# Patient Record
Sex: Male | Born: 1955 | Race: White | Hispanic: No | Smoking: Never smoker
Health system: Southern US, Community
[De-identification: ages and names within clinical notes are randomized; demographics above are authoritative.]

## PROBLEM LIST (undated history)

## (undated) DIAGNOSIS — J309 Allergic rhinitis, unspecified: Secondary | ICD-10-CM

## (undated) DIAGNOSIS — I639 Cerebral infarction, unspecified: Secondary | ICD-10-CM

## (undated) DIAGNOSIS — E669 Obesity, unspecified: Secondary | ICD-10-CM

## (undated) DIAGNOSIS — I10 Essential (primary) hypertension: Secondary | ICD-10-CM

## (undated) DIAGNOSIS — D6869 Other thrombophilia: Secondary | ICD-10-CM

## (undated) DIAGNOSIS — E782 Mixed hyperlipidemia: Secondary | ICD-10-CM

## (undated) DIAGNOSIS — I69359 Hemiplegia and hemiparesis following cerebral infarction affecting unspecified side: Secondary | ICD-10-CM

## (undated) DIAGNOSIS — I499 Cardiac arrhythmia, unspecified: Secondary | ICD-10-CM

## (undated) DIAGNOSIS — F32A Depression, unspecified: Secondary | ICD-10-CM

## (undated) DIAGNOSIS — I219 Acute myocardial infarction, unspecified: Secondary | ICD-10-CM

## (undated) DIAGNOSIS — E1140 Type 2 diabetes mellitus with diabetic neuropathy, unspecified: Secondary | ICD-10-CM

## (undated) DIAGNOSIS — I4891 Unspecified atrial fibrillation: Secondary | ICD-10-CM

## (undated) DIAGNOSIS — I480 Paroxysmal atrial fibrillation: Secondary | ICD-10-CM

## (undated) DIAGNOSIS — E876 Hypokalemia: Secondary | ICD-10-CM

## (undated) DIAGNOSIS — I4819 Other persistent atrial fibrillation: Secondary | ICD-10-CM

## (undated) DIAGNOSIS — I2510 Atherosclerotic heart disease of native coronary artery without angina pectoris: Secondary | ICD-10-CM

## (undated) DIAGNOSIS — C801 Malignant (primary) neoplasm, unspecified: Secondary | ICD-10-CM

## (undated) DIAGNOSIS — U071 COVID-19: Secondary | ICD-10-CM

## (undated) DIAGNOSIS — M1990 Unspecified osteoarthritis, unspecified site: Secondary | ICD-10-CM

## (undated) DIAGNOSIS — E7800 Pure hypercholesterolemia, unspecified: Secondary | ICD-10-CM

## (undated) DIAGNOSIS — K625 Hemorrhage of anus and rectum: Secondary | ICD-10-CM

## (undated) DIAGNOSIS — F419 Anxiety disorder, unspecified: Secondary | ICD-10-CM

## (undated) DIAGNOSIS — I5030 Unspecified diastolic (congestive) heart failure: Secondary | ICD-10-CM

## (undated) HISTORY — DX: Allergic rhinitis, unspecified: J30.9

## (undated) HISTORY — DX: Hemorrhage of anus and rectum: K62.5

## (undated) HISTORY — DX: Essential (primary) hypertension: I10

## (undated) HISTORY — DX: Other persistent atrial fibrillation: I48.19

## (undated) HISTORY — DX: Type 2 diabetes mellitus with diabetic neuropathy, unspecified: E11.40

## (undated) HISTORY — DX: Obesity, unspecified: E66.9

## (undated) HISTORY — PX: CORONARY ANGIOPLASTY: SHX604

## (undated) HISTORY — DX: Depression, unspecified: F32.A

## (undated) HISTORY — DX: Mixed hyperlipidemia: E78.2

## (undated) HISTORY — DX: COVID-19: U07.1

## (undated) HISTORY — DX: Hemiplegia and hemiparesis following cerebral infarction affecting unspecified side: I69.359

## (undated) HISTORY — DX: Other thrombophilia: D68.69

## (undated) HISTORY — DX: Acute myocardial infarction, unspecified: I21.9

## (undated) HISTORY — DX: Hypokalemia: E87.6

## (undated) HISTORY — DX: Malignant (primary) neoplasm, unspecified: C80.1

## (undated) HISTORY — DX: Anxiety disorder, unspecified: F41.9

## (undated) HISTORY — DX: Atherosclerotic heart disease of native coronary artery without angina pectoris: I25.10

## (undated) HISTORY — DX: Unspecified diastolic (congestive) heart failure: I50.30

## (undated) HISTORY — DX: Paroxysmal atrial fibrillation: I48.0

## (undated) HISTORY — DX: Unspecified osteoarthritis, unspecified site: M19.90

## (undated) HISTORY — DX: Pure hypercholesterolemia, unspecified: E78.00

---
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
## 2019-05-01 DIAGNOSIS — C189 Malignant neoplasm of colon, unspecified: Secondary | ICD-10-CM

## 2019-05-01 HISTORY — DX: Malignant neoplasm of colon, unspecified: C18.9

## 2019-06-02 ENCOUNTER — Other Ambulatory Visit: Payer: Self-pay | Admitting: General Surgery

## 2019-06-02 NOTE — Progress Notes (Signed)
errror

## 2019-06-04 ENCOUNTER — Emergency Department (HOSPITAL_COMMUNITY): Payer: Medicare HMO

## 2019-06-04 ENCOUNTER — Ambulatory Visit: Payer: Medicare HMO | Admitting: Physician Assistant

## 2019-06-04 ENCOUNTER — Encounter (HOSPITAL_COMMUNITY): Payer: Self-pay | Admitting: Cardiology

## 2019-06-04 ENCOUNTER — Other Ambulatory Visit: Payer: Self-pay

## 2019-06-04 ENCOUNTER — Inpatient Hospital Stay (HOSPITAL_COMMUNITY)
Admission: EM | Admit: 2019-06-04 | Discharge: 2019-06-06 | DRG: 246 | Disposition: A | Discharge status: Home / Self Care | Payer: Medicare HMO | Attending: Cardiology | Admitting: Cardiology

## 2019-06-04 ENCOUNTER — Encounter: Payer: Self-pay | Admitting: Physician Assistant

## 2019-06-04 ENCOUNTER — Other Ambulatory Visit: Payer: Medicare HMO

## 2019-06-04 ENCOUNTER — Encounter (HOSPITAL_COMMUNITY): Payer: Self-pay

## 2019-06-04 VITALS — HR 196 | Temp 98.2°F | Ht 68.0 in | Wt 215.5 lb

## 2019-06-04 DIAGNOSIS — Z8249 Family history of ischemic heart disease and other diseases of the circulatory system: Secondary | ICD-10-CM

## 2019-06-04 DIAGNOSIS — E785 Hyperlipidemia, unspecified: Secondary | ICD-10-CM

## 2019-06-04 DIAGNOSIS — Z79899 Other long term (current) drug therapy: Secondary | ICD-10-CM | POA: Diagnosis not present

## 2019-06-04 DIAGNOSIS — Z7901 Long term (current) use of anticoagulants: Secondary | ICD-10-CM | POA: Diagnosis not present

## 2019-06-04 DIAGNOSIS — R9431 Abnormal electrocardiogram [ECG] [EKG]: Secondary | ICD-10-CM

## 2019-06-04 DIAGNOSIS — K625 Hemorrhage of anus and rectum: Secondary | ICD-10-CM | POA: Diagnosis not present

## 2019-06-04 DIAGNOSIS — E782 Mixed hyperlipidemia: Secondary | ICD-10-CM | POA: Diagnosis present

## 2019-06-04 DIAGNOSIS — Z87891 Personal history of nicotine dependence: Secondary | ICD-10-CM

## 2019-06-04 DIAGNOSIS — I2510 Atherosclerotic heart disease of native coronary artery without angina pectoris: Secondary | ICD-10-CM | POA: Diagnosis present

## 2019-06-04 DIAGNOSIS — I472 Ventricular tachycardia, unspecified: Secondary | ICD-10-CM

## 2019-06-04 DIAGNOSIS — Z20822 Contact with and (suspected) exposure to covid-19: Secondary | ICD-10-CM | POA: Diagnosis present

## 2019-06-04 DIAGNOSIS — I480 Paroxysmal atrial fibrillation: Secondary | ICD-10-CM | POA: Diagnosis present

## 2019-06-04 DIAGNOSIS — Z955 Presence of coronary angioplasty implant and graft: Secondary | ICD-10-CM | POA: Diagnosis not present

## 2019-06-04 DIAGNOSIS — I214 Non-ST elevation (NSTEMI) myocardial infarction: Secondary | ICD-10-CM

## 2019-06-04 DIAGNOSIS — I255 Ischemic cardiomyopathy: Secondary | ICD-10-CM | POA: Diagnosis present

## 2019-06-04 DIAGNOSIS — I152 Hypertension secondary to endocrine disorders: Secondary | ICD-10-CM

## 2019-06-04 DIAGNOSIS — I252 Old myocardial infarction: Secondary | ICD-10-CM | POA: Diagnosis not present

## 2019-06-04 DIAGNOSIS — I25110 Atherosclerotic heart disease of native coronary artery with unstable angina pectoris: Secondary | ICD-10-CM | POA: Diagnosis not present

## 2019-06-04 DIAGNOSIS — E1159 Type 2 diabetes mellitus with other circulatory complications: Secondary | ICD-10-CM

## 2019-06-04 DIAGNOSIS — I4891 Unspecified atrial fibrillation: Secondary | ICD-10-CM

## 2019-06-04 DIAGNOSIS — R778 Other specified abnormalities of plasma proteins: Secondary | ICD-10-CM

## 2019-06-04 DIAGNOSIS — E1140 Type 2 diabetes mellitus with diabetic neuropathy, unspecified: Secondary | ICD-10-CM | POA: Diagnosis present

## 2019-06-04 DIAGNOSIS — I10 Essential (primary) hypertension: Secondary | ICD-10-CM | POA: Diagnosis not present

## 2019-06-04 DIAGNOSIS — R000 Tachycardia, unspecified: Secondary | ICD-10-CM

## 2019-06-04 HISTORY — DX: Tachycardia, unspecified: R00.0

## 2019-06-04 HISTORY — DX: Ventricular tachycardia: I47.2

## 2019-06-04 LAB — COMPREHENSIVE METABOLIC PANEL
ALT: 18 U/L (ref 0–44)
AST: 19 U/L (ref 15–41)
Albumin: 3.7 g/dL (ref 3.5–5.0)
Alkaline Phosphatase: 48 U/L (ref 38–126)
Anion gap: 13 (ref 5–15)
BUN: 24 mg/dL — ABNORMAL HIGH (ref 8–23)
CO2: 20 mmol/L — ABNORMAL LOW (ref 22–32)
Calcium: 9 mg/dL (ref 8.9–10.3)
Chloride: 107 mmol/L (ref 98–111)
Creatinine, Ser: 1.43 mg/dL — ABNORMAL HIGH (ref 0.61–1.24)
GFR calc Af Amer: 59 mL/min — ABNORMAL LOW (ref >60)
GFR calc non Af Amer: 51 mL/min — ABNORMAL LOW (ref >60)
Glucose, Bld: 227 mg/dL — ABNORMAL HIGH (ref 70–99)
Potassium: 3.8 mmol/L (ref 3.5–5.1)
Sodium: 140 mmol/L (ref 135–145)
Total Bilirubin: 1 mg/dL (ref 0.3–1.2)
Total Protein: 6.6 g/dL (ref 6.5–8.1)

## 2019-06-04 LAB — CBC
HCT: 36.6 % — ABNORMAL LOW (ref 39.0–52.0)
Hemoglobin: 12.1 g/dL — ABNORMAL LOW (ref 13.0–17.0)
MCH: 28.1 pg (ref 26.0–34.0)
MCHC: 33.1 g/dL (ref 30.0–36.0)
MCV: 84.9 fL (ref 80.0–100.0)
Platelets: 346 10*3/uL (ref 150–400)
RBC: 4.31 MIL/uL (ref 4.22–5.81)
RDW: 16.5 % — ABNORMAL HIGH (ref 11.5–15.5)
WBC: 12 10*3/uL — ABNORMAL HIGH (ref 4.0–10.5)
nRBC: 0 % (ref 0.0–0.2)

## 2019-06-04 LAB — HEMOGLOBIN A1C
Hgb A1c MFr Bld: 5.4 % (ref 4.8–5.6)
Mean Plasma Glucose: 108.28 mg/dL

## 2019-06-04 LAB — ECHOCARDIOGRAM COMPLETE
Height: 70 in
Weight: 3344 oz

## 2019-06-04 LAB — HIV ANTIBODY (ROUTINE TESTING W REFLEX): HIV Screen 4th Generation wRfx: NONREACTIVE

## 2019-06-04 LAB — MAGNESIUM: Magnesium: 2.1 mg/dL (ref 1.7–2.4)

## 2019-06-04 LAB — RESPIRATORY PANEL BY RT PCR (FLU A&B, COVID)
Influenza A by PCR: NEGATIVE
Influenza B by PCR: NEGATIVE
SARS Coronavirus 2 by RT PCR: NEGATIVE

## 2019-06-04 LAB — TROPONIN I (HIGH SENSITIVITY)
Troponin I (High Sensitivity): 35 ng/L — ABNORMAL HIGH (ref <18)
Troponin I (High Sensitivity): 362 ng/L (ref <18)

## 2019-06-04 LAB — TSH: TSH: 0.729 u[IU]/mL (ref 0.350–4.500)

## 2019-06-04 MED ORDER — SODIUM CHLORIDE 0.9 % IV SOLN: INTRAVENOUS | Status: DC

## 2019-06-04 MED ORDER — ASPIRIN 300 MG RE SUPP: 300.0000 mg | RECTAL | Status: DC

## 2019-06-04 MED ORDER — ASPIRIN 81 MG PO CHEW
324.0000 mg | CHEWABLE_TABLET | ORAL | Status: DC
  Filled 2019-06-04: qty 4

## 2019-06-04 MED ORDER — DILTIAZEM HCL-DEXTROSE 125-5 MG/125ML-% IV SOLN (PREMIX)
5.0000 mg/h | INTRAVENOUS | Status: DC
  Administered 2019-06-04: 14:00:00 5 mg/h via INTRAVENOUS
  Filled 2019-06-04: qty 125

## 2019-06-04 MED ORDER — SODIUM CHLORIDE 0.9% FLUSH
3.0000 mL | Freq: Two times a day (BID) | INTRAVENOUS | Status: DC
  Administered 2019-06-04: 21:00:00 3 mL via INTRAVENOUS

## 2019-06-04 MED ORDER — SODIUM CHLORIDE 0.9% FLUSH
3.0000 mL | Freq: Two times a day (BID) | INTRAVENOUS | Status: DC
  Administered 2019-06-05: 21:00:00 3 mL via INTRAVENOUS

## 2019-06-04 MED ORDER — ASPIRIN 81 MG PO CHEW
81.0000 mg | CHEWABLE_TABLET | Freq: Every day | ORAL | Status: DC
  Administered 2019-06-05 – 2019-06-06 (×2): 81 mg via ORAL
  Filled 2019-06-04 (×2): qty 1

## 2019-06-04 MED ORDER — ONDANSETRON HCL 4 MG/2ML IJ SOLN: 4.0000 mg | Freq: Four times a day (QID) | INTRAMUSCULAR | Status: DC | PRN

## 2019-06-04 MED ORDER — LISINOPRIL 2.5 MG PO TABS
2.5000 mg | ORAL_TABLET | Freq: Every day | ORAL | Status: DC
  Administered 2019-06-05 – 2019-06-06 (×2): 2.5 mg via ORAL
  Filled 2019-06-04 (×2): qty 1

## 2019-06-04 MED ORDER — DILTIAZEM LOAD VIA INFUSION
10.0000 mg | Freq: Once | INTRAVENOUS | Status: AC
  Administered 2019-06-04: 14:00:00 10 mg via INTRAVENOUS
  Filled 2019-06-04: qty 10

## 2019-06-04 MED ORDER — SODIUM CHLORIDE 0.9 % IV SOLN: 250.0000 mL | INTRAVENOUS | Status: DC | PRN

## 2019-06-04 MED ORDER — ZOLPIDEM TARTRATE 5 MG PO TABS
5.0000 mg | ORAL_TABLET | Freq: Every evening | ORAL | Status: DC | PRN
  Administered 2019-06-04 – 2019-06-05 (×2): 5 mg via ORAL
  Filled 2019-06-04 (×2): qty 1

## 2019-06-04 MED ORDER — ALPRAZOLAM 0.25 MG PO TABS
0.2500 mg | ORAL_TABLET | Freq: Two times a day (BID) | ORAL | Status: DC | PRN
  Administered 2019-06-05: 12:00:00 0.25 mg via ORAL
  Filled 2019-06-04: qty 1

## 2019-06-04 MED ORDER — EZETIMIBE 10 MG PO TABS
10.0000 mg | ORAL_TABLET | Freq: Every day | ORAL | Status: DC
  Administered 2019-06-04 – 2019-06-05 (×2): 10 mg via ORAL
  Filled 2019-06-04 (×2): qty 1

## 2019-06-04 MED ORDER — NITROGLYCERIN 0.4 MG SL SUBL: 0.4000 mg | SUBLINGUAL_TABLET | SUBLINGUAL | Status: DC | PRN

## 2019-06-04 MED ORDER — ACETAMINOPHEN 325 MG PO TABS: 650.0000 mg | ORAL_TABLET | ORAL | Status: DC | PRN

## 2019-06-04 MED ORDER — METOPROLOL TARTRATE 50 MG PO TABS
50.0000 mg | ORAL_TABLET | Freq: Two times a day (BID) | ORAL | Status: DC
  Administered 2019-06-04 – 2019-06-06 (×4): 50 mg via ORAL
  Filled 2019-06-04 (×4): qty 1

## 2019-06-04 MED ORDER — SODIUM CHLORIDE 0.9% FLUSH: 3.0000 mL | INTRAVENOUS | Status: DC | PRN

## 2019-06-04 MED ORDER — TICAGRELOR 90 MG PO TABS
90.0000 mg | ORAL_TABLET | Freq: Two times a day (BID) | ORAL | Status: DC
  Administered 2019-06-04 – 2019-06-06 (×4): 90 mg via ORAL
  Filled 2019-06-04 (×4): qty 1

## 2019-06-04 MED ORDER — ROSUVASTATIN CALCIUM 20 MG PO TABS
40.0000 mg | ORAL_TABLET | Freq: Every day | ORAL | Status: DC
  Administered 2019-06-04 – 2019-06-05 (×2): 40 mg via ORAL
  Filled 2019-06-04 (×2): qty 2

## 2019-06-04 NOTE — Plan of Care (Signed)
  Problem: Education: Goal: Knowledge of General Education information will improve Description Including pain rating scale, medication(s)/side effects and non-pharmacologic comfort measures Outcome: Progressing   

## 2019-06-04 NOTE — ED Triage Notes (Signed)
Pt BIB [facility] from GI office following a run of v tach, EMS called and pt received 150 amiodarone. Pt reports 1 hr of chest pressure/discomfort, hx of MI in December, 3 stents placed. Pt A&Ox4, NAD noted.  HR 110-120's, BP 112/76, 96% on RA, RR15.

## 2019-06-04 NOTE — Progress Notes (Signed)
  Echocardiogram 2D Echocardiogram has been performed.  John Douglas 06/04/2019, 3:32 PM

## 2019-06-04 NOTE — ED Notes (Signed)
Cards paged to [id number] Raquel Sarna, RN paged by Levada Dy

## 2019-06-04 NOTE — Progress Notes (Signed)
Subjective:    Patient ID: John Douglas, male    DOB: 1955-08-04, 64 y.o.   MRN: [id number]  HPI John Douglas is a pleasant 64 year old white male, new to GI today referred by Lorenda Hatchet, MD/PCP for evaluation of diarrhea and rectal bleeding. Patient has not had any prior GI evaluation. Patient had an STEMI on 04/28/2019 and was cared for by a Dr. Marylen Ponto at [facility].  He was noted found on cath to have severe LAD stenosis and had 3 drug-eluting stents placed.  He was noted to have residual disease in the left circumflex and RCA.  He has been on Brilinta and Eliquis. Also diagnosed with A. fib at that same time and echo showed EF of 45%.  He has history of hypertension and adult onset diabetes mellitus. Patient says he started having problems with diarrhea about a year and a half ago when he was on Metformin which was eventually stopped.  He has been off metformin since June.  He has been noting blood mixed in with his bowel movements off and on over the past 6 or 7 months says he is continued to have loose stools over the past several months.  He has had worsening of the diarrhea and increasing amount of blood in the stools over the past month since being placed on blood thinners.  He says he is having at least 7-8 bowel movements per day, often getting up at night for bowel movements and planes of abdominal gassiness and discomfort but no severe pain or cramping.  He is seeing red blood mixed in with almost every bowel movement. Labs were done on 05/28/2019 showing hemoglobin of 11.7.  On arrival to the office today patient found to be hypotensive with systolic blood pressure [id number].  Pulse irregular and very tachycardic with rate about 180 by my exam.  He was initially reluctant to sit or hospitalization.  On further questioning he did admit to feeling somewhat short of breath this morning and may have had a twinge of chest discomfort.  Eventually he was able to be convinced to be transported to  [facility] via EMS for further evaluation.    Review of Systems Pertinent positive and negative review of systems were noted in the above HPI section.  All other review of systems was otherwise negative.  Outpatient Encounter Medications as of 06/04/2019  Medication Sig  . apixaban (ELIQUIS) 5 MG TABS tablet Take 5 mg by mouth 2 (two) times daily.  Marland Kitchen ezetimibe (ZETIA) 10 MG tablet Take 10 mg by mouth at bedtime.  Marland Kitchen lisinopril (ZESTRIL) 2.5 MG tablet Take 2.5 mg by mouth daily.  . metoprolol tartrate (LOPRESSOR) 50 MG tablet Take 50 mg by mouth 2 (two) times daily.  . nitroGLYCERIN (NITROSTAT) 0.4 MG SL tablet Place under the tongue.  . rosuvastatin (CRESTOR) 40 MG tablet Take 40 mg by mouth at bedtime.  . ticagrelor (BRILINTA) 90 MG TABS tablet Take 90 mg by mouth 2 (two) times daily.   No facility-administered encounter medications on file as of 06/04/2019.   Allergies  Allergen Reactions  . Penicillins     As a child   There are no problems to display for this patient.  Social History   Socioeconomic History  . Marital status: Single    Spouse name: Not on file  . Number of children: 2  . Years of education: Not on file  . Highest education level: Not on file  Occupational History  . Occupation: retired  Tobacco Use  . Smoking status: Never Smoker  . Smokeless tobacco: Former Systems developer    Types: Chew  Substance and Sexual Activity  . Alcohol use: Not Currently    Comment: occasional  . Drug use: Not Currently    Types: Marijuana  . Sexual activity: Not on file  Other Topics Concern  . Not on file  Social History Narrative   Married with 2 adult children   Retired worked for [employer]:   . Difficulty of Paying Living Expenses: Not on file  Food Insecurity:   . Worried About Charity fundraiser in the Last Year: Not on file  . Ran Out of Food in the Last Year: Not on file  Transportation Needs:   .  Lack of Transportation (Medical): Not on file  . Lack of Transportation (Non-Medical): Not on file  Physical Activity:   . Days of Exercise per Week: Not on file  . Minutes of Exercise per Session: Not on file  Stress:   . Feeling of Stress : Not on file  Social Connections:   . Frequency of Communication with Friends and Family: Not on file  . Frequency of Social Gatherings with Friends and Family: Not on file  . Attends Religious Services: Not on file  . Active Member of Clubs or Organizations: Not on file  . Attends Archivist Meetings: Not on file  . Marital Status: Not on file  Intimate Partner Violence:   . Fear of Current or Ex-Partner: Not on file  . Emotionally Abused: Not on file  . Physically Abused: Not on file  . Sexually Abused: Not on file    [name] family history includes Heart disease in his father; Hypertension in his father; Prostate cancer in his father.      Objective:    Vitals:   06/04/19 0922  Pulse: (!) 196  Temp: 98.2 F (36.8 C)    Physical Exam Well-developed well-nourished older WM, pale , conversant .  Height, Weight 209, BMI 29.9  HEENT; nontraumatic normocephalic, EOMI, PER R LA, sclera anicteric. Oropharynx; not examined Neck; supple, no JVD Cardiovascular; very tachycardic rate in the 180s irregular  rate and rhythm with S1-S2, no murmur rub or gallop      PB 78 systolic Pulmonary; Clear bilaterally Abdomen; soft, nontender, nondistended, no palpable mass or hepatosplenomegaly, bowel sounds are active Rectal; not done today Skin; benign exam, no jaundice rash or appreciable lesions Extremities; no clubbing cyanosis or edema skin warm and dry Neuro/Psych; alert and oriented x4, grossly nonfocal mood and affect appropriate       Assessment & Plan:   #14 64 year old white male status post STEMI 04/28/2019, patient was hospitalized at [facility], had 3 stents placed to the LAD and noted to have residual disease in  the left circumflex and RCA.  He has been on Brilinta and Eliquis  #2 hypotension and tachy  arrhythmia-question rapid A. Fib precipitated by GI bleeding, volume depletion versus cardiac  #3 history of atrial fibrillation 4.  Adult onset diabetes mellitus  #5 several month history of loose stools and frequent hematochezia with increase in diarrhea currently having 7 or 8 bowel movements per day and frequent nocturnal need for diarrhea.  He has been seeing bright red blood mixed with each bowel movement.  Etiology not clear, suspect he has been having ongoing slow GI bleeding, concerned he may be more anemic precipitating  hypotension etc. Rule out underlying colitis, versus malignancy  Plan; patient transported to [facility] via EMS.  He will need further cardiac evaluation Patient needs to have colonoscopy, which will be challenging in the setting with anticoagulation and very recent MI.  He will l benefit from admission, so that he can be carefully monitored , cardiology consultation, and have GI consultation with [place] to consider colonoscopy with bridging, versus imaging.  I will communicate with our inpatient team for GI.  Rocio Roam S Maximillion Gill PA-C 06/04/2019   Cc: Hamrick, Lorin Mercy, MD

## 2019-06-04 NOTE — H&P (Addendum)
Cardiology Consultation:   Patient ID: John Douglas MRN: [id number]; DOB: [birth year]  Admit date: 06/04/2019 Date of Consult: 06/04/2019  Primary Care Provider: Leonides Sake, MD Primary Cardiologist: No primary care provider on file.  Primary Electrophysiologist:  None    Patient Profile:   John Douglas is a 64 y.o. male with a hx of hypertension, hyperlipidemia, A. fib, prediabetes, obesity, cardiomyopathy (EF 45%), and recent STEMI with CAD s/p PCI DES x 3 LAD 03/2019 who is being seen today for the evaluation of VT and irregular heartbeat at the request of Dr. Rex Kras.  History of Present Illness:   Mr. Knipe has not been seen by heart care in the past. He was recently seen by cardiology at [facility]. On 12/29 he presented with a STEMI at [place] and found to have 100% RCA stenosis with residual disease in the LCx and LAD.  He received PCI x 3 to the RCA with residual disease with medical management.  Echo showed EF of 45%, akinesis of the inferior wall, left atrium mildly dilated, mildly reduced RV function. he was started on Brilinta given that he was already on Eliquis for A. Fib.   The patient was seen today in an outpatient GI follow-up for bright red blood in the stool.  He was noted to be hypotensive and tachycardic, suspected A. Fib. He said he had mild chest discomfort 1/10 and was diaphoretic. No SOB.  Patient was transferred to [facility] via EMS for irregular heart rhythm. On route to the ED was noted to be a wide-complex cardia (possibly VT) HR 180s and was started on amiodarone infusion.  In the ED he appeared to be in A. fib RVR.  In the ED blood pressure 122/76, pulse 125, afebrile, respiratory rate 20, 97% O2.  Labs show potassium 3.8, sodium 140, CO2 20, glucose 227, creatinine 1.43, BUN 24.  LFTs wnl.  WBC 12 hemoglobin 12.1. HS troponin 35.  Covid pending.  Chest x-ray non-acute. EKG shows Afib RVR, 116 bpm, with TWI inferolateral leads which could also be repol abnormalities  given rate.   Patient is currently chest pain free. He smokes pot every day. He used to chew tobacco (quit 1 month ago). No alcohol use. The patient noted that he started noticing BRBPR when he started taking metformin for his diabetes a couple years ago. He stopped his metformin in July 2020 and his bleeding decreased. Since he was discharged from [facility] from recent STEMI on Eliquis and Brilinta he has had intermittent BRBPR. Labs in care everywhere show Hgb on 12/31 were 13.5. Today he is 12.1. Denies weakness or fatigue.    Heart Pathway Score:     Past Medical History:  Diagnosis Date   Allergic rhinitis    [facility]   Anxiety    Atrial fibrillation ([place])    [facility]   Benign essential hypertension    [facility]   Coronary artery disease    [facility]   Diabetic neuropathy, type II diabetes mellitus ([place])    [facility]   Mixed hyperlipidemia    [facility]   Myocardial infarct [facility])    [place]   Obesity, unspecified    [facility]   Rectal bleeding    [facility]    Past Surgical History:  Procedure Laterality Date   CORONARY ANGIOPLASTY     3 stents  Home Medications:  Prior to Admission medications   Medication Sig Start Date End Date Taking? Authorizing Provider  apixaban (ELIQUIS) 5 MG TABS tablet Take 5 mg by mouth 2 (two) times daily. 05/25/19 08/23/19  [provider]  ezetimibe (ZETIA) 10 MG tablet Take 10 mg by mouth at bedtime. 05/25/19   [provider]  lisinopril (ZESTRIL) 2.5 MG tablet Take 2.5 mg by mouth daily. 05/25/19   [provider]  metoprolol tartrate (LOPRESSOR) 50 MG tablet Take 50 mg by mouth 2 (two) times daily. 05/25/19   [provider]  nitroGLYCERIN (NITROSTAT) 0.4 MG SL tablet Place under the tongue. 05/25/19 05/24/20   [provider]  rosuvastatin (CRESTOR) 40 MG tablet Take 40 mg by mouth at bedtime. 05/25/19   [provider]  ticagrelor (BRILINTA) 90 MG TABS tablet Take 90 mg by mouth 2 (two) times daily. 05/25/19 06/24/19  [provider]    Inpatient Medications: Scheduled Meds:  diltiazem  10 mg Intravenous Once   Continuous Infusions:  diltiazem (CARDIZEM) infusion     PRN Meds:   Allergies:    Allergies  Allergen Reactions   Penicillins     As a child    Social History:   Social History   Socioeconomic History   Marital status: Single    Spouse name: Not on file   Number of children: 2   Years of education: Not on file   Highest education level: Not on file  Occupational History   Occupation: retired  Tobacco Use   Smoking status: Never Smoker   Smokeless tobacco: Former Systems developer    Types: Camera operator and Sexual Activity   Alcohol use: Not Currently    Comment: occasional   Drug use: Not Currently    Types: Marijuana   Sexual activity: Not on file  Other Topics Concern   Not on file  Social History Narrative   Married with 2 adult children   Retired worked for [employer] Strain:    Difficulty of Paying Living Expenses: Not on file  Food Insecurity:    Worried About Charity fundraiser in the Last Year: Not on file   [place] in the Last Year: Not on file  Transportation Needs:    Film/video editor (Medical): Not on file   Lack of Transportation (Non-Medical): Not on file  Physical Activity:    Days of Exercise per Week: Not on file   Minutes of Exercise per Session: Not on file  Stress:    Feeling of Stress : Not on file  Social Connections:    Frequency of Communication with Friends and Family: Not on file   Frequency of Social Gatherings with Friends and Family: Not on file   Attends Religious Services: Not on file   Active Member of Clubs or Organizations: Not  on file   Attends Archivist Meetings: Not on file   Marital Status: Not on file  Intimate Partner Violence:    Fear of Current or Ex-Partner: Not on file   Emotionally Abused: Not on file   Physically Abused: Not on file   Sexually Abused: Not on file    Family History:   Family History  Problem Relation Age of Onset   Hypertension Father    Heart disease Father        MI   Prostate cancer Father      ROS:  Please see the history of present illness.  All other ROS reviewed and negative.     Physical Exam/Data:   Vitals:   06/04/19 1124 06/04/19 1130 06/04/19 1200 06/04/19 1230  BP:  106/81 112/78 110/71  Pulse:  90 86 (!) 111  Resp:  12 16 14   Temp:      TempSrc:      SpO2: 96% 97% 96% 97%  Weight: 94.8 kg     Height: 5\' 10"  (1.778 m)      No intake or output data in the 24 hours ending 06/04/19 1301 Last 3 Weights 06/04/2019 06/04/2019  Weight (lbs) 209 lb 215 lb 8 oz  Weight (kg) 94.802 kg 97.75 kg     Body mass index is 29.99 kg/m.  General:  Well nourished, well developed, in no acute distress HEENT: normal Lymph: no adenopathy Neck: no JVD Endocrine:  No thryomegaly Vascular: No carotid bruits; FA pulses 2+ bilaterally without bruits  Cardiac:  normal S1, S2; RRR; no murmur  Lungs:  clear to auscultation bilaterally, no wheezing, rhonchi or rales  Abd: soft, nontender, no hepatomegaly  Ext: no edema Musculoskeletal:  No deformities, BUE and BLE strength normal and equal Skin: warm and dry  Neuro:  CNs 2-12 intact, no focal abnormalities noted Psych:  Normal affect   EKG:  The EKG was personally reviewed and demonstrates:  Afib RVR, 116 bpm, TWI inferolateral leads possible repol abnormalities given rate Telemetry:  Telemetry was personally reviewed and demonstrates:  Afib Rates 120s which have slowly improved to around 100.   Relevant CV Studies:  Echo 03/2019 1. The left ventricle is normal in size with normal wall thickness. 2. The left  ventricular systolic function is mildly decreased, LVEF is visually estimated at 45%. 3. There is akinesis of the inferior wall. 4. The left atrium is mildly dilated in size. 5. The aortic valve is trileaflet with mildly thickened leaflets with normal excursion. 6. The right ventricle is mildly dilated in size, with mildly reduced systolic function. 7. The right atrium is mildly dilated in size. 8. Technically difficult study. 9. Echo contrast utilized to enhance endocardial border definition.   LHC LMCA. There is a 25% stenosis in the distal left main coronary artery.   LAD: The LAD is a large-caliber vessel that gives off 2 major diagonal (D) branches before it wraps around the apex. D1 is a moderate vessel with mild disease. D2 is a moderate vessel at the bifurcation with a 90% stenosis in the mid LAD. The proximal LAD has 25% stenosis at the bifurcation with the distal left main. The mid LAD has serial 90% lesions with mild calcification. The distal LAD has mild disease.    Left Circumflex: The LCx is a large-caliber vessel that gives off 4 obtuse marginal (OM) branches and then continues as a small vessel in the AV groove. OM1 is a large-caliber vessel with a bifurcation at the 75% proximal left circumflex lesion. There is moderate disease at the proximal portion of OM1. OM 2 and OM 3 are small vessels. OM 4 is a moderate vessel with mild disease throughout. The continuation of the left circumflex is small as it runs in the AV groove and supplies some left-to-right collaterals to the RCA.   Right Coronary: The right coronary artery is 100% occluded proximally.   Laboratory Data:  High Sensitivity Troponin:   Recent Labs  Lab 06/04/19 1118  TROPONINIHS 35*     Chemistry Recent Labs  Lab 06/04/19 1118  NA 140  K 3.8  CL 107  CO2 20*  GLUCOSE 227*  BUN 24*  CREATININE 1.43*  CALCIUM 9.0  GFRNONAA 51*  GFRAA 59*  ANIONGAP 13    Recent Labs  Lab 06/04/19 1118  PROT  6.6  ALBUMIN 3.7  AST 19  ALT 18  ALKPHOS 48  BILITOT 1.0   Hematology Recent Labs  Lab 06/04/19 1118  WBC 12.0*  RBC 4.31  HGB 12.1*  HCT 36.6*  MCV 84.9  MCH 28.1  MCHC 33.1  RDW 16.5*  PLT 346   BNPNo results for input(s): BNP, PROBNP in the last 168 hours.  DDimer No results for input(s): DDIMER in the last 168 hours.   Radiology/Studies:  DG Chest Port 1 View  Result Date: 06/04/2019 CLINICAL DATA:  Atrial fibrillation. EXAM: PORTABLE CHEST 1 VIEW COMPARISON:  Single-view of the chest 04/28/2019. FINDINGS: The lungs are clear. Heart size is normal. No pneumothorax or pleural fluid. No acute or focal bony abnormality. IMPRESSION: Negative chest. Electronically Signed   By: Inge Rise M.D.   On: 06/04/2019 12:23    Assessment and Plan:   Ventricular tachycardia - Patient found to be in wide complex tachycardia likely VT Hr 180s at his OP appointment. EMS was called and gave amio infusion and he converted to Afib RVR. - Will admit to cardiac telemetry - STAT echo to look for any changes in EF or wall motion. Echo at [facility] showed EF 45%  - Monitor for VT reoccurrence - HS troponin 35>> Continue to trend - Electrolytes stable - If patient has reoccurrence of VT he will need a cath and EP consult. NPO at midnight for possible cath. Hold Eliquis  CAD s/p/ DES x 3 LAD - recent stenting at [facility] - patient had initial chest discomfort which has since resolved - HS troponin 35, continue to trend. Patient might need repeat cath as above - continue Brilinta  Afib RVR, now rate controlled - patient has known h/o of afib on Eliquis>>hold eloquis for possible cath - He is currently rate controlled, Hr 90-100. Continue home BB - CHADSVASC= 4 (HTN, h/o DM, CAD, age)  - Hold on IV dilt  Rectal bleeding - reported BRBPR intermittently, apparently has been a chronic problem - Patient on Eliquis and Brilinta>> will hold Eliquis and continue Brilinta - Likely needs colonoscopy  per GI note. Apparently the patient was waiting to here back form cards regarding holding his blood thinner for procedure - daily CBC  HLD - continue statin  HTN - on lisinopril and Lopressor at baseline  For questions or updates, please contact [place] Please consult [email or web address] for contact info under     Signed, Jaire Pinkham Ninfa Meeker, PA-C  06/04/2019 1:01 PM

## 2019-06-04 NOTE — ED Provider Notes (Addendum)
[facility]ERGENCY DEPARTMENT Provider Note   CSN: [id number] Arrival date & time: 06/04/19  1114     History Chief Complaint  Patient presents with  . Irregular Heart Beat    converted v tach    John Douglas is a 64 y.o. male.  HPI  64 yo male at GI appointment, had some chest discomfort, diaphoresis, and noted to have irregular hb by fit bit, ems arrived and noted in wide complex tachycardia.  Amiodarone infusing and patient now in a fib rvr, chest discomfort resolved.  HO MI 04/29/19  To right coronary artery and multiple stents placed at [facility].  Now on anticoagulation and having some rectal bleeding Strips reviewed and initial relieved with wide-complex tachycardia at rate approximately 180.  Twelve-lead with wide-complex tachycardia but may be aberrant repolarization      Past Medical History:  Diagnosis Date  . Allergic rhinitis    [facility]  . Anxiety   . Atrial fibrillation ([place])    [facility]  . Benign essential hypertension    [facility]  . Coronary artery disease    [facility]  . Diabetic neuropathy, type II diabetes mellitus ([place])    [facility]  . Mixed hyperlipidemia    [facility]  . Myocardial infarct [facility])    [facility]  . Obesity, unspecified    [facility]  . Rectal bleeding     [facility]    There are no problems to display for this patient.   Past Surgical History:  Procedure Laterality Date  . CORONARY ANGIOPLASTY     3 stents       Family History  Problem Relation Age of Onset  . Hypertension Father   . Heart disease Father        MI  . Prostate cancer Father     Social History   Tobacco Use  . Smoking status: Never Smoker  . Smokeless tobacco: Former Systems developer    Types: Chew  Substance Use Topics  . Alcohol use: Not  Currently    Comment: occasional  . Drug use: Not Currently    Types: Marijuana    Home Medications Prior to Admission medications   Medication Sig Start Date End Date Taking? Authorizing Provider  apixaban (ELIQUIS) 5 MG TABS tablet Take 5 mg by mouth 2 (two) times daily. 05/25/19 08/23/19  [provider]  ezetimibe (ZETIA) 10 MG tablet Take 10 mg by mouth at bedtime. 05/25/19   [provider]  lisinopril (ZESTRIL) 2.5 MG tablet Take 2.5 mg by mouth daily. 05/25/19   [provider]  metoprolol tartrate (LOPRESSOR) 50 MG tablet Take 50 mg by mouth 2 (two) times daily. 05/25/19   [provider]  nitroGLYCERIN (NITROSTAT) 0.4 MG SL tablet Place under the tongue. 05/25/19 05/24/20  [provider]  rosuvastatin (CRESTOR) 40 MG tablet Take 40 mg by mouth at bedtime. 05/25/19   [provider]  ticagrelor (BRILINTA) 90 MG TABS tablet Take 90 mg by mouth 2 (two) times daily. 05/25/19 06/24/19  [provider]    Allergies    Penicillins  Review of Systems   Review of Systems  All other systems reviewed and are negative.   Physical Exam Updated Vital Signs BP 122/76 (BP Location: Right Arm)   Pulse (!) 125   Temp 97.8 F (36.6 C) (Oral)   Resp 20  SpO2 97%   Physical Exam Vitals and nursing note reviewed.  Constitutional:      General: He is not in acute distress.    Appearance: Normal appearance. He is not ill-appearing.  HENT:     Head: Normocephalic and atraumatic.     Right Ear: External ear normal.     Left Ear: External ear normal.     Nose: Nose normal.  Eyes:     Pupils: Pupils are equal, round, and reactive to light.  Cardiovascular:     Rate and Rhythm: Regular rhythm. Tachycardia present.     Pulses: Normal pulses.  Pulmonary:     Effort: Pulmonary effort is normal.     Breath sounds: Normal breath sounds.  Abdominal:     General: Abdomen is flat. Bowel sounds are normal.     Palpations: Abdomen is  soft.  Musculoskeletal:        General: Normal range of motion.     Cervical back: Normal range of motion.  Skin:    General: Skin is warm and dry.     Capillary Refill: Capillary refill takes less than 2 seconds.  Neurological:     General: No focal deficit present.     Mental Status: He is alert and oriented to person, place, and time.  Psychiatric:        Mood and Affect: Mood normal.     ED Results / Procedures / Treatments   Labs (all labs ordered are listed, but only abnormal results are displayed) Labs Reviewed - No data to display  EKG EKG Interpretation  Date/Time:  Thursday June 04 2019 11:15:14 EST Ventricular Rate:  116 PR Interval:    QRS Duration: 78 QT Interval:  339 QTC Calculation: 471 R Axis:   -4 Text Interpretation: Atrial fibrillation Repol abnrm, severe global ischemia (LM/MVD) Baseline wander in lead(s) V1 V2 No old tracing to compare Confirmed by Pattricia Boss [phone]) on 06/04/2019 11:26:58 AM   Radiology DG Chest Port 1 View  Result Date: 06/04/2019 CLINICAL DATA:  Atrial fibrillation. EXAM: PORTABLE CHEST 1 VIEW COMPARISON:  Single-view of the chest 04/28/2019. FINDINGS: The lungs are clear. Heart size is normal. No pneumothorax or pleural fluid. No acute or focal bony abnormality. IMPRESSION: Negative chest. Electronically Signed   By: Inge Rise M.D.   On: 06/04/2019 12:23    Procedures .Critical Care Performed by: Pattricia Boss, MD Authorized by: Pattricia Boss, MD   Critical care provider statement:    Critical care time (minutes):  45   Critical care end time:  06/04/2019 2:16 PM   Critical care was necessary to treat or prevent imminent or life-threatening deterioration of the following conditions:  Circulatory failure   Critical care was time spent personally by me on the following activities:  Discussions with consultants, evaluation of patient's response to treatment, examination of patient, ordering and performing treatments and  interventions, ordering and review of laboratory studies, ordering and review of radiographic studies, pulse oximetry, re-evaluation of patient's condition, obtaining history from patient or surrogate and review of old charts   (including critical care time)  Medications Ordered in ED Medications - No data to display  ED Course  I have reviewed the triage vital signs and the nursing notes.  Pertinent labs & imaging results that were available during my care of the patient were reviewed by me and considered in my medical decision making (see chart for details).    MDM Rules/Calculators/A&P  64 year old male recent STEMI presents today with episode of wide-complex tachycardia resolved prehospital with amiodarone by EMS.  Here he is in A. fib with RVR.  Rate has decreased to the 90s without intervention.  Cardiology consulted.  They are seeing and plan to admit.  He has EKG here with T wave inversion nonspecific ST changes.  Initial troponin is slightly elevated.  He is not having any symptoms here. Patient was being evaluated today for GI bleeding.  Here his hemoglobin is stable.  No evidence of active bleeding. Final Clinical Impression(s) / ED Diagnoses Final diagnoses:  Wide-complex tachycardia ([place])  Abnormal EKG  Elevated troponin    Rx / DC Orders ED Discharge Orders    None       Pattricia Boss, MD 06/04/19 1416    Pattricia Boss, MD 06/04/19 1416

## 2019-06-04 NOTE — Patient Instructions (Addendum)
If you are age 66 or older, your body mass index should be between 23-30. Your Body mass index is 32.77 kg/m. If this is out of the aforementioned range listed, please consider follow up with your Primary Care Provider.  If you are age 31 or younger, your body mass index should be between 19-25. Your Body mass index is 32.77 kg/m. If this is out of the aformentioned range listed, please consider follow up with your Primary Care Provider.     Due to recent changes in healthcare laws, you may see the results of your imaging and laboratory studies on MyChart before your provider has had a chance to review them.  We understand that in some cases there may be results that are confusing or concerning to you. Not all laboratory results come back in the same time frame and the provider may be waiting for multiple results in order to interpret others.  Please give [place] 48 hours in order for your provider to thoroughly review all the results before contacting the office for clarification of your results.

## 2019-06-05 ENCOUNTER — Encounter (HOSPITAL_COMMUNITY)
Admission: EM | Disposition: A | Discharge status: Home / Self Care | Payer: Self-pay | Source: Home / Self Care | Attending: Cardiology

## 2019-06-05 DIAGNOSIS — I25110 Atherosclerotic heart disease of native coronary artery with unstable angina pectoris: Secondary | ICD-10-CM

## 2019-06-05 DIAGNOSIS — I480 Paroxysmal atrial fibrillation: Secondary | ICD-10-CM

## 2019-06-05 DIAGNOSIS — I2510 Atherosclerotic heart disease of native coronary artery without angina pectoris: Secondary | ICD-10-CM

## 2019-06-05 DIAGNOSIS — I10 Essential (primary) hypertension: Secondary | ICD-10-CM

## 2019-06-05 DIAGNOSIS — I214 Non-ST elevation (NSTEMI) myocardial infarction: Secondary | ICD-10-CM

## 2019-06-05 HISTORY — PX: LEFT HEART CATH AND CORONARY ANGIOGRAPHY: CATH118249

## 2019-06-05 LAB — POCT ACTIVATED CLOTTING TIME
Activated Clotting Time: 224 seconds
Activated Clotting Time: 224 seconds
Activated Clotting Time: 257 seconds
Activated Clotting Time: 263 seconds

## 2019-06-05 LAB — CBC
HCT: 34.7 % — ABNORMAL LOW (ref 39.0–52.0)
Hemoglobin: 11.4 g/dL — ABNORMAL LOW (ref 13.0–17.0)
MCH: 27.9 pg (ref 26.0–34.0)
MCHC: 32.9 g/dL (ref 30.0–36.0)
MCV: 85 fL (ref 80.0–100.0)
Platelets: 291 10*3/uL (ref 150–400)
RBC: 4.08 MIL/uL — ABNORMAL LOW (ref 4.22–5.81)
RDW: 16.7 % — ABNORMAL HIGH (ref 11.5–15.5)
WBC: 12.4 10*3/uL — ABNORMAL HIGH (ref 4.0–10.5)
nRBC: 0 % (ref 0.0–0.2)

## 2019-06-05 LAB — LIPID PANEL
Cholesterol: 71 mg/dL (ref 0–200)
HDL: 21 mg/dL — ABNORMAL LOW (ref >40)
LDL Cholesterol: 30 mg/dL (ref 0–99)
Total CHOL/HDL Ratio: 3.4 RATIO
Triglycerides: 99 mg/dL (ref <150)
VLDL: 20 mg/dL (ref 0–40)

## 2019-06-05 LAB — BASIC METABOLIC PANEL
Anion gap: 11 (ref 5–15)
BUN: 20 mg/dL (ref 8–23)
CO2: 22 mmol/L (ref 22–32)
Calcium: 9.2 mg/dL (ref 8.9–10.3)
Chloride: 111 mmol/L (ref 98–111)
Creatinine, Ser: 1.11 mg/dL (ref 0.61–1.24)
GFR calc Af Amer: >60 mL/min (ref >60)
GFR calc non Af Amer: >60 mL/min (ref >60)
Glucose, Bld: 148 mg/dL — ABNORMAL HIGH (ref 70–99)
Potassium: 3.7 mmol/L (ref 3.5–5.1)
Sodium: 144 mmol/L (ref 135–145)

## 2019-06-05 SURGERY — LEFT HEART CATH AND CORONARY ANGIOGRAPHY
Anesthesia: LOCAL

## 2019-06-05 MED ORDER — LIDOCAINE HCL (PF) 1 % IJ SOLN
INTRAMUSCULAR | Status: DC | PRN
  Administered 2019-06-05: 2 mL

## 2019-06-05 MED ORDER — SODIUM CHLORIDE 0.9% FLUSH: 3.0000 mL | Freq: Two times a day (BID) | INTRAVENOUS | Status: DC

## 2019-06-05 MED ORDER — HYDRALAZINE HCL 20 MG/ML IJ SOLN: 10.0000 mg | INTRAMUSCULAR | Status: AC | PRN

## 2019-06-05 MED ORDER — SODIUM CHLORIDE 0.9% FLUSH: 3.0000 mL | INTRAVENOUS | Status: DC | PRN

## 2019-06-05 MED ORDER — HEPARIN SODIUM (PORCINE) 1000 UNIT/ML IJ SOLN
INTRAMUSCULAR | Status: AC
  Filled 2019-06-05: qty 1

## 2019-06-05 MED ORDER — FENTANYL CITRATE (PF) 100 MCG/2ML IJ SOLN
INTRAMUSCULAR | Status: DC | PRN
  Administered 2019-06-05: 50 ug via INTRAVENOUS

## 2019-06-05 MED ORDER — HEPARIN (PORCINE) IN NACL 1000-0.9 UT/500ML-% IV SOLN
INTRAVENOUS | Status: DC | PRN
  Administered 2019-06-05 (×2): 500 mL

## 2019-06-05 MED ORDER — SODIUM CHLORIDE 0.9 % IV SOLN: INTRAVENOUS | Status: AC

## 2019-06-05 MED ORDER — NITROGLYCERIN 1 MG/10 ML FOR IR/CATH LAB
INTRA_ARTERIAL | Status: DC | PRN
  Administered 2019-06-05 (×2): 200 ug via INTRACORONARY

## 2019-06-05 MED ORDER — ENOXAPARIN SODIUM 40 MG/0.4ML ~~LOC~~ SOLN
40.0000 mg | SUBCUTANEOUS | Status: DC
  Administered 2019-06-06: 40 mg via SUBCUTANEOUS
  Filled 2019-06-05: qty 0.4

## 2019-06-05 MED ORDER — HEPARIN (PORCINE) IN NACL 1000-0.9 UT/500ML-% IV SOLN
INTRAVENOUS | Status: AC
  Filled 2019-06-05: qty 500

## 2019-06-05 MED ORDER — VERAPAMIL HCL 2.5 MG/ML IV SOLN
INTRAVENOUS | Status: AC
  Filled 2019-06-05: qty 2

## 2019-06-05 MED ORDER — IOHEXOL 350 MG/ML SOLN
INTRAVENOUS | Status: DC | PRN
  Administered 2019-06-05: 185 mL

## 2019-06-05 MED ORDER — LABETALOL HCL 5 MG/ML IV SOLN: 10.0000 mg | INTRAVENOUS | Status: AC | PRN

## 2019-06-05 MED ORDER — FENTANYL CITRATE (PF) 100 MCG/2ML IJ SOLN
INTRAMUSCULAR | Status: AC
  Filled 2019-06-05: qty 2

## 2019-06-05 MED ORDER — HEPARIN SODIUM (PORCINE) 1000 UNIT/ML IJ SOLN
INTRAMUSCULAR | Status: DC | PRN
  Administered 2019-06-05: 5000 [IU] via INTRAVENOUS
  Administered 2019-06-05: 4000 [IU] via INTRAVENOUS
  Administered 2019-06-05: 2000 [IU] via INTRAVENOUS
  Administered 2019-06-05: 5000 [IU] via INTRAVENOUS
  Administered 2019-06-05: 2000 [IU] via INTRAVENOUS

## 2019-06-05 MED ORDER — SODIUM CHLORIDE 0.9 % IV SOLN: 250.0000 mL | INTRAVENOUS | Status: DC | PRN

## 2019-06-05 MED ORDER — MIDAZOLAM HCL 2 MG/2ML IJ SOLN
INTRAMUSCULAR | Status: DC | PRN
  Administered 2019-06-05: 2 mg via INTRAVENOUS

## 2019-06-05 MED ORDER — VERAPAMIL HCL 2.5 MG/ML IV SOLN
INTRAVENOUS | Status: DC | PRN
  Administered 2019-06-05: 10 mL via INTRA_ARTERIAL

## 2019-06-05 MED ORDER — POTASSIUM CHLORIDE CRYS ER 20 MEQ PO TBCR
40.0000 meq | EXTENDED_RELEASE_TABLET | Freq: Once | ORAL | Status: AC
  Administered 2019-06-05: 18:00:00 40 meq via ORAL
  Filled 2019-06-05: qty 2

## 2019-06-05 MED ORDER — NITROGLYCERIN 1 MG/10 ML FOR IR/CATH LAB
INTRA_ARTERIAL | Status: AC
  Filled 2019-06-05: qty 10

## 2019-06-05 MED ORDER — MIDAZOLAM HCL 2 MG/2ML IJ SOLN
INTRAMUSCULAR | Status: AC
  Filled 2019-06-05: qty 2

## 2019-06-05 MED ORDER — LIDOCAINE HCL (PF) 1 % IJ SOLN
INTRAMUSCULAR | Status: AC
  Filled 2019-06-05: qty 30

## 2019-06-05 SURGICAL SUPPLY — 19 items
BALLN EUPHORA RX 2.25X12 (BALLOONS) ×2
BALLN SAPPHIRE ~~LOC~~ 2.75X18 (BALLOONS) ×1 IMPLANT
BALLOON EUPHORA RX 2.25X12 (BALLOONS) IMPLANT
CATH 5FR JL3.5 JR4 ANG PIG MP (CATHETERS) ×1 IMPLANT
CATH VISTA GUIDE 6FR XBLAD3.5 (CATHETERS) ×1 IMPLANT
DEVICE RAD COMP TR BAND LRG (VASCULAR PRODUCTS) ×1 IMPLANT
GLIDESHEATH SLEND SS 6F .021 (SHEATH) ×1 IMPLANT
GUIDEWIRE INQWIRE 1.5J.035X260 (WIRE) IMPLANT
INQWIRE 1.5J .035X260CM (WIRE) ×2
KIT ENCORE 26 ADVANTAGE (KITS) ×1 IMPLANT
KIT HEART LEFT (KITS) ×2 IMPLANT
PACK CARDIAC CATHETERIZATION (CUSTOM PROCEDURE TRAY) ×2 IMPLANT
STENT SYNERGY XD 2.50X38 (Permanent Stent) IMPLANT
SYNERGY XD 2.50X38 (Permanent Stent) ×2 IMPLANT
TRANSDUCER W/STOPCOCK (MISCELLANEOUS) ×2 IMPLANT
TUBING CIL FLEX 10 FLL-RA (TUBING) ×2 IMPLANT
WIRE ASAHI GRAND SLAM 180CM (WIRE) ×1 IMPLANT
WIRE HI TORQ BMW 190CM (WIRE) ×1 IMPLANT
WIRE RUNTHROUGH .014X180CM (WIRE) ×1 IMPLANT

## 2019-06-05 NOTE — H&P (View-Only) (Signed)
Progress Note  Patient Name: John Douglas Date of Encounter: 06/05/2019  Primary Cardiologist: Buford Dresser, MD   Subjective   No chest pain. Ready for heart cath  Inpatient Medications    Scheduled Meds: . aspirin  81 mg Oral Daily  . ezetimibe  10 mg Oral QHS  . lisinopril  2.5 mg Oral Daily  . metoprolol tartrate  50 mg Oral BID  . rosuvastatin  40 mg Oral QHS  . sodium chloride flush  3 mL Intravenous Q12H  . sodium chloride flush  3 mL Intravenous Q12H  . ticagrelor  90 mg Oral BID   Continuous Infusions: . sodium chloride    . sodium chloride    . sodium chloride 10 mL/hr at 06/05/19 0616   PRN Meds: sodium chloride, sodium chloride, acetaminophen, ALPRAZolam, nitroGLYCERIN, ondansetron (ZOFRAN) IV, sodium chloride flush, sodium chloride flush, zolpidem   Vital Signs    Vitals:   06/04/19 2100 06/05/19 0300 06/05/19 0813 06/05/19 0816  BP:  127/79 (!) 142/85   Pulse: 96 67  89  Resp:  19    Temp:  98.8 F (37.1 C)    TempSrc:  Oral    SpO2:  98%    Weight:  95.3 kg    Height:        Intake/Output Summary (Last 24 hours) at 06/05/2019 0932 Last data filed at 06/04/2019 2106 Gross per 24 hour  Intake 243 ml  Output --  Net 243 ml   Last 3 Weights 06/05/2019 06/04/2019 06/04/2019  Weight (lbs) 210 lb 1.6 oz 211 lb 1.6 oz 209 lb  Weight (kg) 95.3 kg 95.754 kg 94.802 kg      Telemetry    Converted from Afib to NSR in the 60s at 0154 - Personally Reviewed  ECG    No new tracings - Personally Reviewed  Physical Exam   GEN: No acute distress.   Neck: No JVD Cardiac: RRR, no murmurs, rubs, or gallops.  Respiratory: Clear to auscultation bilaterally. GI: Soft, nontender, non-distended  MS: No edema; No deformity. Neuro:  Nonfocal  Psych: Normal affect   Labs    High Sensitivity Troponin:   Recent Labs  Lab 06/04/19 1118 06/04/19 1330  TROPONINIHS 35* 362*      Chemistry Recent Labs  Lab 06/04/19 1118 06/05/19 0337  NA 140 144   K 3.8 3.7  CL 107 111  CO2 20* 22  GLUCOSE 227* 148*  BUN 24* 20  CREATININE 1.43* 1.11  CALCIUM 9.0 9.2  PROT 6.6  --   ALBUMIN 3.7  --   AST 19  --   ALT 18  --   ALKPHOS 48  --   BILITOT 1.0  --   GFRNONAA 51* >60  GFRAA 59* >60  ANIONGAP 13 11     Hematology Recent Labs  Lab 06/04/19 1118 06/05/19 0337  WBC 12.0* 12.4*  RBC 4.31 4.08*  HGB 12.1* 11.4*  HCT 36.6* 34.7*  MCV 84.9 85.0  MCH 28.1 27.9  MCHC 33.1 32.9  RDW 16.5* 16.7*  PLT 346 291    BNPNo results for input(s): BNP, PROBNP in the last 168 hours.   DDimer No results for input(s): DDIMER in the last 168 hours.   Radiology    DG Chest Port 1 View  Result Date: 06/04/2019 CLINICAL DATA:  Atrial fibrillation. EXAM: PORTABLE CHEST 1 VIEW COMPARISON:  Single-view of the chest 04/28/2019. FINDINGS: The lungs are clear. Heart size is normal. No pneumothorax or pleural  fluid. No acute or focal bony abnormality. IMPRESSION: Negative chest. Electronically Signed   By: Inge Rise M.D.   On: 06/04/2019 12:23   ECHOCARDIOGRAM COMPLETE  Result Date: 06/04/2019   ECHOCARDIOGRAM REPORT   Patient Name:   John Douglas Date of Exam: 06/04/2019 Medical Rec #:  [id number]     Height:       70.0 in Accession #:    [id number]    Weight:       209.0 lb Date of Birth:  1955-05-29    BSA:          2.13 m Patient Age:    64 years      BP:           114/84 mmHg Patient Gender: M             HR:           81 bpm. Exam Location:  Inpatient Procedure: 2D Echo, Cardiac Doppler and Color Doppler STAT ECHO Indications:    Ventricular Tachycardia I47.2  History:        Patient has no prior history of Echocardiogram examinations.                 Risk Factors:Non-Smoker.  Sonographer:    Vickie Epley RDCS Referring Phys: [id number] [place]  1. Left ventricular ejection fraction, by visual estimation, is 45 to 50%. The left ventricle has mildly decreased function. There is no left ventricular hypertrophy.  2. The left  ventricle demonstrates regional wall motion abnormalities.  3. Inferior basal hypokinesis.  4. Global right ventricle has normal systolic function.The right ventricular size is normal. No increase in right ventricular wall thickness.  5. Left atrial size was normal.  6. Right atrial size was normal.  7. Mild mitral annular calcification.  8. The mitral valve is normal in structure. Trivial mitral valve regurgitation. No evidence of mitral stenosis.  9. The tricuspid valve is normal in structure. 10. The tricuspid valve is normal in structure. Tricuspid valve regurgitation is not demonstrated. 11. The aortic valve is normal in structure. Aortic valve regurgitation is not visualized. Mild to moderate aortic valve sclerosis/calcification without any evidence of aortic stenosis. 12. The pulmonic valve was normal in structure. Pulmonic valve regurgitation is not visualized. 13. The inferior vena cava is normal in size with greater than 50% respiratory variability, suggesting right atrial pressure of 3 mmHg. FINDINGS  Left Ventricle: Left ventricular ejection fraction, by visual estimation, is 45 to 50%. The left ventricle has mildly decreased function. The left ventricle demonstrates regional wall motion abnormalities. The left ventricular internal cavity size was the left ventricle is normal in size. There is no left ventricular hypertrophy. Left ventricular diastolic parameters were normal. Normal left atrial pressure. Inferior basal hypokinesis. Right Ventricle: The right ventricular size is normal. No increase in right ventricular wall thickness. Global RV systolic function is has normal systolic function. Left Atrium: Left atrial size was normal in size. Right Atrium: Right atrial size was normal in size Pericardium: There is no evidence of pericardial effusion. Mitral Valve: The mitral valve is normal in structure. There is mild thickening of the mitral valve leaflet(s). There is mild calcification of the mitral  valve leaflet(s). Mild mitral annular calcification. Trivial mitral valve regurgitation. No evidence  of mitral valve stenosis by observation. Tricuspid Valve: The tricuspid valve is normal in structure. Tricuspid valve regurgitation is not demonstrated. Aortic Valve: The aortic valve is normal in structure. Aortic valve regurgitation is  not visualized. Mild to moderate aortic valve sclerosis/calcification is present, without any evidence of aortic stenosis. Pulmonic Valve: The pulmonic valve was normal in structure. Pulmonic valve regurgitation is not visualized. Pulmonic regurgitation is not visualized. Aorta: The aortic root, ascending aorta and aortic arch are all structurally normal, with no evidence of dilitation or obstruction. Venous: The inferior vena cava is normal in size with greater than 50% respiratory variability, suggesting right atrial pressure of 3 mmHg. IAS/Shunts: No atrial level shunt detected by color flow Doppler. There is no evidence of a patent foramen ovale. No ventricular septal defect is seen or detected. There is no evidence of an atrial septal defect.  LEFT VENTRICLE PLAX 2D LVIDd:         5.10 cm LVIDs:         4.10 cm LV PW:         0.90 cm LV IVS:        0.90 cm LV SV:         50 ml LV SV Index:   22.67  LV Volumes (MOD) LV area d, A2C:    35.40 cm LV area d, A4C:    40.20 cm LV area s, A2C:    24.70 cm LV area s, A4C:    26.10 cm LV major d, A2C:   7.91 cm LV major d, A4C:   8.26 cm LV major s, A2C:   6.99 cm LV major s, A4C:   6.91 cm LV vol d, MOD A2C: 132.0 ml LV vol d, MOD A4C: 161.0 ml LV vol s, MOD A2C: 75.6 ml LV vol s, MOD A4C: 82.7 ml LV SV MOD A2C:     56.4 ml LV SV MOD A4C:     161.0 ml LV SV MOD BP:      69.5 ml RIGHT VENTRICLE TAPSE (M-mode): 1.4 cm LEFT ATRIUM             Index       RIGHT ATRIUM           Index LA diam:        3.70 cm 1.74 cm/m  RA Area:     12.80 cm LA Vol (A2C):   33.0 ml 15.52 ml/m RA Volume:   30.80 ml  14.48 ml/m LA Vol (A4C):   42.9 ml  20.17 ml/m LA Biplane Vol: 37.2 ml 17.49 ml/m  AORTIC VALVE LVOT Vmax:   75.10 cm/s LVOT Vmean:  51.900 cm/s LVOT VTI:    0.129 m  AORTA Ao Root diam: 3.40 cm  SHUNTS Systemic VTI: 0.13 m  Jenkins Rouge MD Electronically signed by Jenkins Rouge MD Signature Date/Time: 06/04/2019/3:55:57 PM    Final     Cardiac Studies   Echo 06/04/19: 1. Left ventricular ejection fraction, by visual estimation, is 45 to  50%. The left ventricle has mildly decreased function. There is no left  ventricular hypertrophy.  2. The left ventricle demonstrates regional wall motion abnormalities.  3. Inferior basal hypokinesis.  4. Global right ventricle has normal systolic function.The right  ventricular size is normal. No increase in right ventricular wall  thickness.  5. Left atrial size was normal.  6. Right atrial size was normal.  7. Mild mitral annular calcification.  8. The mitral valve is normal in structure. Trivial mitral valve  regurgitation. No evidence of mitral stenosis.  9. The tricuspid valve is normal in structure.  10. The tricuspid valve is normal in structure. Tricuspid valve  regurgitation is not  demonstrated.  11. The aortic valve is normal in structure. Aortic valve regurgitation is  not visualized. Mild to moderate aortic valve sclerosis/calcification  without any evidence of aortic stenosis.  12. The pulmonic valve was normal in structure. Pulmonic valve  regurgitation is not visualized.  13. The inferior vena cava is normal in size with greater than 50%  respiratory variability, suggesting right atrial pressure of 3 mmHg.   Patient Profile     64 y.o. male with a hx of hypertension, hyperlipidemia, A. fib, prediabetes, obesity, cardiomyopathy (EF 45%), and recent STEMI with CAD s/p PCI DES x 3 LAD 03/2019 who is being seen today for the evaluation of VT and irregular heartbeat.   Assessment & Plan    1. Ventricular tachycardia - IV amiodarone with EMS - no further Vtach  overnight - remains in sinus rhythm   2. NSTEMI 3. Hx of STEMI s/p stents x 3 to RCA - hs troponin 35 --> 362 - narrow complex EKG without ST elevation, but pt with known LAD disease not intervened on during his prior heart cath at [facility] 03/2019 - chest discomfort and diaphoresis in the setting of VT - will plan for heart cath today - on ASA and brilinta   4. Atrial fibrillation with RVR - converted to NSR at 0154 - HR in the 60s - lopressor 50 mg BID - eliquis on hold for heart cath today - This patients CHA2DS2-VASc Score and unadjusted Ischemic Stroke Rate (% per year) is equal to 7.2 % stroke rate/year from a score of 5 (age, HTN, DM, CAD, CHF)   5. Hypertension - 2.5 mg lisinopril, BB - pressures have been generally controlled    For questions or updates, please contact [place] Please consult [email or web address] for contact info under        Signed, Ledora Bottcher, PA  06/05/2019, 9:32 AM

## 2019-06-05 NOTE — Interval H&P Note (Signed)
History and Physical Interval Note:  06/05/2019 12:50 PM  John Douglas  has presented today for surgery, with the diagnosis of chest pain and ventricular tachycardia.  The various methods of treatment have been discussed with the patient and family. After consideration of risks, benefits and other options for treatment, the patient has consented to  Procedure(s): LEFT HEART CATH AND CORONARY ANGIOGRAPHY (N/A) as a surgical intervention.  The patient's history has been reviewed, patient examined, no change in status, stable for surgery.  I have reviewed the patient's chart and labs.  Questions were answered to the patient's satisfaction.    Cath Lab Visit (complete for each Cath Lab visit)  Clinical Evaluation Leading to the Procedure:   ACS: Yes.    Non-ACS:  N/A  Verneal Wiers

## 2019-06-05 NOTE — Brief Op Note (Signed)
BRIEF CARDIAC CATHETERIZATION NOTE  06/05/2019  2:41 PM  PATIENT:  John Douglas  64 y.o. male  PRE-OPERATIVE DIAGNOSIS:  NSTEMI, VT  POST-OPERATIVE DIAGNOSIS:  Same  PROCEDURE:  Procedure(s): LEFT HEART CATH AND CORONARY ANGIOGRAPHY (N/A)  SURGEON:  Surgeon(s) and Role:    Nelva Bush, MD - Primary  FINDINGS: 1. Severe single-vessel CAD with sequential 40% proximal and tandem 80-90% mid LAD stenoses, as well as 70% ostial D2 lesion. 2. Moderate, non-obstructive disease involving LCx. 3. Widely patent overlapping stents from the proximal through distal RCA. 4. Normal LVEDP. 5. Successful PCI to mid LAD using Synergy 2.5 x 38 mm drug-eluting stent (post-dilated to 2.9 mm) with 0% residual stenosis and TIMI-3 flow.  RECOMMENDATIONS: 1. Continue aspirin and ticagrelor; will need to weigh risks and benefits of switching back to apixaban and ticagrelor given report of hematochezia. 2. Aggressive secondary prevention.  Nelva Bush, MD [facility]

## 2019-06-05 NOTE — Progress Notes (Signed)
Pt has indicated he becomes highly anxious . Pt is requesting for his anxiety problem addressed prior to discharge. Thanks

## 2019-06-05 NOTE — Plan of Care (Signed)
  Problem: Education: Goal: Knowledge of General Education information will improve Description Including pain rating scale, medication(s)/side effects and non-pharmacologic comfort measures Outcome: Progressing   

## 2019-06-05 NOTE — Progress Notes (Signed)
Progress Note  Patient Name: John Douglas Date of Encounter: 06/05/2019  Primary Cardiologist: Buford Dresser, MD   Subjective   No chest pain. Ready for heart cath  Inpatient Medications    Scheduled Meds: . aspirin  81 mg Oral Daily  . ezetimibe  10 mg Oral QHS  . lisinopril  2.5 mg Oral Daily  . metoprolol tartrate  50 mg Oral BID  . rosuvastatin  40 mg Oral QHS  . sodium chloride flush  3 mL Intravenous Q12H  . sodium chloride flush  3 mL Intravenous Q12H  . ticagrelor  90 mg Oral BID   Continuous Infusions: . sodium chloride    . sodium chloride    . sodium chloride 10 mL/hr at 06/05/19 0616   PRN Meds: sodium chloride, sodium chloride, acetaminophen, ALPRAZolam, nitroGLYCERIN, ondansetron (ZOFRAN) IV, sodium chloride flush, sodium chloride flush, zolpidem   Vital Signs    Vitals:   06/04/19 2100 06/05/19 0300 06/05/19 0813 06/05/19 0816  BP:  127/79 (!) 142/85   Pulse: 96 67  89  Resp:  19    Temp:  98.8 F (37.1 C)    TempSrc:  Oral    SpO2:  98%    Weight:  95.3 kg    Height:        Intake/Output Summary (Last 24 hours) at 06/05/2019 0932 Last data filed at 06/04/2019 2106 Gross per 24 hour  Intake 243 ml  Output --  Net 243 ml   Last 3 Weights 06/05/2019 06/04/2019 06/04/2019  Weight (lbs) 210 lb 1.6 oz 211 lb 1.6 oz 209 lb  Weight (kg) 95.3 kg 95.754 kg 94.802 kg      Telemetry    Converted from Afib to NSR in the 60s at 0154 - Personally Reviewed  ECG    No new tracings - Personally Reviewed  Physical Exam   GEN: No acute distress.   Neck: No JVD Cardiac: RRR, no murmurs, rubs, or gallops.  Respiratory: Clear to auscultation bilaterally. GI: Soft, nontender, non-distended  MS: No edema; No deformity. Neuro:  Nonfocal  Psych: Normal affect   Labs    High Sensitivity Troponin:   Recent Labs  Lab 06/04/19 1118 06/04/19 1330  TROPONINIHS 35* 362*      Chemistry Recent Labs  Lab 06/04/19 1118 06/05/19 0337  NA 140 144   K 3.8 3.7  CL 107 111  CO2 20* 22  GLUCOSE 227* 148*  BUN 24* 20  CREATININE 1.43* 1.11  CALCIUM 9.0 9.2  PROT 6.6  --   ALBUMIN 3.7  --   AST 19  --   ALT 18  --   ALKPHOS 48  --   BILITOT 1.0  --   GFRNONAA 51* >60  GFRAA 59* >60  ANIONGAP 13 11     Hematology Recent Labs  Lab 06/04/19 1118 06/05/19 0337  WBC 12.0* 12.4*  RBC 4.31 4.08*  HGB 12.1* 11.4*  HCT 36.6* 34.7*  MCV 84.9 85.0  MCH 28.1 27.9  MCHC 33.1 32.9  RDW 16.5* 16.7*  PLT 346 291    BNPNo results for input(s): BNP, PROBNP in the last 168 hours.   DDimer No results for input(s): DDIMER in the last 168 hours.   Radiology    DG Chest Port 1 View  Result Date: 06/04/2019 CLINICAL DATA:  Atrial fibrillation. EXAM: PORTABLE CHEST 1 VIEW COMPARISON:  Single-view of the chest 04/28/2019. FINDINGS: The lungs are clear. Heart size is normal. No pneumothorax or pleural  fluid. No acute or focal bony abnormality. IMPRESSION: Negative chest. Electronically Signed   By: Inge Rise M.D.   On: 06/04/2019 12:23   ECHOCARDIOGRAM COMPLETE  Result Date: 06/04/2019   ECHOCARDIOGRAM REPORT   Patient Name:   [name] Date of Exam: 06/04/2019 Medical Rec #:  [id number]     Height:       70.0 in Accession #:    [id number]    Weight:       209.0 lb Date of Birth:  1955/06/24    BSA:          2.13 m Patient Age:    64 years      BP:           114/84 mmHg Patient Gender: M             HR:           81 bpm. Exam Location:  Inpatient Procedure: 2D Echo, Cardiac Doppler and Color Doppler STAT ECHO Indications:    Ventricular Tachycardia I47.2  History:        Patient has no prior history of Echocardiogram examinations.                 Risk Factors:Non-Smoker.  Sonographer:    Vickie Epley RDCS Referring Phys: [id number] [place]  1. Left ventricular ejection fraction, by visual estimation, is 45 to 50%. The left ventricle has mildly decreased function. There is no left ventricular hypertrophy.  2. The left  ventricle demonstrates regional wall motion abnormalities.  3. Inferior basal hypokinesis.  4. Global right ventricle has normal systolic function.The right ventricular size is normal. No increase in right ventricular wall thickness.  5. Left atrial size was normal.  6. Right atrial size was normal.  7. Mild mitral annular calcification.  8. The mitral valve is normal in structure. Trivial mitral valve regurgitation. No evidence of mitral stenosis.  9. The tricuspid valve is normal in structure. 10. The tricuspid valve is normal in structure. Tricuspid valve regurgitation is not demonstrated. 11. The aortic valve is normal in structure. Aortic valve regurgitation is not visualized. Mild to moderate aortic valve sclerosis/calcification without any evidence of aortic stenosis. 12. The pulmonic valve was normal in structure. Pulmonic valve regurgitation is not visualized. 13. The inferior vena cava is normal in size with greater than 50% respiratory variability, suggesting right atrial pressure of 3 mmHg. FINDINGS  Left Ventricle: Left ventricular ejection fraction, by visual estimation, is 45 to 50%. The left ventricle has mildly decreased function. The left ventricle demonstrates regional wall motion abnormalities. The left ventricular internal cavity size was the left ventricle is normal in size. There is no left ventricular hypertrophy. Left ventricular diastolic parameters were normal. Normal left atrial pressure. Inferior basal hypokinesis. Right Ventricle: The right ventricular size is normal. No increase in right ventricular wall thickness. Global RV systolic function is has normal systolic function. Left Atrium: Left atrial size was normal in size. Right Atrium: Right atrial size was normal in size Pericardium: There is no evidence of pericardial effusion. Mitral Valve: The mitral valve is normal in structure. There is mild thickening of the mitral valve leaflet(s). There is mild calcification of the mitral  valve leaflet(s). Mild mitral annular calcification. Trivial mitral valve regurgitation. No evidence  of mitral valve stenosis by observation. Tricuspid Valve: The tricuspid valve is normal in structure. Tricuspid valve regurgitation is not demonstrated. Aortic Valve: The aortic valve is normal in structure. Aortic valve regurgitation is  not visualized. Mild to moderate aortic valve sclerosis/calcification is present, without any evidence of aortic stenosis. Pulmonic Valve: The pulmonic valve was normal in structure. Pulmonic valve regurgitation is not visualized. Pulmonic regurgitation is not visualized. Aorta: The aortic root, ascending aorta and aortic arch are all structurally normal, with no evidence of dilitation or obstruction. Venous: The inferior vena cava is normal in size with greater than 50% respiratory variability, suggesting right atrial pressure of 3 mmHg. IAS/Shunts: No atrial level shunt detected by color flow Doppler. There is no evidence of a patent foramen ovale. No ventricular septal defect is seen or detected. There is no evidence of an atrial septal defect.  LEFT VENTRICLE PLAX 2D LVIDd:         5.10 cm LVIDs:         4.10 cm LV PW:         0.90 cm LV IVS:        0.90 cm LV SV:         50 ml LV SV Index:   22.67  LV Volumes (MOD) LV area d, A2C:    35.40 cm LV area d, A4C:    40.20 cm LV area s, A2C:    24.70 cm LV area s, A4C:    26.10 cm LV major d, A2C:   7.91 cm LV major d, A4C:   8.26 cm LV major s, A2C:   6.99 cm LV major s, A4C:   6.91 cm LV vol d, MOD A2C: 132.0 ml LV vol d, MOD A4C: 161.0 ml LV vol s, MOD A2C: 75.6 ml LV vol s, MOD A4C: 82.7 ml LV SV MOD A2C:     56.4 ml LV SV MOD A4C:     161.0 ml LV SV MOD BP:      69.5 ml RIGHT VENTRICLE TAPSE (M-mode): 1.4 cm LEFT ATRIUM             Index       RIGHT ATRIUM           Index LA diam:        3.70 cm 1.74 cm/m  RA Area:     12.80 cm LA Vol (A2C):   33.0 ml 15.52 ml/m RA Volume:   30.80 ml  14.48 ml/m LA Vol (A4C):   42.9 ml  20.17 ml/m LA Biplane Vol: 37.2 ml 17.49 ml/m  AORTIC VALVE LVOT Vmax:   75.10 cm/s LVOT Vmean:  51.900 cm/s LVOT VTI:    0.129 m  AORTA Ao Root diam: 3.40 cm  SHUNTS Systemic VTI: 0.13 m  Jenkins Rouge MD Electronically signed by Jenkins Rouge MD Signature Date/Time: 06/04/2019/3:55:57 PM    Final     Cardiac Studies   Echo 06/04/19: 1. Left ventricular ejection fraction, by visual estimation, is 45 to  50%. The left ventricle has mildly decreased function. There is no left  ventricular hypertrophy.  2. The left ventricle demonstrates regional wall motion abnormalities.  3. Inferior basal hypokinesis.  4. Global right ventricle has normal systolic function.The right  ventricular size is normal. No increase in right ventricular wall  thickness.  5. Left atrial size was normal.  6. Right atrial size was normal.  7. Mild mitral annular calcification.  8. The mitral valve is normal in structure. Trivial mitral valve  regurgitation. No evidence of mitral stenosis.  9. The tricuspid valve is normal in structure.  10. The tricuspid valve is normal in structure. Tricuspid valve  regurgitation is not  demonstrated.  11. The aortic valve is normal in structure. Aortic valve regurgitation is  not visualized. Mild to moderate aortic valve sclerosis/calcification  without any evidence of aortic stenosis.  12. The pulmonic valve was normal in structure. Pulmonic valve  regurgitation is not visualized.  13. The inferior vena cava is normal in size with greater than 50%  respiratory variability, suggesting right atrial pressure of 3 mmHg.   Patient Profile     64 y.o. male with a hx of hypertension, hyperlipidemia, A. fib, prediabetes, obesity, cardiomyopathy (EF 45%), and recent STEMI with CAD s/p PCI DES x 3 LAD 03/2019 who is being seen today for the evaluation of VT and irregular heartbeat.   Assessment & Plan    1. Ventricular tachycardia - IV amiodarone with EMS - no further Vtach  overnight - remains in sinus rhythm   2. NSTEMI 3. Hx of STEMI s/p stents x 3 to RCA - hs troponin 35 --> 362 - narrow complex EKG without ST elevation, but pt with known LAD disease not intervened on during his prior heart cath at [facility] 03/2019 - chest discomfort and diaphoresis in the setting of VT - will plan for heart cath today - on ASA and brilinta   4. Atrial fibrillation with RVR - converted to NSR at 0154 - HR in the 60s - lopressor 50 mg BID - eliquis on hold for heart cath today - This patients CHA2DS2-VASc Score and unadjusted Ischemic Stroke Rate (% per year) is equal to 7.2 % stroke rate/year from a score of 5 (age, HTN, DM, CAD, CHF)   5. Hypertension - 2.5 mg lisinopril, BB - pressures have been generally controlled    For questions or updates, please contact [place] Please consult [email or web address] for contact info under        Signed, Ledora Bottcher, PA  06/05/2019, 9:32 AM

## 2019-06-06 DIAGNOSIS — I10 Essential (primary) hypertension: Secondary | ICD-10-CM

## 2019-06-06 DIAGNOSIS — I480 Paroxysmal atrial fibrillation: Secondary | ICD-10-CM

## 2019-06-06 DIAGNOSIS — I152 Hypertension secondary to endocrine disorders: Secondary | ICD-10-CM

## 2019-06-06 DIAGNOSIS — E785 Hyperlipidemia, unspecified: Secondary | ICD-10-CM

## 2019-06-06 DIAGNOSIS — I4891 Unspecified atrial fibrillation: Secondary | ICD-10-CM

## 2019-06-06 LAB — BASIC METABOLIC PANEL
Anion gap: 7 (ref 5–15)
BUN: 18 mg/dL (ref 8–23)
CO2: 25 mmol/L (ref 22–32)
Calcium: 9 mg/dL (ref 8.9–10.3)
Chloride: 111 mmol/L (ref 98–111)
Creatinine, Ser: 1.09 mg/dL (ref 0.61–1.24)
GFR calc Af Amer: >60 mL/min (ref >60)
GFR calc non Af Amer: >60 mL/min (ref >60)
Glucose, Bld: 156 mg/dL — ABNORMAL HIGH (ref 70–99)
Potassium: 4.5 mmol/L (ref 3.5–5.1)
Sodium: 143 mmol/L (ref 135–145)

## 2019-06-06 LAB — CBC
HCT: 32.2 % — ABNORMAL LOW (ref 39.0–52.0)
Hemoglobin: 10.5 g/dL — ABNORMAL LOW (ref 13.0–17.0)
MCH: 27.9 pg (ref 26.0–34.0)
MCHC: 32.6 g/dL (ref 30.0–36.0)
MCV: 85.6 fL (ref 80.0–100.0)
Platelets: 244 10*3/uL (ref 150–400)
RBC: 3.76 MIL/uL — ABNORMAL LOW (ref 4.22–5.81)
RDW: 17 % — ABNORMAL HIGH (ref 11.5–15.5)
WBC: 8.9 10*3/uL (ref 4.0–10.5)
nRBC: 0 % (ref 0.0–0.2)

## 2019-06-06 LAB — MAGNESIUM: Magnesium: 2.1 mg/dL (ref 1.7–2.4)

## 2019-06-06 MED ORDER — ASPIRIN 81 MG PO CHEW: 81.0000 mg | CHEWABLE_TABLET | Freq: Every day | ORAL | Status: DC

## 2019-06-06 NOTE — Progress Notes (Signed)
CARDIAC REHAB PHASE I   PRE:  Rate/Rhythm: 67 w/ PVCs  BP:  Supine: 138/82 Sitting:   Standing:    SaO2: 98% RA  MODE:  Ambulation: 470 ft   POST:  Rate/Rhythm: 94 w/ PVCs  BP:  Supine: 165/83 Sitting:   Standing:    SaO2: 98% RA  [id number] Reviewed MI/PCI education with patient including restrictions, Brilinta use, CP, NTG use, and calling 911, risk factor modification and activity progression. Pt states that he was active and eating a heart healthy diet already. Pt states that his blood sugar is under control and that he doesn't take Metformin anymore. MI book, heart healthy/diabetic diet, and exercise handout given. Pt verbalizes understanding. Discussed Phase 2 cardiac rehab, and patient states that he was referred to the program in [place] after his MI in December but wasn't contacted. Will send another referral to the program at [facility].  Sol Passer, MS, ACSM CEP

## 2019-06-06 NOTE — Progress Notes (Signed)
Progress Note  Patient Name: John Douglas Date of Encounter: 06/06/2019  Primary Cardiologist: Buford Dresser, MD   Subjective   Denies chest pain, palpitations, shortness of breath.  He does have a history of intermittent hematochezia.  He told me how he lost 40 pounds and be type 2 diabetes.  He would like to follow-up with Dr. Saunders Revel in [place].  Inpatient Medications    Scheduled Meds:  aspirin  81 mg Oral Daily   enoxaparin (LOVENOX) injection  40 mg Subcutaneous Q24H   ezetimibe  10 mg Oral QHS   lisinopril  2.5 mg Oral Daily   metoprolol tartrate  50 mg Oral BID   rosuvastatin  40 mg Oral QHS   sodium chloride flush  3 mL Intravenous Q12H   sodium chloride flush  3 mL Intravenous Q12H   sodium chloride flush  3 mL Intravenous Q12H   ticagrelor  90 mg Oral BID   Continuous Infusions:  sodium chloride     sodium chloride     PRN Meds: sodium chloride, sodium chloride, acetaminophen, ALPRAZolam, nitroGLYCERIN, ondansetron (ZOFRAN) IV, sodium chloride flush, sodium chloride flush, zolpidem   Vital Signs    Vitals:   06/05/19 1432 06/05/19 1453 06/05/19 2111 06/06/19 0436  BP: 135/88 131/85 128/75 127/76  Pulse: (!) 0  71 64  Resp: (!) 0 12 19 14   Temp:   99.1 F (37.3 C) 99.3 F (37.4 C)  TempSrc:   Oral Oral  SpO2: (!) 0%  96% 97%  Weight:    95.3 kg  Height:        Intake/Output Summary (Last 24 hours) at 06/06/2019 0917 Last data filed at 06/06/2019 0405 Gross per 24 hour  Intake 666 ml  Output --  Net 666 ml   Filed Weights   06/04/19 1700 06/05/19 0300 06/06/19 0436  Weight: 95.8 kg 95.3 kg 95.3 kg    Telemetry    Sinus rhythm- Personally Reviewed  ECG    Sinus rhythm with inferolateral T wave inversions- Personally Reviewed  Physical Exam   GEN: No acute distress.   Neck: No JVD Cardiac: RRR, no murmurs, rubs, or gallops.  Respiratory: Clear to auscultation bilaterally. GI: Soft, nontender, non-distended  MS: No  edema; No deformity. Neuro:  Nonfocal  Psych: Normal affect   Labs    Chemistry Recent Labs  Lab 06/04/19 1118 06/05/19 0337 06/06/19 0351  NA 140 144 143  K 3.8 3.7 4.5  CL 107 111 111  CO2 20* 22 25  GLUCOSE 227* 148* 156*  BUN 24* 20 18  CREATININE 1.43* 1.11 1.09  CALCIUM 9.0 9.2 9.0  PROT 6.6  --   --   ALBUMIN 3.7  --   --   AST 19  --   --   ALT 18  --   --   ALKPHOS 48  --   --   BILITOT 1.0  --   --   GFRNONAA 51* >60 >60  GFRAA 59* >60 >60  ANIONGAP 13 11 7      Hematology Recent Labs  Lab 06/04/19 1118 06/05/19 0337 06/06/19 0351  WBC 12.0* 12.4* 8.9  RBC 4.31 4.08* 3.76*  HGB 12.1* 11.4* 10.5*  HCT 36.6* 34.7* 32.2*  MCV 84.9 85.0 85.6  MCH 28.1 27.9 27.9  MCHC 33.1 32.9 32.6  RDW 16.5* 16.7* 17.0*  PLT 346 291 244    Cardiac EnzymesNo results for input(s): TROPONINI in the last 168 hours. No results for input(s): TROPIPOC in  the last 168 hours.   BNPNo results for input(s): BNP, PROBNP in the last 168 hours.   DDimer No results for input(s): DDIMER in the last 168 hours.   Radiology    CARDIAC CATHETERIZATION  Result Date: 06/05/2019 Conclusions: 1. Significant single-vessel coronary artery disease with diffuse mid LAD disease of up to 80-90%, extending into the ostium of D1. 2. Mild to moderate, non-obstructive coronary artery disease involving the proximal LAD, LCx and RCA. 3. Widely patent overlapping stents in the RCA extending from the ostium through the distal vessel. 4. Successful PCI to the mid LAD using Synergy 2.5 x 38 mm drug-eluting stent (post-dilated to 2.9 mm) with 0% residual stenosis and TIMI-3 flow.  Jailed D2 branch exhibits 90% ostial stenosis but TIMI-3 flow.  This branch was not intervened upon. Recommendations: 1. Dual antiplatelet therapy with aspirin and ticagrelor for at least 3 months, at which point we could consider stopping aspirin given report of hematochezia. 2. I favor holding apixaban going further, as I worry that  the risk of GI bleeding outweight the benefit at this time. 3. Aggressive secondary prevention. Nelva Bush, MD [facility]   DG Chest Port 1 View  Result Date: 06/04/2019 CLINICAL DATA:  Atrial fibrillation. EXAM: PORTABLE CHEST 1 VIEW COMPARISON:  Single-view of the chest 04/28/2019. FINDINGS: The lungs are clear. Heart size is normal. No pneumothorax or pleural fluid. No acute or focal bony abnormality. IMPRESSION: Negative chest. Electronically Signed   By: Inge Rise M.D.   On: 06/04/2019 12:23   ECHOCARDIOGRAM COMPLETE  Result Date: 06/04/2019   ECHOCARDIOGRAM REPORT   Patient Name:   John Douglas Date of Exam: 06/04/2019 Medical Rec #:  [id number]     Height:       70.0 in Accession #:    [id number]    Weight:       209.0 lb Date of Birth:  February 27, 1956    BSA:          2.13 m Patient Age:    64 years      BP:           114/84 mmHg Patient Gender: M             HR:           81 bpm. Exam Location:  Inpatient Procedure: 2D Echo, Cardiac Doppler and Color Doppler STAT ECHO Indications:    Ventricular Tachycardia I47.2  History:        Patient has no prior history of Echocardiogram examinations.                 Risk Factors:Non-Smoker.  Sonographer:    Vickie Epley RDCS Referring Phys: [id number] [place]  1. Left ventricular ejection fraction, by visual estimation, is 45 to 50%. The left ventricle has mildly decreased function. There is no left ventricular hypertrophy.  2. The left ventricle demonstrates regional wall motion abnormalities.  3. Inferior basal hypokinesis.  4. Global right ventricle has normal systolic function.The right ventricular size is normal. No increase in right ventricular wall thickness.  5. Left atrial size was normal.  6. Right atrial size was normal.  7. Mild mitral annular calcification.  8. The mitral valve is normal in structure. Trivial mitral valve regurgitation. No evidence of mitral stenosis.  9. The tricuspid valve is normal in structure. 10. The  tricuspid valve is normal in structure. Tricuspid valve regurgitation is not demonstrated. 11. The aortic valve is normal in structure. Aortic valve  regurgitation is not visualized. Mild to moderate aortic valve sclerosis/calcification without any evidence of aortic stenosis. 12. The pulmonic valve was normal in structure. Pulmonic valve regurgitation is not visualized. 13. The inferior vena cava is normal in size with greater than 50% respiratory variability, suggesting right atrial pressure of 3 mmHg. FINDINGS  Left Ventricle: Left ventricular ejection fraction, by visual estimation, is 45 to 50%. The left ventricle has mildly decreased function. The left ventricle demonstrates regional wall motion abnormalities. The left ventricular internal cavity size was the left ventricle is normal in size. There is no left ventricular hypertrophy. Left ventricular diastolic parameters were normal. Normal left atrial pressure. Inferior basal hypokinesis. Right Ventricle: The right ventricular size is normal. No increase in right ventricular wall thickness. Global RV systolic function is has normal systolic function. Left Atrium: Left atrial size was normal in size. Right Atrium: Right atrial size was normal in size Pericardium: There is no evidence of pericardial effusion. Mitral Valve: The mitral valve is normal in structure. There is mild thickening of the mitral valve leaflet(s). There is mild calcification of the mitral valve leaflet(s). Mild mitral annular calcification. Trivial mitral valve regurgitation. No evidence  of mitral valve stenosis by observation. Tricuspid Valve: The tricuspid valve is normal in structure. Tricuspid valve regurgitation is not demonstrated. Aortic Valve: The aortic valve is normal in structure. Aortic valve regurgitation is not visualized. Mild to moderate aortic valve sclerosis/calcification is present, without any evidence of aortic stenosis. Pulmonic Valve: The pulmonic valve was normal in  structure. Pulmonic valve regurgitation is not visualized. Pulmonic regurgitation is not visualized. Aorta: The aortic root, ascending aorta and aortic arch are all structurally normal, with no evidence of dilitation or obstruction. Venous: The inferior vena cava is normal in size with greater than 50% respiratory variability, suggesting right atrial pressure of 3 mmHg. IAS/Shunts: No atrial level shunt detected by color flow Doppler. There is no evidence of a patent foramen ovale. No ventricular septal defect is seen or detected. There is no evidence of an atrial septal defect.  LEFT VENTRICLE PLAX 2D LVIDd:         5.10 cm LVIDs:         4.10 cm LV PW:         0.90 cm LV IVS:        0.90 cm LV SV:         50 ml LV SV Index:   22.67  LV Volumes (MOD) LV area d, A2C:    35.40 cm LV area d, A4C:    40.20 cm LV area s, A2C:    24.70 cm LV area s, A4C:    26.10 cm LV major d, A2C:   7.91 cm LV major d, A4C:   8.26 cm LV major s, A2C:   6.99 cm LV major s, A4C:   6.91 cm LV vol d, MOD A2C: 132.0 ml LV vol d, MOD A4C: 161.0 ml LV vol s, MOD A2C: 75.6 ml LV vol s, MOD A4C: 82.7 ml LV SV MOD A2C:     56.4 ml LV SV MOD A4C:     161.0 ml LV SV MOD BP:      69.5 ml RIGHT VENTRICLE TAPSE (M-mode): 1.4 cm LEFT ATRIUM             Index       RIGHT ATRIUM           Index LA diam:  3.70 cm 1.74 cm/m  RA Area:     12.80 cm LA Vol (A2C):   33.0 ml 15.52 ml/m RA Volume:   30.80 ml  14.48 ml/m LA Vol (A4C):   42.9 ml 20.17 ml/m LA Biplane Vol: 37.2 ml 17.49 ml/m  AORTIC VALVE LVOT Vmax:   75.10 cm/s LVOT Vmean:  51.900 cm/s LVOT VTI:    0.129 m  AORTA Ao Root diam: 3.40 cm  SHUNTS Systemic VTI: 0.13 m  Jenkins Rouge MD Electronically signed by Jenkins Rouge MD Signature Date/Time: 06/04/2019/3:55:57 PM    Final     Cardiac Studies   Echo 06/04/19: 1. Left ventricular ejection fraction, by visual estimation, is 45 to  50%. The left ventricle has mildly decreased function. There is no left  ventricular hypertrophy.    2. The left ventricle demonstrates regional wall motion abnormalities.  3. Inferior basal hypokinesis.  4. Global right ventricle has normal systolic function.The right  ventricular size is normal. No increase in right ventricular wall  thickness.  5. Left atrial size was normal.  6. Right atrial size was normal.  7. Mild mitral annular calcification.  8. The mitral valve is normal in structure. Trivial mitral valve  regurgitation. No evidence of mitral stenosis.  9. The tricuspid valve is normal in structure.  10. The tricuspid valve is normal in structure. Tricuspid valve  regurgitation is not demonstrated.  11. The aortic valve is normal in structure. Aortic valve regurgitation is  not visualized. Mild to moderate aortic valve sclerosis/calcification  without any evidence of aortic stenosis.  12. The pulmonic valve was normal in structure. Pulmonic valve  regurgitation is not visualized.  13. The inferior vena cava is normal in size with greater than 50%  respiratory variability, suggesting right atrial pressure of 3 mmHg.    BRIEF CARDIAC CATHETERIZATION NOTE  06/05/2019  2:41 PM  PATIENT:  John Douglas  64 y.o. male  PRE-OPERATIVE DIAGNOSIS:  NSTEMI, VT  POST-OPERATIVE DIAGNOSIS:  Same  PROCEDURE:  Procedure(s): LEFT HEART CATH AND CORONARY ANGIOGRAPHY (N/A)  SURGEON:  Surgeon(s) and Role:    Nelva Bush, MD - Primary  FINDINGS: 1. Severe single-vessel CAD with sequential 40% proximal and tandem 80-90% mid LAD stenoses, as well as 70% ostial D2 lesion. 2. Moderate, non-obstructive disease involving LCx. 3. Widely patent overlapping stents from the proximal through distal RCA. 4. Normal LVEDP. 5. Successful PCI to mid LAD using Synergy 2.5 x 38 mm drug-eluting stent (post-dilated to 2.9 mm) with 0% residual stenosis and TIMI-3 flow.  RECOMMENDATIONS: 1. Continue aspirin and ticagrelor; will need to weigh risks and benefits of switching back  to apixaban and ticagrelor given report of hematochezia. 2. Aggressive secondary prevention.  Patient Profile     64 y.o. male with a hx of hypertension, hyperlipidemia,A. fib, prediabetes, obesity,cardiomyopathy (EF 45%), and recent STEMI with CAD s/p PCI DES x 3 LAD 12/2020who is being seen today for the evaluation ofVTandirregular heartbeat.   Assessment & Plan    1.  Non-STEMI/coronary disease: He underwent successful PCI to the mid LAD using a drug-eluting stent on 06/05/2019.  As recommended by interventional cardiology and Dr. Judeth Cornfield note on 06/05/2019, will continue dual antiplatelet therapy with aspirin and ticagrelor.  He has an ischemic cardiomyopathy with LVEF 45 to 50%.  No evidence of heart failure.  Continue metoprolol, rosuvastatin, lisinopril, and ezetimibe.  2.  Hypertension: Blood pressure is normal.  Continue present therapy.  3.  Hyperlipidemia: Continue rosuvastatin and ezetimibe.  4.  Ventricular tachycardia: No recurrences.  Continue beta-blocker.  Ischemic cardiomyopathy with LVEF of 45 to 50%.  5.  Rapid atrial fibrillation: He has since converted to sinus rhythm.  Continue beta-blocker.  As discussed in notes yesterday by general cardiology and interventional cardiology, will proceed with dual antiplatelet therapy over ticagrelor + DOAC given anatomy, intervention, and history of hematochezia.   Disposition: We will discharge to home today.  He would like to follow-up with Dr. Saunders Revel in [place].  For questions or updates, please contact [place] Please consult [email or web address] for contact info under Cardiology/STEMI.      Signed, Kate Sable, MD  06/06/2019, 9:17 AM

## 2019-06-06 NOTE — Discharge Summary (Signed)
Discharge Summary    Patient ID: John Douglas MRN: [id number]; DOB: 1955-09-21  Admit date: 06/04/2019 Discharge date: 06/06/2019  Primary Care Provider: Leonides Sake, MD  Primary Cardiologist: Buford Dresser, MD   Discharge Diagnoses    Active Problems:   Ventricular tachycardia [facility])   Non-ST elevation (NSTEMI) myocardial infarction ([place])   PAF (paroxysmal atrial fibrillation) ([place])   Essential hypertension   Hyperlipidemia LDL goal <70  Diagnostic Studies/Procedures    LHC 06/05/19:  Conclusions: 1. Significant single-vessel coronary artery disease with diffuse mid LAD disease of up to 80-90%, extending into the ostium of D1. 2. Mild to moderate, non-obstructive coronary artery disease involving the proximal LAD, LCx and RCA. 3. Widely patent overlapping stents in the RCA extending from the ostium through the distal vessel. 4. Successful PCI to the mid LAD using Synergy 2.5 x 38 mm drug-eluting stent (post-dilated to 2.9 mm) with 0% residual stenosis and TIMI-3 flow.  Jailed D2 branch exhibits 90% ostial stenosis but TIMI-3 flow.  This branch was not intervened upon.  Recommendations: 1. Dual antiplatelet therapy with aspirin and ticagrelor for at least 3 months, at which point we could consider stopping aspirin given report of hematochezia. 2. I favor holding apixaban going further, as I worry that the risk of GI bleeding outweight the benefit at this time. 3. Aggressive secondary prevention. _____________  Echocardiogram 06/04/2019:  1. Left ventricular ejection fraction, by visual estimation, is 45 to  50%. The left ventricle has mildly decreased function. There is no left  ventricular hypertrophy.  2. The left ventricle demonstrates regional wall motion abnormalities.  3. Inferior basal hypokinesis.  4. Global right ventricle has normal systolic function.The right  ventricular size is normal. No increase in right ventricular wall  thickness.  5. Left  atrial size was normal.  6. Right atrial size was normal.  7. Mild mitral annular calcification.  8. The mitral valve is normal in structure. Trivial mitral valve  regurgitation. No evidence of mitral stenosis.  9. The tricuspid valve is normal in structure.  10. The tricuspid valve is normal in structure. Tricuspid valve  regurgitation is not demonstrated.  11. The aortic valve is normal in structure. Aortic valve regurgitation is  not visualized. Mild to moderate aortic valve sclerosis/calcification  without any evidence of aortic stenosis.  12. The pulmonic valve was normal in structure. Pulmonic valve  regurgitation is not visualized.  13. The inferior vena cava is normal in size with greater than 50%  respiratory variability, suggesting right atrial pressure of 3 mmHg.   History of Present Illness     John Douglas is a 64 y.o. male with a hx of hypertension, hyperlipidemia, A. fib, prediabetes, obesity, cardiomyopathy (EF 45%), and recent STEMI with CAD s/p PCI DES x 3 LAD 03/2019 who is being seen today for the evaluation of VT and irregular heartbeat at the request of Dr. Rex Kras.  Hospital Course     John Douglas has not been seen by heart care in the past. He was recently seen by cardiology at [facility]. On 12/29 he presented with a STEMI at [facility] and found to have 100% RCA stenosis with residual disease in the LCx and LAD.  He received PCI x 3 to the RCA with residual disease with medical management.  Echo showed EF of 45%, akinesis of the inferior wall, left atrium mildly dilated, mildly reduced RV function. he was started on Brilinta given that he was already on Eliquis for A. Fib.   The  patient was seen 06/04/19 in an outpatient GI follow-up for bright red blood in the stool.  He was noted to be hypotensive and tachycardic, suspected A. Fib. He said he had mild chest discomfort 1/10 and was diaphoretic. No SOB.  Patient was transferred to [facility] via EMS for irregular heart rhythm.  On route to the ED was noted to be a wide-complex cardia (possibly VT) HR 180s and was started on amiodarone infusion.  In the ED he appeared to be in A. fib RVR.  In the ED blood pressure 122/76, pulse 125, afebrile, respiratory rate 20, 97% O2.  Labs show potassium 3.8, sodium 140, CO2 20, glucose 227, creatinine 1.43, BUN 24.  LFTs wnl.  WBC 12 hemoglobin 12.1. HS troponin 35.  Covid pending.  Chest x-ray non-acute. EKG shows Afib RVR, 116 bpm, with TWI inferolateral leads which could also be repol abnormalities given rate.   Plan was to hold his apixaban with plans for cath which was performed 06/05/19 which sowed significant single vessel CAD with diffuse mLAD disease with up to 80-90% stenosis extending into the ostium of D1. There was mild to moderate, non-obstructive CAD in the LAD, LCx and RCA territories. There was widely patent overlapping stents in the RCA extending from the ostium through the distal vessel. A successful PCI to the mid LAD using Synergy 2.5 x 38 mm drug-eluting stent (post-dilated to 2.9 mm) with 0% residual stenosis and TIMI-3 flow. Jailed D2 branch exhibits 90% ostial stenosis but TIMI-3 flow. This branch was not intervened upon.  Recommendations were for DAPT with ASA and ticagrelor for at least 3 months, at which point we could consider stopping aspirin given report of hematochezia. Dr. Saunders Revel favored holding apixaban going further given the risk of GI bleeding. Should continue with aggressive secondary prevention.  The patient had no complications in the postprocedural setting.  He is ambulated with cardiac rehabilitation without symptoms.  Cath site is unremarkable.  Hospital problems include:  -Non-STEMI/coronary disease: He underwent successful PCI to the mid LAD using a drug-eluting stent on 06/05/2019.  As recommended by interventional cardiology and Dr. Judeth Cornfield note on 06/05/2019, will continue dual antiplatelet therapy with aspirin and ticagrelor.  He has an  ischemic cardiomyopathy with LVEF 45 to 50%.  No evidence of heart failure.  Continue metoprolol, rosuvastatin, lisinopril, and ezetimibe.  -Hypertension: Blood pressure is normal.  Continue present therapy.  -Hyperlipidemia: Continue rosuvastatin and ezetimibe.  -Ventricular tachycardia: No recurrences.  Continue beta-blocker.  Ischemic cardiomyopathy with LVEF of 45 to 50%.  -Rapid atrial fibrillation: He has since converted to sinus rhythm.  Continue beta-blocker.  As discussed in notes yesterday by general cardiology and interventional cardiology, will proceed with dual antiplatelet therapy over ticagrelor + DOAC given anatomy, intervention, and history of hematochezia.  Consultants: None    The patient was seen and examined by Dr. Bronson Ing who feels that he is stable and ready for discharge today, 06/06/19. Cath site unremarkable. Will send scheduling a message for follow-up appointment.  At that time, the office will call the patient with date and time of follow-up appointment.  Did the patient have an acute coronary syndrome (MI, NSTEMI, STEMI, etc) this admission?:  Yes                               AHA/ACC Clinical Performance & Quality Measures: 4. Aspirin prescribed? - Yes 5. ADP Receptor Inhibitor (Plavix/Clopidogrel, Brilinta/Ticagrelor or Effient/Prasugrel) prescribed (includes  medically managed patients)? - Yes 6. Beta Blocker prescribed? - Yes 7. High Intensity Statin (Lipitor 40-80mg  or Crestor 20-40mg ) prescribed? - Yes 8. EF assessed during THIS hospitalization? - Yes 9. For EF <40%, was ACEI/ARB prescribed? - Not Applicable (EF >/= [id number]) 10. For EF <40%, Aldosterone Antagonist (Spironolactone or Eplerenone) prescribed? - Not Applicable (EF >/= [id number]) 11. Cardiac Rehab Phase II ordered (Included Medically managed Patients)? - Yes   _____________  Discharge Vitals Blood pressure (!) 157/81, pulse 73, temperature 99.3 F (37.4 C), temperature source Oral, resp. rate  14, height 5\' 10"  (1.778 m), weight 95.3 kg, SpO2 97 %.  Filed Weights   06/04/19 1700 06/05/19 0300 06/06/19 0436  Weight: 95.8 kg 95.3 kg 95.3 kg    Labs & Radiologic Studies    CBC Recent Labs    06/05/19 0337 06/06/19 0351  WBC 12.4* 8.9  HGB 11.4* 10.5*  HCT 34.7* 32.2*  MCV 85.0 85.6  PLT 291 [id number]   Basic Metabolic Panel Recent Labs    06/04/19 1118 06/04/19 1808 06/05/19 0337 06/06/19 0351  NA   < >  --  144 143  K   < >  --  3.7 4.5  CL   < >  --  111 111  CO2   < >  --  22 25  GLUCOSE   < >  --  148* 156*  BUN   < >  --  20 18  CREATININE   < >  --  1.11 1.09  CALCIUM   < >  --  9.2 9.0  MG  --  2.1  --  2.1   < > = values in this interval not displayed.   Liver Function Tests Recent Labs    06/04/19 1118  AST 19  ALT 18  ALKPHOS 48  BILITOT 1.0  PROT 6.6  ALBUMIN 3.7   No results for input(s): LIPASE, AMYLASE in the last 72 hours. High Sensitivity Troponin:   Recent Labs  Lab 06/04/19 1118 06/04/19 1330  TROPONINIHS 35* 362*    BNP Invalid input(s): POCBNP D-Dimer No results for input(s): DDIMER in the last 72 hours. Hemoglobin A1C Recent Labs    06/04/19 1808  HGBA1C 5.4   Fasting Lipid Panel Recent Labs    06/05/19 0337  CHOL 71  HDL 21*  LDLCALC 30  TRIG 99  CHOLHDL 3.4   Thyroid Function Tests Recent Labs    06/04/19 1808  TSH 0.729   _____________  CARDIAC CATHETERIZATION  Result Date: 06/05/2019 Conclusions: 1. Significant single-vessel coronary artery disease with diffuse mid LAD disease of up to 80-90%, extending into the ostium of D1. 2. Mild to moderate, non-obstructive coronary artery disease involving the proximal LAD, LCx and RCA. 3. Widely patent overlapping stents in the RCA extending from the ostium through the distal vessel. 4. Successful PCI to the mid LAD using Synergy 2.5 x 38 mm drug-eluting stent (post-dilated to 2.9 mm) with 0% residual stenosis and TIMI-3 flow.  Jailed D2 branch exhibits 90% ostial  stenosis but TIMI-3 flow.  This branch was not intervened upon. Recommendations: 1. Dual antiplatelet therapy with aspirin and ticagrelor for at least 3 months, at which point we could consider stopping aspirin given report of hematochezia. 2. I favor holding apixaban going further, as I worry that the risk of GI bleeding outweight the benefit at this time. 3. Aggressive secondary prevention. Nelva Bush, MD [facility]   DG Chest Port 1 View  Result Date:  06/04/2019 CLINICAL DATA:  Atrial fibrillation. EXAM: PORTABLE CHEST 1 VIEW COMPARISON:  Single-view of the chest 04/28/2019. FINDINGS: The lungs are clear. Heart size is normal. No pneumothorax or pleural fluid. No acute or focal bony abnormality. IMPRESSION: Negative chest. Electronically Signed   By: Inge Rise M.D.   On: 06/04/2019 12:23   ECHOCARDIOGRAM COMPLETE  Result Date: 06/04/2019   ECHOCARDIOGRAM REPORT   Patient Name:   John Douglas Date of Exam: 06/04/2019 Medical Rec #:  [id number]     Height:       70.0 in Accession #:    [id number]    Weight:       209.0 lb Date of Birth:  07-14-1955    BSA:          2.13 m Patient Age:    64 years      BP:           114/84 mmHg Patient Gender: M             HR:           81 bpm. Exam Location:  Inpatient Procedure: 2D Echo, Cardiac Doppler and Color Doppler STAT ECHO Indications:    Ventricular Tachycardia I47.2  History:        Patient has no prior history of Echocardiogram examinations.                 Risk Factors:Non-Smoker.  Sonographer:    Vickie Epley RDCS Referring Phys: [id number] [place]  1. Left ventricular ejection fraction, by visual estimation, is 45 to 50%. The left ventricle has mildly decreased function. There is no left ventricular hypertrophy.  2. The left ventricle demonstrates regional wall motion abnormalities.  3. Inferior basal hypokinesis.  4. Global right ventricle has normal systolic function.The right ventricular size is normal. No increase in right  ventricular wall thickness.  5. Left atrial size was normal.  6. Right atrial size was normal.  7. Mild mitral annular calcification.  8. The mitral valve is normal in structure. Trivial mitral valve regurgitation. No evidence of mitral stenosis.  9. The tricuspid valve is normal in structure. 10. The tricuspid valve is normal in structure. Tricuspid valve regurgitation is not demonstrated. 11. The aortic valve is normal in structure. Aortic valve regurgitation is not visualized. Mild to moderate aortic valve sclerosis/calcification without any evidence of aortic stenosis. 12. The pulmonic valve was normal in structure. Pulmonic valve regurgitation is not visualized. 13. The inferior vena cava is normal in size with greater than 50% respiratory variability, suggesting right atrial pressure of 3 mmHg. FINDINGS  Left Ventricle: Left ventricular ejection fraction, by visual estimation, is 45 to 50%. The left ventricle has mildly decreased function. The left ventricle demonstrates regional wall motion abnormalities. The left ventricular internal cavity size was the left ventricle is normal in size. There is no left ventricular hypertrophy. Left ventricular diastolic parameters were normal. Normal left atrial pressure. Inferior basal hypokinesis. Right Ventricle: The right ventricular size is normal. No increase in right ventricular wall thickness. Global RV systolic function is has normal systolic function. Left Atrium: Left atrial size was normal in size. Right Atrium: Right atrial size was normal in size Pericardium: There is no evidence of pericardial effusion. Mitral Valve: The mitral valve is normal in structure. There is mild thickening of the mitral valve leaflet(s). There is mild calcification of the mitral valve leaflet(s). Mild mitral annular calcification. Trivial mitral valve regurgitation. No evidence  of mitral valve stenosis  by observation. Tricuspid Valve: The tricuspid valve is normal in structure.  Tricuspid valve regurgitation is not demonstrated. Aortic Valve: The aortic valve is normal in structure. Aortic valve regurgitation is not visualized. Mild to moderate aortic valve sclerosis/calcification is present, without any evidence of aortic stenosis. Pulmonic Valve: The pulmonic valve was normal in structure. Pulmonic valve regurgitation is not visualized. Pulmonic regurgitation is not visualized. Aorta: The aortic root, ascending aorta and aortic arch are all structurally normal, with no evidence of dilitation or obstruction. Venous: The inferior vena cava is normal in size with greater than 50% respiratory variability, suggesting right atrial pressure of 3 mmHg. IAS/Shunts: No atrial level shunt detected by color flow Doppler. There is no evidence of a patent foramen ovale. No ventricular septal defect is seen or detected. There is no evidence of an atrial septal defect.  LEFT VENTRICLE PLAX 2D LVIDd:         5.10 cm LVIDs:         4.10 cm LV PW:         0.90 cm LV IVS:        0.90 cm LV SV:         50 ml LV SV Index:   22.67  LV Volumes (MOD) LV area d, A2C:    35.40 cm LV area d, A4C:    40.20 cm LV area s, A2C:    24.70 cm LV area s, A4C:    26.10 cm LV major d, A2C:   7.91 cm LV major d, A4C:   8.26 cm LV major s, A2C:   6.99 cm LV major s, A4C:   6.91 cm LV vol d, MOD A2C: 132.0 ml LV vol d, MOD A4C: 161.0 ml LV vol s, MOD A2C: 75.6 ml LV vol s, MOD A4C: 82.7 ml LV SV MOD A2C:     56.4 ml LV SV MOD A4C:     161.0 ml LV SV MOD BP:      69.5 ml RIGHT VENTRICLE TAPSE (M-mode): 1.4 cm LEFT ATRIUM             Index       RIGHT ATRIUM           Index LA diam:        3.70 cm 1.74 cm/m  RA Area:     12.80 cm LA Vol (A2C):   33.0 ml 15.52 ml/m RA Volume:   30.80 ml  14.48 ml/m LA Vol (A4C):   42.9 ml 20.17 ml/m LA Biplane Vol: 37.2 ml 17.49 ml/m  AORTIC VALVE LVOT Vmax:   75.10 cm/s LVOT Vmean:  51.900 cm/s LVOT VTI:    0.129 m  AORTA Ao Root diam: 3.40 cm  SHUNTS Systemic VTI: 0.13 m  Jenkins Rouge  MD Electronically signed by Jenkins Rouge MD Signature Date/Time: 06/04/2019/3:55:57 PM    Final    Disposition   Pt is being discharged home today in good condition.  Follow-up Plans & Appointments   Follow-up Information    End, Harrell Gave, MD Follow up.   Specialty: Cardiology Why: The office will call you with the date and time of your follow-up appointment.  If you have not heard from them by Tuesday or Wednesday of next week please give them a call at the number above. Contact information: [place] [address] [phone]          Discharge Instructions    Amb Referral to Cardiac Rehabilitation  Complete by: As directed    Diagnosis:  Coronary Stents NSTEMI     After initial evaluation and assessments completed: Virtual Based Care may be provided alone or in conjunction with Phase 2 Cardiac Rehab based on patient barriers.: Yes   Call MD for:  difficulty breathing, headache or visual disturbances   Complete by: As directed    Call MD for:  extreme fatigue   Complete by: As directed    Call MD for:  hives   Complete by: As directed    Call MD for:  persistant dizziness or light-headedness   Complete by: As directed    Call MD for:  persistant nausea and vomiting   Complete by: As directed    Call MD for:  redness, tenderness, or signs of infection (pain, swelling, redness, odor or green/yellow discharge around incision site)   Complete by: As directed    Call MD for:  severe uncontrolled pain   Complete by: As directed    Call MD for:  temperature >100.4   Complete by: As directed    Diet - low sodium heart healthy   Complete by: As directed    Discharge instructions   Complete by: As directed    No driving for 3-5 days. No lifting over 5 lbs for 1 week. No sexual activity for 1 week. Keep procedure site clean & dry. If you notice increased pain, swelling, bleeding or pus, call/return!  You may shower, but no soaking baths/hot  tubs/pools for 1 week.   PLEASE DO NOT MISS ANY DOSES OF YOUR BRILINTA!!!!! Also keep a log of you blood pressures and bring back to your follow up appt. Please call the office with any questions.   Patients taking blood thinners should generally stay away from medicines like ibuprofen, Advil, Motrin, naproxen, and Aleve due to risk of stomach bleeding. You may take Tylenol as directed or talk to your primary doctor about alternatives.  Some studies suggest Prilosec/Omeprazole interacts with Plavix. If you have reflux, then please use Protonix for less chance of interaction.   Our office will call you early and let you know the date and time of your appointment. If you have not heard from them by Tuesday or Wednesday, please give Dr. Saunders Revel office a call.   We have stopped your Eliquis due to the risk of bleeding with the Brilinta.   Increase activity slowly   Complete by: As directed       Discharge Medications   Allergies as of 06/06/2019      Reactions   Aspirin Other (See Comments)   Caused bloody stools   Ibuprofen Other (See Comments)   Was told to not take this    Naproxen Other (See Comments)   Was told to not take this    Penicillins Hives   Did it involve swelling of the face/tongue/throat, SOB, or low BP? Unk Did it involve sudden or severe rash/hives, skin peeling, or any reaction on the inside of your mouth or nose? Yes Did you need to seek medical attention at a hospital or doctor's office? Unk When did it last happen?"Childhood- 55 years ago" If all above answers are "NO", may proceed with cephalosporin use.   Tylenol [acetaminophen] Other (See Comments)   Was told to not take this    Yellow Jacket Venom [bee Venom] Swelling, Other (See Comments)   Severe swelling where stung      Medication List    STOP taking these medications  apixaban 5 MG Tabs tablet Commonly known as: ELIQUIS     TAKE these medications   aspirin 81 MG chewable tablet Chew 1 tablet (81  mg total) by mouth daily. Start taking on: June 07, 2019   ezetimibe 10 MG tablet Commonly known as: ZETIA Take 10 mg by mouth at bedtime.   fluticasone 50 MCG/ACT nasal spray Commonly known as: FLONASE Place 1-2 sprays into both nostrils daily as needed for allergies or rhinitis (or seasonal allergies).   lisinopril 2.5 MG tablet Commonly known as: ZESTRIL Take 2.5 mg by mouth daily.   metoprolol tartrate 50 MG tablet Commonly known as: LOPRESSOR Take 50 mg by mouth 2 (two) times daily.   nitroGLYCERIN 0.4 MG SL tablet Commonly known as: NITROSTAT Place 0.4 mg under the tongue every 5 (five) minutes as needed for chest pain.   rosuvastatin 40 MG tablet Commonly known as: CRESTOR Take 40 mg by mouth at bedtime.   ticagrelor 90 MG Tabs tablet Commonly known as: BRILINTA Take 90 mg by mouth 2 (two) times daily.        Outstanding Labs/Studies   CBC  Duration of Discharge Encounter   Greater than 30 minutes including physician time.  Signed, Kathyrn Drown, NP 06/06/2019, 10:58 AM

## 2019-06-06 NOTE — Progress Notes (Signed)
Patient had an uneventful day. He is being discharged. IVs have been discontinued (clean, dry, intact). Will continue to monitor.

## 2019-06-09 ENCOUNTER — Telehealth: Payer: Self-pay

## 2019-06-09 NOTE — Telephone Encounter (Signed)
OK 

## 2019-06-09 NOTE — Telephone Encounter (Signed)
-----   Message from Gatha Mayer, MD sent at 06/08/2019 12:58 PM EST ----- Regarding: RE: F/U request I was thinking we should see him in f/u later this month (approx late Feb, early March)and get a signs/sxs update and triage to timing of procedure  I could find a spot in March if necessary or ask a partner depending upon the urgency of the procedure  Thanks ----- Message ----- From: Alfredia Ferguson, PA-C Sent: 06/08/2019  11:34 AM EST To: Gatha Mayer, MD, Greggory Keen, LPN Subject: RE: F/U request                                [facility] - he just had an MI an had new stents placed -, he also had an MI on 12/29 / 24 -his hgb was stable .We would have to do procedures at hospital  and your March schedule is already full -   I can see him back in office  but if hgb stable would you wait  a couple months to do procedure ?  Beth - I saw this pt and sent him to hospital last week - he  had cath, and new stents placed . He needs CBC next week , then will determine timing of coming back for visit vs scheduling Colonoscopy- thanks ----- Message ----- From: Gatha Mayer, MD Sent: 06/07/2019  12:57 PM EST To: Alfredia Ferguson, PA-C Subject: F/U request                                    Cardiology stopped his Xa inhibitor and he is on ASA and Ticagrelor  Can you please do the following:  1) start him on pantoprazole  2) Arrange for a f/u to recheck bleeding and determine a plan for w/u  If he needs to be scoped I will do it on his ticagrelor and asa  Thanks

## 2019-06-09 NOTE — Telephone Encounter (Signed)
Spoke with the patient in detail about coming in for an office visit with Dr Carlean Purl. Patient declines. He states he will follow up with his cardiologist in a couple of weeks. Offered an appointment after the cardiology appointment. The patient adamantly declines, stating "No I do not want to schedule an appointment." Offered that he call back when he is ready to schedule. The patient hangs up the phone without agreeing or disagreeing.

## 2019-06-17 ENCOUNTER — Other Ambulatory Visit: Payer: Self-pay

## 2019-06-17 ENCOUNTER — Encounter: Payer: Self-pay | Admitting: Internal Medicine

## 2019-06-17 ENCOUNTER — Ambulatory Visit: Payer: Medicare HMO | Admitting: Internal Medicine

## 2019-06-17 VITALS — BP 142/82 | HR 59 | Ht 70.0 in | Wt 212.5 lb

## 2019-06-17 DIAGNOSIS — I472 Ventricular tachycardia, unspecified: Secondary | ICD-10-CM

## 2019-06-17 DIAGNOSIS — I10 Essential (primary) hypertension: Secondary | ICD-10-CM

## 2019-06-17 DIAGNOSIS — I255 Ischemic cardiomyopathy: Secondary | ICD-10-CM

## 2019-06-17 DIAGNOSIS — I2510 Atherosclerotic heart disease of native coronary artery without angina pectoris: Secondary | ICD-10-CM

## 2019-06-17 DIAGNOSIS — I480 Paroxysmal atrial fibrillation: Secondary | ICD-10-CM

## 2019-06-17 DIAGNOSIS — K921 Melena: Secondary | ICD-10-CM

## 2019-06-17 NOTE — Progress Notes (Signed)
Follow-up Outpatient Visit Date: 06/17/2019  Primary Care Provider: Leonides Sake, MD [place] [address]  Chief Complaint: Follow-up coronary artery disease and ventricular tachycardia  HPI:  John Douglas is a 64 y.o. male with history of coronary artery disease with inferior STEMI and primary PCI to the RCA in [id number] complicated by sustained ventricular tachycardia in the setting of residual left coronary artery disease status post subsequent PCI to the mid LAD (06/2019), hypertension, hyperlipidemia, atrial fibrillation, and ischemic cardiomyopathy, who presents for follow-up of coronary artery disease.  He was hospitalized at [facility] after developing acute onset of chest pain while traveling to [facility] for a GI appointment to evaluate recurrent hematochezia.  He was noted to have ventricular tachycardia at the gastroenterology office, which terminated with IV amiodarone.  He was brought to the ER for further evaluation and noted to have a modest bump in high-sensitivity troponin.  Catheterization revealed patent stents in the RCA, multifocal proximal through mid LAD disease of up to 90% and moderate, nonobstructive LCx disease.  He underwent successful PCI to the mid LAD without further VT.  Today, John Douglas reports that he has been feeling well other than occasional orthostatic lightheadedness and vertigo when turning in bed.  Episodes only last for a few seconds.  He has not fallen.  He denies chest pain, shortness of breath, and palpitations.  He still notes occasional bright red blood per rectum, though it seems stable to improved since leaving the hospital.  Home blood pressures are usually in the 130s over 80s.  He has not been contacted by cardiac rehab but worries about the cost of attending the program.  He has deferred reevaluation by GI until cleared by [place] to proceed with endoscopy.  He reports occasional lower abdominal discomfort that he describes as gas  pains.  --------------------------------------------------------------------------------------------------  Past Medical History:  Diagnosis Date  . Allergic rhinitis    [facility]  . Anxiety   . Atrial fibrillation ([place])    [facility]  . Benign essential hypertension    [facility]  . Coronary artery disease    [facility]  . Diabetic neuropathy, type II diabetes mellitus ([place])    [facility]  . Mixed hyperlipidemia    [facility]  . Myocardial infarct [facility])    [facility]  . Obesity, unspecified    [facility]  . Rectal bleeding    [facility]  . Wide-complex tachycardia ([place]) 06/04/2019   Past Surgical History:  Procedure Laterality Date  . CORONARY ANGIOPLASTY     3 stents  . LEFT HEART CATH AND CORONARY ANGIOGRAPHY N/A 06/05/2019   Procedure: LEFT HEART CATH AND CORONARY ANGIOGRAPHY;  Surgeon: Nelva Bush, MD;  Location: [facility];  Service: Cardiovascular;  Laterality: N/A;    Current Meds  Medication Sig  . aspirin 81 MG chewable tablet Chew 1 tablet (81 mg total) by mouth daily.  Marland Kitchen ezetimibe (ZETIA) 10 MG tablet Take 10 mg by mouth at bedtime.  . fluticasone (FLONASE) 50 MCG/ACT nasal spray Place 1-2 sprays into both nostrils daily as needed for allergies or rhinitis (or seasonal allergies).  Marland Kitchen lisinopril (ZESTRIL) 2.5 MG tablet Take 2.5 mg by mouth daily.  . metoprolol tartrate (LOPRESSOR) 50 MG tablet Take 50 mg by mouth 2 (two) times daily.  . nitroGLYCERIN (NITROSTAT) 0.4 MG SL tablet Place  0.4 mg under the tongue every 5 (five) minutes as needed for chest pain.   . rosuvastatin (CRESTOR) 40 MG tablet Take 40 mg by mouth at bedtime.  . ticagrelor (BRILINTA) 90 MG TABS tablet Take 90 mg by mouth 2 (two) times daily.    Allergies: Aspirin, Ibuprofen, Naproxen,  Penicillins, Tylenol [acetaminophen], and Yellow jacket venom [bee venom]  Social History   Tobacco Use  . Smoking status: Never Smoker  . Smokeless tobacco: Former Systems developer    Types: Chew  Substance Use Topics  . Alcohol use: Not Currently    Comment: occasional  . Drug use: Not Currently    Types: Marijuana    Family History  Problem Relation Age of Onset  . Hypertension Father   . Heart disease Father        MI  . Prostate cancer Father     Review of Systems: A 12-system review of systems was performed and was negative except as noted in the HPI.  --------------------------------------------------------------------------------------------------  Physical Exam: BP (!) 142/82 (BP Location: Left Arm, Patient Position: Sitting, Cuff Size: Normal)   Pulse (!) 59   Ht 5\' 10"  (1.778 m)   Wt 212 lb 8 oz (96.4 kg)   SpO2 99%   BMI 30.49 kg/m   General:  NAD. Neck: No JVD or HJR. Lungs: Normal work of breathing. Clear to auscultation bilaterally without wheezes or crackles. Heart: Regular rate and rhythm without murmurs, rubs, or gallops. Abd: Bowel sounds present. Soft, NT/ND. Ext: No lower extremity edema.  Right radial arteriotomy site well-healed with 2+ pulse.  EKG: Sinus bradycardia (heart rate 59 bpm) with inferolateral T wave inversions.  No significant change from prior tracing on 06/06/2019.  Lab Results  Component Value Date   WBC 8.9 06/06/2019   HGB 10.5 (L) 06/06/2019   HCT 32.2 (L) 06/06/2019   MCV 85.6 06/06/2019   PLT 244 06/06/2019    Lab Results  Component Value Date   NA 143 06/06/2019   K 4.5 06/06/2019   CL 111 06/06/2019   CO2 25 06/06/2019   BUN 18 06/06/2019   CREATININE 1.09 06/06/2019   GLUCOSE 156 (H) 06/06/2019   ALT 18 06/04/2019    Lab Results  Component Value Date   CHOL 71 06/05/2019   HDL 21 (L) 06/05/2019   LDLCALC 30 06/05/2019   TRIG 99 06/05/2019   CHOLHDL 3.4 06/05/2019     --------------------------------------------------------------------------------------------------  ASSESSMENT AND PLAN: Coronary artery disease: John Douglas is doing well without recurrent angina or palpitations/chest pain as he experienced in the setting of sustained ventricular tachycardia.  We will continue dual antiplatelet therapy with aspirin and ticagrelor.  I would favor continuing this for at least 3 months from the time of his most recent PCI until we consider temporary cessation of ticagrelor to allow for endoscopy.  If he has worsening bleeding, we may need to consider this sooner.  John Douglas is ambivalent about cardiac rehab and wishes to continue exercising/staying active on his own.  Ventricular tachycardia: EKG today shows sinus bradycardia.  John Douglas has not had any further palpitations or chest pain as he experienced in the setting of sustained ventricular tachycardia and incomplete coronary revascularization.  Hopefully, now that he is status post PCI to the LAD and on reasonable dose of beta-blockade, he will not have any further arrhythmia.  Paroxysmal atrial fibrillation: No symptoms to suggest recurrence.  EKG today again shows sinus rhythm.  I suspect atrial fibrillation was precipitated by STEMI  in 03/2019.  Given issues with hematochezia and multiple recent stent placements, I favor withholding apixaban for the time being.  If he were to have recurrent atrial fibrillation, we would need to consider transitioning to apixaban and clopidogrel.  Ischemic cardiomyopathy: LVEF mildly reduced by echo on 06/04/2019 with basal inferior hypokinesis.  Continue current regimen of lisinopril and metoprolol.  Plan to repeat CMP when patient returns for follow-up in 2 months.  Hyperlipidemia: John Douglas is tolerating rosuvastatin and ezetimibe well.  We will recheck a lipid panel and CMP when he returns for follow-up in 2 months to ensure his LDL is less than  70.  Hematochezia: Relatively stable since recent hospitalization.  Continue follow-up with GI.  If possible, I would like to defer discontinuation of ticagrelor to allow for colonoscopy until at least 3 months from most recent PCI on 06/05/2019.  We will recheck a CBC when he returns for follow-up.  Follow-up: Return to clinic in 2 months.  Nelva Bush, MD 06/17/2019 1:11 PM

## 2019-06-17 NOTE — Patient Instructions (Signed)
Medication Instructions:  Your physician recommends that you continue on your current medications as directed. Please refer to the Current Medication list given to you today.  *If you need a refill on your cardiac medications before your next appointment, please call your pharmacy*  Lab Work: none If you have labs (blood work) drawn today and your tests are completely normal, you will receive your results only by: Marland Kitchen MyChart Message (if you have MyChart) OR . A paper copy in the mail If you have any lab test that is abnormal or we need to change your treatment, we will call you to review the results.  Testing/Procedures: none  Follow-Up: At [facility], you and your health needs are our priority.  As part of our continuing mission to provide you with exceptional heart care, we have created designated Provider Care Teams.  These Care Teams include your primary Cardiologist (physician) and Advanced Practice Providers (APPs -  Physician Assistants and Nurse Practitioners) who all work together to provide you with the care you need, when you need it.  Your next appointment:   2 month(s) in early morning so you may be fasting and get lab work.  The format for your next appointment:   In Person  Provider:    You may see DR Harrell Gave END or one of the following Advanced Practice Providers on your designated Care Team:    Murray Hodgkins, NP  Christell Faith, PA-C  Marrianne Mood, PA-C

## 2019-08-19 LAB — COLOGUARD: Cologuard: POSITIVE — AB

## 2019-08-28 ENCOUNTER — Ambulatory Visit (INDEPENDENT_AMBULATORY_CARE_PROVIDER_SITE_OTHER): Payer: Medicare HMO | Admitting: Internal Medicine

## 2019-08-28 ENCOUNTER — Other Ambulatory Visit: Payer: Self-pay

## 2019-08-28 ENCOUNTER — Encounter: Payer: Self-pay | Admitting: Internal Medicine

## 2019-08-28 VITALS — BP 120/80 | HR 99 | Ht 70.0 in | Wt 217.0 lb

## 2019-08-28 DIAGNOSIS — I10 Essential (primary) hypertension: Secondary | ICD-10-CM

## 2019-08-28 DIAGNOSIS — R1084 Generalized abdominal pain: Secondary | ICD-10-CM | POA: Diagnosis not present

## 2019-08-28 DIAGNOSIS — I255 Ischemic cardiomyopathy: Secondary | ICD-10-CM | POA: Diagnosis not present

## 2019-08-28 DIAGNOSIS — I472 Ventricular tachycardia, unspecified: Secondary | ICD-10-CM

## 2019-08-28 DIAGNOSIS — I480 Paroxysmal atrial fibrillation: Secondary | ICD-10-CM | POA: Diagnosis not present

## 2019-08-28 DIAGNOSIS — I2510 Atherosclerotic heart disease of native coronary artery without angina pectoris: Secondary | ICD-10-CM

## 2019-08-28 DIAGNOSIS — E785 Hyperlipidemia, unspecified: Secondary | ICD-10-CM | POA: Diagnosis not present

## 2019-08-28 DIAGNOSIS — K922 Gastrointestinal hemorrhage, unspecified: Secondary | ICD-10-CM

## 2019-08-28 MED ORDER — CLOPIDOGREL BISULFATE 75 MG PO TABS: 75.0000 mg | ORAL_TABLET | Freq: Every day | ORAL | 3 refills | Status: DC

## 2019-08-28 MED ORDER — METOPROLOL TARTRATE 50 MG PO TABS: 75.0000 mg | ORAL_TABLET | Freq: Two times a day (BID) | ORAL | 3 refills | Status: DC

## 2019-08-28 NOTE — Progress Notes (Signed)
Follow-up Outpatient Visit Date: 08/28/2019  Primary Care Provider: Leonides Sake, [place] [id number]  Chief Complaint: Fatigue and shortness of breath  BP:[id number]  John Douglas is a 64 y.o. male with history of coronary artery disease with inferior STEMI and primary PCI to the RCA in [id number] complicated by sustained ventricular tachycardia in the setting of residual left coronary artery disease status post subsequent PCI to the mid LAD (06/2019), hypertension, hyperlipidemia, atrial fibrillation, and ischemic cardiomyopathy, who presents for follow-up of coronary artery disease.  I last saw John Douglas in mid February following his hospitalization for sustained VT and subsequent PCI to the LAD at [facility].  At that time, he was feeling well other than occasional orthostatic lightheadedness and vertigo.  He continued to note intermittent BRBPR and was undergoing GI evaluation.  Today, John Douglas most bothered by ongoing GI issues.  He has chronic abdominal pain as well as intermittent blood in his stools.  He is scheduled for follow-up with with [facility] in mid May.  He has noticed slight increase in shortness of breath since our last visit.  He wonders if Brilinta could be contributing to this.  He has not had any chest pain, palpitations, lightheadedness, or edema.  John Douglas also complains of low back pain, which is chronic, but may be a little worse than baseline.  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  He typically sleeps on multiple pillows and feels like his breathing has been better when elevating his head.  Weight has been stable.  --------------------------------------------------------------------------------------------------  Past Medical History:  Diagnosis Date  . Allergic rhinitis    [facility]  . Anxiety   . Atrial fibrillation ([place])    [facility]  . Benign essential hypertension    [facility]  . Coronary artery disease    [facility]  . Diabetic neuropathy, type II diabetes mellitus ([place])    [facility]  . Mixed hyperlipidemia    [facility]  . Myocardial infarct [facility])    [facility]  . Obesity, unspecified    [facility]  . Rectal bleeding     [facility]  . Wide-complex tachycardia ([place]) 06/04/2019   Past Surgical History:  Procedure Laterality Date  . CORONARY ANGIOPLASTY     3 stents  . LEFT HEART CATH AND CORONARY ANGIOGRAPHY N/A 06/05/2019   Procedure: LEFT HEART CATH AND CORONARY ANGIOGRAPHY;  Surgeon: Nelva Bush, MD;  Location: [facility];  Service: Cardiovascular;  Laterality: N/A;    Current Meds  Medication Sig  . aspirin 81 MG chewable tablet Chew 1 tablet (81 mg total) by mouth daily.  Marland Kitchen ezetimibe (ZETIA) 10 MG tablet Take 10 mg by mouth at bedtime.  . fluticasone (FLONASE) 50 MCG/ACT nasal spray Place 1-2 sprays into both nostrils daily as needed for allergies or rhinitis (or seasonal allergies).  Marland Kitchen lisinopril (ZESTRIL) 2.5 MG tablet Take 2.5 mg by mouth daily.  . metoprolol tartrate (LOPRESSOR) 50 MG tablet Take 50 mg by mouth 2 (two) times daily.  . nitroGLYCERIN (NITROSTAT) 0.4 MG SL tablet Place 0.4 mg under the tongue every 5 (five) minutes as needed for chest pain.   . rosuvastatin (CRESTOR) 40 MG tablet Take 40 mg by mouth at bedtime.  . ticagrelor (BRILINTA)  90 MG TABS tablet in the morning and at bedtime.    Allergies: Aspirin, Ibuprofen, Naproxen, Penicillins, Tylenol [acetaminophen], and Yellow jacket venom [bee venom]  Social History   Tobacco Use  . Smoking status: Never Smoker  . Smokeless tobacco: Former Systems developer    Types: Chew  Substance Use Topics  . Alcohol use: Not Currently    Comment: occasional  . Drug use:  Not Currently    Types: Marijuana    Family History  Problem Relation Age of Onset  . Hypertension Father   . Heart disease Father        MI  . Prostate cancer Father     Review of Systems: A 12-system review of systems was performed and was negative except as noted in the HPI.  --------------------------------------------------------------------------------------------------  Physical Exam: BP 120/80 (BP Location: Left Arm, Patient Position: Sitting, Cuff Size: Normal)   Pulse 99   Ht 5\' 10"  (1.778 m)   Wt 217 lb (98.4 kg)   SpO2 99%   BMI 31.14 kg/m   General: NAD. Neck: No JVD or HJR. Lungs: Clear to auscultation without wheezes or crackles. Heart: Irregularly irregular without murmurs, rubs, or gallops. Abdomen: Mild diffuse tenderness.  Soft and nondistended. Extremities: No lower extremity edema.  EKG: Atrial fibrillation with rapid ventricular response (ventricular rate 107 bpm), inferior Q waves, and inferolateral T wave inversions.  Atrial fibrillation is new since prior tracing on 06/17/2019.  Otherwise, there has been no significant interval change.  Lab Results  Component Value Date   WBC 8.9 06/06/2019   HGB 10.5 (L) 06/06/2019   HCT 32.2 (L) 06/06/2019   MCV 85.6 06/06/2019   PLT 244 06/06/2019    Lab Results  Component Value Date   NA 143 06/06/2019   K 4.5 06/06/2019   CL 111 06/06/2019   CO2 25 06/06/2019   BUN 18 06/06/2019   CREATININE 1.09 06/06/2019   GLUCOSE 156 (H) 06/06/2019   ALT 18 06/04/2019    Lab Results  Component Value Date   CHOL 71 06/05/2019   HDL 21 (L) 06/05/2019   LDLCALC 30 06/05/2019   TRIG 99 06/05/2019   CHOLHDL 3.4 06/05/2019    --------------------------------------------------------------------------------------------------  ASSESSMENT AND PLAN: Coronary artery disease without angina: John Douglas has not had any significant angina, though he does report some intermittent shortness of breath.  This could be  due to a number of factors, most notably of which is recurrent atrial fibrillation.  Given likely need to transition back to anticoagulation once GI bleeding has been completely evaluated as well as concern for dyspnea related to ticagrelor, we have agreed to transition from ticagrelor to clopidogrel 75 mg daily.  We will plan to complete at least 3 months of aspirin and clopidogrel from the time of his most recent stent placement in February.  Ischemic cardiomyopathy: John Douglas appears euvolemic though he notes slight increase in shortness of breath.  We have agreed to increase metoprolol tartrate 75 mg twice daily.  Paroxysmal atrial fibrillation: John Douglas is noted to be in atrial fibrillation with borderline elevated heart rates today in the 90s to low 100s.  This may be contributing to his recent shortness of breath.  We have agreed to increase metoprolol tartrate to 75 mg twice daily.  In the setting of continued episodic GI bleeding undergoing evaluation, I think it is best to defer initiation of anticoagulation on top of antiplatelet therapy.  Long-term, John Douglas will likely need to be on anticoagulation given  a CHA2DS2-VASc score of at least 5.  Ventricular tachycardia: No symptoms to suggest recurrence.  Hopefully, complete revascularization will prevent further VT.  We will increase metoprolol to 75 mg twice daily, as above.  Abdominal pain and GI bleeding: This has been present for several months with ongoing outpatient work-up in process.  I will check a CBC today to ensure that anemia has not worsened.  We will transition from ticagrelor to clopidogrel, as above.  I would like to complete at least 3 months of DAPT from most recent stent placement, if tolerated.  Hyperlipidemia: John Douglas reports increasing back pain and is concerned that rosuvastatin may be contributing, as he has had myalgias in the past with statins.  We will check a CMP and lipid panel today and plan for a statin  holiday to see if symptoms improve.  Follow-up: Return to clinic in 1 month.  Nelva Bush, MD 08/28/2019 8:55 AM

## 2019-08-28 NOTE — Patient Instructions (Signed)
Medication Instructions:  Your physician has recommended you make the following change in your medication:  1) STOP taking Brilinta 2) STOP taking Crestor 3) START taking Plavix (clopidogrel) 75 mg daily  4) INCREASE metoprolol 75 mg twice daily  *If you need a refill on your cardiac medications before your next appointment, please call your pharmacy*   Lab Work: TODAY: CBC, CMET, Mg, FLP If you have labs (blood work) drawn today and your tests are completely normal, you will receive your results only by: Marland Kitchen MyChart Message (if you have MyChart) OR . A paper copy in the mail If you have any lab test that is abnormal or we need to change your treatment, we will call you to review the results.  Follow-Up: At [facility], you and your health needs are our priority.  As part of our continuing mission to provide you with exceptional heart care, we have created designated Provider Care Teams.  These Care Teams include your primary Cardiologist (physician) and Advanced Practice Providers (APPs -  Physician Assistants and Nurse Practitioners) who all work together to provide you with the care you need, when you need it.  We recommend signing up for the patient portal called "MyChart".  Sign up information is provided on this After Visit Summary.  MyChart is used to connect with patients for Virtual Visits (Telemedicine).  Patients are able to view lab/test results, encounter notes, upcoming appointments, etc.  Non-urgent messages can be sent to your provider as well.   To learn more about what you can do with MyChart, go to [email or web address].    Your next appointment:   1 month(s)  The format for your next appointment:   In Person  Provider:    You may see Nelva Bush, MD or one of the following Advanced Practice Providers on your designated Care Team:    Murray Hodgkins, NP  Christell Faith, PA-C  Marrianne Mood, PA-C

## 2019-08-29 ENCOUNTER — Encounter: Payer: Self-pay | Admitting: Internal Medicine

## 2019-08-29 DIAGNOSIS — I2510 Atherosclerotic heart disease of native coronary artery without angina pectoris: Secondary | ICD-10-CM | POA: Insufficient documentation

## 2019-08-29 DIAGNOSIS — I255 Ischemic cardiomyopathy: Secondary | ICD-10-CM | POA: Insufficient documentation

## 2019-08-29 DIAGNOSIS — K922 Gastrointestinal hemorrhage, unspecified: Secondary | ICD-10-CM | POA: Insufficient documentation

## 2019-08-29 LAB — CBC
Hematocrit: 38.2 % (ref 37.5–51.0)
Hemoglobin: 12.6 g/dL — ABNORMAL LOW (ref 13.0–17.7)
MCH: 27.3 pg (ref 26.6–33.0)
MCHC: 33 g/dL (ref 31.5–35.7)
MCV: 83 fL (ref 79–97)
Platelets: 236 10*3/uL (ref 150–450)
RBC: 4.61 x10E6/uL (ref 4.14–5.80)
RDW: 15.7 % — ABNORMAL HIGH (ref 11.6–15.4)
WBC: 5.5 10*3/uL (ref 3.4–10.8)

## 2019-08-29 LAB — COMPREHENSIVE METABOLIC PANEL
ALT: 22 IU/L (ref 0–44)
AST: 19 IU/L (ref 0–40)
Albumin/Globulin Ratio: 1.9 (ref 1.2–2.2)
Albumin: 4 g/dL (ref 3.8–4.8)
Alkaline Phosphatase: 64 IU/L (ref 39–117)
BUN/Creatinine Ratio: 24 (ref 10–24)
BUN: 21 mg/dL (ref 8–27)
Bilirubin Total: 0.5 mg/dL (ref 0.0–1.2)
CO2: 20 mmol/L (ref 20–29)
Calcium: 8.8 mg/dL (ref 8.6–10.2)
Chloride: 107 mmol/L — ABNORMAL HIGH (ref 96–106)
Creatinine, Ser: 0.88 mg/dL (ref 0.76–1.27)
GFR calc Af Amer: 104 mL/min/{1.73_m2} (ref >59)
GFR calc non Af Amer: 90 mL/min/{1.73_m2} (ref >59)
Globulin, Total: 2.1 g/dL (ref 1.5–4.5)
Glucose: 231 mg/dL — ABNORMAL HIGH (ref 65–99)
Potassium: 4 mmol/L (ref 3.5–5.2)
Sodium: 140 mmol/L (ref 134–144)
Total Protein: 6.1 g/dL (ref 6.0–8.5)

## 2019-08-29 LAB — LIPID PANEL
Chol/HDL Ratio: 2.8 ratio (ref 0.0–5.0)
Cholesterol, Total: 78 mg/dL — ABNORMAL LOW (ref 100–199)
HDL: 28 mg/dL — ABNORMAL LOW (ref >39)
LDL Chol Calc (NIH): 32 mg/dL (ref 0–99)
Triglycerides: 87 mg/dL (ref 0–149)
VLDL Cholesterol Cal: 18 mg/dL (ref 5–40)

## 2019-08-29 LAB — MAGNESIUM: Magnesium: 2 mg/dL (ref 1.6–2.3)

## 2019-09-15 ENCOUNTER — Ambulatory Visit: Payer: Medicare HMO | Admitting: Internal Medicine

## 2019-09-15 ENCOUNTER — Telehealth: Payer: Self-pay

## 2019-09-15 ENCOUNTER — Encounter: Payer: Self-pay | Admitting: Internal Medicine

## 2019-09-15 VITALS — BP 138/78 | HR 64 | Ht 70.0 in | Wt 219.1 lb

## 2019-09-15 DIAGNOSIS — F418 Other specified anxiety disorders: Secondary | ICD-10-CM | POA: Diagnosis not present

## 2019-09-15 DIAGNOSIS — Z7902 Long term (current) use of antithrombotics/antiplatelets: Secondary | ICD-10-CM

## 2019-09-15 DIAGNOSIS — R194 Change in bowel habit: Secondary | ICD-10-CM | POA: Diagnosis not present

## 2019-09-15 DIAGNOSIS — K625 Hemorrhage of anus and rectum: Secondary | ICD-10-CM

## 2019-09-15 DIAGNOSIS — I214 Non-ST elevation (NSTEMI) myocardial infarction: Secondary | ICD-10-CM

## 2019-09-15 DIAGNOSIS — I480 Paroxysmal atrial fibrillation: Secondary | ICD-10-CM

## 2019-09-15 MED ORDER — CLONAZEPAM 1 MG PO TBDP: 1.0000 mg | ORAL_TABLET | Freq: Two times a day (BID) | ORAL | 0 refills | Status: DC | PRN

## 2019-09-15 NOTE — H&P (View-Only) (Signed)
John Douglas 63 y.o. 08-26-1955 [id number]  Assessment & Plan:   Encounter Diagnoses  Name Primary?  . Rectal bleeding Yes  . Change in bowel habits   . Encounter for long-term use of antiplatelets/antithrombotics   . Anticipatory anxiety   . Non-ST elevation (NSTEMI) myocardial infarction ([place])   . Paroxysmal atrial fibrillation (HCC)     The best I can tell cardiology thinks it is reasonable to temporarily hold his antiplatelet therapy and proceed with a colonoscopy in this timeframe.  I do think it is reasonable to have him done at the hospital given his recent cardiac problems.  He does not need to hold his aspirin for the procedure and I did offer to perform the procedure on his clopidogrel which does confer some increased risk of bleeding but is typically manageable by using prophylactic clipping etc. if large polyps were removed.  He does not prefer to go this way.  He had been on apixaban in the past for A. fib but that is off given all of bleeding.  Certainly could consider resuming that depending upon what I see at colonoscopy.  I do think he is at some increased risk of having a cardiac event off his clopidogrel but he has had chronic bleeding issues and change in bowel habits some of which may have been medication related but we really do not know so it is appropriate to have a colonoscopy.  He understands and accepts the risk.  The risks and benefits as well as alternatives of endoscopic procedure(s) have been discussed and reviewed. All questions answered. The patient agrees to proceed.   I have prescribed number six 1 mg disintegrating clonazepam tablets for him to use to relieve anticipatory anxiety around the time of his colonoscopy.  No refills.  I have advised him and his daughter to discuss his chronic anxiety issues with primary care.     I appreciate the opportunity to care for this patient. CC: Hamrick, Lorin Mercy, MD Dr. Harrell Gave End Subjective:   Chief  Complaint: diarrhea, rectal bleeding  HPI 64 year old white man here with his daughter, with a history of hematochezia/rectal bleeding and diarrhea, was last seen here on June 04, 2019 by Nicoletta Ba PA-C, and he had presented with hypotension and irregular tachycardia.  He was sent by EMS to the hospital and was found to have V. tach with an N ST EMI, catheterization resulted in a successful PCI to the mid LAD using a drug-eluting stent.  The patient had had 3 stents placed at [facility] just a couple of months before that.  He had been on aspirin and Brilinta and it was switched to aspirin and clopidogrel.  His bleeding is less.  He relates having onset of bleeding and diarrhea problems prior to the bleeding after starting Metformin last year.  He says he is lost 40 pounds intentionally and the bleeding is less and the diarrhea is much less.  Stools are still not normal however.  From chart review it appears that his cardiologist Dr. Saunders Revel thinks it would be reasonable to temporarily hold his antiplatelet therapy 3 months after his PCI on 06/05/2019.  So we are in that window.  The patient is ready to have his colonoscopy but is thinking he would prefer to have it at the hospital with a better safety net.  His adult daughter indicates that he suffers from anxiety and medicates with marijuana for this.  The patient is asking for something for the anticipatory  anxiety of the colonoscopy.  Asking about long-term medical treatment of anxiety as well.  It sounds like buspirone is something that has been mentioned by someone but he has not specifically discussed his anxiety issues with his primary care provider. Allergies  Allergen Reactions  . Aspirin Other (See Comments)    Caused bloody stools  . Ibuprofen Other (See Comments)    Was told to not take this   . Naproxen Other (See Comments)    Was told to not take this   . Penicillins Hives    Did it involve swelling of the face/tongue/throat, SOB,  or low BP? Unk Did it involve sudden or severe rash/hives, skin peeling, or any reaction on the inside of your mouth or nose? Yes Did you need to seek medical attention at a hospital or doctor's office? Unk When did it last happen?"Childhood- 55 years ago" If all above answers are "NO", may proceed with cephalosporin use.   . Tylenol [Acetaminophen] Other (See Comments)    Was told to not take this   . Yellow Jacket Venom [Bee Venom] Swelling and Other (See Comments)    Severe swelling where stung   Current Meds  Medication Sig  . aspirin 81 MG chewable tablet Chew 1 tablet (81 mg total) by mouth daily.  . clopidogrel (PLAVIX) 75 MG tablet Take 1 tablet (75 mg total) by mouth daily.  Marland Kitchen ezetimibe (ZETIA) 10 MG tablet Take 10 mg by mouth at bedtime.  . fluticasone (FLONASE) 50 MCG/ACT nasal spray Place 1-2 sprays into both nostrils daily as needed for allergies or rhinitis (or seasonal allergies).  Marland Kitchen lisinopril (ZESTRIL) 2.5 MG tablet Take 2.5 mg by mouth daily.  . metoprolol tartrate (LOPRESSOR) 50 MG tablet Take 1.5 tablets (75 mg total) by mouth 2 (two) times daily.  . nitroGLYCERIN (NITROSTAT) 0.4 MG SL tablet Place 0.4 mg under the tongue every 5 (five) minutes as needed for chest pain.    Past Medical History:  Diagnosis Date  . Allergic rhinitis    [facility]  . Anxiety   . Atrial fibrillation ([place])    [facility]  . Benign essential hypertension    [facility]  . Coronary artery disease    [facility]  . Diabetic neuropathy, type II diabetes mellitus ([place])    [facility]  . Mixed hyperlipidemia    [facility]  . Myocardial infarct [facility])    [facility]  . Obesity, unspecified    [facility]  . Rectal bleeding    [facility]  . Wide-complex tachycardia ([place]) 06/04/2019   Past Surgical  History:  Procedure Laterality Date  . CORONARY ANGIOPLASTY     3 stents  . 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   Review of Systems As above  Objective:   Physical Exam BP 138/78   Pulse 64  Ht 5\' 10"  (1.778 m)   Wt 219 lb 2 oz (99.4 kg)   BMI 31.44 kg/m  No acute distress Lungs clear Normal heart sounds

## 2019-09-15 NOTE — Patient Instructions (Signed)
  Due to recent COVID-19 restrictions implemented by our local and state authorities and in an effort to keep both patients and staff as safe as possible, our hospital system now requires COVID-19 testing prior to any scheduled hospital procedure. Please go to our [facility] location drive thru testing site ([address] [place], [place] [address]) on 10/03/2019 at  11:15AM. There will be multiple testing areas, the first checkpoint being for pre-procedure/surgery testing. Get into the right (yellow) lane that leads to the PAT testing team. You will not be billed at the time of testing but may receive a bill later depending on your insurance. The approximate cost of the test is $100. You must agree to quarantine from the time of your testing until the procedure date on 10/07/2019 . This should include staying at home with ONLY the people you live with. Avoid take-out, grocery store shopping or leaving the house for any non-emergent reason. Failure to have your COVID-19 test done on the date and time you have been scheduled will result in cancellation of procedure. Please call our office at [phone] if you have any questions.    You have been scheduled for a colonoscopy. Please follow written instructions given to you at your visit today.  Please pick up your prep supplies at the pharmacy within the next 1-3 days. If you use inhalers (even only as needed), please bring them with you on the day of your procedure.   You will be contacted by our office prior to your procedure for directions on holding your Plavix.  If you do not hear from our office 1 week prior to your scheduled procedure, please call [phone] to discuss. Stay on your Asprin.   I appreciate the opportunity to care for you. Silvano Rusk, MD, [facility]

## 2019-09-15 NOTE — Telephone Encounter (Signed)
[facility] Pre-operative Risk Assessment     Request for surgical clearance:     Endoscopy Procedure  What type of surgery is being performed?     colonoscopy  When is this surgery scheduled?     10/07/2019  What type of clearance is required ?   Pharmacy  Are there any medications that need to be held prior to surgery and how long? Plavix, 5 days  Practice name and name of physician performing surgery?      [facility]  What is your office phone and fax number?      Phone- [phone]  [phone]  Anesthesia type (None, local, MAC, general) ?       MAC

## 2019-09-15 NOTE — Telephone Encounter (Signed)
Patient informed. 

## 2019-09-15 NOTE — Telephone Encounter (Signed)
-----   Message from Gatha Mayer, MD sent at 09/15/2019  4:31 PM EDT ----- Regarding: Clonazepam Rx Let him know that I did send in clonazepam to take for anxiety before colonoscopy  We discussed but I had forgotten

## 2019-09-15 NOTE — Progress Notes (Signed)
Jt Haefs 64 y.o. Jan 27, 1956 [id number]  Assessment & Plan:   Encounter Diagnoses  Name Primary?  . Rectal bleeding Yes  . Change in bowel habits   . Encounter for long-term use of antiplatelets/antithrombotics   . Anticipatory anxiety   . Non-ST elevation (NSTEMI) myocardial infarction ([place])   . Paroxysmal atrial fibrillation (HCC)     The best I can tell cardiology thinks it is reasonable to temporarily hold his antiplatelet therapy and proceed with a colonoscopy in this timeframe.  I do think it is reasonable to have him done at the hospital given his recent cardiac problems.  He does not need to hold his aspirin for the procedure and I did offer to perform the procedure on his clopidogrel which does confer some increased risk of bleeding but is typically manageable by using prophylactic clipping etc. if large polyps were removed.  He does not prefer to go this way.  He had been on apixaban in the past for A. fib but that is off given all of bleeding.  Certainly could consider resuming that depending upon what I see at colonoscopy.  I do think he is at some increased risk of having a cardiac event off his clopidogrel but he has had chronic bleeding issues and change in bowel habits some of which may have been medication related but we really do not know so it is appropriate to have a colonoscopy.  He understands and accepts the risk.  The risks and benefits as well as alternatives of endoscopic procedure(s) have been discussed and reviewed. All questions answered. The patient agrees to proceed.   I have prescribed number six 1 mg disintegrating clonazepam tablets for him to use to relieve anticipatory anxiety around the time of his colonoscopy.  No refills.  I have advised him and his daughter to discuss his chronic anxiety issues with primary care.     I appreciate the opportunity to care for this patient. CC: Hamrick, Lorin Mercy, MD Dr. Harrell Gave End Subjective:   Chief  Complaint: diarrhea, rectal bleeding  HPI 64 year old white man here with his daughter, with a history of hematochezia/rectal bleeding and diarrhea, was last seen here on June 04, 2019 by Nicoletta Ba PA-C, and he had presented with hypotension and irregular tachycardia.  He was sent by EMS to the hospital and was found to have V. tach with an N ST EMI, catheterization resulted in a successful PCI to the mid LAD using a drug-eluting stent.  The patient had had 3 stents placed at [facility] just a couple of months before that.  He had been on aspirin and Brilinta and it was switched to aspirin and clopidogrel.  His bleeding is less.  He relates having onset of bleeding and diarrhea problems prior to the bleeding after starting Metformin last year.  He says he is lost 40 pounds intentionally and the bleeding is less and the diarrhea is much less.  Stools are still not normal however.  From chart review it appears that his cardiologist Dr. Saunders Revel thinks it would be reasonable to temporarily hold his antiplatelet therapy 3 months after his PCI on 06/05/2019.  So we are in that window.  The patient is ready to have his colonoscopy but is thinking he would prefer to have it at the hospital with a better safety net.  His adult daughter indicates that he suffers from anxiety and medicates with marijuana for this.  The patient is asking for something for the anticipatory  anxiety of the colonoscopy.  Asking about long-term medical treatment of anxiety as well.  It sounds like buspirone is something that has been mentioned by someone but he has not specifically discussed his anxiety issues with his primary care provider. Allergies  Allergen Reactions  . Aspirin Other (See Comments)    Caused bloody stools  . Ibuprofen Other (See Comments)    Was told to not take this   . Naproxen Other (See Comments)    Was told to not take this   . Penicillins Hives    Did it involve swelling of the face/tongue/throat, SOB,  or low BP? Unk Did it involve sudden or severe rash/hives, skin peeling, or any reaction on the inside of your mouth or nose? Yes Did you need to seek medical attention at a hospital or doctor's office? Unk When did it last happen?"Childhood- 55 years ago" If all above answers are "NO", may proceed with cephalosporin use.   . Tylenol [Acetaminophen] Other (See Comments)    Was told to not take this   . Yellow Jacket Venom [Bee Venom] Swelling and Other (See Comments)    Severe swelling where stung   Current Meds  Medication Sig  . aspirin 81 MG chewable tablet Chew 1 tablet (81 mg total) by mouth daily.  . clopidogrel (PLAVIX) 75 MG tablet Take 1 tablet (75 mg total) by mouth daily.  Marland Kitchen ezetimibe (ZETIA) 10 MG tablet Take 10 mg by mouth at bedtime.  . fluticasone (FLONASE) 50 MCG/ACT nasal spray Place 1-2 sprays into both nostrils daily as needed for allergies or rhinitis (or seasonal allergies).  Marland Kitchen lisinopril (ZESTRIL) 2.5 MG tablet Take 2.5 mg by mouth daily.  . metoprolol tartrate (LOPRESSOR) 50 MG tablet Take 1.5 tablets (75 mg total) by mouth 2 (two) times daily.  . nitroGLYCERIN (NITROSTAT) 0.4 MG SL tablet Place 0.4 mg under the tongue every 5 (five) minutes as needed for chest pain.    Past Medical History:  Diagnosis Date  . Allergic rhinitis    [facility]  . Anxiety   . Atrial fibrillation ([place])    [facility]  . Benign essential hypertension    [facility]  . Coronary artery disease    [facility]  . Diabetic neuropathy, type II diabetes mellitus ([place])    [facility]  . Mixed hyperlipidemia    [facility]  . Myocardial infarct [facility]     [facility]  . Obesity, unspecified     [facility]  . Rectal bleeding     [facility]  . Wide-complex tachycardia ([place]) 06/04/2019   Past Surgical  History:  Procedure Laterality Date  . CORONARY ANGIOPLASTY     3 stents  . 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   Review of Systems As above  Objective:   Physical Exam BP 138/78   Pulse 64  Ht 5\' 10"  (1.778 m)   Wt 219 lb 2 oz (99.4 kg)   BMI 31.44 kg/m  No acute distress Lungs clear Normal heart sounds

## 2019-09-15 NOTE — Telephone Encounter (Signed)
   Primary Cardiologist: Nelva Bush, MD  Chart reviewed as part of pre-operative protocol coverage. History of coronary artery disease with inferior STEMI and primary PCI to the RCA in 123XX123 complicated by sustained ventricular tachycardia in the setting of residual left coronary artery disease status post subsequent PCI to the mid LAD (06/2019).   Last seen by Dr. Saunders Revel 08/28/19. Per note "[name] has not had any significant angina, though he does report some intermittent shortness of breath.  This could be due to a number of factors, most notably of which is recurrent atrial fibrillation.  Given likely need to transition back to anticoagulation once GI bleeding has been completely evaluated as well as concern for dyspnea related to ticagrelor, we have agreed to transition from ticagrelor to clopidogrel 75 mg daily.  We will plan to complete at least 3 months of aspirin and clopidogrel from the time of his most recent stent placement in February".   Dr. Saunders Revel, can you give your recommendations about clearance and Plavix? Please forward your response to P [facility] PREOP.   Thank you      Leanor Kail, PA 09/15/2019, 2:08 PM

## 2019-09-15 NOTE — Telephone Encounter (Signed)
Given ongoing dyspnea at our last visit, the patient needs reevaluation in the office before cardiac clearance can be completed.  He is currently scheduled to see me on 10/01/19.  Recommendations regarding clearance and antiplatelet therapy will be made at that time.  Nelva Bush, MD [facility]

## 2019-09-16 NOTE — Telephone Encounter (Signed)
Pt has appt with Dr. Saunders Revel 09/30/19 for clearance. I will send clearance notes to MD for upcoming appt. I will send FYI to the requesting office pt has upcoming appt with cardiologist. I will remove from the pre op call back pool.

## 2019-09-16 NOTE — Telephone Encounter (Signed)
   Primary Cardiologist:Christopher End, MD  Chart reviewed as part of pre-operative protocol coverage. Because of John Douglas past medical history and time since last visit, he/she will require a follow-up visit in order to better assess preoperative cardiovascular risk.  Pre-op covering staff: -Pt has already scheduled OV with Dr. Saunders Revel on 10/01/19. He will address surgical clearance at that time.   -Please contact requesting surgeon's office via preferred method (i.e, phone, fax) to inform them of need for appointment prior to surgery.  If applicable, this message will also be routed to pharmacy pool and/or primary cardiologist for input on holding anticoagulant/antiplatelet agent as requested below so that this information is available at time of patient's appointment.   Kathyrn Drown, NP  09/16/2019, 7:54 AM

## 2019-09-22 ENCOUNTER — Telehealth: Payer: Self-pay

## 2019-09-22 NOTE — Telephone Encounter (Signed)
-----   Message from Macy Lingenfelter E [place], [place] sent at 09/15/2019 12:53 PM EDT -----  Do we have plavix clearance for his colon to be done 10/07/2019?

## 2019-09-22 NOTE — Telephone Encounter (Signed)
I spoke with John Douglas and told him we are awaiting his visit with Dr End to get Plavix clearance.

## 2019-09-29 ENCOUNTER — Telehealth: Payer: Self-pay

## 2019-09-29 NOTE — Telephone Encounter (Signed)
-----   Message from Gatha Mayer, MD sent at 09/29/2019  1:01 PM EDT ----- Regarding: RE: help request Thanks, Luanna Salk   I will have PJ call the patient and explain that the doctor has a doctor's appointment and that the patient is in excellent hands.  PJ - as above - and then change it to Dr. Zola Button ----- Message ----- From: Lavena Bullion, DO Sent: 09/29/2019  12:11 PM EDT To: Gatha Mayer, MD Subject: RE: help request                               No problem. I'm happy to add on and just keep rolling with cases.  Thanks.  Vito ----- Message ----- From: Gatha Mayer, MD Sent: 09/29/2019  12:02 PM EDT To: Lavena Bullion, DO Subject: help request                                   Luanna Salk,  Another help request -  This guy is on for me at 145 6/9 - colonoscopy   I had to make an ortho appointment due to a hip problem (torn labrum) and need to go to [facility] to see a hip arthroscopy specialist  Would you be able to pick up this colonoscopy?  If so I will have staff call him and explain.  Thanks,  Glendell Docker

## 2019-09-29 NOTE — Telephone Encounter (Signed)
Left message for patient to call me back. 

## 2019-09-30 ENCOUNTER — Encounter: Payer: Self-pay | Admitting: Internal Medicine

## 2019-09-30 ENCOUNTER — Other Ambulatory Visit: Payer: Self-pay

## 2019-09-30 ENCOUNTER — Ambulatory Visit (INDEPENDENT_AMBULATORY_CARE_PROVIDER_SITE_OTHER): Payer: Medicare HMO | Admitting: Internal Medicine

## 2019-09-30 VITALS — BP 140/82 | HR 66 | Ht 70.0 in | Wt 219.0 lb

## 2019-09-30 DIAGNOSIS — I480 Paroxysmal atrial fibrillation: Secondary | ICD-10-CM | POA: Diagnosis not present

## 2019-09-30 DIAGNOSIS — I2510 Atherosclerotic heart disease of native coronary artery without angina pectoris: Secondary | ICD-10-CM | POA: Diagnosis not present

## 2019-09-30 DIAGNOSIS — I10 Essential (primary) hypertension: Secondary | ICD-10-CM

## 2019-09-30 DIAGNOSIS — I255 Ischemic cardiomyopathy: Secondary | ICD-10-CM

## 2019-09-30 DIAGNOSIS — Z01810 Encounter for preprocedural cardiovascular examination: Secondary | ICD-10-CM

## 2019-09-30 DIAGNOSIS — E785 Hyperlipidemia, unspecified: Secondary | ICD-10-CM

## 2019-09-30 MED ORDER — ROSUVASTATIN CALCIUM 20 MG PO TABS
20.0000 mg | ORAL_TABLET | Freq: Every day | ORAL | 2 refills | Status: DC
Start: 2019-09-30 — End: 2019-10-23

## 2019-09-30 NOTE — Telephone Encounter (Signed)
Patient got clearance from Dr End today at his visit. He told me he actually stopped it yesterday.

## 2019-09-30 NOTE — Progress Notes (Signed)
Follow-up Outpatient Visit Date: 09/30/2019  Primary Care Provider: Leonides Sake, MD [place] [address]  Chief Complaint: Abdominal discomfort  HPI:  John Douglas is a 64 y.o. male with history of coronary artery disease with inferior STEMI and primary PCI to the RCA in 123XX123 complicated by sustained ventricular tachycardia in the setting of residual left coronary artery disease status post subsequent PCI to the mid LAD (06/2019),hypertension, hyperlipidemia, atrial fibrillation, ischemic cardiomyopathy, type 2 diabetes mellitus, and recurrent GI bleeding, who presents for follow-up of coronary artery disease and cardiomyopathy.  I last saw John Douglas a month ago, at which time he was bothered primarily by ongoing abdominal pain and intermittent hematochezia.  He has since been evaluated by GI and is scheduled for colonoscopy with Dr. Carlean Purl in 1 week.  John Douglas also noted slight interval increase in his dyspnea and was concerned that ticagrelor could be contributing to this.  We therefore agreed to transition to clopidogrel.  We also agreed to increase metoprolol tartrate to 75 mg daily for improved heart rate control in the setting of recurrent atrial fibrillation.  Today, [name] reports that he is feeling much better from a heart standpoint.  His shortness of breath has significantly improved with transition from ticagrelor to clopidogrel.  He denies chest pain.  He felt like his heart was racing for a few hours after chewing tobacco for the first time in several months.  Otherwise, he denies palpitations as well as lightheadedness, and edema.  John Douglas is somewhat concerned about leg pain, especially when lying in bed at night.  His feet also feel cold from time to time.  He does not have leg pain with ambulation.  John Douglas is scheduled for colonoscopy for further assessment of his abdominal pain next week.  He has not had any further rectal bleeding but still  notices frequent discomfort.  He has been monitoring his blood pressure at home and reports that it is typically around 130/80.  --------------------------------------------------------------------------------------------------  Past Medical History:  Diagnosis Date  . Allergic rhinitis    [facility]  . Anxiety   . Atrial fibrillation ([place])    [facility]  . Benign essential hypertension    [facility]  . Coronary artery disease    [facility]  . Diabetic neuropathy, type II diabetes mellitus ([place])    [facility]  . Mixed hyperlipidemia    [facility]  . Myocardial infarct [facility])    [facility]  . Obesity, unspecified    [facility]  . Rectal bleeding     [facility]  . Wide-complex tachycardia ([place]) 06/04/2019   Past Surgical History:  Procedure Laterality Date  . CORONARY ANGIOPLASTY     3 stents  . LEFT HEART CATH AND CORONARY ANGIOGRAPHY N/A 06/05/2019   Procedure: LEFT HEART CATH AND CORONARY ANGIOGRAPHY;  Surgeon: Nelva Bush, MD;  Location: [facility];  Service: Cardiovascular;  Laterality: N/A;    Current Meds  Medication Sig  . aspirin 81 MG chewable tablet Chew 1 tablet (81 mg total) by mouth daily.  . clonazePAM (KLONOPIN) 1 MG disintegrating tablet Take 1 tablet (1 mg total) by mouth 2 (two) times daily as needed (pre-colonoscopy anxiety).  . clopidogrel (PLAVIX) 75 MG tablet Take 1 tablet (75 mg total) by mouth daily.  Marland Kitchen ezetimibe (ZETIA) 10 MG tablet Take 10  mg by mouth at bedtime.  . fluticasone (FLONASE) 50 MCG/ACT nasal spray Place 1-2 sprays into both nostrils daily as needed for allergies or rhinitis (or seasonal allergies).  Marland Kitchen lisinopril (ZESTRIL) 2.5 MG tablet Take 2.5 mg by mouth daily.  . metoprolol tartrate (LOPRESSOR) 50 MG tablet Take 1.5 tablets (75 mg total)  by mouth 2 (two) times daily.  . nitroGLYCERIN (NITROSTAT) 0.4 MG SL tablet Place 0.4 mg under the tongue every 5 (five) minutes as needed for chest pain.     Allergies: Aspirin, Ibuprofen, Naproxen, Penicillins, Tylenol [acetaminophen], and Yellow jacket venom [bee venom]  Social History   Tobacco Use  . Smoking status: Never Smoker  . Smokeless tobacco: Former Systems developer    Types: Chew  Substance Use Topics  . Alcohol use: Yes    Comment: occasional  . Drug use: Yes    Types: Marijuana    Family History  Problem Relation Age of Onset  . Diverticulitis Mother   . Hypertension Father   . Heart disease Father        MI  . Prostate cancer Father   . Colon cancer Neg Hx   . Stomach cancer Neg Hx   . Pancreatic cancer Neg Hx   . Rectal cancer Neg Hx     Review of Systems: A 12-system review of systems was performed and was negative except as noted in the HPI.  --------------------------------------------------------------------------------------------------  Physical Exam: BP 140/82 (BP Location: Left Arm, Patient Position: Sitting, Cuff Size: Normal)   Pulse 66   Ht 5\' 10"  (1.778 m)   Wt 219 lb (99.3 kg)   SpO2 98%   BMI 31.42 kg/m   General: NAD. Neck: No JVD or HJR. Lungs: Clear to auscultation without wheezes or crackles. Heart: Regular rate and rhythm without murmurs, rubs, or gallops. Abdomen: Soft, nontender, nondistended. Extremities: No lower extremity edema.  2+ pedal pulses.  EKG: Normal sinus rhythm with inferolateral T wave inversions and inferior Q waves.  Lab Results  Component Value Date   WBC 5.5 08/28/2019   HGB 12.6 (L) 08/28/2019   HCT 38.2 08/28/2019   MCV 83 08/28/2019   PLT 236 08/28/2019    Lab Results  Component Value Date   NA 140 08/28/2019   K 4.0 08/28/2019   CL 107 (H) 08/28/2019   CO2 20 08/28/2019   BUN 21 08/28/2019   CREATININE 0.88 08/28/2019   GLUCOSE 231 (H) 08/28/2019   ALT 22 08/28/2019    Lab Results  Component  Value Date   CHOL 78 (L) 08/28/2019   HDL 28 (L) 08/28/2019   LDLCALC 32 08/28/2019   TRIG 87 08/28/2019   CHOLHDL 2.8 08/28/2019    --------------------------------------------------------------------------------------------------  ASSESSMENT AND PLAN: Coronary artery disease: Mr. Zilinski is doing well without symptoms to suggest recurrent angina.  His shortness of breath has significantly improved with transitioning from ticagrelor to clopidogrel.  We will plan to complete 12 months of dual antiplatelet therapy with aspirin and clopidogrel, if tolerated, though I think it is reasonable to hold clopidogrel at this point for 1 week prior to upcoming colonoscopy.  We will defer other medication changes today other than restarting of rosuvastatin.  Ischemic cardiomyopathy: Ms. Nastri appears euvolemic.  We will continue current doses of lisinopril and metoprolol, though escalation of lisinopril in the future would be beneficial both from a heart failure and blood pressure standpoint.  Paroxysmal atrial fibrillation: Mr. Lindor has converted back to sinus rhythm with a reasonable heart rate today.  We will defer medication changes today.  As previously discussed, we will not add anticoagulation in the setting of ongoing DAPT as well as history of GI bleeding currently being worked up.  Hypertension: Blood pressure mildly elevated today (goal less than 140 30/80).  Home blood pressure readings are typically better.  We will defer medication changes at this time, though I would favor escalation of lisinopril in the future if blood pressure remains above goal.  Hyperlipidemia: No improvement in leg pain with statin holiday.  We will restart rosuvastatin at 20 mg daily.  Leg pain: No improvement with statin holiday.  Timing of the discomfort and palpable pedal pulses would argue against significant PAD.  We discussed obtaining ABIs but have agreed to defer this pending his aforementioned GI  evaluation.  Preoperative cardiovascular risk assessment: Mr. Trupp is scheduled for colonoscopy next week for further evaluation of GI bleeding and abdominal pain.  Mr. Tamashiro does not have any symptoms of active cardiac disease; he can proceed with colonoscopy as planned, which is a low risk procedure from a cardiovascular risk standpoint.  As he is 3 months out from his most recent PCI, I think it is reasonable to temporarily discontinue clopidogrel (5 to 7 days) leading up to colonoscopy and restarted as soon as it is felt safe to do so by his gastroenterologist.  Follow-up: Return to clinic in 3 months.  Nelva Bush, MD 09/30/2019 10:49 AM

## 2019-09-30 NOTE — Telephone Encounter (Signed)
I spoke with cardiology this AM as he has an appointment with Dr End at 10:40 so they will pass on a message to call me.

## 2019-09-30 NOTE — Telephone Encounter (Signed)
I spoke with Mr Jeffs and he said his appointment with Dr End went well today. Patient states he got clearance to hold his Plavix and that he actually stopped it yesterday. I told him the hospital will call him back, they like to go over the medicines directly with the patient.  I told him that Dr Bryan Lemma will be doing his procedure and he is fine with that. I called and spoke to [facility] in surgery scheduling and she changed the Dr.s name on his case. I have called Amy Hazelwood to let her know, left her a detailed message.

## 2019-09-30 NOTE — Patient Instructions (Signed)
Medication Instructions:  Your physician has recommended you make the following change in your medication:  1- START Crestor 20 mg  (1 tablet) by mouth once a day. (ok to start after colonoscopy).  2- Ok to hold Plavix from now until colonoscopy. Restart as soon as possible once your GI doctor says it is ok to do so.  *If you need a refill on your cardiac medications before your next appointment, please call your pharmacy*  Lab Work: NONE If you have labs (blood work) drawn today and your tests are completely normal, you will receive your results only by: Marland Kitchen MyChart Message (if you have MyChart) OR . A paper copy in the mail If you have any lab test that is abnormal or we need to change your treatment, we will call you to review the results.  Testing/Procedures: NONE  Follow-Up: At [facility], you and your health needs are our priority.  As part of our continuing mission to provide you with exceptional heart care, we have created designated Provider Care Teams.  These Care Teams include your primary Cardiologist (physician) and Advanced Practice Providers (APPs -  Physician Assistants and Nurse Practitioners) who all work together to provide you with the care you need, when you need it.  We recommend signing up for the patient portal called "MyChart".  Sign up information is provided on this After Visit Summary.  MyChart is used to connect with patients for Virtual Visits (Telemedicine).  Patients are able to view lab/test results, encounter notes, upcoming appointments, etc.  Non-urgent messages can be sent to your provider as well.   To learn more about what you can do with MyChart, go to [email or web address].    Your next appointment:   3 month(s)  The format for your next appointment:   In Person  Provider:    You may see Nelva Bush, MD or one of the following Advanced Practice Providers on your designated Care Team:    Murray Hodgkins, NP  Christell Faith,  PA-C  Marrianne Mood, PA-C

## 2019-09-30 NOTE — Telephone Encounter (Signed)
Returning your call. Tells me hospital called him to ask him for a medlist for his upcoming hospital procedure. Wonders if you can help him provide that? Can be reached at [phone].

## 2019-09-30 NOTE — Progress Notes (Deleted)
Follow-up Outpatient Visit Date: 09/30/2019  Primary Care Provider: Leonides Sake, MD [place] [address]  Chief Complaint: ***  HPI:  John Douglas is a 64 y.o. male with history of coronary artery disease with inferior STEMI and primary PCI to the RCA in 123XX123 complicated by sustained ventricular tachycardia in the setting of residual left coronary artery disease status post subsequent PCI to the mid LAD (06/2019),hypertension, hyperlipidemia, atrial fibrillation, ischemic cardiomyopathy, type 2 diabetes mellitus, and recurrent GI bleeding, who presents for follow-up of coronary artery disease and cardiomyopathy.  I last saw [name] a month ago, at which time he was bothered primarily by ongoing abdominal pain and intermittent hematochezia.  He has since been evaluated by GI and is scheduled for colonoscopy with Dr. Carlean Purl in 1 week.  [name] also noted slight interval increase in his dyspnea and was concerned that ticagrelor could be contributing to this.  We therefore agreed to transition to clopidogrel.  We also agreed to increase metoprolol tartrate to 75 mg daily for improved heart rate control in the setting of recurrent atrial fibrillation.  SOB much improved with plavix.  Pain in legs at night and stay cold.  No pain with ambulation.  Stopping crestor no improvement in pain.  Didn't take Plavix this AM.  Home BP usually 130/80.  --------------------------------------------------------------------------------------------------  Past Medical History:  Diagnosis Date  . Allergic rhinitis    [facility]  . Anxiety   . Atrial fibrillation ([place])    [facility]  . Benign essential hypertension    [facility]  . Coronary artery disease    [facility]  . Diabetic neuropathy, type II diabetes mellitus ([place])    [facility]  . Mixed hyperlipidemia    [place]  [facility]  . Myocardial infarct [facility])    [facility]  . Obesity, unspecified    [facility]  . Rectal bleeding    [facility]  . Wide-complex tachycardia ([place]) 06/04/2019   Past Surgical History:  Procedure Laterality Date  . CORONARY ANGIOPLASTY     3 stents  . LEFT HEART CATH AND CORONARY ANGIOGRAPHY N/A 06/05/2019   Procedure: LEFT HEART CATH AND CORONARY ANGIOGRAPHY;  Surgeon: Nelva Bush, MD;  Location: [facility];  Service: Cardiovascular;  Laterality: N/A;    Current Meds  Medication Sig  . aspirin 81 MG chewable tablet Chew 1 tablet (81 mg total) by mouth daily.  . clonazePAM (KLONOPIN) 1 MG disintegrating tablet Take 1 tablet (1 mg total) by mouth 2 (two) times daily as needed (pre-colonoscopy anxiety).  . clopidogrel (PLAVIX) 75 MG tablet Take 1 tablet (75 mg total) by mouth daily.  Marland Kitchen ezetimibe (ZETIA) 10 MG tablet Take 10 mg by mouth at bedtime.  . fluticasone (FLONASE) 50 MCG/ACT nasal spray Place 1-2 sprays into both nostrils daily as needed for allergies or rhinitis (or seasonal allergies).  Marland Kitchen lisinopril (ZESTRIL) 2.5 MG tablet Take 2.5 mg by mouth daily.  . metoprolol tartrate (LOPRESSOR) 50 MG tablet Take 1.5 tablets (75 mg total) by mouth 2 (two) times daily.  . nitroGLYCERIN (NITROSTAT) 0.4 MG SL tablet Place 0.4 mg under the tongue every 5 (five) minutes as needed for chest pain.     Allergies: Aspirin, Ibuprofen, Naproxen, Penicillins, Tylenol [acetaminophen], and Yellow jacket venom [bee venom]  Social History   Tobacco Use  . Smoking status:  Never Smoker  . Smokeless tobacco: Former Systems developer    Types: Chew  Substance Use Topics  . Alcohol use: Yes    Comment: occasional  . Drug use: Yes    Types: Marijuana    Family History  Problem Relation Age of Onset  . Diverticulitis Mother   . Hypertension Father   . Heart disease Father        MI  . Prostate cancer Father   .  Colon cancer Neg Hx   . Stomach cancer Neg Hx   . Pancreatic cancer Neg Hx   . Rectal cancer Neg Hx     Review of Systems: A 12-system review of systems was performed and was negative except as noted in the HPI.  --------------------------------------------------------------------------------------------------  Physical Exam: BP 140/82 (BP Location: Left Arm, Patient Position: Sitting, Cuff Size: Normal)   Pulse 66   Ht 5\' 10"  (1.778 m)   Wt 219 lb (99.3 kg)   SpO2 98%   BMI 31.42 kg/m   General:  *** HEENT: No conjunctival pallor or scleral icterus. Facemask in place. Neck: Supple without lymphadenopathy, thyromegaly, JVD, or HJR. Lungs: Normal work of breathing. Clear to auscultation bilaterally without wheezes or crackles. Heart: Regular rate and rhythm without murmurs, rubs, or gallops. Non-displaced PMI. Abd: Bowel sounds present. Soft, NT/ND without hepatosplenomegaly Ext: No lower extremity edema. Radial, PT, and DP pulses are 2+ bilaterally. Skin: Warm and dry without rash.  EKG:  ***  Lab Results  Component Value Date   WBC 5.5 08/28/2019   HGB 12.6 (L) 08/28/2019   HCT 38.2 08/28/2019   MCV 83 08/28/2019   PLT 236 08/28/2019    Lab Results  Component Value Date   NA 140 08/28/2019   K 4.0 08/28/2019   CL 107 (H) 08/28/2019   CO2 20 08/28/2019   BUN 21 08/28/2019   CREATININE 0.88 08/28/2019   GLUCOSE 231 (H) 08/28/2019   ALT 22 08/28/2019    Lab Results  Component Value Date   CHOL 78 (L) 08/28/2019   HDL 28 (L) 08/28/2019   LDLCALC 32 08/28/2019   TRIG 87 08/28/2019   CHOLHDL 2.8 08/28/2019    --------------------------------------------------------------------------------------------------  ASSESSMENT AND PLAN: Harrell Gave Kili Gracy, MD 09/30/2019 10:49 AM

## 2019-10-03 ENCOUNTER — Other Ambulatory Visit (HOSPITAL_COMMUNITY)
Admission: RE | Admit: 2019-10-03 | Discharge: 2019-10-03 | Disposition: A | Discharge status: Home / Self Care | Payer: Medicare HMO | Source: Ambulatory Visit | Attending: Gastroenterology | Admitting: Gastroenterology

## 2019-10-03 DIAGNOSIS — Z01812 Encounter for preprocedural laboratory examination: Secondary | ICD-10-CM | POA: Diagnosis present

## 2019-10-03 DIAGNOSIS — Z20822 Contact with and (suspected) exposure to covid-19: Secondary | ICD-10-CM | POA: Insufficient documentation

## 2019-10-03 LAB — SARS CORONAVIRUS 2 (TAT 6-24 HRS): SARS Coronavirus 2: NEGATIVE

## 2019-10-06 NOTE — Telephone Encounter (Signed)
   Primary Cardiologist: Nelva Bush, MD  Chart reviewed as part of pre-operative protocol coverage. Patient was recently seen by Dr. Saunders Revel on 09/30/2019 at which time pre-op risk assessment was addressed.  Per Dr. Darnelle Bos note: "John Douglas is scheduled for colonoscopy next week for further evaluation of GI bleeding and abdominal pain.  John Douglas does not have any symptoms of active cardiac disease; he can proceed with colonoscopy as planned, which is a low risk procedure from a cardiovascular risk standpoint.  As he is 3 months out from his most recent PCI, I think it is reasonable to temporarily discontinue clopidogrel (5 to 7 days) leading up to colonoscopy and restarted as soon as it is felt safe to do so by his gastroenterologist."  I will route this recommendation to the requesting party via [place] fax function and remove from pre-op pool.  Please call with questions.  Darreld Mclean, PA-C 10/06/2019, 12:15 PM

## 2019-10-07 ENCOUNTER — Encounter (HOSPITAL_COMMUNITY): Payer: Self-pay | Admitting: Gastroenterology

## 2019-10-07 ENCOUNTER — Ambulatory Visit (HOSPITAL_BASED_OUTPATIENT_CLINIC_OR_DEPARTMENT_OTHER)
Admission: RE | Admit: 2019-10-07 | Discharge: 2019-10-07 | Disposition: A | Discharge status: Home / Self Care | Payer: Medicare HMO | Source: Home / Self Care | Attending: Gastroenterology | Admitting: Gastroenterology

## 2019-10-07 ENCOUNTER — Encounter (HOSPITAL_COMMUNITY): Payer: Self-pay | Admitting: Emergency Medicine

## 2019-10-07 ENCOUNTER — Ambulatory Visit (HOSPITAL_COMMUNITY): Payer: Medicare HMO | Admitting: Certified Registered Nurse Anesthetist

## 2019-10-07 ENCOUNTER — Encounter (HOSPITAL_COMMUNITY)
Admission: EM | Disposition: A | Discharge status: Home / Self Care | Payer: Self-pay | Source: Home / Self Care | Attending: Emergency Medicine

## 2019-10-07 ENCOUNTER — Telehealth: Payer: Self-pay | Admitting: Internal Medicine

## 2019-10-07 ENCOUNTER — Ambulatory Visit (HOSPITAL_COMMUNITY): Admit: 2019-10-07 | Payer: Medicare HMO | Admitting: Gastroenterology

## 2019-10-07 ENCOUNTER — Other Ambulatory Visit: Payer: Self-pay

## 2019-10-07 ENCOUNTER — Emergency Department (HOSPITAL_COMMUNITY)
Admission: EM | Admit: 2019-10-07 | Discharge: 2019-10-07 | Disposition: A | Discharge status: Home / Self Care | Payer: Medicare HMO | Source: Home / Self Care | Attending: Emergency Medicine | Admitting: Emergency Medicine

## 2019-10-07 ENCOUNTER — Encounter (HOSPITAL_COMMUNITY): Payer: Self-pay | Admitting: Anesthesiology

## 2019-10-07 ENCOUNTER — Encounter (HOSPITAL_COMMUNITY)
Admission: RE | Disposition: A | Discharge status: Home / Self Care | Payer: Self-pay | Source: Home / Self Care | Attending: Gastroenterology

## 2019-10-07 DIAGNOSIS — R197 Diarrhea, unspecified: Secondary | ICD-10-CM | POA: Insufficient documentation

## 2019-10-07 DIAGNOSIS — K621 Rectal polyp: Secondary | ICD-10-CM | POA: Insufficient documentation

## 2019-10-07 DIAGNOSIS — Z7982 Long term (current) use of aspirin: Secondary | ICD-10-CM | POA: Insufficient documentation

## 2019-10-07 DIAGNOSIS — K635 Polyp of colon: Secondary | ICD-10-CM | POA: Diagnosis not present

## 2019-10-07 DIAGNOSIS — R42 Dizziness and giddiness: Secondary | ICD-10-CM

## 2019-10-07 DIAGNOSIS — I255 Ischemic cardiomyopathy: Secondary | ICD-10-CM | POA: Insufficient documentation

## 2019-10-07 DIAGNOSIS — C20 Malignant neoplasm of rectum: Secondary | ICD-10-CM

## 2019-10-07 DIAGNOSIS — I10 Essential (primary) hypertension: Secondary | ICD-10-CM | POA: Insufficient documentation

## 2019-10-07 DIAGNOSIS — K921 Melena: Secondary | ICD-10-CM | POA: Insufficient documentation

## 2019-10-07 DIAGNOSIS — K5730 Diverticulosis of large intestine without perforation or abscess without bleeding: Secondary | ICD-10-CM | POA: Insufficient documentation

## 2019-10-07 DIAGNOSIS — F419 Anxiety disorder, unspecified: Secondary | ICD-10-CM | POA: Insufficient documentation

## 2019-10-07 DIAGNOSIS — Z6831 Body mass index (BMI) 31.0-31.9, adult: Secondary | ICD-10-CM | POA: Insufficient documentation

## 2019-10-07 DIAGNOSIS — I2510 Atherosclerotic heart disease of native coronary artery without angina pectoris: Secondary | ICD-10-CM | POA: Insufficient documentation

## 2019-10-07 DIAGNOSIS — Z79899 Other long term (current) drug therapy: Secondary | ICD-10-CM | POA: Insufficient documentation

## 2019-10-07 DIAGNOSIS — K625 Hemorrhage of anus and rectum: Secondary | ICD-10-CM

## 2019-10-07 DIAGNOSIS — E119 Type 2 diabetes mellitus without complications: Secondary | ICD-10-CM | POA: Insufficient documentation

## 2019-10-07 DIAGNOSIS — I480 Paroxysmal atrial fibrillation: Secondary | ICD-10-CM | POA: Insufficient documentation

## 2019-10-07 DIAGNOSIS — Z7902 Long term (current) use of antithrombotics/antiplatelets: Secondary | ICD-10-CM | POA: Insufficient documentation

## 2019-10-07 DIAGNOSIS — I252 Old myocardial infarction: Secondary | ICD-10-CM | POA: Insufficient documentation

## 2019-10-07 DIAGNOSIS — K644 Residual hemorrhoidal skin tags: Secondary | ICD-10-CM | POA: Insufficient documentation

## 2019-10-07 DIAGNOSIS — R194 Change in bowel habit: Secondary | ICD-10-CM

## 2019-10-07 DIAGNOSIS — I63431 Cerebral infarction due to embolism of right posterior cerebral artery: Secondary | ICD-10-CM | POA: Diagnosis not present

## 2019-10-07 HISTORY — PX: POLYPECTOMY: SHX5525

## 2019-10-07 HISTORY — PX: SUBMUCOSAL TATTOO INJECTION: SHX6856

## 2019-10-07 HISTORY — PX: COLONOSCOPY WITH PROPOFOL: SHX5780

## 2019-10-07 HISTORY — PX: BIOPSY: SHX5522

## 2019-10-07 LAB — CBC WITH DIFFERENTIAL/PLATELET
Abs Immature Granulocytes: 0.05 10*3/uL (ref 0.00–0.07)
Basophils Absolute: 0 10*3/uL (ref 0.0–0.1)
Basophils Relative: 0 %
Eosinophils Absolute: 0.1 10*3/uL (ref 0.0–0.5)
Eosinophils Relative: 1 %
HCT: 37.9 % — ABNORMAL LOW (ref 39.0–52.0)
Hemoglobin: 12.5 g/dL — ABNORMAL LOW (ref 13.0–17.0)
Immature Granulocytes: 1 %
Lymphocytes Relative: 18 %
Lymphs Abs: 1.6 10*3/uL (ref 0.7–4.0)
MCH: 27.3 pg (ref 26.0–34.0)
MCHC: 33 g/dL (ref 30.0–36.0)
MCV: 82.8 fL (ref 80.0–100.0)
Monocytes Absolute: 0.5 10*3/uL (ref 0.1–1.0)
Monocytes Relative: 6 %
Neutro Abs: 6.3 10*3/uL (ref 1.7–7.7)
Neutrophils Relative %: 74 %
Platelets: 252 10*3/uL (ref 150–400)
RBC: 4.58 MIL/uL (ref 4.22–5.81)
RDW: 15.6 % — ABNORMAL HIGH (ref 11.5–15.5)
WBC: 8.5 10*3/uL (ref 4.0–10.5)
nRBC: 0 % (ref 0.0–0.2)

## 2019-10-07 LAB — COMPREHENSIVE METABOLIC PANEL
ALT: 16 U/L (ref 0–44)
AST: 16 U/L (ref 15–41)
Albumin: 3.9 g/dL (ref 3.5–5.0)
Alkaline Phosphatase: 56 U/L (ref 38–126)
Anion gap: 10 (ref 5–15)
BUN: 16 mg/dL (ref 8–23)
CO2: 26 mmol/L (ref 22–32)
Calcium: 8.9 mg/dL (ref 8.9–10.3)
Chloride: 105 mmol/L (ref 98–111)
Creatinine, Ser: 0.87 mg/dL (ref 0.61–1.24)
GFR calc Af Amer: >60 mL/min (ref >60)
GFR calc non Af Amer: >60 mL/min (ref >60)
Glucose, Bld: 167 mg/dL — ABNORMAL HIGH (ref 70–99)
Potassium: 4 mmol/L (ref 3.5–5.1)
Sodium: 141 mmol/L (ref 135–145)
Total Bilirubin: 0.8 mg/dL (ref 0.3–1.2)
Total Protein: 6.7 g/dL (ref 6.5–8.1)

## 2019-10-07 SURGERY — CANCELLED PROCEDURE
Anesthesia: Monitor Anesthesia Care

## 2019-10-07 SURGERY — COLONOSCOPY WITH PROPOFOL
Anesthesia: Monitor Anesthesia Care

## 2019-10-07 MED ORDER — LISINOPRIL 2.5 MG PO TABS
2.5000 mg | ORAL_TABLET | Freq: Once | ORAL | Status: AC
  Administered 2019-10-07: 2.5 mg via ORAL
  Filled 2019-10-07: qty 1

## 2019-10-07 MED ORDER — PROPOFOL 500 MG/50ML IV EMUL
INTRAVENOUS | Status: DC | PRN
  Administered 2019-10-07: 100 ug/kg/min via INTRAVENOUS

## 2019-10-07 MED ORDER — METOPROLOL TARTRATE 5 MG/5ML IV SOLN
5.0000 mg | Freq: Once | INTRAVENOUS | Status: AC
  Administered 2019-10-07: 5 mg via INTRAVENOUS
  Filled 2019-10-07: qty 5

## 2019-10-07 MED ORDER — SODIUM CHLORIDE 0.9 % IV BOLUS (SEPSIS)
1000.0000 mL | Freq: Once | INTRAVENOUS | Status: AC
  Administered 2019-10-07: 1000 mL via INTRAVENOUS

## 2019-10-07 MED ORDER — SPOT INK MARKER SYRINGE KIT
PACK | SUBMUCOSAL | Status: AC
  Filled 2019-10-07: qty 5

## 2019-10-07 MED ORDER — SODIUM CHLORIDE 0.9 % IV SOLN
INTRAVENOUS | Status: DC
  Administered 2019-10-07: 1000 mL via INTRAVENOUS

## 2019-10-07 MED ORDER — LIDOCAINE 2% (20 MG/ML) 5 ML SYRINGE
INTRAMUSCULAR | Status: DC | PRN
  Administered 2019-10-07: 80 mg via INTRAVENOUS

## 2019-10-07 MED ORDER — PROPOFOL 10 MG/ML IV BOLUS
INTRAVENOUS | Status: DC | PRN
  Administered 2019-10-07: 20 mg via INTRAVENOUS

## 2019-10-07 MED ORDER — LACTATED RINGERS IV SOLN: INTRAVENOUS | Status: DC

## 2019-10-07 MED ORDER — LACTATED RINGERS IV SOLN: INTRAVENOUS | Status: DC | PRN

## 2019-10-07 MED ORDER — SPOT INK MARKER SYRINGE KIT
PACK | SUBMUCOSAL | Status: DC | PRN
  Administered 2019-10-07: 5 mL via SUBMUCOSAL

## 2019-10-07 SURGICAL SUPPLY — 21 items

## 2019-10-07 NOTE — Discharge Instructions (Signed)
YOU HAD AN ENDOSCOPIC PROCEDURE TODAY: Refer to the procedure report and other information in the discharge instructions given to you for any specific questions about what was found during the examination. If this information does not answer your questions, please call [facility] office at [phone] to clarify.  ° °YOU SHOULD EXPECT: Some feelings of bloating in the abdomen. Passage of more gas than usual. Walking can help get rid of the air that was put into your GI tract during the procedure and reduce the bloating. If you had a lower endoscopy (such as a colonoscopy or flexible sigmoidoscopy) you may notice spotting of blood in your stool or on the toilet paper. Some abdominal soreness may be present for a day or two, also. ° °DIET: Your first meal following the procedure should be a light meal and then it is ok to progress to your normal diet. A half-sandwich or bowl of soup is an example of a good first meal. Heavy or fried foods are harder to digest and may make you feel nauseous or bloated. Drink plenty of fluids but you should avoid alcoholic beverages for 24 hours. If you had a esophageal dilation, please see attached instructions for diet.   ° °ACTIVITY: Your care partner should take you home directly after the procedure. You should plan to take it easy, moving slowly for the rest of the day. You can resume normal activity the day after the procedure however YOU SHOULD NOT DRIVE, use power tools, machinery or perform tasks that involve climbing or major physical exertion for 24 hours (because of the sedation medicines used during the test).  ° °SYMPTOMS TO REPORT IMMEDIATELY: °A gastroenterologist can be reached at any hour. Please call [phone]  for any of the following symptoms:  °Following lower endoscopy (colonoscopy, flexible sigmoidoscopy) °Excessive amounts of blood in the stool  °Significant tenderness, worsening of abdominal pains  °Swelling of the abdomen that is new, acute  °Fever of 100° or  higher  °Following upper endoscopy (EGD, EUS, ERCP, esophageal dilation) °Vomiting of blood or coffee ground material  °New, significant abdominal pain  °New, significant chest pain or pain under the shoulder blades  °Painful or persistently difficult swallowing  °New shortness of breath  °Black, tarry-looking or red, bloody stools ° °FOLLOW UP:  °If any biopsies were taken you will be contacted by phone or by letter within the next 1-3 weeks. Call [phone]  if you have not heard about the biopsies in 3 weeks.  °Please also call with any specific questions about appointments or follow up tests. ° °

## 2019-10-07 NOTE — Transfer of Care (Signed)
Immediate Anesthesia Transfer of Care Note  Patient: John Douglas  Procedure(s) Performed: COLONOSCOPY WITH PROPOFOL (N/A ) POLYPECTOMY  Patient Location: Endoscopy Unit  Anesthesia Type:MAC  Level of Consciousness: drowsy and responds to stimulation  Airway & Oxygen Therapy: Patient Spontanous Breathing and Patient connected to face mask oxygen  Post-op Assessment: Report given to RN, Post -op Vital signs reviewed and stable and Patient moving all extremities  Post vital signs: Reviewed and stable  Last Vitals:  Vitals Value Taken Time  BP 135/76 10/07/19 1401  Temp    Pulse 57 10/07/19 1403  Resp 0 10/07/19 1403  SpO2 99 % 10/07/19 1403  Vitals shown include unvalidated device data.  Last Pain:  Vitals:   10/07/19 1231  TempSrc: Oral  PainSc: 4       Patients Stated Pain Goal: 2 (05/69/79 4801)  Complications: No apparent anesthesia complications

## 2019-10-07 NOTE — Anesthesia Preprocedure Evaluation (Deleted)
Anesthesia Evaluation  Patient identified by MRN, date of birth, ID band Patient awake    Reviewed: Allergy & Precautions, H&P , NPO status , Patient's Chart, lab work & pertinent test results, reviewed documented beta blocker date and time   Airway Mallampati: II  TM Distance: >3 FB Neck ROM: full    Dental no notable dental hx.    Pulmonary neg pulmonary ROS,    Pulmonary exam normal breath sounds clear to auscultation       Cardiovascular Exercise Tolerance: Good hypertension, Pt. on medications and Pt. on home beta blockers + CAD, + Past MI and + Cardiac Stents  + dysrhythmias Atrial Fibrillation and Ventricular Tachycardia  Rhythm:regular Rate:Normal  ECHO 21' EF 45-50%  Cath 21' Significant single-vessel coronary artery disease with diffuse mid LAD disease of up to 80-90%, extending into the ostium of D1. Mild to moderate, non-obstructive coronary artery disease involving the proximal LAD, LCx and RCA. Widely patent overlapping stents in the RCA extending from the ostium through the distal vessel. Successful PCI to the mid LAD using Synergy 2.5 x 38 mm drug-eluting stent (post-dilated to 2.9 mm) with 0% residual stenosis and TIMI-3 flow.  Jailed D2 branch exhibits 90% ostial stenosis but TIMI-3 flow.  This branch was not intervened upon.   Neuro/Psych negative neurological ROS  negative psych ROS   GI/Hepatic negative GI ROS, Neg liver ROS,   Endo/Other  diabetes, Type 2Morbid obesity  Renal/GU negative Renal ROS  negative genitourinary   Musculoskeletal   Abdominal   Peds  Hematology negative hematology ROS (+)   Anesthesia Other Findings   Reproductive/Obstetrics negative OB ROS                            Anesthesia Physical Anesthesia Plan  ASA: III  Anesthesia Plan: MAC   Post-op Pain Management:    Induction: Intravenous  PONV Risk Score and Plan:   Airway Management  Planned: Mask and Natural Airway  Additional Equipment:   Intra-op Plan:   Post-operative Plan:   Informed Consent: I have reviewed the patients History and Physical, chart, labs and discussed the procedure including the risks, benefits and alternatives for the proposed anesthesia with the patient or authorized representative who has indicated his/her understanding and acceptance.     Dental Advisory Given  Plan Discussed with: CRNA and Anesthesiologist  Anesthesia Plan Comments:        Anesthesia Quick Evaluation

## 2019-10-07 NOTE — ED Triage Notes (Signed)
Patient has been prepping for colonoscopy since Monday night. He was dizzy last night and called EMS, they came out and he was orthostatic negative. Patient endorses HA and dizziness upon standing today. Scheduled for colonoscopy at 1 today with [facility]. Stroke negative per EMS.

## 2019-10-07 NOTE — ED Provider Notes (Signed)
[facility] DEPT Provider Note   CSN: [id number] Arrival date & time: 10/07/19  1007     History Chief Complaint  Patient presents with  . Dizziness    John Douglas is a 64 y.o. male.  Patient complains of dizziness patient took medicines to get a colonoscopy today.  Patient did not take all of his blood pressure medicine.  He is feeling better now.  The history is provided by the patient. No language interpreter was used.  Dizziness Quality:  Lightheadedness Severity:  Mild Onset quality:  Sudden Timing:  Intermittent Progression:  Improving Chronicity:  New Context: not when bending over   Relieved by:  Nothing Worsened by:  Nothing Ineffective treatments:  None tried Associated symptoms: no chest pain, no diarrhea and no headaches        Past Medical History:  Diagnosis Date  . Allergic rhinitis    [facility]  . Anxiety   . Atrial fibrillation ([place])    [facility]  . Benign essential hypertension    [facility]  . Coronary artery disease    [facility]  . Diabetic neuropathy, type II diabetes mellitus ([place])    [facility]  . Mixed hyperlipidemia    [facility]  . Myocardial infarct [facility])    [facility]  . Obesity, unspecified    [facility]  . Rectal bleeding    [facility]  . Wide-complex tachycardia ([place]) 06/04/2019    Patient Active Problem List   Diagnosis Date Noted  . Gastrointestinal hemorrhage 08/29/2019  . Coronary artery disease involving native coronary artery of native heart without angina pectoris 08/29/2019  . Ischemic cardiomyopathy 08/29/2019  . Paroxysmal atrial fibrillation (H[facility])   . Essential hypertension   . Hyperlipidemia LDL goal <70   . Non-ST elevation (NSTEMI) myocardial infarction ([place])   . Ventricular tachycardia  ([place]) 06/04/2019    Past Surgical History:  Procedure Laterality Date  . CORONARY ANGIOPLASTY     3 stents  . LEFT HEART CATH AND CORONARY ANGIOGRAPHY N/A 06/05/2019   Procedure: LEFT HEART CATH AND CORONARY ANGIOGRAPHY;  Surgeon: Nelva Bush, MD;  Location: [facility];  Service: Cardiovascular;  Laterality: N/A;       Family History  Problem Relation Age of Onset  . Diverticulitis Mother   . Hypertension Father   . Heart disease Father        MI  . Prostate cancer Father   . Colon cancer Neg Hx   . Stomach cancer Neg Hx   . Pancreatic cancer Neg Hx   . Rectal cancer Neg Hx     Social History   Tobacco Use  . Smoking status: Never Smoker  . Smokeless tobacco: Former Systems developer    Types: Chew  Substance Use Topics  . Alcohol use: Yes    Comment: occasional  . Drug use: Yes    Types: Marijuana    Home Medications Prior to Admission medications   Medication Sig Start Date End Date Taking? Authorizing Provider  aspirin 81 MG chewable tablet Chew 1 tablet (81 mg total) by mouth daily. 06/07/19  Yes Kathyrn Drown D, NP  clonazePAM (KLONOPIN) 1 MG disintegrating tablet Take 1 tablet (1 mg total) by mouth 2 (two) times daily as needed (pre-colonoscopy anxiety). 09/15/19  Yes Gatha Mayer, MD  clopidogrel (PLAVIX) 75 MG tablet Take 1 tablet (75 mg  total) by mouth daily. 08/28/19  Yes End, Harrell Gave, MD  ezetimibe (ZETIA) 10 MG tablet Take 10 mg by mouth at bedtime. 05/25/19  Yes [provider]  fluticasone (FLONASE) 50 MCG/ACT nasal spray Place 1-2 sprays into both nostrils daily as needed for allergies or rhinitis (or seasonal allergies).   Yes [provider]  lisinopril (ZESTRIL) 2.5 MG tablet Take 2.5 mg by mouth daily. 05/25/19  Yes [provider]  metoprolol tartrate (LOPRESSOR) 50 MG tablet Take 1.5 tablets (75 mg total) by mouth 2 (two) times daily. 08/28/19  Yes End, Harrell Gave, MD  nitroGLYCERIN (NITROSTAT) 0.4 MG SL tablet Place 0.4  mg under the tongue every 5 (five) minutes as needed for chest pain.  05/25/19 05/24/20 Yes [provider]  rosuvastatin (CRESTOR) 20 MG tablet Take 1 tablet (20 mg total) by mouth daily. 09/30/19 12/29/19  End, Harrell Gave, MD    Allergies    Ibuprofen, Metformin and related, Naproxen, Penicillins, and Yellow jacket venom [bee venom]  Review of Systems   Review of Systems  Constitutional: Negative for appetite change and fatigue.  HENT: Negative for congestion, ear discharge and sinus pressure.   Eyes: Negative for discharge.  Respiratory: Negative for cough.   Cardiovascular: Negative for chest pain.  Gastrointestinal: Negative for abdominal pain and diarrhea.  Genitourinary: Negative for frequency and hematuria.  Musculoskeletal: Negative for back pain.  Skin: Negative for rash.  Neurological: Positive for dizziness. Negative for seizures and headaches.  Psychiatric/Behavioral: Negative for hallucinations.    Physical Exam Updated Vital Signs BP (!) 158/76 (BP Location: Right Arm)   Pulse 60   Temp 98.4 F (36.9 C) (Oral)   Resp 18   Ht 5\' 10"  (1.778 m)   Wt 95.3 kg   SpO2 97%   BMI 30.13 kg/m   Physical Exam Vitals and nursing note reviewed.  Constitutional:      Appearance: He is well-developed.  HENT:     Head: Normocephalic.     Nose: Nose normal.  Eyes:     General: No scleral icterus.    Conjunctiva/sclera: Conjunctivae normal.  Neck:     Thyroid: No thyromegaly.  Cardiovascular:     Rate and Rhythm: Normal rate and regular rhythm.     Heart sounds: No murmur. No friction rub. No gallop.   Pulmonary:     Breath sounds: No stridor. No wheezing or rales.  Chest:     Chest wall: No tenderness.  Abdominal:     General: There is no distension.     Tenderness: There is no abdominal tenderness. There is no rebound.  Musculoskeletal:        General: Normal range of motion.     Cervical back: Neck supple.  Lymphadenopathy:     Cervical: No cervical  adenopathy.  Skin:    Findings: No erythema or rash.  Neurological:     Mental Status: He is alert and oriented to person, place, and time.     Motor: No abnormal muscle tone.     Coordination: Coordination normal.  Psychiatric:        Behavior: Behavior normal.     ED Results / Procedures / Treatments   Labs (all labs ordered are listed, but only abnormal results are displayed) Labs Reviewed  CBC WITH DIFFERENTIAL/PLATELET - Abnormal; Notable for the following components:      Result Value   Hemoglobin 12.5 (*)    HCT 37.9 (*)    RDW 15.6 (*)    All  other components within normal limits  COMPREHENSIVE METABOLIC PANEL - Abnormal; Notable for the following components:   Glucose, Bld 167 (*)    All other components within normal limits    EKG None  Radiology No results found.  Procedures Procedures (including critical care time)  Medications Ordered in ED Medications  sodium chloride 0.9 % bolus 1,000 mL (1,000 mLs Intravenous New Bag/Given 10/07/19 1041)  metoprolol tartrate (LOPRESSOR) injection 5 mg (5 mg Intravenous Given 10/07/19 1119)  lisinopril (ZESTRIL) tablet 2.5 mg (2.5 mg Oral Given 10/07/19 1120)    ED Course  I have reviewed the triage vital signs and the nursing notes.  Pertinent labs & imaging results that were available during my care of the patient were reviewed by me and considered in my medical decision making (see chart for details).    MDM Rules/Calculators/A&P                      Patient with dizziness.  Most likely secondary to not taking his blood pressure medicine and doing the prep for colonoscopy.  Labs unremarkable blood pressure better patient will be discharged home and can get his colonoscopy today      This patient presents to the ED for concern of dizziness, this involves an extensive number of treatment options, and is a complaint that carries with it a high risk of complications and morbidity.  The differential diagnosis includes  stroke blood pressure   Lab Tests:   I Ordered, reviewed, and interpreted labs, which included CBC and chemistries that were unremarkable except mild anemia  Medicines ordered:   I ordered medication Lopressor for blood pressure  Imaging Studies ordered:   Additional history obtained:   Additional history obtained from relative  Previous records obtained and reviewed   Consultations Obtained:  Reevaluation:  After the interventions stated above, I reevaluated the patient and found improved  Critical Interventions:  .   Final Clinical Impression(s) / ED Diagnoses Final diagnoses:  Dizziness    Rx / DC Orders ED Discharge Orders    None       Milton Ferguson, MD 10/07/19 1134

## 2019-10-07 NOTE — Telephone Encounter (Signed)
Patient's girlfriend called as patient is complaining of feeling unsteady on his feet and reports a slight headache. He was seen in the ER earlier for his symptoms and then underwent a planned colonoscopy. His girlfriend reports that his symptoms are improved compared to the prior night but she was concerned as they were persistent. His blood pressure is 142/80 on recent home check. She is concerned that holding his aspirin and plavix has caused this. I advised her to have Mr. Zeiders stay well-hydrated and take tylenol for his headache. I advised that holding his aspirin and plavix is not the cause of his symptoms.   Alric Quan, MD Cardiology [place]

## 2019-10-07 NOTE — Discharge Instructions (Addendum)
Go to your colonoscopy now

## 2019-10-07 NOTE — Anesthesia Postprocedure Evaluation (Signed)
Anesthesia Post Note  Patient: John Douglas  Procedure(s) Performed: COLONOSCOPY WITH PROPOFOL (N/A ) POLYPECTOMY SUBMUCOSAL TATTOO INJECTION BIOPSY     Patient location during evaluation: PACU Anesthesia Type: MAC Level of consciousness: awake and alert Pain management: pain level controlled Vital Signs Assessment: post-procedure vital signs reviewed and stable Respiratory status: spontaneous breathing, nonlabored ventilation, respiratory function stable and patient connected to nasal cannula oxygen Cardiovascular status: stable and blood pressure returned to baseline Postop Assessment: no apparent nausea or vomiting Anesthetic complications: no    Last Vitals:  Vitals:   10/07/19 1231 10/07/19 1405  BP: (!) 171/110 135/76  Pulse:  (!) 55  Resp: 10 15  Temp: 37.1 C   SpO2: 99% 100%    Last Pain:  Vitals:   10/07/19 1405  TempSrc:   PainSc: 0-No pain                 Adair Lauderback

## 2019-10-07 NOTE — Anesthesia Preprocedure Evaluation (Signed)
Anesthesia Evaluation  Patient identified by MRN, date of birth, ID band Patient awake    Reviewed: Allergy & Precautions, H&P , NPO status , Patient's Chart, lab work & pertinent test results, reviewed documented beta blocker date and time   Airway Mallampati: II  TM Distance: >3 FB Neck ROM: full    Dental no notable dental hx. (+) Teeth Intact, Caps   Pulmonary neg pulmonary ROS,    Pulmonary exam normal breath sounds clear to auscultation       Cardiovascular Exercise Tolerance: Good hypertension, Pt. on medications and Pt. on home beta blockers + CAD, + Past MI and + Cardiac Stents  + dysrhythmias Atrial Fibrillation and Ventricular Tachycardia  Rhythm:regular Rate:Normal  ECHO 21' EF 45-50%  Cath 21' Significant single-vessel coronary artery disease with diffuse mid LAD disease of up to 80-90%, extending into the ostium of D1. Mild to moderate, non-obstructive coronary artery disease involving the proximal LAD, LCx and RCA. Widely patent overlapping stents in the RCA extending from the ostium through the distal vessel. Successful PCI to the mid LAD using Synergy 2.5 x 38 mm drug-eluting stent (post-dilated to 2.9 mm) with 0% residual stenosis and TIMI-3 flow.  Jailed D2 branch exhibits 90% ostial stenosis but TIMI-3 flow.  This branch was not intervened upon.   Neuro/Psych negative neurological ROS  negative psych ROS   GI/Hepatic negative GI ROS, Neg liver ROS,   Endo/Other  diabetes, Type 2Morbid obesity  Renal/GU negative Renal ROS  negative genitourinary   Musculoskeletal   Abdominal   Peds  Hematology negative hematology ROS (+)   Anesthesia Other Findings   Reproductive/Obstetrics negative OB ROS                             Anesthesia Physical  Anesthesia Plan  ASA: III  Anesthesia Plan: MAC   Post-op Pain Management:    Induction: Intravenous  PONV Risk Score and  Plan:   Airway Management Planned: Mask, Natural Airway and Nasal Cannula  Additional Equipment:   Intra-op Plan:   Post-operative Plan:   Informed Consent: I have reviewed the patients History and Physical, chart, labs and discussed the procedure including the risks, benefits and alternatives for the proposed anesthesia with the patient or authorized representative who has indicated his/her understanding and acceptance.     Dental Advisory Given  Plan Discussed with: CRNA and Anesthesiologist  Anesthesia Plan Comments:         Anesthesia Quick Evaluation

## 2019-10-07 NOTE — Interval H&P Note (Signed)
History and Physical Interval Note:  10/07/2019 1:01 PM  John Douglas  has presented today for surgery, with the diagnosis of rectal bleeding.  The various methods of treatment have been discussed with the patient and family. After consideration of risks, benefits and other options for treatment, the patient has consented to  Procedure(s): COLONOSCOPY WITH PROPOFOL (N/A) as a surgical intervention.  The patient's history has been reviewed, patient examined, no change in status, stable for surgery.  I have reviewed the patient's chart and labs.  Questions were answered to the patient's satisfaction.     Dominic Pea [name]

## 2019-10-08 ENCOUNTER — Encounter (HOSPITAL_COMMUNITY): Payer: Self-pay | Admitting: Gastroenterology

## 2019-10-08 ENCOUNTER — Inpatient Hospital Stay (HOSPITAL_COMMUNITY): Payer: Medicare HMO

## 2019-10-08 ENCOUNTER — Inpatient Hospital Stay (HOSPITAL_COMMUNITY)
Admission: EM | Admit: 2019-10-08 | Discharge: 2019-10-15 | DRG: 065 | Disposition: A | Discharge status: Rehabilitation Facility | Payer: Medicare HMO | Attending: Internal Medicine | Admitting: Internal Medicine

## 2019-10-08 ENCOUNTER — Emergency Department (HOSPITAL_COMMUNITY): Payer: Medicare HMO

## 2019-10-08 DIAGNOSIS — E669 Obesity, unspecified: Secondary | ICD-10-CM | POA: Diagnosis present

## 2019-10-08 DIAGNOSIS — K644 Residual hemorrhoidal skin tags: Secondary | ICD-10-CM | POA: Diagnosis present

## 2019-10-08 DIAGNOSIS — I252 Old myocardial infarction: Secondary | ICD-10-CM

## 2019-10-08 DIAGNOSIS — I6389 Other cerebral infarction: Secondary | ICD-10-CM | POA: Diagnosis not present

## 2019-10-08 DIAGNOSIS — Z8249 Family history of ischemic heart disease and other diseases of the circulatory system: Secondary | ICD-10-CM | POA: Diagnosis not present

## 2019-10-08 DIAGNOSIS — I2510 Atherosclerotic heart disease of native coronary artery without angina pectoris: Secondary | ICD-10-CM | POA: Diagnosis present

## 2019-10-08 DIAGNOSIS — Z91030 Bee allergy status: Secondary | ICD-10-CM

## 2019-10-08 DIAGNOSIS — R970 Elevated carcinoembryonic antigen [CEA]: Secondary | ICD-10-CM | POA: Diagnosis not present

## 2019-10-08 DIAGNOSIS — I69354 Hemiplegia and hemiparesis following cerebral infarction affecting left non-dominant side: Secondary | ICD-10-CM

## 2019-10-08 DIAGNOSIS — I63431 Cerebral infarction due to embolism of right posterior cerebral artery: Secondary | ICD-10-CM | POA: Diagnosis not present

## 2019-10-08 DIAGNOSIS — R262 Difficulty in walking, not elsewhere classified: Secondary | ICD-10-CM | POA: Diagnosis present

## 2019-10-08 DIAGNOSIS — C19 Malignant neoplasm of rectosigmoid junction: Secondary | ICD-10-CM | POA: Diagnosis present

## 2019-10-08 DIAGNOSIS — Z888 Allergy status to other drugs, medicaments and biological substances status: Secondary | ICD-10-CM

## 2019-10-08 DIAGNOSIS — Z6831 Body mass index (BMI) 31.0-31.9, adult: Secondary | ICD-10-CM

## 2019-10-08 DIAGNOSIS — I255 Ischemic cardiomyopathy: Secondary | ICD-10-CM | POA: Diagnosis present

## 2019-10-08 DIAGNOSIS — C787 Secondary malignant neoplasm of liver and intrahepatic bile duct: Secondary | ICD-10-CM | POA: Diagnosis not present

## 2019-10-08 DIAGNOSIS — C778 Secondary and unspecified malignant neoplasm of lymph nodes of multiple regions: Secondary | ICD-10-CM | POA: Diagnosis not present

## 2019-10-08 DIAGNOSIS — E785 Hyperlipidemia, unspecified: Secondary | ICD-10-CM | POA: Diagnosis present

## 2019-10-08 DIAGNOSIS — I480 Paroxysmal atrial fibrillation: Secondary | ICD-10-CM | POA: Diagnosis not present

## 2019-10-08 DIAGNOSIS — Z87891 Personal history of nicotine dependence: Secondary | ICD-10-CM

## 2019-10-08 DIAGNOSIS — Z7902 Long term (current) use of antithrombotics/antiplatelets: Secondary | ICD-10-CM

## 2019-10-08 DIAGNOSIS — E782 Mixed hyperlipidemia: Secondary | ICD-10-CM | POA: Diagnosis not present

## 2019-10-08 DIAGNOSIS — Z20822 Contact with and (suspected) exposure to covid-19: Secondary | ICD-10-CM | POA: Diagnosis not present

## 2019-10-08 DIAGNOSIS — E1159 Type 2 diabetes mellitus with other circulatory complications: Secondary | ICD-10-CM | POA: Diagnosis present

## 2019-10-08 DIAGNOSIS — I10 Essential (primary) hypertension: Secondary | ICD-10-CM

## 2019-10-08 DIAGNOSIS — E1140 Type 2 diabetes mellitus with diabetic neuropathy, unspecified: Secondary | ICD-10-CM | POA: Diagnosis not present

## 2019-10-08 DIAGNOSIS — E1165 Type 2 diabetes mellitus with hyperglycemia: Secondary | ICD-10-CM | POA: Diagnosis not present

## 2019-10-08 DIAGNOSIS — E119 Type 2 diabetes mellitus without complications: Secondary | ICD-10-CM | POA: Diagnosis not present

## 2019-10-08 DIAGNOSIS — Z79899 Other long term (current) drug therapy: Secondary | ICD-10-CM

## 2019-10-08 DIAGNOSIS — J309 Allergic rhinitis, unspecified: Secondary | ICD-10-CM | POA: Diagnosis present

## 2019-10-08 DIAGNOSIS — Z23 Encounter for immunization: Secondary | ICD-10-CM | POA: Diagnosis present

## 2019-10-08 DIAGNOSIS — R29704 NIHSS score 4: Secondary | ICD-10-CM | POA: Diagnosis present

## 2019-10-08 DIAGNOSIS — I152 Hypertension secondary to endocrine disorders: Secondary | ICD-10-CM | POA: Diagnosis present

## 2019-10-08 DIAGNOSIS — I63531 Cerebral infarction due to unspecified occlusion or stenosis of right posterior cerebral artery: Secondary | ICD-10-CM | POA: Diagnosis not present

## 2019-10-08 DIAGNOSIS — R2689 Other abnormalities of gait and mobility: Secondary | ICD-10-CM | POA: Diagnosis not present

## 2019-10-08 DIAGNOSIS — I69344 Monoplegia of lower limb following cerebral infarction affecting left non-dominant side: Secondary | ICD-10-CM | POA: Diagnosis not present

## 2019-10-08 DIAGNOSIS — E876 Hypokalemia: Secondary | ICD-10-CM | POA: Diagnosis present

## 2019-10-08 DIAGNOSIS — I639 Cerebral infarction, unspecified: Secondary | ICD-10-CM | POA: Diagnosis not present

## 2019-10-08 DIAGNOSIS — K5730 Diverticulosis of large intestine without perforation or abscess without bleeding: Secondary | ICD-10-CM | POA: Diagnosis present

## 2019-10-08 DIAGNOSIS — Z955 Presence of coronary angioplasty implant and graft: Secondary | ICD-10-CM | POA: Diagnosis not present

## 2019-10-08 DIAGNOSIS — I4891 Unspecified atrial fibrillation: Secondary | ICD-10-CM | POA: Diagnosis present

## 2019-10-08 DIAGNOSIS — F419 Anxiety disorder, unspecified: Secondary | ICD-10-CM | POA: Diagnosis not present

## 2019-10-08 DIAGNOSIS — D649 Anemia, unspecified: Secondary | ICD-10-CM | POA: Diagnosis not present

## 2019-10-08 DIAGNOSIS — Z86010 Personal history of colonic polyps: Secondary | ICD-10-CM

## 2019-10-08 DIAGNOSIS — Z7982 Long term (current) use of aspirin: Secondary | ICD-10-CM

## 2019-10-08 DIAGNOSIS — K625 Hemorrhage of anus and rectum: Secondary | ICD-10-CM

## 2019-10-08 DIAGNOSIS — Z880 Allergy status to penicillin: Secondary | ICD-10-CM

## 2019-10-08 LAB — BASIC METABOLIC PANEL
Anion gap: 12 (ref 5–15)
BUN: 8 mg/dL (ref 8–23)
CO2: 19 mmol/L — ABNORMAL LOW (ref 22–32)
Calcium: 8.2 mg/dL — ABNORMAL LOW (ref 8.9–10.3)
Chloride: 106 mmol/L (ref 98–111)
Creatinine, Ser: 0.9 mg/dL (ref 0.61–1.24)
GFR calc Af Amer: >60 mL/min (ref >60)
GFR calc non Af Amer: >60 mL/min (ref >60)
Glucose, Bld: 194 mg/dL — ABNORMAL HIGH (ref 70–99)
Potassium: 3.4 mmol/L — ABNORMAL LOW (ref 3.5–5.1)
Sodium: 137 mmol/L (ref 135–145)

## 2019-10-08 LAB — CBC
HCT: 38.1 % — ABNORMAL LOW (ref 39.0–52.0)
Hemoglobin: 12.2 g/dL — ABNORMAL LOW (ref 13.0–17.0)
MCH: 27.5 pg (ref 26.0–34.0)
MCHC: 32 g/dL (ref 30.0–36.0)
MCV: 85.8 fL (ref 80.0–100.0)
Platelets: 256 10*3/uL (ref 150–400)
RBC: 4.44 MIL/uL (ref 4.22–5.81)
RDW: 15.6 % — ABNORMAL HIGH (ref 11.5–15.5)
WBC: 12.3 10*3/uL — ABNORMAL HIGH (ref 4.0–10.5)
nRBC: 0 % (ref 0.0–0.2)

## 2019-10-08 LAB — SARS CORONAVIRUS 2 BY RT PCR (HOSPITAL ORDER, PERFORMED IN ~~LOC~~ HOSPITAL LAB): SARS Coronavirus 2: NEGATIVE

## 2019-10-08 LAB — MAGNESIUM: Magnesium: 1.8 mg/dL (ref 1.7–2.4)

## 2019-10-08 MED ORDER — ACETAMINOPHEN 650 MG RE SUPP: 650.0000 mg | RECTAL | Status: DC | PRN

## 2019-10-08 MED ORDER — ASPIRIN 81 MG PO CHEW
81.0000 mg | CHEWABLE_TABLET | Freq: Every day | ORAL | Status: DC
  Administered 2019-10-09 – 2019-10-15 (×7): 81 mg via ORAL
  Filled 2019-10-08 (×7): qty 1

## 2019-10-08 MED ORDER — STROKE: EARLY STAGES OF RECOVERY BOOK
Freq: Once | Status: AC
  Filled 2019-10-08: qty 1

## 2019-10-08 MED ORDER — EZETIMIBE 10 MG PO TABS
10.0000 mg | ORAL_TABLET | Freq: Every day | ORAL | Status: DC
  Administered 2019-10-08 – 2019-10-14 (×7): 10 mg via ORAL
  Filled 2019-10-08 (×7): qty 1

## 2019-10-08 MED ORDER — NITROGLYCERIN 0.4 MG SL SUBL: 0.4000 mg | SUBLINGUAL_TABLET | SUBLINGUAL | Status: DC | PRN

## 2019-10-08 MED ORDER — ROSUVASTATIN CALCIUM 20 MG PO TABS
20.0000 mg | ORAL_TABLET | Freq: Every day | ORAL | Status: DC
  Administered 2019-10-09 – 2019-10-15 (×7): 20 mg via ORAL
  Filled 2019-10-08 (×7): qty 1

## 2019-10-08 MED ORDER — CLOPIDOGREL BISULFATE 75 MG PO TABS: 75.0000 mg | ORAL_TABLET | Freq: Every day | ORAL | Status: DC

## 2019-10-08 MED ORDER — ACETAMINOPHEN 325 MG PO TABS
650.0000 mg | ORAL_TABLET | ORAL | Status: DC | PRN
  Administered 2019-10-08 – 2019-10-11 (×3): 650 mg via ORAL
  Filled 2019-10-08 (×3): qty 2

## 2019-10-08 MED ORDER — ACETAMINOPHEN 160 MG/5ML PO SOLN: 650.0000 mg | ORAL | Status: DC | PRN

## 2019-10-08 MED ORDER — POTASSIUM CHLORIDE 20 MEQ PO PACK
40.0000 meq | PACK | Freq: Once | ORAL | Status: DC
  Filled 2019-10-08: qty 2

## 2019-10-08 MED ORDER — LACTATED RINGERS IV BOLUS
1000.0000 mL | Freq: Once | INTRAVENOUS | Status: AC
  Administered 2019-10-08: 1000 mL via INTRAVENOUS

## 2019-10-08 MED ORDER — SODIUM CHLORIDE 0.9% FLUSH
3.0000 mL | Freq: Once | INTRAVENOUS | Status: AC
  Administered 2019-10-09: 3 mL via INTRAVENOUS

## 2019-10-08 NOTE — ED Triage Notes (Signed)
Pt brought in by [facility] with complaints of Afib RVR with HR of 150 bpm. Pt received 10 mg of cardizeim and 900 ml normal saline fluid prior to arrival. HR now at 64 bpm. C/O dizziness. Hx of plavix use.

## 2019-10-08 NOTE — H&P (Signed)
History and Physical    John Douglas YIR:[id number] DOB: Sep 02, 1955 DOA: 10/08/2019  PCP: Leonides Sake, MD  Patient coming from: Home  I have personally briefly reviewed patient's old medical records in [place]  Chief Complaint: Left leg weakness  HPI: John Douglas is a 64 y.o. male with medical history significant of coronary artery disease status post stents, paroxysmal A. fib, hypertension, diabetes, hyperlipidemia, marijuana abuse, tobacco abuse, obesity, recent GI bleeding status post colonoscopy on 10/07/2019 presents to emergency department due to left leg weakness and difficulty walking.  Patient tells me that he was feeling weak since Tuesday, yesterday he came to ER with complaint of dizziness and weakness which he thinks that because of bowel prep for colonoscopy.  His labs were drawn which came back within normal limits and he underwent scheduled colonoscopy without any complication.  This morning he was not able to get up and noticed left leg weakness/unsteadiness.  Reports intermittent dizziness and right-sided headache however denies fall, syncope, blurry vision, slurred speech, facial droop, chest pain, shortness of breath, palpitation, leg swelling, nausea, vomiting, diarrhea, abdominal pain, decreased appetite, weight loss, melena or hematemesis.  He lives alone.  Independent on daily life activities.  Quit chewing tobacco 3 weeks ago.  Denies alcohol abuse however uses marijuana occasionally.  He is compliant with his home medication.  He took his aspirin and Plavix this morning.  ED Course: Upon arrival to ED.  Patient's vital signs stable.  CBC shows leukocytosis of 12.3, he is afebrile, potassium of 3.4, CT head without contrast shows right PCA territory infarction.  Chest x-ray negative for acute findings.  EDP consulted neurology.  [facility] hospitalist consulted for admission for stroke work-up.  Review of Systems: As per HPI otherwise negative.    Past  Medical History:  Diagnosis Date  . Allergic rhinitis    [facility]  . Anxiety   . Atrial fibrillation ([place])    [facility]  . Benign essential hypertension    [facility]  . Coronary artery disease    [facility]  . Diabetic neuropathy, type II diabetes mellitus ([place])    [facility]  . Mixed hyperlipidemia    [facility]  . Myocardial infarct [facility])    [facility]  . Obesity, unspecified    [facility]  . Rectal bleeding     [facility]  . Wide-complex tachycardia ([place]) 06/04/2019    Past Surgical History:  Procedure Laterality Date  . BIOPSY  10/07/2019   Procedure: BIOPSY;  Surgeon: Lavena Bullion, DO;  Location: [facility];  Service: Gastroenterology;;  . COLONOSCOPY WITH PROPOFOL N/A 10/07/2019   Procedure: COLONOSCOPY WITH PROPOFOL;  Surgeon: Lavena Bullion, DO;  Location: [facility];  Service: Gastroenterology;  Laterality: N/A;  . CORONARY ANGIOPLASTY     3 stents  . LEFT HEART CATH AND CORONARY ANGIOGRAPHY N/A 06/05/2019   Procedure: LEFT HEART CATH AND CORONARY ANGIOGRAPHY;  Surgeon: Nelva Bush, MD;  Location: [place] [facility];  Service: Cardiovascular;  Laterality: N/A;  . POLYPECTOMY  10/07/2019   Procedure: POLYPECTOMY;  Surgeon: Lavena Bullion, DO;  Location: [facility];  Service: Gastroenterology;;  . Lia Foyer TATTOO INJECTION  10/07/2019   Procedure: SUBMUCOSAL TATTOO INJECTION;  Surgeon: Lavena Bullion, DO;  Location: [facility];  Service: Gastroenterology;;     reports that he has never smoked. He quit smokeless tobacco use  about 5 months ago.  His smokeless tobacco use included chew. He reports current alcohol use. He reports current drug use. Drug: Marijuana.  Allergies  Allergen Reactions  . Ibuprofen Other (See Comments)    Was told to not take this    . Metformin And Related     Bloody stools   . Naproxen Other (See Comments)    Was told to not take this   . Penicillins Hives    Did it involve swelling of the face/tongue/throat, SOB, or low BP? Unk Did it involve sudden or severe rash/hives, skin peeling, or any reaction on the inside of your mouth or nose? Yes Did you need to seek medical attention at a hospital or doctor's office? Unk When did it last happen?"Childhood- 53 years ago" If all above answers are "NO", may proceed with cephalosporin use.   . [place] Venom [Bee Venom] Swelling and Other (See Comments)    Severe swelling where stung    Family History  Problem Relation Age of Onset  . Diverticulitis Mother   . Hypertension Father   . Heart disease Father        MI  . Prostate cancer Father   . Colon cancer Neg Hx   . Stomach cancer Neg Hx   . Pancreatic cancer Neg Hx   . Rectal cancer Neg Hx     Prior to Admission medications   Medication Sig Start Date End Date Taking? Authorizing Provider  aspirin 81 MG chewable tablet Chew 1 tablet (81 mg total) by mouth daily. 06/07/19  Yes Kathyrn Drown D, NP  clonazePAM (KLONOPIN) 1 MG disintegrating tablet Take 1 tablet (1 mg total) by mouth 2 (two) times daily as needed (pre-colonoscopy anxiety). 09/15/19  Yes Gatha Mayer, MD  clopidogrel (PLAVIX) 75 MG tablet Take 1 tablet (75 mg total) by mouth daily. 08/28/19  Yes End, Harrell Gave, MD  ezetimibe (ZETIA) 10 MG tablet Take 10 mg by mouth at bedtime. 05/25/19  Yes [provider]  fluticasone (FLONASE) 50 MCG/ACT nasal spray Place 1-2 sprays into both nostrils daily as needed for allergies or rhinitis (or seasonal allergies).   Yes [provider]  lisinopril (ZESTRIL) 2.5 MG tablet Take 2.5 mg by mouth daily. 05/25/19  Yes [provider]  metoprolol tartrate (LOPRESSOR) 50 MG tablet Take 1.5 tablets (75 mg total) by mouth 2 (two) times daily. 08/28/19  Yes End, Harrell Gave, MD   nitroGLYCERIN (NITROSTAT) 0.4 MG SL tablet Place 0.4 mg under the tongue every 5 (five) minutes as needed for chest pain.  05/25/19 05/24/20 Yes [provider]  rosuvastatin (CRESTOR) 20 MG tablet Take 1 tablet (20 mg total) by mouth daily. 09/30/19 12/29/19 Yes End, Harrell Gave, MD    Physical Exam: Vitals:   10/08/19 1635 10/08/19 1637 10/08/19 1638 10/08/19 1639  BP:      Pulse: 76 73 72 82  Resp: 15 18 19 18   Temp:      TempSrc:      SpO2: 96% 96% 95% 95%  Weight:      Height:        Constitutional: NAD, calm, comfortable, communicating well, alert and oriented. Eyes: PERRL, lids and conjunctivae normal ENMT: Mucous membranes are moist. Posterior pharynx clear of any exudate or lesions.Normal dentition.  Neck: normal, supple, no masses, no thyromegaly Respiratory: clear to auscultation bilaterally, no wheezing, no crackles. Normal respiratory effort. No accessory muscle use.  Cardiovascular: Regular rate and rhythm, no murmurs / rubs / gallops. No  extremity edema. 2+ pedal pulses. No carotid bruits.  Abdomen: no tenderness, no masses palpated. No hepatosplenomegaly. Bowel sounds positive.  Musculoskeletal: no clubbing / cyanosis. No joint deformity upper and lower extremities. Good ROM, no contractures. Normal muscle tone.  Skin: no rashes, lesions, ulcers. No induration Neurologic: CN 2-12 grossly intact. Sensation intact, DTR normal. Strength 5/5 in all 4.  Psychiatric: Normal judgment and insight. Alert and oriented x 3. Normal mood.    Labs on Admission: I have personally reviewed following labs and imaging studies  CBC: Recent Labs  Lab 10/07/19 1042 10/08/19 1128  WBC 8.5 12.3*  NEUTROABS 6.3  --   HGB 12.5* 12.2*  HCT 37.9* 38.1*  MCV 82.8 85.8  PLT 252 542   Basic Metabolic Panel: Recent Labs  Lab 10/07/19 1042 10/08/19 1128  NA 141 137  K 4.0 3.4*  CL 105 106  CO2 26 19*  GLUCOSE 167* 194*  BUN 16 8  CREATININE 0.87 0.90  CALCIUM 8.9 8.2*    GFR: Estimated Creatinine Clearance: 96.9 mL/min (by C-G formula based on SCr of 0.9 mg/dL). Liver Function Tests: Recent Labs  Lab 10/07/19 1042  AST 16  ALT 16  ALKPHOS 56  BILITOT 0.8  PROT 6.7  ALBUMIN 3.9   No results for input(s): LIPASE, AMYLASE in the last 168 hours. No results for input(s): AMMONIA in the last 168 hours. Coagulation Profile: No results for input(s): INR, PROTIME in the last 168 hours. Cardiac Enzymes: No results for input(s): CKTOTAL, CKMB, CKMBINDEX, TROPONINI in the last 168 hours. BNP (last 3 results) No results for input(s): PROBNP in the last 8760 hours. HbA1C: No results for input(s): HGBA1C in the last 72 hours. CBG: No results for input(s): GLUCAP in the last 168 hours. Lipid Profile: No results for input(s): CHOL, HDL, LDLCALC, TRIG, CHOLHDL, LDLDIRECT in the last 72 hours. Thyroid Function Tests: No results for input(s): TSH, T4TOTAL, FREET4, T3FREE, THYROIDAB in the last 72 hours. Anemia Panel: No results for input(s): VITAMINB12, FOLATE, FERRITIN, TIBC, IRON, RETICCTPCT in the last 72 hours. Urine analysis: No results found for: COLORURINE, APPEARANCEUR, LABSPEC, [place], GLUCOSEU, [place], BILIRUBINUR, KETONESUR, PROTEINUR, UROBILINOGEN, NITRITE, LEUKOCYTESUR  Radiological Exams on Admission: CT HEAD WO CONTRAST  Result Date: 10/08/2019 CLINICAL DATA:  Dizziness. EXAM: CT HEAD WITHOUT CONTRAST TECHNIQUE: Contiguous axial images were obtained from the base of the skull through the vertex without intravenous contrast. COMPARISON:  None. FINDINGS: Brain: There is abnormal mixed attenuation in the right PCA territory worrisome for infarct with associated petechial hemorrhage. The brain otherwise appears normal. No midline shift or hydrocephalus. Vascular: Atherosclerosis.  No hyperdense vessel is identified. Skull: Intact.  No focal lesion. Sinuses/Orbits: Negative Other: . none. IMPRESSION: Findings most consistent with a right PCA territory  infarct with associated petechial hemorrhage. Brain MRI with and without contrast is recommended for confirmation. Electronically Signed   By: Inge Rise M.D.   On: 10/08/2019 15:05   DG Chest Portable 1 View  Result Date: 10/08/2019 CLINICAL DATA:  complaints of Afib RVR with HR of 150 bpm. Pt received 10 mg of cardizeim and 900 ml normal saline fluid prior to arrival. HR now at 64 bpm. C/O dizziness.afib EXAM: PORTABLE CHEST 1 VIEW COMPARISON:  None. FINDINGS: Normal mediastinum and cardiac silhouette. Normal pulmonary vasculature. No evidence of effusion, infiltrate, or pneumothorax. No acute bony abnormality. IMPRESSION: No acute cardiopulmonary process. Electronically Signed   By: Suzy Bouchard M.D.   On: 10/08/2019 12:23    EKG: Independently reviewed.  Sinus rhythm, nonspecific diffuse T wave changes, no ST elevation or ST depression noted.  Assessment/Plan Principal Problem:   Ischemic stroke [facility]) Active Problems:   Paroxysmal atrial fibrillation (HCC)   Essential hypertension   Hyperlipidemia LDL goal <70   Coronary artery disease   Ischemic cardiomyopathy   Leukocytosis   Hypokalemia   Colon cancer (HCC)    Right PCA ischemic infarction: -Risk factor including hypertension, obesity, hyperlipidemia, tobacco abuse -admit forTelemetry monitoring -Allow for permissive hypertension for the first 24-48h - only treat PRN if SBP >408 mmHg or diastolic blood pressure >144. Blood pressures can be gradually normalized to SBP<140 upon discharge. -Ordered MRI brain without contrast, CTA head and neck, lipid panel, A1c -Reviewed transthoracic echo from 2/21-showed ejection fraction of 45 to 50%.   -Frequent neuro checks -Risk factor modification -Consult Neurology-await recommendation -PT/OT eval, Speech consult -Continue aspirin, Plavix and statin -Monitor vitals closely.  Hypertension: Well-controlled -Hold lisinopril and metoprolol to allow permissive hypertension.   Monitor blood pressure closely  Hyperlipidemia: Check lipid panel -Continue statin and Zetia  Coronary artery disease status post stents: Stable -No ACS symptoms reported. -Continue aspirin, statin, Plavix.  Hold lisinopril and metoprolol to allow permissive hypertension  History of rectal bleeding: Status post colonoscopy on 10/07/2019 -Reviewed pathology result concern for adenocarcinoma? -H&H is stable.  Hypokalemia: Replenished -Check magnesium level.  Repeat BMP tomorrow a.m.  Leukocytosis: WBC: 12.3 -Likely reactive.  Patient is afebrile. -Repeat CBC tomorrow AM.  Will check UA.   DVT prophylaxis: SCD/TED Code Status: Full code-confirmed with the patient Family Communication: Patient's daughter present at bedside.  Plan of care discussed with patient and his daughter at bedside in length and they verbalized understanding and agreed with it. Disposition Plan: To be determined Consults called: Neurology by EDP Admission status: Inpatient   Mckinley Jewel MD [facility]  If 7PM-7AM, please contact night-coverage [email or web address] Password [facility]  10/08/2019, 4:52 PM

## 2019-10-08 NOTE — ED Provider Notes (Signed)
[facility] EMERGENCY DEPARTMENT Provider Note   CSN: [id number] Arrival date & time: 10/08/19  1114     History Chief Complaint  Patient presents with  . Atrial Fibrillation    John Douglas is a 64 y.o. male.  Patient is a 64 year old male with a history of CAD status post 2 stents, paroxysmal atrial fibrillation on Plavix, diabetes and recent GI bleeding undergoing colonoscopy with concern for colon cancer who is presenting today with weakness and difficulty walking.  Patient was seen in the emergency room yesterday with complaint of dizziness and reported he thinks that he was just weak because of the bowel prep he been doing for his colonoscopy yesterday.  Patient had labs done yesterday which showed no acute findings and he went forward with a colonoscopy.  However daughter is now present and gives further information.  Daughter reports on Tuesday patient was walking had no issues and was his normal self and then starting yesterday patient has had significant difficulty walking to where he requires 1-2 person assist to get to the bathroom he swaying him very unsteady.  He also complains of intermittent dizziness but nothing that is persistent.  He states at times it feels like his legs will just not do what he wants them to do.  She also reports that he has had complaints of not being able to see things in his vision that should be very obvious to him and at times is also seems slightly confused.  Yesterday he did report a headache on the right side of his head but denies any headache today.  He is not hit his head or had any history of trauma but has lowered himself to the floor a few times because he reports his legs were just too weak.  Patient has had multiple bowel movements because of the bowel prep but denies any vomiting but has not eaten much since the colonoscopy.  He has not had any fever, cough or shortness of breath but when he woke up this morning his Fitbit  reported that his pulse rate was 150 and he called paramedics.  They were also called because he was still having difficulty ambulating.  Patient was given 10 mg of IV Cardizem in route which converted him to sinus rhythm.  Patient denies being on chronic anticoagulation.  The history is provided by the patient and a relative.  Atrial Fibrillation       Past Medical History:  Diagnosis Date  . Allergic rhinitis    [facility]  . Anxiety   . Atrial fibrillation ([place])    [facility]  . Benign essential hypertension    [facility]  . Coronary artery disease    [facility]  . Diabetic neuropathy, type II diabetes mellitus ([place])    [facility]  . Mixed hyperlipidemia    [facility]  . Myocardial infarct [facility])    [facility]  . Obesity, unspecified    [facility]  . Rectal bleeding    [facility]  . Wide-complex tachycardia ([place]) 06/04/2019    Patient Active Problem List   Diagnosis Date Noted  . Change in bowel habits   . Rectal bleeding   . Rectal mass   . Rectal polyp   . Polyp of descending colon   . Diverticulosis of colon without hemorrhage   . Gastrointestinal hemorrhage 08/29/2019  .  Coronary artery disease involving native coronary artery of native heart without angina pectoris 08/29/2019  . Ischemic cardiomyopathy 08/29/2019  . Paroxysmal atrial fibrillation (HCC)   . Essential hypertension   . Hyperlipidemia LDL goal <70   . Non-ST elevation (NSTEMI) myocardial infarction ([place])   . Ventricular tachycardia ([place]) 06/04/2019    Past Surgical History:  Procedure Laterality Date  . BIOPSY  10/07/2019   Procedure: BIOPSY;  Surgeon: Lavena Bullion, DO;  Location: [facility]COPY;  Service: Gastroenterology;;  . COLONOSCOPY WITH PROPOFOL N/A 10/07/2019   Procedure: COLONOSCOPY WITH  PROPOFOL;  Surgeon: Lavena Bullion, DO;  Location: [facility];  Service: Gastroenterology;  Laterality: N/A;  . CORONARY ANGIOPLASTY     3 stents  . LEFT HEART CATH AND CORONARY ANGIOGRAPHY N/A 06/05/2019   Procedure: LEFT HEART CATH AND CORONARY ANGIOGRAPHY;  Surgeon: Nelva Bush, MD;  Location: [facility] CV LAB;  Service: Cardiovascular;  Laterality: N/A;  . POLYPECTOMY  10/07/2019   Procedure: POLYPECTOMY;  Surgeon: Lavena Bullion, DO;  Location: [facility];  Service: Gastroenterology;;  . Lia Foyer TATTOO INJECTION  10/07/2019   Procedure: SUBMUCOSAL TATTOO INJECTION;  Surgeon: Lavena Bullion, DO;  Location: [facility];  Service: Gastroenterology;;       Family History  Problem Relation Age of Onset  . Diverticulitis Mother   . Hypertension Father   . Heart disease Father        MI  . Prostate cancer Father   . Colon cancer Neg Hx   . Stomach cancer Neg Hx   . Pancreatic cancer Neg Hx   . Rectal cancer Neg Hx     Social History   Tobacco Use  . Smoking status: Never Smoker  . Smokeless tobacco: Former Systems developer    Types: Secondary school teacher  . Vaping Use: Never used  Substance Use Topics  . Alcohol use: Yes    Comment: occasional  . Drug use: Yes    Types: Marijuana    Home Medications Prior to Admission medications   Medication Sig Start Date End Date Taking? Authorizing Provider  aspirin 81 MG chewable tablet Chew 1 tablet (81 mg total) by mouth daily. 06/07/19   Tommie Raymond, NP  clonazePAM (KLONOPIN) 1 MG disintegrating tablet Take 1 tablet (1 mg total) by mouth 2 (two) times daily as needed (pre-colonoscopy anxiety). 09/15/19   Gatha Mayer, MD  clopidogrel (PLAVIX) 75 MG tablet Take 1 tablet (75 mg total) by mouth daily. 08/28/19   End, Harrell Gave, MD  ezetimibe (ZETIA) 10 MG tablet Take 10 mg by mouth at bedtime. 05/25/19   [provider]  fluticasone (FLONASE) 50 MCG/ACT nasal spray Place 1-2 sprays into both nostrils daily as  needed for allergies or rhinitis (or seasonal allergies).    [provider]  lisinopril (ZESTRIL) 2.5 MG tablet Take 2.5 mg by mouth daily. 05/25/19   [provider]  metoprolol tartrate (LOPRESSOR) 50 MG tablet Take 1.5 tablets (75 mg total) by mouth 2 (two) times daily. 08/28/19   End, Harrell Gave, MD  nitroGLYCERIN (NITROSTAT) 0.4 MG SL tablet Place 0.4 mg under the tongue every 5 (five) minutes as needed for chest pain.  05/25/19 05/24/20  [provider]  rosuvastatin (CRESTOR) 20 MG tablet Take 1 tablet (20 mg total) by mouth daily. 09/30/19 12/29/19  End, Harrell Gave, MD    Allergies    Ibuprofen, Metformin and related, Naproxen, Penicillins, and Yellow jacket venom [bee venom]  Review of Systems   Review  of Systems  All other systems reviewed and are negative.   Physical Exam Updated Vital Signs BP 124/81 (BP Location: Right Arm)   Pulse 66   Temp 98.7 F (37.1 C) (Oral)   Resp 12   Ht 5\' 10"  (1.778 m)   Wt 99.8 kg   SpO2 97%   BMI 31.57 kg/m   Physical Exam Vitals and nursing note reviewed.  Constitutional:      General: He is not in acute distress.    Appearance: Normal appearance. He is well-developed.  HENT:     Head: Normocephalic and atraumatic.     Mouth/Throat:     Mouth: Mucous membranes are moist.  Eyes:     Extraocular Movements: Extraocular movements intact.     Conjunctiva/sclera: Conjunctivae normal.     Pupils: Pupils are equal, round, and reactive to light.  Cardiovascular:     Rate and Rhythm: Normal rate and regular rhythm.     Pulses: Normal pulses.     Heart sounds: No murmur heard.   Pulmonary:     Effort: Pulmonary effort is normal. No respiratory distress.     Breath sounds: Normal breath sounds. No wheezing or rales.  Abdominal:     General: There is no distension.     Palpations: Abdomen is soft.     Tenderness: There is no abdominal tenderness. There is no guarding or rebound.  Musculoskeletal:         General: No tenderness. Normal range of motion.     Cervical back: Normal range of motion and neck supple.     Right lower leg: No edema.     Left lower leg: No edema.  Skin:    General: Skin is warm and dry.     Findings: No erythema or rash.  Neurological:     Mental Status: He is alert and oriented to person, place, and time.     Sensory: No sensory deficit.     Motor: No weakness.     Gait: Gait abnormal.     Comments: Unsteady gait requiring 1 person assist  Psychiatric:        Mood and Affect: Mood normal.        Behavior: Behavior normal.     ED Results / Procedures / Treatments   Labs (all labs ordered are listed, but only abnormal results are displayed) Labs Reviewed  BASIC METABOLIC PANEL - Abnormal; Notable for the following components:      Result Value   Potassium 3.4 (*)    CO2 19 (*)    Glucose, Bld 194 (*)    Calcium 8.2 (*)    All other components within normal limits  CBC - Abnormal; Notable for the following components:   WBC 12.3 (*)    Hemoglobin 12.2 (*)    HCT 38.1 (*)    RDW 15.6 (*)    All other components within normal limits    EKG EKG Interpretation  Date/Time:  Thursday October 08 2019 11:15:17 EDT Ventricular Rate:  67 PR Interval:    QRS Duration: 101 QT Interval:  418 QTC Calculation: 442 R Axis:   14 Text Interpretation: Sinus rhythm Nonspecific repol abnormality, diffuse leads No significant change since last tracing Confirmed by Blanchie Dessert [phone]) on 10/08/2019 1:40:51 PM   Radiology CT HEAD WO CONTRAST  Result Date: 10/08/2019 CLINICAL DATA:  Dizziness. EXAM: CT HEAD WITHOUT CONTRAST TECHNIQUE: Contiguous axial images were obtained from the base of the skull through the vertex without  intravenous contrast. COMPARISON:  None. FINDINGS: Brain: There is abnormal mixed attenuation in the right PCA territory worrisome for infarct with associated petechial hemorrhage. The brain otherwise appears normal. No midline shift or  hydrocephalus. Vascular: Atherosclerosis.  No hyperdense vessel is identified. Skull: Intact.  No focal lesion. Sinuses/Orbits: Negative Other: . none. 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 Brain MRI with and without contrast is recommended for confirmation. Electronically Signed   By: Inge Rise M.D.   On: 10/08/2019 15:05   DG Chest Portable 1 View  Result Date: 10/08/2019 CLINICAL DATA:  complaints of Afib RVR with HR of 150 bpm. Pt received 10 mg of cardizeim and 900 ml normal saline fluid prior to arrival. HR now at 64 bpm. C/O dizziness.afib EXAM: PORTABLE CHEST 1 VIEW COMPARISON:  None. FINDINGS: Normal mediastinum and cardiac silhouette. Normal pulmonary vasculature. No evidence of effusion, infiltrate, or pneumothorax. No acute bony abnormality. IMPRESSION: No acute cardiopulmonary process. Electronically Signed   By: Suzy Bouchard M.D.   On: 10/08/2019 12:23    Procedures Procedures (including critical care time)  Medications Ordered in ED Medications  sodium chloride flush (NS) 0.9 % injection 3 mL (has no administration in time range)  lactated ringers bolus 1,000 mL (1,000 mLs Intravenous New Bag/Given 10/08/19 1242)    ED Course  I have reviewed the triage vital signs and the nursing notes.  Pertinent labs & imaging results that were available during my care of the patient were reviewed by me and considered in my medical decision making (see chart for details).    MDM Rules/Calculators/A&P                          Patient returning for persistent difficulty with gait and intermittent dizziness and confusion.  Patient does not give full history of his symptoms but when daughter arrived she gives some more accurate description of what is been going on.  Patient is in no acute distress at this time and symptoms have now been present for 2 days.  They were present prior to the colonoscopy yesterday.   However based on daughter's report his symptoms were not communicated well.  Her description of his symptoms is concerning for possible stroke as he has had difficulty ambulating but has normal strength when laying in the bed.  Also with the complaint of intermittent dizziness, paroxysmal A. fib but not fully anticoagulated concern for stroke.  He denies any headache or neck pain at this time concerning for vertebral dissection or ruptured aneurysm.  Electrolytes remain stable and low suspicion that that is the cause of his symptoms.  He may have some dehydration from the colon prep but given the severity of his symptoms we will do a CT to rule out acute process and if that is normal patient will need MRI to evaluate the cerebellum.  Patient's EKG upon arrival here is in sinus rhythm and unchanged from prior.  4:12 PM CT is consistent with PCA infarct with petechial hemorrhage.  Patient did take a dose of his Plavix this morning but had been holding for 8 days prior to his colonoscopy.  This is most likely the explanation for his difficulty walking.  Findings discussed with neurology who will come and evaluate the patient.  Patient will need admission for stroke work-up.  Patient's family was updated.  MDM Number of Diagnoses or Management Options   Amount and/or Complexity of Data  Reviewed Clinical lab tests: ordered and reviewed Tests in the radiology section of CPT: ordered and reviewed Tests in the medicine section of CPT: ordered and reviewed Decide to obtain previous medical records or to obtain history from someone other than the patient: yes Obtain history from someone other than the patient: yes Review and summarize past medical records: yes Discuss the patient with other providers: yes Independent visualization of images, tracings, or specimens: yes  Risk of Complications, Morbidity, and/or Mortality Presenting problems: moderate Diagnostic procedures: low Management options:  low  Patient Progress Patient progress: stable    Final Clinical Impression(s) / ED Diagnoses Final diagnoses:  Paroxysmal atrial fibrillation ([place])  Cerebrovascular accident (CVA), unspecified mechanism ([place])    Rx / DC Orders ED Discharge Orders    None       Blanchie Dessert, MD 10/08/19 [phone]

## 2019-10-08 NOTE — ED Provider Notes (Signed)
  Provider Note MRN:  [id number]  Arrival date & time: 10/08/19    ED Course and Medical Decision Making  Assumed care from Dr. Maryan Rued at shift change.  Patient has been feeling weak ever since colonoscopy, held Plavix for the procedure.  CT head today revealing acute PCA stroke with surrounding hemorrhage.  Neurologically without deficits, resting on my exam.  Dr. Leonel Ramsay has been consulted and will evaluate, accepted for admission by the hospital service.   .Critical Care Performed by: Maudie Flakes, MD Authorized by: Maudie Flakes, MD   Critical care provider statement:    Critical care time (minutes):  31   Critical care was necessary to treat or prevent imminent or life-threatening deterioration of the following conditions:  CNS failure or compromise (Acute ischemic stroke)   Critical care was time spent personally by me on the following activities:  Discussions with consultants, evaluation of patient's response to treatment, examination of patient, ordering and performing treatments and interventions, ordering and review of laboratory studies, ordering and review of radiographic studies, pulse oximetry, re-evaluation of patient's condition, obtaining history from patient or surrogate and review of old charts   I assumed direction of critical care for this patient from another provider in my specialty: yes      Final Clinical Impressions(s) / ED Diagnoses     ICD-10-CM   1. Paroxysmal atrial fibrillation (HCC)  I48.0   2. Cerebrovascular accident (CVA), unspecified mechanism ([place])  I63.9     ED Discharge Orders    None      Discharge Instructions   None     Barth Kirks. [place] Small, [place] [email or web address]    Maudie Flakes, MD 10/08/19 [phone]

## 2019-10-08 NOTE — Consult Note (Signed)
Neurology Consultation  Reason for Consult: Subacute stroke Referring Physician: Mckinley Jewel, MD  CC: Gait disturbance  History is obtained from: Patient and daughter  HPI: John Douglas is a 64 y.o. male with history of wide-complex tachycardia, rectal bleeding, myocardial infarct, diabetes, CAD, atrial fibrillation but on no anticoagulation.  Per daughter at 105 PM on 10/06/2019 patient became dizzy and disoriented which caused him to fall the floor.  That night he was taking medications for a colon prep for colonoscopy was had to have on Wednesday.  Due to his symptoms not resolving EMS was called on Wednesday.  Patient was brought to the ED.  While in the ED patient was given fluids, metoprolol and lisinopril.  It was thought that his dizziness was less likely secondary to his elevated blood pressure.  That day patient still had his colonoscopy.  Today patient was brought to [facility] by EMS and found to have A. fib with RVR and a heart rate of 150.  CT of head was obtained showing that he did have an infarct in the right PCA distribution.  Patient was then transferred to [facility].  Patient still complains of gait instability and leaning to the left.  Patient has no other complaints neurologically.  ED course  CT head shows-findings most consistent with a right PCA territory infarct with associated petechial hemorrhage.  LKW: 8 PM previous Tuesday tpa given?: no, out of window Premorbid modified Rankin scale (mRS): 0 NIH stroke score: 2   Past Medical History:  Diagnosis Date  . Allergic rhinitis    [facility]  . Anxiety   . Atrial fibrillation ([place])    [facility]  . Benign essential hypertension    [facility]  . Coronary artery disease    [facility]  . Diabetic neuropathy, type II diabetes mellitus ([place])    [facility]  . Mixed hyperlipidemia    [place]  [facility]  . Myocardial infarct [facility])    [facility]  . Obesity, unspecified    [facility]  . Rectal bleeding    [facility]  . Wide-complex tachycardia ([place]) 06/04/2019    Family History  Problem Relation Age of Onset  . Diverticulitis Mother   . Hypertension Father   . Heart disease Father        MI  . Prostate cancer Father   . Colon cancer Neg Hx   . Stomach cancer Neg Hx   . Pancreatic cancer Neg Hx   . Rectal cancer Neg Hx    Social History:   reports that he has never smoked. He quit smokeless tobacco use about 5 months ago.  His smokeless tobacco use included chew. He reports current alcohol use. He reports current drug use. Drug: Marijuana.  Medications  Current Facility-Administered Medications:  .   stroke: mapping our early stages of recovery book, , Does not apply, Once, Pahwani, Rinka R, MD .  acetaminophen (TYLENOL) tablet 650 mg, 650 mg, Oral, Q4H PRN, 650 mg at 10/08/19 1654 **OR** acetaminophen (TYLENOL) 160 MG/5ML solution 650 mg, 650 mg, Per Tube, Q4H PRN **OR** acetaminophen (TYLENOL) suppository 650 mg, 650 mg, Rectal, Q4H PRN, Pahwani, Rinka R, MD .  potassium chloride (KLOR-CON) packet 40 mEq, 40 mEq, Oral, Once, Pahwani, Rinka R, MD .  sodium chloride flush (NS) 0.9 % injection 3 mL, 3 mL, Intravenous, Once, Blanchie Dessert, MD  Current Outpatient Medications:  .  aspirin 81 MG chewable tablet, Chew 1 tablet (81 mg total) by mouth daily., Disp:  , Rfl:  .  clonazePAM (KLONOPIN) 1 MG disintegrating tablet, Take 1 tablet (1 mg total) by mouth 2 (two) times daily as needed (pre-colonoscopy anxiety)., Disp: 6 tablet, Rfl: 0 .  clopidogrel (PLAVIX) 75 MG tablet, Take 1 tablet (75 mg total) by mouth daily., Disp: 90 tablet, Rfl: 3 .  ezetimibe (ZETIA) 10 MG tablet, Take 10 mg by mouth at bedtime., Disp: , Rfl:  .  fluticasone (FLONASE) 50 MCG/ACT nasal spray, Place 1-2 sprays into both  nostrils daily as needed for allergies or rhinitis (or seasonal allergies)., Disp: , Rfl:  .  lisinopril (ZESTRIL) 2.5 MG tablet, Take 2.5 mg by mouth daily., Disp: , Rfl:  .  metoprolol tartrate (LOPRESSOR) 50 MG tablet, Take 1.5 tablets (75 mg total) by mouth 2 (two) times daily., Disp: 270 tablet, Rfl: 3 .  nitroGLYCERIN (NITROSTAT) 0.4 MG SL tablet, Place 0.4 mg under the tongue every 5 (five) minutes as needed for chest pain. , Disp: , Rfl:  .  rosuvastatin (CRESTOR) 20 MG tablet, Take 1 tablet (20 mg total) by mouth daily., Disp: 90 tablet, Rfl: 2  ROS:    General ROS: Positive for -instability with gait Psychological ROS: negative for - behavioral disorder, hallucinations, memory difficulties, mood swings or suicidal ideation Ophthalmic ROS: negative for - blurry vision, double vision, eye pain or loss of vision ENT ROS: negative for - epistaxis, nasal discharge, oral lesions, sore throat, tinnitus or vertigo Allergy and Immunology ROS: negative for - hives or itchy/watery eyes Hematological and Lymphatic ROS: negative for - bleeding problems, bruising or swollen lymph nodes Endocrine ROS: negative for - galactorrhea, hair pattern changes, polydipsia/polyuria or temperature intolerance Respiratory ROS: negative for - cough, hemoptysis, shortness of breath or wheezing Cardiovascular ROS: negative for - chest pain, dyspnea on exertion, edema or irregular heartbeat Gastrointestinal ROS: negative for - abdominal pain, diarrhea, hematemesis, nausea/vomiting or stool incontinence Genito-Urinary ROS: negative for - dysuria, hematuria, incontinence or urinary frequency/urgency Musculoskeletal ROS: negative for - joint swelling or muscular weakness Neurological ROS: as noted in HPI Dermatological ROS: negative for rash and skin lesion changes  Exam: Current vital signs: BP (!) 148/78   Pulse 78   Temp 98.7 F (37.1 C) (Oral)   Resp 14   Ht 5\' 10"  (1.778 m)   Wt 99.8 kg   SpO2 99%   BMI  31.57 kg/m  Vital signs in last 24 hours: Temp:  [98.7 F (37.1 C)] 98.7 F (37.1 C) (06/10 1121) Pulse Rate:  [60-112] 78 (06/10 1647) Resp:  [0-33] 14 (06/10 1647) BP: (119-154)/(68-88) 148/78 (06/10 1647) SpO2:  [94 %-100 %] 99 % (06/10 1647) Weight:  [99.8 kg] 99.8 kg (06/10 1122)   Constitutional: Appears well-developed and well-nourished.  Psych: Affect appropriate to situation Eyes: No scleral injection HENT: No OP obstrucion Head: Normocephalic.  Cardiovascular: Palpable Respiratory: Effort normal, non-labored breathing GI: Soft.  No distension. There is no tenderness.  Skin: WDI  Neuro: Mental Status: Patient is awake, alert, oriented to person, place, month, year, and situation. Speech-shows no dysarthria, naming intact, comprehension intact, repeating intact, no aphasia Patient is able to give a clear and coherent history. Cranial Nerves: II: Visual Fields are full.  III,IV, VI: EOMI without ptosis or diploplia. Pupils equal, round and reactive to light V: Facial sensation is symmetric to temperature VII: Slight right facial droop however looking at  his previous pictures on daughter's iPhone this is his baseline VIII: hearing is intact to voice X: Palat elevates symmetrically XI: Shoulder shrug is symmetric. XII: tongue is midline without atrophy or fasciculations.  Motor: 5/5 strength on the right upper and lower extremity.  5/5 strength on the left upper extremity.   4/5 strength on the left leg with positive drift Sensory: Sensation is symmetric to light touch and temperature in the arms and legs. Deep Tendon Reflexes: 2+ and symmetric in the biceps and patellae.  Plantars: Toes are downgoing bilaterally.  Cerebellar: Finger-nose-finger on left upper extremity showed dysmetria  Labs I have reviewed labs in epic and the results pertinent to this consultation are:   CBC    Component Value Date/Time   WBC 12.3 (H) 10/08/2019 1128   RBC 4.44  10/08/2019 1128   HGB 12.2 (L) 10/08/2019 1128   HGB 12.6 (L) 08/28/2019 0940   HCT 38.1 (L) 10/08/2019 1128   HCT 38.2 08/28/2019 0940   PLT 256 10/08/2019 1128   PLT 236 08/28/2019 0940   MCV 85.8 10/08/2019 1128   MCV 83 08/28/2019 0940   MCH 27.5 10/08/2019 1128   MCHC 32.0 10/08/2019 1128   RDW 15.6 (H) 10/08/2019 1128   RDW 15.7 (H) 08/28/2019 0940   LYMPHSABS 1.6 10/07/2019 1042   MONOABS 0.5 10/07/2019 1042   EOSABS 0.1 10/07/2019 1042   BASOSABS 0.0 10/07/2019 1042    CMP     Component Value Date/Time   NA 137 10/08/2019 1128   NA 140 08/28/2019 0940   K 3.4 (L) 10/08/2019 1128   CL 106 10/08/2019 1128   CO2 19 (L) 10/08/2019 1128   GLUCOSE 194 (H) 10/08/2019 1128   BUN 8 10/08/2019 1128   BUN 21 08/28/2019 0940   CREATININE 0.90 10/08/2019 1128   CALCIUM 8.2 (L) 10/08/2019 1128   PROT 6.7 10/07/2019 1042   PROT 6.1 08/28/2019 0940   ALBUMIN 3.9 10/07/2019 1042   ALBUMIN 4.0 08/28/2019 0940   AST 16 10/07/2019 1042   ALT 16 10/07/2019 1042   ALKPHOS 56 10/07/2019 1042   BILITOT 0.8 10/07/2019 1042   BILITOT 0.5 08/28/2019 0940   GFRNONAA >60 10/08/2019 1128   GFRAA >60 10/08/2019 1128    Lipid Panel     Component Value Date/Time   CHOL 78 (L) 08/28/2019 0940   TRIG 87 08/28/2019 0940   HDL 28 (L) 08/28/2019 0940   CHOLHDL 2.8 08/28/2019 0940   CHOLHDL 3.4 06/05/2019 0337   VLDL 20 06/05/2019 0337   LDLCALC 32 08/28/2019 0940     Imaging I have reviewed the images obtained:  CT-scan of the brain-findings most consistent with a right PCA territory infarct with associated petechial hemorrhage.  Etta Quill PA-C [facility] [phone]  M-F  (9:00 am- 5:00 PM)  10/08/2019, 5:49 PM     Assessment:  This is a 64 year old male presenting to the hospital secondary to sudden onset of gait instability and listing to the left.  CT of head was obtained which confirmed a right PCA territory infarct.  Patient continues to have listing to  the left and weakness in the left leg along with dysmetria of his left arm on exam.  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   You can look them up  on [email or web address]  Password [email or web address]    NEUROHOSPITALIST ADDENDUM Performed a face to face diagnostic evaluation.   64 year old male with past medical history of atrial fibrillation previously on anticoagulation but discontinued recently, coronary artery disease with STEMI in Dec 2020  status post PCI currently on dual antiplatelet therapy with aspirin and Plavix (previously on Eliquis and Brilinta-continue due to dark stools).   He states that on Tuesday at 8 PM felt his left leg become weak, also felt dizzy and symptoms persisted so EMS was called the following day -was taken to the ED and felt this was due to elevated blood pressure.  Underwent colonoscopy.  Patient was brought to the hospital today after developed A. fib with RVR he underwent CT head which showed  right PCA infarction with petechial hemorrhagic conversion.  Patient just restarted Plavix yesterday.  Past medical history: Above Family history: No strokes at young age Social history denies smoking  On examination, patient has left visual neglect, left partial hemianopsia left upper extremity weakness and moderate lower left extremity weakness ( 4/5).  Ataxia in the left lower extremity, however not out of portion to weakness. 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 Fib, at that time  discontinue aspirin -BP goal 1 57-3 60 systolic -High-dose statin daily -CTA head and neck, echocardiogram, A1c, lipid profile -PT OT evaluation -Swallow evaluation   Stroke team will follow studies and make further recommendations    Evynn Boutelle MD [facility] [phone]   If 7pm to 7am, please call on call as listed on AMION.

## 2019-10-08 NOTE — Op Note (Signed)
[facility] Patient Name: John Douglas Procedure Date: 10/07/2019 MRN: [id number] Attending MD: Gerrit Heck , MD Date of Birth: [birth year] CSN: [id number] Age: 64 Admit Type: Outpatient Procedure:                Colonoscopy Indications:              This is the patient's first colonoscopy,                            Hematochezia, Diarrhea Providers:                Gerrit Heck, MD, Truddie Coco, RN, Doristine Johns, RN Referring MD:             Gatha Mayer, MD Medicines:                Monitored Anesthesia Care Complications:            No immediate complications. Estimated Blood Loss:     Estimated blood loss was minimal. Estimated blood                            loss was minimal. Procedure:                Pre-Anesthesia Assessment:                           - Prior to the procedure, a History and Physical                            was performed, and patient medications and                            allergies were reviewed. The patient's tolerance of                            previous anesthesia was also reviewed. The risks                            and benefits of the procedure and the sedation                            options and risks were discussed with the patient.                            All questions were answered, and informed consent                            was obtained. Prior Anticoagulants: The patient has                            taken Plavix (clopidogrel), last dose was 5 days                            prior to  procedure. ASA Grade Assessment: III - A                            patient with severe systemic disease. After                            reviewing the risks and benefits, the patient was                            deemed in satisfactory condition to undergo the                            procedure.                           After obtaining informed consent, the colonoscope                             was passed under direct vision. Throughout the                            procedure, the patient's blood pressure, pulse, and                            oxygen saturations were monitored continuously. The                            CF-HQ190L ([id number]) Olympus colonoscope was                            introduced through the anus and advanced to the the                            cecum, identified by appendiceal orifice and                            ileocecal valve. The colonoscopy was technically                            difficult and complex due to poor bowel prep. The                            patient tolerated the procedure well. The quality                            of the bowel preparation was poor. The ileocecal                            valve, appendiceal orifice, and rectum were                            photographed. Scope In: 1:25:26 PM Scope Out: 1:56:02 PM Scope Withdrawal Time: 0 hours 20 minutes 30 seconds  Total Procedure Duration: 0 hours 30 minutes 36 seconds  Findings:  Skin tags were found on perianal exam.      A fungating and ulcerated partially obstructing large mass was found in       the proximal rectum, located 7 cm from the anal verge. The mass was       circumferential. The mass measured eight cm in length. Oozing was       present. Could not traverse with the standard colonoscope, so this was       exchanged for the ultra-slim colonoscope in order to accomplish the       maneuver. Able to traverse with the ultra-slim colonoscope. This was       biopsied with a cold forceps for histology. Area 3 cm proximal and 1 cm       distal to the mass was tattooed with an injection of a total 5 mL of       Spot (carbon black). Estimated blood loss was minimal. There was a 10 mm       polyp located immediately proximal to the mass. Given need for dual       antiplatelet therapy, and as this fell within the tattooed area, this       was not resected.       A 5 mm polyp was found in the descending colon. The polyp was sessile.       The polyp was removed with a cold snare. Resection and retrieval were       complete. Estimated blood loss was minimal.      A 2 mm polyp was found in the distal rectum. The polyp was sessile. The       polyp was removed with a cold biopsy forceps. Resection and retrieval       were complete. Estimated blood loss was minimal.      A single small-mouthed diverticulum was found in the sigmoid colon.      A large amount of stool was found in the entire colon, interfering with       visualization. Lavage of the area was performed using copious amounts of       tap water, resulting in incomplete clearance with fair visualization. Impression:               - Preparation of the colon was poor.                           - Perianal skin tags found on perianal exam.                           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 Biopsied. Tattooed.                           - One 5 mm polyp in the descending colon, removed                            with a cold snare. Resected and retrieved.                           - One 2 mm polyp in the distal rectum,  removed with                            a cold biopsy forceps. Resected and retrieved.                           - Diverticulosis in the sigmoid colon.                           - Stool in the entire examined colon. Moderate Sedation:      Not Applicable - Patient had care per Anesthesia. Recommendation:           - Patient has a contact number available for                            emergencies. The signs and symptoms of potential                            delayed complications were discussed with the                            patient. Return to normal activities tomorrow.                            Written discharge instructions were provided to the                            patient.                           -  Resume previous diet.                           - Continue present medications.                           - Resume Plavix (clopidogrel) at prior dose                            tomorrow.                           - Await pathology results.                           - Refer to a colo-rectal surgeon at appointment to                            be scheduled.                           - Refer to an oncologist at appointment to be                            scheduled.                           -  Check CEA level.                           - Perform a CT scan (computed tomography) of chest                            with contrast, abdomen with contrast and pelvis                            with contrast at appointment to be scheduled.                           - Perform magnetic resonance imaging (MRI) with                            gadolinium at appointment to be scheduled.                           - Return to GI clinic in 2-3 weeks. Procedure Code(s):        --- Professional ---                           [phone], Colonoscopy, flexible; with removal of                            tumor(s), polyp(s), or other lesion(s) by snare                            technique                           45381, Colonoscopy, flexible; with directed                            submucosal injection(s), any substance                           70350, 84, Colonoscopy, flexible; with biopsy,                            single or multiple Diagnosis Code(s):        --- Professional ---                           C20, Malignant neoplasm of rectum                           K56.690, Other partial intestinal obstruction                           K63.5, Polyp of colon                           K62.1, Rectal polyp                           K64.4, Residual hemorrhoidal skin tags  K92.1, Melena (includes Hematochezia)                           R19.7, Diarrhea, unspecified                            K57.30, Diverticulosis of large intestine without                            perforation or abscess without bleeding CPT copyright 2019 [employer]. All rights reserved. The codes documented in this report are preliminary and upon coder review may  be revised to meet current compliance requirements. Gerrit Heck, MD 10/07/2019 2:15:11 PM Number of Addenda: 0

## 2019-10-08 NOTE — ED Notes (Signed)
Report given to Olivia, RN

## 2019-10-09 ENCOUNTER — Inpatient Hospital Stay (HOSPITAL_COMMUNITY): Payer: Medicare HMO

## 2019-10-09 ENCOUNTER — Encounter (HOSPITAL_COMMUNITY): Payer: Self-pay | Admitting: Internal Medicine

## 2019-10-09 DIAGNOSIS — I480 Paroxysmal atrial fibrillation: Secondary | ICD-10-CM

## 2019-10-09 DIAGNOSIS — E785 Hyperlipidemia, unspecified: Secondary | ICD-10-CM

## 2019-10-09 DIAGNOSIS — I639 Cerebral infarction, unspecified: Secondary | ICD-10-CM

## 2019-10-09 DIAGNOSIS — I10 Essential (primary) hypertension: Secondary | ICD-10-CM

## 2019-10-09 DIAGNOSIS — I6389 Other cerebral infarction: Secondary | ICD-10-CM

## 2019-10-09 DIAGNOSIS — C19 Malignant neoplasm of rectosigmoid junction: Secondary | ICD-10-CM

## 2019-10-09 DIAGNOSIS — E119 Type 2 diabetes mellitus without complications: Secondary | ICD-10-CM

## 2019-10-09 LAB — CBC WITH DIFFERENTIAL/PLATELET
Abs Immature Granulocytes: 0.07 10*3/uL (ref 0.00–0.07)
Basophils Absolute: 0 10*3/uL (ref 0.0–0.1)
Basophils Relative: 0 %
Eosinophils Absolute: 0 10*3/uL (ref 0.0–0.5)
Eosinophils Relative: 1 %
HCT: 37 % — ABNORMAL LOW (ref 39.0–52.0)
Hemoglobin: 12 g/dL — ABNORMAL LOW (ref 13.0–17.0)
Immature Granulocytes: 1 %
Lymphocytes Relative: 20 %
Lymphs Abs: 1.8 10*3/uL (ref 0.7–4.0)
MCH: 27.5 pg (ref 26.0–34.0)
MCHC: 32.4 g/dL (ref 30.0–36.0)
MCV: 84.7 fL (ref 80.0–100.0)
Monocytes Absolute: 0.6 10*3/uL (ref 0.1–1.0)
Monocytes Relative: 7 %
Neutro Abs: 6.3 10*3/uL (ref 1.7–7.7)
Neutrophils Relative %: 71 %
Platelets: 225 10*3/uL (ref 150–400)
RBC: 4.37 MIL/uL (ref 4.22–5.81)
RDW: 15.7 % — ABNORMAL HIGH (ref 11.5–15.5)
WBC: 8.8 10*3/uL (ref 4.0–10.5)
nRBC: 0 % (ref 0.0–0.2)

## 2019-10-09 LAB — GLUCOSE, CAPILLARY
Glucose-Capillary: 148 mg/dL — ABNORMAL HIGH (ref 70–99)
Glucose-Capillary: 148 mg/dL — ABNORMAL HIGH (ref 70–99)
Glucose-Capillary: 277 mg/dL — ABNORMAL HIGH (ref 70–99)
Glucose-Capillary: 154 mg/dL — ABNORMAL HIGH (ref 70–99)

## 2019-10-09 LAB — URINALYSIS, ROUTINE W REFLEX MICROSCOPIC
Bilirubin Urine: NEGATIVE
Glucose, UA: 50 mg/dL — AB
Hgb urine dipstick: NEGATIVE
Ketones, ur: NEGATIVE mg/dL
Leukocytes,Ua: NEGATIVE
Nitrite: NEGATIVE
Protein, ur: NEGATIVE mg/dL
Specific Gravity, Urine: 1.041 — ABNORMAL HIGH (ref 1.005–1.030)
pH: 6 (ref 5.0–8.0)

## 2019-10-09 LAB — LIPID PANEL
Cholesterol: 148 mg/dL (ref 0–200)
HDL: 26 mg/dL — ABNORMAL LOW (ref >40)
LDL Cholesterol: 98 mg/dL (ref 0–99)
Total CHOL/HDL Ratio: 5.7 RATIO
Triglycerides: 119 mg/dL (ref <150)
VLDL: 24 mg/dL (ref 0–40)

## 2019-10-09 LAB — HEMOGLOBIN A1C
Hgb A1c MFr Bld: 8 % — ABNORMAL HIGH (ref 4.8–5.6)
Mean Plasma Glucose: 182.9 mg/dL

## 2019-10-09 LAB — BASIC METABOLIC PANEL
Anion gap: 9 (ref 5–15)
BUN: 10 mg/dL (ref 8–23)
CO2: 22 mmol/L (ref 22–32)
Calcium: 8.6 mg/dL — ABNORMAL LOW (ref 8.9–10.3)
Chloride: 109 mmol/L (ref 98–111)
Creatinine, Ser: 0.94 mg/dL (ref 0.61–1.24)
GFR calc Af Amer: >60 mL/min (ref >60)
GFR calc non Af Amer: >60 mL/min (ref >60)
Glucose, Bld: 155 mg/dL — ABNORMAL HIGH (ref 70–99)
Potassium: 3.2 mmol/L — ABNORMAL LOW (ref 3.5–5.1)
Sodium: 140 mmol/L (ref 135–145)

## 2019-10-09 MED ORDER — IOHEXOL 350 MG/ML SOLN
100.0000 mL | Freq: Once | INTRAVENOUS | Status: AC | PRN
  Administered 2019-10-09: 100 mL via INTRAVENOUS

## 2019-10-09 MED ORDER — INSULIN ASPART 100 UNIT/ML ~~LOC~~ SOLN: 0.0000 [IU] | Freq: Every day | SUBCUTANEOUS | Status: DC

## 2019-10-09 MED ORDER — SODIUM CHLORIDE 0.9% FLUSH
10.0000 mL | INTRAVENOUS | Status: DC | PRN
  Administered 2019-10-09: 10 mL via INTRAVENOUS

## 2019-10-09 MED ORDER — POTASSIUM CHLORIDE CRYS ER 20 MEQ PO TBCR
20.0000 meq | EXTENDED_RELEASE_TABLET | Freq: Two times a day (BID) | ORAL | Status: AC
  Administered 2019-10-09 – 2019-10-10 (×4): 20 meq via ORAL
  Filled 2019-10-09 (×4): qty 1

## 2019-10-09 MED ORDER — INSULIN ASPART 100 UNIT/ML ~~LOC~~ SOLN
0.0000 [IU] | Freq: Three times a day (TID) | SUBCUTANEOUS | Status: DC
  Administered 2019-10-09: 5 [IU] via SUBCUTANEOUS
  Administered 2019-10-09: 1 [IU] via SUBCUTANEOUS
  Administered 2019-10-10 (×3): 2 [IU] via SUBCUTANEOUS
  Administered 2019-10-11: 1 [IU] via SUBCUTANEOUS
  Administered 2019-10-11 (×2): 2 [IU] via SUBCUTANEOUS
  Administered 2019-10-12 (×2): 1 [IU] via SUBCUTANEOUS
  Administered 2019-10-13: 2 [IU] via SUBCUTANEOUS
  Administered 2019-10-13: 1 [IU] via SUBCUTANEOUS
  Administered 2019-10-13: 2 [IU] via SUBCUTANEOUS
  Administered 2019-10-14: 3 [IU] via SUBCUTANEOUS
  Administered 2019-10-14 (×2): 1 [IU] via SUBCUTANEOUS
  Administered 2019-10-15: 2 [IU] via SUBCUTANEOUS
  Administered 2019-10-15: 1 [IU] via SUBCUTANEOUS

## 2019-10-09 NOTE — Progress Notes (Signed)
Rehab Admissions Coordinator Note:  Patient was screened by Cleatrice Burke for appropriateness for an Inpatient Acute Rehab Consult per therapy recs.   At this time, we are recommending Inpatient Rehab consult. I will place order per protocol.  Cleatrice Burke RN MSN 10/09/2019, 5:41 PM  I can be reached at [phone].

## 2019-10-09 NOTE — Evaluation (Signed)
Physical Therapy Evaluation Patient Details Name: John Douglas MRN: [id number] DOB: [birth year] Today's Date: 10/09/2019   History of Present Illness  John Douglas is a 64 y.o. male with medical history significant of coronary artery disease status post stents, paroxysmal A. fib, hypertension, diabetes, hyperlipidemia, marijuana abuse, tobacco abuse, obesity, recent GI bleeding status post colonoscopy on 10/07/2019 presents to emergency department due to left leg weakness and difficulty walking. MRI positive for R PCA infarct.  Clinical Impression  Patient received in bed. Reports he is feeling better today than yesterday. He has been up to bathroom with walker. he reports is left leg is still not right. General strength assessment is 5/5 in L LE. Mild decreased sensation reported in L LE. Patient has most difficulty with sit to stand, demonstrates wide BOS to perform task and required mod assist. Initially ambulated with hand held assist 5' with mod assist. Remainder of ambulation, 40 feet, was with RW. Patient required min/mod assist and max cues for improved step length and foot clearance on left. Decreased coordination noted. He will continue to benefit from skilled PT while here to improve safety, and independence with mobility.        Follow Up Recommendations CIR    Equipment Recommendations  Other (comment) (TBD)    Recommendations for Other Services Rehab consult     Precautions / Restrictions Precautions Precautions: Fall Restrictions Weight Bearing Restrictions: No      Mobility  Bed Mobility Overal bed mobility: Needs Assistance Bed Mobility: Supine to Sit     Supine to sit: Min assist     General bed mobility comments: min assist needed to raise trunk to seated position  Transfers Overall transfer level: Needs assistance Equipment used: 1 person hand held assist Transfers: Sit to/from Stand Sit to Stand: Mod assist         General transfer comment: Patient  stands with very wide stance. Cues to bring Left leg in toward right leg.  Ambulation/Gait Ambulation/Gait assistance: Mod assist;Min assist Gait Distance (Feet): 45 Feet Assistive device: Rolling walker (2 wheeled);1 person hand held assist Gait Pattern/deviations: Decreased step length - left;Shuffle;Wide base of support;Trunk flexed Gait velocity: decr   General Gait Details: patient initially ambulated 5 feet with hand held assist. Leaning moderately on me for support. Rest of gait training he used RW. Noted to have left LE lagging, decreased foot clearance. Improved with cues to take exaggerated step on left. Patient with motor planing difficulties and poor coordination  Stairs            Wheelchair Mobility    Modified Rankin (Stroke Patients Only) Modified Rankin (Stroke Patients Only) Pre-Morbid Rankin Score: No symptoms Modified Rankin: Moderately severe disability     Balance Overall balance assessment: Needs assistance Sitting-balance support: Feet supported Sitting balance-Leahy Scale: Good Sitting balance - Comments: no assist needed for sitting balance   Standing balance support: Bilateral upper extremity supported;During functional activity Standing balance-Leahy Scale: Fair Standing balance comment: reliant on UE assist and external assist for safety                             Pertinent Vitals/Pain Pain Assessment: No/denies pain    Home Living Family/patient expects to be discharged to:: Private residence Living Arrangements: Alone Available Help at Discharge: Family;Available PRN/intermittently Type of Home: House         Home Equipment: None      Prior Function Level of Independence: Independent  Comments: works, drives, fully independent prior to admission     Hand Dominance        Extremity/Trunk Assessment   Upper Extremity Assessment Upper Extremity Assessment: Defer to OT evaluation    Lower Extremity  Assessment Lower Extremity Assessment: LLE deficits/detail LLE Sensation: decreased light touch LLE Coordination: decreased gross motor    Cervical / Trunk Assessment Cervical / Trunk Assessment: Normal  Communication   Communication: No difficulties  Cognition Arousal/Alertness: Awake/alert Behavior During Therapy: WFL for tasks assessed/performed Overall Cognitive Status: Within Functional Limits for tasks assessed                                 General Comments: patient pleasant, talkative      General Comments      Exercises     Assessment/Plan    PT Assessment Patient needs continued PT services  PT Problem List Decreased mobility;Decreased safety awareness;Decreased activity tolerance;Decreased balance;Decreased knowledge of use of DME;Decreased knowledge of precautions;Decreased coordination       PT Treatment Interventions DME instruction;Therapeutic activities;Gait training;Therapeutic exercise;Patient/family education;Stair training;Balance training;Functional mobility training;Neuromuscular re-education    PT Goals (Current goals can be found in the Care Plan section)  Acute Rehab PT Goals Patient Stated Goal: to return to independent PT Goal Formulation: With patient/family Time For Goal Achievement: 10/23/19 Potential to Achieve Goals: Good    Frequency Min 4X/week   Barriers to discharge Decreased caregiver support      Co-evaluation               AM-PAC PT "6 Clicks" Mobility  Outcome Measure Help needed turning from your back to your side while in a flat bed without using bedrails?: None Help needed moving from lying on your back to sitting on the side of a flat bed without using bedrails?: A Little Help needed moving to and from a bed to a chair (including a wheelchair)?: A Little Help needed standing up from a chair using your arms (e.g., wheelchair or bedside chair)?: A Lot Help needed to walk in hospital room?: A Lot Help  needed climbing 3-5 steps with a railing? : A Lot 6 Click Score: 16    End of Session Equipment Utilized During Treatment: Gait belt Activity Tolerance: Patient tolerated treatment well Patient left: Other (comment) (left with OT in room, standing at sink) Nurse Communication: Mobility status;Precautions PT Visit Diagnosis: Unsteadiness on feet (R26.81);Other abnormalities of gait and mobility (R26.89);Hemiplegia and hemiparesis;Difficulty in walking, not elsewhere classified (R26.2) Hemiplegia - Right/Left: Left Hemiplegia - dominant/non-dominant: Non-dominant Hemiplegia - caused by: Cerebral infarction    Time: 1335-1406 PT Time Calculation (min) (ACUTE ONLY): 31 min   Charges:   PT Evaluation $PT Eval Moderate Complexity: 1 Mod PT Treatments $Gait Training: 8-22 mins        Chyanna Flock, PT, GCS 10/09/19,2:39 PM

## 2019-10-09 NOTE — Progress Notes (Signed)
OT Cancellation Note  Patient Details Name: John Douglas MRN: [id number] DOB: [birth year]   Cancelled Treatment:    Reason Eval/Treat Not Completed: Patient at procedure or test/ unavailable- ECHO in room.  Will follow and see as able.   Jolaine Artist, OT Acute Rehabilitation Services Pager [phone] Office [phone]   John Douglas 10/09/2019, 8:54 AM

## 2019-10-09 NOTE — Evaluation (Signed)
Speech Language Pathology Evaluation Patient Details Name: John Douglas MRN: [id number] DOB: 27-Dec-1955 Today's Date: 10/09/2019 Time: 1015-1040 SLP Time Calculation (min) (ACUTE ONLY): 25 min  Problem List:  Patient Active Problem List   Diagnosis Date Noted  . Ischemic stroke ([place]) 10/08/2019  . Leukocytosis   . Hypokalemia   . Colon cancer ([place])   . Change in bowel habits   . Rectal bleeding   . Rectal mass   . Rectal polyp   . Polyp of descending colon   . Diverticulosis of colon without hemorrhage   . Gastrointestinal hemorrhage 08/29/2019  . Coronary artery disease 08/29/2019  . Ischemic cardiomyopathy 08/29/2019  . Paroxysmal atrial fibrillation ([facility])   . Essential hypertension   . Hyperlipidemia LDL goal <70   . Non-ST elevation (NSTEMI) myocardial infarction ([place])   . Ventricular tachycardia ([place]) 06/04/2019   Past Medical History:  Past Medical History:  Diagnosis Date  . Allergic rhinitis    [facility]  . Anxiety   . Atrial fibrillation ([place])    [facility]  . Benign essential hypertension    [facility]  . Coronary artery disease    [facility]  . Diabetic neuropathy, type II diabetes mellitus ([place])    [facility]  . Mixed hyperlipidemia    [facility]  . Myocardial infarct [facility])    [facility]  . Obesity, unspecified    [facility]  . Rectal bleeding    [facility]  . Wide-complex tachycardia ([place]) 06/04/2019   Past Surgical History:  Past Surgical History:  Procedure Laterality Date  . BIOPSY  10/07/2019   Procedure: BIOPSY;  Surgeon: Lavena Bullion, DO;  Location: [facility];  Service: Gastroenterology;;  . COLONOSCOPY WITH PROPOFOL N/A 10/07/2019   Procedure: COLONOSCOPY WITH PROPOFOL;  Surgeon: Lavena Bullion, DO;  Location: [facility];  Service:  Gastroenterology;  Laterality: N/A;  . CORONARY ANGIOPLASTY     3 stents  . LEFT HEART CATH AND CORONARY ANGIOGRAPHY N/A 06/05/2019   Procedure: LEFT HEART CATH AND CORONARY ANGIOGRAPHY;  Surgeon: Nelva Bush, MD;  Location: [place] [facility];  Service: Cardiovascular;  Laterality: N/A;  . POLYPECTOMY  10/07/2019   Procedure: POLYPECTOMY;  Surgeon: Lavena Bullion, DO;  Location: [facility];  Service: Gastroenterology;;  . Lia Foyer TATTOO INJECTION  10/07/2019   Procedure: SUBMUCOSAL TATTOO INJECTION;  Surgeon: Lavena Bullion, DO;  Location: [facility];  Service: Gastroenterology;;   HPI:  64 year old gentleman admitted to [facility] ER with complaints of weakness, dragging his left foot, dizziness, and disorientation following bowel prep in anticipation of colonoscopy.   CT head showed right PCA distribution infarct and transferred to [facility].  PMH includes: STEMI and PCI to RCA in [id number]/[id number] complicated by sustained ventricular tachycardia, Non-STEMI and subsequent PCI to LAD on 06/2019 along with paroxysmal A. fib.   Assessment / Plan / Recommendation Clinical Impression   Pt's performance on delayed recall, clock drawing, mental math, and visual scanning tasks was [facility].  Speech was fluent and free from dysarthria or word finding difficulty.  Pt reports rapid improvement in his cognition and mobility since being admitted (I.e. confused and agitated on admission, bumping into things on his left side and having difficulty finding things on his left side earlier in his stay, resolving per pt report) but pt's daughter endorses subtle, high level residual cognitive changes.  Pt and family  are all in agreement that their primary concern is regaining pt's mobility.  As a result, pt would benefit from skilled ST follow up acutely for cognition to maximize functional independence and reduce burden of care prior to discharge.  Will update discharge recommendations pending progress made while  inpatient.      SLP Assessment  SLP Recommendation/Assessment: Patient needs continued Speech Lanaguage Pathology Services SLP Visit Diagnosis: Cognitive communication deficit (R41.841)    Follow Up Recommendations  Other (comment) (TBD pending progress made while inpatient)    Frequency and Duration min 1 x/week         SLP Evaluation Cognition  Overall Cognitive Status: Impaired/Different from baseline Arousal/Alertness: Awake/alert Orientation Level: Oriented X4 Attention: Selective Selective Attention: Appears intact Memory: Appears intact Awareness: Impaired Awareness Impairment: Emergent impairment Problem Solving: Impaired Problem Solving Impairment: Functional complex Safety/Judgment: Impaired       Comprehension  Auditory Comprehension Overall Auditory Comprehension: Appears within functional limits for tasks assessed    Expression Expression Primary Mode of Expression: Verbal Verbal Expression Overall Verbal Expression: Appears within functional limits for tasks assessed   Oral / Motor  Oral Motor/Sensory Function Overall Oral Motor/Sensory Function: Within functional limits Motor Speech Overall Motor Speech: Appears within functional limits for tasks assessed   GO                    Emilio Math 10/09/2019, 10:55 AM

## 2019-10-09 NOTE — Progress Notes (Signed)
PROGRESS NOTE    John Douglas  PJA:[id number] DOB: Jan 04, 1956 DOA: 10/08/2019 PCP: Leonides Sake, MD    Brief Narrative:  64 year old gentleman with extensive cardiovascular problems.  STEMI and PCI to RCA in 34/1937 complicated by sustained ventricular tachycardia .  Aspirin and Brilinta Non-STEMI and subsequent PCI to LAD on 06/2019 along with paroxysmal A. fib.  Aspirin and Plavix.  Anticoagulation deferred because of ongoing GI evaluation.  He was having intermittent hematochezia more than 6 months ago.  He attributed that to Metformin. Never had colonoscopy.  Because of STEMI and LAD stent, waited 3 months of dual antiplatelet therapy to get a colonoscopy.  6/9, Plavix held for 1 week and continued on aspirin, underwent colonoscopy, found to have adenocarcinoma of the colon with partially obstructive lesion. Since the day of colonoscopy or the night before only he was having some weakness, dragging his left foot, dizzy and disoriented.  He reported the symptoms while he was taking bowel prep that was thought to be from bowel prep.  6/10, went back to [facility] ER.  Found to be in A. fib with RVR heart rate 150.  CT head showed right PCA distribution infarct and transferred to [facility].   Assessment & Plan:   Principal Problem:   Ischemic stroke [facility]) Active Problems:   Paroxysmal atrial fibrillation (HCC)   Essential hypertension   Hyperlipidemia LDL goal <70   Coronary artery disease   Ischemic cardiomyopathy   Leukocytosis   Hypokalemia   Colon cancer (HCC)  Right PCA territory ischemic infarct with petechial hemorrhagic conversion: With history of paroxysmal A. Fib. Clinical findings, dizziness, lightheadedness and confusion, left-sided weakness. CT head findings, right PCA territory ischemic infarct. MRI of the brain, acute infarct right PCA territory, posterior limb of internal capsule.  Petechial blood within the posterior medial right temporal lobe.  No  mass-effect. CTA of the head and neck, no large vessel occlusion. 2D echocardiogram, pending today.  Echocardiogram 06/2019 with ejection fraction 45%. Antiplatelet therapy, aspirin at home continued.  Plavix on hold for the last 7 days, continue to hold because of petechial hemorrhage.  Patient will probably need anticoagulation once work-up completed. LDL pending today.  Last LDL 32 on 4/30.  On rosuvastatin and Zetia.  Continue. Hemoglobin A1c 8.  Not on treatment.  Was on Metformin in the past so he must be diagnosed with diabetes. Continue to work with PT OT.  Appreciate neurology input.  Paroxysmal atrial fibrillation:  Please in sinus rhythm today.  Resume metoprolol.  Anticoagulation on hold.  Rate controlled.  Coronary artery disease, ST elevation MI 03/2019, non-STEMI 05/2018 with VT:  Currently stable.  Without chest pain. Resume metoprolol and lisinopril today.  Stroke is 63 days old today.  On aspirin.  Plavix on hold.  Nitroglycerin as needed.  Hematochezia/recently diagnosed adenocarcinoma of the colon:  Diagnosed with adenocarcinoma 2 days ago.  Biopsy resulted.  Hematochezia resolved now.  Will need work-up, staging and may benefit with expedite oncology evaluation and treatment.  Discussed with his gastroenterologist.  Discussed and consult placed for oncology to follow-up.  Will hold off on ordering imaging studies and defer to oncology team.  Hypokalemia:  Further replace.   DVT prophylaxis: SCD Code Status: Full code Family Communication: Daughter at the bedside Disposition Plan: Status is: Inpatient  Remains inpatient appropriate because:Ongoing diagnostic testing needed not appropriate for outpatient work up and Inpatient level of care appropriate due to severity of illness   Dispo: The patient is from:  Home              Anticipated d/c is to: Home              Anticipated d/c date is: 2 days              Patient currently is not medically stable to d/c.          Consultants:   Oncology  Neurology  Procedures:   None  Antimicrobials:   None   Subjective: Patient seen and examined.  He had a good night.  Denies any complaints.  His younger daughter was at the bedside.  He complains of slight drift on the left leg otherwise no other weakness. Patient is aware about the new diagnosis of right-sided ischemic stroke, is also aware about diagnosis of adenocarcinoma and we discussed about initiating work-up and oncology consultation. He is eager and ready to work with therapies. He thinks Metformin started his rectal bleeding.  Objective: Vitals:   10/08/19 2313 10/09/19 0100 10/09/19 0300 10/09/19 0806  BP: 125/60 (!) 158/80 (!) 162/99 (!) 186/97  Pulse: 77 77 77 71  Resp: 18 18 19 18   Temp: 98.5 F (36.9 C) 98.5 F (36.9 C) 98.5 F (36.9 C) 98.2 F (36.8 C)  TempSrc: Oral Oral Oral Oral  SpO2: 98% 98% 98% 97%  Weight:      Height:        Intake/Output Summary (Last 24 hours) at 10/09/2019 1026 Last data filed at 10/09/2019 0930 Gross per 24 hour  Intake 150 ml  Output 200 ml  Net -50 ml   Filed Weights   10/08/19 1122  Weight: 99.8 kg    Examination:  General exam: Appears calm and comfortable  Respiratory system: Clear to auscultation. Respiratory effort normal. Cardiovascular system: S1 & S2 heard, RRR. No JVD, murmurs, rubs, gallops or clicks. No pedal edema. Gastrointestinal system: Abdomen is nondistended, soft and nontender. No organomegaly or masses felt. Normal bowel sounds heard. Central nervous system: Alert and oriented. No focal neurological deficits. Extremities: Left lower extremity 4/5 with slight drift.  Rest of the motor exam and sensory exam is normal. Skin: No rashes, lesions or ulcers Psychiatry: Judgement and insight appear normal. Mood & affect appropriate.     Data Reviewed: I have personally reviewed following labs and imaging studies  CBC: Recent Labs  Lab 10/07/19 1042 10/08/19 1128  10/09/19 0500  WBC 8.5 12.3* 8.8  NEUTROABS 6.3  --  6.3  HGB 12.5* 12.2* 12.0*  HCT 37.9* 38.1* 37.0*  MCV 82.8 85.8 84.7  PLT 252 256 413   Basic Metabolic Panel: Recent Labs  Lab 10/07/19 1042 10/08/19 1128 10/09/19 0500  NA 141 137 140  K 4.0 3.4* 3.2*  CL 105 106 109  CO2 26 19* 22  GLUCOSE 167* 194* 155*  BUN 16 8 10   CREATININE 0.87 0.90 0.94  CALCIUM 8.9 8.2* 8.6*  MG  --  1.8  --    GFR: Estimated Creatinine Clearance: 92.8 mL/min (by C-G formula based on SCr of 0.94 mg/dL). Liver Function Tests: Recent Labs  Lab 10/07/19 1042  AST 16  ALT 16  ALKPHOS 56  BILITOT 0.8  PROT 6.7  ALBUMIN 3.9   No results for input(s): LIPASE, AMYLASE in the last 168 hours. No results for input(s): AMMONIA in the last 168 hours. Coagulation Profile: No results for input(s): INR, PROTIME in the last 168 hours. Cardiac Enzymes: No results for input(s): CKTOTAL, CKMB, CKMBINDEX, TROPONINI  in the last 168 hours. BNP (last 3 results) No results for input(s): PROBNP in the last 8760 hours. HbA1C: Recent Labs    10/09/19 0500  HGBA1C 8.0*   CBG: No results for input(s): GLUCAP in the last 168 hours. Lipid Profile: No results for input(s): CHOL, HDL, LDLCALC, TRIG, CHOLHDL, LDLDIRECT in the last 72 hours. Thyroid Function Tests: No results for input(s): TSH, T4TOTAL, FREET4, T3FREE, THYROIDAB in the last 72 hours. Anemia Panel: No results for input(s): VITAMINB12, FOLATE, FERRITIN, TIBC, IRON, RETICCTPCT in the last 72 hours. Sepsis Labs: No results for input(s): PROCALCITON, LATICACIDVEN in the last 168 hours.  Recent Results (from the past 240 hour(s))  SARS CORONAVIRUS 2 (TAT 6-24 HRS) Nasopharyngeal Nasopharyngeal Swab     Status: None   Collection Time: 10/03/19  1:23 PM   Specimen: Nasopharyngeal Swab  Result Value Ref Range Status   SARS Coronavirus 2 NEGATIVE NEGATIVE Final    Comment: (NOTE) SARS-CoV-2 target nucleic acids are NOT DETECTED. The SARS-CoV-2  RNA is generally detectable in upper and lower respiratory specimens during the acute phase of infection. Negative results do not preclude SARS-CoV-2 infection, do not rule out co-infections with other pathogens, and should not be used as the sole basis for treatment or other patient management decisions. Negative results must be combined with clinical observations, patient history, and epidemiological information. The expected result is Negative. Fact Sheet for Patients: SugarRoll.be Fact Sheet for Healthcare Providers: https://www.woods-mathews.com/ This test is not yet approved or cleared by the [place] FDA and  has been authorized for detection and/or diagnosis of SARS-CoV-2 by FDA under an Emergency Use Authorization (EUA). This EUA will remain  in effect (meaning this test can be used) for the duration of the COVID-19 declaration under Section 56 4(b)(1) of the Act, 21 U.S.C. section 360bbb-3(b)(1), unless the authorization is terminated or revoked sooner. Performed at [facility], [place] [address] [place], [place] [address]   SARS Coronavirus 2 by RT PCR (hospital order, performed in [facility] hospital lab) Nasopharyngeal Nasopharyngeal Swab     Status: None   Collection Time: 10/08/19  5:40 PM   Specimen: Nasopharyngeal Swab  Result Value Ref Range Status   SARS Coronavirus 2 NEGATIVE NEGATIVE Final    Comment: (NOTE) SARS-CoV-2 target nucleic acids are NOT DETECTED.  The SARS-CoV-2 RNA is generally detectable in upper and lower respiratory specimens during the acute phase of infection. The lowest concentration of SARS-CoV-2 viral copies this assay can detect is 250 copies / mL. A negative result does not preclude SARS-CoV-2 infection and should not be used as the sole basis for treatment or other patient management decisions.  A negative result may occur with improper specimen collection / handling, submission of  specimen other than nasopharyngeal swab, presence of viral mutation(s) within the areas targeted by this assay, and inadequate number of viral copies (<250 copies / mL). A negative result must be combined with clinical observations, patient history, and epidemiological information.  Fact Sheet for Patients:   StrictlyIdeas.no  Fact Sheet for Healthcare Providers: BankingDealers.co.za  This test is not yet approved or  cleared by the [place] FDA and has been authorized for detection and/or diagnosis of SARS-CoV-2 by FDA under an Emergency Use Authorization (EUA).  This EUA will remain in effect (meaning this test can be used) for the duration of the COVID-19 declaration under Section 564(b)(1) of the Act, 21 U.S.C. section 360bbb-3(b)(1), unless the authorization is terminated or revoked sooner.  Performed at  [facility], [place] [address] [place], [place] [address]          Radiology Studies: CT ANGIO HEAD W OR WO CONTRAST  Result Date: 10/09/2019 CLINICAL DATA:  Stroke follow-up EXAM: CT ANGIOGRAPHY HEAD AND NECK TECHNIQUE: Multidetector CT imaging of the head and neck was performed using the standard protocol during bolus administration of intravenous contrast. Multiplanar CT image reconstructions and MIPs were obtained to evaluate the vascular anatomy. Carotid stenosis measurements (when applicable) are obtained utilizing NASCET criteria, using the distal internal carotid diameter as the denominator. CONTRAST:  129mL OMNIPAQUE IOHEXOL 350 MG/ML SOLN COMPARISON:  None. FINDINGS: CTA NECK FINDINGS SKELETON: There is no bony spinal canal stenosis. No lytic or blastic lesion. OTHER NECK: Normal pharynx, larynx and major salivary glands. No cervical lymphadenopathy. Unremarkable thyroid gland. UPPER CHEST: No pneumothorax or pleural effusion. No nodules or masses. AORTIC ARCH: There is no calcific atherosclerosis of the aortic  arch. There is no aneurysm, dissection or hemodynamically significant stenosis of the visualized portion of the aorta. Conventional 3 vessel aortic branching pattern. The visualized proximal subclavian arteries are widely patent. RIGHT CAROTID SYSTEM: Normal without aneurysm, dissection or stenosis. LEFT CAROTID SYSTEM: Normal without aneurysm, dissection or stenosis. VERTEBRAL ARTERIES: Left dominant configuration. Both origins are clearly patent. There is no dissection, occlusion or flow-limiting stenosis to the skull base (V1-V3 segments). CTA HEAD FINDINGS POSTERIOR CIRCULATION: --Vertebral arteries: Normal V4 segments. --Inferior cerebellar arteries: Normal. --Basilar artery: Normal. --Superior cerebellar arteries: Normal. --Posterior cerebral arteries (PCA): Normal. ANTERIOR CIRCULATION: --Intracranial internal carotid arteries: Normal. --Anterior cerebral arteries (ACA): Normal. Both A1 segments are present. Patent anterior communicating artery (a-comm). --Middle cerebral arteries (MCA): Normal. VENOUS SINUSES: As permitted by contrast timing, patent. ANATOMIC VARIANTS: None Other: There is hypoattenuation within the right PCA territory as demonstrated on earlier head CT and confirmed with MRI. No progression of suspected petechial hemorrhage. Review of the MIP images confirms the above findings. IMPRESSION: 1. Normal intracranial and cervical arteries. The right PCA is patent. 2. Unchanged appearance of right PCA territory ischemia with subacute to chronic blood products. Electronically Signed   By: Ulyses Jarred M.D.   On: 10/09/2019 02:43   CT HEAD WO CONTRAST  Result Date: 10/08/2019 CLINICAL DATA:  Dizziness. EXAM: CT HEAD WITHOUT CONTRAST TECHNIQUE: Contiguous axial images were obtained from the base of the skull through the vertex without intravenous contrast. COMPARISON:  None. FINDINGS: Brain: There is abnormal mixed attenuation in the right PCA territory worrisome for infarct with associated  petechial hemorrhage. The brain otherwise appears normal. No midline shift or hydrocephalus. Vascular: Atherosclerosis.  No hyperdense vessel is identified. Skull: Intact.  No focal lesion. Sinuses/Orbits: Negative Other: . none. 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 Brain MRI with and without contrast is recommended for confirmation. Electronically Signed   By: Inge Rise M.D.   On: 10/08/2019 15:05   CT ANGIO NECK W OR WO CONTRAST  Result Date: 10/09/2019 CLINICAL DATA:  Stroke follow-up EXAM: CT ANGIOGRAPHY HEAD AND NECK TECHNIQUE: Multidetector CT imaging of the head and neck was performed using the standard protocol during bolus administration of intravenous contrast. Multiplanar CT image reconstructions and MIPs were obtained to evaluate the vascular anatomy. Carotid stenosis measurements (when applicable) are obtained utilizing NASCET criteria, using the distal internal carotid diameter as the denominator. CONTRAST:  111mL OMNIPAQUE IOHEXOL 350 MG/ML SOLN COMPARISON:  None. FINDINGS: CTA NECK FINDINGS SKELETON: There is no bony spinal canal stenosis.  No lytic or blastic lesion. OTHER NECK: Normal pharynx, larynx and major salivary glands. No cervical lymphadenopathy. Unremarkable thyroid gland. UPPER CHEST: No pneumothorax or pleural effusion. No nodules or masses. AORTIC ARCH: There is no calcific atherosclerosis of the aortic arch. There is no aneurysm, dissection or hemodynamically significant stenosis of the visualized portion of the aorta. Conventional 3 vessel aortic branching pattern. The visualized proximal subclavian arteries are widely patent. RIGHT CAROTID SYSTEM: Normal without aneurysm, dissection or stenosis. LEFT CAROTID SYSTEM: Normal without aneurysm, dissection or stenosis. VERTEBRAL ARTERIES: Left dominant configuration. Both origins are clearly patent. There is no dissection, occlusion or flow-limiting  stenosis to the skull base (V1-V3 segments). CTA HEAD FINDINGS POSTERIOR CIRCULATION: --Vertebral arteries: Normal V4 segments. --Inferior cerebellar arteries: Normal. --Basilar artery: Normal. --Superior cerebellar arteries: Normal. --Posterior cerebral arteries (PCA): Normal. ANTERIOR CIRCULATION: --Intracranial internal carotid arteries: Normal. --Anterior cerebral arteries (ACA): Normal. Both A1 segments are present. Patent anterior communicating artery (a-comm). --Middle cerebral arteries (MCA): Normal. VENOUS SINUSES: As permitted by contrast timing, patent. ANATOMIC VARIANTS: None Other: There is hypoattenuation within the right PCA territory as demonstrated on earlier head CT and confirmed with MRI. No progression of suspected petechial hemorrhage. Review of the MIP images confirms the above findings. IMPRESSION: 1. Normal intracranial and cervical arteries. The right PCA is patent. 2. Unchanged appearance of right PCA territory ischemia with subacute to chronic blood products. Electronically Signed   By: Ulyses Jarred M.D.   On: 10/09/2019 02:43   MR BRAIN WO CONTRAST  Result Date: 10/08/2019 CLINICAL DATA:  Left leg weakness with difficulty walking. Dizziness. EXAM: MRI HEAD WITHOUT CONTRAST TECHNIQUE: Multiplanar, multiecho pulse sequences of the brain and surrounding structures were obtained without intravenous contrast. COMPARISON:  Head CT 10/08/2019 FINDINGS: Brain: There is abnormal diffusion restriction within the medial right temporal lobe and at the dorsolateral right thalamus and posterior globus pallidus, adjacent to the posterior limb of the internal capsule. There is intraparenchymal blood within the posteromedial right temporal lobe. No midline shift or other mass effect. Normal white matter signal. Normal volume of CSF spaces. No chronic microhemorrhage. Normal midline structures. Vascular: Normal flow voids. Skull and upper cervical spine: Normal marrow signal. Sinuses/Orbits: Negative.  Other: None. IMPRESSION: 1. Acute infarct of the right PCA territory, which affects the posterior limb of the right internal capsule, in keeping with reported left lower extremity weakness. 2. Petechial blood within the posteromedial right temporal lobe. No mass effect. Heidelberg classification 1b: HI2, confluent petechiae, no mass effect. Electronically Signed   By: Ulyses Jarred M.D.   On: 10/08/2019 20:58   DG Chest Portable 1 View  Result Date: 10/08/2019 CLINICAL DATA:  complaints of Afib RVR with HR of 150 bpm. Pt received 10 mg of cardizeim and 900 ml normal saline fluid prior to arrival. HR now at 64 bpm. C/O dizziness.afib EXAM: PORTABLE CHEST 1 VIEW COMPARISON:  None. FINDINGS: Normal mediastinum and cardiac silhouette. Normal pulmonary vasculature. No evidence of effusion, infiltrate, or pneumothorax. No acute bony abnormality. IMPRESSION: No acute cardiopulmonary process. Electronically Signed   By: Suzy Bouchard M.D.   On: 10/08/2019 12:23        Scheduled Meds: .  stroke: mapping our early stages of recovery book   Does not apply Once  . aspirin  81 mg Oral Daily  . ezetimibe  10 mg Oral QHS  . insulin aspart  0-5 Units Subcutaneous QHS  . insulin aspart  0-9 Units Subcutaneous TID WC  . potassium chloride  20  mEq Oral BID  . rosuvastatin  20 mg Oral Daily  . sodium chloride flush  3 mL Intravenous Once   Continuous Infusions:   LOS: 1 day    Time spent: 40 minutes    Barb Merino, MD [facility] Pager [phone]

## 2019-10-09 NOTE — Consult Note (Addendum)
[place]  Telephone:([phone] [phone]   MEDICAL ONCOLOGY - INITIAL CONSULTATION  Referral MD: Dr. Barb Merino  Reason for Referral: Colon adenocarcinoma  HPI: Mr. 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  The patient presented to the emergency department due to left leg weakness and difficulty walking.  The patient had reported that he had been feeling weak for several days and came to the emergency room on 10/07/2019 secondary to dizziness.  He related his dizziness secondary to the bowel prep for colonoscopy.  Had labs that were checked and came back within normal limits and he underwent the scheduled colonoscopy as planned.  The morning of admission, he reported that he was unable to get up and noticed that his left leg was weak/unsteady.  He reported ongoing intermittent dizziness and right-sided headache.  In the emergency room, his WBC was 12.3, hemoglobin 12.2, calcium 3.2, glucose 155, and calcium 8.6.  He had a CT of the head without contrast which showed findings most consistent with right PCA territory infarct with associated petechial hemorrhage.  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  He has been seen by neurology who recommended CTA head neck which showed normal intracranial and cervical arteries, right PCA patent, unchanged appearance of right PCA territory ischemia with subacute to chronic blood products.  The patient's 2 daughters are at the bedside and assist with  providing the history today.  The patient reports that he has been having intermittent hematochezia and loose stools for over 6 months.  He attributed some of his symptoms to Metformin.  The patient reports that he changed his diet and lost 40 pounds purposefully and was able to bring his hemoglobin A1c down to get off of Metformin.  He denies any changes in his appetite or unintentional weight loss.  He currently denies chest pain, shortness of breath, cough.  Reports occasional right-sided abdominal discomfort.  He thinks he has a hernia on that side.  He is not having any nausea, vomiting, constipation.  Again, he has loose stools and a lot of gas.  Denies prior history of colonoscopy prior to his colonoscopy on 10/07/2019.  The patient reports that his left-sided weakness is improving.  However, he reports that his left leg feels very restless.  He denies any numbness to his left leg.  The patient is currently living alone.  He lives in [place], [place].  His one daughter lives very close to him.  Medical oncology was asked to the patient to make recommendations regarding his newly diagnosed colon adenocarcinoma.   Past Medical History:  Diagnosis Date  . Allergic rhinitis    [facility]  . Anxiety   . Atrial fibrillation ([place])    [facility]  . Benign essential hypertension    [facility]  . Coronary artery disease    [facility]  . Diabetic neuropathy, type II diabetes mellitus ([place])    [facility]  . Mixed hyperlipidemia    [facility]  . Myocardial infarct ([place])  [place]  . Obesity, unspecified    [facility]  . Rectal bleeding    [facility]  . Wide-complex tachycardia ([place]) 06/04/2019  :  Past Surgical History:  Procedure Laterality Date  . BIOPSY  10/07/2019   Procedure: BIOPSY;  Surgeon:  Lavena Bullion, DO;  Location: [facility];  Service: Gastroenterology;;  . COLONOSCOPY WITH PROPOFOL N/A 10/07/2019   Procedure: COLONOSCOPY WITH PROPOFOL;  Surgeon: Lavena Bullion, DO;  Location: [facility];  Service: Gastroenterology;  Laterality: N/A;  . CORONARY ANGIOPLASTY     3 stents  . LEFT HEART CATH AND CORONARY ANGIOGRAPHY N/A 06/05/2019   Procedure: LEFT HEART CATH AND CORONARY ANGIOGRAPHY;  Surgeon: Nelva Bush, MD;  Location: [facility];  Service: Cardiovascular;  Laterality: N/A;  . POLYPECTOMY  10/07/2019   Procedure: POLYPECTOMY;  Surgeon: Lavena Bullion, DO;  Location: [facility];  Service: Gastroenterology;;  . Lia Foyer TATTOO INJECTION  10/07/2019   Procedure: SUBMUCOSAL TATTOO INJECTION;  Surgeon: Lavena Bullion, DO;  Location: [facility];  Service: Gastroenterology;;  :  Current Facility-Administered Medications  Medication Dose Route Frequency Provider Last Rate Last Admin  .  stroke: mapping our early stages of recovery book   Does not apply Once Pahwani, Rinka R, MD      . acetaminophen (TYLENOL) tablet 650 mg  650 mg Oral Q4H PRN Pahwani, Rinka R, MD   650 mg at 10/08/19 1654   Or  . acetaminophen (TYLENOL) 160 MG/5ML solution 650 mg  650 mg Per Tube Q4H PRN Pahwani, Rinka R, MD       Or  . acetaminophen (TYLENOL) suppository 650 mg  650 mg Rectal Q4H PRN Pahwani, Rinka R, MD      . aspirin chewable tablet 81 mg  81 mg Oral Daily Pahwani, Rinka R, MD   81 mg at 10/09/19 1012  . ezetimibe (ZETIA) tablet 10 mg  10 mg Oral QHS Pahwani, Rinka R, MD   10 mg at 10/08/19 2223  . insulin aspart (novoLOG) injection 0-5 Units  0-5 Units Subcutaneous QHS Ghimire, Dante Gang, MD      . insulin aspart (novoLOG) injection 0-9 Units  0-9 Units Subcutaneous TID WC Ghimire, Dante Gang, MD      . nitroGLYCERIN (NITROSTAT) SL tablet 0.4 mg  0.4 mg Sublingual Q5 min PRN Pahwani, Rinka R, MD      . potassium chloride SA (KLOR-CON) CR tablet 20 mEq  20 mEq Oral BID  Barb Merino, MD      . rosuvastatin (CRESTOR) tablet 20 mg  20 mg Oral Daily Pahwani, Rinka R, MD   20 mg at 10/09/19 1012  . sodium chloride flush (NS) 0.9 % injection 10 mL  10 mL Intravenous PRN Barb Merino, MD   10 mL at 10/09/19 0939  . sodium chloride flush (NS) 0.9 % injection 3 mL  3 mL Intravenous Once Blanchie Dessert, MD         Allergies  Allergen Reactions  . Ibuprofen Other (See Comments)    Was told to not take this   . Metformin And Related     Bloody stools   . Naproxen Other (See Comments)    Was told to not take this   . Penicillins Hives    Did it involve swelling of the face/tongue/throat, SOB, or low BP? Unk Did it involve sudden or severe rash/hives, skin peeling, or any reaction on the inside of your mouth or nose?  Yes Did you need to seek medical attention at a hospital or doctor's office? Unk When did it last happen?"Childhood- 55 years ago" If all above answers are "NO", may proceed with cephalosporin use.   . Yellow Jacket Venom [Bee Venom] Swelling and Other (See Comments)    Severe swelling where stung  :  Family History  Problem Relation Age of Onset  . Diverticulitis Mother   . Hypertension Father   . Heart disease Father        MI  . Prostate cancer Father   . Colon cancer Neg Hx   . Stomach cancer Neg Hx   . Pancreatic cancer Neg Hx   . Rectal cancer Neg Hx   :  Social History   Socioeconomic History  . Marital status: Divorced    Spouse name: Not on file  . Number of children: 2  . Years of education: Not on file  . Highest education level: Not on file  Occupational History  . Occupation: retired  Tobacco Use  . Smoking status: Never Smoker  . Smokeless tobacco: Former Systems developer    Types: Secondary school teacher  . Vaping Use: Never used  Substance and Sexual Activity  . Alcohol use: Yes    Comment: occasional  . Drug use: Yes    Types: Marijuana  . Sexual activity: Not on file  Other Topics Concern  . 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 Difficulty of Paying Living Expenses:   Food Insecurity:   . Worried About Charity fundraiser in the Last Year:   . Arboriculturist in the Last Year:   Transportation Needs:   . Film/video editor (Medical):   Marland Kitchen Lack of Transportation (Non-Medical):   Physical Activity:   . Days of Exercise per Week:   . Minutes of Exercise per Session:   Stress:   . Feeling of Stress :   Social Connections:   . Frequency of Communication with Friends and Family:   . Frequency of Social Gatherings with Friends and Family:   . Attends Religious Services:   . Active Member of Clubs or Organizations:   . Attends Archivist Meetings:   Marland Kitchen Marital Status:   Intimate Partner Violence:   . Fear of Current or Ex-Partner:   . Emotionally Abused:   Marland Kitchen Physically Abused:   . Sexually Abused:   :  Review of Systems: A comprehensive 14 point review of systems was negative except as noted in the HPI.  Exam: Patient Vitals for the past 24 hrs:  BP Temp Temp src Pulse Resp SpO2  10/09/19 1124 (!) 172/104 98.1 F (36.7 C) Oral 66 20 98 %  10/09/19 0806 (!) 186/97 98.2 F (36.8 C) Oral 71 18 97 %  10/09/19 0300 (!) 162/99 98.5 F (36.9 C) Oral 77 19 98 %  10/09/19 0100 (!) 158/80 98.5 F (36.9 C) Oral 77 18 98 %  10/08/19 2313 125/60 98.5 F (36.9 C) Oral 77 18 98 %  10/08/19 2117 (!) 164/79 98.5 F (36.9 C) Oral 63 18 98 %  10/08/19 1917 137/74 98.9 F (37.2 C) Oral 69 18 99 %  10/08/19 1817 (!) 144/85 99.1 F (37.3  C) Oral 69 16 100 %  10/08/19 1752 -- 99 F (37.2 C) Oral -- -- --  10/08/19 1730 (!) 142/77 -- -- 68 -- 96 %  10/08/19 1647 (!) 148/78 -- -- 78 14 99 %  10/08/19 1639 -- -- -- 82 18 95 %  10/08/19 1638 -- -- -- 72 19 95 %  10/08/19  1637 -- -- -- 73 18 96 %  10/08/19 1635 -- -- -- 76 15 96 %  10/08/19 1634 -- -- -- 72 18 96 %  10/08/19 1633 -- -- -- 72 17 96 %  10/08/19 1632 -- -- -- 76 18 97 %  10/08/19 1631 -- -- -- 73 18 95 %  10/08/19 1630 -- -- -- 72 18 95 %  10/08/19 1629 -- -- -- 72 18 95 %  10/08/19 1628 -- -- -- 71 18 95 %  10/08/19 1627 -- -- -- 73 18 95 %  10/08/19 1626 -- -- -- 73 17 95 %  10/08/19 1625 -- -- -- 71 18 96 %  10/08/19 1624 -- -- -- 73 18 95 %  10/08/19 1623 -- -- -- 73 18 95 %  10/08/19 1622 -- -- -- 72 18 95 %  10/08/19 1621 -- -- -- 71 18 94 %  10/08/19 1620 -- -- -- 72 18 94 %  10/08/19 1619 -- -- -- 73 18 94 %  10/08/19 1618 -- -- -- 72 18 94 %  10/08/19 1617 -- -- -- 71 18 94 %  10/08/19 1616 -- -- -- 72 19 94 %  10/08/19 1615 -- -- -- 73 18 95 %  10/08/19 1614 -- -- -- 71 18 94 %  10/08/19 1613 -- -- -- 72 17 95 %  10/08/19 1612 -- -- -- 72 15 95 %  10/08/19 1611 -- -- -- 72 13 95 %  10/08/19 1610 -- -- -- 75 11 95 %  10/08/19 1609 -- -- -- 73 11 96 %  10/08/19 1608 -- -- -- 73 13 94 %  10/08/19 1607 -- -- -- 74 12 96 %  10/08/19 1606 -- -- -- 74 14 96 %  10/08/19 1605 -- -- -- 77 (!) 24 96 %  10/08/19 1604 -- -- -- 74 16 95 %  10/08/19 1603 -- -- -- 72 14 95 %  10/08/19 1602 -- -- -- 73 16 96 %  10/08/19 1601 -- -- -- 73 15 95 %  10/08/19 1600 -- -- -- 74 14 96 %  10/08/19 1559 -- -- -- 73 11 96 %  10/08/19 1558 -- -- -- 72 (!) 27 95 %  10/08/19 1557 -- -- -- 72 12 95 %  10/08/19 1556 -- -- -- 71 13 96 %  10/08/19 1555 -- -- -- 70 14 95 %  10/08/19 1554 -- -- -- 71 17 94 %  10/08/19 1553 -- -- -- 71 (!) 22 96 %  10/08/19 1552 -- -- -- 69 13 97 %  10/08/19 1551 -- -- -- 70 18 97 %  10/08/19 1550 -- -- -- 68 12 97 %  10/08/19 1549 -- -- -- 70 12 97 %  10/08/19 1548 -- -- -- 70 13 96 %  10/08/19 1547 -- -- -- 67 16 96 %  10/08/19 1546 -- -- -- 71 17 97 %  10/08/19 1545 -- -- -- 72 13 96 %  10/08/19 1544 -- -- -- 74 17 96 %  10/08/19 1543 -- -- --  73 20 97 %   10/08/19 1542 -- -- -- 70 15 98 %  10/08/19 1541 -- -- -- 77 16 98 %  10/08/19 1540 -- -- -- 77 17 98 %  10/08/19 1539 -- -- -- 79 18 98 %  10/08/19 1538 -- -- -- 71 18 98 %  10/08/19 1537 -- -- -- 70 17 97 %  10/08/19 1536 -- -- -- 72 (!) 25 97 %  10/08/19 1535 -- -- -- 71 (!) 9 96 %  10/08/19 1511 -- -- -- 69 11 98 %  10/08/19 1510 -- -- -- 68 -- 98 %  10/08/19 1509 -- -- -- 68 13 99 %  10/08/19 1508 -- -- -- 73 (!) 24 96 %  10/08/19 1507 -- -- -- 67 (!) 33 97 %  10/08/19 1506 -- -- -- 72 20 99 %  10/08/19 1505 -- -- -- 72 17 97 %  10/08/19 1504 -- -- -- 69 16 97 %  10/08/19 1503 -- -- -- 68 18 98 %  10/08/19 1502 -- -- -- 63 20 96 %  10/08/19 1501 (!) 145/82 -- -- 65 17 96 %  10/08/19 1500 -- -- -- 63 18 96 %  10/08/19 1459 -- -- -- 65 20 98 %  10/08/19 1458 -- -- -- 65 17 97 %  10/08/19 1457 -- -- -- 66 13 97 %  10/08/19 1456 -- -- -- 65 10 97 %  10/08/19 1455 -- -- -- 66 12 98 %  10/08/19 1454 -- -- -- 67 11 99 %  10/08/19 1453 -- -- -- 70 12 98 %  10/08/19 1452 -- -- -- 71 -- 98 %  10/08/19 1451 (!) 154/87 -- -- 69 17 99 %  10/08/19 1450 -- -- -- 69 -- 95 %  10/08/19 1449 -- -- -- 69 -- 98 %  10/08/19 1448 -- -- -- 73 -- 100 %  10/08/19 1447 -- -- -- 71 -- 98 %  10/08/19 1446 -- -- -- 70 -- 98 %  10/08/19 1445 -- -- -- 71 -- 98 %  10/08/19 1444 -- -- -- 69 -- 98 %  10/08/19 1443 -- -- -- 69 -- 99 %  10/08/19 1441 -- -- -- 69 -- 96 %  10/08/19 1425 -- -- -- 73 -- 99 %  10/08/19 1424 -- -- -- 65 -- 98 %  10/08/19 1423 -- -- -- 68 -- 99 %  10/08/19 1422 -- -- -- 67 -- 99 %  10/08/19 1421 -- -- -- 64 -- 98 %  10/08/19 1420 -- -- -- 66 -- 100 %  10/08/19 1419 -- -- -- 66 -- 99 %  10/08/19 1418 -- -- -- 65 -- 100 %  10/08/19 1417 -- -- -- 65 -- 100 %  10/08/19 1416 -- -- -- 66 -- 99 %  10/08/19 1415 124/86 -- -- 64 -- 100 %  10/08/19 1414 -- -- -- 65 -- 99 %  10/08/19 1413 -- -- -- 64 -- 98 %  10/08/19 1412 -- -- -- 64 -- 98 %  10/08/19 1411 -- -- -- 65 -- 98 %   10/08/19 1410 -- -- -- 65 -- 98 %  10/08/19 1409 -- -- -- 64 -- 98 %  10/08/19 1408 -- -- -- 66 -- 99 %  10/08/19 1407 -- -- -- 63 -- 98 %  10/08/19 1406 -- -- -- 64 -- 98 %  10/08/19 1405 -- -- -- 65 --  99 %  10/08/19 1404 -- -- -- 64 -- 98 %  10/08/19 1403 -- -- -- 64 -- 99 %  10/08/19 1402 -- -- -- 64 -- 99 %  10/08/19 1401 -- -- -- 64 -- 99 %  10/08/19 1400 121/87 -- -- 65 -- 99 %  10/08/19 1359 -- -- -- 63 -- 99 %  10/08/19 1358 -- -- -- 63 -- 98 %  10/08/19 1357 -- -- -- 64 -- 98 %  10/08/19 1356 -- -- -- 65 -- 99 %  10/08/19 1355 -- -- -- 63 -- 99 %  10/08/19 1354 -- -- -- 62 -- 98 %  10/08/19 1353 -- -- -- 62 -- 97 %  10/08/19 1352 -- -- -- 63 -- 98 %  10/08/19 1351 -- -- -- 63 -- 98 %  10/08/19 1350 -- -- -- 65 -- 100 %  10/08/19 1349 -- -- -- 62 -- 98 %  10/08/19 1348 -- -- -- 63 -- 98 %  10/08/19 1347 132/78 -- -- 63 -- 99 %  10/08/19 1346 -- -- -- 63 -- 98 %  10/08/19 1345 -- -- -- 65 -- 98 %  10/08/19 1344 -- -- -- 63 -- 98 %  10/08/19 1343 -- -- -- 65 -- 99 %  10/08/19 1342 -- -- -- 61 -- 98 %  10/08/19 1341 -- -- -- 62 -- 98 %  10/08/19 1340 -- -- -- 64 -- 99 %  10/08/19 1339 -- -- -- 64 -- 100 %  10/08/19 1338 -- -- -- 62 -- 99 %  10/08/19 1337 -- -- -- 62 -- 98 %  10/08/19 1336 -- -- -- 64 -- 99 %  10/08/19 1335 -- -- -- 63 -- 99 %  10/08/19 1334 -- -- -- 65 -- 98 %  10/08/19 1333 -- -- -- 64 -- 98 %  10/08/19 1332 135/69 -- -- 63 -- 98 %  10/08/19 1331 -- -- -- 65 -- 98 %  10/08/19 1330 -- -- -- 64 -- 97 %  10/08/19 1329 -- -- -- 62 -- 99 %  10/08/19 1328 -- -- -- 65 -- 97 %  10/08/19 1327 -- -- -- 63 -- 100 %  10/08/19 1326 -- -- -- 66 -- 99 %  10/08/19 1325 -- -- -- 80 -- 98 %  10/08/19 1324 -- -- -- 91 (!) 0 98 %  10/08/19 1323 -- -- -- 77 -- 99 %  10/08/19 1322 -- -- -- 71 -- 100 %  10/08/19 1321 -- -- -- 71 -- 96 %  10/08/19 1320 -- -- -- 71 -- 98 %  10/08/19 1319 -- -- -- 73 -- 98 %  10/08/19 1318 -- -- -- 70 -- 100 %  10/08/19 1317 (!)  153/87 -- -- 70 14 99 %  10/08/19 1316 -- -- -- 71 12 99 %  10/08/19 1315 -- -- -- 94 14 99 %  10/08/19 1314 -- -- -- 75 15 98 %  10/08/19 1313 -- -- -- (!) 112 -- 98 %  10/08/19 1312 -- -- -- 91 -- 96 %  10/08/19 1311 -- -- -- 75 11 98 %  10/08/19 1310 -- -- -- 90 -- 98 %  10/08/19 1309 -- -- -- 66 16 96 %  10/08/19 1308 -- -- -- 69 15 97 %  10/08/19 1307 -- -- -- 68 19 97 %  10/08/19 1306 -- -- -- 67 17 96 %  10/08/19 1305 -- -- --  65 18 97 %  10/08/19 1304 -- -- -- 64 17 96 %  10/08/19 1303 -- -- -- 65 15 95 %  10/08/19 1302 123/70 -- -- 66 20 96 %  10/08/19 1301 -- -- -- 66 13 99 %  10/08/19 1300 -- -- -- 65 20 97 %  10/08/19 1259 -- -- -- 65 19 99 %  10/08/19 1258 -- -- -- 63 18 97 %  10/08/19 1257 -- -- -- 65 (!) 21 98 %  10/08/19 1256 -- -- -- 62 18 97 %  10/08/19 1255 -- -- -- 63 16 97 %  10/08/19 1254 -- -- -- 65 20 96 %  10/08/19 1253 -- -- -- 65 20 96 %  10/08/19 1252 -- -- -- 62 18 97 %  10/08/19 1251 -- -- -- 63 20 96 %  10/08/19 1250 -- -- -- 64 18 97 %  10/08/19 1249 -- -- -- 64 18 97 %  10/08/19 1248 -- -- -- 64 18 96 %  10/08/19 1247 119/68 -- -- 63 18 95 %  10/08/19 1246 -- -- -- 63 18 96 %  10/08/19 1245 -- -- -- 64 20 95 %  10/08/19 1244 -- -- -- 63 20 96 %  10/08/19 1243 -- -- -- 60 18 96 %  10/08/19 1242 -- -- -- 83 19 97 %  10/08/19 1241 135/68 -- -- 64 17 98 %  10/08/19 1240 -- -- -- 68 14 95 %  10/08/19 1239 -- -- -- 72 15 95 %  10/08/19 1231 -- -- -- 81 -- 95 %  10/08/19 1221 -- -- -- 97 -- 97 %  10/08/19 1211 -- -- -- 70 14 99 %  10/08/19 1201 128/88 -- -- 65 20 97 %  10/08/19 1151 -- -- -- 64 (!) 9 98 %  10/08/19 1141 -- -- -- 71 13 98 %    General:  well-nourished in no acute distress.   Eyes:  no scleral icterus.   ENT:  There were no oropharyngeal lesions.   Neck was without thyromegaly.   Lymphatics:  Negative cervical, supraclavicular or axillary adenopathy.   Respiratory: lungs were clear bilaterally without wheezing or crackles.    Cardiovascular:  Regular rate and rhythm, S1/S2, without murmur, rub or gallop.  There was no pedal edema.   GI:  abdomen was soft, flat, nontender, nondistended, without organomegaly.   Musculoskeletal: Strength symmetrical in the upper and lower extremities. Skin exam was without echymosis, petichae.   Neuro exam was nonfocal. Patient was alert and oriented.  Attention was good.   Language was appropriate.  Mood was normal without depression.  Speech was not pressured.  Thought content was not tangential.     Lab Results  Component Value Date   WBC 8.8 10/09/2019   HGB 12.0 (L) 10/09/2019   HCT 37.0 (L) 10/09/2019   PLT 225 10/09/2019   GLUCOSE 155 (H) 10/09/2019   CHOL 78 (L) 08/28/2019   TRIG 87 08/28/2019   HDL 28 (L) 08/28/2019   LDLCALC 32 08/28/2019   ALT 16 10/07/2019   AST 16 10/07/2019   NA 140 10/09/2019   K 3.2 (L) 10/09/2019   CL 109 10/09/2019   CREATININE 0.94 10/09/2019   BUN 10 10/09/2019   CO2 22 10/09/2019    CT ANGIO HEAD W OR WO CONTRAST  Result Date: 10/09/2019 CLINICAL DATA:  Stroke follow-up EXAM: CT ANGIOGRAPHY HEAD AND NECK TECHNIQUE: Multidetector CT imaging of the head and  neck was performed using the standard protocol during bolus administration of intravenous contrast. Multiplanar CT image reconstructions and MIPs were obtained to evaluate the vascular anatomy. Carotid stenosis measurements (when applicable) are obtained utilizing NASCET criteria, using the distal internal carotid diameter as the denominator. CONTRAST:  18m OMNIPAQUE IOHEXOL 350 MG/ML SOLN COMPARISON:  None. FINDINGS: CTA NECK FINDINGS SKELETON: There is no bony spinal canal stenosis. No lytic or blastic lesion. OTHER NECK: Normal pharynx, larynx and major salivary glands. No cervical lymphadenopathy. Unremarkable thyroid gland. UPPER CHEST: No pneumothorax or pleural effusion. No nodules or masses. AORTIC ARCH: There is no calcific atherosclerosis of the aortic arch. There is no  aneurysm, dissection or hemodynamically significant stenosis of the visualized portion of the aorta. Conventional 3 vessel aortic branching pattern. The visualized proximal subclavian arteries are widely patent. RIGHT CAROTID SYSTEM: Normal without aneurysm, dissection or stenosis. LEFT CAROTID SYSTEM: Normal without aneurysm, dissection or stenosis. VERTEBRAL ARTERIES: Left dominant configuration. Both origins are clearly patent. There is no dissection, occlusion or flow-limiting stenosis to the skull base (V1-V3 segments). CTA HEAD FINDINGS POSTERIOR CIRCULATION: --Vertebral arteries: Normal V4 segments. --Inferior cerebellar arteries: Normal. --Basilar artery: Normal. --Superior cerebellar arteries: Normal. --Posterior cerebral arteries (PCA): Normal. ANTERIOR CIRCULATION: --Intracranial internal carotid arteries: Normal. --Anterior cerebral arteries (ACA): Normal. Both A1 segments are present. Patent anterior communicating artery (a-comm). --Middle cerebral arteries (MCA): Normal. VENOUS SINUSES: As permitted by contrast timing, patent. ANATOMIC VARIANTS: None Other: There is hypoattenuation within the right PCA territory as demonstrated on earlier head CT and confirmed with MRI. No progression of suspected petechial hemorrhage. Review of the MIP images confirms the above findings. IMPRESSION: 1. Normal intracranial and cervical arteries. The right PCA is patent. 2. Unchanged appearance of right PCA territory ischemia with subacute to chronic blood products. Electronically Signed   By: KUlyses JarredM.D.   On: 10/09/2019 02:43   CT HEAD WO CONTRAST  Result Date: 10/08/2019 CLINICAL DATA:  Dizziness. EXAM: CT HEAD WITHOUT CONTRAST TECHNIQUE: Contiguous axial images were obtained from the base of the skull through the vertex without intravenous contrast. COMPARISON:  None. FINDINGS: Brain: There is abnormal mixed attenuation in the right PCA territory worrisome for infarct with associated petechial hemorrhage.  The brain otherwise appears normal. No midline shift or hydrocephalus. Vascular: Atherosclerosis.  No hyperdense vessel is identified. Skull: Intact.  No focal lesion. Sinuses/Orbits: Negative Other: . none. 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 Brain MRI with and without contrast is recommended for confirmation. Electronically Signed   By: TInge RiseM.D.   On: 10/08/2019 15:05   CT ANGIO NECK W OR WO CONTRAST  Result Date: 10/09/2019 CLINICAL DATA:  Stroke follow-up EXAM: CT ANGIOGRAPHY HEAD AND NECK TECHNIQUE: Multidetector CT imaging of the head and neck was performed using the standard protocol during bolus administration of intravenous contrast. Multiplanar CT image reconstructions and MIPs were obtained to evaluate the vascular anatomy. Carotid stenosis measurements (when applicable) are obtained utilizing NASCET criteria, using the distal internal carotid diameter as the denominator. CONTRAST:  1095mOMNIPAQUE IOHEXOL 350 MG/ML SOLN COMPARISON:  None. FINDINGS: CTA NECK FINDINGS SKELETON: There is no bony spinal canal stenosis. No lytic or blastic lesion. OTHER NECK: Normal pharynx, larynx and major salivary glands. No cervical lymphadenopathy. Unremarkable thyroid gland. UPPER CHEST: No pneumothorax or pleural effusion. No nodules or masses. AORTIC ARCH: There is no calcific atherosclerosis of the aortic arch. There is no aneurysm, dissection or hemodynamically significant stenosis of  the visualized portion of the aorta. Conventional 3 vessel aortic branching pattern. The visualized proximal subclavian arteries are widely patent. RIGHT CAROTID SYSTEM: Normal without aneurysm, dissection or stenosis. LEFT CAROTID SYSTEM: Normal without aneurysm, dissection or stenosis. VERTEBRAL ARTERIES: Left dominant configuration. Both origins are clearly patent. There is no dissection, occlusion or flow-limiting stenosis to the skull base  (V1-V3 segments). CTA HEAD FINDINGS POSTERIOR CIRCULATION: --Vertebral arteries: Normal V4 segments. --Inferior cerebellar arteries: Normal. --Basilar artery: Normal. --Superior cerebellar arteries: Normal. --Posterior cerebral arteries (PCA): Normal. ANTERIOR CIRCULATION: --Intracranial internal carotid arteries: Normal. --Anterior cerebral arteries (ACA): Normal. Both A1 segments are present. Patent anterior communicating artery (a-comm). --Middle cerebral arteries (MCA): Normal. VENOUS SINUSES: As permitted by contrast timing, patent. ANATOMIC VARIANTS: None Other: There is hypoattenuation within the right PCA territory as demonstrated on earlier head CT and confirmed with MRI. No progression of suspected petechial hemorrhage. Review of the MIP images confirms the above findings. IMPRESSION: 1. Normal intracranial and cervical arteries. The right PCA is patent. 2. Unchanged appearance of right PCA territory ischemia with subacute to chronic blood products. Electronically Signed   By: Ulyses Jarred M.D.   On: 10/09/2019 02:43   MR BRAIN WO CONTRAST  Result Date: 10/08/2019 CLINICAL DATA:  Left leg weakness with difficulty walking. Dizziness. EXAM: MRI HEAD WITHOUT CONTRAST TECHNIQUE: Multiplanar, multiecho pulse sequences of the brain and surrounding structures were obtained without intravenous contrast. COMPARISON:  Head CT 10/08/2019 FINDINGS: Brain: There is abnormal diffusion restriction within the medial right temporal lobe and at the dorsolateral right thalamus and posterior globus pallidus, adjacent to the posterior limb of the internal capsule. There is intraparenchymal blood within the posteromedial right temporal lobe. No midline shift or other mass effect. Normal white matter signal. Normal volume of CSF spaces. No chronic microhemorrhage. Normal midline structures. Vascular: Normal flow voids. Skull and upper cervical spine: Normal marrow signal. Sinuses/Orbits: Negative. Other: None. IMPRESSION:  1. Acute infarct of the right PCA territory, which affects the posterior limb of the right internal capsule, in keeping with reported left lower extremity weakness. 2. Petechial blood within the posteromedial right temporal lobe. No mass effect. Heidelberg classification 1b: HI2, confluent petechiae, no mass effect. Electronically Signed   By: Ulyses Jarred M.D.   On: 10/08/2019 20:58   DG Chest Portable 1 View  Result Date: 10/08/2019 CLINICAL DATA:  complaints of Afib RVR with HR of 150 bpm. Pt received 10 mg of cardizeim and 900 ml normal saline fluid prior to arrival. HR now at 64 bpm. C/O dizziness.afib EXAM: PORTABLE CHEST 1 VIEW COMPARISON:  None. FINDINGS: Normal mediastinum and cardiac silhouette. Normal pulmonary vasculature. No evidence of effusion, infiltrate, or pneumothorax. No acute bony abnormality. IMPRESSION: No acute cardiopulmonary process. Electronically Signed   By: Suzy Bouchard M.D.   On: 10/08/2019 12:23     CT ANGIO HEAD W OR WO CONTRAST  Result Date: 10/09/2019 CLINICAL DATA:  Stroke follow-up EXAM: CT ANGIOGRAPHY HEAD AND NECK TECHNIQUE: Multidetector CT imaging of the head and neck was performed using the standard protocol during bolus administration of intravenous contrast. Multiplanar CT image reconstructions and MIPs were obtained to evaluate the vascular anatomy. Carotid stenosis measurements (when applicable) are obtained utilizing NASCET criteria, using the distal internal carotid diameter as the denominator. CONTRAST:  122m OMNIPAQUE IOHEXOL 350 MG/ML SOLN COMPARISON:  None. FINDINGS: CTA NECK FINDINGS SKELETON: There is no bony spinal canal stenosis. No lytic or blastic lesion. OTHER NECK: Normal pharynx, larynx and major salivary glands. No cervical  lymphadenopathy. Unremarkable thyroid gland. UPPER CHEST: No pneumothorax or pleural effusion. No nodules or masses. AORTIC ARCH: There is no calcific atherosclerosis of the aortic arch. There is no aneurysm, dissection  or hemodynamically significant stenosis of the visualized portion of the aorta. Conventional 3 vessel aortic branching pattern. The visualized proximal subclavian arteries are widely patent. RIGHT CAROTID SYSTEM: Normal without aneurysm, dissection or stenosis. LEFT CAROTID SYSTEM: Normal without aneurysm, dissection or stenosis. VERTEBRAL ARTERIES: Left dominant configuration. Both origins are clearly patent. There is no dissection, occlusion or flow-limiting stenosis to the skull base (V1-V3 segments). CTA HEAD FINDINGS POSTERIOR CIRCULATION: --Vertebral arteries: Normal V4 segments. --Inferior cerebellar arteries: Normal. --Basilar artery: Normal. --Superior cerebellar arteries: Normal. --Posterior cerebral arteries (PCA): Normal. ANTERIOR CIRCULATION: --Intracranial internal carotid arteries: Normal. --Anterior cerebral arteries (ACA): Normal. Both A1 segments are present. Patent anterior communicating artery (a-comm). --Middle cerebral arteries (MCA): Normal. VENOUS SINUSES: As permitted by contrast timing, patent. ANATOMIC VARIANTS: None Other: There is hypoattenuation within the right PCA territory as demonstrated on earlier head CT and confirmed with MRI. No progression of suspected petechial hemorrhage. Review of the MIP images confirms the above findings. IMPRESSION: 1. Normal intracranial and cervical arteries. The right PCA is patent. 2. Unchanged appearance of right PCA territory ischemia with subacute to chronic blood products. Electronically Signed   By: Ulyses Jarred M.D.   On: 10/09/2019 02:43   CT HEAD WO CONTRAST  Result Date: 10/08/2019 CLINICAL DATA:  Dizziness. EXAM: CT HEAD WITHOUT CONTRAST TECHNIQUE: Contiguous axial images were obtained from the base of the skull through the vertex without intravenous contrast. COMPARISON:  None. FINDINGS: Brain: There is abnormal mixed attenuation in the right PCA territory worrisome for infarct with associated petechial hemorrhage. The brain otherwise  appears normal. No midline shift or hydrocephalus. Vascular: Atherosclerosis.  No hyperdense vessel is identified. Skull: Intact.  No focal lesion. Sinuses/Orbits: Negative Other: . none. 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 Brain MRI with and without contrast is recommended for confirmation. Electronically Signed   By: Inge Rise M.D.   On: 10/08/2019 15:05   CT ANGIO NECK W OR WO CONTRAST  Result Date: 10/09/2019 CLINICAL DATA:  Stroke follow-up EXAM: CT ANGIOGRAPHY HEAD AND NECK TECHNIQUE: Multidetector CT imaging of the head and neck was performed using the standard protocol during bolus administration of intravenous contrast. Multiplanar CT image reconstructions and MIPs were obtained to evaluate the vascular anatomy. Carotid stenosis measurements (when applicable) are obtained utilizing NASCET criteria, using the distal internal carotid diameter as the denominator. CONTRAST:  172m OMNIPAQUE IOHEXOL 350 MG/ML SOLN COMPARISON:  None. FINDINGS: CTA NECK FINDINGS SKELETON: There is no bony spinal canal stenosis. No lytic or blastic lesion. OTHER NECK: Normal pharynx, larynx and major salivary glands. No cervical lymphadenopathy. Unremarkable thyroid gland. UPPER CHEST: No pneumothorax or pleural effusion. No nodules or masses. AORTIC ARCH: There is no calcific atherosclerosis of the aortic arch. There is no aneurysm, dissection or hemodynamically significant stenosis of the visualized portion of the aorta. Conventional 3 vessel aortic branching pattern. The visualized proximal subclavian arteries are widely patent. RIGHT CAROTID SYSTEM: Normal without aneurysm, dissection or stenosis. LEFT CAROTID SYSTEM: Normal without aneurysm, dissection or stenosis. VERTEBRAL ARTERIES: Left dominant configuration. Both origins are clearly patent. There is no dissection, occlusion or flow-limiting stenosis to the skull base (V1-V3 segments). CTA  HEAD FINDINGS POSTERIOR CIRCULATION: --Vertebral arteries: Normal V4 segments. --Inferior cerebellar arteries: Normal. --Basilar artery: Normal. --Superior cerebellar arteries: Normal. --  Posterior cerebral arteries (PCA): Normal. ANTERIOR CIRCULATION: --Intracranial internal carotid arteries: Normal. --Anterior cerebral arteries (ACA): Normal. Both A1 segments are present. Patent anterior communicating artery (a-comm). --Middle cerebral arteries (MCA): Normal. VENOUS SINUSES: As permitted by contrast timing, patent. ANATOMIC VARIANTS: None Other: There is hypoattenuation within the right PCA territory as demonstrated on earlier head CT and confirmed with MRI. No progression of suspected petechial hemorrhage. Review of the MIP images confirms the above findings. IMPRESSION: 1. Normal intracranial and cervical arteries. The right PCA is patent. 2. Unchanged appearance of right PCA territory ischemia with subacute to chronic blood products. Electronically Signed   By: Ulyses Jarred M.D.   On: 10/09/2019 02:43   MR BRAIN WO CONTRAST  Result Date: 10/08/2019 CLINICAL DATA:  Left leg weakness with difficulty walking. Dizziness. EXAM: MRI HEAD WITHOUT CONTRAST TECHNIQUE: Multiplanar, multiecho pulse sequences of the brain and surrounding structures were obtained without intravenous contrast. COMPARISON:  Head CT 10/08/2019 FINDINGS: Brain: There is abnormal diffusion restriction within the medial right temporal lobe and at the dorsolateral right thalamus and posterior globus pallidus, adjacent to the posterior limb of the internal capsule. There is intraparenchymal blood within the posteromedial right temporal lobe. No midline shift or other mass effect. Normal white matter signal. Normal volume of CSF spaces. No chronic microhemorrhage. Normal midline structures. Vascular: Normal flow voids. Skull and upper cervical spine: Normal marrow signal. Sinuses/Orbits: Negative. Other: None. IMPRESSION: 1. Acute infarct of the  right PCA territory, which affects the posterior limb of the right internal capsule, in keeping with reported left lower extremity weakness. 2. Petechial blood within the posteromedial right temporal lobe. No mass effect. Heidelberg classification 1b: HI2, confluent petechiae, no mass effect. Electronically Signed   By: Ulyses Jarred M.D.   On: 10/08/2019 20:58   DG Chest Portable 1 View  Result Date: 10/08/2019 CLINICAL DATA:  complaints of Afib RVR with HR of 150 bpm. Pt received 10 mg of cardizeim and 900 ml normal saline fluid prior to arrival. HR now at 64 bpm. C/O dizziness.afib EXAM: PORTABLE CHEST 1 VIEW COMPARISON:  None. FINDINGS: Normal mediastinum and cardiac silhouette. Normal pulmonary vasculature. No evidence of effusion, infiltrate, or pneumothorax. No acute bony abnormality. IMPRESSION: No acute cardiopulmonary process. Electronically Signed   By: Suzy Bouchard M.D.   On: 10/08/2019 12:23    Pathology:  SURGICAL PATHOLOGY  CASE: [id number]  PATIENT: Tish Men  Surgical Pathology Report   Clinical History: Rectal bleeding (crm)   FINAL MICROSCOPIC DIAGNOSIS:   A. COLON, DESCENDING, POLYPECTOMY:  - Hyperplastic polyp   B. RECTUM, BIOPSY:  - Adenocarcinoma. See comment   C. RECTUM, POLYPECTOMY:  - Hyperplastic polyp   COMMENT:   B.  Dr. Saralyn Pilar reviewed the case and concurs with the diagnosis.  Immunohistochemical stains for MMR-related proteins are pending and will  be reported in an addendum. Dr. Bryan Lemma was notified on 10/08/2019.   GROSS DESCRIPTION:   A: Received in formalin is a tan, soft tissue fragment that is submitted  in toto. Size: 0.3 cm, 1 block submitted.   B: Received in formalin are tan, soft tissue fragments that are  submitted in toto. Number: Multiple size: 0.1-0.3 blocks: 1   C: Received in formalin is a tan, soft tissue fragment that is submitted  in toto. Size: 0.3 cm, 1 block submitted. (GRP 10/07/2019)    Final  Diagnosis performed by Jaquita Folds, MD.  Electronically  signed 10/08/2019   Assessment and Plan:  1.  Colorectal adenocarcinoma 2.  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  Paroxysmal atrial fibrillation 4.  Coronary artery disease-STEMI 03/2019 and NSTEMI 05/2018 with VT 5.  Diabetes mellitus 6.  Anxiety 7.  Hypertension 8.  Hyperlipidemia  -Biopsy results were discussed with the patient and his family members.  Discussed that we need additional information including a CT of the chest, abdomen, and pelvis for initial staging purposes.  Treatment recommendations will be made based on staging work-up. -Obtain baseline CEA. -Ongoing management of stroke per neurology. -Management of other chronic medical conditions per hospitalist.  Thank you for this referral.   Mikey Bussing, DNP, AGPCNP-BC, AOCNP  ADDENDUM: I saw and examined Mr. Bara.  He is very nice.  His wife was with him.  I agree with the above assessment.  The key factor now is the stage of the rectal cancer.  It is an adenocarcinoma.  We will await the molecular markers.  I really do not think that this has anything to do with his CVA.  I believe that the paroxysmal atrial fibrillation, heart disease, diabetes, hypertension, and hyperlipidemia are the primary factors for the CVA.  I would like to believe that he would have stage II or III rectal cancer.  It looks like a fairly low cancer in the rectum.  As such, I am unsure if he is going to have to have a colostomy when all is said and done.  If his scans look okay without any evidence of metastatic disease, he would be a candidate for neoadjuvant chemotherapy and radiation therapy and followed that by resection.  We will see what his CEA level is.  I would not get surgery involved yet until we know the clinical stage of his malignancy.  I am not sure if he could have an endoscopic ultrasound for perirectal lymph node  evaluation.  Maybe, the MRI will be able to give [place] some information about this.  He will be going to the Rehab Unit.  After this, it sounds like they will be going to the beach as they have had a family beach trip planned.  I would think that we could probably get treatment started after he gets back from the beach trip.  Again a lot will depend on the staging scans/MRI.  He does seem to be in good shape overall.  He would certainly be able to tolerate aggressive therapy.  His wife is a Marine scientist.  She is very eloquent.  I think her for her service to patients and all the hard work that she put in over 30 years.  We will follow along.  Again, we really cannot say much until we get the staging scans done.  I spent about an hour with Mr. Bayona and his wife.  We talked about the possible options for therapy and talked about the potential for cure.  He understands this.  He is willing to do what is necessary.  I know that he is getting fantastic care from all the staff on [address].  Lattie Haw, MD  Romans 12:12

## 2019-10-09 NOTE — Evaluation (Signed)
Occupational Therapy Evaluation Patient Details Name: John Douglas MRN: [id number] DOB: 10-Jan-1956 Today's Date: 10/09/2019    History of Present Illness John Douglas is a 64 y.o. male with medical history significant of coronary artery disease status post stents, paroxysmal A. fib, hypertension, diabetes, hyperlipidemia, marijuana abuse, tobacco abuse, obesity, recent GI bleeding status post colonoscopy on 10/07/2019 presents to emergency department due to left leg weakness and difficulty walking. MRI positive for R PCA infarct.   Clinical Impression   PTA John Douglas driving, working, living alone, and independent in all ADLs. John Douglas is limited by problem list below. John Douglas requires supervision performing ADLs in sitting, but min A while performing ADLs in standing for impairments in balance and decreased safety awareness. While performing grooming at sink, John Douglas required verbal cues to attend to task, sequence, and problem solving to complete task. John Douglas presents with L visual field deficits and requires cues to look to the left during functional activities. John Douglas's daughters available at eval to verify John Douglas information. Believe John Douglas would benefit from skilled OT services acutely and at the CIR level to get back to PLOF and increase safety.     Follow Up Recommendations  CIR    Equipment Recommendations   (TBD at next venue of care)    Recommendations for Other Services Rehab consult     Precautions / Restrictions Precautions Precautions: Fall Restrictions Weight Bearing Restrictions: No      Mobility Bed Mobility Overal bed mobility: Needs Assistance Bed Mobility: Sit to Supine     Supine to sit: Min assist Sit to supine: Supervision   General bed mobility comments: supervision for safety  Transfers Overall transfer level: Needs assistance Equipment used: Rolling walker (2 wheeled) Transfers: Sit to/from Stand Sit to Stand: Min assist         General transfer comment: John Douglas unsafe with  walker reuires verbal cues for safety and min A to steady    Balance Overall balance assessment: Needs assistance Sitting-balance support: Feet supported Sitting balance-Leahy Scale: Good Sitting balance - Comments: no assist needed for sitting balance   Standing balance support: Bilateral upper extremity supported;During functional activity Standing balance-Leahy Scale: Fair Standing balance comment: reliant and walker and therapist to steady due to lean with fatigue. Relies on external supports at sink                           ADL either performed or assessed with clinical judgement   ADL Overall ADL's : Needs assistance/impaired     Grooming: Oral care;Minimal assistance;Mod assistance;With adaptive equipment;Standing Grooming Details (indicate cue type and reason): Min A-Mod A for steadying at sink while standing. Assistance increased with fatigue. Needed verbal cues for sequencing, attention, and problem solving.  Upper Body Bathing: Supervision/ safety;Sitting Upper Body Bathing Details (indicate cue type and reason): supervision for safety Lower Body Bathing: Supervison/ safety;Sitting/lateral leans Lower Body Bathing Details (indicate cue type and reason): supervision for safety Upper Body Dressing : Supervision/safety;Sitting Upper Body Dressing Details (indicate cue type and reason): supervision for safety Lower Body Dressing: Minimal assistance;Sit to/from stand Lower Body Dressing Details (indicate cue type and reason): Min A to help with balance Toilet Transfer: Minimal assistance;Ambulation;Regular Glass blower/designer Details (indicate cue type and reason): Min A to steady upon standing Toileting- Clothing Manipulation and Hygiene: Minimal assistance Toileting - Clothing Manipulation Details (indicate cue type and reason): Min A to steady   Tub/Shower Transfer Details (indicate cue type and reason): deferred assess  next session Functional mobility during  ADLs: Minimal assistance;Rolling walker;Cueing for safety General ADL Comments: John Douglas requires supervision when seated. John Douglas has decreased balance and is impoulsive and unsafe with walker. Needs verbal cues and min-mod A to complete ADLs in standing.      Vision Patient Visual Report: Peripheral vision impairment Vision Assessment?: Yes Eye Alignment: Within Functional Limits Alignment/Gaze Preference: Within Defined Limits Tracking/Visual Pursuits: Able to track stimulus in all quads without difficulty Visual Fields: Left visual field deficit Additional Comments: John Douglas presents with L visual field deficits. Required verbal cues to look to left side during functional activities at sink.      Perception     Praxis      Pertinent Vitals/Pain Pain Assessment: Faces Faces Pain Scale: Hurts a little bit Pain Location: back and B upper legs Pain Descriptors / Indicators: Discomfort Pain Intervention(s): Monitored during session     Hand Dominance Right (John Douglas can use both)   Extremity/Trunk Assessment Upper Extremity Assessment Upper Extremity Assessment: LUE deficits/detail LUE Deficits / Details: No deficits in strength. decreased coordination noted in finger to nose.  LUE Sensation: WNL LUE Coordination: decreased fine motor;decreased gross motor   Lower Extremity Assessment Lower Extremity Assessment: Defer to John Douglas evaluation LLE Sensation: decreased light touch LLE Coordination: decreased gross motor   Cervical / Trunk Assessment Cervical / Trunk Assessment: Normal   Communication Communication Communication: No difficulties   Cognition Arousal/Alertness: Awake/alert Behavior During Therapy: WFL for tasks assessed/performed;Impulsive Overall Cognitive Status: Impaired/Different from baseline Area of Impairment: Attention;Following commands;Safety/judgement;Problem solving                   Current Attention Level: Selective   Following Commands: Follows one step  commands consistently;Follows multi-step commands inconsistently Safety/Judgement: Decreased awareness of deficits;Decreased awareness of safety   Problem Solving: Requires verbal cues General Comments: John Douglas impulsive with walker and had decreased awareness of safety when ambulating   General Comments  John Douglas's daughters available during eval and verified information about John Douglas.   Exercises     Shoulder Instructions      Home Living Family/patient expects to be discharged to:: Private residence Living Arrangements: Alone Available Help at Discharge: Family;Available PRN/intermittently Type of Home: House Home Access: Stairs to enter [employer] of Steps: 1 Entrance Stairs-Rails: None Home Layout: One level     Bathroom Shower/Tub: Teacher, early years/pre: Standard     Home Equipment: None   Additional Comments: family Douglas availability to cane and wc if needed  Lives With: Alone    Prior Functioning/Environment Level of Independence: Independent        Comments: John Douglas working, driving, and independence in all ADLs        OT Problem List: Decreased activity tolerance;Impaired balance (sitting and/or standing);Impaired vision/perception;Decreased coordination;Decreased safety awareness;Decreased knowledge of use of DME or AE;Decreased knowledge of precautions;Pain;Decreased cognition      OT Treatment/Interventions: Self-care/ADL training;Therapeutic exercise;Energy conservation;DME and/or AE instruction;Therapeutic activities;Visual/perceptual remediation/compensation;Patient/family education;Balance training    OT Goals(Current goals can be found in the care plan section) Acute Rehab OT Goals Patient Stated Goal: to return to independent OT Goal Formulation: With patient/family Time For Goal Achievement: 10/23/19 Potential to Achieve Goals: Good ADL Goals John Douglas Will Perform Grooming: standing;with min guard assist John Douglas Will Perform Lower Body  Dressing: sit to/from stand;with supervision John Douglas Will Transfer to Toilet: ambulating;regular height toilet;with min guard assist John Douglas Will Perform Tub/Shower Transfer: with min guard assist;ambulating John Douglas/caregiver will Perform Home Exercise Program: Increased ROM;With  theraputty;With theraband;With written HEP provided;Left upper extremity  OT Frequency: Min 2X/week   Barriers to D/C:            Co-evaluation              AM-PAC OT "6 Clicks" Daily Activity     Outcome Measure Help from another person eating meals?: None Help from another person taking care of personal grooming?: A Little Help from another person toileting, which includes using toliet, bedpan, or urinal?: A Little Help from another person bathing (including washing, rinsing, drying)?: A Little Help from another person to put on and taking off regular upper body clothing?: A Little Help from another person to put on and taking off regular lower body clothing?: A Little 6 Click Score: 19   End of Session Equipment Utilized During Treatment: Gait belt;Rolling walker Nurse Communication: Mobility status  Activity Tolerance: Patient tolerated treatment well Patient left: in bed;with call bell/phone within reach;with bed alarm set;with family/visitor present (daughters present)  OT Visit Diagnosis: Unsteadiness on feet (R26.81);Muscle weakness (generalized) (M62.81);Other symptoms and signs involving the nervous system (R29.898);Pain Pain - Right/Left:  (bilateral) Pain - part of body: Leg (low back and upper legs)                Time: 2725-3664 OT Time Calculation (min): 29 min Charges:  OT General Charges $OT Visit: 1 Visit OT Evaluation $OT Eval Moderate Complexity: 1 Mod  Kveon Casanas/OTS  Sherle Mello 10/09/2019, 3:37 PM

## 2019-10-09 NOTE — Progress Notes (Signed)
  Echocardiogram 2D Echocardiogram has been performed.  John Douglas 10/09/2019, 9:37 AM

## 2019-10-09 NOTE — Progress Notes (Signed)
STROKE TEAM PROGRESS NOTE   INTERVAL HISTORY His daughter and granddaughter are at the bedside.  He presented with 2 3 days of dizziness following drinking liquid for preparation for colonoscopy.  MRI scan shows right posterior cerebral artery infarct.  CT angiogram showed no significant large vessel intra or extracranial stenosis.  Carotid ultrasound is pending.  Vitals:   10/08/19 2313 10/09/19 0100 10/09/19 0300 10/09/19 0806  BP: 125/60 (!) 158/80 (!) 162/99 (!) 186/97  Pulse: 77 77 77 71  Resp: 18 18 19 18   Temp: 98.5 F (36.9 C) 98.5 F (36.9 C) 98.5 F (36.9 C) 98.2 F (36.8 C)  TempSrc: Oral Oral Oral Oral  SpO2: 98% 98% 98% 97%  Weight:      Height:        CBC:  Recent Labs  Lab 10/07/19 1042 10/07/19 1042 10/08/19 1128 10/09/19 0500  WBC 8.5   < > 12.3* 8.8  NEUTROABS 6.3  --   --  6.3  HGB 12.5*   < > 12.2* 12.0*  HCT 37.9*   < > 38.1* 37.0*  MCV 82.8   < > 85.8 84.7  PLT 252   < > 256 225   < > = values in this interval not displayed.    Basic Metabolic Panel:  Recent Labs  Lab 10/08/19 1128 10/09/19 0500  NA 137 140  K 3.4* 3.2*  CL 106 109  CO2 19* 22  GLUCOSE 194* 155*  BUN 8 10  CREATININE 0.90 0.94  CALCIUM 8.2* 8.6*  MG 1.8  --    Lipid Panel:     Component Value Date/Time   CHOL 78 (L) 08/28/2019 0940   TRIG 87 08/28/2019 0940   HDL 28 (L) 08/28/2019 0940   CHOLHDL 2.8 08/28/2019 0940   CHOLHDL 3.4 06/05/2019 0337   VLDL 20 06/05/2019 0337   LDLCALC 32 08/28/2019 0940   HgbA1c:  Lab Results  Component Value Date   HGBA1C 8.0 (H) 10/09/2019   Urine Drug Screen: No results found for: LABOPIA, COCAINSCRNUR, LABBENZ, AMPHETMU, THCU, LABBARB  Alcohol Level No results found for: ETH  IMAGING past 24 hours CT ANGIO HEAD W OR WO CONTRAST  Result Date: 10/09/2019 CLINICAL DATA:  Stroke follow-up EXAM: CT ANGIOGRAPHY HEAD AND NECK TECHNIQUE: Multidetector CT imaging of the head and neck was performed using the standard protocol during  bolus administration of intravenous contrast. Multiplanar CT image reconstructions and MIPs were obtained to evaluate the vascular anatomy. Carotid stenosis measurements (when applicable) are obtained utilizing NASCET criteria, using the distal internal carotid diameter as the denominator. CONTRAST:  160mL OMNIPAQUE IOHEXOL 350 MG/ML SOLN COMPARISON:  None. FINDINGS: CTA NECK FINDINGS SKELETON: There is no bony spinal canal stenosis. No lytic or blastic lesion. OTHER NECK: Normal pharynx, larynx and major salivary glands. No cervical lymphadenopathy. Unremarkable thyroid gland. UPPER CHEST: No pneumothorax or pleural effusion. No nodules or masses. AORTIC ARCH: There is no calcific atherosclerosis of the aortic arch. There is no aneurysm, dissection or hemodynamically significant stenosis of the visualized portion of the aorta. Conventional 3 vessel aortic branching pattern. The visualized proximal subclavian arteries are widely patent. RIGHT CAROTID SYSTEM: Normal without aneurysm, dissection or stenosis. LEFT CAROTID SYSTEM: Normal without aneurysm, dissection or stenosis. VERTEBRAL ARTERIES: Left dominant configuration. Both origins are clearly patent. There is no dissection, occlusion or flow-limiting stenosis to the skull base (V1-V3 segments). CTA HEAD FINDINGS POSTERIOR CIRCULATION: --Vertebral arteries: Normal V4 segments. --Inferior cerebellar arteries: Normal. --Basilar artery: Normal. --Superior cerebellar  arteries: Normal. --Posterior cerebral arteries (PCA): Normal. ANTERIOR CIRCULATION: --Intracranial internal carotid arteries: Normal. --Anterior cerebral arteries (ACA): Normal. Both A1 segments are present. Patent anterior communicating artery (a-comm). --Middle cerebral arteries (MCA): Normal. VENOUS SINUSES: As permitted by contrast timing, patent. ANATOMIC VARIANTS: None Other: There is hypoattenuation within the right PCA territory as demonstrated on earlier head CT and confirmed with MRI. No  progression of suspected petechial hemorrhage. Review of the MIP images confirms the above findings. IMPRESSION: 1. Normal intracranial and cervical arteries. The right PCA is patent. 2. Unchanged appearance of right PCA territory ischemia with subacute to chronic blood products. Electronically Signed   By: Ulyses Jarred M.D.   On: 10/09/2019 02:43   CT HEAD WO CONTRAST  Result Date: 10/08/2019 CLINICAL DATA:  Dizziness. EXAM: CT HEAD WITHOUT CONTRAST TECHNIQUE: Contiguous axial images were obtained from the base of the skull through the vertex without intravenous contrast. COMPARISON:  None. FINDINGS: Brain: There is abnormal mixed attenuation in the right PCA territory worrisome for infarct with associated petechial hemorrhage. The brain otherwise appears normal. No midline shift or hydrocephalus. Vascular: Atherosclerosis.  No hyperdense vessel is identified. Skull: Intact.  No focal lesion. Sinuses/Orbits: Negative Other: . none. 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 Brain MRI with and without contrast is recommended for confirmation. Electronically Signed   By: Inge Rise M.D.   On: 10/08/2019 15:05   CT ANGIO NECK W OR WO CONTRAST  Result Date: 10/09/2019 CLINICAL DATA:  Stroke follow-up EXAM: CT ANGIOGRAPHY HEAD AND NECK TECHNIQUE: Multidetector CT imaging of the head and neck was performed using the standard protocol during bolus administration of intravenous contrast. Multiplanar CT image reconstructions and MIPs were obtained to evaluate the vascular anatomy. Carotid stenosis measurements (when applicable) are obtained utilizing NASCET criteria, using the distal internal carotid diameter as the denominator. CONTRAST:  163mL OMNIPAQUE IOHEXOL 350 MG/ML SOLN COMPARISON:  None. FINDINGS: CTA NECK FINDINGS SKELETON: There is no bony spinal canal stenosis. No lytic or blastic lesion. OTHER NECK: Normal pharynx, larynx and  major salivary glands. No cervical lymphadenopathy. Unremarkable thyroid gland. UPPER CHEST: No pneumothorax or pleural effusion. No nodules or masses. AORTIC ARCH: There is no calcific atherosclerosis of the aortic arch. There is no aneurysm, dissection or hemodynamically significant stenosis of the visualized portion of the aorta. Conventional 3 vessel aortic branching pattern. The visualized proximal subclavian arteries are widely patent. RIGHT CAROTID SYSTEM: Normal without aneurysm, dissection or stenosis. LEFT CAROTID SYSTEM: Normal without aneurysm, dissection or stenosis. VERTEBRAL ARTERIES: Left dominant configuration. Both origins are clearly patent. There is no dissection, occlusion or flow-limiting stenosis to the skull base (V1-V3 segments). CTA HEAD FINDINGS POSTERIOR CIRCULATION: --Vertebral arteries: Normal V4 segments. --Inferior cerebellar arteries: Normal. --Basilar artery: Normal. --Superior cerebellar arteries: Normal. --Posterior cerebral arteries (PCA): Normal. ANTERIOR CIRCULATION: --Intracranial internal carotid arteries: Normal. --Anterior cerebral arteries (ACA): Normal. Both A1 segments are present. Patent anterior communicating artery (a-comm). --Middle cerebral arteries (MCA): Normal. VENOUS SINUSES: As permitted by contrast timing, patent. ANATOMIC VARIANTS: None Other: There is hypoattenuation within the right PCA territory as demonstrated on earlier head CT and confirmed with MRI. No progression of suspected petechial hemorrhage. Review of the MIP images confirms the above findings. IMPRESSION: 1. Normal intracranial and cervical arteries. The right PCA is patent. 2. Unchanged appearance of right PCA territory ischemia with subacute to chronic blood products. Electronically Signed   By: Ulyses Jarred M.D.   On: 10/09/2019 02:43  MR BRAIN WO CONTRAST  Result Date: 10/08/2019 CLINICAL DATA:  Left leg weakness with difficulty walking. Dizziness. EXAM: MRI HEAD WITHOUT CONTRAST  TECHNIQUE: Multiplanar, multiecho pulse sequences of the brain and surrounding structures were obtained without intravenous contrast. COMPARISON:  Head CT 10/08/2019 FINDINGS: Brain: There is abnormal diffusion restriction within the medial right temporal lobe and at the dorsolateral right thalamus and posterior globus pallidus, adjacent to the posterior limb of the internal capsule. There is intraparenchymal blood within the posteromedial right temporal lobe. No midline shift or other mass effect. Normal white matter signal. Normal volume of CSF spaces. No chronic microhemorrhage. Normal midline structures. Vascular: Normal flow voids. Skull and upper cervical spine: Normal marrow signal. Sinuses/Orbits: Negative. Other: None. IMPRESSION: 1. Acute infarct of the right PCA territory, which affects the posterior limb of the right internal capsule, in keeping with reported left lower extremity weakness. 2. Petechial blood within the posteromedial right temporal lobe. No mass effect. Heidelberg classification 1b: HI2, confluent petechiae, no mass effect. Electronically Signed   By: Ulyses Jarred M.D.   On: 10/08/2019 20:58   DG Chest Portable 1 View  Result Date: 10/08/2019 CLINICAL DATA:  complaints of Afib RVR with HR of 150 bpm. Pt received 10 mg of cardizeim and 900 ml normal saline fluid prior to arrival. HR now at 64 bpm. C/O dizziness.afib EXAM: PORTABLE CHEST 1 VIEW COMPARISON:  None. FINDINGS: Normal mediastinum and cardiac silhouette. Normal pulmonary vasculature. No evidence of effusion, infiltrate, or pneumothorax. No acute bony abnormality. IMPRESSION: No acute cardiopulmonary process. Electronically Signed   By: Suzy Bouchard M.D.   On: 10/08/2019 12:23    PHYSICAL EXAM Pleasant middle-age Caucasian male not in distress. . Afebrile. Head is nontraumatic. Neck is supple without bruit.    Cardiac exam no murmur or gallop. Lungs are clear to auscultation. Distal pulses are well felt. Neurological  Exam ;  Awake  Alert oriented x 3. Normal speech and language.diminished recall 2/3.  Eye movements full without nystagmus.fundi were not visualized. Vision acuity  appears normal.  Partial left homonymous hemianopsia.  Hearing is normal. Palatal movements are normal. Face symmetric. Tongue midline. Normal strength, tone, reflexes and coordination.  Diminished fine finger movements on the left.  Orbits right over left upper extremity.  Normal sensation. Gait deferred.  ASSESSMENT/PLAN [name] is a 64 y.o. 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 Vessels Unremarkable   2D Echo w/ bubble EF 60-65%. No source of embolus. LA dilated. Neg bubble  LDL 32 in April  HgbA1c 8.0  SCDs for VTE prophylaxis  aspirin 81 mg daily and clopidogrel 75 mg daily prior to admission, now on aspirin 81 mg daily. No DAPT given petechial hemorrhage. Likely resume AC w/ eliquis at time of dc if no rectal bleeding  Therapy recommendations:  CIR  Disposition:  pending   Atrial Fibrillation w/ RVR  Home anticoagulation:  none   Off eliquis 1 mo ago due to bloody stools . Currently  on aspirin 81 . Consider resume eliquis at time of d/c if no rectal bleeding   Hypertensive Urgency  SBP on arrival 170s  Remains elevated 170-480s . Permissive hypertension (OK if < 220/120) but gradually normalize in 5-7 days . Long-term BP goal normotensive  Hyperlipidemia  Home meds:   crestor 20 and zetia 10, resumed in hospital  LDL 62 in April, goal < 70  Continue statin at discharge  Diabetes type II Uncontrolled Diabetic Neuropathy  HgbA1c 8.0, goal < 7.0  Other Stroke Risk Factors  Advanced age  Smokeless tobacco use, quit 5 mos ago  ETOH use, advised to drink no more than 2 drink(s) a day  Obesity, Body mass index is 31.57 kg/m., recommend weight loss, diet and exercise as appropriate   Coronary artery disease, NSTEMI 03/2019   Hx SVT  Other Active Problems  Colon adenocarcinoma, new dx on Wed following colonoscopy. Done d/t 57mo bloody stools on eliquis   Hypokalemia     Hospital day # 1  He presented with dizziness and gait ataxia due to right PCA infarct with left-sided peripheral vision loss.  Etiology likely embolic from his atrial fibrillation though hypercoagulability from recently diagnosed colonic adenocarcinoma is also possible.  Continue ongoing stroke work-up.  Recommend anticoagulation with Eliquis when safe after colonoscopy.  Therapy consults.  Recommend statin for elevated LDL.  Patient advised not to drive due to peripheral vision loss and voiced understanding.  Long discussion with patient and daughter and granddaughter and Dr. Dante Gang and answered questions.  Greater than 50% time during this 35-minute visit was spent on counseling and coordination of care about his embolic stroke and visual loss and answering questions.  Stroke team will sign off.  Kindly call for questions.  To contact Stroke Continuity provider, please refer to [email or web address] After hours, contact General Neurology

## 2019-10-10 ENCOUNTER — Inpatient Hospital Stay (HOSPITAL_COMMUNITY): Payer: Medicare HMO

## 2019-10-10 LAB — GLUCOSE, CAPILLARY
Glucose-Capillary: 162 mg/dL — ABNORMAL HIGH (ref 70–99)
Glucose-Capillary: 197 mg/dL — ABNORMAL HIGH (ref 70–99)
Glucose-Capillary: 172 mg/dL — ABNORMAL HIGH (ref 70–99)
Glucose-Capillary: 148 mg/dL — ABNORMAL HIGH (ref 70–99)

## 2019-10-10 MED ORDER — LISINOPRIL 2.5 MG PO TABS
2.5000 mg | ORAL_TABLET | Freq: Every day | ORAL | Status: DC
  Administered 2019-10-10 – 2019-10-15 (×6): 2.5 mg via ORAL
  Filled 2019-10-10 (×6): qty 1

## 2019-10-10 MED ORDER — IOHEXOL 300 MG/ML  SOLN
100.0000 mL | Freq: Once | INTRAMUSCULAR | Status: AC | PRN
  Administered 2019-10-10: 100 mL via INTRAVENOUS

## 2019-10-10 MED ORDER — IOHEXOL 9 MG/ML PO SOLN
ORAL | Status: AC
  Administered 2019-10-10: 500 mL
  Filled 2019-10-10: qty 500

## 2019-10-10 MED ORDER — CLONAZEPAM 0.25 MG PO TBDP
1.0000 mg | ORAL_TABLET | Freq: Two times a day (BID) | ORAL | Status: DC | PRN
  Administered 2019-10-15: 1 mg via ORAL
  Filled 2019-10-10: qty 4

## 2019-10-10 MED ORDER — IOHEXOL 9 MG/ML PO SOLN
500.0000 mL | ORAL | Status: AC
  Administered 2019-10-10: 500 mL via ORAL

## 2019-10-10 MED ORDER — METOPROLOL TARTRATE 50 MG PO TABS
75.0000 mg | ORAL_TABLET | Freq: Two times a day (BID) | ORAL | Status: DC
  Administered 2019-10-10 – 2019-10-15 (×11): 75 mg via ORAL
  Filled 2019-10-10 (×11): qty 1

## 2019-10-10 NOTE — Progress Notes (Signed)
Inpatient Rehab Admissions Coordinator:   Met with patient and daughter at bedside to discuss potential CIR admission. Pt. Stated interest and daughter confirmed family can provide 24/7 support after discharge. Will pursue for potential admit next week, pending insurance approval and bed availability.   Clemens Catholic, [place], [place] Admissions Coordinator  [phone] ([place]) [phone] (office)

## 2019-10-10 NOTE — Progress Notes (Signed)
PROGRESS NOTE    John Douglas  MEQ:[id number] DOB: Jan 06, 1956 DOA: 10/08/2019 PCP: Leonides Sake, MD    Brief Narrative:  64 year old gentleman with extensive cardiovascular problems.  STEMI and PCI to RCA in 29/7989 complicated by sustained ventricular tachycardia .  Aspirin and Brilinta Non-STEMI and subsequent PCI to LAD on 06/2019 along with paroxysmal A. fib.  Aspirin and Plavix.  Anticoagulation deferred because of ongoing GI evaluation.  He was having intermittent hematochezia more than 6 months ago.  He attributed that to Metformin. Never had colonoscopy.  Because of STEMI and LAD stent, waited 3 months of dual antiplatelet therapy to get a colonoscopy.  6/9, Plavix held for 1 week and continued on aspirin, underwent colonoscopy, found to have adenocarcinoma of the colon with partially obstructive lesion. Since the day of colonoscopy or the night before only he was having some weakness, dragging his left foot, dizzy and disoriented.  He reported the symptoms while he was taking bowel prep that was thought to be from bowel prep.  6/10, went back to [facility] ER.  Found to be in A. fib with RVR heart rate 150.  CT head showed right PCA distribution infarct and transferred to [facility]   Assessment & Plan:   Principal Problem:   Ischemic stroke [facility]) Active Problems:   Paroxysmal atrial fibrillation (HCC)   Essential hypertension   Hyperlipidemia LDL goal <70   Coronary artery disease   Ischemic cardiomyopathy   Leukocytosis   Hypokalemia   Colon cancer (HCC)  Right PCA territory ischemic infarct with petechial hemorrhagic conversion: With history of paroxysmal A. Fib. Clinical findings, dizziness, lightheadedness and confusion, left-sided weakness. CT head findings, right PCA territory ischemic infarct. MRI of the brain, acute infarct right PCA territory, posterior limb of internal capsule.  Petechial blood within the posterior medial right temporal lobe.  No  mass-effect. CTA of the head and neck, no large vessel occlusion. 2D echocardiogram,normal. Echocardiogram 06/2019 with ejection fraction 45%. Antiplatelet therapy, aspirin at home continued.  Plavix on hold for the last 7 days, continue to hold because of petechial hemorrhage.  Patient will probably need anticoagulation once work-up completed. LDL 98.  Last LDL 32 on 4/30.  On rosuvastatin and Zetia.  Continue. Hemoglobin A1c 8.  Not on treatment.  Was on Metformin in the past so he must be diagnosed with diabetes. Continue to work with PT OT.  Appreciate neurology input. Anticipate inpatient rehab.  Paroxysmal atrial fibrillation:  Please in sinus rhythm today.  Resumed metoprolol.  Anticoagulation on hold.  Rate controlled.  Coronary artery disease, ST elevation MI 03/2019, non-STEMI 05/2018 with VT:  Currently stable.  Without chest pain. Patient metoprolol and lisinopril.  On aspirin.  Plavix on hold.  Nitroglycerin as needed.  Hematochezia/recently diagnosed adenocarcinoma of the colon:  New diagnosed adenocarcinoma.  Followed by oncology.   Staging CT scans today.    Hypokalemia:  Further replace.   DVT prophylaxis: SCD Code Status: Full code Family Communication: None today. Disposition Plan: Status is: Inpatient  Remains inpatient appropriate because:Ongoing diagnostic testing needed not appropriate for outpatient work up and Inpatient level of care appropriate due to severity of illness   Dispo: The patient is from: Home              Anticipated d/c is to: Inpatient rehab versus home.              Anticipated d/c date is: 2 days  Patient currently is not medically stable to d/c.         Consultants:   Oncology  Neurology  Procedures:   None  Antimicrobials:   None   Subjective: Patient seen and examined.  No overnight events.  Denies any complaints.  Some drift on the left leg otherwise no other weaknesses.  Denies any chest pain or  shortness of breath.  Objective: Vitals:   10/09/19 1924 10/09/19 2315 10/10/19 0306 10/10/19 0815  BP: (!) 166/97 (!) 179/98 (!) 155/101 (!) 174/99  Pulse: 70 73 66 68  Resp: 18 18 18 14   Temp: 97.9 F (36.6 C) 98.3 F (36.8 C) 98.1 F (36.7 C) 98.3 F (36.8 C)  TempSrc: Oral Oral Oral Oral  SpO2: 97% 97% 98% 98%  Weight:      Height:        Intake/Output Summary (Last 24 hours) at 10/10/2019 1001 Last data filed at 10/10/2019 0938 Gross per 24 hour  Intake 150 ml  Output 850 ml  Net -700 ml   Filed Weights   10/08/19 1122  Weight: 99.8 kg    Examination:  Physical Exam HENT:     Head: Normocephalic.  Eyes:     Pupils: Pupils are equal, round, and reactive to light.  Cardiovascular:     Rate and Rhythm: Normal rate and regular rhythm.  Pulmonary:     Breath sounds: Normal breath sounds.  Abdominal:     Palpations: Abdomen is soft.  Neurological:     Mental Status: He is alert and oriented to person, place, and time.     Comments: Cranial nerves no deficit. Left lower extremity 4/5.  Rest is normal.      Data Reviewed: I have personally reviewed following labs and imaging studies  CBC: Recent Labs  Lab 10/07/19 1042 10/08/19 1128 10/09/19 0500  WBC 8.5 12.3* 8.8  NEUTROABS 6.3  --  6.3  HGB 12.5* 12.2* 12.0*  HCT 37.9* 38.1* 37.0*  MCV 82.8 85.8 84.7  PLT 252 256 476   Basic Metabolic Panel: Recent Labs  Lab 10/07/19 1042 10/08/19 1128 10/09/19 0500  NA 141 137 140  K 4.0 3.4* 3.2*  CL 105 106 109  CO2 26 19* 22  GLUCOSE 167* 194* 155*  BUN 16 8 10   CREATININE 0.87 0.90 0.94  CALCIUM 8.9 8.2* 8.6*  MG  --  1.8  --    GFR: Estimated Creatinine Clearance: 92.8 mL/min (by C-G formula based on SCr of 0.94 mg/dL). Liver Function Tests: Recent Labs  Lab 10/07/19 1042  AST 16  ALT 16  ALKPHOS 56  BILITOT 0.8  PROT 6.7  ALBUMIN 3.9   No results for input(s): LIPASE, AMYLASE in the last 168 hours. No results for input(s): AMMONIA  in the last 168 hours. Coagulation Profile: No results for input(s): INR, PROTIME in the last 168 hours. Cardiac Enzymes: No results for input(s): CKTOTAL, CKMB, CKMBINDEX, TROPONINI in the last 168 hours. BNP (last 3 results) No results for input(s): PROBNP in the last 8760 hours. HbA1C: Recent Labs    10/09/19 0500  HGBA1C 8.0*   CBG: Recent Labs  Lab 10/09/19 1211 10/09/19 1615 10/09/19 1840 10/09/19 2105 10/10/19 0602  GLUCAP 148* 148* 277* 154* 162*   Lipid Profile: Recent Labs    10/09/19 0500  CHOL 148  HDL 26*  LDLCALC 98  TRIG 119  CHOLHDL 5.7   Thyroid Function Tests: No results for input(s): TSH, T4TOTAL, FREET4, T3FREE, THYROIDAB in the  last 72 hours. Anemia Panel: No results for input(s): VITAMINB12, FOLATE, FERRITIN, TIBC, IRON, RETICCTPCT in the last 72 hours. Sepsis Labs: No results for input(s): PROCALCITON, LATICACIDVEN in the last 168 hours.  Recent Results (from the past 240 hour(s))  SARS CORONAVIRUS 2 (TAT 6-24 HRS) Nasopharyngeal Nasopharyngeal Swab     Status: None   Collection Time: 10/03/19  1:23 PM   Specimen: Nasopharyngeal Swab  Result Value Ref Range Status   SARS Coronavirus 2 NEGATIVE NEGATIVE Final    Comment: (NOTE) SARS-CoV-2 target nucleic acids are NOT DETECTED. The SARS-CoV-2 RNA is generally detectable in upper and lower respiratory specimens during the acute phase of infection. Negative results do not preclude SARS-CoV-2 infection, do not rule out co-infections with other pathogens, and should not be used as the sole basis for treatment or other patient management decisions. Negative results must be combined with clinical observations, patient history, and epidemiological information. The expected result is Negative. Fact Sheet for Patients: SugarRoll.be Fact Sheet for Healthcare Providers: https://www.woods-mathews.com/ This test is not yet approved or cleared by the [place] FDA and  has been authorized for detection and/or diagnosis of SARS-CoV-2 by FDA under an Emergency Use Authorization (EUA). This EUA will remain  in effect (meaning this test can be used) for the duration of the COVID-19 declaration under Section 56 4(b)(1) of the Act, 21 U.S.C. section 360bbb-3(b)(1), unless the authorization is terminated or revoked sooner. Performed at [facility], [place] [address], [place], [place] [address]   SARS Coronavirus 2 by RT PCR (hospital order, performed in [facility] hospital lab) Nasopharyngeal Nasopharyngeal Swab     Status: None   Collection Time: 10/08/19  5:40 PM   Specimen: Nasopharyngeal Swab  Result Value Ref Range Status   SARS Coronavirus 2 NEGATIVE NEGATIVE Final    Comment: (NOTE) SARS-CoV-2 target nucleic acids are NOT DETECTED.  The SARS-CoV-2 RNA is generally detectable in upper and lower respiratory specimens during the acute phase of infection. The lowest concentration of SARS-CoV-2 viral copies this assay can detect is 250 copies / mL. A negative result does not preclude SARS-CoV-2 infection and should not be used as the sole basis for treatment or other patient management decisions.  A negative result may occur with improper specimen collection / handling, submission of specimen other than nasopharyngeal swab, presence of viral mutation(s) within the areas targeted by this assay, and inadequate number of viral copies (<250 copies / mL). A negative result must be combined with clinical observations, patient history, and epidemiological information.  Fact Sheet for Patients:   StrictlyIdeas.no  Fact Sheet for Healthcare Providers: BankingDealers.co.za  This test is not yet approved or  cleared by the [place] FDA and has been authorized for detection and/or diagnosis of SARS-CoV-2 by FDA under an Emergency Use Authorization (EUA).  This EUA will remain in effect  (meaning this test can be used) for the duration of the COVID-19 declaration under Section 564(b)(1) of the Act, 21 U.S.C. section 360bbb-3(b)(1), unless the authorization is terminated or revoked sooner.  Performed at [facility], [place] [address] [place], [place] [address]          Radiology Studies: CT ANGIO HEAD W OR WO CONTRAST  Result Date: 10/09/2019 CLINICAL DATA:  Stroke follow-up EXAM: CT ANGIOGRAPHY HEAD AND NECK TECHNIQUE: Multidetector CT imaging of the head and neck was performed using the standard protocol during bolus administration of intravenous contrast. Multiplanar CT image reconstructions and MIPs were obtained to  evaluate the vascular anatomy. Carotid stenosis measurements (when applicable) are obtained utilizing NASCET criteria, using the distal internal carotid diameter as the denominator. CONTRAST:  162mL OMNIPAQUE IOHEXOL 350 MG/ML SOLN COMPARISON:  None. FINDINGS: CTA NECK FINDINGS SKELETON: There is no bony spinal canal stenosis. No lytic or blastic lesion. OTHER NECK: Normal pharynx, larynx and major salivary glands. No cervical lymphadenopathy. Unremarkable thyroid gland. UPPER CHEST: No pneumothorax or pleural effusion. No nodules or masses. AORTIC ARCH: There is no calcific atherosclerosis of the aortic arch. There is no aneurysm, dissection or hemodynamically significant stenosis of the visualized portion of the aorta. Conventional 3 vessel aortic branching pattern. The visualized proximal subclavian arteries are widely patent. RIGHT CAROTID SYSTEM: Normal without aneurysm, dissection or stenosis. LEFT CAROTID SYSTEM: Normal without aneurysm, dissection or stenosis. VERTEBRAL ARTERIES: Left dominant configuration. Both origins are clearly patent. There is no dissection, occlusion or flow-limiting stenosis to the skull base (V1-V3 segments). CTA HEAD FINDINGS POSTERIOR CIRCULATION: --Vertebral arteries: Normal V4 segments. --Inferior cerebellar arteries: Normal.  --Basilar artery: Normal. --Superior cerebellar arteries: Normal. --Posterior cerebral arteries (PCA): Normal. ANTERIOR CIRCULATION: --Intracranial internal carotid arteries: Normal. --Anterior cerebral arteries (ACA): Normal. Both A1 segments are present. Patent anterior communicating artery (a-comm). --Middle cerebral arteries (MCA): Normal. VENOUS SINUSES: As permitted by contrast timing, patent. ANATOMIC VARIANTS: None Other: There is hypoattenuation within the right PCA territory as demonstrated on earlier head CT and confirmed with MRI. No progression of suspected petechial hemorrhage. Review of the MIP images confirms the above findings. IMPRESSION: 1. Normal intracranial and cervical arteries. The right PCA is patent. 2. Unchanged appearance of right PCA territory ischemia with subacute to chronic blood products. Electronically Signed   By: Ulyses Jarred M.D.   On: 10/09/2019 02:43   CT HEAD WO CONTRAST  Result Date: 10/08/2019 CLINICAL DATA:  Dizziness. EXAM: CT HEAD WITHOUT CONTRAST TECHNIQUE: Contiguous axial images were obtained from the base of the skull through the vertex without intravenous contrast. COMPARISON:  None. FINDINGS: Brain: There is abnormal mixed attenuation in the right PCA territory worrisome for infarct with associated petechial hemorrhage. The brain otherwise appears normal. No midline shift or hydrocephalus. Vascular: Atherosclerosis.  No hyperdense vessel is identified. Skull: Intact.  No focal lesion. Sinuses/Orbits: Negative Other: . none. 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 Brain MRI with and without contrast is recommended for confirmation. Electronically Signed   By: Inge Rise M.D.   On: 10/08/2019 15:05   CT ANGIO NECK W OR WO CONTRAST  Result Date: 10/09/2019 CLINICAL DATA:  Stroke follow-up EXAM: CT ANGIOGRAPHY HEAD AND NECK TECHNIQUE: Multidetector CT imaging of the head and neck was  performed using the standard protocol during bolus administration of intravenous contrast. Multiplanar CT image reconstructions and MIPs were obtained to evaluate the vascular anatomy. Carotid stenosis measurements (when applicable) are obtained utilizing NASCET criteria, using the distal internal carotid diameter as the denominator. CONTRAST:  163mL OMNIPAQUE IOHEXOL 350 MG/ML SOLN COMPARISON:  None. FINDINGS: CTA NECK FINDINGS SKELETON: There is no bony spinal canal stenosis. No lytic or blastic lesion. OTHER NECK: Normal pharynx, larynx and major salivary glands. No cervical lymphadenopathy. Unremarkable thyroid gland. UPPER CHEST: No pneumothorax or pleural effusion. No nodules or masses. AORTIC ARCH: There is no calcific atherosclerosis of the aortic arch. There is no aneurysm, dissection or hemodynamically significant stenosis of the visualized portion of the aorta. Conventional 3 vessel aortic branching pattern. The visualized proximal subclavian arteries are widely patent. RIGHT CAROTID  SYSTEM: Normal without aneurysm, dissection or stenosis. LEFT CAROTID SYSTEM: Normal without aneurysm, dissection or stenosis. VERTEBRAL ARTERIES: Left dominant configuration. Both origins are clearly patent. There is no dissection, occlusion or flow-limiting stenosis to the skull base (V1-V3 segments). CTA HEAD FINDINGS POSTERIOR CIRCULATION: --Vertebral arteries: Normal V4 segments. --Inferior cerebellar arteries: Normal. --Basilar artery: Normal. --Superior cerebellar arteries: Normal. --Posterior cerebral arteries (PCA): Normal. ANTERIOR CIRCULATION: --Intracranial internal carotid arteries: Normal. --Anterior cerebral arteries (ACA): Normal. Both A1 segments are present. Patent anterior communicating artery (a-comm). --Middle cerebral arteries (MCA): Normal. VENOUS SINUSES: As permitted by contrast timing, patent. ANATOMIC VARIANTS: None Other: There is hypoattenuation within the right PCA territory as demonstrated on  earlier head CT and confirmed with MRI. No progression of suspected petechial hemorrhage. Review of the MIP images confirms the above findings. IMPRESSION: 1. Normal intracranial and cervical arteries. The right PCA is patent. 2. Unchanged appearance of right PCA territory ischemia with subacute to chronic blood products. Electronically Signed   By: Ulyses Jarred M.D.   On: 10/09/2019 02:43   MR BRAIN WO CONTRAST  Result Date: 10/08/2019 CLINICAL DATA:  Left leg weakness with difficulty walking. Dizziness. EXAM: MRI HEAD WITHOUT CONTRAST TECHNIQUE: Multiplanar, multiecho pulse sequences of the brain and surrounding structures were obtained without intravenous contrast. COMPARISON:  Head CT 10/08/2019 FINDINGS: Brain: There is abnormal diffusion restriction within the medial right temporal lobe and at the dorsolateral right thalamus and posterior globus pallidus, adjacent to the posterior limb of the internal capsule. There is intraparenchymal blood within the posteromedial right temporal lobe. No midline shift or other mass effect. Normal white matter signal. Normal volume of CSF spaces. No chronic microhemorrhage. Normal midline structures. Vascular: Normal flow voids. Skull and upper cervical spine: Normal marrow signal. Sinuses/Orbits: Negative. Other: None. IMPRESSION: 1. Acute infarct of the right PCA territory, which affects the posterior limb of the right internal capsule, in keeping with reported left lower extremity weakness. 2. Petechial blood within the posteromedial right temporal lobe. No mass effect. Heidelberg classification 1b: HI2, confluent petechiae, no mass effect. Electronically Signed   By: Ulyses Jarred M.D.   On: 10/08/2019 20:58   DG Chest Portable 1 View  Result Date: 10/08/2019 CLINICAL DATA:  complaints of Afib RVR with HR of 150 bpm. Pt received 10 mg of cardizeim and 900 ml normal saline fluid prior to arrival. HR now at 64 bpm. C/O dizziness.afib EXAM: PORTABLE CHEST 1 VIEW  COMPARISON:  None. FINDINGS: Normal mediastinum and cardiac silhouette. Normal pulmonary vasculature. No evidence of effusion, infiltrate, or pneumothorax. No acute bony abnormality. IMPRESSION: No acute cardiopulmonary process. Electronically Signed   By: Suzy Bouchard M.D.   On: 10/08/2019 12:23   ECHOCARDIOGRAM COMPLETE BUBBLE STUDY  Result Date: 10/09/2019    ECHOCARDIOGRAM REPORT   Patient Name:   CARLITOS BOTTINO Date of Exam: 10/09/2019 Medical Rec #:  [id number]     Height:       70.0 in Accession #:    [id number]    Weight:       220.0 lb Date of Birth:  June 23, 1955    BSA:          2.174 m Patient Age:    29 years      BP:           162/99 mmHg Patient Gender: M             HR:           74 bpm. Exam Location:  Inpatient  Procedure: 2D Echo, Cardiac Doppler, Color Doppler and Saline Contrast Bubble            Study Indications:    Stroke 434.91 / I63.9  History:        Patient has prior history of Echocardiogram examinations, most                 recent 06/04/2019. Cardiomyopathy, Stroke, Arrythmias:Atrial                 Fibrillation and non-specific ST changes; Risk                 Factors:Hypertension, Dyslipidemia and Non-Smoker.  Sonographer:    Vickie Epley RDCS Referring Phys: [id number] [facility] R AROOR  Sonographer Comments: Image acquisition challenging due to respiratory motion. IMPRESSIONS  1. Left ventricular ejection fraction, by estimation, is 60 to 65%. The left ventricle has normal function. The left ventricle has no regional wall motion abnormalities. Left ventricular diastolic parameters are consistent with Grade I diastolic dysfunction (impaired relaxation).  2. Right ventricular systolic function is normal. The right ventricular size is normal.  3. Left atrial size was mildly dilated.  4. The mitral valve is normal in structure. No evidence of mitral valve regurgitation. No evidence of mitral stenosis.  5. The aortic valve is tricuspid. Aortic valve regurgitation is not visualized. Mild to  moderate aortic valve sclerosis/calcification is present, without any evidence of aortic stenosis.  6. Aortic dilatation noted. There is mild dilatation at the level of the sinuses of Valsalva measuring 38 mm.  7. The inferior vena cava is normal in size with greater than 50% respiratory variability, suggesting right atrial pressure of 3 mmHg.  8. Agitated saline contrast bubble study was negative, with no evidence of any interatrial shunt. Conclusion(s)/Recommendation(s): No intracardiac source of embolism detected on this transthoracic study. A transesophageal echocardiogram is recommended to exclude cardiac source of embolism if clinically indicated. FINDINGS  Left Ventricle: Left ventricular ejection fraction, by estimation, is 60 to 65%. The left ventricle has normal function. The left ventricle has no regional wall motion abnormalities. The left ventricular internal cavity size was normal in size. There is  no left ventricular hypertrophy. Left ventricular diastolic parameters are consistent with Grade I diastolic dysfunction (impaired relaxation). Right Ventricle: The right ventricular size is normal. No increase in right ventricular wall thickness. Right ventricular systolic function is normal. Left Atrium: Left atrial size was mildly dilated. Right Atrium: Right atrial size was normal in size. Pericardium: There is no evidence of pericardial effusion. Mitral Valve: The mitral valve is normal in structure. Normal mobility of the mitral valve leaflets. No evidence of mitral valve regurgitation. No evidence of mitral valve stenosis. Tricuspid Valve: The tricuspid valve is normal in structure. Tricuspid valve regurgitation is not demonstrated. No evidence of tricuspid stenosis. Aortic Valve: The aortic valve is tricuspid. . There is moderate thickening and moderate calcification of the aortic valve. Aortic valve regurgitation is not visualized. Mild to moderate aortic valve sclerosis/calcification is present,  without any evidence of aortic stenosis. There is moderate thickening of the aortic valve. There is moderate calcification of the aortic valve. Pulmonic Valve: The pulmonic valve was normal in structure. Pulmonic valve regurgitation is not visualized. No evidence of pulmonic stenosis. Aorta: Aortic dilatation noted. There is mild dilatation at the level of the sinuses of Valsalva measuring 38 mm. Venous: The inferior vena cava is normal in size with greater than 50% respiratory variability, suggesting right atrial pressure of 3 mmHg.  IAS/Shunts: No atrial level shunt detected by color flow Doppler. Agitated saline contrast was given intravenously to evaluate for intracardiac shunting. Agitated saline contrast bubble study was negative, with no evidence of any interatrial shunt. There  is no evidence of a patent foramen ovale. There is no evidence of an atrial septal defect.  LEFT VENTRICLE PLAX 2D LVIDd:         5.10 cm      Diastology LVIDs:         4.00 cm      LV e' lateral:   10.70 cm/s LV PW:         0.80 cm      LV E/e' lateral: 9.3 LV IVS:        0.80 cm      LV e' medial:    5.76 cm/s LVOT diam:     2.20 cm      LV E/e' medial:  17.3 LV SV:         76 LV SV Index:   35 LVOT Area:     3.80 cm  LV Volumes (MOD) LV vol d, MOD A2C: 124.0 ml LV vol d, MOD A4C: 125.0 ml LV vol s, MOD A2C: 64.9 ml LV vol s, MOD A4C: 58.3 ml LV SV MOD A2C:     59.1 ml LV SV MOD A4C:     125.0 ml LV SV MOD BP:      63.1 ml RIGHT VENTRICLE             IVC RV S prime:     11.80 cm/s  IVC diam: 1.10 cm TAPSE (M-mode): 2.3 cm LEFT ATRIUM             Index       RIGHT ATRIUM           Index LA diam:        4.50 cm 2.07 cm/m  RA Area:     14.60 cm LA Vol (A2C):   38.3 ml 17.62 ml/m RA Volume:   35.40 ml  16.29 ml/m LA Vol (A4C):   29.9 ml 13.76 ml/m LA Biplane Vol: 33.9 ml 15.60 ml/m  AORTIC VALVE LVOT Vmax:   94.70 cm/s LVOT Vmean:  69.100 cm/s LVOT VTI:    0.199 m  AORTA Ao Root diam: 3.80 cm Ao Asc diam:  3.70 cm MITRAL VALVE MV  Area (PHT): 3.03 cm    SHUNTS MV Decel Time: 250 msec    Systemic VTI:  0.20 m MV E velocity: 99.40 cm/s  Systemic Diam: 2.20 cm MV A velocity: 36.40 cm/s MV E/A ratio:  2.73 Candee Furbish MD Electronically signed by Candee Furbish MD Signature Date/Time: 10/09/2019/1:07:12 PM    Final         Scheduled Meds: . aspirin  81 mg Oral Daily  . ezetimibe  10 mg Oral QHS  . insulin aspart  0-5 Units Subcutaneous QHS  . insulin aspart  0-9 Units Subcutaneous TID WC  . lisinopril  2.5 mg Oral Daily  . metoprolol tartrate  75 mg Oral BID  . potassium chloride  20 mEq Oral BID  . rosuvastatin  20 mg Oral Daily   Continuous Infusions:   LOS: 2 days    Time spent: 30 minutes    Barb Merino, MD [facility] Pager [phone]

## 2019-10-10 NOTE — Plan of Care (Signed)
  Problem: Education: Goal: Knowledge of General Education information will improve Description: Including pain rating scale, medication(s)/side effects and non-pharmacologic comfort measures Outcome: Progressing   Problem: Nutrition: Goal: Adequate nutrition will be maintained Outcome: Progressing   Problem: Safety: Goal: Ability to remain free from injury will improve Outcome: Progressing   

## 2019-10-11 LAB — GLUCOSE, CAPILLARY
Glucose-Capillary: 136 mg/dL — ABNORMAL HIGH (ref 70–99)
Glucose-Capillary: 151 mg/dL — ABNORMAL HIGH (ref 70–99)
Glucose-Capillary: 170 mg/dL — ABNORMAL HIGH (ref 70–99)
Glucose-Capillary: 166 mg/dL — ABNORMAL HIGH (ref 70–99)

## 2019-10-11 LAB — CEA: CEA: 15.8 ng/mL — ABNORMAL HIGH (ref 0.0–4.7)

## 2019-10-11 NOTE — Progress Notes (Signed)
PROGRESS NOTE    John Douglas  KDX:[id number] DOB: June 03, 1955 DOA: 10/08/2019 PCP: Leonides Sake, MD    Brief Narrative:  64 year old gentleman with extensive cardiovascular problems.  STEMI and PCI to RCA in [id number]/6734 complicated by sustained ventricular tachycardia .  Aspirin and Brilinta Non-STEMI and subsequent PCI to LAD on 06/2019 along with paroxysmal A. fib.  Aspirin and Plavix.  Anticoagulation deferred because of ongoing GI evaluation.  He was having intermittent hematochezia more than 6 months ago.  He attributed that to Metformin. Never had colonoscopy.  Because of STEMI and LAD stent, waited 3 months of dual antiplatelet therapy to get a colonoscopy.  6/9, Plavix held for 1 week and continued on aspirin, underwent colonoscopy, found to have adenocarcinoma of the colon with partially obstructive lesion. Since the day of colonoscopy or the night before only he was having some weakness, dragging his left foot, dizzy and disoriented.  He reported the symptoms while he was taking bowel prep that was thought to be from bowel prep.  6/10, went back to [facility] ER.  Found to be in A. fib with RVR heart rate 150.  CT head showed right PCA distribution infarct and transferred to [facility].   Assessment & Plan:   Principal Problem:   Ischemic stroke [facility]) Active Problems:   Paroxysmal atrial fibrillation (HCC)   Essential hypertension   Hyperlipidemia LDL goal <70   Coronary artery disease   Ischemic cardiomyopathy   Leukocytosis   Hypokalemia   Colon cancer (HCC)  Right PCA territory ischemic infarct with petechial hemorrhagic conversion: With history of paroxysmal A. Fib Clinical findings, dizziness, lightheadedness and confusion, left-sided weakness. CT head findings, right PCA territory ischemic infarct. MRI of the brain, acute infarct right PCA territory, posterior limb of internal capsule.  Petechial blood within the posterior medial right temporal lobe.  No  mass-effect. CTA of the head and neck, no large vessel occlusion. 2D echocardiogram,normal. Echocardiogram 06/2019 with ejection fraction 45%. Antiplatelet therapy, aspirin at home continued.  Plavix on hold for the last 7 days PTA, continue to hold because of petechial hemorrhage.  Patient will probably need anticoagulation once work-up completed. LDL 98.  Last LDL 32 on 4/30.  On rosuvastatin and Zetia.  Continue. Hemoglobin A1c 8.  Not on treatment.  Was on Metformin in the past so he must be diagnosed with diabetes. Continue to work with PT OT.  Appreciate neurology input.  Stable to transfer to CIR.  Paroxysmal atrial fibrillation:  Please in sinus rhythm today.  Resumed metoprolol.  Anticoagulation on hold.  Rate controlled.  Coronary artery disease, ST elevation MI 03/2019, non-STEMI 05/2018 with VT:  Currently stable.  Without chest pain. Patient metoprolol and lisinopril.  On aspirin.  Plavix on hold.  Nitroglycerin as needed.  Hematochezia/recently diagnosed adenocarcinoma of the colon:  New diagnosed metastatic adenocarcinoma.  Followed by oncology.   Staging MR and Ct chest / abdomen completed. Followed by Dr Marin Olp   Hypokalemia:  Replaced.   DVT prophylaxis: SCD Code Status: Full code Family Communication: Patient's girlfriend at the bedside. Disposition Plan: Status is: Inpatient  Remains inpatient appropriate because: Stabilizing.  Anticipating transfer to rehab with oncology follow-up for neoadjuvant therapy.   Dispo: The patient is from: Home              Anticipated d/c is to: Inpatient rehab versus home.              Anticipated d/c date is: Medically stable.  Patient currently medically stable to transfer to rehab.         Consultants:   Oncology  Neurology  Procedures:   None  Antimicrobials:   None   Subjective: Seen and examined.  No overnight events.  Able to walk.  Still has some weakness on the left lower leg.  Aware  about the MRI findings and his girlfriend at the bedside explaining that to him.  Objective: Vitals:   10/10/19 1926 10/10/19 2315 10/11/19 0522 10/11/19 0746  BP: (!) 166/92 (!) 163/91 (!) 156/95 (!) 141/76  Pulse: 72 63 64 64  Resp: 18 18 18 18   Temp: 98.6 F (37 C) 98.2 F (36.8 C) 98.3 F (36.8 C) 98.1 F (36.7 C)  TempSrc: Oral Oral Oral Oral  SpO2: 97% 97% 96% 99%  Weight:      Height:        Intake/Output Summary (Last 24 hours) at 10/11/2019 1110 Last data filed at 10/11/2019 0815 Gross per 24 hour  Intake 150 ml  Output --  Net 150 ml   Filed Weights   10/08/19 1122  Weight: 99.8 kg    Examination:  Physical Exam HENT:     Head: Normocephalic.  Eyes:     Pupils: Pupils are equal, round, and reactive to light.  Cardiovascular:     Rate and Rhythm: Normal rate and regular rhythm.  Pulmonary:     Breath sounds: Normal breath sounds.  Abdominal:     Palpations: Abdomen is soft.  Neurological:     Mental Status: He is alert and oriented to person, place, and time.     Comments: Cranial nerves no deficit. Left lower extremity 4/5.  Rest is normal.      Data Reviewed: I have personally reviewed following labs and imaging studies  CBC: Recent Labs  Lab 10/07/19 1042 10/08/19 1128 10/09/19 0500  WBC 8.5 12.3* 8.8  NEUTROABS 6.3  --  6.3  HGB 12.5* 12.2* 12.0*  HCT 37.9* 38.1* 37.0*  MCV 82.8 85.8 84.7  PLT 252 256 737   Basic Metabolic Panel: Recent Labs  Lab 10/07/19 1042 10/08/19 1128 10/09/19 0500  NA 141 137 140  K 4.0 3.4* 3.2*  CL 105 106 109  CO2 26 19* 22  GLUCOSE 167* 194* 155*  BUN 16 8 10   CREATININE 0.87 0.90 0.94  CALCIUM 8.9 8.2* 8.6*  MG  --  1.8  --    GFR: Estimated Creatinine Clearance: 92.8 mL/min (by C-G formula based on SCr of 0.94 mg/dL). Liver Function Tests: Recent Labs  Lab 10/07/19 1042  AST 16  ALT 16  ALKPHOS 56  BILITOT 0.8  PROT 6.7  ALBUMIN 3.9   No results for input(s): LIPASE, AMYLASE in the  last 168 hours. No results for input(s): AMMONIA in the last 168 hours. Coagulation Profile: No results for input(s): INR, PROTIME in the last 168 hours. Cardiac Enzymes: No results for input(s): CKTOTAL, CKMB, CKMBINDEX, TROPONINI in the last 168 hours. BNP (last 3 results) No results for input(s): PROBNP in the last 8760 hours. HbA1C: Recent Labs    10/09/19 0500  HGBA1C 8.0*   CBG: Recent Labs  Lab 10/10/19 0602 10/10/19 1145 10/10/19 1743 10/10/19 2210 10/11/19 0553  GLUCAP 162* 197* 172* 148* 136*   Lipid Profile: Recent Labs    10/09/19 0500  CHOL 148  HDL 26*  LDLCALC 98  TRIG 119  CHOLHDL 5.7   Thyroid Function Tests: No results for input(s): TSH, T4TOTAL, FREET4,  T3FREE, THYROIDAB in the last 72 hours. Anemia Panel: No results for input(s): VITAMINB12, FOLATE, FERRITIN, TIBC, IRON, RETICCTPCT in the last 72 hours. Sepsis Labs: No results for input(s): PROCALCITON, LATICACIDVEN in the last 168 hours.  Recent Results (from the past 240 hour(s))  SARS CORONAVIRUS 2 (TAT 6-24 HRS) Nasopharyngeal Nasopharyngeal Swab     Status: None   Collection Time: 10/03/19  1:23 PM   Specimen: Nasopharyngeal Swab  Result Value Ref Range Status   SARS Coronavirus 2 NEGATIVE NEGATIVE Final    Comment: (NOTE) SARS-CoV-2 target nucleic acids are NOT DETECTED. The SARS-CoV-2 RNA is generally detectable in upper and lower respiratory specimens during the acute phase of infection. Negative results do not preclude SARS-CoV-2 infection, do not rule out co-infections with other pathogens, and should not be used as the sole basis for treatment or other patient management decisions. Negative results must be combined with clinical observations, patient history, and epidemiological information. The expected result is Negative. Fact Sheet for Patients: SugarRoll.be Fact Sheet for Healthcare Providers: https://www.woods-mathews.com/ This  test is not yet approved or cleared by the [place] FDA and  has been authorized for detection and/or diagnosis of SARS-CoV-2 by FDA under an Emergency Use Authorization (EUA). This EUA will remain  in effect (meaning this test can be used) for the duration of the COVID-19 declaration under Section 56 4(b)(1) of the Act, 21 U.S.C. section 360bbb-3(b)(1), unless the authorization is terminated or revoked sooner. Performed at [facility], [place] [address] [place], [place] [address]   SARS Coronavirus 2 by RT PCR (hospital order, performed in [facility] hospital lab) Nasopharyngeal Nasopharyngeal Swab     Status: None   Collection Time: 10/08/19  5:40 PM   Specimen: Nasopharyngeal Swab  Result Value Ref Range Status   SARS Coronavirus 2 NEGATIVE NEGATIVE Final    Comment: (NOTE) SARS-CoV-2 target nucleic acids are NOT DETECTED.  The SARS-CoV-2 RNA is generally detectable in upper and lower respiratory specimens during the acute phase of infection. The lowest concentration of SARS-CoV-2 viral copies this assay can detect is 250 copies / mL. A negative result does not preclude SARS-CoV-2 infection and should not be used as the sole basis for treatment or other patient management decisions.  A negative result may occur with improper specimen collection / handling, submission of specimen other than nasopharyngeal swab, presence of viral mutation(s) within the areas targeted by this assay, and inadequate number of viral copies (<250 copies / mL). A negative result must be combined with clinical observations, patient history, and epidemiological information.  Fact Sheet for Patients:   StrictlyIdeas.no  Fact Sheet for Healthcare Providers: BankingDealers.co.za  This test is not yet approved or  cleared by the [place] FDA and has been authorized for detection and/or diagnosis of SARS-CoV-2 by FDA under an Emergency Use  Authorization (EUA).  This EUA will remain in effect (meaning this test can be used) for the duration of the COVID-19 declaration under Section 564(b)(1) of the Act, 21 U.S.C. section 360bbb-3(b)(1), unless the authorization is terminated or revoked sooner.  Performed at [facility], [place] [address], [place], [place] [address]          Radiology Studies: CT CHEST W CONTRAST  Result Date: 10/10/2019 CLINICAL DATA:  Rectal cancer staging. EXAM: CT CHEST AND ABDOMEN WITH CONTRAST TECHNIQUE: Multidetector CT imaging of the chest and abdomen was performed following the standard protocol during bolus administration of intravenous contrast. CONTRAST:  171mL OMNIPAQUE IOHEXOL 300  MG/ML  SOLN COMPARISON:  None FINDINGS: CT CHEST FINDINGS Cardiovascular: Calcified coronary artery disease. Calcified and noncalcified plaque in the nonaneurysmal thoracic aorta. Central pulmonary vasculature is unremarkable. Heart size is normal without pericardial effusion. Mediastinum/Nodes: Mild circumferential distal esophageal thickening. No thoracic inlet adenopathy. No axillary adenopathy. No mediastinal adenopathy. No hilar adenopathy. Lungs/Pleura: No consolidation. No pleural effusion. Airways are patent. Musculoskeletal: No chest wall mass. See below for full musculoskeletal details. CT ABDOMEN FINDINGS Hepatobiliary: Hyperenhancing focus in the LEFT hepatic lobe with surrounding changes in parenchymal attenuation on venous phase. No imaging of this area on delayed phase. No pericholecystic stranding or biliary ductal dilation. Pancreas: Pancreas normal without focal lesion. Spleen: Spleen normal size without focal lesion. Adrenals/Urinary Tract: Adrenal glands are normal. No hydronephrosis. Symmetric renal enhancement. Stomach/Bowel: Bowel without acute process to the extent evaluated. Visualized portions of the appendix are normal. Rectal mass not visualized. Vascular/Lymphatic: Calcific and noncalcific  atheromatous plaque in the abdominal aorta. No aneurysm. Retroperitoneal and upper pelvic lymphadenopathy. (Image 96, series 3) 14 mm short axis LEFT common iliac lymph node. Intra-aortocaval lymph node (image 75, series 3) 14 mm short axis. Rounded lymph nodes throughout the LEFT and RIGHT periaortic chain next largest lymph node on image 76 of series 3 measuring 13 mm. RIGHT retrocrural lymph node with enlargement 12 mm. Other: No ascites.  No peritoneal nodularity. Musculoskeletal: Spinal degenerative changes. No acute or destructive bone process. IMPRESSION: 1. Retroperitoneal and upper pelvic lymphadenopathy, compatible with metastatic disease in a patient with known rectal cancer 2. Hyperenhancing focus in the LEFT hepatic lobe with surrounding changes in parenchymal attenuation on venous phase. No imaging of this area on delayed phase. This may represent a flash filling hemangioma and appears atypical for what would be expected for rectal cancer metastasis. However, would suggest abdominal MRI for definitive assessment. 3. Mild circumferential distal esophageal thickening, nonspecific. Correlate with any symptoms of esophagitis. 4. Calcified coronary artery disease. 5. Aortic atherosclerosis. Aortic Atherosclerosis (ICD10-I70.0). Electronically Signed   By: Zetta Bills M.D.   On: 10/10/2019 12:04   CT ABDOMEN W CONTRAST  Result Date: 10/10/2019 CLINICAL DATA:  Rectal cancer staging. EXAM: CT CHEST AND ABDOMEN WITH CONTRAST TECHNIQUE: Multidetector CT imaging of the chest and abdomen was performed following the standard protocol during bolus administration of intravenous contrast. CONTRAST:  113mL OMNIPAQUE IOHEXOL 300 MG/ML  SOLN COMPARISON:  None FINDINGS: CT CHEST FINDINGS Cardiovascular: Calcified coronary artery disease. Calcified and noncalcified plaque in the nonaneurysmal thoracic aorta. Central pulmonary vasculature is unremarkable. Heart size is normal without pericardial effusion.  Mediastinum/Nodes: Mild circumferential distal esophageal thickening. No thoracic inlet adenopathy. No axillary adenopathy. No mediastinal adenopathy. No hilar adenopathy. Lungs/Pleura: No consolidation. No pleural effusion. Airways are patent. Musculoskeletal: No chest wall mass. See below for full musculoskeletal details. CT ABDOMEN FINDINGS Hepatobiliary: Hyperenhancing focus in the LEFT hepatic lobe with surrounding changes in parenchymal attenuation on venous phase. No imaging of this area on delayed phase. No pericholecystic stranding or biliary ductal dilation. Pancreas: Pancreas normal without focal lesion. Spleen: Spleen normal size without focal lesion. Adrenals/Urinary Tract: Adrenal glands are normal. No hydronephrosis. Symmetric renal enhancement. Stomach/Bowel: Bowel without acute process to the extent evaluated. Visualized portions of the appendix are normal. Rectal mass not visualized. Vascular/Lymphatic: Calcific and noncalcific atheromatous plaque in the abdominal aorta. No aneurysm. Retroperitoneal and upper pelvic lymphadenopathy. (Image 96, series 3) 14 mm short axis LEFT common iliac lymph node. Intra-aortocaval lymph node (image 75, series 3) 14 mm short axis. Rounded lymph  nodes throughout the LEFT and RIGHT periaortic chain next largest lymph node on image 76 of series 3 measuring 13 mm. RIGHT retrocrural lymph node with enlargement 12 mm. Other: No ascites.  No peritoneal nodularity. Musculoskeletal: Spinal degenerative changes. No acute or destructive bone process. IMPRESSION: 1. Retroperitoneal and upper pelvic lymphadenopathy, compatible with metastatic disease in a patient with known rectal cancer 2. Hyperenhancing focus in the LEFT hepatic lobe with surrounding changes in parenchymal attenuation on venous phase. No imaging of this area on delayed phase. This may represent a flash filling hemangioma and appears atypical for what would be expected for rectal cancer metastasis. However,  would suggest abdominal MRI for definitive assessment. 3. Mild circumferential distal esophageal thickening, nonspecific. Correlate with any symptoms of esophagitis. 4. Calcified coronary artery disease. 5. Aortic atherosclerosis. Aortic Atherosclerosis (ICD10-I70.0). Electronically Signed   By: Zetta Bills M.D.   On: 10/10/2019 12:04   MR PELVIS WO CONTRAST  Result Date: 10/10/2019 CLINICAL DATA:  Known rectal mass. EXAM: MRI PELVIS WITHOUT CONTRAST TECHNIQUE: Multiplanar multisequence MR imaging of the pelvis was performed. No intravenous contrast was administered. Small amount of [place] gel was administered per rectum to optimize tumor evaluation. COMPARISON:  CT chest and abdomen performed on 10/10/2019 FINDINGS: TUMOR LOCATION Tumor distance from Anal Verge/Skin Surface:  9 cm Tumor distance to Internal Anal Sphincter: 4 cm TUMOR DESCRIPTION Circumferential Extent: Completely circumferential Tumor Length: 8.3 cm T - CATEGORY Extension through Muscularis Propria: Yes. Nodular extension beyond the colonic lumen approximately 7 mm. Shortest Distance of any tumor/node from Mesorectal Fascia: 0 mm tumor extending to the high mesorectum by way of extramural venous invasion and tumor deposits along the LEFTa mesorectum, high mesorectum posteriorly (image 6 of 32 and image 20 of 18 Extramural Vascular Invasion/Tumor Thrombus: Yes Invasion of Anterior Peritoneal Reflection: Yes nodular extension of tumor into the anterior peritoneal reflection best seen on image 14 of series 17. Involvement of Adjacent Organs or Pelvic Sidewall: No Levator Ani Involvement: No N - CATEGORY Mesorectal Lymph Nodes >=30mm: Greater than 4 abnormal lymph nodes within the mesorectum at least 7. Extra-mesorectal Lymphadenopathy: Yes, CT abdomen and pelvis CT with lymph nodes in the retroperitoneum. Other: Urinary bladder is unremarkable. Heterogeneity of the prostate. Small bilateral fat containing inguinal hernias. No acute bone finding to  the extent the pelvis is visualized. No suspicious bone lesion. Contrast not administered for rectal cancer staging IMPRESSION: Rectal adenocarcinoma T stage: T4 a Rectal adenocarcinoma N stage: N2, also with retroperitoneal, metastatic non regional lymph nodes. Distance from tumor to the internal anal sphincter is approximately 4 cm cm. Electronically Signed   By: Zetta Bills M.D.   On: 10/10/2019 18:15        Scheduled Meds: . aspirin  81 mg Oral Daily  . ezetimibe  10 mg Oral QHS  . insulin aspart  0-5 Units Subcutaneous QHS  . insulin aspart  0-9 Units Subcutaneous TID WC  . lisinopril  2.5 mg Oral Daily  . metoprolol tartrate  75 mg Oral BID  . rosuvastatin  20 mg Oral Daily   Continuous Infusions:   LOS: 3 days    Time spent: 30 minutes    Barb Merino, MD [facility] Pager [phone]

## 2019-10-11 NOTE — Plan of Care (Signed)
  Problem: Education: Goal: Knowledge of General Education information will improve Description: Including pain rating scale, medication(s)/side effects and non-pharmacologic comfort measures Outcome: Progressing   Problem: Nutrition: Goal: Adequate nutrition will be maintained Outcome: Progressing   Problem: Safety: Goal: Ability to remain free from injury will improve Outcome: Progressing   

## 2019-10-12 ENCOUNTER — Encounter: Payer: Self-pay | Admitting: *Deleted

## 2019-10-12 ENCOUNTER — Inpatient Hospital Stay (HOSPITAL_COMMUNITY): Payer: Medicare HMO

## 2019-10-12 DIAGNOSIS — D649 Anemia, unspecified: Secondary | ICD-10-CM

## 2019-10-12 DIAGNOSIS — C787 Secondary malignant neoplasm of liver and intrahepatic bile duct: Secondary | ICD-10-CM

## 2019-10-12 DIAGNOSIS — C778 Secondary and unspecified malignant neoplasm of lymph nodes of multiple regions: Secondary | ICD-10-CM

## 2019-10-12 DIAGNOSIS — E1165 Type 2 diabetes mellitus with hyperglycemia: Secondary | ICD-10-CM

## 2019-10-12 DIAGNOSIS — R970 Elevated carcinoembryonic antigen [CEA]: Secondary | ICD-10-CM

## 2019-10-12 LAB — IRON AND TIBC
Iron: 46 ug/dL (ref 45–182)
Saturation Ratios: 13 % — ABNORMAL LOW (ref 17.9–39.5)
TIBC: 343 ug/dL (ref 250–450)
UIBC: 297 ug/dL

## 2019-10-12 LAB — GLUCOSE, CAPILLARY
Glucose-Capillary: 145 mg/dL — ABNORMAL HIGH (ref 70–99)
Glucose-Capillary: 158 mg/dL — ABNORMAL HIGH (ref 70–99)
Glucose-Capillary: 116 mg/dL — ABNORMAL HIGH (ref 70–99)
Glucose-Capillary: 132 mg/dL — ABNORMAL HIGH (ref 70–99)

## 2019-10-12 LAB — FERRITIN: Ferritin: 35 ng/mL (ref 24–336)

## 2019-10-12 MED ORDER — GADOBUTROL 1 MMOL/ML IV SOLN
9.0000 mL | Freq: Once | INTRAVENOUS | Status: AC | PRN
  Administered 2019-10-12: 9 mL via INTRAVENOUS

## 2019-10-12 MED ORDER — METHOCARBAMOL 500 MG PO TABS
500.0000 mg | ORAL_TABLET | Freq: Three times a day (TID) | ORAL | Status: DC | PRN
  Administered 2019-10-12 – 2019-10-15 (×5): 500 mg via ORAL
  Filled 2019-10-12 (×5): qty 1

## 2019-10-12 NOTE — Progress Notes (Signed)
Per Dr Marin Olp, paradigm testing request sent on  Specimen [id number] DOS 10/07/2019

## 2019-10-12 NOTE — Progress Notes (Signed)
Unfortunately, it looks like we may be dealing with disease that is stage IV.  It looks like on the MRI and CT scan that he has adenopathy away from the rectum.  I would get surgery just to see him and just to make sure that his not a candidate for surgery after neoadjuvant therapy.  I think this is can be very important and this will help [place] dictate how we treat him.  I would get an MRI of the liver as there might be a lesion in the liver.  His CEA is elevated at 16.  He says that his left leg feels better.  Is not as "restless."  His appetite is doing all right.  He has had no nausea or vomiting.  His blood sugars have still been on the high side.  He is minimally anemic.  I probably would check iron stores on him.  It would not surprise me if his iron stores were low.  I will send his tumor off for molecular markers.  This certainly could help [place] out with therapeutic options depending on whether or not he has metastatic disease or is a candidate for neoadjuvant chemoradiation therapy.  I think he still planning on going to Rehab Unit.  If we do find that he has metastatic disease, I guess the question is when to start him on treatment.  I know he and his family have a trip planned to the beach the first week in July.  I very much appreciate the wonderful care that he is getting from all the staff on [address].  Lattie Haw, MD  Revelation 21:7

## 2019-10-12 NOTE — Plan of Care (Signed)
  Problem: Education: Goal: Knowledge of General Education information will improve Description: Including pain rating scale, medication(s)/side effects and non-pharmacologic comfort measures Outcome: Progressing   Problem: Health Behavior/Discharge Planning: Goal: Ability to manage health-related needs will improve Outcome: Progressing   Problem: Clinical Measurements: Goal: Ability to maintain clinical measurements within normal limits will improve Outcome: Progressing Goal: Diagnostic test results will improve Outcome: Progressing   Problem: Activity: Goal: Risk for activity intolerance will decrease Outcome: Progressing   Problem: Nutrition: Goal: Adequate nutrition will be maintained Outcome: Progressing   Problem: Coping: Goal: Level of anxiety will decrease Outcome: Progressing   Problem: Safety: Goal: Ability to remain free from injury will improve Outcome: Progressing   Problem: Education: Goal: Knowledge of disease or condition will improve Outcome: Progressing Goal: Knowledge of secondary prevention will improve Outcome: Progressing Goal: Knowledge of patient specific risk factors addressed and post discharge goals established will improve Outcome: Progressing Goal: Individualized Educational Video(s) Outcome: Progressing   Problem: Coping: Goal: Will verbalize positive feelings about self Outcome: Progressing   Problem: Health Behavior/Discharge Planning: Goal: Ability to manage health-related needs will improve Outcome: Progressing   Problem: Self-Care: Goal: Ability to participate in self-care as condition permits will improve Outcome: Progressing   Problem: Nutrition: Goal: Dietary intake will improve Outcome: Progressing   Problem: Ischemic Stroke/TIA Tissue Perfusion: Goal: Complications of ischemic stroke/TIA will be minimized Outcome: Progressing

## 2019-10-12 NOTE — Consult Note (Signed)
Physical Medicine and Rehabilitation Consult   Reason for Consult: Stroke with functional deficits.  Referring Physician: Dr. Sloan Leiter   HPI: John Douglas is a 64 y.o. male with history of HTN, Wide complex tachycardia/AF- no AC, T2DM with neuropathy, hematochezia with colonoscopy 06/09 positive for rectal adenocarcinoma who was admitted via [facility] on 10/08/19 with dizziness, several day history of weakness followed by LLE weakness with difficulty walking.  He was found to have A fib with RVR and CT head done revealing right PCA infarct with petechial hemorrhage. CTA head/neck showed normal intracranial and cervical arteries. Patient off Eliquis X 1 month-->on DAPT due to bloody stools. Dr. Leonie Man felt that stroke embolic due to A fib but questioned hypercoagulopathy from new dx CA. No DAPT due to petechial hemorrhage and AC with eliquis at d/c if no rectal bleeding noted.    Dr. Martha Clan consulted for input on new diagnosis rectal cancer and recommended pan CT as well as CEA for work up.  CT chest, abdomen/pelvis done for staging and showed retroperitoneal and upper pelvic lymphadenopathy, hyperenhancing focus left hepatic lobe with surrounding parenchymal attenuation on venous phase--felt to be hemangioma and mild distal circumferential esophageal thickening. MRI pelvis showed rectal adenocarcinoma T4 N2 with tumor 4 cm distance from anal sphincter. Work up underway and CCS consulted for input. Therapy evaluations completed revealing impulsivity with poor safety awareness, LLE weakness with decreased coordination and motor planning deficits. CIR recommended due to functional deficits.    Review of Systems  Constitutional: Negative.  Negative for chills, diaphoresis and fever.  HENT: Negative.  Negative for congestion and nosebleeds.   Eyes: Negative for pain, discharge and redness.  Respiratory: Negative for cough, hemoptysis, sputum production and stridor.   Cardiovascular: Negative for  chest pain and orthopnea.  Gastrointestinal: Positive for blood in stool. Negative for nausea and vomiting.  Genitourinary: Negative for dysuria and frequency.  Musculoskeletal: Negative for back pain.  Skin: Negative.  Negative for itching.  Neurological: Positive for weakness.  Endo/Heme/Allergies: Does not bruise/bleed easily.  Psychiatric/Behavioral: Negative for memory loss and substance abuse. The patient is not nervous/anxious.       Past Medical History:  Diagnosis Date  . Allergic rhinitis    [facility]  . Anxiety   . Atrial fibrillation ([place])    [facility]  . Benign essential hypertension    [facility]  . Coronary artery disease    [facility]  . Diabetic neuropathy, type II diabetes mellitus ([place])    [facility]  . Mixed hyperlipidemia    [facility]  . Myocardial infarct [facility])    [facility]  . Obesity, unspecified    [facility]  . Rectal bleeding    [facility]  . Wide-complex tachycardia ([place]) 06/04/2019    Past Surgical History:  Procedure Laterality Date  . BIOPSY  10/07/2019   Procedure: BIOPSY;  Surgeon: Lavena Bullion, DO;  Location: [facility];  Service: Gastroenterology;;  . COLONOSCOPY WITH PROPOFOL N/A 10/07/2019   Procedure: COLONOSCOPY WITH PROPOFOL;  Surgeon: Lavena Bullion, DO;  Location: [facility];  Service: Gastroenterology;  Laterality: N/A;  . CORONARY ANGIOPLASTY     3 stents  . LEFT HEART CATH AND CORONARY ANGIOGRAPHY N/A 06/05/2019   Procedure: LEFT HEART CATH AND CORONARY ANGIOGRAPHY;  Surgeon: Nelva Bush, MD;  Location: [facility];  Service: Cardiovascular;  Laterality: N/A;  . POLYPECTOMY  10/07/2019   Procedure: POLYPECTOMY;  Surgeon: Lavena Bullion, DO;  Location: [facility];  Service: Gastroenterology;;  . Lia Foyer TATTOO  INJECTION  10/07/2019   Procedure: SUBMUCOSAL TATTOO INJECTION;  Surgeon: Lavena Bullion, DO;  Location: [facility];  Service: Gastroenterology;;    Family History  Problem Relation Age of Onset  . Diverticulitis Mother   . Hypertension Father   . Heart disease Father        MI  . Prostate cancer Father   . Colon cancer Neg Hx   . Stomach cancer Neg Hx   . Pancreatic cancer Neg Hx   . Rectal cancer Neg Hx     Social History:  Divorced- lives alone and independent PTA. Retired--worked for [employer]. He reports that he has never smoked. He quit smokeless tobacco use about 5 months ago.  His smokeless tobacco use included chew. He reports current alcohol use. He reports current drug use. Drug: Marijuana.   Allergies  Allergen Reactions  . Ibuprofen Other (See Comments)    Was told to not take this   . Metformin And Related     Bloody stools   . Naproxen Other (See Comments)    Was told to not take this   . Penicillins Hives    Did it involve swelling of the face/tongue/throat, SOB, or low BP? Unk Did it involve sudden or severe rash/hives, skin peeling, or any reaction on the inside of your mouth or nose? Yes Did you need to seek medical attention at a hospital or doctor's office? Unk When did it last happen?"Childhood- 55 years ago" If all above answers are "NO", may proceed with cephalosporin use.   . [place] Venom [Bee Venom] Swelling and Other (See Comments)    Severe swelling where stung    Medications Prior to Admission  Medication Sig Dispense Refill  . aspirin 81 MG chewable tablet Chew 1 tablet (81 mg total) by mouth daily.    . clonazePAM (KLONOPIN) 1 MG disintegrating tablet Take 1 tablet (1 mg total) by mouth 2 (two) times daily as needed (pre-colonoscopy anxiety). 6 tablet 0  . clopidogrel (PLAVIX) 75 MG tablet Take 1 tablet (75 mg total) by mouth daily. 90 tablet 3  . ezetimibe (ZETIA) 10 MG tablet Take 10 mg by mouth at bedtime.     . fluticasone (FLONASE) 50 MCG/ACT nasal spray Place 1-2 sprays into both nostrils daily as needed for allergies or rhinitis (or seasonal allergies).    Marland Kitchen lisinopril (ZESTRIL) 2.5 MG tablet Take 2.5 mg by mouth daily.    . metoprolol tartrate (LOPRESSOR) 50 MG tablet Take 1.5 tablets (75 mg total) by mouth 2 (two) times daily. 270 tablet 3  . nitroGLYCERIN (NITROSTAT) 0.4 MG SL tablet Place 0.4 mg under the tongue every 5 (five) minutes as needed for chest pain.     . rosuvastatin (CRESTOR) 20 MG tablet Take 1 tablet (20 mg total) by mouth daily. 90 tablet 2    Home: Home Living Family/patient expects to be discharged to:: Private residence Living Arrangements: Alone Available Help at Discharge: Family, Available PRN/intermittently Type of Home: House Home Access: Stairs to enter Technical brewer of Steps: 1 Entrance Stairs-Rails: None Home Layout: One level Bathroom Shower/Tub: Chiropodist: Standard Bathroom Accessibility: Yes Home Equipment: None Additional Comments: family reports availability to cane and wc if needed  Lives With: Alone  Functional History: Prior Function Level of Independence: Independent Comments:  pt reports working, driving, and independence in all ADLs Functional Status:  Mobility: Bed Mobility Overal bed mobility: Needs Assistance Bed Mobility: Sit to Supine Supine to sit: Min assist Sit to supine: Supervision General bed mobility comments: supervision for safety Transfers Overall transfer level: Needs assistance Equipment used: Rolling walker (2 wheeled) Transfers: Sit to/from Stand Sit to Stand: Min assist General transfer comment: Pt unsafe with walker reuires verbal cues for safety and min A to steady Ambulation/Gait Ambulation/Gait assistance: Mod assist, Min assist Gait Distance (Feet): 45 Feet Assistive device: Rolling walker (2 wheeled), 1 person hand held assist Gait Pattern/deviations: Decreased step length -  left, Shuffle, Wide base of support, Trunk flexed General Gait Details: patient initially ambulated 5 feet with hand held assist. Leaning moderately on me for support. Rest of gait training he used RW. Noted to have left LE lagging, decreased foot clearance. Improved with cues to take exaggerated step on left. Patient with motor planing difficulties and poor coordination Gait velocity: decr    ADL: ADL Overall ADL's : Needs assistance/impaired Grooming: Oral care, Minimal assistance, With adaptive equipment, Standing Grooming Details (indicate cue type and reason): Min A for steadying at sink while standing  Upper Body Bathing: Supervision/ safety, Sitting Upper Body Bathing Details (indicate cue type and reason): supervision for safety Lower Body Bathing: Supervison/ safety, Sitting/lateral leans Lower Body Bathing Details (indicate cue type and reason): supervision for safety Upper Body Dressing : Supervision/safety, Sitting Upper Body Dressing Details (indicate cue type and reason): supervision for safety Lower Body Dressing: Minimal assistance, Sit to/from stand Lower Body Dressing Details (indicate cue type and reason): Min A to help with balance Toilet Transfer: Minimal assistance, Ambulation, Regular Toilet Toilet Transfer Details (indicate cue type and reason): Min A to steady upon standing Toileting- Clothing Manipulation and Hygiene: Minimal assistance Toileting - Clothing Manipulation Details (indicate cue type and reason): Min A to steady Tub/Shower Transfer Details (indicate cue type and reason): deferred assess next session Functional mobility during ADLs: Minimal assistance, Rolling walker, Cueing for safety General ADL Comments: Pt requires supervision when seated. Pt has decreased balance and is impoulsive and unsafe with walker. Needs verbal cues and min A to complete ADLs in standing.   Cognition: Cognition Overall Cognitive Status: Impaired/Different from  baseline Arousal/Alertness: Awake/alert Orientation Level: Oriented X4 Attention: Selective Selective Attention: Appears intact Memory: Appears intact Awareness: Impaired Awareness Impairment: Emergent impairment Problem Solving: Impaired Problem Solving Impairment: Functional complex Safety/Judgment: Impaired Cognition Arousal/Alertness: Awake/alert Behavior During Therapy: WFL for tasks assessed/performed, Impulsive Overall Cognitive Status: Impaired/Different from baseline Area of Impairment: Attention, Following commands, Safety/judgement, Problem solving Current Attention Level: Selective Following Commands: Follows one step commands consistently, Follows multi-step commands inconsistently Safety/Judgement: Decreased awareness of deficits, Decreased awareness of safety Problem Solving: Requires verbal cues General Comments: pt impulsive with walker and had decreased awareness of safety when ambulating   Blood pressure 138/74, pulse 64, temperature 98.9 F (37.2 C), temperature source Oral, resp. rate 17, height 5\' 10"  (1.778 m), weight 99.8 kg, SpO2 95 %. Physical Exam  Nursing note and vitals reviewed. Constitutional: He is oriented to person, place, and time.  HENT:  Head: Normocephalic and atraumatic.  Nose: Nose normal.  Mouth/Throat: Mucous membranes are moist.  Eyes: Pupils are equal, round, and reactive to light. Conjunctivae are normal.  Cardiovascular: Normal rate, regular rhythm and normal heart sounds.  No murmur heard. Respiratory: Effort normal and breath sounds normal. No stridor. No respiratory distress.  GI: Soft. Bowel sounds are normal. He  exhibits no distension and no mass.  Neurological: He is alert and oriented to person, place, and time.  Motor strength is 5/5 on the right deltoid bicep tricep grip hip flexion extensor ankle dorsiflexor 4+/5 in the left deltoid bicep tricep grip hip flexion extensor ankle dorsiflexor Cerebellar no evidence of  dysmetria finger-nose-finger testing or heel shin testing bilaterally Sensation intact light touch bilateral upper and lower limbs Tone is normal No evidence of visual field cut  Skin: Skin is warm and dry.  Psychiatric: Mood normal.    Results for orders placed or performed during the hospital encounter of 10/08/19 (from the past 24 hour(s))  Glucose, capillary     Status: Abnormal   Collection Time: 10/11/19 11:59 AM  Result Value Ref Range   Glucose-Capillary 151 (H) 70 - 99 mg/dL  Glucose, capillary     Status: Abnormal   Collection Time: 10/11/19  4:00 PM  Result Value Ref Range   Glucose-Capillary 170 (H) 70 - 99 mg/dL  Glucose, capillary     Status: Abnormal   Collection Time: 10/11/19  9:13 PM  Result Value Ref Range   Glucose-Capillary 166 (H) 70 - 99 mg/dL   Comment 1 Notify RN    Comment 2 Document in Chart   Glucose, capillary     Status: Abnormal   Collection Time: 10/12/19  5:48 AM  Result Value Ref Range   Glucose-Capillary 145 (H) 70 - 99 mg/dL   Comment 1 Notify RN    Comment 2 Document in Chart   Ferritin     Status: None   Collection Time: 10/12/19  8:05 AM  Result Value Ref Range   Ferritin 35 24 - 336 ng/mL  Iron and TIBC     Status: Abnormal   Collection Time: 10/12/19  8:05 AM  Result Value Ref Range   Iron 46 45 - 182 ug/dL   TIBC 343 250 - 450 ug/dL   Saturation Ratios 13 (L) 17.9 - 39.5 %   UIBC 297 ug/dL   MR PELVIS WO CONTRAST  Result Date: 10/10/2019 CLINICAL DATA:  Known rectal mass. EXAM: MRI PELVIS WITHOUT CONTRAST TECHNIQUE: Multiplanar multisequence MR imaging of the pelvis was performed. No intravenous contrast was administered. Small amount of [place] gel was administered per rectum to optimize tumor evaluation. COMPARISON:  CT chest and abdomen performed on 10/10/2019 FINDINGS: TUMOR LOCATION Tumor distance from Anal Verge/Skin Surface:  9 cm Tumor distance to Internal Anal Sphincter: 4 cm TUMOR DESCRIPTION Circumferential Extent: Completely  circumferential Tumor Length: 8.3 cm T - CATEGORY Extension through Muscularis Propria: Yes. Nodular extension beyond the colonic lumen approximately 7 mm. Shortest Distance of any tumor/node from Mesorectal Fascia: 0 mm tumor extending to the high mesorectum by way of extramural venous invasion and tumor deposits along the LEFTa mesorectum, high mesorectum posteriorly (image 6 of 32 and image 20 of 18 Extramural Vascular Invasion/Tumor Thrombus: Yes Invasion of Anterior Peritoneal Reflection: Yes nodular extension of tumor into the anterior peritoneal reflection best seen on image 14 of series 17. Involvement of Adjacent Organs or Pelvic Sidewall: No Levator Ani Involvement: No N - CATEGORY Mesorectal Lymph Nodes >=34mm: Greater than 4 abnormal lymph nodes within the mesorectum at least 7. Extra-mesorectal Lymphadenopathy: Yes, CT abdomen and pelvis CT with lymph nodes in the retroperitoneum. Other: Urinary bladder is unremarkable. Heterogeneity of the prostate. Small bilateral fat containing inguinal hernias. No acute bone finding to the extent the pelvis is visualized. No suspicious bone lesion. Contrast not administered for  rectal cancer staging IMPRESSION: Rectal adenocarcinoma T stage: T4 a Rectal adenocarcinoma N stage: N2, also with retroperitoneal, metastatic non regional lymph nodes. Distance from tumor to the internal anal sphincter is approximately 4 cm cm. Electronically Signed   By: Zetta Bills M.D.   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 Does the need for close, 24 hr/day medical supervision in concert with the patient's rehab needs make it unreasonable for this patient to be served in a less intensive setting? Yes 2. Co-Morbidities requiring supervision/potential complications: Rectal adenocarcinoma stage III or IV work-up pending, history of atrial fibrillation, history type 2 diabetes with neuropathy 3. Due to  bladder management, bowel management, safety, skin/wound care, disease management, medication administration, pain management and patient education, does the patient require 24 hr/day rehab nursing? Potentially 4. Does the patient require coordinated care of a physician, rehab nurse, therapy disciplines of PT, OT to address physical and functional deficits in the context of the above medical diagnosis(es)? Potentially Addressing deficits in the following areas: balance, endurance, locomotion, strength, transferring, bathing, dressing, toileting and psychosocial support 5. Can the patient actively participate in an intensive therapy program of at least 3 hrs of therapy per day at least 5 days per week? Yes 6. The potential for patient to make measurable gains while on inpatient rehab is excellent 7. Anticipated functional outcomes upon discharge from inpatient rehab are modified independent  with PT, modified independent with OT, modified independent with SLP. 8. Estimated rehab length of stay to reach the above functional goals is: 7d 9. Anticipated discharge destination: Home 10. Overall Rehab/Functional Prognosis: Excellent in the near term  RECOMMENDATIONS: This patient's condition is appropriate for continued rehabilitative care in the following setting: CIR if patient remains at a min assist level after oncology work-up completed Patient has agreed to participate in recommended program. Yes Note that insurance prior authorization may be required for reimbursement for recommended care.  Comment: Discussed that if chemotherapy is a treatment, this would not be performed while patient is at rehab.  Daughter at [place], PA-C 10/12/2019   "I have personally performed a face to face diagnostic evaluation of this patient.  Additionally, I have reviewed and concur with the physician assistant's documentation above." Charlett Blake M.D. [facility] FAAPM&R  (Neuromuscular Med) Diplomate Am Board of Electrodiagnostic Med Fellow Am Board of Interventional Pain

## 2019-10-12 NOTE — Progress Notes (Signed)
Physical Therapy Treatment Patient Details Name: John Douglas MRN: [id number] DOB: [birth year] Today's Date: 10/12/2019    History of Present Illness John Douglas is a 64 y.o. male with medical history significant of coronary artery disease status post stents, paroxysmal A. fib, hypertension, diabetes, hyperlipidemia, marijuana abuse, tobacco abuse, obesity, recent GI bleeding status post colonoscopy on 10/07/2019 presents to emergency department due to left leg weakness and difficulty walking. MRI positive for R PCA infarct.    PT Comments    Pt progressing towards goals. Continues to require min to mod A for gait. Continue to note ataxia and weakness in LLE. With increased distance pt becomes more unsteady and requiring increased assist. Worked on exercises to improve step height and coordination of LLE. Current recommendations appropriate. Will continue to follow acutely.     Follow Up Recommendations  CIR     Equipment Recommendations  Other (comment) (TBD)    Recommendations for Other Services Rehab consult     Precautions / Restrictions Precautions Precautions: Fall Restrictions Weight Bearing Restrictions: No    Mobility  Bed Mobility               General bed mobility comments: Sitting EOB upon entry   Transfers Overall transfer level: Needs assistance Equipment used: Rolling walker (2 wheeled) Transfers: Sit to/from Stand Sit to Stand: Min assist         General transfer comment: Min A for steadying. Cues for safe hand placement.   Ambulation/Gait Ambulation/Gait assistance: Mod assist;Min assist Gait Distance (Feet): 60 Feet Assistive device: Rolling walker (2 wheeled);1 person hand held assist Gait Pattern/deviations: Decreased step length - left;Shuffle;Wide base of support;Trunk flexed Gait velocity: Decreased   General Gait Details: Cues for quad activation in LLE and for increased step height. With increased distance, pt fatiguing and requiring  increased assist. Required safety cues for safe use of RW. Poor coordination noted in LLE.    Stairs             Wheelchair Mobility    Modified Rankin (Stroke Patients Only) Modified Rankin (Stroke Patients Only) Pre-Morbid Rankin Score: No symptoms Modified Rankin: Moderately severe disability     Balance Overall balance assessment: Needs assistance Sitting-balance support: Feet supported Sitting balance-Leahy Scale: Good     Standing balance support: Bilateral upper extremity supported;During functional activity Standing balance-Leahy Scale: Poor Standing balance comment: [place] BUE support and external assist.                             Cognition Arousal/Alertness: Awake/alert Behavior During Therapy: WFL for tasks assessed/performed;Impulsive Overall Cognitive Status: Impaired/Different from baseline Area of Impairment: Attention;Following commands;Safety/judgement;Problem solving                   Current Attention Level: Selective   Following Commands: Follows one step commands consistently;Follows multi-step commands inconsistently Safety/Judgement: Decreased awareness of deficits;Decreased awareness of safety   Problem Solving: Requires verbal cues General Comments: required safety cues with safe use of RW.       Exercises Other Exercises Other Exercises: Worked on marching X10 to work on balance, stance phase on LLE and increasing step height on LLE. Required min to mod A for steadying Other Exercises: Worked on stepping towards target X10 with LLE to work on [place] coordination. ataxic path noted with LLE.     General Comments        Pertinent Vitals/Pain Pain Assessment: No/denies pain    Home  Living                      Prior Function            PT Goals (current goals can now be found in the care plan section) Acute Rehab PT Goals Patient Stated Goal: to return to independent PT Goal Formulation: With  patient Time For Goal Achievement: 10/23/19 Potential to Achieve Goals: Good Progress towards PT goals: Progressing toward goals    Frequency    Min 4X/week      PT Plan Current plan remains appropriate    Co-evaluation              AM-PAC PT "6 Clicks" Mobility   Outcome Measure  Help needed turning from your back to your side while in a flat bed without using bedrails?: None Help needed moving from lying on your back to sitting on the side of a flat bed without using bedrails?: A Little Help needed moving to and from a bed to a chair (including a wheelchair)?: A Little Help needed standing up from a chair using your arms (e.g., wheelchair or bedside chair)?: A Little Help needed to walk in hospital room?: A Lot Help needed climbing 3-5 steps with a railing? : A Lot 6 Click Score: 17    End of Session Equipment Utilized During Treatment: Gait belt Activity Tolerance: Patient tolerated treatment well Patient left: in bed;with call bell/phone within reach;with bed alarm set (sitting at EOB ) Nurse Communication: Mobility status PT Visit Diagnosis: Unsteadiness on feet (R26.81);Other abnormalities of gait and mobility (R26.89);Hemiplegia and hemiparesis;Difficulty in walking, not elsewhere classified (R26.2) Hemiplegia - Right/Left: Left Hemiplegia - dominant/non-dominant: Non-dominant Hemiplegia - caused by: Cerebral infarction     Time: 1210-1226 PT Time Calculation (min) (ACUTE ONLY): 16 min  Charges:  $Gait Training: 8-22 mins                     Lou Miner, DPT  Acute Rehabilitation Services  Pager: [phone] Office: [phone]    Rudean Hitt 10/12/2019, 6:25 PM

## 2019-10-12 NOTE — Progress Notes (Signed)
PROGRESS NOTE    John Douglas  JGG:[id number] DOB: 1955/08/13 DOA: 10/08/2019 PCP: Leonides Sake, MD    Brief Narrative:  64 year old gentleman with extensive cardiovascular problems.  STEMI and PCI to RCA in [id number] complicated by sustained ventricular tachycardia .  Aspirin and Brilinta Non-STEMI and subsequent PCI to LAD on 06/2019 along with paroxysmal A. fib.  Aspirin and Plavix.  Anticoagulation deferred because of ongoing GI evaluation.  He was having intermittent hematochezia more than 6 months ago.  He attributed that to Metformin. Never had colonoscopy.  Because of STEMI and LAD stent, waited 3 months of dual antiplatelet therapy to get a colonoscopy.  6/9, Plavix held for 1 week and continued on aspirin, underwent colonoscopy, found to have adenocarcinoma of the colon with partially obstructive lesion. Since the day of colonoscopy or the night before only he was having some weakness, dragging his left foot, dizzy and disoriented.  He reported the symptoms while he was taking bowel prep that was thought to be from bowel prep.  6/10, went back to [facility] ER.  Found to be in A. fib with RVR heart rate 150.  CT head showed right PCA distribution infarct and transferred to [facility].   Assessment & Plan:   Principal Problem:   Ischemic stroke [facility]) Active Problems:   Paroxysmal atrial fibrillation (HCC)   Essential hypertension   Hyperlipidemia LDL goal <70   Coronary artery disease   Ischemic cardiomyopathy   Leukocytosis   Hypokalemia   Colon cancer (HCC)  Right PCA territory ischemic infarct with petechial hemorrhagic conversion: With history of paroxysmal A. Fib Clinical findings, dizziness, lightheadedness and confusion, left-sided weakness. CT head findings, right PCA territory ischemic infarct. MRI of the brain, acute infarct right PCA territory, posterior limb of internal capsule.  Petechial blood within the posterior medial right temporal lobe.  No  mass-effect. CTA of the head and neck, no large vessel occlusion. 2D echocardiogram,normal. Echocardiogram 06/2019 with ejection fraction 45%. Antiplatelet therapy, aspirin at home continued.  Plavix on hold for the last 7 days PTA, continue to hold because of petechial hemorrhage.  Patient will probably need anticoagulation once work-up completed. LDL 98.  Last LDL 32 on 4/30.  On rosuvastatin and Zetia.  Continue. Hemoglobin A1c 8.  Not on treatment.  Was on Metformin in the past so he must be diagnosed with diabetes. Continue to work with PT OT.  Appreciate neurology input.  Stable to transfer to CIR.  Paroxysmal atrial fibrillation:  Please in sinus rhythm today.  Resumed metoprolol.  Anticoagulation on hold.  Rate controlled.  Coronary artery disease, ST elevation MI 03/2019, non-STEMI 05/2018 with VT:  Currently stable.  Without chest pain. Patient metoprolol and lisinopril.  On aspirin.  Plavix on hold.  Nitroglycerin as needed.  Hematochezia/recently diagnosed adenocarcinoma of the colon:  New diagnosed metastatic adenocarcinoma.  Followed by oncology.   Staging MR and Ct chest / abdomen completed. Followed by Dr Marin Olp   Hypokalemia:  Replaced.   DVT prophylaxis: SCD Code Status: Full code Family Communication: Patient's girlfriend at the bedside. Disposition Plan: Status is: Inpatient  Remains inpatient appropriate because: Stabilizing.  Anticipating transfer to rehab with oncology follow-up for neoadjuvant therapy.   Dispo: The patient is from: Home              Anticipated d/c is to: Inpatient rehab versus home.              Anticipated d/c date is: Medically stable.  Patient currently medically stable to transfer to rehab.         Consultants:   Oncology  Neurology  Procedures:   None  Antimicrobials:   None   Subjective: Patient seen and examined.  No overnight events.  Able to walk with support.  Ready for rehab.  Objective:  Vitals:   10/11/19 1958 10/11/19 2346 10/12/19 0400 10/12/19 0728  BP: (!) 143/80 (!) 141/80 (!) 156/92 138/74  Pulse: 70 (!) 59 63 64  Resp: 18 18 18 17   Temp: 99.1 F (37.3 C) 98.7 F (37.1 C) 98.5 F (36.9 C) 98.9 F (37.2 C)  TempSrc: Oral Oral Oral Oral  SpO2: 97% 99% 98% 95%  Weight:      Height:       No intake or output data in the 24 hours ending 10/12/19 0950 Filed Weights   10/08/19 1122  Weight: 99.8 kg    Examination:  Physical Exam HENT:     Head: Normocephalic.  Eyes:     Pupils: Pupils are equal, round, and reactive to light.  Cardiovascular:     Rate and Rhythm: Normal rate and regular rhythm.  Pulmonary:     Breath sounds: Normal breath sounds.  Abdominal:     Palpations: Abdomen is soft.  Neurological:     Mental Status: He is alert and oriented to person, place, and time.     Comments: Cranial nerves no deficit. Mild weakness on the left lower extremity, drift present.  Able to elevate against gravity.      Data Reviewed: I have personally reviewed following labs and imaging studies  CBC: Recent Labs  Lab 10/07/19 1042 10/08/19 1128 10/09/19 0500  WBC 8.5 12.3* 8.8  NEUTROABS 6.3  --  6.3  HGB 12.5* 12.2* 12.0*  HCT 37.9* 38.1* 37.0*  MCV 82.8 85.8 84.7  PLT 252 256 295   Basic Metabolic Panel: Recent Labs  Lab 10/07/19 1042 10/08/19 1128 10/09/19 0500  NA 141 137 140  K 4.0 3.4* 3.2*  CL 105 106 109  CO2 26 19* 22  GLUCOSE 167* 194* 155*  BUN 16 8 10   CREATININE 0.87 0.90 0.94  CALCIUM 8.9 8.2* 8.6*  MG  --  1.8  --    GFR: Estimated Creatinine Clearance: 92.8 mL/min (by C-G formula based on SCr of 0.94 mg/dL). Liver Function Tests: Recent Labs  Lab 10/07/19 1042  AST 16  ALT 16  ALKPHOS 56  BILITOT 0.8  PROT 6.7  ALBUMIN 3.9   No results for input(s): LIPASE, AMYLASE in the last 168 hours. No results for input(s): AMMONIA in the last 168 hours. Coagulation Profile: No results for input(s): INR, PROTIME in  the last 168 hours. Cardiac Enzymes: No results for input(s): CKTOTAL, CKMB, CKMBINDEX, TROPONINI in the last 168 hours. BNP (last 3 results) No results for input(s): PROBNP in the last 8760 hours. HbA1C: No results for input(s): HGBA1C in the last 72 hours. CBG: Recent Labs  Lab 10/11/19 0553 10/11/19 1159 10/11/19 1600 10/11/19 2113 10/12/19 0548  GLUCAP 136* 151* 170* 166* 145*   Lipid Profile: No results for input(s): CHOL, HDL, LDLCALC, TRIG, CHOLHDL, LDLDIRECT in the last 72 hours. Thyroid Function Tests: No results for input(s): TSH, T4TOTAL, FREET4, T3FREE, THYROIDAB in the last 72 hours. Anemia Panel: Recent Labs    10/12/19 0805  FERRITIN 35  TIBC 343  IRON 46   Sepsis Labs: No results for input(s): PROCALCITON, LATICACIDVEN in the last 168 hours.  Recent Results (  from the past 240 hour(s))  SARS CORONAVIRUS 2 (TAT 6-24 HRS) Nasopharyngeal Nasopharyngeal Swab     Status: None   Collection Time: 10/03/19  1:23 PM   Specimen: Nasopharyngeal Swab  Result Value Ref Range Status   SARS Coronavirus 2 NEGATIVE NEGATIVE Final    Comment: (NOTE) SARS-CoV-2 target nucleic acids are NOT DETECTED. The SARS-CoV-2 RNA is generally detectable in upper and lower respiratory specimens during the acute phase of infection. Negative results do not preclude SARS-CoV-2 infection, do not rule out co-infections with other pathogens, and should not be used as the sole basis for treatment or other patient management decisions. Negative results must be combined with clinical observations, patient history, and epidemiological information. The expected result is Negative. Fact Sheet for Patients: SugarRoll.be Fact Sheet for Healthcare Providers: https://www.woods-mathews.com/ This test is not yet approved or cleared by the [place] FDA and  has been authorized for detection and/or diagnosis of SARS-CoV-2 by FDA under an Emergency Use  Authorization (EUA). This EUA will remain  in effect (meaning this test can be used) for the duration of the COVID-19 declaration under Section 56 4(b)(1) of the Act, 21 U.S.C. section 360bbb-3(b)(1), unless the authorization is terminated or revoked sooner. Performed at [facility], [place] [address], [place], [place] [address]   SARS Coronavirus 2 by RT PCR (hospital order, performed in [facility] hospital lab) Nasopharyngeal Nasopharyngeal Swab     Status: None   Collection Time: 10/08/19  5:40 PM   Specimen: Nasopharyngeal Swab  Result Value Ref Range Status   SARS Coronavirus 2 NEGATIVE NEGATIVE Final    Comment: (NOTE) SARS-CoV-2 target nucleic acids are NOT DETECTED.  The SARS-CoV-2 RNA is generally detectable in upper and lower respiratory specimens during the acute phase of infection. The lowest concentration of SARS-CoV-2 viral copies this assay can detect is 250 copies / mL. A negative result does not preclude SARS-CoV-2 infection and should not be used as the sole basis for treatment or other patient management decisions.  A negative result may occur with improper specimen collection / handling, submission of specimen other than nasopharyngeal swab, presence of viral mutation(s) within the areas targeted by this assay, and inadequate number of viral copies (<250 copies / mL). A negative result must be combined with clinical observations, patient history, and epidemiological information.  Fact Sheet for Patients:   StrictlyIdeas.no  Fact Sheet for Healthcare Providers: BankingDealers.co.za  This test is not yet approved or  cleared by the [place] FDA and has been authorized for detection and/or diagnosis of SARS-CoV-2 by FDA under an Emergency Use Authorization (EUA).  This EUA will remain in effect (meaning this test can be used) for the duration of the COVID-19 declaration under Section 564(b)(1) of the Act,  21 U.S.C. section 360bbb-3(b)(1), unless the authorization is terminated or revoked sooner.  Performed at [facility], [place] [address] [place], [place] [address]          Radiology Studies: MR PELVIS WO CONTRAST  Result Date: 10/10/2019 CLINICAL DATA:  Known rectal mass. EXAM: MRI PELVIS WITHOUT CONTRAST TECHNIQUE: Multiplanar multisequence MR imaging of the pelvis was performed. No intravenous contrast was administered. Small amount of [place] gel was administered per rectum to optimize tumor evaluation. COMPARISON:  CT chest and abdomen performed on 10/10/2019 FINDINGS: TUMOR LOCATION Tumor distance from Anal Verge/Skin Surface:  9 cm Tumor distance to Internal Anal Sphincter: 4 cm TUMOR DESCRIPTION Circumferential Extent: Completely circumferential Tumor Length: 8.3 cm T -  CATEGORY Extension through Muscularis Propria: Yes. Nodular extension beyond the colonic lumen approximately 7 mm. Shortest Distance of any tumor/node from Mesorectal Fascia: 0 mm tumor extending to the high mesorectum by way of extramural venous invasion and tumor deposits along the LEFTa mesorectum, high mesorectum posteriorly (image 6 of 32 and image 20 of 18 Extramural Vascular Invasion/Tumor Thrombus: Yes Invasion of Anterior Peritoneal Reflection: Yes nodular extension of tumor into the anterior peritoneal reflection best seen on image 14 of series 17. Involvement of Adjacent Organs or Pelvic Sidewall: No Levator Ani Involvement: No N - CATEGORY Mesorectal Lymph Nodes >=71mm: Greater than 4 abnormal lymph nodes within the mesorectum at least 7. Extra-mesorectal Lymphadenopathy: Yes, CT abdomen and pelvis CT with lymph nodes in the retroperitoneum. Other: Urinary bladder is unremarkable. Heterogeneity of the prostate. Small bilateral fat containing inguinal hernias. No acute bone finding to the extent the pelvis is visualized. No suspicious bone lesion. Contrast not administered for rectal cancer staging IMPRESSION: Rectal  adenocarcinoma T stage: T4 a Rectal adenocarcinoma N stage: N2, also with retroperitoneal, metastatic non regional lymph nodes. Distance from tumor to the internal anal sphincter is approximately 4 cm cm. Electronically Signed   By: Zetta Bills M.D.   On: 10/10/2019 18:15        Scheduled Meds: . aspirin  81 mg Oral Daily  . ezetimibe  10 mg Oral QHS  . insulin aspart  0-5 Units Subcutaneous QHS  . insulin aspart  0-9 Units Subcutaneous TID WC  . lisinopril  2.5 mg Oral Daily  . metoprolol tartrate  75 mg Oral BID  . rosuvastatin  20 mg Oral Daily   Continuous Infusions:   LOS: 4 days    Time spent: 30 minutes    Barb Merino, MD [facility] Pager [phone]

## 2019-10-13 LAB — GLUCOSE, CAPILLARY
Glucose-Capillary: 153 mg/dL — ABNORMAL HIGH (ref 70–99)
Glucose-Capillary: 156 mg/dL — ABNORMAL HIGH (ref 70–99)
Glucose-Capillary: 132 mg/dL — ABNORMAL HIGH (ref 70–99)
Glucose-Capillary: 191 mg/dL — ABNORMAL HIGH (ref 70–99)

## 2019-10-13 LAB — SURGICAL PATHOLOGY

## 2019-10-13 NOTE — Plan of Care (Signed)
  Problem: Education: Goal: Knowledge of General Education information will improve Description: Including pain rating scale, medication(s)/side effects and non-pharmacologic comfort measures Outcome: Progressing   Problem: Health Behavior/Discharge Planning: Goal: Ability to manage health-related needs will improve Outcome: Progressing   Problem: Clinical Measurements: Goal: Ability to maintain clinical measurements within normal limits will improve Outcome: Progressing Goal: Diagnostic test results will improve Outcome: Progressing   Problem: Activity: Goal: Risk for activity intolerance will decrease Outcome: Progressing   Problem: Nutrition: Goal: Adequate nutrition will be maintained Outcome: Progressing   Problem: Coping: Goal: Level of anxiety will decrease Outcome: Progressing   Problem: Safety: Goal: Ability to remain free from injury will improve Outcome: Progressing   Problem: Education: Goal: Knowledge of disease or condition will improve Outcome: Progressing Goal: Knowledge of secondary prevention will improve Outcome: Progressing Goal: Knowledge of patient specific risk factors addressed and post discharge goals established will improve Outcome: Progressing Goal: Individualized Educational Video(s) Outcome: Progressing   Problem: Coping: Goal: Will verbalize positive feelings about self Outcome: Progressing   Problem: Health Behavior/Discharge Planning: Goal: Ability to manage health-related needs will improve Outcome: Progressing   Problem: Self-Care: Goal: Ability to participate in self-care as condition permits will improve Outcome: Progressing   Problem: Nutrition: Goal: Dietary intake will improve Outcome: Progressing   Problem: Ischemic Stroke/TIA Tissue Perfusion: Goal: Complications of ischemic stroke/TIA will be minimized Outcome: Progressing

## 2019-10-13 NOTE — Progress Notes (Signed)
Occupational Therapy Treatment Patient Details Name: John Douglas MRN: [id number] DOB: 16-Apr-1956 Today's Date: 10/13/2019    History of present illness John Douglas is a 64 y.o. male with medical history significant of coronary artery disease status post stents, paroxysmal A. fib, hypertension, diabetes, hyperlipidemia, marijuana abuse, tobacco abuse, obesity, recent GI bleeding status post colonoscopy on 10/07/2019 presents to emergency department due to left leg weakness and difficulty walking. MRI positive for R PCA infarct.   OT comments  Upon arrival pt laying in bed and agreeable to OT. Pt participated in visual/cognitive testing done while sitting EOB. Administered Trail Making Tests (TMT) Part A&B, as well as additional testing for saccades, tracing, scanning. Increaded time and trials needed to complete TMT (Part A: 108 seconds and Part B T1: >27mins with errors, Part B T2: 89mins and 58sec with verbal cues to complete) (Normal time for Trial A: 29 seconds and Trial B: 75 seconds) and deficits seen with visual scanning, attention, short-term memory, and sequencing. Results discussed with pt and pt agreeable that those deficits are important and should be addressed. Pt remains a good candidate for CIR level therapies to maximize independence and safety with ADLs and IADLs.    Follow Up Recommendations  CIR    Equipment Recommendations  Other (comment) (TBD at next venue of care)    Recommendations for Other Services Rehab consult    Precautions / Restrictions Precautions Precautions: Fall Restrictions Weight Bearing Restrictions: No       Mobility Bed Mobility Overal bed mobility: Needs Assistance Bed Mobility: Supine to Sit;Sit to Supine     Supine to sit: Supervision Sit to supine: Supervision   General bed mobility comments: supervision for safety. Pt quickly sat up at EOB therapist reminded to slow down and be mindful to not hurt himself.        ADL either  performed or assessed with clinical judgement        Vision   Vision Assessment?: Yes Eye Alignment: Within Functional Limits Ocular Range of Motion: Within Functional Limits Alignment/Gaze Preference: Within Defined Limits Saccades: Within functional limits Visual Fields: Other (comment) (increased time to scan visual fields in TMT) Additional Comments: Pt requires increased time to scan for objects in superior and L fields during TMT testing   Perception     Praxis      Cognition Arousal/Alertness: Awake/alert Behavior During Therapy: WFL for tasks assessed/performed Overall Cognitive Status: Impaired/Different from baseline Area of Impairment: Attention;Memory;Following commands;Safety/judgement;Awareness                   Current Attention Level: Selective Memory: Decreased short-term memory Following Commands: Follows one step commands consistently;Follows multi-step commands inconsistently Safety/Judgement: Decreased awareness of deficits Awareness: Emergent Problem Solving: Difficulty sequencing;Slow processing;Requires verbal cues General Comments: Conducted Trail Making Test (TMT) parts A & B and pt presented deficits in STM, decreased ability to sequence, decreased ability to follow multiple step commands, and decreased awareness of deficits.        Exercises     Shoulder Instructions       General Comments Administered Trail Making Test Parts A & B, as well as additional testing for saccades, tracing, scanning. Increaded time and trials needed to complete TMT (Part A: 108 seconds and Part B T1: >5mins with errors, Part B T2: 38mins and 58sec) and deficits seen with visual scanning and attention. Pt unaware of cognitive and visual deficits and pt demonstrated understanding that it is important to exercise brain and not just body  with CVA.    Pertinent Vitals/ Pain       Pain Assessment: No/denies pain  Home Living                                           Prior Functioning/Environment              Frequency  Min 2X/week        Progress Toward Goals  OT Goals(current goals can now be found in the care plan section)  Progress towards OT goals: Progressing toward goals  Acute Rehab OT Goals Patient Stated Goal: to return to independent OT Goal Formulation: With patient/family Time For Goal Achievement: 10/23/19 Potential to Achieve Goals: Good  Plan Discharge plan remains appropriate    Co-evaluation                 AM-PAC OT "6 Clicks" Daily Activity     Outcome Measure   Help from another person eating meals?: None Help from another person taking care of personal grooming?: A Little Help from another person toileting, which includes using toliet, bedpan, or urinal?: A Little Help from another person bathing (including washing, rinsing, drying)?: A Little Help from another person to put on and taking off regular upper body clothing?: A Little Help from another person to put on and taking off regular lower body clothing?: A Little 6 Click Score: 19    End of Session    OT Visit Diagnosis: Ataxia, unspecified (R27.0);Other symptoms and signs involving the nervous system (R29.898);Other symptoms and signs involving cognitive function   Activity Tolerance Patient tolerated treatment well   Patient Left in bed;with call bell/phone within reach;with bed alarm set   Nurse Communication Mobility status        Time: 1341-1413 OT Time Calculation (min): 32 min  Charges: OT General Charges $OT Visit: 1 Visit OT Treatments $Therapeutic Activity: 8-22 mins $Cognitive Funtion inital: Initial 15 mins  Amos Gaber/OTS   Arney Mayabb 10/13/2019, 2:46 PM

## 2019-10-13 NOTE — Progress Notes (Signed)
Inpatient Rehabilitation-Admissions Coordinator   Met with pt in the room as follow up from PM&R MD consult. (please see consult note from Dr. Letta Pate on 6/14 for details). Discussed anticipated LOS and expected functional outcomes. Reviewed expectations and details of the program. Answered all questions and confirmed continued interest. Insurance case was opened Monday, 6/14 for possible admit. Will update once there has been a determination.   Raechel Ache, OTR/L  Rehab Admissions Coordinator  [phone] 10/13/2019 10:32 AM

## 2019-10-13 NOTE — Progress Notes (Signed)
PROGRESS NOTE    Makhi Muzquiz  CBJ:[id number] DOB: 1955-08-02 DOA: 10/08/2019 PCP: Leonides Sake, MD    Brief Narrative:  64 year old gentleman with extensive cardiovascular problems.  STEMI and PCI to RCA in 16/0737 complicated by sustained ventricular tachycardia .  Aspirin and Brilinta Non-STEMI and subsequent PCI to LAD on 06/2019 along with paroxysmal A. fib.  Aspirin and Plavix.  Anticoagulation deferred because of ongoing GI evaluation. He was having intermittent hematochezia more than 6 months ago.  He attributed that to Metformin. Never had colonoscopy.  Because of STEMI and LAD stent, waited 3 months of dual antiplatelet therapy to get a colonoscopy.  6/9, Plavix held for 1 week and continued on aspirin, underwent colonoscopy, found to have adenocarcinoma of the colon with partially obstructive lesion. Since the day of colonoscopy or the night before only he was having some weakness, dragging his left foot, dizzy and disoriented.  He reported the symptoms while he was taking bowel prep that was thought to be from bowel prep.  6/10, went back to [facility] ER.  Found to be in A. fib with RVR heart rate 150.  CT head showed right PCA distribution infarct and transferred to [facility].   Assessment & Plan:   Principal Problem:   Ischemic stroke [facility] Active Problems:   Paroxysmal atrial fibrillation (HCC)   Essential hypertension   Hyperlipidemia LDL goal <70   Coronary artery disease   Ischemic cardiomyopathy   Leukocytosis   Hypokalemia   Colon cancer (HCC)  Right PCA territory ischemic infarct with petechial hemorrhagic conversion: With history of paroxysmal A. Fib Clinical findings, dizziness, lightheadedness and confusion, left leg weakness. CT head findings, right PCA territory ischemic infarct. MRI of the brain, acute infarct right PCA territory, posterior limb of internal capsule.  Petechial blood within the posterior medial right temporal lobe.  No  mass-effect. CTA of the head and neck, no large vessel occlusion. 2D echocardiogram,normal. Echocardiogram 06/2019 with ejection fraction 45%. Antiplatelet therapy, aspirin at home continued.  Plavix on hold.  LDL 98.  Last LDL 32 on 4/30.  On rosuvastatin and Zetia.  Continue. Hemoglobin A1c 8.  Not on treatment.  Was on Metformin in the past so he must be diagnosed with diabetes. Continue to work with PT OT.  Appreciate neurology input.  Stable to transfer to CIR.  Paroxysmal atrial fibrillation:  On sinus rhythm.  On metoprolol.  Anticoagulation on hold.  Coronary artery disease, ST elevation MI 03/2019, non-STEMI 05/2018 with VT:  Currently stable.  Without chest pain. Patient metoprolol and lisinopril.  On aspirin.  Plavix on hold.  Nitroglycerin as needed.  Hematochezia/recently diagnosed adenocarcinoma of the colon:  New diagnosed metastatic adenocarcinoma.  Followed by oncology.   Staging MR and Ct chest / abdomen completed.  Followed by Dr Marin Olp  Further plan as per oncology.  Hypokalemia:  Replaced.  Case discussed with oncology and surgery.  Surgery recommended outpatient follow-up and probably not a surgical candidate.  Dr. Marin Olp is following.  I discussed with him about plan for port placement.  If patient is to have a port placed, will hold off on restarting Plavix and Eliquis.  DVT prophylaxis: SCD Code Status: Full code Family Communication: Patient's daughter at the bedside. Disposition Plan: Status is: Inpatient  Remains inpatient appropriate because: Stabilizing.  Anticipating transfer to rehab with oncology follow-up for neoadjuvant therapy.   Dispo: The patient is from: Home              Anticipated  d/c is to: Inpatient rehab.              Anticipated d/c date is: Medically stable.              Patient currently medically stable to transfer to rehab.         Consultants:   Oncology  Neurology  Procedures:   None  Antimicrobials:    None   Subjective: Patient seen and examined.  No overnight events.  Daughter at bedside asked me about chemotherapy, treatment options and whether he has definitive treatments. I explained to them that his cancer is a stage IV, will defer to oncology about treatment option discussions and outcomes.   Objective: Vitals:   10/12/19 2321 10/13/19 0337 10/13/19 0812 10/13/19 1144  BP: (!) 145/80 (!) 141/75 126/67 137/81  Pulse: 76 65 74 (!) 59  Resp: 17 17 18 20   Temp: 98.4 F (36.9 C) 98.5 F (36.9 C) 98.2 F (36.8 C) 99.2 F (37.3 C)  TempSrc: Oral Oral Oral Oral  SpO2: 98% 99% 96% 99%  Weight:      Height:        Intake/Output Summary (Last 24 hours) at 10/13/2019 1319 Last data filed at 10/12/2019 1700 Gross per 24 hour  Intake 500 ml  Output --  Net 500 ml   Filed Weights   10/08/19 1122  Weight: 99.8 kg    Examination:  Physical Exam HENT:     Head: Normocephalic.  Eyes:     Pupils: Pupils are equal, round, and reactive to light.  Cardiovascular:     Rate and Rhythm: Normal rate and regular rhythm.  Pulmonary:     Breath sounds: Normal breath sounds.  Abdominal:     Palpations: Abdomen is soft.  Neurological:     Mental Status: He is alert and oriented to person, place, and time.     Comments: Cranial nerves no deficit. Mild weakness on the left lower extremity, drift present.  Able to elevate against gravity.   Patient is walking in the hallway with therapist, he has definite weakness on the left side.  using assistive device.   Data Reviewed: I have personally reviewed following labs and imaging studies  CBC: Recent Labs  Lab 10/07/19 1042 10/08/19 1128 10/09/19 0500  WBC 8.5 12.3* 8.8  NEUTROABS 6.3  --  6.3  HGB 12.5* 12.2* 12.0*  HCT 37.9* 38.1* 37.0*  MCV 82.8 85.8 84.7  PLT 252 256 973   Basic Metabolic Panel: Recent Labs  Lab 10/07/19 1042 10/08/19 1128 10/09/19 0500  NA 141 137 140  K 4.0 3.4* 3.2*  CL 105 106 109  CO2 26  19* 22  GLUCOSE 167* 194* 155*  BUN 16 8 10   CREATININE 0.87 0.90 0.94  CALCIUM 8.9 8.2* 8.6*  MG  --  1.8  --    GFR: Estimated Creatinine Clearance: 92.8 mL/min (by C-G formula based on SCr of 0.94 mg/dL). Liver Function Tests: Recent Labs  Lab 10/07/19 1042  AST 16  ALT 16  ALKPHOS 56  BILITOT 0.8  PROT 6.7  ALBUMIN 3.9   No results for input(s): LIPASE, AMYLASE in the last 168 hours. No results for input(s): AMMONIA in the last 168 hours. Coagulation Profile: No results for input(s): INR, PROTIME in the last 168 hours. Cardiac Enzymes: No results for input(s): CKTOTAL, CKMB, CKMBINDEX, TROPONINI in the last 168 hours. BNP (last 3 results) No results for input(s): PROBNP in the last 8760 hours. HbA1C: No  results for input(s): HGBA1C in the last 72 hours. CBG: Recent Labs  Lab 10/12/19 1153 10/12/19 1508 10/12/19 2104 10/13/19 0600 10/13/19 1148  GLUCAP 158* 116* 132* 153* 156*   Lipid Profile: No results for input(s): CHOL, HDL, LDLCALC, TRIG, CHOLHDL, LDLDIRECT in the last 72 hours. Thyroid Function Tests: No results for input(s): TSH, T4TOTAL, FREET4, T3FREE, THYROIDAB in the last 72 hours. Anemia Panel: Recent Labs    10/12/19 0805  FERRITIN 35  TIBC 343  IRON 46   Sepsis Labs: No results for input(s): PROCALCITON, LATICACIDVEN in the last 168 hours.  Recent Results (from the past 240 hour(s))  SARS CORONAVIRUS 2 (TAT 6-24 HRS) Nasopharyngeal Nasopharyngeal Swab     Status: None   Collection Time: 10/03/19  1:23 PM   Specimen: Nasopharyngeal Swab  Result Value Ref Range Status   SARS Coronavirus 2 NEGATIVE NEGATIVE Final    Comment: (NOTE) SARS-CoV-2 target nucleic acids are NOT DETECTED. The SARS-CoV-2 RNA is generally detectable in upper and lower respiratory specimens during the acute phase of infection. Negative results do not preclude SARS-CoV-2 infection, do not rule out co-infections with other pathogens, and should not be used as  the sole basis for treatment or other patient management decisions. Negative results must be combined with clinical observations, patient history, and epidemiological information. The expected result is Negative. Fact Sheet for Patients: SugarRoll.be Fact Sheet for Healthcare Providers: https://www.woods-mathews.com/ This test is not yet approved or cleared by the [place] FDA and  has been authorized for detection and/or diagnosis of SARS-CoV-2 by FDA under an Emergency Use Authorization (EUA). This EUA will remain  in effect (meaning this test can be used) for the duration of the COVID-19 declaration under Section 56 4(b)(1) of the Act, 21 U.S.C. section 360bbb-3(b)(1), unless the authorization is terminated or revoked sooner. Performed at [facility], [place] [address], [place], [place] [address]   SARS Coronavirus 2 by RT PCR (hospital order, performed in [facility] hospital lab) Nasopharyngeal Nasopharyngeal Swab     Status: None   Collection Time: 10/08/19  5:40 PM   Specimen: Nasopharyngeal Swab  Result Value Ref Range Status   SARS Coronavirus 2 NEGATIVE NEGATIVE Final    Comment: (NOTE) SARS-CoV-2 target nucleic acids are NOT DETECTED.  The SARS-CoV-2 RNA is generally detectable in upper and lower respiratory specimens during the acute phase of infection. The lowest concentration of SARS-CoV-2 viral copies this assay can detect is 250 copies / mL. A negative result does not preclude SARS-CoV-2 infection and should not be used as the sole basis for treatment or other patient management decisions.  A negative result may occur with improper specimen collection / handling, submission of specimen other than nasopharyngeal swab, presence of viral mutation(s) within the areas targeted by this assay, and inadequate number of viral copies (<250 copies / mL). A negative result must be combined with clinical observations, patient  history, and epidemiological information.  Fact Sheet for Patients:   StrictlyIdeas.no  Fact Sheet for Healthcare Providers: BankingDealers.co.za  This test is not yet approved or  cleared by the [place] FDA and has been authorized for detection and/or diagnosis of SARS-CoV-2 by FDA under an Emergency Use Authorization (EUA).  This EUA will remain in effect (meaning this test can be used) for the duration of the COVID-19 declaration under Section 564(b)(1) of the Act, 21 U.S.C. section 360bbb-3(b)(1), unless the authorization is terminated or revoked sooner.  Performed at [facility], [place]  [address], [place], [place] [address]          Radiology Studies: MR LIVER W WO CONTRAST  Result Date: 10/12/2019 CLINICAL DATA:  Rectal cancer.  Liver lesion on CT. EXAM: MRI ABDOMEN WITHOUT AND WITH CONTRAST TECHNIQUE: Multiplanar multisequence MR imaging of the abdomen was performed both before and after the administration of intravenous contrast. CONTRAST:  65mL GADAVIST GADOBUTROL 1 MMOL/ML IV SOLN COMPARISON:  CT abdomen dated 10/10/2019 FINDINGS: Motion degraded images. Lower chest: Lung bases are clear. Hepatobiliary: 16 x 10 mm T2 hyperintense lesion along the posterior aspect of segment 2 (series 5/image 13), with avid arterial enhancement (series 802/image 45), without washout. This appearance favors a flash filling hemangioma. No morphologic findings of cirrhosis. No hepatic steatosis. Gallbladder is unremarkable. No intrahepatic or extrahepatic ductal dilatation. Pancreas:  Within normal limits. Spleen:  Within normal limits. Adrenals/Urinary Tract:  Adrenal glands are within normal limits. Kidneys are within normal limits.  No hydronephrosis. Stomach/Bowel: Stomach is within normal limits. Visualized bowel is unremarkable. Vascular/Lymphatic:  No evidence of abdominal aortic aneurysm. Para-aortic lymph nodes measuring up to 10 mm  short axis (series 801/image 96), better evaluated on recent CT. Other:  No abdominal ascites. Musculoskeletal: No focal osseous lesions. IMPRESSION: 16 mm lesion in the left hepatic lobe favors a flash filling hemangioma. Small para-aortic lymph nodes measuring up to 10 mm short axis, suspicious for nodal metastases, better evaluated on recent CT. Electronically Signed   By: Traver Hy M.D.   On: 10/12/2019 14:54        Scheduled Meds: . aspirin  81 mg Oral Daily  . ezetimibe  10 mg Oral QHS  . insulin aspart  0-5 Units Subcutaneous QHS  . insulin aspart  0-9 Units Subcutaneous TID WC  . lisinopril  2.5 mg Oral Daily  . metoprolol tartrate  75 mg Oral BID  . rosuvastatin  20 mg Oral Daily   Continuous Infusions:   LOS: 5 days    Time spent: 25 minutes    Barb Merino, MD [facility] Pager [phone]

## 2019-10-13 NOTE — PMR Pre-admission (Signed)
PMR Admission Coordinator Pre-Admission Assessment  Patient: John Douglas is an 64 y.o., male MRN: [id number] DOB: 05/28/1955 Height: '5\' 10"'$  (177.8 cm) Weight: 99.8 kg              Insurance Information HMO:     PPO: yes     PCP:      IPA:      80/20:      OTHER:  PRIMARY: Aetna Medicare      Policy#: [id number]      Subscriber: patient CM Name: Manuela Schwartz       Phone#: [phone]     Fax#: [phone] Pre-Cert#: [id number]      Employer: [employer]m [employer]auf[employer] provided by Manuela Schwartz for admit to CIR. Pt is approved for 7 days with start date 6/17. Next review date is 6/24. Updates go to [place] (p): [phone] (f): [phone]  Benefits:  Phone #: online     Name: [email or web address] Eff. Date: 04/30/17-04/29/20     Deduct: $0 -doesn't have one      Out of Pocket Max: $1,000 ($380 met)      Life Max: NA  CIR: $150 co-pay/admission       SNF: 100% coverage, 0% co-insurance; limited by medical necessity review; limited to 100 days/cal yr Outpatient: $20 co-pay/visit; limited by medical necessity    Home Health: 100% coverage, 0% co-insurance; limited by medical necessity review DME: 100% coverage, 0% co-insurance     Providers:  SECONDARY: None      Policy#:       Phone#:   Development worker, community:       Phone#:   The Engineer, petroleum" for patients in Inpatient Rehabilitation Facilities with attached "Privacy Act [place] Records" was provided and verbally reviewed with: Patient and Family  Emergency Contact Information Contact Information    Name Relation Home Work Mobile   [place] Daughter [phone]  [phone]   Lynch,Carolyn Daughter   [phone]   [facility] Significant other [phone]  [phone]     Current Medical History  Patient Admitting Diagnosis: 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 male with history of HTN, Wide complex tachycardia/AF- no AC, T2DM with neuropathy,  hematochezia with colonoscopy 06/09 positive for rectal adenocarcinoma who was admitted via [facility] on 10/08/19 with dizziness, several day history of weakness followed by LLE weakness with difficulty walking.  He was found to have A fib with RVR and CT head done revealing right PCA infarct with petechial hemorrhage. CTA head/neck showed normal intracranial and cervical arteries. Patient off Eliquis X 1 month-->on DAPT due to bloody stools. Dr. Leonie Man felt that stroke embolic due to A fib but questioned hypercoagulopathy from new dx CA. No DAPT due to petechial hemorrhage and AC with eliquis at d/c if no rectal bleeding noted. Dr. Martha Clan consulted for input on new diagnosis rectal cancer and recommended pan CT as well as CEA for work up.  CT chest, abdomen/pelvis done for staging and showed retroperitoneal and upper pelvic lymphadenopathy, hyperenhancing focus left hepatic lobe with surrounding parenchymal attenuation on venous phase--felt to be hemangioma and mild distal circumferential esophageal thickening. MRI pelvis showed rectal adenocarcinoma T4 N2 with tumor 4 cm distance from anal sphincter. Work up underway and CCS consulted for input. Per Dr. Marin Olp, the pt is not a surgical candidate at this time and they will proceed with systemic therapy most likely in July. Therapy evaluations completed revealing impulsivity with poor safety awareness, LLE weakness with decreased  coordination and motor planning deficits. CIR recommended due to functional deficits. Pt is to admit to CIR today, 10/15/19.     Complete NIHSS TOTAL: 2    Past Medical History  Past Medical History:  Diagnosis Date  . Allergic rhinitis    [facility]  . Anxiety   . Atrial fibrillation ([place])    [facility]  . Benign essential hypertension    [facility]  . Coronary artery disease    [facility]  . Diabetic neuropathy, type II diabetes mellitus  ([place])    [facility]  . Mixed hyperlipidemia    [facility]  . Myocardial infarct [facility])    [facility]  . Obesity, unspecified    [facility]  . Rectal bleeding    [facility]  . Wide-complex tachycardia ([place]) 06/04/2019    Family History  family history includes Diverticulitis in his mother; Heart disease in his father; Hypertension in his father; Prostate cancer in his father.  Prior Rehab/Hospitalizations:  Has the patient had prior rehab or hospitalizations prior to admission? Yes  Has the patient had major surgery during 100 days prior to admission? Yes  Current Medications   Current Facility-Administered Medications:  .  acetaminophen (TYLENOL) tablet 650 mg, 650 mg, Oral, Q4H PRN, 650 mg at 10/11/19 0712 **OR** acetaminophen (TYLENOL) 160 MG/5ML solution 650 mg, 650 mg, Per Tube, Q4H PRN **OR** acetaminophen (TYLENOL) suppository 650 mg, 650 mg, Rectal, Q4H PRN, Pahwani, Rinka R, MD .  aspirin chewable tablet 81 mg, 81 mg, Oral, Daily, Pahwani, Rinka R, MD, 81 mg at 10/15/19 0918 .  clonazePAM (KLONOPIN) disintegrating tablet 1 mg, 1 mg, Oral, BID PRN, Barb Merino, MD, 1 mg at 10/15/19 0544 .  clopidogrel (PLAVIX) tablet 75 mg, 75 mg, Oral, Daily, Barb Merino, MD, 75 mg at 10/15/19 0918 .  ezetimibe (ZETIA) tablet 10 mg, 10 mg, Oral, QHS, Pahwani, Rinka R, MD, 10 mg at 10/14/19 2232 .  insulin aspart (novoLOG) injection 0-5 Units, 0-5 Units, Subcutaneous, QHS, Ghimire, Kuber, MD .  insulin aspart (novoLOG) injection 0-9 Units, 0-9 Units, Subcutaneous, TID WC, Barb Merino, MD, 1 Units at 10/15/19 1211 .  lisinopril (ZESTRIL) tablet 2.5 mg, 2.5 mg, Oral, Daily, Ghimire, Kuber, MD, 2.5 mg at 10/15/19 0918 .  methocarbamol (ROBAXIN) tablet 500 mg, 500 mg, Oral, Q8H PRN, Opyd, Ilene Qua, MD, 500 mg at 10/15/19 0357 .  metoprolol tartrate (LOPRESSOR) tablet 75 mg, 75 mg,  Oral, BID, Ghimire, Kuber, MD, 75 mg at 10/15/19 0917 .  nitroGLYCERIN (NITROSTAT) SL tablet 0.4 mg, 0.4 mg, Sublingual, Q5 min PRN, Pahwani, Rinka R, MD .  rosuvastatin (CRESTOR) tablet 20 mg, 20 mg, Oral, Daily, Pahwani, Rinka R, MD, 20 mg at 10/15/19 0918 .  sodium chloride flush (NS) 0.9 % injection 10 mL, 10 mL, Intravenous, PRN, Barb Merino, MD, 10 mL at 10/09/19 0939  Patients Current Diet:  Diet Order            Diet - low sodium heart healthy           Diet Carb Modified           Diet Carb Modified Fluid consistency: Thin; Room service appropriate? Yes  Diet effective now                 Precautions / Restrictions Precautions Precautions: Fall Restrictions Weight Bearing Restrictions: No   Has  the patient had 2 or more falls or a fall with injury in the past year?No  Prior Activity Level Community (5-7x/wk): Pt. was working and leaving home most days.  Prior Functional Level Prior Function Level of Independence: Independent Comments: pt reports working, driving, and independence in all ADLs  Self Care: Did the patient need help bathing, dressing, using the toilet or eating?  Independent  Indoor Mobility: Did the patient need assistance with walking from room to room (with or without device)? Independent  Stairs: Did the patient need assistance with internal or external stairs (with or without device)? Independent  Functional Cognition: Did the patient need help planning regular tasks such as shopping or remembering to take medications? Independent  Home Assistive Devices / Equipment Home Assistive Devices/Equipment: Eyeglasses Home Equipment: None  Prior Device Use: Indicate devices/aids used by the patient prior to current illness, exacerbation or injury? None of the above  Current Functional Level Cognition  Arousal/Alertness: Awake/alert Overall Cognitive Status: Impaired/Different from baseline Current Attention Level: Selective Orientation  Level: Oriented X4 Following Commands: Follows one step commands consistently, Follows multi-step commands inconsistently Safety/Judgement: Decreased awareness of deficits General Comments: Printmaker (TMT) parts A & B and pt presented deficits in STM, decreased ability to sequence, decreased ability to follow multiple step commands, and decreased awareness of deficits. Attention: Selective Selective Attention: Appears intact Memory: Appears intact Awareness: Impaired Awareness Impairment: Emergent impairment Problem Solving: Impaired Problem Solving Impairment: Functional complex Safety/Judgment: Impaired    Extremity Assessment (includes Sensation/Coordination)  Upper Extremity Assessment: Overall WFL for tasks assessed LUE Deficits / Details: No deficits in strength. decreased coordination noted in finger to nose.  LUE Sensation: WNL LUE Coordination: decreased fine motor, decreased gross motor  Lower Extremity Assessment: Defer to PT evaluation LLE Sensation: decreased light touch LLE Coordination: decreased gross motor    ADLs  Overall ADL's : Needs assistance/impaired Grooming: Oral care, Minimal assistance, With adaptive equipment, Standing Grooming Details (indicate cue type and reason): Min A for steadying at sink while standing  Upper Body Bathing: Supervision/ safety, Sitting Upper Body Bathing Details (indicate cue type and reason): supervision for safety Lower Body Bathing: Supervison/ safety, Sitting/lateral leans Lower Body Bathing Details (indicate cue type and reason): supervision for safety Upper Body Dressing : Supervision/safety, Sitting Upper Body Dressing Details (indicate cue type and reason): supervision for safety Lower Body Dressing: Minimal assistance, Sit to/from stand Lower Body Dressing Details (indicate cue type and reason): Min A to help with balance Toilet Transfer: Minimal assistance, Ambulation, Regular Toilet Toilet Transfer  Details (indicate cue type and reason): Min A to steady upon standing Toileting- Clothing Manipulation and Hygiene: Minimal assistance Toileting - Clothing Manipulation Details (indicate cue type and reason): Min A to steady Tub/Shower Transfer Details (indicate cue type and reason): deferred assess next session Functional mobility during ADLs: Minimal assistance, Rolling walker, Cueing for safety General ADL Comments: Pt requires supervision when seated. Pt has decreased balance and is impoulsive and unsafe with walker. Needs verbal cues and min A to complete ADLs in standing.     Mobility  Overal bed mobility: Needs Assistance Bed Mobility: Supine to Sit, Sit to Supine Supine to sit: Supervision Sit to supine: Supervision General bed mobility comments: supervision for safety.     Transfers  Overall transfer level: Needs assistance Equipment used: Rolling walker (2 wheeled), 1 person hand held assist Transfers: Sit to/from Stand Sit to Stand: Min assist General transfer comment: Practiced standing with both HHA and RW. Pt  requiring min A for steadying.     Ambulation / Gait / Stairs / Wheelchair Mobility  Ambulation/Gait Ambulation/Gait assistance: Mod assist, Min assist Gait Distance (Feet): 100 Feet Assistive device: Rolling walker (2 wheeled) Gait Pattern/deviations: Decreased step length - left, Wide base of support, Step-through pattern, Decreased dorsiflexion - left, Decreased stride length General Gait Details: Cues for step height and heel strike on the L. Required safety cues for safe use of RW. With increased distance, pt requiring increased support. Min to mod A for steadying.  Gait velocity: Decreased    Posture / Balance Dynamic Sitting Balance Sitting balance - Comments: no assist needed for sitting balance Balance Overall balance assessment: Needs assistance Sitting-balance support: Feet supported Sitting balance-Leahy Scale: Good Sitting balance - Comments: no  assist needed for sitting balance Standing balance support: During functional activity, Single extremity supported, Bilateral upper extremity supported Standing balance-Leahy Scale: Poor Standing balance comment: [place] BUE support and external assist.     Special needs/care consideration Diabetic management: yes, Special service needs: pt may require chemotherapy after CIR and other oncologic needs  and Designated visitor : TBD once admitted.      Previous Home Environment (from acute therapy documentation) Living Arrangements: Alone  Lives With: Alone Available Help at Discharge: Family, Available PRN/intermittently Type of Home: House Home Layout: One level Home Access: Stairs to enter Entrance Stairs-Rails: None Entrance Stairs-Number of Steps: 1 Bathroom Shower/Tub: Associate Professor: Yes How Accessible: Accessible via walker Home Care Services: No Additional Comments: family reports availability to cane and wc if needed  Discharge Living Setting Plans for Discharge Living Setting: Patient's home (May potentially rent a more accessibe house beside daughter) Type of Home at Discharge: House Discharge Home Layout: One level Discharge Home Access: Stairs to enter Entrance Stairs-Rails: None Entrance Stairs-Number of Steps: 1 Discharge Bathroom Shower/Tub: Tub/shower unit Discharge Bathroom Toilet: Standard Discharge Bathroom Accessibility: Yes Does the patient have any problems obtaining your medications?: No  Social/Family/Support Systems Patient Roles: Other (Comment) Contact Information: [phone] Anticipated Caregiver: Alfredo Spong (daughter) Anticipated Caregiver's Contact Information: [phone] Ability/Limitations of Caregiver:  Supervision  Caregiver Availability: intermittant Discharge Plan Discussed with Primary Caregiver: Yes Is Caregiver In Agreement with Plan?: Yes Does Caregiver/Family have Issues  with Lodging/Transportation while Pt is in Rehab?: No   Goals Patient/Family Goal for Rehab:  (PT/OT/SLP mod I) Expected length of stay: 5-7 days Pt/Family Agrees to Admission and willing to participate: Yes Program Orientation Provided & Reviewed with Pt/Caregiver Including Roles  & Responsibilities: Yes  Barriers to Discharge: Pending chemo/radiation   Decrease burden of Care through IP rehab admission: NA   Possible need for SNF placement upon discharge:Not anticipated; pt has an excellent prognosis in the near term for functional progress through CIR program with anticipated goals of Mod I. Per pt's daughter they can stay with him at DC for a few days for support as needed.    Patient Condition: This patient's medical and functional status has changed since the consult dated 10/12/19 in which the Rehabilitation Physician determined and documented that the patient was potentially appropriate for intensive rehabilitative care in an inpatient rehabilitation facility. Issues have been addressed and update has been discussed with Dr. Riley Kill and patient now appropriate for inpatient rehabilitation as he is still at a Min A level with functional needs. It appears that his acute oncology workup has been completed and he will begin oncology therapy in July (after CIR stay). Will admit to inpatient rehab today.  Preadmission Screen Completed By:  Raechel Ache, OT, 10/15/2019 1:22 PM ______________________________________________________________________    Discussed status with Dr. Ranell Patrick on 10/15/19 at 1:19PM and received approval for admission today.  Admission Coordinator:  Raechel Ache, time 1:19PM/Date 10/15/19

## 2019-10-13 NOTE — Progress Notes (Signed)
Physical Therapy Treatment Patient Details Name: John Douglas MRN: [id number] DOB: 10-18-1955 Today's Date: 10/13/2019    History of Present Illness John Douglas is a 64 y.o. male with medical history significant of coronary artery disease status post stents, paroxysmal A. fib, hypertension, diabetes, hyperlipidemia, marijuana abuse, tobacco abuse, obesity, recent GI bleeding status post colonoscopy on 10/07/2019 presents to emergency department due to left leg weakness and difficulty walking. MRI positive for R PCA infarct.    PT Comments    Patient is making progress toward PT goals and tolerated session well. Pt requires min-mod A for functional transfer/gait training due to impaired balance/coordination and L side weakness. Pt is reliant on bilat UE for ambulation. Pt remains a good candidate for CIR level therapies to maximize independence and safety with mobility.     Follow Up Recommendations  CIR     Equipment Recommendations  Other (comment) (TBD)    Recommendations for Other Services Rehab consult     Precautions / Restrictions Precautions Precautions: Fall Restrictions Weight Bearing Restrictions: No    Mobility  Bed Mobility               General bed mobility comments: Sitting EOB upon entry   Transfers Overall transfer level: Needs assistance Equipment used: Straight cane;1 person hand held assist Transfers: Sit to/from Stand Sit to Stand: Min assist         General transfer comment: assist for balance upon standing; pt able to power up with min guard for safety  Ambulation/Gait Ambulation/Gait assistance: Mod assist;Min assist Gait Distance (Feet): 120 Feet Assistive device: 1 person hand held assist;Straight cane Gait Pattern/deviations: Decreased step length - left;Wide base of support;Step-through pattern;Decreased dorsiflexion - left;Decreased stride length Gait velocity: Decreased   General Gait Details: cues for sequencing of gait with  use of AD, safe gait speed (began attempting to move too quickly), upright posture, and increased L step length; noted L LE fatigue with increased distance; assistance required for balance and weight shifting    Stairs             Wheelchair Mobility    Modified Rankin (Stroke Patients Only) Modified Rankin (Stroke Patients Only) Pre-Morbid Rankin Score: No symptoms Modified Rankin: Moderately severe disability     Balance Overall balance assessment: Needs assistance Sitting-balance support: Feet supported Sitting balance-Leahy Scale: Good     Standing balance support: Bilateral upper extremity supported;During functional activity Standing balance-Leahy Scale: Poor                              Cognition Arousal/Alertness: Awake/alert Behavior During Therapy: WFL for tasks assessed/performed;Impulsive Overall Cognitive Status: Impaired/Different from baseline Area of Impairment: Attention;Safety/judgement;Problem solving;Memory;Following commands                   Current Attention Level: Selective Memory: Decreased short-term memory Following Commands: Follows one step commands consistently;Follows multi-step commands inconsistently Safety/Judgement: Decreased awareness of deficits;Decreased awareness of safety   Problem Solving: Requires verbal cues        Exercises      General Comments        Pertinent Vitals/Pain Pain Assessment: No/denies pain    Home Living                      Prior Function            PT Goals (current goals can now be found in the care plan  section) Acute Rehab PT Goals Patient Stated Goal: to return to independent Progress towards PT goals: Progressing toward goals    Frequency    Min 4X/week      PT Plan Current plan remains appropriate    Co-evaluation              AM-PAC PT "6 Clicks" Mobility   Outcome Measure  Help needed turning from your back to your side while in a  flat bed without using bedrails?: None Help needed moving from lying on your back to sitting on the side of a flat bed without using bedrails?: A Little Help needed moving to and from a bed to a chair (including a wheelchair)?: A Little Help needed standing up from a chair using your arms (e.g., wheelchair or bedside chair)?: A Little Help needed to walk in hospital room?: A Lot Help needed climbing 3-5 steps with a railing? : A Lot 6 Click Score: 17    End of Session Equipment Utilized During Treatment: Gait belt Activity Tolerance: Patient tolerated treatment well Patient left: in bed;with call bell/phone within reach;with bed alarm set (sitting at EOB ) Nurse Communication: Mobility status PT Visit Diagnosis: Unsteadiness on feet (R26.81);Other abnormalities of gait and mobility (R26.89);Hemiplegia and hemiparesis;Difficulty in walking, not elsewhere classified (R26.2) Hemiplegia - Right/Left: Left Hemiplegia - dominant/non-dominant: Non-dominant Hemiplegia - caused by: Cerebral infarction     Time: 5726-2035 PT Time Calculation (min) (ACUTE ONLY): 24 min  Charges:  $Gait Training: 23-37 mins                     Earney Navy, PTA Acute Rehabilitation Services Pager: [phone] Office: [phone]     Darliss Cheney 10/13/2019, 12:39 PM

## 2019-10-13 NOTE — Progress Notes (Signed)
Patient ID: John Douglas, male   DOB: 24-Apr-1956, 64 y.o.   MRN: [id number] Needs chemo, stage IV disease sounds like, our office will call for follow up with colorectal surgery

## 2019-10-13 NOTE — Plan of Care (Signed)
  Problem: Education: Goal: Knowledge of General Education information will improve Description: Including pain rating scale, medication(s)/side effects and non-pharmacologic comfort measures Outcome: Progressing   Problem: Activity: Goal: Risk for activity intolerance will decrease Outcome: Progressing   Problem: Nutrition: Goal: Adequate nutrition will be maintained Outcome: Progressing   Problem: Coping: Goal: Level of anxiety will decrease Outcome: Progressing   

## 2019-10-14 LAB — GLUCOSE, CAPILLARY
Glucose-Capillary: 141 mg/dL — ABNORMAL HIGH (ref 70–99)
Glucose-Capillary: 222 mg/dL — ABNORMAL HIGH (ref 70–99)
Glucose-Capillary: 146 mg/dL — ABNORMAL HIGH (ref 70–99)
Glucose-Capillary: 166 mg/dL — ABNORMAL HIGH (ref 70–99)

## 2019-10-14 MED ORDER — SODIUM CHLORIDE 0.9 % IV SOLN
510.0000 mg | Freq: Once | INTRAVENOUS | Status: AC
  Administered 2019-10-14: 510 mg via INTRAVENOUS
  Filled 2019-10-14: qty 17

## 2019-10-14 MED ORDER — CLOPIDOGREL BISULFATE 75 MG PO TABS
75.0000 mg | ORAL_TABLET | Freq: Every day | ORAL | Status: DC
  Administered 2019-10-14 – 2019-10-15 (×2): 75 mg via ORAL
  Filled 2019-10-14 (×2): qty 1

## 2019-10-14 NOTE — Progress Notes (Signed)
So far, John Douglas seems to be doing better with respect to the CVA.  The left leg seems to be improving a little bit.  Hopefully, he will go up to the Rehab Unit today.  The MRI of his liver did not show any metastatic disease.  Surgery reviewed his record.  They looked at his scans.  They say that he is not a candidate for surgery unless there is a problem with respect to blockage.  As such, we will proceed with systemic therapy.  I will send his tumor off for molecular markers.  I want to see if his tumor has a BRAF gene mutation.  If so, we would then be able to utilize targeted therapy.  I Want to See If There Is Any MSI/MMR abnormalities that might be able to utilize immunotherapy.  The chance of [place] using treatment outside of chemotherapy is probably less than 10%.  I still think that we have the "flexibility" to be able to hold off on therapy until he gets back from his beach trip which is the week of July 4.  I know this is very important for him.  He is going to the bathroom.  He is not having pelvic pain.  He is having no obvious bleeding.  He does not want to have a Port-A-Cath placed until after the beach trip.  He is iron deficient.  His ferritin is 35 with an iron saturation of 13%.  I will go ahead and give him a dose of IV iron.  He has stage IV disease.  By definition, this is not curable.  Thankfully, he only has lymphadenopathy as a manifestation of stage IV.  Hopefully this will be able to respond to treatment.  It sounds like he might be a week or so in the Rehab Unit.  I appreciate the great care that he is getting from everybody up on 3 [address].  Lattie Haw, MD  2 Corinthians 9:8

## 2019-10-14 NOTE — Progress Notes (Signed)
Small amount of bright red blood in patient's stool. Dr. Sloan Leiter notified, will continue to monitor

## 2019-10-14 NOTE — TOC CAGE-AID Note (Signed)
Transition of Care [facility]) - CAGE-AID Screening   Patient Details  Name: John Douglas MRN: [id number] Date of Birth: [birth year]  Transition of Care [facility]) CM/SW Contact:    Emeterio Reeve, [place] Phone Number: 10/14/2019, 1:36 PM   Clinical Narrative:  Pt denies alcohol use. Pt states he smokes marijuana on occasion. Pt states he uses it for anxiety. Pt states he has already cut back his use. Pt was receptive to counseling. Pt denied resources.   CAGE-AID Screening:    Have You Ever Felt You Ought to Cut Down on Your Drinking or Drug Use?: No Have People Annoyed You By Critizing Your Drinking Or Drug Use?: No Have You Felt Bad Or Guilty About Your Drinking Or Drug Use?: No Have You Ever Had a Drink or Used Drugs First Thing In The Morning to STeady Your Nerves or to Get Rid of a Hangover?: No CAGE-AID Score: 0  Substance Abuse Education Offered: Yes  Substance abuse interventions: Patient Counseling  Emeterio Reeve, Latanya Presser, [place] Social Worker [phone]

## 2019-10-14 NOTE — Progress Notes (Signed)
PROGRESS NOTE    John Douglas  KPT:[id number] DOB: Jun 16, 1955 DOA: 10/08/2019 PCP: Leonides Sake, MD    Brief Narrative:  64 year old gentleman with extensive cardiovascular problems.  STEMI and PCI to RCA in 17/0017 complicated by sustained ventricular tachycardia .  Aspirin and Brilinta Non-STEMI and subsequent PCI to LAD on 06/2019 along with paroxysmal A. fib.  Aspirin and Plavix.  Anticoagulation deferred because of ongoing GI evaluation. He was having intermittent hematochezia more than 6 months ago.  He attributed that to Metformin. Never had colonoscopy.  Because of STEMI and LAD stent, waited 3 months of dual antiplatelet therapy to get a colonoscopy.  6/9, Plavix held for 1 week and continued on aspirin, underwent colonoscopy, found to have adenocarcinoma of the colon with partially obstructive lesion. Since the day of colonoscopy or the night before only he was having some weakness, dragging his left foot, dizzy and disoriented.  He reported the symptoms while he was taking bowel prep that was thought to be from bowel prep.  6/10, went back to [facility] ER.  Found to be in A. fib with RVR heart rate 150.  CT head showed right PCA distribution infarct and transferred to [facility].   Assessment & Plan:   Principal Problem:   Ischemic stroke [facility]) Active Problems:   Paroxysmal atrial fibrillation (HCC)   Essential hypertension   Hyperlipidemia LDL goal <70   Coronary artery disease   Ischemic cardiomyopathy   Leukocytosis   Hypokalemia   Colon cancer (HCC)  Right PCA territory ischemic infarct with petechial hemorrhagic conversion: With history of paroxysmal A. Fib Clinical findings, dizziness, lightheadedness and confusion, left leg weakness. CT head findings, right PCA territory ischemic infarct. MRI of the brain, acute infarct right PCA territory, posterior limb of internal capsule.  Petechial blood within the posterior medial right temporal lobe.  No  mass-effect. CTA of the head and neck, no large vessel occlusion. 2D echocardiogram,normal. Echocardiogram 06/2019 with ejection fraction 45%. Antiplatelet therapy, aspirin at home continued.   LDL 98.  Last LDL 32 on 4/30.  On rosuvastatin and Zetia.  Continue. Hemoglobin A1c 8.  Not on treatment.  Was on Metformin in the past so he must be diagnosed with diabetes. Continue to work with PT OT.  Appreciate neurology input.  Stable to transfer to CIR. 6/16, there is no plan for Port-A-Cath placement at least before July 4, will resume Plavix.  Paroxysmal atrial fibrillation:  On sinus rhythm.  On metoprolol.  Eliquis discontinued due to rectal bleeding.  Resuming aspirin and Plavix.  Coronary artery disease, ST elevation MI 03/2019, non-STEMI 05/2018 with VT:  Currently stable.  Without chest pain. Patient metoprolol and lisinopril.  On aspirin.  Resume Plavix and monitor for rectal bleeding.  Hematochezia/recently diagnosed adenocarcinoma of the colon:  New diagnosed metastatic adenocarcinoma.  Followed by oncology.   Staging MR and Ct chest / abdomen completed.  Followed by Dr Marin Olp  Further plan as per oncology.  Hypokalemia:  Replaced.  Patient had a conversation with Dr. Marin Olp.  The plan not to have a Port-A-Cath placement.  He does have secretory diarrhea but no hematochezia.  Will resume Plavix.  DVT prophylaxis: SCD Code Status: Full code Family Communication: Patient's daughter at the bedside. Disposition Plan: Status is: Inpatient  Remains inpatient appropriate because: Stabilizing.  Anticipating transfer to rehab with oncology follow-up for neoadjuvant therapy.   Dispo: The patient is from: Home  Anticipated d/c is to: Inpatient rehab.              Anticipated d/c date is: Medically stable.              Patient currently medically stable to transfer to rehab.         Consultants:   Oncology  Neurology  Procedures:    None  Antimicrobials:   None   Subjective: Patient seen and examined.  No overnight events.  Objective: Vitals:   10/13/19 1921 10/13/19 2349 10/14/19 0316 10/14/19 0747  BP: (!) 116/103 (!) 167/85 (!) 156/86 138/75  Pulse: 68 (!) 59 64 67  Resp: 17 17 17 18   Temp: 98.4 F (36.9 C) 98.2 F (36.8 C) 98 F (36.7 C) 98.2 F (36.8 C)  TempSrc: Oral Oral Oral Oral  SpO2: 95% 98% 98% 97%  Weight:      Height:        Intake/Output Summary (Last 24 hours) at 10/14/2019 1140 Last data filed at 10/14/2019 0700 Gross per 24 hour  Intake 596 ml  Output --  Net 596 ml   Filed Weights   10/08/19 1122  Weight: 99.8 kg    Examination:  Physical Exam HENT:     Head: Normocephalic.  Eyes:     Pupils: Pupils are equal, round, and reactive to light.  Cardiovascular:     Rate and Rhythm: Normal rate and regular rhythm.  Pulmonary:     Breath sounds: Normal breath sounds.  Abdominal:     Palpations: Abdomen is soft.  Neurological:     Mental Status: He is alert and oriented to person, place, and time.     Comments: Cranial nerves no deficit. Mild weakness on the left lower extremity, drift present.  Able to elevate against gravity.   Obvious left lower extremity deficit visible on walking in the hallway.   Data Reviewed: I have personally reviewed following labs and imaging studies  CBC: Recent Labs  Lab 10/08/19 1128 10/09/19 0500  WBC 12.3* 8.8  NEUTROABS  --  6.3  HGB 12.2* 12.0*  HCT 38.1* 37.0*  MCV 85.8 84.7  PLT 256 841   Basic Metabolic Panel: Recent Labs  Lab 10/08/19 1128 10/09/19 0500  NA 137 140  K 3.4* 3.2*  CL 106 109  CO2 19* 22  GLUCOSE 194* 155*  BUN 8 10  CREATININE 0.90 0.94  CALCIUM 8.2* 8.6*  MG 1.8  --    GFR: Estimated Creatinine Clearance: 92.8 mL/min (by C-G formula based on SCr of 0.94 mg/dL). Liver Function Tests: No results for input(s): AST, ALT, ALKPHOS, BILITOT, PROT, ALBUMIN in the last 168 hours. No results for  input(s): LIPASE, AMYLASE in the last 168 hours. No results for input(s): AMMONIA in the last 168 hours. Coagulation Profile: No results for input(s): INR, PROTIME in the last 168 hours. Cardiac Enzymes: No results for input(s): CKTOTAL, CKMB, CKMBINDEX, TROPONINI in the last 168 hours. BNP (last 3 results) No results for input(s): PROBNP in the last 8760 hours. HbA1C: No results for input(s): HGBA1C in the last 72 hours. CBG: Recent Labs  Lab 10/13/19 0600 10/13/19 1148 10/13/19 1524 10/13/19 2115 10/14/19 0622  GLUCAP 153* 156* 132* 191* 141*   Lipid Profile: No results for input(s): CHOL, HDL, LDLCALC, TRIG, CHOLHDL, LDLDIRECT in the last 72 hours. Thyroid Function Tests: No results for input(s): TSH, T4TOTAL, FREET4, T3FREE, THYROIDAB in the last 72 hours. Anemia Panel: Recent Labs    10/12/19 0805  FERRITIN 35  TIBC 343  IRON 46   Sepsis Labs: No results for input(s): PROCALCITON, LATICACIDVEN in the last 168 hours.  Recent Results (from the past 240 hour(s))  SARS Coronavirus 2 by RT PCR (hospital order, performed in [facility] hospital lab) Nasopharyngeal Nasopharyngeal Swab     Status: None   Collection Time: 10/08/19  5:40 PM   Specimen: Nasopharyngeal Swab  Result Value Ref Range Status   SARS Coronavirus 2 NEGATIVE NEGATIVE Final    Comment: (NOTE) SARS-CoV-2 target nucleic acids are NOT DETECTED.  The SARS-CoV-2 RNA is generally detectable in upper and lower respiratory specimens during the acute phase of infection. The lowest concentration of SARS-CoV-2 viral copies this assay can detect is 250 copies / mL. A negative result does not preclude SARS-CoV-2 infection and should not be used as the sole basis for treatment or other patient management decisions.  A negative result may occur with improper specimen collection / handling, submission of specimen other than nasopharyngeal swab, presence of viral mutation(s) within the areas targeted by this  assay, and inadequate number of viral copies (<250 copies / mL). A negative result must be combined with clinical observations, patient history, and epidemiological information.  Fact Sheet for Patients:   StrictlyIdeas.no  Fact Sheet for Healthcare Providers: BankingDealers.co.za  This test is not yet approved or  cleared by the [place] FDA and has been authorized for detection and/or diagnosis of SARS-CoV-2 by FDA under an Emergency Use Authorization (EUA).  This EUA will remain in effect (meaning this test can be used) for the duration of the COVID-19 declaration under Section 564(b)(1) of the Act, 21 U.S.C. section 360bbb-3(b)(1), unless the authorization is terminated or revoked sooner.  Performed at [facility], [place] [address], [place], [place] [address]          Radiology Studies: MR LIVER W WO CONTRAST  Result Date: 10/12/2019 CLINICAL DATA:  Rectal cancer.  Liver lesion on CT. EXAM: MRI ABDOMEN WITHOUT AND WITH CONTRAST TECHNIQUE: Multiplanar multisequence MR imaging of the abdomen was performed both before and after the administration of intravenous contrast. CONTRAST:  72mL GADAVIST GADOBUTROL 1 MMOL/ML IV SOLN COMPARISON:  CT abdomen dated 10/10/2019 FINDINGS: Motion degraded images. Lower chest: Lung bases are clear. Hepatobiliary: 16 x 10 mm T2 hyperintense lesion along the posterior aspect of segment 2 (series 5/image 13), with avid arterial enhancement (series 802/image 45), without washout. This appearance favors a flash filling hemangioma. No morphologic findings of cirrhosis. No hepatic steatosis. Gallbladder is unremarkable. No intrahepatic or extrahepatic ductal dilatation. Pancreas:  Within normal limits. Spleen:  Within normal limits. Adrenals/Urinary Tract:  Adrenal glands are within normal limits. Kidneys are within normal limits.  No hydronephrosis. Stomach/Bowel: Stomach is within normal limits.  Visualized bowel is unremarkable. Vascular/Lymphatic:  No evidence of abdominal aortic aneurysm. Para-aortic lymph nodes measuring up to 10 mm short axis (series 801/image 96), better evaluated on recent CT. Other:  No abdominal ascites. Musculoskeletal: No focal osseous lesions. IMPRESSION: 16 mm lesion in the left hepatic lobe favors a flash filling hemangioma. Small para-aortic lymph nodes measuring up to 10 mm short axis, suspicious for nodal metastases, better evaluated on recent CT. Electronically Signed   By: Jameil Hy M.D.   On: 10/12/2019 14:54        Scheduled Meds: . aspirin  81 mg Oral Daily  . clopidogrel  75 mg Oral Daily  . ezetimibe  10 mg Oral QHS  . insulin aspart  0-5  Units Subcutaneous QHS  . insulin aspart  0-9 Units Subcutaneous TID WC  . lisinopril  2.5 mg Oral Daily  . metoprolol tartrate  75 mg Oral BID  . rosuvastatin  20 mg Oral Daily   Continuous Infusions:   LOS: 6 days    Time spent: 25 minutes    Barb Merino, MD [facility] Pager [phone]

## 2019-10-14 NOTE — Progress Notes (Signed)
Physical Therapy Treatment Patient Details Name: John Douglas MRN: [id number] DOB: 04/20/1956 Today's Date: 10/14/2019    History of Present Illness John Douglas is a 64 y.o. male with medical history significant of coronary artery disease status post stents, paroxysmal A. fib, hypertension, diabetes, hyperlipidemia, marijuana abuse, tobacco abuse, obesity, recent GI bleeding status post colonoscopy on 10/07/2019 presents to emergency department due to left leg weakness and difficulty walking. MRI positive for R PCA infarct.    PT Comments    Pt continues to progress towards goals. Remains unsteady, especially without use of AD requiring min to mod A for steadying. As pt fatigues, requires increased assist. Practiced pre gait activities only using HHA, and pt requiring mod A. Continues to exhibit weakness and ataxia in LLE. Current recommendations appropriate as pt very motivated to regain independence. Will continue to follow acutely to maximize functional mobility independence and safety.     Follow Up Recommendations  CIR     Equipment Recommendations  Other (comment) (TBD)    Recommendations for Other Services Rehab consult     Precautions / Restrictions Precautions Precautions: Fall Restrictions Weight Bearing Restrictions: No    Mobility  Bed Mobility Overal bed mobility: Needs Assistance Bed Mobility: Supine to Sit;Sit to Supine     Supine to sit: Supervision Sit to supine: Supervision   General bed mobility comments: supervision for safety.   Transfers Overall transfer level: Needs assistance Equipment used: Rolling walker (2 wheeled);1 person hand held assist Transfers: Sit to/from Stand Sit to Stand: Min assist         General transfer comment: Practiced standing with both HHA and RW. Pt requiring min A for steadying.   Ambulation/Gait Ambulation/Gait assistance: Mod assist;Min assist Gait Distance (Feet): 100 Feet Assistive device: Rolling walker (2  wheeled) Gait Pattern/deviations: Decreased step length - left;Wide base of support;Step-through pattern;Decreased dorsiflexion - left;Decreased stride length Gait velocity: Decreased   General Gait Details: Cues for step height and heel strike on the L. Required safety cues for safe use of RW. With increased distance, pt requiring increased support. Min to mod A for steadying.    Stairs             Wheelchair Mobility    Modified Rankin (Stroke Patients Only) Modified Rankin (Stroke Patients Only) Pre-Morbid Rankin Score: No symptoms Modified Rankin: Moderately severe disability     Balance Overall balance assessment: Needs assistance Sitting-balance support: Feet supported Sitting balance-Leahy Scale: Good     Standing balance support: During functional activity;Single extremity supported;Bilateral upper extremity supported Standing balance-Leahy Scale: Poor Standing balance comment: [place] BUE support and external assist.                             Cognition Arousal/Alertness: Awake/alert Behavior During Therapy: WFL for tasks assessed/performed Overall Cognitive Status: Impaired/Different from baseline Area of Impairment: Attention;Memory;Following commands;Safety/judgement;Awareness                   Current Attention Level: Selective Memory: Decreased short-term memory Following Commands: Follows one step commands consistently;Follows multi-step commands inconsistently Safety/Judgement: Decreased awareness of deficits Awareness: Emergent Problem Solving: Difficulty sequencing;Slow processing;Requires verbal cues        Exercises Other Exercises Other Exercises: Practiced pre gait activities X10 without use of AD and HHA only. Increased support when performing stance phase on LLE. Pt also with ataxia when taking step forward with LLE. Grossly requiring mod A throughout.     General  Comments        Pertinent Vitals/Pain Pain  Assessment: No/denies pain    Home Living                      Prior Function            PT Goals (current goals can now be found in the care plan section) Acute Rehab PT Goals Patient Stated Goal: to return to independent PT Goal Formulation: With patient Time For Goal Achievement: 10/23/19 Potential to Achieve Goals: Good Progress towards PT goals: Progressing toward goals    Frequency    Min 4X/week      PT Plan Current plan remains appropriate    Co-evaluation              AM-PAC PT "6 Clicks" Mobility   Outcome Measure  Help needed turning from your back to your side while in a flat bed without using bedrails?: None Help needed moving from lying on your back to sitting on the side of a flat bed without using bedrails?: None Help needed moving to and from a bed to a chair (including a wheelchair)?: A Little Help needed standing up from a chair using your arms (e.g., wheelchair or bedside chair)?: A Little Help needed to walk in hospital room?: A Lot Help needed climbing 3-5 steps with a railing? : Total 6 Click Score: 17    End of Session Equipment Utilized During Treatment: Gait belt Activity Tolerance: Patient tolerated treatment well Patient left: in bed;with call bell/phone within reach;with bed alarm set Nurse Communication: Mobility status PT Visit Diagnosis: Unsteadiness on feet (R26.81);Other abnormalities of gait and mobility (R26.89);Hemiplegia and hemiparesis;Difficulty in walking, not elsewhere classified (R26.2) Hemiplegia - Right/Left: Left Hemiplegia - dominant/non-dominant: Non-dominant Hemiplegia - caused by: Cerebral infarction     Time: 3212-2482 PT Time Calculation (min) (ACUTE ONLY): 23 min  Charges:  $Gait Training: 23-37 mins                     Lou Miner, DPT  Acute Rehabilitation Services  Pager: [phone] Office: [phone]    Rudean Hitt 10/14/2019, 6:29 PM

## 2019-10-15 ENCOUNTER — Encounter (HOSPITAL_COMMUNITY): Payer: Self-pay | Admitting: Physical Medicine and Rehabilitation

## 2019-10-15 ENCOUNTER — Inpatient Hospital Stay (HOSPITAL_COMMUNITY)
Admission: RE | Admit: 2019-10-15 | Discharge: 2019-10-23 | DRG: 057 | Disposition: A | Discharge status: Home / Self Care | Payer: Medicare HMO | Source: Intra-hospital | Attending: Physical Medicine and Rehabilitation | Admitting: Physical Medicine and Rehabilitation

## 2019-10-15 DIAGNOSIS — I69344 Monoplegia of lower limb following cerebral infarction affecting left non-dominant side: Principal | ICD-10-CM

## 2019-10-15 DIAGNOSIS — Z880 Allergy status to penicillin: Secondary | ICD-10-CM

## 2019-10-15 DIAGNOSIS — C20 Malignant neoplasm of rectum: Secondary | ICD-10-CM | POA: Diagnosis present

## 2019-10-15 DIAGNOSIS — I2510 Atherosclerotic heart disease of native coronary artery without angina pectoris: Secondary | ICD-10-CM | POA: Diagnosis present

## 2019-10-15 DIAGNOSIS — Z91030 Bee allergy status: Secondary | ICD-10-CM

## 2019-10-15 DIAGNOSIS — I63531 Cerebral infarction due to unspecified occlusion or stenosis of right posterior cerebral artery: Secondary | ICD-10-CM | POA: Diagnosis present

## 2019-10-15 DIAGNOSIS — E669 Obesity, unspecified: Secondary | ICD-10-CM | POA: Diagnosis present

## 2019-10-15 DIAGNOSIS — J309 Allergic rhinitis, unspecified: Secondary | ICD-10-CM | POA: Diagnosis present

## 2019-10-15 DIAGNOSIS — Z8249 Family history of ischemic heart disease and other diseases of the circulatory system: Secondary | ICD-10-CM

## 2019-10-15 DIAGNOSIS — Z23 Encounter for immunization: Secondary | ICD-10-CM | POA: Diagnosis not present

## 2019-10-15 DIAGNOSIS — Z79899 Other long term (current) drug therapy: Secondary | ICD-10-CM

## 2019-10-15 DIAGNOSIS — Z720 Tobacco use: Secondary | ICD-10-CM

## 2019-10-15 DIAGNOSIS — I69312 Visuospatial deficit and spatial neglect following cerebral infarction: Secondary | ICD-10-CM

## 2019-10-15 DIAGNOSIS — F1290 Cannabis use, unspecified, uncomplicated: Secondary | ICD-10-CM | POA: Diagnosis present

## 2019-10-15 DIAGNOSIS — Z955 Presence of coronary angioplasty implant and graft: Secondary | ICD-10-CM | POA: Diagnosis not present

## 2019-10-15 DIAGNOSIS — G4700 Insomnia, unspecified: Secondary | ICD-10-CM | POA: Diagnosis present

## 2019-10-15 DIAGNOSIS — D72829 Elevated white blood cell count, unspecified: Secondary | ICD-10-CM | POA: Diagnosis not present

## 2019-10-15 DIAGNOSIS — E1159 Type 2 diabetes mellitus with other circulatory complications: Secondary | ICD-10-CM | POA: Diagnosis present

## 2019-10-15 DIAGNOSIS — F411 Generalized anxiety disorder: Secondary | ICD-10-CM

## 2019-10-15 DIAGNOSIS — E876 Hypokalemia: Secondary | ICD-10-CM | POA: Diagnosis not present

## 2019-10-15 DIAGNOSIS — E119 Type 2 diabetes mellitus without complications: Secondary | ICD-10-CM

## 2019-10-15 DIAGNOSIS — D509 Iron deficiency anemia, unspecified: Secondary | ICD-10-CM | POA: Diagnosis present

## 2019-10-15 DIAGNOSIS — Z888 Allergy status to other drugs, medicaments and biological substances status: Secondary | ICD-10-CM

## 2019-10-15 DIAGNOSIS — I69393 Ataxia following cerebral infarction: Secondary | ICD-10-CM | POA: Diagnosis not present

## 2019-10-15 DIAGNOSIS — K921 Melena: Secondary | ICD-10-CM | POA: Diagnosis not present

## 2019-10-15 DIAGNOSIS — I252 Old myocardial infarction: Secondary | ICD-10-CM

## 2019-10-15 DIAGNOSIS — I10 Essential (primary) hypertension: Secondary | ICD-10-CM | POA: Diagnosis present

## 2019-10-15 DIAGNOSIS — R2689 Other abnormalities of gait and mobility: Secondary | ICD-10-CM | POA: Diagnosis present

## 2019-10-15 DIAGNOSIS — Z6829 Body mass index (BMI) 29.0-29.9, adult: Secondary | ICD-10-CM | POA: Diagnosis not present

## 2019-10-15 DIAGNOSIS — I480 Paroxysmal atrial fibrillation: Secondary | ICD-10-CM | POA: Diagnosis present

## 2019-10-15 DIAGNOSIS — Z794 Long term (current) use of insulin: Secondary | ICD-10-CM

## 2019-10-15 DIAGNOSIS — Z8042 Family history of malignant neoplasm of prostate: Secondary | ICD-10-CM

## 2019-10-15 DIAGNOSIS — F419 Anxiety disorder, unspecified: Secondary | ICD-10-CM | POA: Diagnosis present

## 2019-10-15 DIAGNOSIS — E1140 Type 2 diabetes mellitus with diabetic neuropathy, unspecified: Secondary | ICD-10-CM | POA: Diagnosis present

## 2019-10-15 DIAGNOSIS — E782 Mixed hyperlipidemia: Secondary | ICD-10-CM | POA: Diagnosis present

## 2019-10-15 DIAGNOSIS — I255 Ischemic cardiomyopathy: Secondary | ICD-10-CM | POA: Diagnosis present

## 2019-10-15 DIAGNOSIS — K625 Hemorrhage of anus and rectum: Secondary | ICD-10-CM | POA: Diagnosis present

## 2019-10-15 DIAGNOSIS — I152 Hypertension secondary to endocrine disorders: Secondary | ICD-10-CM | POA: Diagnosis present

## 2019-10-15 DIAGNOSIS — Z886 Allergy status to analgesic agent status: Secondary | ICD-10-CM

## 2019-10-15 DIAGNOSIS — I4891 Unspecified atrial fibrillation: Secondary | ICD-10-CM | POA: Diagnosis present

## 2019-10-15 DIAGNOSIS — Z7982 Long term (current) use of aspirin: Secondary | ICD-10-CM

## 2019-10-15 LAB — GLUCOSE, CAPILLARY
Glucose-Capillary: 169 mg/dL — ABNORMAL HIGH (ref 70–99)
Glucose-Capillary: 139 mg/dL — ABNORMAL HIGH (ref 70–99)
Glucose-Capillary: 134 mg/dL — ABNORMAL HIGH (ref 70–99)
Glucose-Capillary: 162 mg/dL — ABNORMAL HIGH (ref 70–99)

## 2019-10-15 MED ORDER — GABAPENTIN 100 MG PO CAPS
100.0000 mg | ORAL_CAPSULE | Freq: Every day | ORAL | Status: DC
  Administered 2019-10-15 – 2019-10-22 (×8): 100 mg via ORAL
  Filled 2019-10-15 (×8): qty 1

## 2019-10-15 MED ORDER — CLOPIDOGREL BISULFATE 75 MG PO TABS
75.0000 mg | ORAL_TABLET | Freq: Every day | ORAL | Status: DC
  Administered 2019-10-16: 75 mg via ORAL
  Filled 2019-10-15: qty 1

## 2019-10-15 MED ORDER — METHOCARBAMOL 500 MG PO TABS: 500.0000 mg | ORAL_TABLET | Freq: Three times a day (TID) | ORAL | Status: DC | PRN

## 2019-10-15 MED ORDER — TRAZODONE HCL 50 MG PO TABS
25.0000 mg | ORAL_TABLET | Freq: Every evening | ORAL | Status: DC | PRN
  Administered 2019-10-16 – 2019-10-22 (×6): 50 mg via ORAL
  Filled 2019-10-15 (×6): qty 1

## 2019-10-15 MED ORDER — ASPIRIN EC 81 MG PO TBEC
81.0000 mg | DELAYED_RELEASE_TABLET | Freq: Every day | ORAL | Status: DC
  Administered 2019-10-16 – 2019-10-23 (×8): 81 mg via ORAL
  Filled 2019-10-15 (×9): qty 1

## 2019-10-15 MED ORDER — CLONAZEPAM 0.25 MG PO TBDP
1.0000 mg | ORAL_TABLET | Freq: Two times a day (BID) | ORAL | Status: DC | PRN
  Administered 2019-10-15: 1 mg via ORAL
  Filled 2019-10-15: qty 4

## 2019-10-15 MED ORDER — DIPHENHYDRAMINE HCL 12.5 MG/5ML PO ELIX: 12.5000 mg | ORAL_SOLUTION | Freq: Four times a day (QID) | ORAL | Status: DC | PRN

## 2019-10-15 MED ORDER — INSULIN ASPART 100 UNIT/ML ~~LOC~~ SOLN
0.0000 [IU] | Freq: Every day | SUBCUTANEOUS | Status: DC
  Administered 2019-10-18: 2 [IU] via SUBCUTANEOUS

## 2019-10-15 MED ORDER — PROCHLORPERAZINE EDISYLATE 10 MG/2ML IJ SOLN: 5.0000 mg | Freq: Four times a day (QID) | INTRAMUSCULAR | Status: DC | PRN

## 2019-10-15 MED ORDER — PNEUMOCOCCAL VAC POLYVALENT 25 MCG/0.5ML IJ INJ
0.5000 mL | INJECTION | INTRAMUSCULAR | Status: AC
  Administered 2019-10-16: 0.5 mL via INTRAMUSCULAR
  Filled 2019-10-15: qty 0.5

## 2019-10-15 MED ORDER — PROCHLORPERAZINE 25 MG RE SUPP: 12.5000 mg | Freq: Four times a day (QID) | RECTAL | Status: DC | PRN

## 2019-10-15 MED ORDER — LISINOPRIL 5 MG PO TABS
2.5000 mg | ORAL_TABLET | Freq: Every day | ORAL | Status: DC
  Administered 2019-10-16 – 2019-10-23 (×8): 2.5 mg via ORAL
  Filled 2019-10-15 (×9): qty 1

## 2019-10-15 MED ORDER — EZETIMIBE 10 MG PO TABS
10.0000 mg | ORAL_TABLET | Freq: Every day | ORAL | Status: DC
  Administered 2019-10-15 – 2019-10-22 (×8): 10 mg via ORAL
  Filled 2019-10-15 (×8): qty 1

## 2019-10-15 MED ORDER — NITROGLYCERIN 0.4 MG SL SUBL: 0.4000 mg | SUBLINGUAL_TABLET | SUBLINGUAL | Status: DC | PRN

## 2019-10-15 MED ORDER — BACLOFEN 5 MG HALF TABLET
5.0000 mg | ORAL_TABLET | Freq: Three times a day (TID) | ORAL | Status: DC | PRN
  Administered 2019-10-15 – 2019-10-22 (×4): 5 mg via ORAL
  Filled 2019-10-15 (×4): qty 1

## 2019-10-15 MED ORDER — ACETAMINOPHEN 325 MG PO TABS
325.0000 mg | ORAL_TABLET | ORAL | Status: DC | PRN
  Administered 2019-10-18 – 2019-10-22 (×4): 650 mg via ORAL
  Filled 2019-10-15 (×4): qty 2

## 2019-10-15 MED ORDER — BISACODYL 10 MG RE SUPP: 10.0000 mg | Freq: Every day | RECTAL | Status: DC | PRN

## 2019-10-15 MED ORDER — INSULIN ASPART 100 UNIT/ML ~~LOC~~ SOLN: 0.0000 [IU] | Freq: Every day | SUBCUTANEOUS | 11 refills | Status: DC

## 2019-10-15 MED ORDER — METOPROLOL TARTRATE 50 MG PO TABS
75.0000 mg | ORAL_TABLET | Freq: Two times a day (BID) | ORAL | Status: DC
  Administered 2019-10-15 – 2019-10-23 (×16): 75 mg via ORAL
  Filled 2019-10-15 (×16): qty 1

## 2019-10-15 MED ORDER — POLYETHYLENE GLYCOL 3350 17 G PO PACK: 17.0000 g | PACK | Freq: Every day | ORAL | Status: DC | PRN

## 2019-10-15 MED ORDER — ROSUVASTATIN CALCIUM 20 MG PO TABS
20.0000 mg | ORAL_TABLET | Freq: Every day | ORAL | Status: DC
  Administered 2019-10-16 – 2019-10-23 (×8): 20 mg via ORAL
  Filled 2019-10-15 (×8): qty 1

## 2019-10-15 MED ORDER — INSULIN ASPART 100 UNIT/ML ~~LOC~~ SOLN
0.0000 [IU] | Freq: Three times a day (TID) | SUBCUTANEOUS | Status: DC
  Administered 2019-10-15: 1 [IU] via SUBCUTANEOUS
  Administered 2019-10-16: 2 [IU] via SUBCUTANEOUS
  Administered 2019-10-16 (×2): 1 [IU] via SUBCUTANEOUS
  Administered 2019-10-17: 2 [IU] via SUBCUTANEOUS
  Administered 2019-10-17 – 2019-10-18 (×3): 1 [IU] via SUBCUTANEOUS
  Administered 2019-10-18 – 2019-10-19 (×5): 2 [IU] via SUBCUTANEOUS
  Administered 2019-10-20 – 2019-10-23 (×2): 1 [IU] via SUBCUTANEOUS

## 2019-10-15 MED ORDER — PROCHLORPERAZINE MALEATE 5 MG PO TABS: 5.0000 mg | ORAL_TABLET | Freq: Four times a day (QID) | ORAL | Status: DC | PRN

## 2019-10-15 MED ORDER — FLEET ENEMA 7-19 GM/118ML RE ENEM: 1.0000 | ENEMA | Freq: Once | RECTAL | Status: DC | PRN

## 2019-10-15 MED ORDER — GUAIFENESIN-DM 100-10 MG/5ML PO SYRP: 5.0000 mL | ORAL_SOLUTION | Freq: Four times a day (QID) | ORAL | Status: DC | PRN

## 2019-10-15 MED ORDER — ALUM & MAG HYDROXIDE-SIMETH 200-200-20 MG/5ML PO SUSP: 30.0000 mL | ORAL | Status: DC | PRN

## 2019-10-15 MED ORDER — INSULIN ASPART 100 UNIT/ML ~~LOC~~ SOLN: 0.0000 [IU] | Freq: Three times a day (TID) | SUBCUTANEOUS | 11 refills | Status: DC

## 2019-10-15 MED ORDER — CITALOPRAM HYDROBROMIDE 10 MG PO TABS
5.0000 mg | ORAL_TABLET | Freq: Every day | ORAL | Status: DC
  Administered 2019-10-16 – 2019-10-23 (×8): 5 mg via ORAL
  Filled 2019-10-15 (×9): qty 1

## 2019-10-15 NOTE — Progress Notes (Signed)
Raechel Ache, OT  Rehab Admission Coordinator  Physical Medicine and Rehabilitation  PMR Pre-admission     Signed  Date of Service:  10/13/2019  7:21 AM      Related encounter: ED to Hosp-Admission (Current) from 10/08/2019 in [facility]      Signed       Show:Clear all _0 Manual_1 Template_2 Copied  Added by: _3 Raechel Ache, OT  _4 Hover for details PMR Admission Coordinator Pre-Admission Assessment   Patient: John Douglas is an 64 y.o., male MRN: [id number] DOB: [birth year] Height: _5  (177.8 cm) Weight: 99.8 kg                                                                                                                                                  Insurance Information HMO:     PPO: yes     PCP:      IPA:      80/20:      OTHER:  PRIMARY: [employer] Medicare      Policy#: [id number]      Subscriber: patient CM Name: Manuela Schwartz       Phone#: [phone]     Fax#: [phone] Pre-Cert#: [id number]      Employer: [employer] provided by Manuela Schwartz for admit to CIR. Pt is approved for 7 days with start date 6/17. Next review date is 6/24. Updates go to [place] (p): [phone] (f): [phone]  Benefits:  Phone #: online     Name: [email or web address] Eff. Date: 04/30/17-04/29/20     Deduct: $0 -doesn't have one      Out of Pocket Max: $1,000 ($380 met)      Life Max: NA  CIR: $150 co-pay/admission       SNF: 100% coverage, 0% co-insurance; limited by medical necessity review; limited to 100 days/cal yr Outpatient: $20 co-pay/visit; limited by medical necessity    Home Health: 100% coverage, 0% co-insurance; limited by medical necessity review DME: 100% coverage, 0% co-insurance     Providers:  SECONDARY: None      Policy#:       Phone#:    Development worker, community:       Phone#:    The Engineer, petroleum" for patients in Inpatient Rehabilitation Facilities with attached "Privacy Act [place] Records" was provided and verbally reviewed with:  Patient and Family   Emergency Contact Information Contact Information     Name Relation Home Work Mobile    [place] Daughter [phone]   [phone]    Lynch,Carolyn Daughter     [phone]    [facility] Significant other [phone]   [phone]       Current Medical History  Patient Admitting Diagnosis: 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 male with history of HTN, Wide complex tachycardia/AF- no AC, T2DM with  neuropathy, hematochezia with colonoscopy 06/09 positive for rectal adenocarcinoma who was admitted via [facility] on 10/08/19 with dizziness, several day history of weakness followed by LLE weakness with difficulty walking.  He was found to have A fib with RVR and CT head done revealing right PCA infarct with petechial hemorrhage. CTA head/neck showed normal intracranial and cervical arteries. Patient off Eliquis X 1 month-->on DAPT due to bloody stools. Dr. Leonie Man felt that stroke embolic due to A fib but questioned hypercoagulopathy from new dx CA. No DAPT due to petechial hemorrhage and AC with eliquis at d/c if no rectal bleeding noted. Dr. Martha Clan consulted for input on new diagnosis rectal cancer and recommended pan CT as well as CEA for work up.  CT chest, abdomen/pelvis done for staging and showed retroperitoneal and upper pelvic lymphadenopathy, hyperenhancing focus left hepatic lobe with surrounding parenchymal attenuation on venous phase--felt to be hemangioma and mild distal circumferential esophageal thickening. MRI pelvis showed rectal adenocarcinoma T4 N2 with tumor 4 cm distance from anal sphincter. Work up underway and CCS consulted for input. Per Dr. Marin Olp, the pt is not a surgical candidate at this time and they will proceed with systemic therapy most likely in July. Therapy evaluations completed revealing impulsivity with poor safety awareness, LLE weakness with decreased coordination  and motor planning deficits. CIR recommended due to functional deficits. Pt is to admit to CIR today, 10/15/19.        Complete NIHSS TOTAL: 2   Past Medical History  Past Medical History:  Diagnosis Date  . Allergic rhinitis      [facility]  . Anxiety    . Atrial fibrillation ([place])      [facility]  . Benign essential hypertension      [facility]  . Coronary artery disease      [facility]  . Diabetic neuropathy, type II diabetes mellitus ([place])      [facility]  . Mixed hyperlipidemia      [facility]  . Myocardial infarct [facility])      [facility]  . Obesity, unspecified       [facility]  . Rectal bleeding       [facility]  . Wide-complex tachycardia ([place]) 06/04/2019      Family History  family history includes Diverticulitis in his mother; Heart disease in his father; Hypertension in his father; Prostate cancer in his father.   Prior Rehab/Hospitalizations:  Has the patient had prior rehab or hospitalizations prior to admission? Yes   Has the patient had major surgery during 100 days prior to admission? Yes   Current Medications    Current Facility-Administered Medications:  .  acetaminophen (TYLENOL) tablet 650 mg, 650 mg, Oral, Q4H PRN, 650 mg at 10/11/19 0712 **OR** acetaminophen (TYLENOL) 160 MG/5ML solution 650 mg, 650 mg, Per Tube, Q4H PRN **OR** acetaminophen (TYLENOL) suppository 650 mg, 650 mg, Rectal, Q4H PRN, Pahwani, Rinka R, MD .  aspirin chewable tablet 81 mg, 81 mg, Oral, Daily, Pahwani, Rinka R, MD, 81 mg at 10/15/19 0918 .  clonazePAM (KLONOPIN) disintegrating tablet 1 mg, 1 mg, Oral, BID PRN, Barb Merino, MD, 1 mg at 10/15/19 0544 .  clopidogrel (PLAVIX) tablet 75 mg, 75 mg, Oral, Daily, Barb Merino, MD, 75 mg at 10/15/19 0918 .  ezetimibe (ZETIA) tablet 10 mg, 10 mg,  Oral, QHS, Pahwani, Rinka R, MD, 10 mg at 10/14/19 2232 .  insulin aspart (novoLOG) injection 0-5 Units, 0-5 Units, Subcutaneous, QHS, Ghimire, Kuber, MD .  insulin aspart (novoLOG) injection 0-9 Units, 0-9 Units, Subcutaneous, TID WC, Barb Merino, MD, 1 Units at 10/15/19 1211 .  lisinopril (ZESTRIL) tablet 2.5 mg, 2.5 mg, Oral, Daily, Ghimire, Kuber, MD, 2.5 mg at 10/15/19 0918 .  methocarbamol (ROBAXIN) tablet 500 mg, 500 mg, Oral, Q8H PRN, Opyd, Ilene Qua, MD, 500 mg at 10/15/19 0357 .  metoprolol tartrate (LOPRESSOR) tablet 75 mg, 75 mg, Oral, BID, Ghimire, Kuber, MD, 75 mg at 10/15/19 0917 .  nitroGLYCERIN (NITROSTAT) SL tablet 0.4 mg, 0.4 mg, Sublingual, Q5 min PRN, Pahwani, Rinka R, MD .  rosuvastatin (CRESTOR) tablet 20 mg, 20 mg, Oral, Daily, Pahwani, Rinka R, MD, 20 mg at 10/15/19 0918 .  sodium chloride flush (NS) 0.9 % injection 10 mL, 10 mL, Intravenous, PRN, Barb Merino, MD, 10 mL at 10/09/19 0939   Patients Current Diet:     Diet Order                      Diet - low sodium heart healthy              Diet Carb Modified              Diet Carb Modified Fluid consistency: Thin; Room service appropriate? Yes  Diet effective now                      Precautions / Restrictions Precautions Precautions: Fall Restrictions Weight Bearing Restrictions: No    Has the patient had 2 or more falls or a fall with injury in the past year?No   Prior Activity Level Community (5-7x/wk): Pt. was working and leaving home most days.   Prior Functional Level Prior Function Level of Independence: Independent Comments: pt reports working, driving, and independence in all ADLs   Self Care: Did the patient need help bathing, dressing, using the toilet or eating?  Independent   Indoor Mobility: Did the patient need assistance with walking from room to room (with or without device)? Independent   Stairs: Did the patient need assistance with internal or external stairs (with or  without device)? Independent   Functional Cognition: Did the patient need help planning regular tasks such as shopping or remembering to take medications? Independent   Home Assistive Devices / Equipment Home Assistive Devices/Equipment: Eyeglasses Home Equipment: None   Prior Device Use: Indicate devices/aids used by the patient prior to current illness, exacerbation or injury? None of the above   Current Functional Level Cognition   Arousal/Alertness: Awake/alert Overall Cognitive Status: Impaired/Different from baseline Current Attention Level: Selective Orientation Level: Oriented X4 Following Commands: Follows one step commands consistently, Follows multi-step commands inconsistently Safety/Judgement: Decreased awareness of deficits General Comments: Printmaker (TMT) parts A & B and pt presented deficits in STM, decreased ability to sequence, decreased ability to follow multiple step commands, and decreased awareness of deficits. Attention: Selective Selective Attention: Appears intact Memory: Appears intact Awareness: Impaired Awareness Impairment: Emergent impairment Problem Solving: Impaired Problem Solving Impairment: Functional complex Safety/Judgment: Impaired    Extremity Assessment (includes Sensation/Coordination)   Upper Extremity Assessment: Overall WFL for tasks assessed LUE Deficits / Details: No deficits in strength. decreased coordination noted in finger to nose.  LUE Sensation: WNL LUE Coordination: decreased fine motor, decreased gross motor  Lower Extremity Assessment: Defer to PT evaluation LLE Sensation: decreased light touch LLE Coordination: decreased gross motor  ADLs   Overall ADL's : Needs assistance/impaired Grooming: Oral care, Minimal assistance, With adaptive equipment, Standing Grooming Details (indicate cue type and reason): Min A for steadying at sink while standing  Upper Body Bathing: Supervision/ safety,  Sitting Upper Body Bathing Details (indicate cue type and reason): supervision for safety Lower Body Bathing: Supervison/ safety, Sitting/lateral leans Lower Body Bathing Details (indicate cue type and reason): supervision for safety Upper Body Dressing : Supervision/safety, Sitting Upper Body Dressing Details (indicate cue type and reason): supervision for safety Lower Body Dressing: Minimal assistance, Sit to/from stand Lower Body Dressing Details (indicate cue type and reason): Min A to help with balance Toilet Transfer: Minimal assistance, Ambulation, Regular Toilet Toilet Transfer Details (indicate cue type and reason): Min A to steady upon standing Toileting- Clothing Manipulation and Hygiene: Minimal assistance Toileting - Clothing Manipulation Details (indicate cue type and reason): Min A to steady Tub/Shower Transfer Details (indicate cue type and reason): deferred assess next session Functional mobility during ADLs: Minimal assistance, Rolling walker, Cueing for safety General ADL Comments: Pt requires supervision when seated. Pt has decreased balance and is impoulsive and unsafe with walker. Needs verbal cues and min A to complete ADLs in standing.      Mobility   Overal bed mobility: Needs Assistance Bed Mobility: Supine to Sit, Sit to Supine Supine to sit: Supervision Sit to supine: Supervision General bed mobility comments: supervision for safety.      Transfers   Overall transfer level: Needs assistance Equipment used: Rolling walker (2 wheeled), 1 person hand held assist Transfers: Sit to/from Stand Sit to Stand: Min assist General transfer comment: Practiced standing with both HHA and RW. Pt requiring min A for steadying.      Ambulation / Gait / Stairs / Wheelchair Mobility   Ambulation/Gait Ambulation/Gait assistance: Mod assist, Min assist Gait Distance (Feet): 100 Feet Assistive device: Rolling walker (2 wheeled) Gait Pattern/deviations: Decreased step length -  left, Wide base of support, Step-through pattern, Decreased dorsiflexion - left, Decreased stride length General Gait Details: Cues for step height and heel strike on the L. Required safety cues for safe use of RW. With increased distance, pt requiring increased support. Min to mod A for steadying.  Gait velocity: Decreased     Posture / Balance Dynamic Sitting Balance Sitting balance - Comments: no assist needed for sitting balance Balance Overall balance assessment: Needs assistance Sitting-balance support: Feet supported Sitting balance-Leahy Scale: Good Sitting balance - Comments: no assist needed for sitting balance Standing balance support: During functional activity, Single extremity supported, Bilateral upper extremity supported Standing balance-Leahy Scale: Poor Standing balance comment: [place] BUE support and external assist.      Special needs/care consideration Diabetic management: yes, Special service needs: pt may require chemotherapy after CIR and other oncologic needs  and Designated visitor : TBD once admitted.         Previous Home Environment (from acute therapy documentation) Living Arrangements: Alone  Lives With: Alone Available Help at Discharge: Family, Available PRN/intermittently Type of Home: House Home Layout: One level Home Access: Stairs to enter Entrance Stairs-Rails: None Entrance Stairs-Number of Steps: 1 Bathroom Shower/Tub: Optometrist: Yes How Accessible: Accessible via walker [place]: No Additional Comments: family reports availability to cane and wc if needed   Discharge Living Setting Plans for Discharge Living Setting: Patient's home (May potentially rent a more accessibe house beside daughter) Type of Home at Discharge: House Discharge Home Layout: One level Discharge Home  Access: Stairs to enter Entrance Stairs-Rails: None Entrance Stairs-Number of Steps: 1 Discharge  Bathroom Shower/Tub: Tub/shower unit Discharge Bathroom Toilet: Standard Discharge Bathroom Accessibility: Yes Does the patient have any problems obtaining your medications?: No   Social/Family/Support Systems Patient Roles: Other (Comment) Contact Information: [phone] Anticipated Caregiver: Graydon Fofana (daughter) Anticipated Caregiver's Contact Information: [phone] Ability/Limitations of Caregiver:  Supervision  Caregiver Availability: intermittant Discharge Plan Discussed with Primary Caregiver: Yes Is Caregiver In Agreement with Plan?: Yes Does Caregiver/Family have Issues with Lodging/Transportation while Pt is in Rehab?: No     Goals Patient/Family Goal for Rehab:  (PT/OT/SLP mod I) Expected length of stay: 5-7 days Pt/Family Agrees to Admission and willing to participate: Yes Program Orientation Provided & Reviewed with Pt/Caregiver Including Roles  & Responsibilities: Yes  Barriers to Discharge: Pending chemo/radiation     Decrease burden of Care through IP rehab admission: NA     Possible need for SNF placement upon discharge:Not anticipated; pt has an excellent prognosis in the near term for functional progress through CIR program with anticipated goals of Mod I. Per pt's daughter they can stay with him at DC for a few days for support as needed.      Patient Condition: This patient's medical and functional status has changed since the consult dated 10/12/19 in which the Rehabilitation Physician determined and documented that the patient was potentially appropriate for intensive rehabilitative care in an inpatient rehabilitation facility. Issues have been addressed and update has been discussed with Dr. Naaman Plummer and patient now appropriate for inpatient rehabilitation as he is still at a Min A level with functional needs. It appears that his acute oncology workup has been completed and he will begin oncology therapy in July (after CIR stay). Will admit to inpatient  rehab today.   Preadmission Screen Completed By:  Raechel Ache, OT, 10/15/2019 1:22 PM ______________________________________________________________________     Discussed status with Dr. Ranell Patrick on 10/15/19 at 1:19PM and received approval for admission today.   Admission Coordinator:  Raechel Ache, time 1:19PM/Date 10/15/19               Cosigned by: Izora Ribas, MD at 10/15/2019  1:30 PM  Revision History                Note Details  Author Raechel Ache, OT File Time 10/15/2019  1:23 PM  Author Type Rehab Admission Coordinator Status Signed  Last Editor Raechel Ache, OT Service Physical Medicine and Rehabilitation

## 2019-10-15 NOTE — Progress Notes (Signed)
Charlett Blake, MD  Physician  Physical Medicine and Rehabilitation  Consult Note     Signed  Date of Service:  10/12/2019 10:02 AM      Related encounter: ED to Hosp-Admission (Current) from 10/08/2019 in [place] 3W [facility]      Signed      Expand All Collapse All  Show:Clear all [x] Manual[x] Template[] Copied  Added by: [x] Kirsteins, Luanna Salk, MD[x] Love, Ivan Anchors, PA-C  [] Hover for details          Physical Medicine and Rehabilitation Consult     Reason for Consult: Stroke with functional deficits.  Referring Physician: Dr. Sloan Leiter     HPI: Ronda Kazmi is a 64 y.o. 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  He was found to have A fib with RVR and CT head done revealing right PCA infarct with petechial hemorrhage. CTA head/neck showed normal intracranial and cervical arteries. Patient off Eliquis X 1 month-->on DAPT due to bloody stools. Dr. Leonie Man felt that stroke embolic due to A fib but questioned hypercoagulopathy from new dx CA. No DAPT due to petechial hemorrhage and AC with eliquis at d/c if no rectal bleeding noted.     Dr. Martha Clan consulted for input on new diagnosis rectal cancer and recommended pan CT as well as CEA for work up.  CT chest, abdomen/pelvis done for staging and showed retroperitoneal and upper pelvic lymphadenopathy, hyperenhancing focus left hepatic lobe with surrounding parenchymal attenuation on venous phase--felt to be hemangioma and mild distal circumferential esophageal thickening. MRI pelvis showed rectal adenocarcinoma T4 N2 with tumor 4 cm distance from anal sphincter. Work up underway and CCS consulted for input. Therapy evaluations completed revealing impulsivity with poor safety  awareness, LLE weakness with decreased coordination and motor planning deficits. CIR recommended due to functional deficits.      Review of Systems  Constitutional: Negative.  Negative for chills, diaphoresis and fever.  HENT: Negative.  Negative for congestion and nosebleeds.   Eyes: Negative for pain, discharge and redness.  Respiratory: Negative for cough, hemoptysis, sputum production and stridor.   Cardiovascular: Negative for chest pain and orthopnea.  Gastrointestinal: Positive for blood in stool. Negative for nausea and vomiting.  Genitourinary: Negative for dysuria and frequency.  Musculoskeletal: Negative for back pain.  Skin: Negative.  Negative for itching.  Neurological: Positive for weakness.  Endo/Heme/Allergies: Does not bruise/bleed easily.  Psychiatric/Behavioral: Negative for memory loss and substance abuse. The patient is not nervous/anxious.             Past Medical History:  Diagnosis Date  . Allergic rhinitis      [facility]  . Anxiety    . Atrial fibrillation ([place])      [facility]  . Benign essential hypertension      [facility]  . Coronary artery disease      [facility]  . Diabetic neuropathy, type II diabetes mellitus ([place])      [facility]  . Mixed hyperlipidemia      [facility]  . Myocardial infarct [facility])      [facility]  . Obesity, unspecified       [facility]  . Rectal bleeding  [place]  . Wide-complex tachycardia ([place]) 06/04/2019           Past Surgical History:  Procedure Laterality Date  . BIOPSY   10/07/2019    Procedure: BIOPSY;  Surgeon: Lavena Bullion, DO;  Location: [facility] ENDOS[facility];  Service: Gastroenterology;;  . COLONOSCOPY WITH PROPOFOL N/A 10/07/2019    Procedure: COLONOSCOPY WITH PROPOFOL;  Surgeon: Lavena Bullion, DO;  Location: [facility]  ENDOSCOPY;  Service: Gastroenterology;  Laterality: N/A;  . CORONARY ANGIOPLASTY        3 stents  . LEFT HEART CATH AND CORONARY ANGIOGRAPHY N/A 06/05/2019    Procedure: LEFT HEART CATH AND CORONARY ANGIOGRAPHY;  Surgeon: Nelva Bush, MD;  Location: [place] [facility];  Service: Cardiovascular;  Laterality: N/A;  . POLYPECTOMY   10/07/2019    Procedure: POLYPECTOMY;  Surgeon: Lavena Bullion, DO;  Location: [facility];  Service: Gastroenterology;;  . Lia Foyer TATTOO INJECTION   10/07/2019    Procedure: SUBMUCOSAL TATTOO INJECTION;  Surgeon: Lavena Bullion, DO;  Location: [facility];  Service: Gastroenterology;;           Family History  Problem Relation Age of Onset  . Diverticulitis Mother    . Hypertension Father    . Heart disease Father          MI  . Prostate cancer Father    . Colon cancer Neg Hx    . Stomach cancer Neg Hx    . Pancreatic cancer Neg Hx    . Rectal cancer Neg Hx        Social History:  Divorced- lives alone and independent PTA. Retired--worked for [employer]. He reports that he has never smoked. He quit smokeless tobacco use about 5 months ago.  His smokeless tobacco use included chew. He reports current alcohol use. He reports current drug use. Drug: Marijuana.          Allergies  Allergen Reactions  . Ibuprofen Other (See Comments)      Was told to not take this   . Metformin And Related        Bloody stools   . Naproxen Other (See Comments)      Was told to not take this   . Penicillins Hives      Did it involve swelling of the face/tongue/throat, SOB, or low BP? Unk Did it involve sudden or severe rash/hives, skin peeling, or any reaction on the inside of your mouth or nose? Yes Did you need to seek medical attention at a hospital or doctor's office? Unk When did it last happen? "Childhood- 55 years ago" If all above answers are "NO", may proceed with cephalosporin use.    . [place] Venom [Bee Venom] Swelling  and Other (See Comments)      Severe swelling where stung            Medications Prior to Admission  Medication Sig Dispense Refill  . aspirin 81 MG chewable tablet Chew 1 tablet (81 mg total) by mouth daily.      . clonazePAM (KLONOPIN) 1 MG disintegrating tablet Take 1 tablet (1 mg total) by mouth 2 (two) times daily as needed (pre-colonoscopy anxiety). 6 tablet 0  . clopidogrel (PLAVIX) 75 MG tablet Take 1 tablet (75 mg total) by mouth daily. 90 tablet 3  . ezetimibe (ZETIA) 10 MG tablet Take 10 mg by mouth at bedtime.      . fluticasone (FLONASE) 50 MCG/ACT nasal spray Place  1-2 sprays into both nostrils daily as needed for allergies or rhinitis (or seasonal allergies).      Marland Kitchen lisinopril (ZESTRIL) 2.5 MG tablet Take 2.5 mg by mouth daily.      . metoprolol tartrate (LOPRESSOR) 50 MG tablet Take 1.5 tablets (75 mg total) by mouth 2 (two) times daily. 270 tablet 3  . nitroGLYCERIN (NITROSTAT) 0.4 MG SL tablet Place 0.4 mg under the tongue every 5 (five) minutes as needed for chest pain.       . rosuvastatin (CRESTOR) 20 MG tablet Take 1 tablet (20 mg total) by mouth daily. 90 tablet 2      Home: Home Living Family/patient expects to be discharged to:: Private residence Living Arrangements: Alone Available Help at Discharge: Family, Available PRN/intermittently Type of Home: House Home Access: Stairs to enter Technical brewer of Steps: 1 Entrance Stairs-Rails: None Home Layout: One level Bathroom Shower/Tub: Optometrist: Yes Home Equipment: None Additional Comments: family reports availability to cane and wc if needed  Lives With: Alone  Functional History: Prior Function Level of Independence: Independent Comments: pt reports working, driving, and independence in all ADLs Functional Status:  Mobility: Bed Mobility Overal bed mobility: Needs Assistance Bed Mobility: Sit to Supine Supine to sit: Min assist Sit to  supine: Supervision General bed mobility comments: supervision for safety Transfers Overall transfer level: Needs assistance Equipment used: Rolling walker (2 wheeled) Transfers: Sit to/from Stand Sit to Stand: Min assist General transfer comment: Pt unsafe with walker reuires verbal cues for safety and min A to steady Ambulation/Gait Ambulation/Gait assistance: Mod assist, Min assist Gait Distance (Feet): 45 Feet Assistive device: Rolling walker (2 wheeled), 1 person hand held assist Gait Pattern/deviations: Decreased step length - left, Shuffle, Wide base of support, Trunk flexed General Gait Details: patient initially ambulated 5 feet with hand held assist. Leaning moderately on me for support. Rest of gait training he used RW. Noted to have left LE lagging, decreased foot clearance. Improved with cues to take exaggerated step on left. Patient with motor planing difficulties and poor coordination Gait velocity: decr   ADL: ADL Overall ADL's : Needs assistance/impaired Grooming: Oral care, Minimal assistance, With adaptive equipment, Standing Grooming Details (indicate cue type and reason): Min A for steadying at sink while standing  Upper Body Bathing: Supervision/ safety, Sitting Upper Body Bathing Details (indicate cue type and reason): supervision for safety Lower Body Bathing: Supervison/ safety, Sitting/lateral leans Lower Body Bathing Details (indicate cue type and reason): supervision for safety Upper Body Dressing : Supervision/safety, Sitting Upper Body Dressing Details (indicate cue type and reason): supervision for safety Lower Body Dressing: Minimal assistance, Sit to/from stand Lower Body Dressing Details (indicate cue type and reason): Min A to help with balance Toilet Transfer: Minimal assistance, Ambulation, Regular Toilet Toilet Transfer Details (indicate cue type and reason): Min A to steady upon standing Toileting- Clothing Manipulation and Hygiene: Minimal  assistance Toileting - Clothing Manipulation Details (indicate cue type and reason): Min A to steady Tub/Shower Transfer Details (indicate cue type and reason): deferred assess next session Functional mobility during ADLs: Minimal assistance, Rolling walker, Cueing for safety General ADL Comments: Pt requires supervision when seated. Pt has decreased balance and is impoulsive and unsafe with walker. Needs verbal cues and min A to complete ADLs in standing.    Cognition: Cognition Overall Cognitive Status: Impaired/Different from baseline Arousal/Alertness: Awake/alert Orientation Level: Oriented X4 Attention: Selective Selective Attention: Appears intact Memory: Appears intact Awareness: Impaired Awareness Impairment:  Emergent impairment Problem Solving: Impaired Problem Solving Impairment: Functional complex Safety/Judgment: Impaired Cognition Arousal/Alertness: Awake/alert Behavior During Therapy: WFL for tasks assessed/performed, Impulsive Overall Cognitive Status: Impaired/Different from baseline Area of Impairment: Attention, Following commands, Safety/judgement, Problem solving Current Attention Level: Selective Following Commands: Follows one step commands consistently, Follows multi-step commands inconsistently Safety/Judgement: Decreased awareness of deficits, Decreased awareness of safety Problem Solving: Requires verbal cues General Comments: pt impulsive with walker and had decreased awareness of safety when ambulating     Blood pressure 138/74, pulse 64, temperature 98.9 F (37.2 C), temperature source Oral, resp. rate 17, height 5\' 10"  (1.778 m), weight 99.8 kg, SpO2 95 %. Physical Exam  Nursing note and vitals reviewed. Constitutional: He is oriented to person, place, and time.  HENT:  Head: Normocephalic and atraumatic.  Nose: Nose normal.  Mouth/Throat: Mucous membranes are moist.  Eyes: Pupils are equal, round, and reactive to light. Conjunctivae are normal.    Cardiovascular: Normal rate, regular rhythm and normal heart sounds.  No murmur heard. Respiratory: Effort normal and breath sounds normal. No stridor. No respiratory distress.  GI: Soft. Bowel sounds are normal. He exhibits no distension and no mass.  Neurological: He is alert and oriented to person, place, and time.  Motor strength is 5/5 on the right deltoid bicep tricep grip hip flexion extensor ankle dorsiflexor 4+/5 in the left deltoid bicep tricep grip hip flexion extensor ankle dorsiflexor Cerebellar no evidence of dysmetria finger-nose-finger testing or heel shin testing bilaterally Sensation intact light touch bilateral upper and lower limbs Tone is normal No evidence of visual field cut  Skin: Skin is warm and dry.  Psychiatric: Mood normal.      Lab Results Last 24 Hours       Results for orders placed or performed during the hospital encounter of 10/08/19 (from the past 24 hour(s))  Glucose, capillary     Status: Abnormal    Collection Time: 10/11/19 11:59 AM  Result Value Ref Range    Glucose-Capillary 151 (H) 70 - 99 mg/dL  Glucose, capillary     Status: Abnormal    Collection Time: 10/11/19  4:00 PM  Result Value Ref Range    Glucose-Capillary 170 (H) 70 - 99 mg/dL  Glucose, capillary     Status: Abnormal    Collection Time: 10/11/19  9:13 PM  Result Value Ref Range    Glucose-Capillary 166 (H) 70 - 99 mg/dL    Comment 1 Notify RN      Comment 2 Document in Chart    Glucose, capillary     Status: Abnormal    Collection Time: 10/12/19  5:48 AM  Result Value Ref Range    Glucose-Capillary 145 (H) 70 - 99 mg/dL    Comment 1 Notify RN      Comment 2 Document in Chart    Ferritin     Status: None    Collection Time: 10/12/19  8:05 AM  Result Value Ref Range    Ferritin 35 24 - 336 ng/mL  Iron and TIBC     Status: Abnormal    Collection Time: 10/12/19  8:05 AM  Result Value Ref Range    Iron 46 45 - 182 ug/dL    TIBC 343 250 - 450 ug/dL    Saturation Ratios  13 (L) 17.9 - 39.5 %    UIBC 297 ug/dL       Imaging Results (Last 48 hours)  MR PELVIS WO CONTRAST   Result Date: 10/10/2019 CLINICAL DATA:  Known rectal mass. EXAM: MRI PELVIS WITHOUT CONTRAST TECHNIQUE: Multiplanar multisequence MR imaging of the pelvis was performed. No intravenous contrast was administered. Small amount of [place] gel was administered per rectum to optimize tumor evaluation. COMPARISON:  CT chest and abdomen performed on 10/10/2019 FINDINGS: TUMOR LOCATION Tumor distance from Anal Verge/Skin Surface:  9 cm Tumor distance to Internal Anal Sphincter: 4 cm TUMOR DESCRIPTION Circumferential Extent: Completely circumferential Tumor Length: 8.3 cm T - CATEGORY Extension through Muscularis Propria: Yes. Nodular extension beyond the colonic lumen approximately 7 mm. Shortest Distance of any tumor/node from Mesorectal Fascia: 0 mm tumor extending to the high mesorectum by way of extramural venous invasion and tumor deposits along the LEFTa mesorectum, high mesorectum posteriorly (image 6 of 32 and image 20 of 18 Extramural Vascular Invasion/Tumor Thrombus: Yes Invasion of Anterior Peritoneal Reflection: Yes nodular extension of tumor into the anterior peritoneal reflection best seen on image 14 of series 17. Involvement of Adjacent Organs or Pelvic Sidewall: No Levator Ani Involvement: No N - CATEGORY Mesorectal Lymph Nodes >=89mm: Greater than 4 abnormal lymph nodes within the mesorectum at least 7. Extra-mesorectal Lymphadenopathy: Yes, CT abdomen and pelvis CT with lymph nodes in the retroperitoneum. Other: Urinary bladder is unremarkable. Heterogeneity of the prostate. Small bilateral fat containing inguinal hernias. No acute bone finding to the extent the pelvis is visualized. No suspicious bone lesion. Contrast not administered for rectal cancer staging IMPRESSION: Rectal adenocarcinoma T stage: T4 a Rectal adenocarcinoma N stage: N2, also with retroperitoneal, metastatic non regional lymph  nodes. Distance from tumor to the internal anal sphincter is approximately 4 cm cm. Electronically Signed   By: Zetta Bills M.D.   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 Does the need for close, 24 hr/day medical supervision in concert with the patient's rehab needs make it unreasonable for this patient to be served in a less intensive setting? Yes 2. Co-Morbidities requiring supervision/potential complications: Rectal adenocarcinoma stage III or IV work-up pending, history of atrial fibrillation, history type 2 diabetes with neuropathy 3. Due to bladder management, bowel management, safety, skin/wound care, disease management, medication administration, pain management and patient education, does the patient require 24 hr/day rehab nursing? Potentially 4. Does the patient require coordinated care of a physician, rehab nurse, therapy disciplines of PT, OT to address physical and functional deficits in the context of the above medical diagnosis(es)? Potentially Addressing deficits in the following areas: balance, endurance, locomotion, strength, transferring, bathing, dressing, toileting and psychosocial support 5. Can the patient actively participate in an intensive therapy program of at least 3 hrs of therapy per day at least 5 days per week? Yes 6. The potential for patient to make measurable gains while on inpatient rehab is excellent 7. Anticipated functional outcomes upon discharge from inpatient rehab are modified independent  with PT, modified independent with OT, modified independent with SLP. 8. Estimated rehab length of stay to reach the above functional goals is: 7d 9. Anticipated discharge destination: Home 10. Overall Rehab/Functional Prognosis: Excellent in the near term   RECOMMENDATIONS: This patient's condition is appropriate for continued rehabilitative care in the following setting: CIR if  patient remains at a min assist level after oncology work-up completed Patient has agreed to participate in recommended program. Yes Note that insurance prior authorization may be required for reimbursement for recommended care.   Comment: Discussed that if chemotherapy is a treatment, this would not be  performed while patient is at rehab.  Daughter at [place], PA-C 10/12/2019    "I have personally performed a face to face diagnostic evaluation of this patient.  Additionally, I have reviewed and concur with the physician assistant's documentation above." Charlett Blake M.D. [facility] FAAPM&R (Neuromuscular Med) Diplomate Am Board of Electrodiagnostic Med Fellow Am Board of Interventional Pain            Revision History                     Routing History           Note Details  Author Charlett Blake, MD File Time 10/12/2019 12:34 PM  Author Type Physician Status Signed  Last Editor Charlett Blake, MD Service Physical Medicine and Rehabilitation

## 2019-10-15 NOTE — Progress Notes (Signed)
Patient to rehab per wheelchair accompanied by RN. Oriented to unit set up. Fall precaution discussed with patient.

## 2019-10-15 NOTE — TOC Transition Note (Signed)
Transition of Care [facility]) - CM/SW Discharge Note   Patient Details  Name: John Douglas MRN: [id number] Date of Birth: [birth year]  [facility] CM/SW Contact:  Pollie Friar, RN Phone Number: 10/15/2019, 1:20 PM   Clinical Narrative:    Pt discharging to CIR today. CM signing off.   Final next level of care: IP Rehab Facility Barriers to Discharge: No Barriers Identified   Patient Goals and CMS Choice        Discharge Placement                       Discharge Plan and Services                                     Social Determinants of Health (SDOH) Interventions     Readmission Risk Interventions No flowsheet data found.

## 2019-10-15 NOTE — Progress Notes (Signed)
Ok to give prn pre-colonoscopy Klonipin, pt sleep upon assessment.

## 2019-10-15 NOTE — Progress Notes (Signed)
Inpatient Rehabilitation-Admissions Coordinator   I have received insurance approval and medical clearance from Dr. Sloan Leiter for admit to CIR today. Notified pt and his daughter of approval and bed offer and they have accepted. Reviewed insurance benefits letter and consent forms. All questions answered. RN and [facility] team updated on plan for today.   Please call if questions.   Raechel Ache, OTR/L  Rehab Admissions Coordinator  [phone] 10/15/2019 1:29 PM

## 2019-10-15 NOTE — Progress Notes (Signed)
Physical Therapy Treatment Patient Details Name: John Douglas MRN: [id number] DOB: 02-Jan-1956 Today's Date: 10/15/2019    History of Present Illness John Douglas is a 64 y.o. male with medical history significant of coronary artery disease status post stents, paroxysmal A. fib, hypertension, diabetes, hyperlipidemia, marijuana abuse, tobacco abuse, obesity, recent GI bleeding status post colonoscopy on 10/07/2019 presents to emergency department due to left leg weakness and difficulty walking. MRI positive for R PCA infarct.    PT Comments    Patient seen for mobility progression. Pt continues to present with L side weakness and incoordination requiring min-mod A for gait training using AD. Continue to recommend CIR for further skilled PT services to maximize independence and safety with mobility.     Follow Up Recommendations  CIR     Equipment Recommendations  Other (comment) (TBD)    Recommendations for Other Services Rehab consult     Precautions / Restrictions Precautions Precautions: Fall Restrictions Weight Bearing Restrictions: No    Mobility  Bed Mobility Overal bed mobility: Needs Assistance Bed Mobility: Supine to Sit;Sit to Supine     Supine to sit: Supervision Sit to supine: Supervision   General bed mobility comments: supervision for safety.   Transfers Overall transfer level: Needs assistance Equipment used: Rolling walker (2 wheeled);1 person hand held assist Transfers: Sit to/from Stand Sit to Stand: Min assist         General transfer comment: assistance required for balance upon standing; at times needs assist to power up   Ambulation/Gait Ambulation/Gait assistance: Mod assist;Min assist Gait Distance (Feet):  (90 ft with RW and 90 ft with SPC) Assistive device: Rolling walker (2 wheeled);Straight cane Gait Pattern/deviations: Decreased step length - left;Wide base of support;Step-through pattern;Decreased dorsiflexion - left;Decreased  stride length Gait velocity: Decreased   General Gait Details: Cues for step height and heel strike on the L; cues for sequencing with SPC initially; assistance required for balance and weight shifing   Stairs             Wheelchair Mobility    Modified Rankin (Stroke Patients Only) Modified Rankin (Stroke Patients Only) Pre-Morbid Rankin Score: No symptoms Modified Rankin: Moderately severe disability     Balance Overall balance assessment: Needs assistance Sitting-balance support: Feet supported Sitting balance-Leahy Scale: Good     Standing balance support: During functional activity;Single extremity supported;Bilateral upper extremity supported Standing balance-Leahy Scale: Poor                              Cognition Arousal/Alertness: Awake/alert Behavior During Therapy: WFL for tasks assessed/performed Overall Cognitive Status: Impaired/Different from baseline Area of Impairment: Attention;Memory;Following commands;Safety/judgement;Awareness                   Current Attention Level: Selective Memory: Decreased short-term memory Following Commands: Follows one step commands consistently;Follows multi-step commands inconsistently Safety/Judgement: Decreased awareness of deficits Awareness: Emergent Problem Solving: Difficulty sequencing;Slow processing;Requires verbal cues        Exercises Other Exercises Other Exercises: sit to stands X 5 with emphasis on use of L side    General Comments        Pertinent Vitals/Pain Pain Assessment: No/denies pain    Home Living                      Prior Function            PT Goals (current goals can now be found  in the care plan section) Acute Rehab PT Goals Patient Stated Goal: to return to independent Progress towards PT goals: Progressing toward goals    Frequency    Min 4X/week      PT Plan Current plan remains appropriate    Co-evaluation               AM-PAC PT "6 Clicks" Mobility   Outcome Measure  Help needed turning from your back to your side while in a flat bed without using bedrails?: None Help needed moving from lying on your back to sitting on the side of a flat bed without using bedrails?: None Help needed moving to and from a bed to a chair (including a wheelchair)?: A Little Help needed standing up from a chair using your arms (e.g., wheelchair or bedside chair)?: A Little Help needed to walk in hospital room?: A Lot Help needed climbing 3-5 steps with a railing? : Total 6 Click Score: 17    End of Session Equipment Utilized During Treatment: Gait belt Activity Tolerance: Patient tolerated treatment well Patient left: with family/visitor present;with call bell/phone within reach;Other (comment) (pt in bathroom with daughter present ) Nurse Communication: Mobility status PT Visit Diagnosis: Unsteadiness on feet (R26.81);Other abnormalities of gait and mobility (R26.89);Hemiplegia and hemiparesis;Difficulty in walking, not elsewhere classified (R26.2) Hemiplegia - Right/Left: Left Hemiplegia - dominant/non-dominant: Non-dominant Hemiplegia - caused by: Cerebral infarction     Time: 8185-6314 PT Time Calculation (min) (ACUTE ONLY): 26 min  Charges:  $Gait Training: 8-22 mins $Neuromuscular Re-education: 8-22 mins                     Earney Navy, PTA Acute Rehabilitation Services Pager: [phone] Office: [phone]     Darliss Cheney 10/15/2019, 4:39 PM

## 2019-10-15 NOTE — Plan of Care (Signed)
  Problem: Education: Goal: Knowledge of General Education information will improve Description: Including pain rating scale, medication(s)/side effects and non-pharmacologic comfort measures Outcome: Progressing   Problem: Health Behavior/Discharge Planning: Goal: Ability to manage health-related needs will improve Outcome: Progressing   Problem: Clinical Measurements: Goal: Ability to maintain clinical measurements within normal limits will improve Outcome: Progressing Goal: Diagnostic test results will improve Outcome: Progressing   Problem: Activity: Goal: Risk for activity intolerance will decrease Outcome: Progressing   Problem: Nutrition: Goal: Adequate nutrition will be maintained Outcome: Progressing   Problem: Coping: Goal: Level of anxiety will decrease Outcome: Progressing   Problem: Safety: Goal: Ability to remain free from injury will improve Outcome: Progressing   Problem: Education: Goal: Knowledge of disease or condition will improve Outcome: Progressing Goal: Knowledge of secondary prevention will improve Outcome: Progressing Goal: Knowledge of patient specific risk factors addressed and post discharge goals established will improve Outcome: Progressing Goal: Individualized Educational Video(s) Outcome: Progressing   Problem: Coping: Goal: Will verbalize positive feelings about self Outcome: Progressing   Problem: Health Behavior/Discharge Planning: Goal: Ability to manage health-related needs will improve Outcome: Progressing   Problem: Self-Care: Goal: Ability to participate in self-care as condition permits will improve Outcome: Progressing   Problem: Nutrition: Goal: Dietary intake will improve Outcome: Progressing   Problem: Ischemic Stroke/TIA Tissue Perfusion: Goal: Complications of ischemic stroke/TIA will be minimized Outcome: Progressing

## 2019-10-15 NOTE — Discharge Summary (Signed)
Physician Discharge Summary  John Douglas LHT:[id number] DOB: 10-13-1955 DOA: 10/08/2019  PCP: Leonides Sake, MD  Admit date: 10/08/2019 Discharge date: 10/15/2019  Admitted From: home  Disposition:  CIR  Recommendations for Outpatient Follow-up:  1. Follow up with PCP in 1-2 weeks after discharge  2. Follow up closely for rectal bleeding 3. Schedule neurology follow-up after discharge. 4. Schedule follow-up with oncologist on discharge.   Discharge Condition: Stable CODE STATUS: Full code Diet recommendation: Low-salt low-carb diet  Discharge summary: 64 year old gentleman with extensive cardiovascular problems.  - STEMI and PCI to RCA in [id number]/2620 complicated by sustained ventricular tachycardia .  Aspirin and Brilinta - Non-STEMI and subsequent PCI to LAD on 06/2019 along with paroxysmal A. fib.  Aspirin and Plavix. Waited 3 months for colonoscopy because of drug-eluting stent and NSTEMI. Patient was having intermittent hematochezia for more than 6 months before even he had ST elevation MI.   6/9, Plavix held for 1 week and continued on aspirin, underwent colonoscopy, found to have adenocarcinoma of the colon with partially obstructive lesion. Since the day of colonoscopy or the night before only he was having some weakness, dragging his left foot, dizzy and disoriented.  He reported the symptoms while he was taking bowel prep that was thought to be from bowel prep.  6/10, went back to [facility] ER.  Found to be in A. fib with RVR heart rate 150.  CT head showed right PCA distribution infarct and transferred to [facility].  # Right PCA territory ischemic infarct with petechial hemorrhagic conversion: With history of paroxysmal A. Fib Clinical findings, dizziness, lightheadedness and confusion, left leg weakness. CT head findings, right PCA territory ischemic infarct. MRI of the brain, acute infarct right PCA territory, posterior limb of internal capsule.  Petechial blood within the  posterior medial right temporal lobe.  No mass-effect. CTA of the head and neck, no large vessel occlusion. 2D echocardiogram,normal. Echocardiogram 06/2019 with ejection fraction 45%. Antiplatelet therapy, aspirin at home continued. Plavix resumed 6/16 LDL 98.  Last LDL 32 on 4/30.  On rosuvastatin and Zetia.  Continue. Hemoglobin A1c 8.  Not on treatment.  Was on Metformin in the past so he must be diagnosed with diabetes. Continue to work with PT OT.  Appreciate neurology input.  Stable to transfer to CIR. 6/16, there is no plan for Port-A-Cath placement at least before July 4, will resume Plavix.  # Paroxysmal atrial fibrillation:  On sinus rhythm.  On metoprolol.  Eliquis discontinued due to rectal bleeding.  Resuming aspirin and Plavix.  # Coronary artery disease, ST elevation MI 03/2019, non-STEMI 05/2018 with VT:  Currently stable.  Without chest pain. Patient metoprolol and lisinopril.  On aspirin.  Resume Plavix and monitor for rectal bleeding.  # Hematochezia/recently diagnosed adenocarcinoma of the colon:  New diagnosed metastatic adenocarcinoma.  Followed by oncology.   Staging MR and Ct chest / abdomen completed.  Followed by Dr Marin Olp  Further plan as per oncology. Patient had trace of blood on his bowel movement 6/16, will continue to monitor.  Stable to transfer to CIR level of care today .     Discharge Diagnoses:  Principal Problem:   Ischemic stroke [facility]) Active Problems:   Paroxysmal atrial fibrillation (HCC)   Essential hypertension   Hyperlipidemia LDL goal <70   Coronary artery disease   Ischemic cardiomyopathy   Leukocytosis   Hypokalemia   Colon cancer [facility])    Discharge Instructions  Discharge Instructions    Diet - low  sodium heart healthy   Complete by: As directed    Diet Carb Modified   Complete by: As directed    Increase activity slowly   Complete by: As directed      Allergies as of 10/15/2019      Reactions   Ibuprofen Other  (See Comments)   Was told to not take this    Metformin And Related    Bloody stools    Naproxen Other (See Comments)   Was told to not take this    Penicillins Hives   Did it involve swelling of the face/tongue/throat, SOB, or low BP? Unk Did it involve sudden or severe rash/hives, skin peeling, or any reaction on the inside of your mouth or nose? Yes Did you need to seek medical attention at a hospital or doctor's office? Unk When did it last happen?"Childhood- 53 years ago" If all above answers are "NO", may proceed with cephalosporin use.   Yellow Jacket Venom [bee Venom] Swelling, Other (See Comments)   Severe swelling where stung      Medication List    TAKE these medications   aspirin 81 MG chewable tablet Chew 1 tablet (81 mg total) by mouth daily.   clonazePAM 1 MG disintegrating tablet Commonly known as: KLONOPIN Take 1 tablet (1 mg total) by mouth 2 (two) times daily as needed (pre-colonoscopy anxiety).   clopidogrel 75 MG tablet Commonly known as: PLAVIX Take 1 tablet (75 mg total) by mouth daily.   ezetimibe 10 MG tablet Commonly known as: ZETIA Take 10 mg by mouth at bedtime.   fluticasone 50 MCG/ACT nasal spray Commonly known as: FLONASE Place 1-2 sprays into both nostrils daily as needed for allergies or rhinitis (or seasonal allergies).   insulin aspart 100 UNIT/ML injection Commonly known as: novoLOG Inject 0-5 Units into the skin at bedtime.   insulin aspart 100 UNIT/ML injection Commonly known as: novoLOG Inject 0-9 Units into the skin 3 (three) times daily with meals.   lisinopril 2.5 MG tablet Commonly known as: ZESTRIL Take 2.5 mg by mouth daily.   metoprolol tartrate 50 MG tablet Commonly known as: LOPRESSOR Take 1.5 tablets (75 mg total) by mouth 2 (two) times daily.   nitroGLYCERIN 0.4 MG SL tablet Commonly known as: NITROSTAT Place 0.4 mg under the tongue every 5 (five) minutes as needed for chest pain.   rosuvastatin 20 MG  tablet Commonly known as: CRESTOR Take 1 tablet (20 mg total) by mouth daily.       Allergies  Allergen Reactions  . Ibuprofen Other (See Comments)    Was told to not take this   . Metformin And Related     Bloody stools   . Naproxen Other (See Comments)    Was told to not take this   . Penicillins Hives    Did it involve swelling of the face/tongue/throat, SOB, or low BP? Unk Did it involve sudden or severe rash/hives, skin peeling, or any reaction on the inside of your mouth or nose? Yes Did you need to seek medical attention at a hospital or doctor's office? Unk When did it last happen?"Childhood- 53 years ago" If all above answers are "NO", may proceed with cephalosporin use.   . Yellow Jacket Venom [Bee Venom] Swelling and Other (See Comments)    Severe swelling where stung    Consultations:  Neurology  Oncology  surgery   Procedures/Studies: CT ANGIO HEAD W OR WO CONTRAST  Result Date: 10/09/2019 CLINICAL DATA:  Stroke  follow-up EXAM: CT ANGIOGRAPHY HEAD AND NECK TECHNIQUE: Multidetector CT imaging of the head and neck was performed using the standard protocol during bolus administration of intravenous contrast. Multiplanar CT image reconstructions and MIPs were obtained to evaluate the vascular anatomy. Carotid stenosis measurements (when applicable) are obtained utilizing NASCET criteria, using the distal internal carotid diameter as the denominator. CONTRAST:  121mL OMNIPAQUE IOHEXOL 350 MG/ML SOLN COMPARISON:  None. FINDINGS: CTA NECK FINDINGS SKELETON: There is no bony spinal canal stenosis. No lytic or blastic lesion. OTHER NECK: Normal pharynx, larynx and major salivary glands. No cervical lymphadenopathy. Unremarkable thyroid gland. UPPER CHEST: No pneumothorax or pleural effusion. No nodules or masses. AORTIC ARCH: There is no calcific atherosclerosis of the aortic arch. There is no aneurysm, dissection or hemodynamically significant stenosis of the visualized  portion of the aorta. Conventional 3 vessel aortic branching pattern. The visualized proximal subclavian arteries are widely patent. RIGHT CAROTID SYSTEM: Normal without aneurysm, dissection or stenosis. LEFT CAROTID SYSTEM: Normal without aneurysm, dissection or stenosis. VERTEBRAL ARTERIES: Left dominant configuration. Both origins are clearly patent. There is no dissection, occlusion or flow-limiting stenosis to the skull base (V1-V3 segments). CTA HEAD FINDINGS POSTERIOR CIRCULATION: --Vertebral arteries: Normal V4 segments. --Inferior cerebellar arteries: Normal. --Basilar artery: Normal. --Superior cerebellar arteries: Normal. --Posterior cerebral arteries (PCA): Normal. ANTERIOR CIRCULATION: --Intracranial internal carotid arteries: Normal. --Anterior cerebral arteries (ACA): Normal. Both A1 segments are present. Patent anterior communicating artery (a-comm). --Middle cerebral arteries (MCA): Normal. VENOUS SINUSES: As permitted by contrast timing, patent. ANATOMIC VARIANTS: None Other: There is hypoattenuation within the right PCA territory as demonstrated on earlier head CT and confirmed with MRI. No progression of suspected petechial hemorrhage. Review of the MIP images confirms the above findings. IMPRESSION: 1. Normal intracranial and cervical arteries. The right PCA is patent. 2. Unchanged appearance of right PCA territory ischemia with subacute to chronic blood products. Electronically Signed   By: Ulyses Jarred M.D.   On: 10/09/2019 02:43   CT HEAD WO CONTRAST  Result Date: 10/08/2019 CLINICAL DATA:  Dizziness. EXAM: CT HEAD WITHOUT CONTRAST TECHNIQUE: Contiguous axial images were obtained from the base of the skull through the vertex without intravenous contrast. COMPARISON:  None. FINDINGS: Brain: There is abnormal mixed attenuation in the right PCA territory worrisome for infarct with associated petechial hemorrhage. The brain otherwise appears normal. No midline shift or hydrocephalus.  Vascular: Atherosclerosis.  No hyperdense vessel is identified. Skull: Intact.  No focal lesion. Sinuses/Orbits: Negative Other: . none. 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 Brain MRI with and without contrast is recommended for confirmation. Electronically Signed   By: Inge Rise M.D.   On: 10/08/2019 15:05   CT ANGIO NECK W OR WO CONTRAST  Result Date: 10/09/2019 CLINICAL DATA:  Stroke follow-up EXAM: CT ANGIOGRAPHY HEAD AND NECK TECHNIQUE: Multidetector CT imaging of the head and neck was performed using the standard protocol during bolus administration of intravenous contrast. Multiplanar CT image reconstructions and MIPs were obtained to evaluate the vascular anatomy. Carotid stenosis measurements (when applicable) are obtained utilizing NASCET criteria, using the distal internal carotid diameter as the denominator. CONTRAST:  138mL OMNIPAQUE IOHEXOL 350 MG/ML SOLN COMPARISON:  None. FINDINGS: CTA NECK FINDINGS SKELETON: There is no bony spinal canal stenosis. No lytic or blastic lesion. OTHER NECK: Normal pharynx, larynx and major salivary glands. No cervical lymphadenopathy. Unremarkable thyroid gland. UPPER CHEST: No pneumothorax or pleural effusion. No nodules or masses. AORTIC ARCH: There is no calcific  atherosclerosis of the aortic arch. There is no aneurysm, dissection or hemodynamically significant stenosis of the visualized portion of the aorta. Conventional 3 vessel aortic branching pattern. The visualized proximal subclavian arteries are widely patent. RIGHT CAROTID SYSTEM: Normal without aneurysm, dissection or stenosis. LEFT CAROTID SYSTEM: Normal without aneurysm, dissection or stenosis. VERTEBRAL ARTERIES: Left dominant configuration. Both origins are clearly patent. There is no dissection, occlusion or flow-limiting stenosis to the skull base (V1-V3 segments). CTA HEAD FINDINGS POSTERIOR CIRCULATION: --Vertebral  arteries: Normal V4 segments. --Inferior cerebellar arteries: Normal. --Basilar artery: Normal. --Superior cerebellar arteries: Normal. --Posterior cerebral arteries (PCA): Normal. ANTERIOR CIRCULATION: --Intracranial internal carotid arteries: Normal. --Anterior cerebral arteries (ACA): Normal. Both A1 segments are present. Patent anterior communicating artery (a-comm). --Middle cerebral arteries (MCA): Normal. VENOUS SINUSES: As permitted by contrast timing, patent. ANATOMIC VARIANTS: None Other: There is hypoattenuation within the right PCA territory as demonstrated on earlier head CT and confirmed with MRI. No progression of suspected petechial hemorrhage. Review of the MIP images confirms the above findings. IMPRESSION: 1. Normal intracranial and cervical arteries. The right PCA is patent. 2. Unchanged appearance of right PCA territory ischemia with subacute to chronic blood products. Electronically Signed   By: Ulyses Jarred M.D.   On: 10/09/2019 02:43   CT CHEST W CONTRAST  Result Date: 10/10/2019 CLINICAL DATA:  Rectal cancer staging. EXAM: CT CHEST AND ABDOMEN WITH CONTRAST TECHNIQUE: Multidetector CT imaging of the chest and abdomen was performed following the standard protocol during bolus administration of intravenous contrast. CONTRAST:  179mL OMNIPAQUE IOHEXOL 300 MG/ML  SOLN COMPARISON:  None FINDINGS: CT CHEST FINDINGS Cardiovascular: Calcified coronary artery disease. Calcified and noncalcified plaque in the nonaneurysmal thoracic aorta. Central pulmonary vasculature is unremarkable. Heart size is normal without pericardial effusion. Mediastinum/Nodes: Mild circumferential distal esophageal thickening. No thoracic inlet adenopathy. No axillary adenopathy. No mediastinal adenopathy. No hilar adenopathy. Lungs/Pleura: No consolidation. No pleural effusion. Airways are patent. Musculoskeletal: No chest wall mass. See below for full musculoskeletal details. CT ABDOMEN FINDINGS Hepatobiliary:  Hyperenhancing focus in the LEFT hepatic lobe with surrounding changes in parenchymal attenuation on venous phase. No imaging of this area on delayed phase. No pericholecystic stranding or biliary ductal dilation. Pancreas: Pancreas normal without focal lesion. Spleen: Spleen normal size without focal lesion. Adrenals/Urinary Tract: Adrenal glands are normal. No hydronephrosis. Symmetric renal enhancement. Stomach/Bowel: Bowel without acute process to the extent evaluated. Visualized portions of the appendix are normal. Rectal mass not visualized. Vascular/Lymphatic: Calcific and noncalcific atheromatous plaque in the abdominal aorta. No aneurysm. Retroperitoneal and upper pelvic lymphadenopathy. (Image 96, series 3) 14 mm short axis LEFT common iliac lymph node. Intra-aortocaval lymph node (image 75, series 3) 14 mm short axis. Rounded lymph nodes throughout the LEFT and RIGHT periaortic chain next largest lymph node on image 76 of series 3 measuring 13 mm. RIGHT retrocrural lymph node with enlargement 12 mm. Other: No ascites.  No peritoneal nodularity. Musculoskeletal: Spinal degenerative changes. No acute or destructive bone process. IMPRESSION: 1. Retroperitoneal and upper pelvic lymphadenopathy, compatible with metastatic disease in a patient with known rectal cancer 2. Hyperenhancing focus in the LEFT hepatic lobe with surrounding changes in parenchymal attenuation on venous phase. No imaging of this area on delayed phase. This may represent a flash filling hemangioma and appears atypical for what would be expected for rectal cancer metastasis. However, would suggest abdominal MRI for definitive assessment. 3. Mild circumferential distal esophageal thickening, nonspecific. Correlate with any symptoms of esophagitis. 4. Calcified coronary artery disease. 5. Aortic  atherosclerosis. Aortic Atherosclerosis (ICD10-I70.0). Electronically Signed   By: Zetta Bills M.D.   On: 10/10/2019 12:04   CT ABDOMEN W  CONTRAST  Result Date: 10/10/2019 CLINICAL DATA:  Rectal cancer staging. EXAM: CT CHEST AND ABDOMEN WITH CONTRAST TECHNIQUE: Multidetector CT imaging of the chest and abdomen was performed following the standard protocol during bolus administration of intravenous contrast. CONTRAST:  123mL OMNIPAQUE IOHEXOL 300 MG/ML  SOLN COMPARISON:  None FINDINGS: CT CHEST FINDINGS Cardiovascular: Calcified coronary artery disease. Calcified and noncalcified plaque in the nonaneurysmal thoracic aorta. Central pulmonary vasculature is unremarkable. Heart size is normal without pericardial effusion. Mediastinum/Nodes: Mild circumferential distal esophageal thickening. No thoracic inlet adenopathy. No axillary adenopathy. No mediastinal adenopathy. No hilar adenopathy. Lungs/Pleura: No consolidation. No pleural effusion. Airways are patent. Musculoskeletal: No chest wall mass. See below for full musculoskeletal details. CT ABDOMEN FINDINGS Hepatobiliary: Hyperenhancing focus in the LEFT hepatic lobe with surrounding changes in parenchymal attenuation on venous phase. No imaging of this area on delayed phase. No pericholecystic stranding or biliary ductal dilation. Pancreas: Pancreas normal without focal lesion. Spleen: Spleen normal size without focal lesion. Adrenals/Urinary Tract: Adrenal glands are normal. No hydronephrosis. Symmetric renal enhancement. Stomach/Bowel: Bowel without acute process to the extent evaluated. Visualized portions of the appendix are normal. Rectal mass not visualized. Vascular/Lymphatic: Calcific and noncalcific atheromatous plaque in the abdominal aorta. No aneurysm. Retroperitoneal and upper pelvic lymphadenopathy. (Image 96, series 3) 14 mm short axis LEFT common iliac lymph node. Intra-aortocaval lymph node (image 75, series 3) 14 mm short axis. Rounded lymph nodes throughout the LEFT and RIGHT periaortic chain next largest lymph node on image 76 of series 3 measuring 13 mm. RIGHT retrocrural  lymph node with enlargement 12 mm. Other: No ascites.  No peritoneal nodularity. Musculoskeletal: Spinal degenerative changes. No acute or destructive bone process. IMPRESSION: 1. Retroperitoneal and upper pelvic lymphadenopathy, compatible with metastatic disease in a patient with known rectal cancer 2. Hyperenhancing focus in the LEFT hepatic lobe with surrounding changes in parenchymal attenuation on venous phase. No imaging of this area on delayed phase. This may represent a flash filling hemangioma and appears atypical for what would be expected for rectal cancer metastasis. However, would suggest abdominal MRI for definitive assessment. 3. Mild circumferential distal esophageal thickening, nonspecific. Correlate with any symptoms of esophagitis. 4. Calcified coronary artery disease. 5. Aortic atherosclerosis. Aortic Atherosclerosis (ICD10-I70.0). Electronically Signed   By: Zetta Bills M.D.   On: 10/10/2019 12:04   MR BRAIN WO CONTRAST  Result Date: 10/08/2019 CLINICAL DATA:  Left leg weakness with difficulty walking. Dizziness. EXAM: MRI HEAD WITHOUT CONTRAST TECHNIQUE: Multiplanar, multiecho pulse sequences of the brain and surrounding structures were obtained without intravenous contrast. COMPARISON:  Head CT 10/08/2019 FINDINGS: Brain: There is abnormal diffusion restriction within the medial right temporal lobe and at the dorsolateral right thalamus and posterior globus pallidus, adjacent to the posterior limb of the internal capsule. There is intraparenchymal blood within the posteromedial right temporal lobe. No midline shift or other mass effect. Normal white matter signal. Normal volume of CSF spaces. No chronic microhemorrhage. Normal midline structures. Vascular: Normal flow voids. Skull and upper cervical spine: Normal marrow signal. Sinuses/Orbits: Negative. Other: None. IMPRESSION: 1. Acute infarct of the right PCA territory, which affects the posterior limb of the right internal capsule,  in keeping with reported left lower extremity weakness. 2. Petechial blood within the posteromedial right temporal lobe. No mass effect. Heidelberg classification 1b: HI2, confluent petechiae, no mass effect. Electronically Signed  By: Ulyses Jarred M.D.   On: 10/08/2019 20:58   MR PELVIS WO CONTRAST  Result Date: 10/10/2019 CLINICAL DATA:  Known rectal mass. EXAM: MRI PELVIS WITHOUT CONTRAST TECHNIQUE: Multiplanar multisequence MR imaging of the pelvis was performed. No intravenous contrast was administered. Small amount of [place] gel was administered per rectum to optimize tumor evaluation. COMPARISON:  CT chest and abdomen performed on 10/10/2019 FINDINGS: TUMOR LOCATION Tumor distance from Anal Verge/Skin Surface:  9 cm Tumor distance to Internal Anal Sphincter: 4 cm TUMOR DESCRIPTION Circumferential Extent: Completely circumferential Tumor Length: 8.3 cm T - CATEGORY Extension through Muscularis Propria: Yes. Nodular extension beyond the colonic lumen approximately 7 mm. Shortest Distance of any tumor/node from Mesorectal Fascia: 0 mm tumor extending to the high mesorectum by way of extramural venous invasion and tumor deposits along the LEFTa mesorectum, high mesorectum posteriorly (image 6 of 32 and image 20 of 18 Extramural Vascular Invasion/Tumor Thrombus: Yes Invasion of Anterior Peritoneal Reflection: Yes nodular extension of tumor into the anterior peritoneal reflection best seen on image 14 of series 17. Involvement of Adjacent Organs or Pelvic Sidewall: No Levator Ani Involvement: No N - CATEGORY Mesorectal Lymph Nodes >=18mm: Greater than 4 abnormal lymph nodes within the mesorectum at least 7. Extra-mesorectal Lymphadenopathy: Yes, CT abdomen and pelvis CT with lymph nodes in the retroperitoneum. Other: Urinary bladder is unremarkable. Heterogeneity of the prostate. Small bilateral fat containing inguinal hernias. No acute bone finding to the extent the pelvis is visualized. No suspicious bone  lesion. Contrast not administered for rectal cancer staging IMPRESSION: Rectal adenocarcinoma T stage: T4 a Rectal adenocarcinoma N stage: N2, also with retroperitoneal, metastatic non regional lymph nodes. Distance from tumor to the internal anal sphincter is approximately 4 cm cm. Electronically Signed   By: Zetta Bills M.D.   On: 10/10/2019 18:15   MR LIVER W WO CONTRAST  Result Date: 10/12/2019 CLINICAL DATA:  Rectal cancer.  Liver lesion on CT. EXAM: MRI ABDOMEN WITHOUT AND WITH CONTRAST TECHNIQUE: Multiplanar multisequence MR imaging of the abdomen was performed both before and after the administration of intravenous contrast. CONTRAST:  39mL GADAVIST GADOBUTROL 1 MMOL/ML IV SOLN COMPARISON:  CT abdomen dated 10/10/2019 FINDINGS: Motion degraded images. Lower chest: Lung bases are clear. Hepatobiliary: 16 x 10 mm T2 hyperintense lesion along the posterior aspect of segment 2 (series 5/image 13), with avid arterial enhancement (series 802/image 45), without washout. This appearance favors a flash filling hemangioma. No morphologic findings of cirrhosis. No hepatic steatosis. Gallbladder is unremarkable. No intrahepatic or extrahepatic ductal dilatation. Pancreas:  Within normal limits. Spleen:  Within normal limits. Adrenals/Urinary Tract:  Adrenal glands are within normal limits. Kidneys are within normal limits.  No hydronephrosis. Stomach/Bowel: Stomach is within normal limits. Visualized bowel is unremarkable. Vascular/Lymphatic:  No evidence of abdominal aortic aneurysm. Para-aortic lymph nodes measuring up to 10 mm short axis (series 801/image 96), better evaluated on recent CT. Other:  No abdominal ascites. Musculoskeletal: No focal osseous lesions. IMPRESSION: 16 mm lesion in the left hepatic lobe favors a flash filling hemangioma. Small para-aortic lymph nodes measuring up to 10 mm short axis, suspicious for nodal metastases, better evaluated on recent CT. Electronically Signed   By: Keontay Hy M.D.   On: 10/12/2019 14:54   DG Chest Portable 1 View  Result Date: 10/08/2019 CLINICAL DATA:  complaints of Afib RVR with HR of 150 bpm. Pt received 10 mg of cardizeim and 900 ml normal saline fluid prior to arrival. HR now at  64 bpm. C/O dizziness.afib EXAM: PORTABLE CHEST 1 VIEW COMPARISON:  None. FINDINGS: Normal mediastinum and cardiac silhouette. Normal pulmonary vasculature. No evidence of effusion, infiltrate, or pneumothorax. No acute bony abnormality. IMPRESSION: No acute cardiopulmonary process. Electronically Signed   By: Suzy Bouchard M.D.   On: 10/08/2019 12:23   ECHOCARDIOGRAM COMPLETE BUBBLE STUDY  Result Date: 10/09/2019    ECHOCARDIOGRAM REPORT   Patient Name:   LAKEEM ROZO Date of Exam: 10/09/2019 Medical Rec #:  [id number]     Height:       70.0 in Accession #:    [id number]    Weight:       220.0 lb Date of Birth:  [birth year]    BSA:          2.174 m Patient Age:    48 years      BP:           162/99 mmHg Patient Gender: M             HR:           74 bpm. Exam Location:  Inpatient Procedure: 2D Echo, Cardiac Doppler, Color Doppler and Saline Contrast Bubble            Study Indications:    Stroke 434.91 / I63.9  History:        Patient has prior history of Echocardiogram examinations, most                 recent 06/04/2019. Cardiomyopathy, Stroke, Arrythmias:Atrial                 Fibrillation and non-specific ST changes; Risk                 Factors:Hypertension, Dyslipidemia and Non-Smoker.  Sonographer:    Vickie Epley RDCS Referring Phys: [id number] [facility]  Sonographer Comments: Image acquisition challenging due to respiratory motion. IMPRESSIONS  1. Left ventricular ejection fraction, by estimation, is 60 to 65%. The left ventricle has normal function. The left ventricle has no regional wall motion abnormalities. Left ventricular diastolic parameters are consistent with Grade I diastolic dysfunction (impaired relaxation).  2. Right ventricular systolic function is  normal. The right ventricular size is normal.  3. Left atrial size was mildly dilated.  4. The mitral valve is normal in structure. No evidence of mitral valve regurgitation. No evidence of mitral stenosis.  5. The aortic valve is tricuspid. Aortic valve regurgitation is not visualized. Mild to moderate aortic valve sclerosis/calcification is present, without any evidence of aortic stenosis.  6. Aortic dilatation noted. There is mild dilatation at the level of the sinuses of Valsalva measuring 38 mm.  7. The inferior vena cava is normal in size with greater than 50% respiratory variability, suggesting right atrial pressure of 3 mmHg.  8. Agitated saline contrast bubble study was negative, with no evidence of any interatrial shunt. Conclusion(s)/Recommendation(s): No intracardiac source of embolism detected on this transthoracic study. A transesophageal echocardiogram is recommended to exclude cardiac source of embolism if clinically indicated. FINDINGS  Left Ventricle: Left ventricular ejection fraction, by estimation, is 60 to 65%. The left ventricle has normal function. The left ventricle has no regional wall motion abnormalities. The left ventricular internal cavity size was normal in size. There is  no left ventricular hypertrophy. Left ventricular diastolic parameters are consistent with Grade I diastolic dysfunction (impaired relaxation). Right Ventricle: The right ventricular size is normal. No increase in right ventricular wall thickness. Right ventricular systolic function is  normal. Left Atrium: Left atrial size was mildly dilated. Right Atrium: Right atrial size was normal in size. Pericardium: There is no evidence of pericardial effusion. Mitral Valve: The mitral valve is normal in structure. Normal mobility of the mitral valve leaflets. No evidence of mitral valve regurgitation. No evidence of mitral valve stenosis. Tricuspid Valve: The tricuspid valve is normal in structure. Tricuspid valve  regurgitation is not demonstrated. No evidence of tricuspid stenosis. Aortic Valve: The aortic valve is tricuspid. . There is moderate thickening and moderate calcification of the aortic valve. Aortic valve regurgitation is not visualized. Mild to moderate aortic valve sclerosis/calcification is present, without any evidence of aortic stenosis. There is moderate thickening of the aortic valve. There is moderate calcification of the aortic valve. Pulmonic Valve: The pulmonic valve was normal in structure. Pulmonic valve regurgitation is not visualized. No evidence of pulmonic stenosis. Aorta: Aortic dilatation noted. There is mild dilatation at the level of the sinuses of Valsalva measuring 38 mm. Venous: The inferior vena cava is normal in size with greater than 50% respiratory variability, suggesting right atrial pressure of 3 mmHg. IAS/Shunts: No atrial level shunt detected by color flow Doppler. Agitated saline contrast was given intravenously to evaluate for intracardiac shunting. Agitated saline contrast bubble study was negative, with no evidence of any interatrial shunt. There  is no evidence of a patent foramen ovale. There is no evidence of an atrial septal defect.  LEFT VENTRICLE PLAX 2D LVIDd:         5.10 cm      Diastology LVIDs:         4.00 cm      LV e' lateral:   10.70 cm/s LV PW:         0.80 cm      LV E/e' lateral: 9.3 LV IVS:        0.80 cm      LV e' medial:    5.76 cm/s LVOT diam:     2.20 cm      LV E/e' medial:  17.3 LV SV:         76 LV SV Index:   35 LVOT Area:     3.80 cm  LV Volumes (MOD) LV vol d, MOD A2C: 124.0 ml LV vol d, MOD A4C: 125.0 ml LV vol s, MOD A2C: 64.9 ml LV vol s, MOD A4C: 58.3 ml LV SV MOD A2C:     59.1 ml LV SV MOD A4C:     125.0 ml LV SV MOD BP:      63.1 ml RIGHT VENTRICLE             IVC RV S prime:     11.80 cm/s  IVC diam: 1.10 cm TAPSE (M-mode): 2.3 cm LEFT ATRIUM             Index       RIGHT ATRIUM           Index LA diam:        4.50 cm 2.07 cm/m  RA Area:      14.60 cm LA Vol (A2C):   38.3 ml 17.62 ml/m RA Volume:   35.40 ml  16.29 ml/m LA Vol (A4C):   29.9 ml 13.76 ml/m LA Biplane Vol: 33.9 ml 15.60 ml/m  AORTIC VALVE LVOT Vmax:   94.70 cm/s LVOT Vmean:  69.100 cm/s LVOT VTI:    0.199 m  AORTA Ao Root diam: 3.80 cm Ao Asc diam:  3.70 cm MITRAL  VALVE MV Area (PHT): 3.03 cm    SHUNTS MV Decel Time: 250 msec    Systemic VTI:  0.20 m MV E velocity: 99.40 cm/s  Systemic Diam: 2.20 cm MV A velocity: 36.40 cm/s MV E/A ratio:  2.73 Candee Furbish MD Electronically signed by Candee Furbish MD Signature Date/Time: 10/09/2019/1:07:12 PM    Final     (Echo, Carotid, EGD, Colonoscopy, ERCP)    Subjective: seen and examined.  No overnight events.  Eager to go to rehab.   Discharge Exam: Vitals:   10/15/19 0739 10/15/19 1147  BP: 139/62 (!) 149/85  Pulse: 75 63  Resp: 18 18  Temp: 98.9 F (37.2 C) 98.1 F (36.7 C)  SpO2: 99% 99%   Vitals:   10/15/19 0001 10/15/19 0311 10/15/19 0739 10/15/19 1147  BP: (!) 144/72 132/78 139/62 (!) 149/85  Pulse: (!) 58 (!) 58 75 63  Resp: 16 16 18 18   Temp: 98.8 F (37.1 C) 97.8 F (36.6 C) 98.9 F (37.2 C) 98.1 F (36.7 C)  TempSrc: Oral Oral Oral Oral  SpO2: 98% 98% 99% 99%  Weight:      Height:        General: Pt is alert, awake, not in acute distress Cardiovascular: RRR, S1/S2 +, no rubs, no gallops Respiratory: CTA bilaterally, no wheezing, no rhonchi Abdominal: Soft, NT, ND, bowel sounds + Extremities: no edema, no cyanosis Neuro exam: No cranial nerve deficits. Has minimal drift on the left lower extremity.   The results of significant diagnostics from this hospitalization (including imaging, microbiology, ancillary and laboratory) are listed below for reference.     Microbiology: Recent Results (from the past 240 hour(s))  SARS Coronavirus 2 by RT PCR (hospital order, performed in [facility] hospital lab) Nasopharyngeal Nasopharyngeal Swab     Status: None   Collection Time: 10/08/19  5:40 PM    Specimen: Nasopharyngeal Swab  Result Value Ref Range Status   SARS Coronavirus 2 NEGATIVE NEGATIVE Final    Comment: (NOTE) SARS-CoV-2 target nucleic acids are NOT DETECTED.  The SARS-CoV-2 RNA is generally detectable in upper and lower respiratory specimens during the acute phase of infection. The lowest concentration of SARS-CoV-2 viral copies this assay can detect is 250 copies / mL. A negative result does not preclude SARS-CoV-2 infection and should not be used as the sole basis for treatment or other patient management decisions.  A negative result may occur with improper specimen collection / handling, submission of specimen other than nasopharyngeal swab, presence of viral mutation(s) within the areas targeted by this assay, and inadequate number of viral copies (<250 copies / mL). A negative result must be combined with clinical observations, patient history, and epidemiological information.  Fact Sheet for Patients:   StrictlyIdeas.no  Fact Sheet for Healthcare Providers: BankingDealers.co.za  This test is not yet approved or  cleared by the [place] FDA and has been authorized for detection and/or diagnosis of SARS-CoV-2 by FDA under an Emergency Use Authorization (EUA).  This EUA will remain in effect (meaning this test can be used) for the duration of the COVID-19 declaration under Section 564(b)(1) of the Act, 21 U.S.C. section 360bbb-3(b)(1), unless the authorization is terminated or revoked sooner.  Performed at [facility], [place] [address] [place], [place] [address]      Labs: BNP (last 3 results) No results for input(s): BNP in the last 8760 hours. Basic Metabolic Panel: Recent Labs  Lab 10/09/19 0500  NA 140  K 3.2*  CL 109  CO2 22  GLUCOSE 155*  BUN 10  CREATININE 0.94  CALCIUM 8.6*   Liver Function Tests: No results for input(s): AST, ALT, ALKPHOS, BILITOT, PROT, ALBUMIN in the last  168 hours. No results for input(s): LIPASE, AMYLASE in the last 168 hours. No results for input(s): AMMONIA in the last 168 hours. CBC: Recent Labs  Lab 10/09/19 0500  WBC 8.8  NEUTROABS 6.3  HGB 12.0*  HCT 37.0*  MCV 84.7  PLT 225   Cardiac Enzymes: No results for input(s): CKTOTAL, CKMB, CKMBINDEX, TROPONINI in the last 168 hours. BNP: Invalid input(s): POCBNP CBG: Recent Labs  Lab 10/14/19 1202 10/14/19 1628 10/14/19 2141 10/15/19 0629 10/15/19 1145  GLUCAP 222* 146* 166* 169* 139*   D-Dimer No results for input(s): DDIMER in the last 72 hours. Hgb A1c No results for input(s): HGBA1C in the last 72 hours. Lipid Profile No results for input(s): CHOL, HDL, LDLCALC, TRIG, CHOLHDL, LDLDIRECT in the last 72 hours. Thyroid function studies No results for input(s): TSH, T4TOTAL, T3FREE, THYROIDAB in the last 72 hours.  Invalid input(s): FREET3 Anemia work up No results for input(s): VITAMINB12, FOLATE, FERRITIN, TIBC, IRON, RETICCTPCT in the last 72 hours. Urinalysis    Component Value Date/Time   COLORURINE YELLOW 10/09/2019 0354   APPEARANCEUR CLEAR 10/09/2019 0354   LABSPEC 1.041 (H) 10/09/2019 0354   PHURINE 6.0 10/09/2019 0354   GLUCOSEU 50 (A) 10/09/2019 0354   HGBUR NEGATIVE 10/09/2019 0354   BILIRUBINUR NEGATIVE 10/09/2019 0354   KETONESUR NEGATIVE 10/09/2019 0354   PROTEINUR NEGATIVE 10/09/2019 0354   NITRITE NEGATIVE 10/09/2019 0354   LEUKOCYTESUR NEGATIVE 10/09/2019 0354   Sepsis Labs Invalid input(s): PROCALCITONIN,  WBC,  LACTICIDVEN Microbiology Recent Results (from the past 240 hour(s))  SARS Coronavirus 2 by RT PCR (hospital order, performed in [place] hospital lab) Nasopharyngeal Nasopharyngeal Swab     Status: None   Collection Time: 10/08/19  5:40 PM   Specimen: Nasopharyngeal Swab  Result Value Ref Range Status   SARS Coronavirus 2 NEGATIVE NEGATIVE Final    Comment: (NOTE) SARS-CoV-2 target nucleic acids are NOT DETECTED.  The  SARS-CoV-2 RNA is generally detectable in upper and lower respiratory specimens during the acute phase of infection. The lowest concentration of SARS-CoV-2 viral copies this assay can detect is 250 copies / mL. A negative result does not preclude SARS-CoV-2 infection and should not be used as the sole basis for treatment or other patient management decisions.  A negative result may occur with improper specimen collection / handling, submission of specimen other than nasopharyngeal swab, presence of viral mutation(s) within the areas targeted by this assay, and inadequate number of viral copies (<250 copies / mL). A negative result must be combined with clinical observations, patient history, and epidemiological information.  Fact Sheet for Patients:   StrictlyIdeas.no  Fact Sheet for Healthcare Providers: BankingDealers.co.za  This test is not yet approved or  cleared by the [place] FDA and has been authorized for detection and/or diagnosis of SARS-CoV-2 by FDA under an Emergency Use Authorization (EUA).  This EUA will remain in effect (meaning this test can be used) for the duration of the COVID-19 declaration under Section 564(b)(1) of the Act, 21 U.S.C. section 360bbb-3(b)(1), unless the authorization is terminated or revoked sooner.  Performed at [facility], [place] [address], [place], [place] [address]      Time coordinating discharge:   35 minutes  SIGNED:   Barb Merino, MD  [facility]  10/15/2019, 1:10 PM

## 2019-10-15 NOTE — Progress Notes (Signed)
Report given to CIR RN, belongings returned to patient

## 2019-10-15 NOTE — H&P (Addendum)
Physical Medicine and Rehabilitation Admission H&P    Chief Complaint  Patient presents with  . Stroke with functional deficits    HPI: John Douglas is a 64 year old male with history of HTN, wide-complex tachycardia/AF, T2DM with neuropathy, hematochezia with colonoscopy 06/09/2021positive for rectal adenocarcinoma who was admitted via our H on 10/08/2019 with dizziness, several day history of weakness followed by LLE weakness and difficulty walking.  He was found to have A. fib with RVR and CT of head done revealing right PCA infarct with petechial hemorrhage.  CTA head/neck showed normal intracranial and cervical arteries.  Patient had been off Eliquis for a month and on DAPT due to bloody stools.  Dr. Leonie Man felt that stroke was embolic due to A. fib with question hypercoagulopathy with new diagnosis of cancer.  DAPT was held due to petechial hemorrhage and low-dose ASA resumed on 06/11.  CT chest, abdomen, pelvis done for staging and showed retroperitoneal and upper pelvic lymphadenopathy, hyperenhancing focus left hepatic lobe with surrounding parenchymal attenuation felt to be hemangioma and mild distal circumferential esophageal thickening.  MRI pelvis showed rectal adenocarcinoma T4N2 with tumor 4 cm distance from anal sphincter.  Dr. Marin Olp was consulted for input on new diagnosis of rectal cancer in work-up revealed elevated CEA and tumor markers pending.  Patient wants to hold off on Port-A-Cath until after his July 4 beach trip.  MRI liver done and was negative for metastatic disease.  Dr. Donne Hazel  was consulted and recommended chemo and follow-up in office for input --patient not felt to be a candidate for surgery.  He was infused with Feraheme 510 mg due to low iron stores.  He did have trace amount blood in BM on 06/16.  Plavix was resumed with recommendations to monitor for rectal bleeding.   Review of Systems  Constitutional: Negative for chills and fever.  HENT: Negative  for hearing loss and tinnitus.   Eyes: Negative for blurred vision and double vision.  Respiratory: Negative for cough and shortness of breath.   Cardiovascular: Negative for chest pain.  Gastrointestinal: Positive for blood in stool. Negative for abdominal pain, heartburn and nausea.  Musculoskeletal: Negative for myalgias and neck pain.  Skin: Negative for rash.  Neurological: Positive for sensory change (LLE feels cold--worse at nights). Negative for dizziness and headaches.       LLE spasm at night  Psychiatric/Behavioral: Negative for depression. The patient is nervous/anxious and has insomnia.     Past Medical History:  Diagnosis Date  . Allergic rhinitis    [facility]  . Anxiety   . Atrial fibrillation ([place])    [facility]  . Benign essential hypertension    [facility]  . Coronary artery disease    [facility]  . Diabetic neuropathy, type II diabetes mellitus ([place])    [facility]  . Mixed hyperlipidemia    [facility]  . Myocardial infarct [facility])    [facility]  . Obesity, unspecified    [facility]  . Rectal bleeding    [facility]  . Wide-complex tachycardia ([place]) 06/04/2019    Past Surgical History:  Procedure Laterality Date  . BIOPSY  10/07/2019   Procedure: BIOPSY;  Surgeon: Lavena Bullion, DO;  Location: [facility];  Service: Gastroenterology;;  . COLONOSCOPY WITH PROPOFOL N/A 10/07/2019   Procedure: COLONOSCOPY WITH PROPOFOL;  Surgeon: Lavena Bullion, DO;  Location: [facility];  Service: Gastroenterology;  Laterality: N/A;  . CORONARY ANGIOPLASTY     3 stents  . LEFT HEART CATH AND CORONARY ANGIOGRAPHY N/A 06/05/2019   Procedure: LEFT HEART CATH AND CORONARY ANGIOGRAPHY;  Surgeon: Nelva Bush, MD;  Location: [facility];  Service: Cardiovascular;  Laterality:  N/A;  . POLYPECTOMY  10/07/2019   Procedure: POLYPECTOMY;  Surgeon: Lavena Bullion, DO;  Location: [facility];  Service: Gastroenterology;;  . Lia Foyer TATTOO INJECTION  10/07/2019   Procedure: SUBMUCOSAL TATTOO INJECTION;  Surgeon: Lavena Bullion, DO;  Location: [facility];  Service: Gastroenterology;;    Family History  Problem Relation Age of Onset  . Diverticulitis Mother   . Hypertension Father   . Heart disease Father        MI  . Prostate cancer Father   . Colon cancer Neg Hx   . Stomach cancer Neg Hx   . Pancreatic cancer Neg Hx   . Rectal cancer Neg Hx     Social History:  Divorced- lives alone and independent PTA.  Has a girlfriend--risk Hotel manager at [employer]. 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  He reports that he has never smoked. He chews tobacco X 45 years ago--quit smokeless tobacco use about 5 months ago.  His smokeless tobacco use included chew. He uses alcohol on rare basis. He reports current drug use. Drug: Marijuana.    Allergies  Allergen Reactions  . Ibuprofen Other (See Comments)    Was told to not take this   . Metformin And Related     Bloody stools   . Naproxen Other (See Comments)    Was told to not take this   . Penicillins Hives    Did it involve swelling of the face/tongue/throat, SOB, or low BP? Unk Did it involve sudden or severe rash/hives, skin peeling, or any reaction on the inside of your mouth or nose? Yes Did you need to seek medical attention at a hospital or doctor's office? Unk When did it last happen?"Childhood- 55 years ago" If all above answers are "NO", may proceed with cephalosporin use.   . [place]et Venom [Bee Venom] Swelling and Other (See Comments)    Severe swelling where stung    Medications Prior to Admission  Medication Sig Dispense Refill  . aspirin 81 MG chewable tablet Chew 1 tablet (81 mg total) by mouth daily.     . clonazePAM (KLONOPIN) 1 MG disintegrating tablet Take 1 tablet (1 mg total) by mouth 2 (two) times daily as needed (pre-colonoscopy anxiety). 6 tablet 0  . clopidogrel (PLAVIX) 75 MG tablet Take 1 tablet (75 mg total) by mouth daily. 90 tablet 3  . ezetimibe (ZETIA) 10 MG tablet Take 10 mg by mouth at bedtime.    . fluticasone (FLONASE) 50 MCG/ACT nasal spray Place 1-2 sprays into both nostrils daily as needed for allergies or rhinitis (or seasonal allergies).    . insulin aspart (NOVOLOG) 100 UNIT/ML injection Inject 0-5 Units into the skin at bedtime. 10 mL 11  . insulin aspart (NOVOLOG) 100 UNIT/ML injection Inject 0-9 Units into the skin 3 (three) times daily with meals. 10 mL 11  . lisinopril (ZESTRIL) 2.5 MG tablet Take 2.5 mg by mouth daily.    . metoprolol tartrate (LOPRESSOR) 50 MG tablet Take 1.5 tablets (75 mg total) by mouth 2 (two) times daily. 270 tablet 3  . nitroGLYCERIN (NITROSTAT) 0.4 MG SL tablet  Place 0.4 mg under the tongue every 5 (five) minutes as needed for chest pain.     . rosuvastatin (CRESTOR) 20 MG tablet Take 1 tablet (20 mg total) by mouth daily. 90 tablet 2    Drug Regimen Review  Drug regimen was reviewed and remains appropriate with no significant issues identified   Home: Home Living Family/patient expects to be discharged to:: Private residence Living Arrangements: Alone Available Help at Discharge: Family, Available PRN/intermittently Type of Home: House Home Access: Stairs to enter Technical brewer of Steps: 1 Entrance Stairs-Rails: None Home Layout: One level Bathroom Shower/Tub: Government social research officer Accessibility: Yes Home Equipment: None Additional Comments: family reports availability to cane and wc if needed  Lives With: Alone   Functional History: Prior Function Level of Independence: Independent Comments: pt reports working, driving, and independence in all ADLs  Functional Status:   Mobility: Bed Mobility Overal bed mobility: Needs Assistance Bed Mobility: Supine to Sit, Sit to Supine Supine to sit: Supervision Sit to supine: Supervision General bed mobility comments: supervision for safety.  Transfers Overall transfer level: Needs assistance Equipment used: Rolling walker (2 wheeled), 1 person hand held assist Transfers: Sit to/from Stand Sit to Stand: Min assist General transfer comment: Practiced standing with both HHA and RW. Pt requiring min A for steadying.  Ambulation/Gait Ambulation/Gait assistance: Mod assist, Min assist Gait Distance (Feet): 100 Feet Assistive device: Rolling walker (2 wheeled) Gait Pattern/deviations: Decreased step length - left, Wide base of support, Step-through pattern, Decreased dorsiflexion - left, Decreased stride length General Gait Details: Cues for step height and heel strike on the L. Required safety cues for safe use of RW. With increased distance, pt requiring increased support. Min to mod A for steadying.  Gait velocity: Decreased  ADL: ADL Overall ADL's : Needs assistance/impaired Grooming: Oral care, Minimal assistance, With adaptive equipment, Standing Grooming Details (indicate cue type and reason): Min A for steadying at sink while standing  Upper Body Bathing: Supervision/ safety, Sitting Upper Body Bathing Details (indicate cue type and reason): supervision for safety Lower Body Bathing: Supervison/ safety, Sitting/lateral leans Lower Body Bathing Details (indicate cue type and reason): supervision for safety Upper Body Dressing : Supervision/safety, Sitting Upper Body Dressing Details (indicate cue type and reason): supervision for safety Lower Body Dressing: Minimal assistance, Sit to/from stand Lower Body Dressing Details (indicate cue type and reason): Min A to help with balance Toilet Transfer: Minimal assistance, Ambulation, Regular Toilet Toilet Transfer Details (indicate cue type and reason): Min A to  steady upon standing Toileting- Clothing Manipulation and Hygiene: Minimal assistance Toileting - Clothing Manipulation Details (indicate cue type and reason): Min A to steady Tub/Shower Transfer Details (indicate cue type and reason): deferred assess next session Functional mobility during ADLs: Minimal assistance, Rolling walker, Cueing for safety General ADL Comments: Pt requires supervision when seated. Pt has decreased balance and is impoulsive and unsafe with walker. Needs verbal cues and min A to complete ADLs in standing.   Cognition: Cognition Overall Cognitive Status: Impaired/Different from baseline Arousal/Alertness: Awake/alert Orientation Level: Oriented X4 Attention: Selective Selective Attention: Appears intact Memory: Appears intact Awareness: Impaired Awareness Impairment: Emergent impairment Problem Solving: Impaired Problem Solving Impairment: Functional complex Safety/Judgment: Impaired Cognition Arousal/Alertness: Awake/alert Behavior During Therapy: WFL for tasks assessed/performed Overall Cognitive Status: Impaired/Different from baseline Area of Impairment: Attention, Memory, Following commands, Safety/judgement, Awareness Current Attention Level: Selective Memory: Decreased short-term memory Following Commands: Follows one step commands consistently, Follows multi-step commands inconsistently Safety/Judgement: Decreased awareness of  deficits Awareness: Emergent Problem Solving: Difficulty sequencing, Slow processing, Requires verbal cues General Comments: Conducted Trail Making Test (TMT) parts A & B and pt presented deficits in STM, decreased ability to sequence, decreased ability to follow multiple step commands, and decreased awareness of deficits.  Blood pressure (!) 136/92, pulse 65, temperature 98.8 F (37.1 C), temperature source Oral, resp. rate 18, height 5\' 10"  (1.778 m), weight 94.8 kg, SpO2 96 %.  Physical Exam  General: Alert and oriented x  3, No apparent distress HEENT: Head is normocephalic, atraumatic, PERRLA, EOMI, sclera anicteric, oral mucosa pink and moist, dentition intact, ext ear canals clear,  Neck: Supple without JVD or lymphadenopathy Heart: Reg rate and rhythm. No murmurs rubs or gallops Chest: CTA bilaterally without wheezes, rales, or rhonchi; no distress Abdomen: Soft, non-tender, non-distended, bowel sounds positive. Extremities: No clubbing, cyanosis, or edema. Pulses are 2+ Skin: Clean and intact without signs of breakdown Neuro: Alert and oriented x3. Impaired recall. Follows 1 step commands but difficulty with mult-step commands. Decreased awareness of safety deficits. Cranial nerves 2-12 are intact. Motor function is grossly 5/5 on right side, 4+/5 left upper extremity and 4-/5 in left lower extremity with ataxia. Performed sit to stand with MinA  Psych: Pt's affect is appropriate. Pt is cooperative  Results for orders placed or performed during the hospital encounter of 10/15/19 (from the past 48 hour(s))  Glucose, capillary     Status: Abnormal   Collection Time: 10/15/19  4:40 PM  Result Value Ref Range   Glucose-Capillary 134 (H) 70 - 99 mg/dL    Comment: Glucose reference range applies only to samples taken after fasting for at least 8 hours.   No results found.     Medical Problem List and Plan: 1.  Impaired mobility and ADLs secondary to acute ischemic right PCA stroke.  -patient may shower  -ELOS/Goals: 7 days 2.  Antithrombotics: -DVT/anticoagulation:  Mechanical: Sequential compression devices, below knee Bilateral lower extremities and TEDs  -antiplatelet therapy: DAPT started --monitor for signs of bleeding.  3. Pain Management: Will add low dose gabapentin and change robaxin to baclofen prn  muscle spasms. Currently well controlled.  4. Mood: LCSW to follow for evaluation and support.   -antipsychotic agents: N/A 5. Neuropsych: This patient is capable of making decisions on his own  behalf. 6. Skin/Wound Care: Routine pressure relief measures. 7. Fluids/Electrolytes/Nutrition: Monitor intake/output.  Offer supplements as needed Po intake 8.  CAD s/p PCI to RCA 12/20: On DAPT and Crestor.  9.  T2DM: Hemoglobin A1c 8.0.  Blood sugars ac/hs--has been off meds X 1 year due to improvement in A1c-5.4 (loss weight and dieting).    10.  Hematochezia,/iron deficiency anemia: Recheck CBC in a.m. 11.  Hypokalemia: Recheck labs in a.m. 12.  Stage IV colon cancer: Monitor for signs of bleeding is now back on DAPT. 13.  PAF: Monitor heart rate 3 times daily.  Continue metoprolol twice daily. Currently well controlled.  14.  HTN: Monitor blood pressures 3 times daily.  Continue low-dose lisinopril as well as metoprolol. Currently well controlled.  15. Situational anxiety: Would like to be started on something. Will start Celexa. Continue Klonopin prn has being effective  Reesa Chew, PA-C  I have personally performed a face to face diagnostic evaluation, including, but not limited to relevant history and physical exam findings, of this patient and developed relevant assessment and plan.  Additionally, I have reviewed and concur with the physician assistant's documentation above.  The patient's status  has not changed. The original post admission physician evaluation remains appropriate, and any changes from the pre-admission screening or documentation from the acute chart are noted above.   Leeroy Cha, MD

## 2019-10-15 NOTE — Progress Notes (Signed)
Inpatient Rehabilitation Medication Review by a Pharmacist  A complete drug regimen review was completed for this patient to identify any potential clinically significant medication issues.  Clinically significant medication issues were identified:  no   Name of provider notified for urgent issues identified:   Provider Method of Notification:    For non-urgent medication issues to be resolved on team rounds tomorrow morning a [facility] [employer] [place] was sent to:    Pharmacist comments:   Time spent performing this drug regimen review (minutes):  10    [place] 10/15/2019 4:44 PM

## 2019-10-15 NOTE — Progress Notes (Signed)
PROGRESS NOTE    John Douglas  TIR:[id number] DOB: 1955/05/23 DOA: 10/08/2019 PCP: Leonides Sake, MD    Brief Narrative:  64 year old gentleman with extensive cardiovascular problems.  - STEMI and PCI to RCA in [id number]/[id number] complicated by sustained ventricular tachycardia .  Aspirin and Brilinta - Non-STEMI and subsequent PCI to LAD on 06/2019 along with paroxysmal A. fib.  Aspirin and Plavix. Waited 3 months for colonoscopy because of drug-eluting stent and NSTEMI. Patient was having intermittent hematochezia for more than 6 months before even he had ST elevation MI.   6/9, Plavix held for 1 week and continued on aspirin, underwent colonoscopy, found to have adenocarcinoma of the colon with partially obstructive lesion. Since the day of colonoscopy or the night before only he was having some weakness, dragging his left foot, dizzy and disoriented.  He reported the symptoms while he was taking bowel prep that was thought to be from bowel prep.  6/10, went back to [facility] ER.  Found to be in A. fib with RVR heart rate 150.  CT head showed right PCA distribution infarct and transferred to [facility].   Assessment & Plan:   Principal Problem:   Ischemic stroke [facility]) Active Problems:   Paroxysmal atrial fibrillation (HCC)   Essential hypertension   Hyperlipidemia LDL goal <70   Coronary artery disease   Ischemic cardiomyopathy   Leukocytosis   Hypokalemia   Colon cancer (HCC)  Right PCA territory ischemic infarct with petechial hemorrhagic conversion: With history of paroxysmal A. Fib Clinical findings, dizziness, lightheadedness and confusion, left leg weakness. CT head findings, right PCA territory ischemic infarct. MRI of the brain, acute infarct right PCA territory, posterior limb of internal capsule.  Petechial blood within the posterior medial right temporal lobe.  No mass-effect. CTA of the head and neck, no large vessel occlusion. 2D echocardiogram,normal. Echocardiogram  06/2019 with ejection fraction 45%. Antiplatelet therapy, aspirin at home continued. Plavix resumed 6/16 LDL 98.  Last LDL 32 on 4/30.  On rosuvastatin and Zetia.  Continue. Hemoglobin A1c 8.  Not on treatment.  Was on Metformin in the past so he must be diagnosed with diabetes. Continue to work with PT OT.  Appreciate neurology input.  Stable to transfer to CIR. 6/16, there is no plan for Port-A-Cath placement at least before July 4, will resume Plavix.  Paroxysmal atrial fibrillation:  On sinus rhythm.  On metoprolol.  Eliquis discontinued due to rectal bleeding.  Resuming aspirin and Plavix.  Coronary artery disease, ST elevation MI 03/2019, non-STEMI 05/2018 with VT:  Currently stable.  Without chest pain. Patient metoprolol and lisinopril.  On aspirin.  Resume Plavix and monitor for rectal bleeding.  Hematochezia/recently diagnosed adenocarcinoma of the colon:  New diagnosed metastatic adenocarcinoma.  Followed by oncology.   Staging MR and Ct chest / abdomen completed.  Followed by Dr Marin Olp  Further plan as per oncology. Patient had trace of blood on his bowel movement 6/16, will continue to monitor.  Hypokalemia:  Replaced.  Patient had a conversation with Dr. Marin Olp.  The plan not to have a Port-A-Cath placement.  He does have secretory diarrhea but no significant hematochezia. Tolerating Plavix.  DVT prophylaxis: SCD Code Status: Full code Family Communication: None today. Disposition Plan: Status is: Inpatient  Remains inpatient appropriate because: Stabilizing.  Anticipating transfer to rehab with oncology follow-up for neoadjuvant therapy.   Dispo: The patient is from: Home              Anticipated d/c is  to: Inpatient rehab.              Anticipated d/c date is: Medically stable.              Patient currently medically stable to transfer to rehab.         Consultants:   Oncology  Neurology  Procedures:   None  Antimicrobials:    None   Subjective: Patient seen and examined. Could not sleep last night. He had cold feet. No other events. Had trace of blood yesterday evening with his bowel movement. He had a clear bowel movement today morning.  Objective: Vitals:   10/14/19 2216 10/15/19 0001 10/15/19 0311 10/15/19 0739  BP: (!) 142/80 (!) 144/72 132/78 139/62  Pulse: 62 (!) 58 (!) 58 75  Resp:  16 16 18   Temp:  98.8 F (37.1 C) 97.8 F (36.6 C) 98.9 F (37.2 C)  TempSrc:  Oral Oral Oral  SpO2:  98% 98% 99%  Weight:      Height:        Intake/Output Summary (Last 24 hours) at 10/15/2019 1040 Last data filed at 10/15/2019 0700 Gross per 24 hour  Intake 960 ml  Output --  Net 960 ml   Filed Weights   10/08/19 1122  Weight: 99.8 kg    Examination:  Physical Exam HENT:     Head: Normocephalic.  Eyes:     Pupils: Pupils are equal, round, and reactive to light.  Cardiovascular:     Rate and Rhythm: Normal rate and regular rhythm.  Pulmonary:     Breath sounds: Normal breath sounds.  Abdominal:     Palpations: Abdomen is soft.  Neurological:     Mental Status: He is alert and oriented to person, place, and time.     Comments: Cranial nerves no deficit. Mild weakness on the left lower extremity, drift present.  Able to elevate against gravity.   Obvious left lower extremity deficit visible on walking in the hallway.   Data Reviewed: I have personally reviewed following labs and imaging studies  CBC: Recent Labs  Lab 10/08/19 1128 10/09/19 0500  WBC 12.3* 8.8  NEUTROABS  --  6.3  HGB 12.2* 12.0*  HCT 38.1* 37.0*  MCV 85.8 84.7  PLT 256 025   Basic Metabolic Panel: Recent Labs  Lab 10/08/19 1128 10/09/19 0500  NA 137 140  K 3.4* 3.2*  CL 106 109  CO2 19* 22  GLUCOSE 194* 155*  BUN 8 10  CREATININE 0.90 0.94  CALCIUM 8.2* 8.6*  MG 1.8  --    GFR: Estimated Creatinine Clearance: 92.8 mL/min (by C-G formula based on SCr of 0.94 mg/dL). Liver Function Tests: No results  for input(s): AST, ALT, ALKPHOS, BILITOT, PROT, ALBUMIN in the last 168 hours. No results for input(s): LIPASE, AMYLASE in the last 168 hours. No results for input(s): AMMONIA in the last 168 hours. Coagulation Profile: No results for input(s): INR, PROTIME in the last 168 hours. Cardiac Enzymes: No results for input(s): CKTOTAL, CKMB, CKMBINDEX, TROPONINI in the last 168 hours. BNP (last 3 results) No results for input(s): PROBNP in the last 8760 hours. HbA1C: No results for input(s): HGBA1C in the last 72 hours. CBG: Recent Labs  Lab 10/14/19 0622 10/14/19 1202 10/14/19 1628 10/14/19 2141 10/15/19 0629  GLUCAP 141* 222* 146* 166* 169*   Lipid Profile: No results for input(s): CHOL, HDL, LDLCALC, TRIG, CHOLHDL, LDLDIRECT in the last 72 hours. Thyroid Function Tests: No results for input(s):  TSH, T4TOTAL, FREET4, T3FREE, THYROIDAB in the last 72 hours. Anemia Panel: No results for input(s): VITAMINB12, FOLATE, FERRITIN, TIBC, IRON, RETICCTPCT in the last 72 hours. Sepsis Labs: No results for input(s): PROCALCITON, LATICACIDVEN in the last 168 hours.  Recent Results (from the past 240 hour(s))  SARS Coronavirus 2 by RT PCR (hospital order, performed in [facility] hospital lab) Nasopharyngeal Nasopharyngeal Swab     Status: None   Collection Time: 10/08/19  5:40 PM   Specimen: Nasopharyngeal Swab  Result Value Ref Range Status   SARS Coronavirus 2 NEGATIVE NEGATIVE Final    Comment: (NOTE) SARS-CoV-2 target nucleic acids are NOT DETECTED.  The SARS-CoV-2 RNA is generally detectable in upper and lower respiratory specimens during the acute phase of infection. The lowest concentration of SARS-CoV-2 viral copies this assay can detect is 250 copies / mL. A negative result does not preclude SARS-CoV-2 infection and should not be used as the sole basis for treatment or other patient management decisions.  A negative result may occur with improper specimen collection / handling,  submission of specimen other than nasopharyngeal swab, presence of viral mutation(s) within the areas targeted by this assay, and inadequate number of viral copies (<250 copies / mL). A negative result must be combined with clinical observations, patient history, and epidemiological information.  Fact Sheet for Patients:   StrictlyIdeas.no  Fact Sheet for Healthcare Providers: BankingDealers.co.za  This test is not yet approved or  cleared by the [place] FDA and has been authorized for detection and/or diagnosis of SARS-CoV-2 by FDA under an Emergency Use Authorization (EUA).  This EUA will remain in effect (meaning this test can be used) for the duration of the COVID-19 declaration under Section 564(b)(1) of the Act, 21 U.S.C. section 360bbb-3(b)(1), unless the authorization is terminated or revoked sooner.  Performed at [facility], [place] [address] [place], [place] [address]          Radiology Studies: No results found.      Scheduled Meds: . aspirin  81 mg Oral Daily  . clopidogrel  75 mg Oral Daily  . ezetimibe  10 mg Oral QHS  . insulin aspart  0-5 Units Subcutaneous QHS  . insulin aspart  0-9 Units Subcutaneous TID WC  . lisinopril  2.5 mg Oral Daily  . metoprolol tartrate  75 mg Oral BID  . rosuvastatin  20 mg Oral Daily   Continuous Infusions:   LOS: 7 days    Time spent: 25 minutes    Barb Merino, MD [facility] Pager [phone]

## 2019-10-16 ENCOUNTER — Inpatient Hospital Stay (HOSPITAL_COMMUNITY): Payer: Medicare HMO | Admitting: Speech Pathology

## 2019-10-16 ENCOUNTER — Inpatient Hospital Stay (HOSPITAL_COMMUNITY): Payer: Medicare HMO | Admitting: Occupational Therapy

## 2019-10-16 ENCOUNTER — Inpatient Hospital Stay (HOSPITAL_COMMUNITY): Payer: Medicare HMO

## 2019-10-16 LAB — CBC WITH DIFFERENTIAL/PLATELET
Abs Immature Granulocytes: 0.07 10*3/uL (ref 0.00–0.07)
Basophils Absolute: 0.1 10*3/uL (ref 0.0–0.1)
Basophils Relative: 1 %
Eosinophils Absolute: 0.2 10*3/uL (ref 0.0–0.5)
Eosinophils Relative: 1 %
HCT: 40 % (ref 39.0–52.0)
Hemoglobin: 13.3 g/dL (ref 13.0–17.0)
Immature Granulocytes: 1 %
Lymphocytes Relative: 16 %
Lymphs Abs: 1.8 10*3/uL (ref 0.7–4.0)
MCH: 27.5 pg (ref 26.0–34.0)
MCHC: 33.3 g/dL (ref 30.0–36.0)
MCV: 82.8 fL (ref 80.0–100.0)
Monocytes Absolute: 0.8 10*3/uL (ref 0.1–1.0)
Monocytes Relative: 7 %
Neutro Abs: 8.3 10*3/uL — ABNORMAL HIGH (ref 1.7–7.7)
Neutrophils Relative %: 74 %
Platelets: 284 10*3/uL (ref 150–400)
RBC: 4.83 MIL/uL (ref 4.22–5.81)
RDW: 15.9 % — ABNORMAL HIGH (ref 11.5–15.5)
WBC: 11.1 10*3/uL — ABNORMAL HIGH (ref 4.0–10.5)
nRBC: 0 % (ref 0.0–0.2)

## 2019-10-16 LAB — COMPREHENSIVE METABOLIC PANEL
ALT: 15 U/L (ref 0–44)
AST: 12 U/L — ABNORMAL LOW (ref 15–41)
Albumin: 3.3 g/dL — ABNORMAL LOW (ref 3.5–5.0)
Alkaline Phosphatase: 51 U/L (ref 38–126)
Anion gap: 9 (ref 5–15)
BUN: 15 mg/dL (ref 8–23)
CO2: 25 mmol/L (ref 22–32)
Calcium: 9.1 mg/dL (ref 8.9–10.3)
Chloride: 104 mmol/L (ref 98–111)
Creatinine, Ser: 1.08 mg/dL (ref 0.61–1.24)
GFR calc Af Amer: >60 mL/min (ref >60)
GFR calc non Af Amer: >60 mL/min (ref >60)
Glucose, Bld: 146 mg/dL — ABNORMAL HIGH (ref 70–99)
Potassium: 3.7 mmol/L (ref 3.5–5.1)
Sodium: 138 mmol/L (ref 135–145)
Total Bilirubin: 0.7 mg/dL (ref 0.3–1.2)
Total Protein: 6.1 g/dL — ABNORMAL LOW (ref 6.5–8.1)

## 2019-10-16 LAB — GLUCOSE, CAPILLARY
Glucose-Capillary: 130 mg/dL — ABNORMAL HIGH (ref 70–99)
Glucose-Capillary: 148 mg/dL — ABNORMAL HIGH (ref 70–99)
Glucose-Capillary: 174 mg/dL — ABNORMAL HIGH (ref 70–99)
Glucose-Capillary: 125 mg/dL — ABNORMAL HIGH (ref 70–99)

## 2019-10-16 NOTE — Progress Notes (Signed)
Therapy assisted pt to bathroom. Pt continent of soft bm. Noted have blood in bm. Pt denies pain or discomfort, had colonoscopy recently. Will monitor pt.

## 2019-10-16 NOTE — Evaluation (Signed)
Speech Language Pathology Assessment and Plan  Patient Details  Name: John Douglas MRN: [id number] Date of Birth: 1956/02/19  SLP Diagnosis: Other (comment) (n/a for ST)  Rehab Potential: Excellent ELOS: n/a for ST    Today's Date: 10/16/2019 SLP Individual Time: 9381-0175 SLP Individual Time Calculation (min): 46 min   Problem List:  Patient Active Problem List   Diagnosis Date Noted  . Acute ischemic right PCA stroke ([place]) 10/15/2019  . Ischemic stroke ([place]) 10/08/2019  . Leukocytosis   . Hypokalemia   . Colon cancer ([place])   . Change in bowel habits   . Rectal bleeding   . Rectal mass   . Rectal polyp   . Polyp of descending colon   . Diverticulosis of colon without hemorrhage   . Gastrointestinal hemorrhage 08/29/2019  . Coronary artery disease 08/29/2019  . Ischemic cardiomyopathy 08/29/2019  . Paroxysmal atrial fibrillation (HCC)   . Essential hypertension   . Hyperlipidemia LDL goal <70   . Non-ST elevation (NSTEMI) myocardial infarction ([place])   . Ventricular tachycardia ([place]) 06/04/2019   Past Medical History:  Past Medical History:  Diagnosis Date  . Allergic rhinitis    [facility]  . Anxiety   . Atrial fibrillation ([place])    [facility]  . Benign essential hypertension    [facility]  . Coronary artery disease    [facility]  . Diabetic neuropathy, type II diabetes mellitus ([place])    [facility]  . Mixed hyperlipidemia    [facility]  . Myocardial infarct [facility])    [facility]  . Obesity, unspecified    [facility]  . Rectal bleeding     [facility]  . Wide-complex tachycardia ([place]) 06/04/2019   Past Surgical History:  Past Surgical History:  Procedure Laterality Date  . BIOPSY  10/07/2019   Procedure: BIOPSY;  Surgeon: Lavena Bullion, DO;  Location: [facility];   Service: Gastroenterology;;  . COLONOSCOPY WITH PROPOFOL N/A 10/07/2019   Procedure: COLONOSCOPY WITH PROPOFOL;  Surgeon: Lavena Bullion, DO;  Location: [facility];  Service: Gastroenterology;  Laterality: N/A;  . CORONARY ANGIOPLASTY     3 stents  . LEFT HEART CATH AND CORONARY ANGIOGRAPHY N/A 06/05/2019   Procedure: LEFT HEART CATH AND CORONARY ANGIOGRAPHY;  Surgeon: Nelva Bush, MD;  Location: [place] [facility] LAB;  Service: Cardiovascular;  Laterality: N/A;  . POLYPECTOMY  10/07/2019   Procedure: POLYPECTOMY;  Surgeon: Lavena Bullion, DO;  Location: [facility];  Service: Gastroenterology;;  . Lia Foyer TATTOO INJECTION  10/07/2019   Procedure: SUBMUCOSAL TATTOO INJECTION;  Surgeon: Lavena Bullion, DO;  Location: [facility];  Service: Gastroenterology;;    Assessment / Plan / Recommendation Clinical Impression   HPI: John Douglas is a 64 year old male with history of HTN, wide-complex tachycardia/AF, T2DM with neuropathy, hematochezia with colonoscopy 06/09/2021positive for rectal adenocarcinoma who was admitted via our H on 10/08/2019 with dizziness, several day history of weakness followed by LLE weakness and difficulty walking.  He was found to have A. fib with RVR and CT of head done revealing right PCA infarct with petechial hemorrhage.  CTA head/neck showed normal intracranial and cervical arteries.  Patient had been off Eliquis for a month and on DAPT due to bloody stools.  Dr. Leonie Douglas felt that stroke was embolic due to A. fib with question hypercoagulopathy with new diagnosis of cancer.  DAPT was  held due to petechial hemorrhage and low-dose ASA resumed on 06/11. CT chest, abdomen, pelvis done for staging and showed retroperitoneal and upper pelvic lymphadenopathy, hyperenhancing focus left hepatic lobe with surrounding parenchymal attenuation felt to be hemangioma and mild distal circumferential esophageal thickening.  MRI pelvis showed rectal adenocarcinoma T4N2 with tumor 4  cm distance from anal sphincter.  Dr. Marin Douglas was consulted for input on new diagnosis of rectal cancer in work-up revealed elevated CEA and tumor markers pending.  Patient wants to hold off on Port-A-Cath until after his July 4 beach trip.  MRI liver done and was negative for metastatic disease.  Dr. Donne Douglas  was consulted and recommended chemo and follow-up in office for input --patient not felt to be a candidate for surgery.  He was infused with Feraheme 510 mg due to low iron stores.  He did have trace amount blood in BM on 06/16.  Plavix was resumed with recommendations to monitor for rectal bleeding.  Pt admitted to CIR 10/15/19 and SLP evaluation was completed 10/16/19 with results as follows:  Pt presents with cognitive-linguistic function WNL and at baseline level of functioning. He scored 25/30 on MOCA version 7.1, which is 1 point shy of score considered WNL. He required 1 category cue for recall of 1/5 words during delayed recall task, however when prompted to recall more functional complex information, pt with 100% accuracy and denies acute changes in cognitive function now; he reported changes in attention on acute levels of care, however has since resolved. Pt demonstrated excellent sustained, selective, and alternating attention abilities during evaluation. Pt's speech was 100% intelligible in conversation, and receptive/expressive language WFL. Therefore, no skilled ST is indicated at this time.    Skilled Therapeutic Interventions          Cognitive-linguistic evaluation was administered and results were reviewed with pt (please see above for details regarding results).    SLP Assessment  Patient does not need any further Speech Lanaguage Pathology Services    Recommendations  Patient destination: Home Follow up Recommendations: None Equipment Recommended: None recommended by SLP         SLP Duration   n/a for ST          Pain Pain Assessment Pain Scale: 0-10 Pain Score:  0-No pain      SLP Evaluation Cognition Overall Cognitive Status: Within Functional Limits for tasks assessed Arousal/Alertness: Awake/alert Orientation Level: Oriented X4 Attention: Alternating Selective Attention: Appears intact Alternating Attention: Appears intact Memory: Appears intact Awareness: Appears intact Problem Solving: Appears intact Executive Function: Sequencing;Self Correcting Sequencing: Appears intact Self Correcting: Appears intact Safety/Judgment: Appears intact  Comprehension Auditory Comprehension Overall Auditory Comprehension: Appears within functional limits for tasks assessed Yes/No Questions: Within Functional Limits Commands: Within Functional Limits Conversation: Complex Visual Recognition/Discrimination Discrimination: Within Function Limits Reading Comprehension Reading Status: Within funtional limits Expression Expression Primary Mode of Expression: Verbal Verbal Expression Overall Verbal Expression: Appears within functional limits for tasks assessed Non-Verbal Means of Communication: Not applicable Written Expression Written Expression: Within Functional Limits Oral Motor Oral Motor/Sensory Function Overall Oral Motor/Sensory Function: Within functional limits Motor Speech Overall Motor Speech: Appears within functional limits for tasks assessed Phonation: Normal Intelligibility: Intelligible   Intelligibility: Intelligible   Short Term Goals: No short term goals set  Refer to Care Plan for Long Term Goals  Recommendations for other services: None   Discharge Criteria: Patient will be discharged from SLP if patient refuses treatment 3 consecutive times without medical reason, if treatment goals not  met, if there is a change in medical status, if patient makes no progress towards goals or if patient is discharged from hospital.  The above assessment, treatment plan, treatment alternatives and goals were discussed and  mutually agreed upon: by patient  Arbutus Leas 10/16/2019, 8:28 AM

## 2019-10-16 NOTE — Progress Notes (Signed)
Pt reported to have a second bloody BM, reported to have clots and be very bloody. Flushed before RN had chance to observe. On call MD Raulkar updated. New orders received.

## 2019-10-16 NOTE — Plan of Care (Signed)
LTGs established °

## 2019-10-16 NOTE — Progress Notes (Signed)
John Douglas PHYSICAL MEDICINE & REHABILITATION PROGRESS NOTE   Subjective/Complaints: John Douglas has no complaints this morning. Denies pain, constipation, insomnia. Muscle cramps have gone. WBC 11.1 today, up from 8.8 one week ago.   ROS: Denies pain, constipation, insomnia, muscle cramps.   Objective:   No results found. Recent Labs    10/16/19 0528  WBC 11.1*  HGB 13.3  HCT 40.0  PLT 284   Recent Labs    10/16/19 0528  NA 138  K 3.7  CL 104  CO2 25  GLUCOSE 146*  BUN 15  CREATININE 1.08  CALCIUM 9.1    Intake/Output Summary (Last 24 hours) at 10/16/2019 1144 Last data filed at 10/16/2019 0800 Gross per 24 hour  Intake 716 ml  Output --  Net 716 ml     Physical Exam: Vital Signs Blood pressure (!) 144/72, pulse 61, temperature 98.1 F (36.7 C), resp. rate 16, height 5\' 10"  (1.778 m), weight 94.8 kg, SpO2 97 %. General: Alert and oriented x 3, No apparent distress HEENT: Head is normocephalic, atraumatic, PERRLA, EOMI, sclera anicteric, oral mucosa pink and moist, dentition intact, ext ear canals clear,  Neck: Supple without JVD or lymphadenopathy Heart: Reg rate and rhythm. No murmurs rubs or gallops Chest: CTA bilaterally without wheezes, rales, or rhonchi; no distress Abdomen: Soft, non-tender, non-distended, bowel sounds positive. Extremities: No clubbing, cyanosis, or edema. Pulses are 2+ Skin: Clean and intact without signs of breakdown Neuro: Alert and oriented x3. Impaired delayed recall. Cranial nerves 2-12 are intact. Motor function is grossly 5/5 on right side, 4+/5 left upper extremity and 4-/5 in left lower extremity with ataxia. Performed sit to stand with MinA  Psych: Pt's affect is appropriate. Pt is cooperative  Assessment/Plan: 1. Functional deficits secondary to acute ischemic left PCA stroke which require 3+ hours per day of interdisciplinary therapy in a comprehensive inpatient rehab setting.  Physiatrist is providing close team  supervision and 24 hour management of active medical problems listed below.  Physiatrist and rehab team continue to assess barriers to discharge/monitor patient progress toward functional and medical goals  Care Tool:  Bathing              Bathing assist       Upper Body Dressing/Undressing Upper body dressing   What is the patient wearing?: Hospital gown only    Upper body assist Assist Level: Moderate Assistance - Patient 50 - 74%    Lower Body Dressing/Undressing Lower body dressing      What is the patient wearing?: Underwear/pull up     Lower body assist Assist for lower body dressing: Moderate Assistance - Patient 50 - 74%     Toileting Toileting    Toileting assist Assist for toileting: Minimal Assistance - Patient > 75%     Transfers Chair/bed transfer  Transfers assist     Chair/bed transfer assist level: Minimal Assistance - Patient > 75%     Locomotion Ambulation   Ambulation assist      Assist level: Contact Guard/Touching assist Assistive device: Walker-rolling     Walk 10 feet activity   Assist           Walk 50 feet activity   Assist           Walk 150 feet activity   Assist           Walk 10 feet on uneven surface  activity   Assist  Wheelchair     Assist               Wheelchair 50 feet with 2 turns activity    Assist            Wheelchair 150 feet activity     Assist          Blood pressure (!) 144/72, pulse 61, temperature 98.1 F (36.7 C), resp. rate 16, height 5\' 10"  (1.778 m), weight 94.8 kg, SpO2 97 %.    Medical Problem List and Plan: 1.  Impaired mobility and ADLs secondary to acute ischemic right PCA stroke.             -patient may shower             -ELOS/Goals: 7 days  -Initial CIR evaluations today 2.  Antithrombotics: -DVT/anticoagulation:  Mechanical: Sequential compression devices, below knee Bilateral lower extremities and TEDs              -antiplatelet therapy: DAPT started --monitor for signs of bleeding.  3. Pain Management: Will add low dose gabapentin and change robaxin to baclofen prn  muscle spasms. Currently well controlled.  4. Mood: LCSW to follow for evaluation and support.              -antipsychotic agents: N/A 5. Neuropsych: This patient is capable of making decisions on his own behalf. 6. Skin/Wound Care: Routine pressure relief measures. 7. Fluids/Electrolytes/Nutrition: Monitor intake/output.  Offer supplements as needed Po intake 8.  CAD s/p PCI to RCA 12/20: On DAPT and Crestor.  9.  T2DM: Hemoglobin A1c 8.0.  Blood sugars ac/hs--has been off meds X 1 year due to improvement in A1c-5.4 (loss weight and dieting).    10.  Hematochezia,/iron deficiency anemia: Stable 11.  Hypokalemia: Stable 12.  Stage IV colon cancer: Monitor for signs of bleeding is now back on DAPT. 13.  PAF: Monitor heart rate 3 times daily.  Continue metoprolol twice daily. Currently well controlled.  14.  HTN: Monitor blood pressures 3 times daily.  Continue low-dose lisinopril as well as metoprolol. Currently well controlled.  15. Situational anxiety: Would like to be started on something. Will start Celexa. Continue Klonopin prn has being effective 16. Insomnia: slept better last night.  17. Leukocytosis: trending upward; repeat CBC tomorrow.   LOS: 1 days A FACE TO FACE EVALUATION WAS PERFORMED  John Douglas 10/16/2019, 11:44 AM

## 2019-10-16 NOTE — Evaluation (Signed)
Occupational Therapy Assessment and Plan  Patient Details  Name: John Douglas MRN: [id number] Date of Birth: Sep 23, 1955  OT Diagnosis: abnormal posture and hemiplegia affecting non-dominant side Rehab Potential:   ELOS: 7-10 days   Today's Date: 10/16/2019 OT Individual Time: 7412-8786 OT Individual Time Calculation (min): 60 min     Problem List:  Patient Active Problem List   Diagnosis Date Noted  . Acute ischemic right PCA stroke ([place]) 10/15/2019  . Ischemic stroke ([place]) 10/08/2019  . Leukocytosis   . Hypokalemia   . Colon cancer ([place])   . Change in bowel habits   . Rectal bleeding   . Rectal mass   . Rectal polyp   . Polyp of descending colon   . Diverticulosis of colon without hemorrhage   . Gastrointestinal hemorrhage 08/29/2019  . Coronary artery disease 08/29/2019  . Ischemic cardiomyopathy 08/29/2019  . Paroxysmal atrial fibrillation ([facility])   . Essential hypertension   . Hyperlipidemia LDL goal <70   . Non-ST elevation (NSTEMI) myocardial infarction ([place])   . Ventricular tachycardia ([place]) 06/04/2019    Past Medical History:  Past Medical History:  Diagnosis Date  . Allergic rhinitis    [facility]  . Anxiety   . Atrial fibrillation ([place])    [facility]  . Benign essential hypertension    [facility]  . Coronary artery disease    [facility]  . Diabetic neuropathy, type II diabetes mellitus ([place])    [facility]  . Mixed hyperlipidemia    [facility]  . Myocardial infarct [facility])    [facility]  . Obesity, unspecified    [facility]  . Rectal bleeding     [facility]  . Wide-complex tachycardia ([place]) 06/04/2019   Past Surgical History:  Past Surgical History:  Procedure Laterality Date  . BIOPSY  10/07/2019   Procedure: BIOPSY;  Surgeon: Lavena Bullion, DO;  Location:  [facility];  Service: Gastroenterology;;  . COLONOSCOPY WITH PROPOFOL N/A 10/07/2019   Procedure: COLONOSCOPY WITH PROPOFOL;  Surgeon: Lavena Bullion, DO;  Location: [facility];  Service: Gastroenterology;  Laterality: N/A;  . CORONARY ANGIOPLASTY     3 stents  . LEFT HEART CATH AND CORONARY ANGIOGRAPHY N/A 06/05/2019   Procedure: LEFT HEART CATH AND CORONARY ANGIOGRAPHY;  Surgeon: Nelva Bush, MD;  Location: [place] [facility];  Service: Cardiovascular;  Laterality: N/A;  . POLYPECTOMY  10/07/2019   Procedure: POLYPECTOMY;  Surgeon: Lavena Bullion, DO;  Location: [facility];  Service: Gastroenterology;;  . Lia Foyer TATTOO INJECTION  10/07/2019   Procedure: SUBMUCOSAL TATTOO INJECTION;  Surgeon: Lavena Bullion, DO;  Location: [facility];  Service: Gastroenterology;;    Assessment & Plan Clinical Impression: Patient is a 64 year old male with history of HTN, wide-complex tachycardia/AF, T2DM with neuropathy, hematochezia with colonoscopy 06/09/2021positive for rectal adenocarcinoma who was admitted via our H on 10/08/2019 with dizziness, several day history of weakness followed by LLE weakness and difficulty walking. He was found to have A. fib with RVR and CT of head done revealing right PCA infarct with petechial hemorrhage. CTA head/neck showed normal intracranial and cervical arteries. Patient had been off Eliquis for a month and on DAPT due to bloody stools. Dr. Leonie Man felt that stroke was embolic due to A. fib with question hypercoagulopathy with new diagnosis of cancer. DAPT was held due to petechial hemorrhage and low-dose ASA resumed  on 06/11.  CT chest, abdomen, pelvis done for staging and showed retroperitoneal and upper pelvic lymphadenopathy, hyperenhancing focus left hepatic lobe with surrounding parenchymal attenuation felt to be hemangioma and mild distal circumferential esophageal thickening. MRI pelvis showed rectal adenocarcinoma T4N2 with tumor 4 cm  distance from anal sphincter. Dr. Marin Olp was consulted for input on new diagnosis of rectal cancer in work-up revealed elevated CEA and tumor markers pending. Patient wants to hold off on Port-A-Cath until after his July 4 beach trip. MRI liver done and was negative for metastatic disease. Dr. Donne Hazel was consulted and recommended chemo and follow-up in office for input --patient not felt to be a candidate for surgery. He was infused with Feraheme 510 mg due to low iron stores. He did have trace amount blood in BM on 06/16. Plavix was resumed with recommendations to monitor for rectal bleeding. Patient transferred to CIR on 10/15/2019 .   Patient currently requires supervision with basic self-care skills secondary to muscle weakness, decreased cardiorespiratoy endurance, decreased coordination and control in LLE.  Prior to hospitalization, patient could complete BADLs, IADLs, and leisure activities/hobbies with independent .  Patient will benefit from skilled intervention to decrease level of assist with basic self-care skills, increase independence with basic self-care skills and increase level of independence with iADL prior to discharge home with care partner/family including daughters and girlfriend.  Anticipate patient will require intermittent supervision and follow up outpatient.  OT - End of Session Activity Tolerance: Tolerates 10 - 20 min activity with multiple rests Endurance Deficit: Yes OT Assessment OT Barriers to Discharge: Medical stability OT Patient demonstrates impairments in the following area(s): Balance;Endurance;Motor OT Basic ADL's Functional Problem(s): Bathing;Dressing;Toileting OT Advanced ADL's Functional Problem(s): Simple Meal Preparation;Light Housekeeping;Laundry OT Transfers Functional Problem(s): Tub/Shower OT Plan OT Intensity: Minimum of 1-2 x/day, 45 to 90 minutes OT Frequency: 5 out of 7 days OT Duration/Estimated Length of Stay: 7-10 days OT  Treatment/Interventions: Balance/vestibular training;Discharge planning;Self Care/advanced ADL retraining;Therapeutic Activities;UE/LE Coordination activities;Therapeutic Exercise;Patient/family education;Functional mobility training;Disease mangement/prevention;Community reintegration;DME/adaptive equipment instruction;Neuromuscular re-education;Psychosocial support OT Self Feeding Anticipated Outcome(s): Indep OT Basic Self-Care Anticipated Outcome(s): Mod I - Supervision OT Toileting Anticipated Outcome(s): Mod I OT Bathroom Transfers Anticipated Outcome(s): Mod I OT Recommendation Patient destination: Home Follow Up Recommendations: Outpatient OT Equipment Recommended: To be determined Equipment Details: likely TTB d/t pt has tub shower at home   Skilled Therapeutic Intervention Pt greeted at time of session supine in bed agreeable to OT eval and session. Pt is very pleasant and cooperative. OT explained role and purpose of OT, POC, and ELOS. Bed mobility tasks with supervision to transition to EOB, ambulated to bathroom with RW with decreased coordination in LLE, performed toileting 3/3 tasks. Pt ambulated short distance to shower and transferred to TTB, performed UB/LB bathing with standing to wash periarea without LOB or difficulty reaching, able to use BUEs for functional tasks, no clear deficits noted in LUE. Supervision-CGA throughout entire toileting and bathing routine with pt showing good safety awareness but does require occasional cues for proper use of RW since this is new to him. Pt ambulated back to his room and performed UB/LB dressing with set up for UB and CGA-Supervision for LB. Educated on figure 4 technique if needed but pt able to thread and don over hips in standing with RW for support. Note that the pt was CGA-Supervision throughout all transfers and ADL tasks during OT eval. No clear visual deficits noted and pt able to attend to L side throughout. Pt returned  to bed in  supine with supervision, call bell in reach and alarm on, all needs met.   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, driving, and independence in all ADLs ADL ADL Grooming: Supervision/safety Upper Body Bathing: Supervision/safety Where Assessed-Upper Body Bathing: Shower Lower Body Bathing: Supervision/safety Where Assessed-Lower Body Bathing: Shower Upper Body Dressing: Setup Where Assessed-Upper Body Dressing: Edge of bed Lower Body Dressing: Supervision/safety Where Assessed-Lower Body Dressing: Edge of bed Toileting: Supervision/safety Where Assessed-Toileting: Teacher, adult education: Close supervision Toilet Transfer Method: Insurance claims handler: Close supervison Web designer Method: Ship broker: Emergency planning/management officer ADL Comments: CGA-Supervision for all ADLs Vision Baseline Vision/History: Wears glasses Wears Glasses: Reading only Patient Visual Report: Peripheral vision impairment (but WFL) Vision Assessment?: Yes Eye Alignment: Within Functional Limits Ocular Range of Motion: Within Functional  Limits Tracking/Visual Pursuits: Able to track stimulus in all quads without difficulty Visual Fields: No apparent deficits Perception  Perception: Within Functional Limits Praxis Praxis: Intact Cognition Overall Cognitive Status: Within Functional Limits for tasks assessed Arousal/Alertness: Awake/alert Year: 2021 Month: June Day of Week: Correct Memory: Appears intact Immediate Memory Recall: Sock;Blue;Bed Memory Recall Sock: Without Cue Memory Recall Blue: Without Cue Memory Recall Bed: Without Cue Attention: Alternating Selective Attention: Appears intact Awareness: Appears intact Problem Solving: Appears intact Problem Solving Impairment: Functional complex Executive Function: Sequencing;Self Correcting Sequencing: Appears intact Self Correcting: Appears intact Safety/Judgment: Appears intact Sensation Sensation Light Touch: Appears Intact Coordination Gross Motor Movements are Fluid and Coordinated: No Fine Motor Movements are Fluid and Coordinated: Yes (WFL, plan to further test and assess) Heel Shin Test: Decreased coordination when dragging LLE up RLE. Motor  Motor Motor: Hemiplegia Motor - Skilled Clinical Observations: LLE weakness Mobility  Transfers Sit to Stand: Supervision/Verbal cueing Stand to Sit: Supervision/Verbal cueing  Trunk/Postural Assessment  Cervical Assessment Cervical Assessment: Within Functional Limits Thoracic Assessment Thoracic Assessment: Exceptions to [facility] (slightly rounded shoulders) Postural Control Postural Control: Within Functional Limits  Balance Balance Balance Assessed: Yes Standardized Balance Assessment Standardized Balance Assessment: Berg Balance Test Berg Balance Test Sit to Stand: Able to stand  independently using hands Standing Unsupported: Able to stand 30 seconds unsupported Sitting with Back Unsupported but Feet Supported on Floor or Stool: Able to sit safely and securely 2 minutes Stand to Sit: Controls  descent by using hands Transfers: Able to transfer safely, definite need of hands Standing Unsupported with Eyes Closed: Able to stand 10 seconds with supervision Standing Ubsupported with Feet Together: Needs help to attain position and unable to hold for 15 seconds From Standing, Reach Forward with Outstretched Arm: Can reach forward >5 cm safely (2") From Standing Position, Pick up Object from Floor: Able to pick up shoe, needs supervision From Standing Position, Turn to Look Behind Over each Shoulder: Looks behind one side only/other side shows less weight shift Turn 360 Degrees: Needs close supervision or verbal cueing  Standing Unsupported, Alternately Place Feet on Step/Stool: Needs assistance to keep from falling or unable to try Standing Unsupported, One Foot in Front: Loses balance while stepping or standing Standing on One Leg: Unable to try or needs assist to prevent fall Total Score: 27 Extremity/Trunk Assessment RUE Assessment RUE Assessment: Within Functional Limits       Refer to Care Plan for Long Term Goals  Recommendations for other services: None    Discharge Criteria: Patient will be discharged from OT if patient refuses treatment 3 consecutive times without medical reason, if treatment goals not met, if there is a change in medical status, if patient makes no progress towards goals or if patient is discharged from hospital.  The above assessment, treatment plan, treatment alternatives and goals were discussed and mutually agreed upon: by patient  Viona Gilmore 10/16/2019, 12:58 PM

## 2019-10-16 NOTE — Care Management Important Message (Signed)
Important Message  Patient Details  Name: John Douglas MRN: [id number] Date of Birth: March 08, 1956   Medicare Important Message Given:  Yes  Patient left prior to IM delivery.  IM mailed to the patient home.    Iris Bratton 10/16/2019, 1:45 PM

## 2019-10-16 NOTE — Evaluation (Signed)
Physical Therapy Assessment and Plan  Patient Details  Name: John Douglas MRN: [id number] Date of Birth: Apr 24, 1956  PT Diagnosis: Abnormality of gait, Hemiplegia non-dominant and Muscle weakness Rehab Potential: Excellent ELOS: 7-10 days   Today's Date: 10/16/2019 PT Individual Time: 3832-9191 PT Individual Time Calculation (min): 56 min    Problem List:  Patient Active Problem List   Diagnosis Date Noted  . Acute ischemic right PCA stroke ([place]) 10/15/2019  . Ischemic stroke ([place]) 10/08/2019  . Leukocytosis   . Hypokalemia   . Colon cancer ([place])   . Change in bowel habits   . Rectal bleeding   . Rectal mass   . Rectal polyp   . Polyp of descending colon   . Diverticulosis of colon without hemorrhage   . Gastrointestinal hemorrhage 08/29/2019  . Coronary artery disease 08/29/2019  . Ischemic cardiomyopathy 08/29/2019  . Paroxysmal atrial fibrillation ([facility])   . Essential hypertension   . Hyperlipidemia LDL goal <70   . Non-ST elevation (NSTEMI) myocardial infarction ([place])   . Ventricular tachycardia ([place]) 06/04/2019    Past Medical History:  Past Medical History:  Diagnosis Date  . Allergic rhinitis    [facility]  . Anxiety   . Atrial fibrillation ([place])    [facility]  . Benign essential hypertension    [facility]  . Coronary artery disease    [facility]  . Diabetic neuropathy, type II diabetes mellitus ([place])    [facility]  . Mixed hyperlipidemia    [facility]  . Myocardial infarct [facility])    [facility]  . Obesity, unspecified    [facility]  . Rectal bleeding     [facility]  . Wide-complex tachycardia ([place]) 06/04/2019   Past Surgical History:  Past Surgical History:  Procedure Laterality Date  . BIOPSY  10/07/2019   Procedure: BIOPSY;  Surgeon: Lavena Bullion, DO;   Location: [facility];  Service: Gastroenterology;;  . COLONOSCOPY WITH PROPOFOL N/A 10/07/2019   Procedure: COLONOSCOPY WITH PROPOFOL;  Surgeon: Lavena Bullion, DO;  Location: [facility];  Service: Gastroenterology;  Laterality: N/A;  . CORONARY ANGIOPLASTY     3 stents  . LEFT HEART CATH AND CORONARY ANGIOGRAPHY N/A 06/05/2019   Procedure: LEFT HEART CATH AND CORONARY ANGIOGRAPHY;  Surgeon: Nelva Bush, MD;  Location: [facility];  Service: Cardiovascular;  Laterality: N/A;  . POLYPECTOMY  10/07/2019   Procedure: POLYPECTOMY;  Surgeon: Lavena Bullion, DO;  Location: [facility];  Service: Gastroenterology;;  . Lia Foyer TATTOO INJECTION  10/07/2019   Procedure: SUBMUCOSAL TATTOO INJECTION;  Surgeon: Lavena Bullion, DO;  Location: [facility];  Service: Gastroenterology;;    Assessment & Plan Clinical Impression: Patient is a 64 year old male with history of HTN, wide-complex tachycardia/AF, T2DM with neuropathy, hematochezia with colonoscopy 06/09/2021positive for rectal adenocarcinoma who was admitted via our H on 10/08/2019 with dizziness, several day history of weakness followed by LLE weakness and difficulty walking.  He was found to have A. fib with RVR and CT of head done revealing right PCA infarct with petechial hemorrhage.  CTA head/neck showed normal intracranial and cervical arteries.  Patient had been off Eliquis for a month and on DAPT due to bloody stools.  Dr. Leonie Man felt that stroke was embolic due to A. fib with question hypercoagulopathy with new diagnosis of cancer.  DAPT was held due to petechial hemorrhage  and low-dose ASA resumed on 06/11.  CT chest, abdomen, pelvis done for staging and showed retroperitoneal and upper pelvic lymphadenopathy, hyperenhancing focus left hepatic lobe with surrounding parenchymal attenuation felt to be hemangioma and mild distal circumferential esophageal thickening.  MRI pelvis showed rectal adenocarcinoma T4N2 with tumor 4  cm distance from anal sphincter.  Dr. Marin Olp was consulted for input on new diagnosis of rectal cancer in work-up revealed elevated CEA and tumor markers pending.  Patient wants to hold off on Port-A-Cath until after his July 4 beach trip.  MRI liver done and was negative for metastatic disease.  Dr. Donne Hazel  was consulted and recommended chemo and follow-up in office for input --patient not felt to be a candidate for surgery.  He was infused with Feraheme 510 mg due to low iron stores.  He did have trace amount blood in BM on 06/16.  Plavix was resumed with recommendations to monitor for rectal bleeding. Patient transferred to CIR on 10/15/2019 .   Patient currently requires min with mobility secondary to muscle weakness, ataxia and decreased coordination and decreased standing balance, hemiplegia and decreased balance strategies.  Prior to hospitalization, patient was independent  with mobility and lived with Alone in a House home.  Home access is 1  Stairs to enter.  Patient will benefit from skilled PT intervention to maximize safe functional mobility and minimize fall risk for planned discharge home with intermittent assist.  Anticipate patient will benefit from follow up OP at discharge.     Skilled Therapeutic Intervention  Evaluation completed (see details above and below) with education on PT POC and goals and individual treatment initiated with focus on transfers, balance, and ambulation without an AD.  Pt received seated on toilet and agreeable to therapy. No report of pain. Sit to stand from toilet with CGA and RW. Pt ambulates 50' to gym with RW and supervision from PT, with cues for upright gaze and safe RW management. Pt performs car transfer with supervision for hand placement. Pt progresses to ambulating without AD, requiring minA at hips and demonstrating slightly ataxic movements with LLE. Pt ambulates bouts of 50', 150', and 150'. LLE stride length shortened and with increased  postural sway. Pt also using LUE to brace against PT for support. Pt performs BERG balance assessment, as detailed below, with score of 27/56, indicating high risk for falls. Pt performs bed mobility independently. Left seated at EOB with alarm intact and all needs within reach.   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, driving, and independence in all ADLs Vision/Perception  Vision - Assessment Eye Alignment: Within Functional Limits Ocular Range of Motion: Within Functional Limits Tracking/Visual Pursuits: Able to track stimulus in all quads without difficulty Perception Perception: Within Functional Limits Praxis Praxis: Intact  Cognition Overall Cognitive Status: Within Functional Limits for tasks assessed Arousal/Alertness: Awake/alert Attention: Alternating Selective Attention: Appears intact Memory: Appears intact Immediate Memory Recall: Sock;Blue;Bed Memory Recall Sock: Without Cue Memory Recall Blue: Without Cue Memory Recall Bed:  Without Cue Awareness: Appears intact Problem Solving: Appears intact Problem Solving Impairment: Functional complex Sequencing: Appears intact Safety/Judgment: Appears intact Sensation Sensation Light Touch: Appears  Intact Coordination Gross Motor Movements are Fluid and Coordinated: No Heel Shin Test: Decreased coordination when dragging LLE up RLE. Motor  Motor Motor: Hemiplegia Motor - Skilled Clinical Observations: LLE weakness  Mobility Bed Mobility Bed Mobility: Supine to Sit;Sit to Supine Supine to Sit: Independent Sit to Supine: Independent Transfers Transfers: Sit to Stand;Stand to Sit;Stand Pivot Transfers Sit to Stand: Supervision/Verbal cueing Stand to Sit: Supervision/Verbal cueing Stand Pivot Transfers: Contact Guard/Touching assist Transfer (Assistive device): Rolling walker Locomotion  Gait Ambulation: Yes Gait Assistance: Minimal Assistance - Patient > 75% Gait Distance (Feet): 150 Feet Assistive device: None Gait Assistance Details: Verbal cues for gait pattern;Verbal cues for technique;Tactile cues for weight shifting Gait Gait: Yes Gait Pattern: Impaired Gait Pattern: Wide base of support;Decreased stride length Gait velocity: Decreased Stairs / Additional Locomotion Stairs: Yes Stairs Assistance: Contact Guard/Touching assist Stair Management Technique: Two rails Number of Stairs: 12 Height of Stairs: 6 Ramp: Minimal Assistance - Patient >75% Curb: Nurse, mental health Mobility: No  Trunk/Postural Assessment  Cervical Assessment Cervical Assessment: Within Functional Limits Thoracic Assessment Thoracic Assessment: Exceptions to [facility] (rounded shoulders) Lumbar Assessment Lumbar Assessment: Within Functional Limits Postural Control Postural Control: Within Functional Limits  Balance Balance Balance Assessed: Yes Standardized Balance Assessment Standardized Balance Assessment: Berg Balance Test Berg Balance Test Sit to Stand: Able to stand  independently using hands Standing Unsupported: Able to stand 30 seconds unsupported Sitting with Back Unsupported but Feet Supported on Floor or Stool: Able to sit safely and  securely 2 minutes Stand to Sit: Controls descent by using hands Transfers: Able to transfer safely, definite need of hands Standing Unsupported with Eyes Closed: Able to stand 10 seconds with supervision Standing Ubsupported with Feet Together: Needs help to attain position and unable to hold for 15 seconds From Standing, Reach Forward with Outstretched Arm: Can reach forward >5 cm safely (2") From Standing Position, Pick up Object from Floor: Able to pick up shoe, needs supervision From Standing Position, Turn to Look Behind Over each Shoulder: Looks behind one side only/other side shows less weight shift Turn 360 Degrees: Needs close supervision or verbal cueing Standing Unsupported, Alternately Place Feet on Step/Stool: Needs assistance to keep from falling or unable to try Standing Unsupported, One Foot in Front: Loses balance while stepping or standing Standing on One Leg: Unable to try or needs assist to prevent fall Total Score: 27 Static Sitting Balance Static Sitting - Level of Assistance: 7: Independent Dynamic Sitting Balance Dynamic Sitting - Level of Assistance: 7: Independent Static Standing Balance Static Standing - Level of Assistance: 4: Min assist Dynamic Standing Balance Dynamic Standing - Level of Assistance: 4: Min assist Extremity Assessment  RLE Assessment RLE Assessment: Within Functional Limits General Strength Comments: 5/5 LLE Assessment LLE Assessment: Exceptions to [facility] General Strength Comments: Grossly 3+/5    Refer to Care Plan for Long Term Goals  Recommendations for other services: None   Discharge Criteria: Patient will be discharged from PT if patient refuses treatment 3 consecutive times without medical reason, if treatment goals not met, if there is a change in medical status, if patient makes no progress towards goals or if patient is discharged from hospital.  The above assessment, treatment plan, treatment alternatives and goals were  discussed and mutually agreed upon: by patient and by family  Breck Coons,  PT, DPT 10/16/2019, 5:52 PM

## 2019-10-16 NOTE — Progress Notes (Signed)
Inpatient Rehabilitation  Patient information reviewed and entered into eRehab system by Guenther Dunshee M. Refujio Haymer, M.A., CCC/SLP, PPS Coordinator.  Information including medical coding, functional ability and quality indicators will be reviewed and updated through discharge.    

## 2019-10-17 ENCOUNTER — Inpatient Hospital Stay (HOSPITAL_COMMUNITY): Payer: Medicare HMO | Admitting: Occupational Therapy

## 2019-10-17 ENCOUNTER — Inpatient Hospital Stay (HOSPITAL_COMMUNITY): Payer: Medicare HMO

## 2019-10-17 LAB — CBC WITH DIFFERENTIAL/PLATELET
Abs Immature Granulocytes: 0.05 10*3/uL (ref 0.00–0.07)
Basophils Absolute: 0.1 10*3/uL (ref 0.0–0.1)
Basophils Relative: 1 %
Eosinophils Absolute: 0.2 10*3/uL (ref 0.0–0.5)
Eosinophils Relative: 2 %
HCT: 39.7 % (ref 39.0–52.0)
Hemoglobin: 13 g/dL (ref 13.0–17.0)
Immature Granulocytes: 1 %
Lymphocytes Relative: 17 %
Lymphs Abs: 1.7 10*3/uL (ref 0.7–4.0)
MCH: 27.2 pg (ref 26.0–34.0)
MCHC: 32.7 g/dL (ref 30.0–36.0)
MCV: 83.1 fL (ref 80.0–100.0)
Monocytes Absolute: 0.7 10*3/uL (ref 0.1–1.0)
Monocytes Relative: 7 %
Neutro Abs: 7.2 10*3/uL (ref 1.7–7.7)
Neutrophils Relative %: 72 %
Platelets: 282 10*3/uL (ref 150–400)
RBC: 4.78 MIL/uL (ref 4.22–5.81)
RDW: 16 % — ABNORMAL HIGH (ref 11.5–15.5)
WBC: 9.8 10*3/uL (ref 4.0–10.5)
nRBC: 0 % (ref 0.0–0.2)

## 2019-10-17 LAB — GLUCOSE, CAPILLARY
Glucose-Capillary: 137 mg/dL — ABNORMAL HIGH (ref 70–99)
Glucose-Capillary: 133 mg/dL — ABNORMAL HIGH (ref 70–99)
Glucose-Capillary: 171 mg/dL — ABNORMAL HIGH (ref 70–99)
Glucose-Capillary: 142 mg/dL — ABNORMAL HIGH (ref 70–99)

## 2019-10-17 MED ORDER — CLOPIDOGREL BISULFATE 75 MG PO TABS
75.0000 mg | ORAL_TABLET | Freq: Every day | ORAL | Status: DC
  Administered 2019-10-18 – 2019-10-23 (×6): 75 mg via ORAL
  Filled 2019-10-17 (×6): qty 1

## 2019-10-17 NOTE — Progress Notes (Signed)
Occupational Therapy Session Note  Patient Details  Name: John Douglas MRN: [id number] Date of Birth: [birth year]  Today's Date: 10/17/2019 OT Individual Time: 1002-1045 OT Individual Time Calculation (min): 43 min   Short Term Goals: Week 1:  OT Short Term Goal 1 (Week 1): STGs = LTGs d/t short ELOS  Skilled Therapeutic Interventions/Progress Updates:    Pt greeted semi-reclined in bed and agreeable to OT treatment session. Pt agreeable to shower today. Pt ambulated into bathroom w/ RW and CGA. Verbal cues for safety awareness to sit down to step out of pants. Bathing completed with overall set-up/supervision and verbal cues for thoroughness. Pt ambulated out of shower in similar fashion. Pt sat EOB for dressing, but needed min verbal cues to initiate task. CGA for standing balance when standing to pull up pants. Pt then ambulated to the sink with education provided for RW management and positioning. Pt stood for toothbrushing task with close supervision. Pt requested to return to bed at end of session and left semi-reclined in bed with bed alarm on, call bell in reach, and needs met.   Therapy Documentation Precautions:  Precautions Precautions: Fall Restrictions Weight Bearing Restrictions: No Pain:   denies pain  Therapy/Group: Individual Therapy  Valma Cava 10/17/2019, 10:19 AM

## 2019-10-17 NOTE — Progress Notes (Signed)
Physical Therapy Session Note  Patient Details  Name: John Douglas MRN: [id number] Date of Birth: [birth year]  Today's Date: 10/17/2019 PT Individual Time: 0800-0900 and 1400 to 1515 PT Individual Time Calculation (min): 60 min and 75 min  Short Term Goals: Week 1:  PT Short Term Goal 1 (Week 1): STGs = LTGS due to ELOS Week 2:    Week 3:     Skilled Therapeutic Interventions/Progress Updates:  Session One:   PAIN denies pain  Pt initially supine and agreeable to treatment session w/focus on dynamic balance training activities. Supine to sit w/supervision. SPT bed to wc w/min assist for balance. wc propulsion 146ft w/bilat LEs and ocasional cues for attention to LLE.  Performed as bipedal training and LE strengthening.  STS w/cga and Gait 144ft w/RW w/cga wide base, decreased stance time LLE.  wc propulsion 3ft w/bilat LEs to mat in gym.  SPT to mat w/Min assist  Pt educated w/use of maxiski for balance training and agreeable to trying this.   Harness donned by therapist.  Pt performed the following balance tasks in maxisky: Gait 26ft x 6 passes for training in device.  Cues to relax UEs and for armswing, cues to decrease base of support, overall decreased cadence.  Sidestepping - increased difficulty SS to R side, 4 passes eACH/30FT EACH Backing - difficult 33ft x 2 Turning 360 - moderate, repeated x 2 Heel raises - mild difficulty x 20 Marching - mild difficulty x 20  Ball toss w/increasing challenges to limits of stability in all directions.  Occasional lob post and to L w/higher level challenges, delayed reactions. Performed 2 sets of 5 min.  Pt toleratred 45 min balance training w/mult rest breaks provided. Harness removed by therapist in siitng SPT mat to wc w/min assist  wc propulsion >141ft to room w/supervision and cues to attend to LLE but improved from earlier in session.  SPT wc to bed w/cues for safe set up of wc, min assist for balance.  Sit to supine  w/supervision. Pt left supine w/rails up x 3, alarm set, bed in lowest position, and needs in reach.  Session Two: PAIN Denies pain  Pt initially supine and agreeable to treatment.  Supine to sit w/supervision.  SPT to wc w/min assist wc propulsion x 13ft w/bilat LEs for hs strengthening. STS and Gait x 376ft w/RW and cga, cues to increase clearance/heelstrike on L, mild wobbles w/extension thrust at loading w/fatigue.  Step ups LLE on 5in step w/rails for balance. Repeated x 15 for quad strengthening/knee control.  Alternating tapping 5in step initially w/bilat ue support w/cga, progressed to single UE support w/cga, progressed to no UE support.w/cga to mod assist occasionally due to balance loss.  Standing LLE coordination challenge - standing in parallel bars w/UE support pt instructed to lightly tap top of 6in cones placed in varying patterns including forward line, crossing mildline orientation, and to L side,  using toes - challenging activity due to dysmetria/ataxic movement.  Gait >462ft w/cga, deviations as above.  Balance: Static stand w/feet together w/cga, increased sway, single min assist due to excessive sway W/eyes closed - much increased sway - cga to min-mod assist for balance, can maintain up to 15sec w/cga  SPT to wc min assist.  WC propulsion x 68ft w/bilat LEs w/supervision. Short distance gait in room to commode and commode transfer w/cga using RW.  Pt continent of bowels and independent w/hygiene.   Gait commode to bed w/RW and cga, turn/sit to bed w/cues  for safety.  Sit to supine w/supervision. Pt left supine w/rails up x 3, alarm set, bed in lowest position, and needs in reach.   Therapy Documentation Precautions:  Precautions Precautions: Fall Restrictions Weight Bearing Restrictions: No    Therapy/Group: Individual Therapy  Callie Fielding, [place] 10/17/2019, 7:34 AM

## 2019-10-17 NOTE — Progress Notes (Signed)
John Douglas PHYSICAL MEDICINE & REHABILITATION PROGRESS NOTE   Subjective/Complaints: Overnight RN called to report bloody stools. I stopped Plavix. Patient reports less bloody stools today. Hgb remains stable at 12.0. WBC has normalized.   ROS: Denies pain, constipation, insomnia, muscle cramps.   Objective:   No results found. Recent Labs    10/16/19 0528 10/17/19 0524  WBC 11.1* 9.8  HGB 13.3 13.0  HCT 40.0 39.7  PLT 284 282   Recent Labs    10/16/19 0528  NA 138  K 3.7  CL 104  CO2 25  GLUCOSE 146*  BUN 15  CREATININE 1.08  CALCIUM 9.1    Intake/Output Summary (Last 24 hours) at 10/17/2019 1632 Last data filed at 10/17/2019 1400 Gross per 24 hour  Intake 700 ml  Output --  Net 700 ml     Physical Exam: Vital Signs Blood pressure (!) 118/55, pulse 62, temperature 98.7 F (37.1 C), resp. rate 14, height 5\' 10"  (1.778 m), weight 94.8 kg, SpO2 97 %. General: Alert and oriented x 3, No apparent distress HEENT: Head is normocephalic, atraumatic, PERRLA, EOMI, sclera anicteric, oral mucosa pink and moist, dentition intact, ext ear canals clear,  Neck: Supple without JVD or lymphadenopathy Heart: Reg rate and rhythm. No murmurs rubs or gallops Chest: CTA bilaterally without wheezes, rales, or rhonchi; no distress Abdomen: Soft, non-tender, non-distended, bowel sounds positive. Extremities: No clubbing, cyanosis, or edema. Pulses are 2+ Skin: Clean and intact without signs of breakdown Neuro: Alert and oriented x3. Impaired delayed recall. Cranial nerves 2-12 are intact. Motor function is grossly 5/5 on right side, 4+/5 left upper extremity and 4-/5 in left lower extremity with ataxia. Performed sit to stand with MinA  Psych: Pt's affect is appropriate. Pt is cooperative   Assessment/Plan: 1. Functional deficits secondary to acute ischemic left PCA stroke which require 3+ hours per day of interdisciplinary therapy in a comprehensive inpatient rehab  setting.  Physiatrist is providing close team supervision and 24 hour management of active medical problems listed below.  Physiatrist and rehab team continue to assess barriers to discharge/monitor patient progress toward functional and medical goals  Care Tool:  Bathing    Body parts bathed by patient: Right arm, Left arm, Chest, Abdomen, Front perineal area, Buttocks, Right upper leg, Left upper leg, Right lower leg, Left lower leg, Face         Bathing assist Assist Level: Contact Guard/Touching assist     Upper Body Dressing/Undressing Upper body dressing   What is the patient wearing?: Hospital gown only    Upper body assist Assist Level: Set up assist    Lower Body Dressing/Undressing Lower body dressing      What is the patient wearing?: Underwear/pull up, Pants     Lower body assist Assist for lower body dressing: Contact Guard/Touching assist     Toileting Toileting    Toileting assist Assist for toileting: Supervision/Verbal cueing     Transfers Chair/bed transfer  Transfers assist     Chair/bed transfer assist level: Minimal Assistance - Patient > 75%     Locomotion Ambulation   Ambulation assist      Assist level: Contact Guard/Touching assist Assistive device: Walker-rolling Max distance: 60   Walk 10 feet activity   Assist     Assist level: Contact Guard/Touching assist Assistive device: Walker-rolling   Walk 50 feet activity   Assist    Assist level: Contact Guard/Touching assist Assistive device: Walker-rolling    Walk 150 feet activity  Assist    Assist level: Minimal Assistance - Patient > 75% Assistive device: No Device    Walk 10 feet on uneven surface  activity   Assist     Assist level: Minimal Assistance - Patient > 75%     Wheelchair     Assist Will patient use wheelchair at discharge?: No             Wheelchair 50 feet with 2 turns activity    Assist             Wheelchair 150 feet activity     Assist          Blood pressure (!) 118/55, pulse 62, temperature 98.7 F (37.1 C), resp. rate 14, height 5\' 10"  (1.778 m), weight 94.8 kg, SpO2 97 %.    Medical Problem List and Plan: 1.  Impaired mobility and ADLs secondary to acute ischemic right PCA stroke.             -patient may shower             -ELOS/Goals: 7 days  -Continue CIR 2.  Antithrombotics: -DVT/anticoagulation:  Mechanical: Sequential compression devices, below knee Bilateral lower extremities and TEDs             -antiplatelet therapy: See#12 3. Pain Management: Will add low dose gabapentin and change robaxin to baclofen prn  muscle spasms. Currently well controlled 4. Mood: LCSW to follow for evaluation and support.              -antipsychotic agents: N/A 5. Neuropsych: This patient is capable of making decisions on his own behalf. 6. Skin/Wound Care: Routine pressure relief measures. 7. Fluids/Electrolytes/Nutrition: Monitor intake/output.  Offer supplements as needed Po intake 8.  CAD s/p PCI to RCA 12/20: On DAPT and Crestor.  9.  T2DM: Hemoglobin A1c 8.0.  Blood sugars ac/hs--has been off meds X 1 year due to improvement in A1c-5.4 (loss weight and dieting).    10.  Hematochezia,/iron deficiency anemia: Stable 11.  Hypokalemia: Stable 12.  Stage IV colon cancer: Monitor for signs of bleeding is now back on DAPT.  6/19: had large amount of bloody stools. Held plavix. CBC repeated today and Hgb stable. Had small bloody stools today. Continue to monitor.  13.  PAF: Monitor heart rate 3 times daily.  Continue metoprolol twice daily. Currently well controlled.  14.  HTN: Monitor blood pressures 3 times daily.  Continue low-dose lisinopril as well as metoprolol. Labile: continue to monitor.  15. Situational anxiety: Would like to be started on something. Will start Celexa. Continue Klonopin prn has being effective 16. Insomnia: slept better last night.  17. Leukocytosis:  normalized.   LOS: 2 days A FACE TO FACE EVALUATION WAS PERFORMED  [name] [name] 10/17/2019, 4:32 PM

## 2019-10-18 LAB — GLUCOSE, CAPILLARY
Glucose-Capillary: 132 mg/dL — ABNORMAL HIGH (ref 70–99)
Glucose-Capillary: 162 mg/dL — ABNORMAL HIGH (ref 70–99)
Glucose-Capillary: 160 mg/dL — ABNORMAL HIGH (ref 70–99)
Glucose-Capillary: 228 mg/dL — ABNORMAL HIGH (ref 70–99)
Glucose-Capillary: 236 mg/dL — ABNORMAL HIGH (ref 70–99)

## 2019-10-18 NOTE — IPOC Note (Signed)
Overall Plan of Care [facility]) Patient Details Name: John Douglas MRN: [id number] DOB: 09-23-1955  Admitting Diagnosis: Acute ischemic right PCA stroke [facility])  Hospital Problems: Principal Problem:   Acute ischemic right PCA stroke ([place])     Functional Problem List: Nursing Endurance, Medication Management  PT Balance, Endurance, Motor, Pain  OT Balance, Endurance, Motor  SLP    TR         Basic ADL's: OT Bathing, Dressing, Toileting     Advanced  ADL's: OT Simple Meal Preparation, Light Housekeeping, Laundry     Transfers: PT Bed Mobility, Bed to Chair, Administrator, arts     Locomotion: PT Ambulation, Stairs     Additional Impairments: OT    SLP        TR      Anticipated Outcomes Item Anticipated Outcome  Self Feeding Indep  Swallowing      Basic self-care  Mod I - Supervision  Toileting  Mod I   Bathroom Transfers Mod I  Bowel/Bladder  mod i  Transfers  independent  Locomotion  mod(I) to (I)  Communication     Cognition     Pain  less than 2  Safety/Judgment  mod i   Therapy Plan: PT Intensity: Minimum of 1-2 x/day ,45 to 90 minutes PT Frequency: 5 out of 7 days PT Duration Estimated Length of Stay: 7-10 days OT Intensity: Minimum of 1-2 x/day, 45 to 90 minutes OT Frequency: 5 out of 7 days OT Duration/Estimated Length of Stay: 7-10 days SLP Duration/Estimated Length of Stay: n/a for ST   Due to the current state of emergency, patients may not be receiving their 3-hours of Medicare-mandated therapy.   Team Interventions: Nursing Interventions Patient/Family Education  PT interventions Ambulation/gait training, Community reintegration, DME/adaptive equipment instruction, Neuromuscular re-education, Psychosocial support, Stair training, UE/LE Strength taining/ROM, Training and development officer, Discharge planning, Functional electrical stimulation, Pain management, Therapeutic Activities, UE/LE Coordination activities, Disease  management/prevention, Functional mobility training, Patient/family education, Splinting/orthotics, Therapeutic Exercise, Visual/perceptual remediation/compensation  OT Interventions Balance/vestibular training, Discharge planning, Self Care/advanced ADL retraining, Therapeutic Activities, UE/LE Coordination activities, Therapeutic Exercise, Patient/family education, Functional mobility training, Disease mangement/prevention, Community reintegration, Engineer, drilling, Neuromuscular re-education, Psychosocial support  SLP Interventions    TR Interventions    SW/CM Interventions Discharge Planning, Psychosocial Support, Patient/Family Education   Barriers to Discharge MD  Medical stability  Nursing Medication compliance    PT      OT Medical stability    SLP      SW       Team Discharge Planning: Destination: PT-Home ,OT- Home , SLP-Home Projected Follow-up: PT-Outpatient PT, OT-  Outpatient OT, SLP-None Projected Equipment Needs: PT-To be determined, OT- To be determined, SLP-None recommended by SLP Equipment Details: PT- , OT-likely TTB d/t pt has tub shower at home Patient/family involved in discharge planning: PT- Patient, Family member/caregiver,  OT-Patient, SLP-Patient  MD ELOS: 7 days Medical Rehab Prognosis:  Excellent Assessment: [name] is a 64 year old man who is admitted to CIR for impaired mobility and ADLssecondary to acute ischemic right PCA stroke. Medical issues include hematochezia which required holding of Plavix of 1 day. Hgb has been stable. Pain has been well controlled with Gabapentin and Baclofen. CBGs have been well controlled. Heart rate has been monitored closely given atrial fibrillation. Metoprolol has been continued. Celexa and Klonopin have been started for situational anxiety. He had leukocytosis on admission which has since normalized.    See Team Conference Notes for weekly updates  to the plan of care

## 2019-10-18 NOTE — Progress Notes (Signed)
[place] PHYSICAL MEDICINE & REHABILITATION PROGRESS NOTE   Subjective/Complaints: John Douglas and daughter were upset yesterday that Plavix were stopped and felt he had not been told about this. I did tell patient about this yesterday morning- Plavix was stopped due to bloody stools on Friday night. Discussed with RN that there have been no more bloody stools and have restarted Plavix.   ROS: Denies pain, constipation, insomnia, muscle cramps.   Objective:   No results found. Recent Labs    10/16/19 0528 10/17/19 0524  WBC 11.1* 9.8  HGB 13.3 13.0  HCT 40.0 39.7  PLT 284 282   Recent Labs    10/16/19 0528  NA 138  K 3.7  CL 104  CO2 25  GLUCOSE 146*  BUN 15  CREATININE 1.08  CALCIUM 9.1    Intake/Output Summary (Last 24 hours) at 10/18/2019 0917 Last data filed at 10/18/2019 0737 Gross per 24 hour  Intake 690 ml  Output --  Net 690 ml     Physical Exam: Vital Signs Blood pressure 132/82, pulse 63, temperature 97.8 F (36.6 C), resp. rate 18, height 5\' 10"  (1.778 m), weight 94.8 kg, SpO2 97 %. General: Alert and oriented x 3, No apparent distress HEENT: Head is normocephalic, atraumatic, PERRLA, EOMI, sclera anicteric, oral mucosa pink and moist, dentition intact, ext ear canals clear,  Neck: Supple without JVD or lymphadenopathy Heart: Reg rate and rhythm. No murmurs rubs or gallops Chest: CTA bilaterally without wheezes, rales, or rhonchi; no distress Abdomen: Soft, non-tender, non-distended, bowel sounds positive. Extremities: No clubbing, cyanosis, or edema. Pulses are 2+ Skin: Clean and intact without signs of breakdown Neuro: Alert and oriented x3. Impaired delayed recall. Cranial nerves 2-12 are intact. Motor function is grossly 5/5 on right side, 4+/5 left upper extremity and 4-/5 in left lower extremity with ataxia. Performed sit to stand with MinA  Psych: Pt's affect is appropriate. Pt is cooperative  Assessment/Plan: 1. Functional deficits secondary  to acute ischemic left PCA stroke which require 3+ hours per day of interdisciplinary therapy in a comprehensive inpatient rehab setting.  Physiatrist is providing close team supervision and 24 hour management of active medical problems listed below.  Physiatrist and rehab team continue to assess barriers to discharge/monitor patient progress toward functional and medical goals  Care Tool:  Bathing    Body parts bathed by patient: Right arm, Left arm, Chest, Abdomen, Front perineal area, Buttocks, Right upper leg, Left upper leg, Right lower leg, Left lower leg, Face         Bathing assist Assist Level: Contact Guard/Touching assist     Upper Body Dressing/Undressing Upper body dressing   What is the patient wearing?: Hospital gown only    Upper body assist Assist Level: Set up assist    Lower Body Dressing/Undressing Lower body dressing      What is the patient wearing?: Underwear/pull up, Pants     Lower body assist Assist for lower body dressing: Contact Guard/Touching assist     Toileting Toileting    Toileting assist Assist for toileting: Supervision/Verbal cueing (Simultaneous filing. User may not have seen previous data.)     Transfers Chair/bed transfer  Transfers assist     Chair/bed transfer assist level: Minimal Assistance - Patient > 75%     Locomotion Ambulation   Ambulation assist      Assist level: Contact Guard/Touching assist Assistive device: Walker-rolling Max distance: 350   Walk 10 feet activity   Assist  Assist level: Contact Guard/Touching assist Assistive device: Walker-rolling   Walk 50 feet activity   Assist    Assist level: Contact Guard/Touching assist Assistive device: Walker-rolling    Walk 150 feet activity   Assist    Assist level: Contact Guard/Touching assist Assistive device: Walker-rolling    Walk 10 feet on uneven surface  activity   Assist     Assist level: Minimal Assistance -  Patient > 75%     Wheelchair     Assist Will patient use wheelchair at discharge?: No             Wheelchair 50 feet with 2 turns activity    Assist            Wheelchair 150 feet activity     Assist          Blood pressure 132/82, pulse 63, temperature 97.8 F (36.6 C), resp. rate 18, height 5\' 10"  (1.778 m), weight 94.8 kg, SpO2 97 %.  Medical Problem List and Plan: 1.  Impaired mobility and ADLs secondary to acute ischemic right PCA stroke.             -patient may shower             -ELOS/Goals: 7 days modI  -Continue CIR 2.  Antithrombotics: -DVT/anticoagulation:  Mechanical: Sequential compression devices, below knee Bilateral lower extremities and TEDs             -antiplatelet therapy: Have restarted Plavix- no more hematochezia.  3. Pain Management: Will add low dose gabapentin and change robaxin to baclofen prn  muscle spasms. Currently well controlled.  4. Mood: LCSW to follow for evaluation and support.              -antipsychotic agents: N/A 5. Neuropsych: This patient is capable of making decisions on his own behalf. 6. Skin/Wound Care: Routine pressure relief measures. 7. Fluids/Electrolytes/Nutrition: Monitor intake/output.  Offer supplements as needed Po intake 8.  CAD s/p PCI to RCA 12/20: On DAPT and Crestor.  9.  T2DM: Hemoglobin A1c 8.0.  Blood sugars ac/hs--has been off meds X 1 year due to improvement in A1c-5.4 (loss weight and dieting).   CBGs have been well controlled.  10.  Hematochezia,/iron deficiency anemia: Stable 11.  Hypokalemia: Stable 12.  Stage IV colon cancer: Monitor for signs of bleeding is now back on DAPT.  6/19: had large amount of bloody stools. Held plavix. CBC repeated today and Hgb stable. Had small bloody stools today. Continue to monitor.   6/20: Plavix restarted. See #2.  13.  PAF: Monitor heart rate 3 times daily.  Continue metoprolol twice daily. Currently well controlled.  14.  HTN: Monitor blood  pressures 3 times daily.  Continue low-dose lisinopril as well as metoprolol. Labile: continue to monitor.  15. Situational anxiety: Would like to be started on something. Will start Celexa. Continue Klonopin prn has being effective 16. Insomnia: slept better last night.  17. Leukocytosis: normalized.   LOS: 3 days A FACE TO FACE EVALUATION WAS PERFORMED  John Douglas 10/18/2019, 9:17 AM

## 2019-10-19 ENCOUNTER — Inpatient Hospital Stay (HOSPITAL_COMMUNITY): Payer: Medicare HMO

## 2019-10-19 ENCOUNTER — Inpatient Hospital Stay (HOSPITAL_COMMUNITY): Payer: Medicare HMO | Admitting: Occupational Therapy

## 2019-10-19 ENCOUNTER — Other Ambulatory Visit: Payer: Self-pay

## 2019-10-19 LAB — GLUCOSE, CAPILLARY
Glucose-Capillary: 160 mg/dL — ABNORMAL HIGH (ref 70–99)
Glucose-Capillary: 154 mg/dL — ABNORMAL HIGH (ref 70–99)
Glucose-Capillary: 158 mg/dL — ABNORMAL HIGH (ref 70–99)
Glucose-Capillary: 190 mg/dL — ABNORMAL HIGH (ref 70–99)

## 2019-10-19 MED ORDER — GLIMEPIRIDE 1 MG PO TABS
2.0000 mg | ORAL_TABLET | Freq: Every day | ORAL | Status: DC
  Administered 2019-10-20 – 2019-10-23 (×4): 2 mg via ORAL
  Filled 2019-10-19 (×4): qty 2

## 2019-10-19 NOTE — Progress Notes (Signed)
Inpatient Rehabilitation Care Coordinator Assessment and Plan  Patient Details  Name: John Douglas MRN: [id number] Date of Birth: 1955/09/01  Today's Date: 10/19/2019  Problem List:  Patient Active Problem List   Diagnosis Date Noted  . Acute ischemic right PCA stroke ([place]) 10/15/2019  . Ischemic stroke ([place]) 10/08/2019  . Leukocytosis   . Hypokalemia   . Colon cancer ([place])   . Change in bowel habits   . Rectal bleeding   . Rectal mass   . Rectal polyp   . Polyp of descending colon   . Diverticulosis of colon without hemorrhage   . Gastrointestinal hemorrhage 08/29/2019  . Coronary artery disease 08/29/2019  . Ischemic cardiomyopathy 08/29/2019  . Paroxysmal atrial fibrillation ([facility])   . Essential hypertension   . Hyperlipidemia LDL goal <70   . Non-ST elevation (NSTEMI) myocardial infarction ([place])   . Ventricular tachycardia ([place]) 06/04/2019   Past Medical History:  Past Medical History:  Diagnosis Date  . Allergic rhinitis    [facility]  . Anxiety   . Atrial fibrillation ([place])    [facility]  . Benign essential hypertension    [facility]  . Coronary artery disease    [facility]  . Diabetic neuropathy, type II diabetes mellitus ([place])    [facility]  . Mixed hyperlipidemia    [facility]  . Myocardial infarct [facility])    [facility]  . Obesity, unspecified    [facility]  . Rectal bleeding    [facility]  . Wide-complex tachycardia ([place]) 06/04/2019   Past Surgical History:  Past Surgical History:  Procedure Laterality Date  . BIOPSY  10/07/2019   Procedure: BIOPSY;  Surgeon: Lavena Bullion, DO;  Location: [facility];  Service: Gastroenterology;;  . COLONOSCOPY WITH PROPOFOL N/A 10/07/2019   Procedure: COLONOSCOPY WITH PROPOFOL;  Surgeon: Lavena Bullion, DO;  Location: [facility]OS[facility];  Service: Gastroenterology;  Laterality: N/A;  . CORONARY ANGIOPLASTY     3 stents  . LEFT HEART CATH AND CORONARY ANGIOGRAPHY N/A 06/05/2019   Procedure: LEFT HEART CATH AND CORONARY ANGIOGRAPHY;  Surgeon: Nelva Bush, MD;  Location: [facility];  Service: Cardiovascular;  Laterality: N/A;  . POLYPECTOMY  10/07/2019   Procedure: POLYPECTOMY;  Surgeon: Lavena Bullion, DO;  Location: [facility];  Service: Gastroenterology;;  . Lia Foyer TATTOO INJECTION  10/07/2019   Procedure: SUBMUCOSAL TATTOO INJECTION;  Surgeon: Lavena Bullion, DO;  Location: [facility];  Service: Gastroenterology;;   Social History:  reports that he has never smoked. He quit smokeless tobacco use about 5 months ago.  His smokeless tobacco use included chew. He reports current alcohol use. He reports current drug use. Drug: Marijuana.  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 retired 6 yrs ago to help assist with wife who became sick in December 2013 Education: college Read: Yes Write: Yes Employment Status: Retired Date Retired/Disabled/Unemployed: 2015 Age Retired: 57 Public relations account executive Issues: Denies Guardian/Conservator: N/A  Abuse/Neglect Abuse/Neglect Assessment Can Be Completed: Yes Physical Abuse: Denies Verbal Abuse: Denies Sexual Abuse: Denies Exploitation of patient/patient's resources: Denies Self-Neglect: Denies Possible  abuse reported to:: Other (Comment)  Emotional Status Pt's affect, behavior and adjustment status: Pt in good spirits Recent Psychosocial Issues: Pt states he has situational anxiety but no diagnosis Psychiatric History: Denies Substance Abuse History: Denies; pt admits he quit chewing tobacco 40 yrs ago; admits to daily marijuana use; denies EtoH.  Patient / Family Perceptions, Expectations & Goals Pt/Family understanding of illness & functional limitations: Pt and pt family have a general understanding of care needs Premorbid pt/family roles/activities: Independent Anticipated changes in roles/activities/participation: Assistance with ADLs/IADLs Pt/family expectations/goals: Pt goal is to "increase stability in left leg/become more stable."  [employer]: None Premorbid Home Care/DME Agencies: None Transportation available at discharge: family Resource referrals recommended: Neuropsychology  Discharge Planning Living Arrangements: Alone Support Systems: Spouse/significant other, Children Type of Residence: Private residence Insurance Resources: Multimedia programmer (specify) Scientist, clinical (histocompatibility and immunogenetics) Medicare) Financial Resources: Radio broadcast assistant Screen Referred: No Living Expenses: Own Money Management: Patient Does the patient have any problems obtaining your medications?: No Home Management: Pt was managing all housekeeping/finances Patient/Family Preliminary Plans: Pt to have his dtr [name] assist with managing finances/medications Care Coordinator Barriers to Discharge: Decreased caregiver support, Lack of/limited family support Care Coordinator Anticipated Follow Up Needs: HH/OP Expected length of stay: 7-10 days  Clinical Impression SW met with pt and pt dtrs: [name] and Caroline in room to introduce self, explain role, and discuss discharge process. Pt is not a English as a second language teacher. DMe: diabetic machine: [employer]; would like to know if he needs to purchase  lancelets- SW to inform staff; has access to RW and BSC.   Jurnei Latini A Richard Holz 10/19/2019, 10:50 AM

## 2019-10-19 NOTE — Progress Notes (Signed)
Physical Therapy Session Note  Patient Details  Name: John Douglas MRN: [id number] Date of Birth: 06-21-1955  Today's Date: 10/19/2019 PT Individual Time: 0900-1015 PT Individual Time Calculation (min): 75 min   Short Term Goals: Week 1:  PT Short Term Goal 1 (Week 1): STGs = LTGS due to ELOS Week 2:    Week 3:     Skilled Therapeutic Interventions/Progress Updates:    PAIN Denies pain  Pt initially supine and agreeable to treatment.  Supine to sit w/supervision.  Dons shoes in sitting w/set up and supervision for balance. SPT to wc w/CGA. wc propulsion x 137ft w/bilat LEs w/supervison, performed for LLE strenght and attention to LLE.  Gait trials: STS to RW w/cga and gait x >320ft w/cga, improved clearance LLE from prior sessions. Pt rested x 3-4 min then performed the following: Gait x 233ft including turning x 4 w/cga to min assist, veers to L w/mildly increasing L lean w/fatigue, pt recalled cues to focus on heelstrike LLE.  Coordination tasks: In standing pt lifts and places L foot in targets/horseshoes placed on floor attempting to control placement/increased difficulty w/crossing miline and rotating and abducting to L Standing tapping cones placed in various patterns w/emphasis on control w/lifting and placing of LLE./dysmetria   Standing balance: Static stand - increased sway, cga Static stand w/eyes closed - moderate increased sway, cga tp min assist Marching in place - min assist, l tendency, delayed reactions Standing heel raises - cga retrogait - forward/back 7ft x 4 passes each direction, min assist for balance, improved performance w/close attention to task, decreased coordination and step length w/LLE w/retrogait.   Functional gait: Ascend/descend ramp w/single rail support and cga, increased tendency for decreased clearance LLE esp ascending.  Uneven surfaces: Gait on uneven mulch surface with and without single rail w/cga to min assist, pt slows cadence and  increases effort for clearance of LLE w/steppage appearing pattern initially and normalizes partially w/repeated effort.  wc propulsion x 53ft w/bilat LEs  Gait 218ft w/RW and cga, decreased LLE clearance slightly due to fatiigue. Turn/sit to bed w/cga.  Removes shoes w/supervision.  Sit to supine mod I.   Pt left supine w/rails up x 3, alarm set, bed in lowest position, and needs in reach.   Therapy Documentation Precautions:  Precautions Precautions: Fall Restrictions Weight Bearing Restrictions: No    Therapy/Group: Individual Therapy  Callie Fielding, [place] 10/19/2019, 12:53 PM

## 2019-10-19 NOTE — Progress Notes (Signed)
[facility] PHYSICAL MEDICINE & REHABILITATION PROGRESS NOTE   Subjective/Complaints:  Pt reports stools had a TINGE of pink, but no clots, obvious blood in stools.  Having a lot of gas- LBM this AM- using heating pad for LUE.    ROS: Pt denies SOB, abd pain, CP, N/V/C/D, and vision changes   Objective:   No results found. Recent Labs    10/17/19 0524  WBC 9.8  HGB 13.0  HCT 39.7  PLT 282   No results for input(s): NA, K, CL, CO2, GLUCOSE, BUN, CREATININE, CALCIUM in the last 72 hours.  Intake/Output Summary (Last 24 hours) at 10/19/2019 1138 Last data filed at 10/19/2019 0900 Gross per 24 hour  Intake 840 ml  Output --  Net 840 ml     Physical Exam: Vital Signs Blood pressure (!) 153/88, pulse 61, temperature 98 F (36.7 C), resp. rate 20, height 5\' 10"  (1.778 m), weight 94.8 kg, SpO2 97 %. General: Alert and oriented x 3, No apparent distress; appropriate, sitting up in bed; heating pad on L arm, NAD HEENT: conjugate gaze Heart: RRR- borderline bradycardic- regular rhythm Chest: CTA B/L- no W/R/R- good air movement Abdomen: Soft, NT, ND, (+)BS  Extremities: No clubbing, cyanosis, or edema. Pulses are 2+ Skin: Clean and intact without signs of breakdown Neuro: Alert and oriented x3. Impaired delayed recall. Cranial nerves 2-12 are intact. Motor function is grossly 5/5 on right side, 4+/5 left upper extremity and 4-/5 in left lower extremity with ataxia. Performed sit to stand with MinA  Psych: pt appropriate  Assessment/Plan: 1. Functional deficits secondary to acute ischemic left PCA stroke which require 3+ hours per day of interdisciplinary therapy in a comprehensive inpatient rehab setting.  Physiatrist is providing close team supervision and 24 hour management of active medical problems listed below.  Physiatrist and rehab team continue to assess barriers to discharge/monitor patient progress toward functional and medical goals  Care Tool:  Bathing     Body parts bathed by patient: Right arm, Left arm, Chest, Abdomen, Front perineal area, Buttocks, Right upper leg, Left upper leg, Right lower leg, Left lower leg, Face         Bathing assist Assist Level: Contact Guard/Touching assist     Upper Body Dressing/Undressing Upper body dressing   What is the patient wearing?: Hospital gown only    Upper body assist Assist Level: Set up assist    Lower Body Dressing/Undressing Lower body dressing      What is the patient wearing?: Underwear/pull up, Pants     Lower body assist Assist for lower body dressing: Contact Guard/Touching assist     Toileting Toileting    Toileting assist Assist for toileting: Supervision/Verbal cueing     Transfers Chair/bed transfer  Transfers assist     Chair/bed transfer assist level: Contact Guard/Touching assist     Locomotion Ambulation   Ambulation assist      Assist level: Contact Guard/Touching assist Assistive device: Walker-rolling Max distance: 350   Walk 10 feet activity   Assist     Assist level: Contact Guard/Touching assist Assistive device: Walker-rolling   Walk 50 feet activity   Assist    Assist level: Contact Guard/Touching assist Assistive device: Walker-rolling    Walk 150 feet activity   Assist    Assist level: Contact Guard/Touching assist Assistive device: Walker-rolling    Walk 10 feet on uneven surface  activity   Assist     Assist level: Minimal Assistance - Patient > 75%  Wheelchair     Assist Will patient use wheelchair at discharge?: No             Wheelchair 50 feet with 2 turns activity    Assist            Wheelchair 150 feet activity     Assist          Blood pressure (!) 153/88, pulse 61, temperature 98 F (36.7 C), resp. rate 20, height 5\' 10"  (1.778 m), weight 94.8 kg, SpO2 97 %.  Medical Problem List and Plan: 1.  Impaired mobility and ADLs secondary to acute ischemic right PCA  stroke.             -patient may shower             -ELOS/Goals: 7 days modI  -Continue CIR 2.  Antithrombotics: -DVT/anticoagulation:  Mechanical: Sequential compression devices, below knee Bilateral lower extremities and TEDs             -antiplatelet therapy: Have restarted Plavix- no more hematochezia.  6/21- no bleeding- con't Plavix  3. Pain Management: Will add low dose gabapentin and change robaxin to baclofen prn  muscle spasms. Currently well controlled.  4. Mood: LCSW to follow for evaluation and support.              -antipsychotic agents: N/A 5. Neuropsych: This patient is capable of making decisions on his own behalf. 6. Skin/Wound Care: Routine pressure relief measures. 7. Fluids/Electrolytes/Nutrition: Monitor intake/output.  Offer supplements as needed Po intake 8.  CAD s/p PCI to RCA 12/20: On DAPT and Crestor.  9.  T2DM: Hemoglobin A1c 8.0.  Blood sugars ac/hs--has been off meds X 1 year due to improvement in A1c-5.4 (loss weight and dieting).   CBGs have been well controlled. CBG (last 3)  Recent Labs    10/18/19 2105 10/18/19 2108 10/19/19 0607  GLUCAP 228* 236* 160*    6/21- BGs 160s to 220s-  Continues ot be elevated- will add Amaryl 2 mg daily.  10.  Hematochezia,/iron deficiency anemia: Stable 11.  Hypokalemia: Stable 12.  Stage IV colon cancer: Monitor for signs of bleeding is now back on DAPT.  6/19: had large amount of bloody stools. Held plavix. CBC repeated today and Hgb stable. Had small bloody stools today. Continue to monitor.   6/20: Plavix restarted. See #2.  13.  PAF: Monitor heart rate 3 times daily.  Continue metoprolol twice daily. Currently well controlled.   6/21- in RRR currently- con't meds 14.  HTN: Monitor blood pressures 3 times daily.  Continue low-dose lisinopril as well as metoprolol. Labile: continue to monitor.  15. Situational anxiety: Would like to be started on something. Will start Celexa. Continue Klonopin prn has being  effective 16. Insomnia: slept better last night.  17. Leukocytosis: normalized.   LOS: 4 days A FACE TO FACE EVALUATION WAS PERFORMED  Kenlie Seki 10/19/2019, 11:38 AM

## 2019-10-19 NOTE — Care Management (Signed)
[facility] Individual Statement of Services  Patient Name:  John Douglas  Date:  10/19/2019  Welcome to the [facility].  Our goal is to provide you with an individualized program based on your diagnosis and situation, designed to meet your specific needs.  With this comprehensive rehabilitation program, you will be expected to participate in at least 3 hours of rehabilitation therapies Monday-Friday, with modified therapy programming on the weekends.  Your rehabilitation program will include the following services:  Physical Therapy (PT), Occupational Therapy (OT), Speech Therapy (ST), 24 hour per day rehabilitation nursing, Therapeutic Recreaction (TR), Psychology, Neuropsychology, Care Coordinator, Rehabilitation Medicine, Nutrition Services, Pharmacy Services and Other  Weekly team conferences will be held on Tuesdays to discuss your progress.  Your Inpatient Rehabilitation Care Coordinator will talk with you frequently to get your input and to update you on team discussions.  Team conferences with you and your family in attendance may also be held.  Expected length of stay: 7-10 days   Overall anticipated outcome: Independent with Assistive Device  Depending on your progress and recovery, your program may change. Your Inpatient Rehabilitation Care Coordinator will coordinate services and will keep you informed of any changes. Your Inpatient Rehabilitation Care Coordinator's name and contact numbers are listed  below.  The following services may also be recommended but are not provided by the [facility] will be made to provide these services after discharge if needed.  Arrangements include referral to agencies that provide these services.  Your insurance has been verified to be:  [employer]  Your primary doctor is:  Banker  Pertinent information will be shared with your doctor and your insurance company.  Inpatient Rehabilitation Care Coordinator:  Cathleen Corti [phone] or ([phone]  Information discussed with and copy given to patient by: Rana Snare, 10/19/2019, 11:31 AM

## 2019-10-19 NOTE — Progress Notes (Signed)
Occupational Therapy Session Note  Patient Details  Name: John Douglas MRN: [id number] Date of Birth: 04-16-1956  Today's Date: 10/19/2019 OT Individual Time: 1100-1145 OT Individual Time Calculation (min): 45 min  15 minutes missed OT d/t fatigue   Short Term Goals: Week 1:  OT Short Term Goal 1 (Week 1): STGs = LTGs d/t short ELOS  Skilled Therapeutic Interventions/Progress Updates:    Pt greeted at time of session supine in bed resting and sleepy but agreeable to OT session for morning ADL routine. Bed mobility with Mod I able to sit up at EOB and then ambulate to bathroom with supervision - CGA with RW and performed toileting and UB/LB bathing with supervision - CGA. CGA for bathroom transfers but pt able to perform clothing management during toileting and washing during bathing while seated on TTB with supervision. Therapist provided demonstration for safe transfer technique using RW to back up to TTB prior to sitting and pt receptive. Pt dried off after shower with supervision and donned new underwear in standing, ambulated to room and gathered his clothing with RW for support and performed UB/LB dressing with set up for UB and CGA-supervision for LB. Pt did report more fatigue today, especially in his LLE. Note one episode of pt exhibiting L side inattention running into object on his L side when ambulating. Pt's daughter entered the room and pt stated he was too tired to continue, requested ending session early d/t fatigue. Therapist provided alternate options for low impact activity but pt declined. 15 minutes of missed OT d/t fatigue.   Therapy Documentation Precautions:  Precautions Precautions: Fall Restrictions Weight Bearing Restrictions: No     Therapy/Group: Individual Therapy  Viona Gilmore 10/19/2019, 2:15 PM

## 2019-10-19 NOTE — Progress Notes (Signed)
Occupational Therapy Session Note  Patient Details  Name: John Douglas MRN: [id number] Date of Birth: Mar 14, 1956  Today's Date: 10/19/2019 OT Individual Time: 1300-1400 OT Individual Time Calculation (min): 60 min    Short Term Goals: Week 1:  OT Short Term Goal 1 (Week 1): STGs = LTGs d/t short ELOS  Skilled Therapeutic Interventions/Progress Updates:    patient asleep in bed, arouses with min stim.  He denies pain but c/o fatigue t/o session.  Bed mobility with CS. Sit to stand and short distance ambulation with RW to/from bed, toilet and w/c with CGA/CS.  toileting and hand hygiene completed with CS.   Completed box and blocks:  R = 53, L = 44 9 hole peg:  R = 21 sec, L = 25 sec dynavision - whole screen, 2 min in seated position:  R = 2.13 sec, L = 2.35 sec ( note left side of screen 2x slower than right) - reviewed left side awareness, safety and scanning strategies Completed UB ergometer x 5 minutes Returned to bed at close of session - bed alarm set and call bell in reach.    Therapy Documentation Precautions:  Precautions Precautions: Fall Restrictions Weight Bearing Restrictions: No   Therapy/Group: Individual Therapy  Carlos Levering 10/19/2019, 7:53 AM

## 2019-10-20 ENCOUNTER — Inpatient Hospital Stay (HOSPITAL_COMMUNITY): Payer: Medicare HMO | Admitting: Physical Therapy

## 2019-10-20 ENCOUNTER — Encounter (HOSPITAL_COMMUNITY): Payer: Medicare HMO | Admitting: Psychology

## 2019-10-20 ENCOUNTER — Inpatient Hospital Stay (HOSPITAL_COMMUNITY): Payer: Medicare HMO | Admitting: Occupational Therapy

## 2019-10-20 ENCOUNTER — Inpatient Hospital Stay (HOSPITAL_COMMUNITY): Payer: Medicare HMO

## 2019-10-20 LAB — GLUCOSE, CAPILLARY
Glucose-Capillary: 145 mg/dL — ABNORMAL HIGH (ref 70–99)
Glucose-Capillary: 191 mg/dL — ABNORMAL HIGH (ref 70–99)
Glucose-Capillary: 92 mg/dL (ref 70–99)
Glucose-Capillary: 89 mg/dL (ref 70–99)
Glucose-Capillary: 174 mg/dL — ABNORMAL HIGH (ref 70–99)
Glucose-Capillary: 149 mg/dL — ABNORMAL HIGH (ref 70–99)

## 2019-10-20 NOTE — Patient Care Conference (Signed)
Inpatient RehabilitationTeam Conference and Plan of Care Update Date: 10/20/2019   Time: 1:54 PM    Patient Name: John Douglas      Medical Record Number: [id number]  Date of Birth: 16-Jun-1955 Sex: Male         Room/Bed: 4M13C/4M13C-01 Payor Info: Payor: [employer] MEDICARE / Plan: [employer] MEDICARE HMO/PPO / Product Type: *No Product type* /    Admit Date/Time:  10/15/2019  4:08 PM  Primary Diagnosis:  Acute ischemic right PCA stroke [facility])  Patient Active Problem List   Diagnosis Date Noted  . Acute ischemic right PCA stroke ([place]) 10/15/2019  . Ischemic stroke ([place]) 10/08/2019  . Leukocytosis   . Hypokalemia   . Colon cancer ([facility])   . Change in bowel habits   . Rectal bleeding   . Rectal mass   . Rectal polyp   . Polyp of descending colon   . Diverticulosis of colon without hemorrhage   . Gastrointestinal hemorrhage 08/29/2019  . Coronary artery disease 08/29/2019  . Ischemic cardiomyopathy 08/29/2019  . Paroxysmal atrial fibrillation ([facility])   . Essential hypertension   . Hyperlipidemia LDL goal <70   . Non-ST elevation (NSTEMI) myocardial infarction ([place])   . Ventricular tachycardia ([facility]) 06/04/2019    Expected Discharge Date: Expected Discharge Date: 10/23/19  Team Members Present: Physician leading conference: Dr. Courtney Heys Care Coodinator Present: Loralee Pacas, LCSWA;Ripken Rekowski Creig Hines, RN, BSN, [place] Nurse Present: Other (comment) Dina Rich, RN) PT Present: Tereasa Coop, PT OT Present: Lillia Corporal, OT PPS Coordinator present : Gunnar Fusi, SLP     Current Status/Progress Goal Weekly Team Focus  Bowel/Bladder   Pt is continet of B/B. pt has excessive amount of gas. LBM 10/19/19  pt will remain continent of b/b  Q2h toileting   Swallow/Nutrition/ Hydration             ADL's   CGA-Supervision overall, CGA for bathroom transfers, supervision for UB/LB bathing at shower level with bench, functional mobility CGA-Supervision  Mod I (lives alone but gf for  support - planning on moving in together in a couple weeks)  ADL transfers, L side inattention, LB ADLs, endurance   Mobility   independent bed mobility, CGA transfers, CGA gait with RW >300'. minA gait without AD.  mod(I)  balance, LLE strength and coordination, gait with/without AD, DC prep.   Communication             Safety/Cognition/ Behavioral Observations            Pain   pt c/o of minor headache, PRN tylenol effective  Pt will be pain free  Assess pain qshit/prn   Skin   no obvious signs of breakdown or infection at this time  Remain free of infection  assess skin qshift    Rehab Goals Patient on target to meet rehab goals: Yes *See Care Plan and progress notes for long and short-term goals.     Barriers to Discharge  Current Status/Progress Possible Resolutions Date Resolved   Nursing                  PT                    OT Medical stability                SLP                Care Coordinator Decreased caregiver support;Lack of/limited family support Caregivers work  Discharge Planning/Teaching Needs:  D/c to home with support from children and significant other  Family education as recommended by therapy   Team Discussion:  Continent of B/B. BG are stable, only requires SSI when elevated and oral medications on board. Left inattention is new and therapy working on strategies to assist with at discharge. Supervision to Contact guard. Progressing toward discharge of 10/23/19.  Revisions to Treatment Plan:  none    Medical Summary Current Status: doing well- BGs a little elevated/needed SSI- got Amaryl- continent- pleasant; no barriers so far; d/c home- little family support Weekly Focus/Goal: OT- supervision to CGA overall- goals mod I- L inattention noted last few days- runs into things on L  Barriers to Discharge: Home enviroment access/layout;Decreased family/caregiver support;Weight bearing restrictions;Weight  Barriers to Discharge Comments: d/c  friday- has daughter Crystal and GF Possible Resolutions to Barriers: PT- doing well- Sup for everything- walking ont readmill- min A if no AD- friday good d/c.   Continued Need for Acute Rehabilitation Level of Care: The patient requires daily medical management by a physician with specialized training in physical medicine and rehabilitation for the following reasons: Direction of a multidisciplinary physical rehabilitation program to maximize functional independence : Yes Medical management of patient stability for increased activity during participation in an intensive rehabilitation regime.: Yes Analysis of laboratory values and/or radiology reports with any subsequent need for medication adjustment and/or medical intervention. : Yes   I attest that I was present, lead the team conference, and concur with the assessment and plan of the team.   Cristi Loron 10/20/2019, 1:54 PM

## 2019-10-20 NOTE — Consult Note (Signed)
Neuropsychological Consultation   Patient:   John Douglas   DOB:   08/14/1955  MR Number:  [id number]  Location:  [facility] [address] [facility] [address] [place] [id number] [place] [address] Dept: [place]: [phone]           Date of Service:   10/20/2019  Start Time:   1 PM End Time:   2 PM  Provider/Observer:  Ilean Skill, Psy.D.       Clinical Neuropsychologist       Billing Code/Service: [phone]  Chief Complaint:    John Douglas is a 64 year old male with a history of hypertension, wide-complex tachycardia/AF, type 2 diabetes with neuropathy, hematochezia with colonoscopy 10/07/2019 + for rectal adenocarcinoma admitted on 10/08/2019 with dizziness, several day history of weakness followed by LLE weakness and difficulty walking.  Patient found to have A. 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  Patient had been off Eliquis for a month and on DAPT due to bloody stool.  Neurologist felt stroke was embolic in nature due to A. fib with question hypercoagulability with new dx of cancer.    Reason for Service:  The patient was referred for neuropsychological consultation due to some residual cognitive and motor deficits following has PCA infarct and coping with and dealing with recent diagnosis of cancer and oncology studies for implementing chemotherapy.  Below see HPI for the current admission.  HPI: John Douglas is a 64 year old male with history of HTN, wide-complex tachycardia/AF, T2DM with neuropathy, hematochezia with colonoscopy 06/09/2021positive for rectal adenocarcinoma who was admitted via our H on 10/08/2019 with dizziness, several day history of weakness followed by LLE weakness and difficulty walking.  He was found to have A. fib with RVR and CT of head done revealing right PCA infarct with petechial hemorrhage.  CTA head/neck showed normal intracranial and cervical  arteries.  Patient had been off Eliquis for a month and on DAPT due to bloody stools.  Dr. Leonie Man felt that stroke was embolic due to A. fib with question hypercoagulopathy with new diagnosis of cancer.  DAPT was held due to petechial hemorrhage and low-dose ASA resumed on 06/11.  CT chest, abdomen, pelvis done for staging and showed retroperitoneal and upper pelvic lymphadenopathy, hyperenhancing focus left hepatic lobe with surrounding parenchymal attenuation felt to be hemangioma and mild distal circumferential esophageal thickening.  MRI pelvis showed rectal adenocarcinoma T4N2 with tumor 4 cm distance from anal sphincter.  Dr. Marin Olp was consulted for input on new diagnosis of rectal cancer in work-up revealed elevated CEA and tumor markers pending.  Patient wants to hold off on Port-A-Cath until after his July 4 beach trip.  MRI liver done and was negative for metastatic disease.  Dr. Donne Hazel  was consulted and recommended chemo and follow-up in office for input --patient not felt to be a candidate for surgery.  He was infused with Feraheme 510 mg due to low iron stores.  He did have trace amount blood in BM on 06/16.  Plavix was resumed with recommendations to monitor for rectal bleeding.  Current Status:  The patient denied any significant mood disturbance.  He reports that it has been difficult for him to be in the hospital for this extended period of time.  He reports that now that he has been given a discharge date (Friday) and he feels like he is managing better.  He has been working on therapies and only  identifies some changes in visual-spatial functioning as well as weakness for his left leg.  The patient reports that all other cognitive functions appear to be within normal limits and at baseline.  The patient reports that he is coping fairly well given the recent medical diagnoses and plan upcoming medical interventions with regard to his cancer.  The cancer is not considered to be an operable  1 but they do plan on utilizing a Port-A-Cath for chemotherapy.  This is being held off until he completes a planned trip to the beach with his family.  Mental status appears to be within normal limits, expressive and receptive language intact, memory within normal limits and executive functioning within normal limits.  The patient reports he has made significant improvements in his motor function of the past several days is quite pleased with the improvement.  He understands the therapy goals and reasons for the various therapeutic interventions.  Behavioral Observation: John Douglas  presents as a 64 y.o.-year-old Right Caucasian Male who appeared his stated age. his dress was Appropriate and he was Well Groomed and his manners were Appropriate to the situation.  his participation was indicative of Appropriate and Attentive behaviors.  There were any physical disabilities noted.  he displayed an appropriate level of cooperation and motivation.     Interactions:    Active Appropriate and Attentive  Attention:   within normal limits and attention span and concentration were age appropriate  Memory:   within normal limits; recent and remote memory intact  Visuo-spatial:  abnormal - The patient describes some visual spatial changes that are likely directly related to his PCA stroke.  Speech (Volume):  normal  Speech:   normal; normal  Thought Process:  Coherent and Relevant  Though Content:  WNL; not suicidal and not homicidal  Orientation:   person, place, time/date and situation  Judgment:   Good  Planning:   Good  Affect:    Appropriate  Mood:    Dysphoric  Insight:   Good  Intelligence:   high  Medical History:   Past Medical History:  Diagnosis Date  . Allergic rhinitis    [facility]  . Anxiety   . Atrial fibrillation ([place])    [facility]  . Benign essential hypertension    [facility]  . Coronary artery  disease    [facility]  . Diabetic neuropathy, type II diabetes mellitus ([place])    [facility]  . Mixed hyperlipidemia    [facility]  . Myocardial infarct [facility])    [facility]  . Obesity, unspecified    [facility]  . Rectal bleeding    [facility]  . Wide-complex tachycardia ([place]) 06/04/2019   Psychiatric History:  Patient does have a past history of anxiety but denies any exacerbation of his anxiety beyond what would be normal expected response given his recent medical events.  Family Med/Psych History:  Family History  Problem Relation Age of Onset  . Diverticulitis Mother   . Hypertension Father   . Heart disease Father        MI  . Prostate cancer Father   . Colon cancer Neg Hx   . Stomach cancer Neg Hx   . Pancreatic cancer Neg Hx   . Rectal cancer Neg Hx     Impression/DX:  John Douglas is a 64 year old male with a history of hypertension, wide-complex tachycardia/AF,  type 2 diabetes with neuropathy, hematochezia with colonoscopy 10/07/2019 + for rectal adenocarcinoma admitted on 10/08/2019 with dizziness, several day history of weakness followed by LLE weakness and difficulty walking.  Patient found to have A. 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  Patient had been off Eliquis for a month and on DAPT due to bloody stool.  Neurologist felt stroke was embolic in nature due to A. fib with question hypercoagulability with new dx of cancer.  The patient denied any significant mood disturbance.  He reports that it has been difficult for him to be in the hospital for this extended period of time.  He reports that now that he has been given a discharge date (Friday) and he feels like he is managing better.  He has been working on therapies and only identifies some changes in visual-spatial functioning as well as weakness for his left  leg.  The patient reports that all other cognitive functions appear to be within normal limits and at baseline.  The patient reports that he is coping fairly well given the recent medical diagnoses and plan upcoming medical interventions with regard to his cancer.  The cancer is not considered to be an operable 1 but they do plan on utilizing a Port-A-Cath for chemotherapy.  This is being held off until he completes a planned trip to the beach with his family.  Mental status appears to be within normal limits, expressive and receptive language intact, memory within normal limits and executive functioning within normal limits.  The patient reports he has made significant improvements in his motor function of the past several days is quite pleased with the improvement.  He understands the therapy goals and reasons for the various therapeutic interventions.  Diagnosis:   CVA        Electronically Signed   _______________________ Ilean Skill, Psy.D.

## 2019-10-20 NOTE — Plan of Care (Signed)
  Problem: Consults Goal: RH STROKE PATIENT EDUCATION Description: See Patient Education module for education specifics moderate assistance Outcome: Progressing Goal: Diabetes Guidelines if Diabetic/Glucose > 140 Description: If diabetic or lab glucose is > 140 mg/dl - Initiate Diabetes/Hyperglycemia Guidelines & Document Interventions  Outcome: Progressing   Problem: RH BOWEL ELIMINATION Goal: RH STG MANAGE BOWEL WITH ASSISTANCE Description: STG Manage Bowel with Assistance.moderate assistance Outcome: Progressing   Problem: RH BLADDER ELIMINATION Goal: RH STG MANAGE BLADDER WITH ASSISTANCE Description: STG Manage Bladder With moderate Assistance Outcome: Progressing   Problem: RH SAFETY Goal: RH STG ADHERE TO SAFETY PRECAUTIONS W/ASSISTANCE/DEVICE Description: STG Adhere to Safety Precautions With moderate Assistance/Device. Outcome: Progressing Goal: RH STG DECREASED RISK OF FALL WITH ASSISTANCE Description: STG Decreased Risk of Fall With Assistance. Outcome: Progressing   Problem: RH COGNITION-NURSING Goal: RH STG USES MEMORY AIDS/STRATEGIES W/ASSIST TO PROBLEM SOLVE Description: STG Uses Memory Aids/Strategies With  moderate Assistance to Problem Solve. Outcome: Progressing Goal: RH STG ANTICIPATES NEEDS/CALLS FOR ASSIST W/ASSIST/CUES Description: STG Anticipates Needs/Calls for Assist With moderate Assistance/Cues. Outcome: Progressing   Problem: RH KNOWLEDGE DEFICIT Goal: RH STG INCREASE KNOWLEDGE OF DIABETES Description: Moderate assistance Outcome: Progressing Goal: RH STG INCREASE KNOWLEDGE OF HYPERTENSION Description: Moderate assistance Outcome: Progressing

## 2019-10-20 NOTE — Progress Notes (Signed)
Patient ID: John Douglas, male   DOB: Feb 24, 1956, 64 y.o.   MRN: [id number]  SW met with pt in room to provide updates from team conference, and d/c date 10/23/2019. Pt called his dtr Krystle while SW in room to provide updates. SW informed on DME recs: 3in1 BSC and RW needed. Outpatient PT/OT/ST referral will be [facility] to [facility]. Pt and pt dtr confirm that pt will have supervision at time of discharge. SW waiting on follow-up from dtr about family education tomorrow with his dtr and possibly s/o Foye Clock.  *SW received message from pt dtr stating Pam will be present for family edu tomorrow. SW informed front desk.   Loralee Pacas, MSW, [place] Office: [phone] Cell: [phone] Fax: [phone]

## 2019-10-20 NOTE — Progress Notes (Addendum)
[facility] PHYSICAL MEDICINE & REHABILITATION PROGRESS NOTE   Subjective/Complaints:  Pt reports having loose stools- never has formed stool- and has a lot of gas- is chronic.   Voiding well- no pain but has thigh "tired".    ROS:  Pt denies SOB, abd pain, CP, N/V/C/D, and vision changes   Objective:   No results found. No results for input(s): WBC, HGB, HCT, PLT in the last 72 hours. No results for input(s): NA, K, CL, CO2, GLUCOSE, BUN, CREATININE, CALCIUM in the last 72 hours.  Intake/Output Summary (Last 24 hours) at 10/20/2019 0903 Last data filed at 10/19/2019 1902 Gross per 24 hour  Intake 360 ml  Output --  Net 360 ml     Physical Exam: Vital Signs Blood pressure (!) 133/91, pulse 68, temperature 99.8 F (37.7 C), temperature source Oral, resp. rate 18, height 5\' 10"  (1.778 m), weight 94.8 kg, SpO2 98 %. General: Alert and oriented x 3, sitting up in bed; PT in room, NAD HEENT: conjugate gaze Heart: RRR Chest: CTA B/L- no W/R/R- good air movement Abdomen: Soft, NT, ND, (+)BS   Extremities: No clubbing, cyanosis, or edema. Pulses are 2+ Skin: Clean and intact without signs of breakdown Neuro: Alert and oriented x3. Impaired delayed recall. Cranial nerves 2-12 are intact. Motor function is grossly 5/5 on right side, 4+/5 left upper extremity and 4-/5 in left lower extremity with ataxia. Performed sit to stand with MinA  Psych: pt bright, pleasant  Assessment/Plan: 1. Functional deficits secondary to acute ischemic right PCA stroke which require 3+ hours per day of interdisciplinary therapy in a comprehensive inpatient rehab setting.  Physiatrist is providing close team supervision and 24 hour management of active medical problems listed below.  Physiatrist and rehab team continue to assess barriers to discharge/monitor patient progress toward functional and medical goals  Care Tool:  Bathing    Body parts bathed by patient: Right arm, Left arm, Chest,  Abdomen, Front perineal area, Buttocks, Right upper leg, Left upper leg, Right lower leg, Left lower leg, Face         Bathing assist Assist Level: Supervision/Verbal cueing     Upper Body Dressing/Undressing Upper body dressing   What is the patient wearing?: Pull over shirt    Upper body assist Assist Level: Set up assist    Lower Body Dressing/Undressing Lower body dressing      What is the patient wearing?: Underwear/pull up, Pants     Lower body assist Assist for lower body dressing: Contact Guard/Touching assist     Toileting Toileting    Toileting assist Assist for toileting: Supervision/Verbal cueing     Transfers Chair/bed transfer  Transfers assist     Chair/bed transfer assist level: Contact Guard/Touching assist     Locomotion Ambulation   Ambulation assist      Assist level: Contact Guard/Touching assist Assistive device: Walker-rolling Max distance: 300   Walk 10 feet activity   Assist     Assist level: Contact Guard/Touching assist Assistive device: Walker-rolling   Walk 50 feet activity   Assist    Assist level: Contact Guard/Touching assist Assistive device: Walker-rolling    Walk 150 feet activity   Assist    Assist level: Contact Guard/Touching assist Assistive device: Walker-rolling    Walk 10 feet on uneven surface  activity   Assist     Assist level: Contact Guard/Touching assist Assistive device: Other (comment) (rail)   Wheelchair     Assist Will patient use wheelchair at  discharge?: No             Wheelchair 50 feet with 2 turns activity    Assist            Wheelchair 150 feet activity     Assist          Blood pressure (!) 133/91, pulse 68, temperature 99.8 F (37.7 C), temperature source Oral, resp. rate 18, height 5\' 10"  (1.778 m), weight 94.8 kg, SpO2 98 %.  Medical Problem List and Plan: 1.  Impaired mobility and ADLs secondary to acute ischemic right PCA  stroke.             -patient may shower             -ELOS/Goals: 7 days modI  -Continue CIR 2.  Antithrombotics: -DVT/anticoagulation:  Mechanical: Sequential compression devices, below knee Bilateral lower extremities and TEDs             -antiplatelet therapy: Have restarted Plavix- no more hematochezia.  6/21- no bleeding- con't Plavix  3. Pain Management: Will add low dose gabapentin and change robaxin to baclofen prn  muscle spasms. Currently well controlled.  4. Mood: LCSW to follow for evaluation and support.              -antipsychotic agents: N/A 5. Neuropsych: This patient is capable of making decisions on his own behalf. 6. Skin/Wound Care: Routine pressure relief measures. 7. Fluids/Electrolytes/Nutrition: Monitor intake/output.  Offer supplements as needed Po intake 8.  CAD s/p PCI to RCA 12/20: On DAPT and Crestor.  9.  T2DM: Hemoglobin A1c 8.0.  Blood sugars ac/hs--has been off meds X 1 year due to improvement in A1c-5.4 (loss weight and dieting).   CBGs have been well controlled. CBG (last 3)  Recent Labs    10/19/19 2042 10/20/19 0639 10/20/19 0805  GLUCAP 190* 145* 191*    6/21- BGs 160s to 220s-  Continues ot be elevated- will add Amaryl 2 mg daily.   6/22- slightly improved once got Amaryl this AM 10.  Hematochezia,/iron deficiency anemia: Stable 11.  Hypokalemia: Stable 12.  Stage IV colon cancer: Monitor for signs of bleeding is now back on DAPT.  6/19: had large amount of bloody stools. Held plavix. CBC repeated today and Hgb stable. Had small bloody stools today. Continue to monitor.   6/20: Plavix restarted. See #2  6/22- no rectal bleeding documented.  13.  PAF: Monitor heart rate 3 times daily.  Continue metoprolol twice daily. Currently well controlled.   6/21- in RRR currently- con't meds 14.  HTN: Monitor blood pressures 3 times daily.  Continue low-dose lisinopril as well as metoprolol. Labile: continue to monitor.  15. Situational anxiety: Would like  to be started on something. Will start Celexa. Continue Klonopin prn has being effective 16. Insomnia: slept better last night.  17. Leukocytosis: normalized.  18. Dispo  6/22- team conference today- d/c planned for Friday LOS: 5 days A FACE TO FACE EVALUATION WAS PERFORMED  Sharanya Templin 10/20/2019, 9:03 AM

## 2019-10-20 NOTE — Progress Notes (Signed)
Occupational Therapy Session Note  Patient Details  Name: John Douglas MRN: [id number] Date of Birth: 08/26/1955  Today's Date: 10/20/2019 OT Individual Time: [phone] and 2563-8937 OT Individual Time Calculation (min): 64 min and 40 min   Short Term Goals: Week 1:  OT Short Term Goal 1 (Week 1): STGs = LTGs d/t short ELOS  Skilled Therapeutic Interventions/Progress Updates:    Pt greeted at time of session reclined in bed, no c/o pain throughout. Pt ambulated to his drawers to pick out clothing, performed with supervision with RW and ambulated to bathroom where he performed UB/LB bathing at shower level with supervision overall at TTB. Pt stated he has friend who has a TTB he will borrow, does not want one ordered but does want 3-in-1 BSC since he does not have grab bars at home and new home set up is TBD. Performed UB dressing set up and LB dressing supervision at EOB including socks with set up. Functional mobility throughout room to/from bathroom and collecting clothes with supervision with RW. Extensive discussion with the pt regarding home set up, DME needs, DC planning. Pt did have some L side inattention throughout session hitting objects on his L side and cues for attending to L side to decrease risk of injury provided. Pt returned to supine with supervision and alarm on, call bell in reach, all needs met.   Session 2: Pt greeted at time of session reclined in bed sleepy but once awake agreeable to OT session. Stand pivot transfer to wheelchair supervision and brought to ADL apartment for practice in kitchen and bathroom. IADL simulated tasks of retrieving items from fridge, shelves, and various places throughout kitchen with cues for RW placement and safety in kitchen. Instructions for supporting self on counter surface when reaching/bending for stable surface and always staying in his RW. All performed with supervision. Pt set up in bathroom and performed TTB transfer with  CGA/Supervision with use of RW with difficulty maneuvering LLE over tub wall but able to perform with extended time. Transferred back out of TTB in the same manner and brought back to room to practice side stepping, which he performed with CGA-Supervision. Pt stated bathroom door is 24" wide, walker is approx 20" so should be okay in his home. Pt states he has a friend with TTB so does not need one ordered. Pt back to sitting EOB left with nursing checking vitals with alarm on and call bell in reach.   Therapy Documentation Precautions:  Precautions Precautions: Fall Restrictions Weight Bearing Restrictions: No    Therapy/Group: Individual Therapy  Viona Gilmore 10/20/2019, 9:50 AM

## 2019-10-20 NOTE — Progress Notes (Signed)
Physical Therapy Session Note  Patient Details  Name: John Douglas MRN: [id number] Date of Birth: 04/02/1956  Today's Date: 10/20/2019 PT Individual Time: 1003-1100 PT Individual Time Calculation (min): 57 min   Short Term Goals: Week 1:  PT Short Term Goal 1 (Week 1): STGs = LTGS due to ELOS  Skilled Therapeutic Interventions/Progress Updates:     Pt received supine in bed and agreeable to therapy. No report of pain. Bed mobility independent. Sit to stand and stand pivot to [facility] with supervision and verbal cues for hand placement and positioning. WC transport to therapy gym for time management.  Pt performs gait training with Litegait over treadmill. PT dons harness with pt in standing and pt steps up onto treadmill with CGA. Mirror positioned to provide visual feedback. Pt ambulates following bouts:  7:03 for 638' at 1-1.2 mph 6:48 for 681' at 1.2 mph  PT provides verbal and tactile cues for improved upright posture, increased hip extension and periodically facilitating LLE foot clearance with assistance for hip flexion and dorsiflexion. Pt starts each bout with BUE support and gradually progresses to no UE support. Pt has tendency to lag hip and ambulate in forward flexed posture. PT placed theraband in front of pt to provide visual and tactile feedback to facilitate hip extension.  Following litegait training pt ambulates overground with minA at hips to facilitate rotation and lateral weight shifting. Pt ambulates 300'. Step length on R shortened and occasional slight LOB.   Pt perform transfer back to bed with supervision and left supine in bed with alarm intact and all needs within reach.   Therapy Documentation Precautions:  Precautions Precautions: Fall Restrictions Weight Bearing Restrictions: No    Therapy/Group: Individual Therapy  Breck Coons, PT, DPT 10/20/2019, 1:26 PM

## 2019-10-20 NOTE — Progress Notes (Signed)
Physical Therapy Session Note  Patient Details  Name: John Douglas MRN: [id number] Date of Birth: 06-15-1955  Today's Date: 10/20/2019 PT Individual Time: 4327-6147 PT Individual Time Calculation (min): 28 min   Short Term Goals: Week 1:  PT Short Term Goal 1 (Week 1): STGs = LTGS due to ELOS  Skilled Therapeutic Interventions/Progress Updates:    Patient received in bed, pleasant and willing to work with therapy today. Able to complete bed mobility with independence otherwise required only S for functional transfers and gait [id number] with RW during session today. Focused session primarily on balance including tandem stance on and off of blue foam pad, cone taps on solid surface, and horizontal/vertical head turns as well as eyes closed while maintaining static stance on blue foam pad with Min-ModA to maintain upright this afternoon. Left in bed with all needs met, bed alarm active.   Therapy Documentation Precautions:  Precautions Precautions: Fall Restrictions Weight Bearing Restrictions: No Pain: Pain Assessment Pain Scale: 0-10 Pain Score: 0-No pain    Therapy/Group: Individual Therapy   KWindell Norfolk DPT, PN1   Supplemental Physical Therapist [place]   Pager [phone] Rehab Office [phone]   10/20/2019, 3:52 PM

## 2019-10-21 ENCOUNTER — Inpatient Hospital Stay (HOSPITAL_COMMUNITY): Payer: Medicare HMO | Admitting: Occupational Therapy

## 2019-10-21 ENCOUNTER — Inpatient Hospital Stay (HOSPITAL_COMMUNITY): Payer: Medicare HMO

## 2019-10-21 ENCOUNTER — Telehealth: Payer: Self-pay | Admitting: *Deleted

## 2019-10-21 LAB — BASIC METABOLIC PANEL
Anion gap: 10 (ref 5–15)
BUN: 14 mg/dL (ref 8–23)
CO2: 24 mmol/L (ref 22–32)
Calcium: 8.8 mg/dL — ABNORMAL LOW (ref 8.9–10.3)
Chloride: 103 mmol/L (ref 98–111)
Creatinine, Ser: 0.86 mg/dL (ref 0.61–1.24)
GFR calc Af Amer: >60 mL/min (ref >60)
GFR calc non Af Amer: >60 mL/min (ref >60)
Glucose, Bld: 189 mg/dL — ABNORMAL HIGH (ref 70–99)
Potassium: 3.4 mmol/L — ABNORMAL LOW (ref 3.5–5.1)
Sodium: 137 mmol/L (ref 135–145)

## 2019-10-21 LAB — CBC WITH DIFFERENTIAL/PLATELET
Abs Immature Granulocytes: 0.05 10*3/uL (ref 0.00–0.07)
Basophils Absolute: 0 10*3/uL (ref 0.0–0.1)
Basophils Relative: 0 %
Eosinophils Absolute: 0.1 10*3/uL (ref 0.0–0.5)
Eosinophils Relative: 0 %
HCT: 37.6 % — ABNORMAL LOW (ref 39.0–52.0)
Hemoglobin: 12.5 g/dL — ABNORMAL LOW (ref 13.0–17.0)
Immature Granulocytes: 0 %
Lymphocytes Relative: 10 %
Lymphs Abs: 1.2 10*3/uL (ref 0.7–4.0)
MCH: 27.3 pg (ref 26.0–34.0)
MCHC: 33.2 g/dL (ref 30.0–36.0)
MCV: 82.1 fL (ref 80.0–100.0)
Monocytes Absolute: 0.7 10*3/uL (ref 0.1–1.0)
Monocytes Relative: 6 %
Neutro Abs: 10.3 10*3/uL — ABNORMAL HIGH (ref 1.7–7.7)
Neutrophils Relative %: 84 %
Platelets: 256 10*3/uL (ref 150–400)
RBC: 4.58 MIL/uL (ref 4.22–5.81)
RDW: 16.4 % — ABNORMAL HIGH (ref 11.5–15.5)
WBC: 12.3 10*3/uL — ABNORMAL HIGH (ref 4.0–10.5)
nRBC: 0 % (ref 0.0–0.2)

## 2019-10-21 LAB — GLUCOSE, CAPILLARY
Glucose-Capillary: 136 mg/dL — ABNORMAL HIGH (ref 70–99)
Glucose-Capillary: 112 mg/dL — ABNORMAL HIGH (ref 70–99)
Glucose-Capillary: 93 mg/dL (ref 70–99)
Glucose-Capillary: 144 mg/dL — ABNORMAL HIGH (ref 70–99)

## 2019-10-21 MED ORDER — ACETAMINOPHEN 325 MG PO TABS: 325.0000 mg | ORAL_TABLET | ORAL | Status: DC | PRN

## 2019-10-21 NOTE — Progress Notes (Signed)
Occupational Therapy Session Note  Patient Details  Name: John Douglas MRN: [id number] Date of Birth: 1956-01-18  Today's Date: 10/21/2019 OT Individual Time: 4492-0100 and 1329-1430 OT Individual Time Calculation (min): 56 min and 61  min   Short Term Goals: Week 1:  OT Short Term Goal 1 (Week 1): STGs = LTGs d/t short ELOS  Skilled Therapeutic Interventions/Progress Updates:    Pt greeted at time of session sitting up in wheelchair just finishing PT session with daughter and girlfriend in the room for family ed. Pt performed entire ADL routine with family present for education on his L side inattention, transfer techniques, safe use of RW, and discussion for home set up. Pt performed UB/LB bathing and dressing with pt able to retrieve clothing items from dresser, ambulate to bathroom and transfer to TTB with Supervision - Mod I, dried off in the same manner. Ambulated back to his room for dressing where he performed supervision - Mod I. Pt is aware of his L side inattention and attempts to compensate by scanning to L side throughout. Cues provided occasionally for hand/foot placement but pt able to perform well and family present throughout. Pt already has TTB but does want BSC for home use. Discussion on patient not driving and having assistance at home at all times when he initially goes home. Ambulated to apartment and performed TTB transfers in bath tub with family present with Mod I - Supervision and able to manage BLEs over tub wall. Family very supportive. Pt brought back to room via wheelchair for time management with call bell in reach, alarm on, all needs met.   Session 2: Pt greeted at time of session reclined in bed but easily roused. Ambulated throughout room with RW to sink level to perform hygiene/grooming/oral care tasks with set up. Ambulated from room to gym with supervision with RW and performed SCIFIT BUE exercise on level 6 in standing for 12 minutes with 1 rest break half way  through for both NMR of LUE and to improve endurance/activity tolerance given his reports of increased fatigue. Brought to other gym via wheelchair and performed dynamic standing activity with RW for item retrieval of cones in various planes/heights including ground level. Pt did miss one item on his Left side, education on scanning full visual field. Ambulated back to room with supervision with RW and transferred back to bed and sitting to supine with supervision. Pt with call bell in reach, all needs met, alarm on.   Therapy Documentation Precautions:  Precautions Precautions: Fall Restrictions Weight Bearing Restrictions: No    Therapy/Group: Individual Therapy  Viona Gilmore 10/21/2019, 1:43 PM

## 2019-10-21 NOTE — Progress Notes (Signed)
Discussed "rectal bleeding" and concerns of DAPT with patient and GF as well as Neurology. GF reports that patient had significant bleed  They do not want to stop either. His H/H is at baseline--will monitor daily for now. He has had chronic issues with loose, frequent stools--hat placed in commode placed in hat. Had opportunity to see BM later in afternoon--small amount of semi-soft brown stool with lot of dark bloody mucoid drainage.   Neurology feels that Plavix of no benefit as stroke due to A fib and would prefer [facility] but aware that DAPT due to stent. They recommend resuming AC after treatment for rectal cancer.

## 2019-10-21 NOTE — Plan of Care (Signed)
  Problem: RH SAFETY Goal: RH STG ADHERE TO SAFETY PRECAUTIONS W/ASSISTANCE/DEVICE Description: STG Adhere to Safety Precautions With moderate Assistance/Device. Outcome: Progressing   Problem: RH SAFETY Goal: RH STG DECREASED RISK OF FALL WITH ASSISTANCE Description: STG Decreased Risk of Fall With Assistance. Outcome: Progressing

## 2019-10-21 NOTE — Progress Notes (Signed)
Pt having multiple bloody BMs. Pt denies pain with the Bm. Pt also denies being dizzy with ambulation. Will continue to monitor the pts BMs

## 2019-10-21 NOTE — Progress Notes (Signed)
[place] PHYSICAL MEDICINE & REHABILITATION PROGRESS NOTE   Subjective/Complaints:  Pt reports having loose BMs- pt did NOT tell me, but nursing note documented multiple bloody loose BMs over night.   Will call Neuro to see plan about Plavix  ROS:  Pt denies SOB, abd pain, CP, N/V/C/D, and vision changes  Objective:   No results found. No results for input(s): WBC, HGB, HCT, PLT in the last 72 hours. No results for input(s): NA, K, CL, CO2, GLUCOSE, BUN, CREATININE, CALCIUM in the last 72 hours.  Intake/Output Summary (Last 24 hours) at 10/21/2019 0853 Last data filed at 10/21/2019 0746 Gross per 24 hour  Intake 684 ml  Output --  Net 684 ml     Physical Exam: Vital Signs Blood pressure (!) 154/86, pulse 61, temperature 98.4 F (36.9 C), temperature source Oral, resp. rate 18, height 5\' 10"  (1.778 m), weight 94.8 kg, SpO2 99 %. General: awake, alert, appropriate, laying in bed, RN in room, NAD HEENT: conjugate gaze Heart: RRR Chest: CTA B/L- no W/R/R- good air movement Abdomen: Soft, NT, ND, (+)BS   Extremities: No clubbing, cyanosis, or edema. Pulses are 2+ Skin: Clean and intact without signs of breakdown Neuro: Ox3- can remember staff/doctor's names . Impaired delayed recall. Cranial nerves 2-12 are intact. Motor function is grossly 5/5 on right side, 4+/5 left upper extremity and 4-/5 in left lower extremity with ataxia. Performed sit to stand with MinA  Psych: appropriate  Assessment/Plan: 1. Functional deficits secondary to acute ischemic right PCA stroke which require 3+ hours per day of interdisciplinary therapy in a comprehensive inpatient rehab setting.  Physiatrist is providing close team supervision and 24 hour management of active medical problems listed below.  Physiatrist and rehab team continue to assess barriers to discharge/monitor patient progress toward functional and medical goals  Care Tool:  Bathing    Body parts bathed by patient: Right  arm, Left arm, Chest, Abdomen, Front perineal area, Buttocks, Right upper leg, Left upper leg, Right lower leg, Left lower leg, Face         Bathing assist Assist Level: Supervision/Verbal cueing     Upper Body Dressing/Undressing Upper body dressing   What is the patient wearing?: Pull over shirt    Upper body assist Assist Level: Set up assist (ambulated with RW with S to gather clothes)    Lower Body Dressing/Undressing Lower body dressing      What is the patient wearing?: Underwear/pull up, Pants     Lower body assist Assist for lower body dressing: Supervision/Verbal cueing     Toileting Toileting    Toileting assist Assist for toileting: Supervision/Verbal cueing     Transfers Chair/bed transfer  Transfers assist     Chair/bed transfer assist level: Supervision/Verbal cueing     Locomotion Ambulation   Ambulation assist      Assist level: Supervision/Verbal cueing Assistive device: Walker-rolling Max distance: 182ft   Walk 10 feet activity   Assist     Assist level: Supervision/Verbal cueing Assistive device: Walker-rolling   Walk 50 feet activity   Assist    Assist level: Supervision/Verbal cueing Assistive device: Walker-rolling    Walk 150 feet activity   Assist    Assist level: Supervision/Verbal cueing Assistive device: Walker-rolling    Walk 10 feet on uneven surface  activity   Assist     Assist level: Contact Guard/Touching assist Assistive device: Other (comment) (rail)   Wheelchair     Assist Will patient use wheelchair at discharge?:  No             Wheelchair 50 feet with 2 turns activity    Assist            Wheelchair 150 feet activity     Assist          Blood pressure (!) 154/86, pulse 61, temperature 98.4 F (36.9 C), temperature source Oral, resp. rate 18, height 5\' 10"  (1.778 m), weight 94.8 kg, SpO2 99 %.  Medical Problem List and Plan: 1.  Impaired mobility and  ADLs secondary to acute ischemic right PCA stroke.             -patient may shower             -ELOS/Goals: 7 days modI  -Continue CIR 2.  Antithrombotics: -DVT/anticoagulation:  Mechanical: Sequential compression devices, below knee Bilateral lower extremities and TEDs             -antiplatelet therapy: Have restarted Plavix- no more hematochezia.  6/21- no bleeding- con't Plavix  3. Pain Management: Will add low dose gabapentin and change robaxin to baclofen prn  muscle spasms. Currently well controlled.  4. Mood: LCSW to follow for evaluation and support.              -antipsychotic agents: N/A 5. Neuropsych: This patient is capable of making decisions on his own behalf. 6. Skin/Wound Care: Routine pressure relief measures. 7. Fluids/Electrolytes/Nutrition: Monitor intake/output.  Offer supplements as needed Po intake 8.  CAD s/p PCI to RCA 12/20: On DAPT and Crestor.  9.  T2DM: Hemoglobin A1c 8.0.  Blood sugars ac/hs--has been off meds X 1 year due to improvement in A1c-5.4 (loss weight and dieting).   CBGs have been well controlled. CBG (last 3)  Recent Labs    10/20/19 2046 10/20/19 2141 10/21/19 0617  GLUCAP 174* 149* 136*    6/21- BGs 160s to 220s-  Continues ot be elevated- will add Amaryl 2 mg daily.   6/22- slightly improved once got Amaryl this AM  6/23- BGs much better 136-175- down from mid 200s- cont Amaryl 10.  Hematochezia,/iron deficiency anemia: Stable 11.  Hypokalemia: Stable 12.  Stage IV colon cancer: Monitor for signs of bleeding is now back on DAPT.  6/19: had large amount of bloody stools. Held plavix. CBC repeated today and Hgb stable. Had small bloody stools today. Continue to monitor.   6/20: Plavix restarted. See #2  6/22- no rectal bleeding documented.   6/23- was documented last night had multiple bloody BMs last night- no other documentation about clots, color, etc were documented. Checking CBC to see if drop in Hb.  13.  PAF: Monitor heart rate 3  times daily.  Continue metoprolol twice daily. Currently well controlled.   6/21- in RRR currently- con't meds 14.  HTN: Monitor blood pressures 3 times daily.  Continue low-dose lisinopril as well as metoprolol. Labile: continue to monitor.  15. Situational anxiety: Would like to be started on something. Will start Celexa. Continue Klonopin prn has being effective  6/23- pt reports working better 16. Insomnia: slept better last night.  17. Leukocytosis: normalized.  18. Dispo  6/22- team conference today- d/c planned for Friday   LOS: 6 days A FACE TO FACE EVALUATION WAS PERFORMED  Emiley Digiacomo 10/21/2019, 8:53 AM

## 2019-10-21 NOTE — Telephone Encounter (Signed)
Message received from patient's daughter, John Douglas wanting to know if pt should receive the Covid-19 vaccination.  Call placed back to patient's daughter John Douglas and message left to notify her per order of Dr. Marin Olp that pt should receive the Covid-19 vaccination.  Instructed pt.'s daughter to call office back with any questions or concerns.

## 2019-10-21 NOTE — Progress Notes (Signed)
Physical Therapy Session Note  Patient Details  Name: John Douglas MRN: [id number] Date of Birth: Sep 22, 1955  Today's Date: 10/21/2019 PT Individual Time: 1008-1101 PT Individual Time Calculation (min): 53 min   Short Term Goals: Week 1:  PT Short Term Goal 1 (Week 1): STGs = LTGS due to ELOS  Skilled Therapeutic Interventions/Progress Updates:     Pt received supine in bed and agreeable to therapy. No report of pain. Pt's girlfriend and daughter present for family education. Bed mobility independent. Sit to stand supervision with RW, cues for hand placement and body mechanics. PT educates family on proper guarding while ambulating with pt and need for pt to ambulate with RW while balance still impaired as well as L inattention. Pt ambulates 150' with RW and verbal cues for upright gaze, hand placement, proximity to RW and increasing gait speed for decreased risk for falls.   Pt performs car transfer with verbal cues on sequencing and hand placement. Following transfer pt ambulates up and down ramp with cues for increased RLE dorsiflexion to prevent toe drag. Pt performs dynavision board standing without UE support, demonstrating slightly impaired attention and reaction time toward targets in L lateral visual field.  Ambulateion 120' to therapy gym with RW. Pt performs x4 steps with BHRs and CGA with PT, x4 with supervision from girlfriend, and x4 with RHR and CGA from PT. PT educates on stair training technique and positioning for family member for safety.  Pt ambulates 120' x2 to and from restroom and performs toilet transfer with supervision. Pt then performs standing balance re-ed, with PT blocking LLE and pt performing targeted stepping with RLE, then alternating and performing targeted stepping with LLE. Pt requires CGA for majority of activity but occasional modA to prevent total LOB. Ambulates 120' back to room without AD and with CGA from PT. No LOBs.  Pt left seated in WC with all  needs within reach. Handoff to OT.  Therapy Documentation Precautions:  Precautions Precautions: Fall Restrictions Weight Bearing Restrictions: No    Therapy/Group: Individual Therapy  Breck Coons, PT, DPT 10/21/2019, 10:47 AM

## 2019-10-21 NOTE — Progress Notes (Signed)
Patient ID: John Douglas, male   DOB: 12-30-1955, 64 y.o.   MRN: [id number]   DME order: 3in1 BSC and RW sent to [employer] [facility] via parachute.   Outpatient PT/OT/ST referral faxed to [facility] (p:[phone]/f:[phone]).   Loralee Pacas, MSW, [place] Office: [phone] Cell: [phone] Fax: [phone]

## 2019-10-22 ENCOUNTER — Inpatient Hospital Stay (HOSPITAL_COMMUNITY): Payer: Medicare HMO | Admitting: Occupational Therapy

## 2019-10-22 ENCOUNTER — Inpatient Hospital Stay (HOSPITAL_COMMUNITY): Payer: Medicare HMO

## 2019-10-22 ENCOUNTER — Inpatient Hospital Stay (HOSPITAL_COMMUNITY): Payer: Medicare HMO | Admitting: Physical Therapy

## 2019-10-22 LAB — GLUCOSE, CAPILLARY
Glucose-Capillary: 132 mg/dL — ABNORMAL HIGH (ref 70–99)
Glucose-Capillary: 103 mg/dL — ABNORMAL HIGH (ref 70–99)
Glucose-Capillary: 110 mg/dL — ABNORMAL HIGH (ref 70–99)
Glucose-Capillary: 145 mg/dL — ABNORMAL HIGH (ref 70–99)

## 2019-10-22 LAB — CBC
HCT: 38.2 % — ABNORMAL LOW (ref 39.0–52.0)
Hemoglobin: 12.7 g/dL — ABNORMAL LOW (ref 13.0–17.0)
MCH: 27.7 pg (ref 26.0–34.0)
MCHC: 33.2 g/dL (ref 30.0–36.0)
MCV: 83.4 fL (ref 80.0–100.0)
Platelets: 263 10*3/uL (ref 150–400)
RBC: 4.58 MIL/uL (ref 4.22–5.81)
RDW: 16.5 % — ABNORMAL HIGH (ref 11.5–15.5)
WBC: 9.8 10*3/uL (ref 4.0–10.5)
nRBC: 0 % (ref 0.0–0.2)

## 2019-10-22 NOTE — Progress Notes (Signed)
Physical Therapy Session Note  Patient Details  Name: John Douglas MRN: [id number] Date of Birth: Jun 09, 1955  Today's Date: 10/22/2019 PT Individual Time: 2341-4436 PT Individual Time Calculation (min): 58 min   Short Term Goals: Week 1:  PT Short Term Goal 1 (Week 1): STGs = LTGS due to ELOS  Skilled Therapeutic Interventions/Progress Updates:   Pt received supine in bed and agreeable to PT. Supine>sit transfer with out assist and or cues. Pt performed stand pivot transfer to [facility] without Assist from PT and UE support on RW. Pt transported to entrance to [facility]. Pt then performed gait training with RW over unlevel cement side walk with distant supervision assist from PT 233f, [id number] [id number] Min cues for safety with AD management intermittently to prevent Loss of control when navigated curb. Pt then performed gait training around [facility] [address] [facility] with Rw and no cues or assist from PT x [id number] returned to room and performed stand pivot transfer to bed without assist. Sit>supine completed without assist, and left supine in bed with call bell in reach and all needs met.        Therapy Documentation Precautions:  Precautions Precautions: Fall Restrictions Weight Bearing Restrictions: No    Vital Signs: Therapy Vitals Temp: 98.3 F (36.8 C) Pulse Rate: 63 Resp: 16 BP: 125/71 Patient Position (if appropriate): Lying Oxygen Therapy SpO2: 97 % O2 Device: Room Air Pain: Pain Assessment Pain Scale: 0-10 Pain Score: 0-No pain    Therapy/Group: Individual Therapy  AustLorie Phenix4/2021, 5:00 PM

## 2019-10-22 NOTE — Progress Notes (Signed)
Occupational Therapy Discharge Summary  Patient Details  Name: John Douglas MRN: [id number] Date of Birth: [birth year]  Today's Date: 10/22/2019 OT Individual Time: 1461-9689 OT Individual Time Calculation (min): 64 min    Patient has met 7 of 10 long term goals due to improved activity tolerance, improved balance, ability to compensate for deficits, improved attention, improved awareness and improved coordination.  Patient to discharge at overall Supervision - Mod I level.  Patient's care partner is independent to provide the necessary physical assistance at discharge.    Reasons goals not met: OT goals not met for LB dressing and functional transfers to various ADL surfaces as the patient is Supervision for transfers with RW d/t L side inattention.   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  Equipment: TTB (family providing), RW, BSC  Reasons for discharge: discharge from hospital and progress toward OT goals  Patient/family agrees with progress made and goals achieved: Yes   Skilled Interventions:  Pt greeted at time of session reclined in bed sleeping but easily roused and agreeable to OT session to address ADLs prior to DC home with family assistance. Pt ambulated throughout his room to collect clothing items from dresser drawers and placed items on bed, ambulated to bathroom and performed TTB transfer Supervision with RW, Mod I with UB/LB bathing as he is able to perform all bathing tasks seated and lateral leans for washing buttocks. Pt dried off in the same manner and donned underwear with supervision before ambulating to toilet where he was Mod I with donning/doffing clothing and hygiene but was supervision for transfer. Once finished and ambulated back to bed surface, pt completed UB dressing Indep and LB  dressing supervision only when standing to don over hips. Patient ed on continuing to attend to his L side to decrease any inattention, plans for his day on returning home, family support, and any last minute concerns. Pt returned to supine Indep and call bell in reach, alarm on, all needs met.  OT Discharge Precautions/Restrictions  Precautions Precautions: Fall Pain Pain Assessment Pain Scale: 0-10 Pain Score: 0-No pain ADL ADL Grooming: Supervision/safety Upper Body Bathing: Supervision/safety Where Assessed-Upper Body Bathing: Shower Lower Body Bathing: Supervision/safety Where Assessed-Lower Body Bathing: Shower Upper Body Dressing: Setup Where Assessed-Upper Body Dressing: Edge of bed Lower Body Dressing: Supervision/safety Where Assessed-Lower Body Dressing: Edge of bed Toileting: Supervision/safety Where Assessed-Toileting: Teacher, adult education: Close supervision Toilet Transfer Method: Insurance claims handler: Close supervison Web designer Method: Ship broker: Emergency planning/management officer ADL Comments: CGA-Supervision for all ADLs Vision Baseline Vision/History: Wears glasses Wears Glasses: Reading only Visual Fields:  (mild L side inattention) Cognition Overall Cognitive Status: Within Functional Limits for tasks assessed Arousal/Alertness: Awake/alert Attention: Focused;Sustained Safety/Judgment: Materials engineer - Discharge Observations: mild LLE weakness/decreased [facility] Trunk/Postural Assessment  Cervical Assessment Cervical Assessment: Within Functional Limits Thoracic Assessment Thoracic Assessment: Exceptions to [place] (mildly rounded shoulders) Lumbar Assessment Lumbar Assessment: Within Functional Limits Postural Control Postural Control: Within Functional Limits  Balance Balance Balance Assessed: Yes Static Sitting Balance Static Sitting - Level of Assistance: 7: Independent Dynamic Sitting  Balance Dynamic Sitting - Level of Assistance: 7: Independent Static Standing Balance Static Standing - Level of Assistance: 6: Modified independent (Device/Increase time) Dynamic Standing Balance Dynamic Standing - Level of Assistance: 5: Stand by assistance Extremity/Trunk Assessment   LUE  Assessment LUE Assessment: Within Functional Limits    Viona Gilmore 10/22/2019, 10:18 AM

## 2019-10-22 NOTE — Progress Notes (Signed)
[facility] [place] PHYSICAL MEDICINE & REHABILITATION PROGRESS NOTE   Subjective/Complaints:  Pt reports having a little blood on toilet tissue, but not a lot in stool, per him.   Is better than yesterday.  Ready to d/c tomorrow.   ROS:  Pt denies SOB, abd pain, CP, N/V/C/D, and vision changes  Objective:   No results found. Recent Labs    10/21/19 0936 10/22/19 0450  WBC 12.3* 9.8  HGB 12.5* 12.7*  HCT 37.6* 38.2*  PLT 256 263   Recent Labs    10/21/19 0936  NA 137  K 3.4*  CL 103  CO2 24  GLUCOSE 189*  BUN 14  CREATININE 0.86  CALCIUM 8.8*    Intake/Output Summary (Last 24 hours) at 10/22/2019 0924 Last data filed at 10/22/2019 0733 Gross per 24 hour  Intake 735 ml  Output --  Net 735 ml     Physical Exam: Vital Signs Blood pressure 132/81, pulse 64, temperature 98.4 F (36.9 C), resp. rate 17, height 5\' 10"  (1.778 m), weight 94.8 kg, SpO2 96 %. General: awake, alert, appropriate, lying in bed, NAD HEENT: conjugate gaze Heart: RRR Chest: CTA B/L- no W/R/R- good air movement Abdomen: Soft, NT, ND, (+)BS   Extremities: No clubbing, cyanosis, or edema. Pulses are 2+ Skin: Clean and intact without signs of breakdown Neuro: Ox3- delayed recall still . Cranial nerves 2-12 are intact. Motor function is grossly 5/5 on right side, 4+/5 left upper extremity and 4-/5 in left lower extremity with ataxia. Performed sit to stand with MinA  Psych: appropriate  Assessment/Plan: 1. Functional deficits secondary to acute ischemic right PCA stroke which require 3+ hours per day of interdisciplinary therapy in a comprehensive inpatient rehab setting.  Physiatrist is providing close team supervision and 24 hour management of active medical problems listed below.  Physiatrist and rehab team continue to assess barriers to discharge/monitor patient progress toward functional and medical goals  Care Tool:  Bathing    Body parts bathed by patient: Right arm, Left arm, Chest,  Abdomen, Front perineal area, Buttocks, Right upper leg, Left upper leg, Right lower leg, Left lower leg, Face         Bathing assist Assist Level: Set up assist     Upper Body Dressing/Undressing Upper body dressing   What is the patient wearing?: Pull over shirt    Upper body assist Assist Level: Set up assist    Lower Body Dressing/Undressing Lower body dressing      What is the patient wearing?: Underwear/pull up, Pants     Lower body assist Assist for lower body dressing: Supervision/Verbal cueing     Toileting Toileting    Toileting assist Assist for toileting: Supervision/Verbal cueing     Transfers Chair/bed transfer  Transfers assist     Chair/bed transfer assist level: Supervision/Verbal cueing     Locomotion Ambulation   Ambulation assist      Assist level: Supervision/Verbal cueing Assistive device: Walker-rolling Max distance: 162ft   Walk 10 feet activity   Assist     Assist level: Supervision/Verbal cueing Assistive device: Walker-rolling   Walk 50 feet activity   Assist    Assist level: Supervision/Verbal cueing Assistive device: Walker-rolling    Walk 150 feet activity   Assist    Assist level: Supervision/Verbal cueing Assistive device: Walker-rolling    Walk 10 feet on uneven surface  activity   Assist     Assist level: Supervision/Verbal cueing Assistive device: Chemical engineer  Assist Will patient use wheelchair at discharge?: No             Wheelchair 50 feet with 2 turns activity    Assist            Wheelchair 150 feet activity     Assist          Blood pressure 132/81, pulse 64, temperature 98.4 F (36.9 C), resp. rate 17, height 5\' 10"  (1.778 m), weight 94.8 kg, SpO2 96 %.  Medical Problem List and Plan: 1.  Impaired mobility and ADLs secondary to acute ischemic right PCA stroke.             -patient may shower             -ELOS/Goals: 7 days  modI  -Continue CIR 2.  Antithrombotics: -DVT/anticoagulation:  Mechanical: Sequential compression devices, below knee Bilateral lower extremities and TEDs             -antiplatelet therapy: Have restarted Plavix- no more hematochezia.  6/21- no bleeding- con't Plavix   6/24- con't Plavix- Hb hasn't dropped with rectal bleeding 3. Pain Management: Will add low dose gabapentin and change robaxin to baclofen prn  muscle spasms. Currently well controlled.  4. Mood: LCSW to follow for evaluation and support.              -antipsychotic agents: N/A 5. Neuropsych: This patient is capable of making decisions on his own behalf. 6. Skin/Wound Care: Routine pressure relief measures. 7. Fluids/Electrolytes/Nutrition: Monitor intake/output.  Offer supplements as needed Po intake 8.  CAD s/p PCI to RCA 12/20: On DAPT and Crestor.  9.  T2DM: Hemoglobin A1c 8.0.  Blood sugars ac/hs--has been off meds X 1 year due to improvement in A1c-5.4 (loss weight and dieting).   CBGs have been well controlled. CBG (last 3)  Recent Labs    10/21/19 1646 10/21/19 2048 10/22/19 0607  GLUCAP 93 144* 132*    6/21- BGs 160s to 220s-  Continues ot be elevated- will add Amaryl 2 mg daily.   6/22- slightly improved once got Amaryl this AM  6/23- BGs much better 136-175- down from mid 200s- cont Amaryl  6/24- BGs 93-144- much improved- con't Amaryl  10.  Hematochezia,/iron deficiency anemia: Stable 11.  Hypokalemia: Stable 12.  Stage IV colon cancer: Monitor for signs of bleeding is now back on DAPT.  6/19: had large amount of bloody stools. Held plavix. CBC repeated today and Hgb stable. Had small bloody stools today. Continue to monitor.   6/20: Plavix restarted. See #2  6/22- no rectal bleeding documented.   6/23- was documented last night had multiple bloody BMs last night- no other documentation about clots, color, etc were documented. Checking CBC to see if drop in Hb.   6/24- No drop in Hb- 12.7- so con't  Plavix- Neuro wants pt on Eliquis, but cannot do until tumor treated since had problems in past with Eliquis.   13.  PAF: Monitor heart rate 3 times daily.  Continue metoprolol twice daily. Currently well controlled.   6/21- in RRR currently- con't meds 14.  HTN: Monitor blood pressures 3 times daily.  Continue low-dose lisinopril as well as metoprolol. Labile: continue to monitor.  15. Situational anxiety: Would like to be started on something. Will start Celexa. Continue Klonopin prn has being effective  6/23- pt reports working better 16. Insomnia: slept better last night.  17. Leukocytosis: normalized.  18. Dispo  6/22- team conference today- d/c planned  for Friday   LOS: 7 days A FACE TO FACE EVALUATION WAS PERFORMED  Wanya Bangura 10/22/2019, 9:24 AM

## 2019-10-22 NOTE — Progress Notes (Signed)
Physical Therapy Session Note  Patient Details  Name: John Douglas MRN: [id number] Date of Birth: [birth year]  Today's Date: 10/22/2019 PT Individual Time: 1109-1208 PT Individual Time Calculation (min): 59 min   Short Term Goals: Week 1:  PT Short Term Goal 1 (Week 1): STGs = LTGS due to ELOS  Skilled Therapeutic Interventions/Progress Updates:   Pt received supine in bed and agreeable to therapy session. Supine<>sit independently. Patient verbalizes understanding of needing to use RW for all standing/ambulating tasks. Sit<>stands using RW mod-I during session. Gait ~143ft to ortho gym using RW mod-I. Ambulatory car transfer using RW (small SUV height) with supervision. Gait ~66ft up/dwon ramp using RW with supervision. Ascended/descended 12 steps using B HRs with close supervision for safety - pt verbalized and demonstrated safe technique of step-to pattern leading with R LE on ascent and L LE on descent. Educated pt on proper AD management when performing curb step and pt demonstrated understanding on/off 5" curb using RW x2 with supervision for safety. Patient reports that he likes to do some lower back stretches lying on the floor at home. Educated pt on safe floor transfer technique and pt demonstrated understanding with supervision for safety - educated pt on having family supervise him with this task and using low couch when performing floor transfer at home.  Performed the following exercises and created pt's HEP, printout provided:  - repeated sit<>stands to/from chair with hand supports on knees x10 reps  - standing at counter hamstring curls x 15 reps - required cuing to target hamstrings and not over activation of hip flexors  - standing at counter heel raises x15 reps  - lateral side stepping at counter - walking daily with RW Therapist educated pt on importance of having his family present when performing exercises to ensure his safety. Therapist provided patient with walker bag to  store items allowing B hand support on AD during standing/ambulating. Gait ~154ft back to room using RW mod-I. Pt left sitting EOB with needs in reach and bed alarm on.  Therapy Documentation Precautions:  Precautions Precautions: Fall Restrictions Weight Bearing Restrictions: No  Pain: Denies pain during session.     Therapy/Group: Individual Therapy  Tawana Scale, PT, DPT 10/22/2019, 8:02 AM

## 2019-10-22 NOTE — Progress Notes (Signed)
Occupational Therapy Session Note  Patient Details  Name: John Douglas MRN: [id number] Date of Birth: 1955-07-16  Today's Date: 10/22/2019 OT Individual Time: 1430-1500 OT Individual Time Calculation (min): 30 min    Short Term Goals: Week 1:  OT Short Term Goal 1 (Week 1): STGs = LTGs d/t short ELOS  Skilled Therapeutic Interventions/Progress Updates:    1:1.Pt received in bed agreeable to OT. Pt requires increased time to stretch and getting OOB MOD I and transfer to toilet at ambulatory level with MOD I. VC for washing hands after toileting with small BM noted. Pt completes biodex with CGA for facilitation of weight shift with difficulty mostly left/backwards. Exited session with pt seated in bed,e xit alarm on and call light in reach  Therapy Documentation Precautions:  Precautions Precautions: Fall Restrictions Weight Bearing Restrictions: No General:   Vital Signs: Therapy Vitals Temp: 98.3 F (36.8 C) Pulse Rate: 63 Resp: 16 BP: 125/71 Patient Position (if appropriate): Lying Oxygen Therapy SpO2: 97 % O2 Device: Room Air Pain: Pain Assessment Pain Scale: 0-10 Pain Score: 0-No pain ADL: ADL Grooming: Supervision/safety Upper Body Bathing: Supervision/safety Where Assessed-Upper Body Bathing: Shower Lower Body Bathing: Supervision/safety Where Assessed-Lower Body Bathing: Shower Upper Body Dressing: Setup Where Assessed-Upper Body Dressing: Edge of bed Lower Body Dressing: Supervision/safety Where Assessed-Lower Body Dressing: Edge of bed Toileting: Supervision/safety Where Assessed-Toileting: Glass blower/designer: Close supervision Toilet Transfer Method: Human resources officer: Close supervison Clinical cytogeneticist Method: Optometrist: Radio broadcast assistant ADL Comments: CGA-Supervision for all ADLs Vision   Perception  Perception: Impaired Inattention/Neglect: Other (comment) (per PT notes pt noted to have either slight  L inattention or impaired L peripheral vision during dynavision task) Praxis Praxis: Intact Exercises:   Other Treatments:     Therapy/Group: Individual Therapy  Tonny Branch 10/22/2019, 3:01 PM

## 2019-10-22 NOTE — Plan of Care (Signed)
  Problem: RH SAFETY Goal: RH STG ADHERE TO SAFETY PRECAUTIONS W/ASSISTANCE/DEVICE Description: STG Adhere to Safety Precautions With moderate Assistance/Device. Outcome: Progressing

## 2019-10-22 NOTE — Progress Notes (Signed)
Physical Therapy Discharge Summary  Patient Details  Name: John Douglas MRN: [id number] Date of Birth: August 10, 1955  Patient has met 9 of 9 long term goals due to improved activity tolerance, improved balance, improved postural control, increased strength, functional use of  left lower extremity, improved attention, improved awareness and improved coordination.  Patient to discharge at an ambulatory level Modified Independent using RW. Patient's care partner attended hands-on education/training and is independent to provide the necessary physical assistance at discharge.  All goals met.  Recommendation:  Patient will benefit from ongoing skilled PT services in outpatient setting to continue to advance safe functional mobility, address ongoing impairments in dynamic standing balance, dynamic gait with LRAD, L LE coordination, L LE strength, L attention, and minimize fall risk.  Equipment: RW  Reasons for discharge: treatment goals met and discharge from hospital  Patient/family agrees with progress made and goals achieved: Yes  PT Discharge Precautions/Restrictions Precautions Precautions: Fall Restrictions Weight Bearing Restrictions: No Pain Pain Assessment Pain Scale: 0-10 Pain Score: 0-No pain Perception  Perception Perception: Impaired Inattention/Neglect: Other (comment) (per PT notes pt noted to have either slight L inattention or impaired L peripheral vision during dynavision task) Praxis Praxis: Intact  Cognition Overall Cognitive Status: Within Functional Limits for tasks assessed Arousal/Alertness: Awake/alert Orientation Level: Oriented X4 Attention: Focused;Sustained Focused Attention: Appears intact Sustained Attention: Appears intact Safety/Judgment: Appears intact Sensation Sensation Light Touch: Appears Intact Hot/Cold: Not tested Stereognosis: Not tested Coordination Gross Motor Movements are Fluid and Coordinated: No Coordination and Movement  Description: continues to demonstrate slight L LE impaired coordination during functional mobility Heel Shin Test: Decreased coordination L LE compared to R LE Motor  Motor Motor: Hemiplegia;Other (comment) Motor - Discharge Observations: mild LLE weakness/decreased Lynch  Mobility Bed Mobility Bed Mobility: Supine to Sit;Sit to Supine Supine to Sit: Independent Sit to Supine: Independent Transfers Transfers: Sit to Stand;Stand to [employer] Transfers Sit to Stand: Independent with assistive device;Independent Stand to Sit: Independent;Independent with assistive device Stand Pivot Transfers: Independent with assistive device Transfer (Assistive device): Rolling walker Locomotion  Gait Ambulation: Yes Gait Assistance: Independent with assistive device Gait Distance (Feet): 150 Feet Assistive device: Rolling walker Gait Gait: Yes Gait Pattern: Within Functional Limits Stairs / Additional Locomotion Stairs: Yes Stairs Assistance: Supervision/Verbal cueing Stair Management Technique: Two rails Number of Stairs: 12 Height of Stairs: 6 Ramp: Supervision/Verbal cueing (using RW) Curb: Supervision/Verbal cueing (using RW) Wheelchair Mobility Wheelchair Mobility: No  Trunk/Postural Assessment  Cervical Assessment Cervical Assessment: Within Functional Limits Thoracic Assessment Thoracic Assessment: Exceptions to [facility] (mild rounded shoulders) Lumbar Assessment Lumbar Assessment: Within Functional Limits Postural Control Postural Control: Within Functional Limits  Balance Balance Balance Assessed: Yes Static Sitting Balance Static Sitting - Level of Assistance: 7: Independent Dynamic Sitting Balance Dynamic Sitting - Level of Assistance: 7: Independent Static Standing Balance Static Standing - Level of Assistance: 6: Modified independent (Device/Increase time) Dynamic Standing Balance Dynamic Standing - Level of Assistance: 5: Stand by assistance Extremity Assessment       RLE Assessment RLE Assessment: Within Functional Limits General Strength Comments: 5/5 LLE Assessment LLE Assessment: Exceptions to [facility] General Strength Comments: assessed in sitting LLE Strength Left Hip Flexion: 4/5 Left Knee Flexion: 4/5 Left Knee Extension: 4+/5 Left Ankle Dorsiflexion: 4+/5 Left Ankle Plantar Flexion: 4+/5    Florencio Hollibaugh M Barett Whidbee, PT, DPT 10/22/2019, 8:01 AM

## 2019-10-23 LAB — CBC
HCT: 39.8 % (ref 39.0–52.0)
Hemoglobin: 13.1 g/dL (ref 13.0–17.0)
MCH: 27.6 pg (ref 26.0–34.0)
MCHC: 32.9 g/dL (ref 30.0–36.0)
MCV: 83.8 fL (ref 80.0–100.0)
Platelets: 298 10*3/uL (ref 150–400)
RBC: 4.75 MIL/uL (ref 4.22–5.81)
RDW: 16.4 % — ABNORMAL HIGH (ref 11.5–15.5)
WBC: 8.6 10*3/uL (ref 4.0–10.5)
nRBC: 0 % (ref 0.0–0.2)

## 2019-10-23 LAB — BASIC METABOLIC PANEL
Anion gap: 10 (ref 5–15)
BUN: 17 mg/dL (ref 8–23)
CO2: 25 mmol/L (ref 22–32)
Calcium: 9.1 mg/dL (ref 8.9–10.3)
Chloride: 106 mmol/L (ref 98–111)
Creatinine, Ser: 0.96 mg/dL (ref 0.61–1.24)
GFR calc Af Amer: >60 mL/min (ref >60)
GFR calc non Af Amer: >60 mL/min (ref >60)
Glucose, Bld: 138 mg/dL — ABNORMAL HIGH (ref 70–99)
Potassium: 3.7 mmol/L (ref 3.5–5.1)
Sodium: 141 mmol/L (ref 135–145)

## 2019-10-23 LAB — GLUCOSE, CAPILLARY: Glucose-Capillary: 125 mg/dL — ABNORMAL HIGH (ref 70–99)

## 2019-10-23 MED ORDER — CLONAZEPAM 1 MG PO TBDP: 1.0000 mg | ORAL_TABLET | Freq: Two times a day (BID) | ORAL | 0 refills | Status: DC | PRN

## 2019-10-23 MED ORDER — GABAPENTIN 100 MG PO CAPS: 100.0000 mg | ORAL_CAPSULE | Freq: Every day | ORAL | 0 refills | Status: DC

## 2019-10-23 MED ORDER — BACLOFEN 5 MG PO TABS: 5.0000 mg | ORAL_TABLET | Freq: Three times a day (TID) | ORAL | 0 refills | Status: DC | PRN

## 2019-10-23 MED ORDER — CITALOPRAM HYDROBROMIDE 10 MG PO TABS: 5.0000 mg | ORAL_TABLET | Freq: Every day | ORAL | 0 refills | Status: DC

## 2019-10-23 MED ORDER — GLUCOSE BLOOD VI STRP: ORAL_STRIP | 12 refills | Status: DC

## 2019-10-23 MED ORDER — GLIMEPIRIDE 2 MG PO TABS: 2.0000 mg | ORAL_TABLET | Freq: Every day | ORAL | 0 refills | Status: DC

## 2019-10-23 MED ORDER — CLOPIDOGREL BISULFATE 75 MG PO TABS
75.0000 mg | ORAL_TABLET | Freq: Every day | ORAL | 0 refills | Status: DC
Start: 2019-10-23 — End: 2020-02-24

## 2019-10-23 MED ORDER — LISINOPRIL 2.5 MG PO TABS: 2.5000 mg | ORAL_TABLET | Freq: Every day | ORAL | 0 refills | Status: DC

## 2019-10-23 MED ORDER — TRAZODONE HCL 50 MG PO TABS: 50.0000 mg | ORAL_TABLET | Freq: Every evening | ORAL | 0 refills | Status: DC | PRN

## 2019-10-23 MED ORDER — ROSUVASTATIN CALCIUM 20 MG PO TABS: 20.0000 mg | ORAL_TABLET | Freq: Every day | ORAL | 0 refills | Status: DC

## 2019-10-23 MED ORDER — ONETOUCH ULTRASOFT LANCETS MISC
12 refills | Status: DC
Start: 2019-10-23 — End: 2023-01-10

## 2019-10-23 MED ORDER — METOPROLOL TARTRATE 50 MG PO TABS: 75.0000 mg | ORAL_TABLET | Freq: Two times a day (BID) | ORAL | 0 refills | Status: DC

## 2019-10-23 NOTE — Progress Notes (Signed)
Inpatient Rehabilitation Care Coordinator  Discharge Note  The overall goal for the admission was met for:   Discharge location: Yes. Pt d/c to home with support from his daughter .   Length of Stay: Yes. 7 days.   Discharge activity level: Yes. Supervision.  Home/community participation: Yes. Limited.   Services provided included: MD, RD, PT, OT, SLP, RN, CM, TR, Pharmacy, Neuropsych and SW  Financial Services: Private Insurance: [employer] Medicare  Follow-up services arranged: Outpatient: [facility] for PT/OT/ST and DME: [facility] and RW  Comments (or additional information): contact pt [phone] or dtr [name] [phone]  Patient/Family verbalized understanding of follow-up arrangements: Yes  Individual responsible for coordination of the follow-up plan: Pt to have assistance with coordinating care needs.   Confirmed correct DME delivered: Rana Snare 10/23/2019    Rana Snare

## 2019-10-23 NOTE — Discharge Summary (Signed)
Physician Discharge Summary  Patient ID: John Douglas MRN: [id number] DOB/AGE: 1955/10/27 64 y.o.  Admit date: 10/15/2019 Discharge date: 10/23/2019  Discharge Diagnoses:  Principal Problem:   Acute ischemic right PCA stroke [facility]) Active Problems:   Paroxysmal atrial fibrillation (HCC)   Essential hypertension   Ischemic cardiomyopathy   Rectal bleeding   Colon cancer (HCC)   Generalized anxiety disorder   Diabetes mellitus ([place])   Discharged Condition: stable   Significant Diagnostic Studies: N/A   Labs:  Basic Metabolic Panel: BMP Latest Ref Rng & Units 10/23/2019 10/21/2019 10/16/2019  Glucose 70 - 99 mg/dL 138(H) 189(H) 146(H)  BUN 8 - 23 mg/dL 17 14 15   Creatinine 0.61 - 1.24 mg/dL 0.96 0.86 1.08  BUN/Creat Ratio 10 - 24 - - -  Sodium 135 - 145 mmol/L 141 137 138  Potassium 3.5 - 5.1 mmol/L 3.7 3.4(L) 3.7  Chloride 98 - 111 mmol/L 106 103 104  CO2 22 - 32 mmol/L 25 24 25   Calcium 8.9 - 10.3 mg/dL 9.1 8.8(L) 9.1    CBC: Recent Labs  Lab 10/21/19 0936 10/22/19 0450 10/23/19 0520  WBC 12.3* 9.8 8.6  NEUTROABS 10.3*  --   --   HGB 12.5* 12.7* 13.1  HCT 37.6* 38.2* 39.8  MCV 82.1 83.4 83.8  PLT 256 263 298    CBG: Recent Labs  Lab 10/22/19 0607 10/22/19 1226 10/22/19 1551 10/22/19 2102 10/23/19 0608  GLUCAP 132* 103* 110* 145* 125*    Brief HPI:   John Douglas is a 64 y.o. male with history of CAD, HTN, T2DM, rectal bleeding and off ASA/Plavix X one week for colonoscopy and biopsy of rectal mass on 10/07/19. He was admitted on 10/08/19 with LLE weakness and unsteadiness as well as difficulty walking.  He also reported dizziness and weakness that had started a couple days before and felt to be due to colonoscopy prep.  Colon mass positive for adenocarcinoma.  He was found to have A. fib with RVR and CT of head done revealing right PCA infarct with petechial hemorrhage.  Dr. Leonie Man felt the stroke was embolic due to A. fib as well as question of  hypercoagulopathy due to new diagnosis of cancer.  ASA was resumed on 06/11 and Dr. Vicente Males was consulted for input.  CT abdomen/pelvis/chest done revealing hyperenhancing focus in left hepatic lobe felt to be hemangioma.  MRI liver negative for metastatic disease.  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  CEA elevated and tumor markers pending.  Patient wanted to hold off on Port-A-Cath placement until after his July 4 beach trip.  Dr. Donne Hazel was consulted for input and recommended chemo as well as follow-up in office for input.  Patient was infused with Feraheme due to low iron stores.  Trace amount of blood noted in BM on 06/16.  Plavix was resumed at discharged with recommendations to monitor for rectal bleeding.  Therapy evaluations revealed functional decline and CIR was recommended for follow-up therapy   Hospital Course: Kiandre Spagnolo was admitted to rehab 10/15/2019 for inpatient therapies to consist of PT and OT at least three hours five days a week. Past admission physiatrist, therapy team and rehab RN have worked together to provide customized collaborative inpatient rehab.  His diabetes has been monitored with a [facility] CBG checks.  Amaryl was added due to elevated A1c and blood sugars are improving.  Patient advised to continue monitoring blood sugars 4 times daily at discharge.  P.o. intake has been good and he is continent of bowel and bladder.  Transient hypokalemia has resolved with supplementation.  He did report issues with anxiety that were exacerbated with his current situation.  He was started on low-dose Celexa and has been tolerating this without difficulty.  Klonopin was also added to be used on as needed basis for elevated levels of anxiety.  He has had issues with LLE spasms and baclofen has been used on as needed basis.  He was maintained on ASA and Plavix during his stay.  Follow-up CBC at admission showed hemoglobin at 12.0.  He  has had recurrent episodes of rectal bleeding and serial CBC showed H&H to be stable and has been improving.  Reactive leukocytosis has resolved.  Blood pressures have been monitored on 3 times daily basis and have been stable.  Heart rate is controlled on metoprolol twice daily.  Neurology was consulted for input and felt that Plavix not needed from neurological aspect however due to cardiac stent understood need for DAPT.   Dr. Leonie Man recommended Eliquis for secondary stroke prevention after patient completed his cancer treatment.  Patient has made steady progress during his rehab stay and is currently at supervision level.  He will continue to receive further follow-up outpatient PT and OT at [facility] outpatient rehab after discharge     Rehab course: During patient's stay in rehab team conference was held to monitor patient's progress, set goals and discuss barriers to discharge. At admission, patient required min assist with mobility and supervision with ADL tasks.  He  has had improvement in activity tolerance, balance, postural control as well as ability to compensate for deficits. He has had improvement in functional use LUE  and LLE as well as improvement in awareness of left inattention.  He is able to complete most ADL tasks at modified independent level and requires supervision with LB dressing. He requires supervision with transfers due to left inattention. He requires supervision for transfers and to ambulate 150-300' with distance supervision. He occasionally requires min cues for use of AD safety. Family education was completed regarding all aspects of safety and care.   Discharge disposition: 01-Home or Self Care  Diet: Heart healthy/diabetic  Special Instructions: 1. Monitor BS ac/hs. Keep record and follow up with PCP for adjustment.  2. No driving or strenuous activity.   Discharge Instructions    AMB referral to rehabilitation   Complete by: As directed    Speech therapy --eval  and treat   Ambulatory referral to Occupational Therapy   Complete by: As directed    Eval and treat   Ambulatory referral to Physical Medicine Rehab   Complete by: As directed    1-2 weeks TC appointment   Ambulatory referral to Physical Therapy   Complete by: As directed    Eval and treat     Allergies as of 10/23/2019      Reactions   Ibuprofen Other (See Comments)   Was told to not take this    Metformin And Related    Bloody stools    Naproxen Other (See Comments)   Was told to not take this    Penicillins Hives   Did it involve swelling of the face/tongue/throat, SOB, or low BP? Unk Did it involve sudden or severe rash/hives, skin peeling, or any reaction on the inside of your mouth or nose? Yes Did you need to seek medical attention at a hospital or doctor's office? Unk When did it last happen?"Childhood-  55 years ago" If all above answers are "NO", may proceed with cephalosporin use.   Yellow Jacket Venom [bee Venom] Swelling, Other (See Comments)   Severe swelling where stung      Medication List    STOP taking these medications   insulin aspart 100 UNIT/ML injection Commonly known as: novoLOG     TAKE these medications   acetaminophen 325 MG tablet Commonly known as: TYLENOL Take 1-2 tablets (325-650 mg total) by mouth every 4 (four) hours as needed for mild pain.   aspirin 81 MG chewable tablet Chew 1 tablet (81 mg total) by mouth daily.   Baclofen 5 MG Tabs Take 5 mg by mouth 3 (three) times daily as needed.   citalopram 10 MG tablet Commonly known as: CELEXA Take 0.5 tablets (5 mg total) by mouth daily.   clonazePAM 1 MG disintegrating tablet Commonly known as: KLONOPIN Take 1 tablet (1 mg total) by mouth 2 (two) times daily as needed. What changed: reasons to take this   clopidogrel 75 MG tablet Commonly known as: PLAVIX Take 1 tablet (75 mg total) by mouth daily.   ezetimibe 10 MG tablet Commonly known as: ZETIA Take 10 mg by mouth at  bedtime.   fluticasone 50 MCG/ACT nasal spray Commonly known as: FLONASE Place 1-2 sprays into both nostrils daily as needed for allergies or rhinitis (or seasonal allergies).   gabapentin 100 MG capsule Commonly known as: NEURONTIN Take 1 capsule (100 mg total) by mouth at bedtime.   glimepiride 2 MG tablet Commonly known as: AMARYL Take 1 tablet (2 mg total) by mouth daily with breakfast.   glucose blood test strip Monitor BS before meals and at bedtime for a couple of weeks. Then may decrease to bid before meals   lisinopril 2.5 MG tablet Commonly known as: ZESTRIL Take 1 tablet (2.5 mg total) by mouth daily.   metoprolol tartrate 50 MG tablet Commonly known as: LOPRESSOR Take 1.5 tablets (75 mg total) by mouth 2 (two) times daily.   nitroGLYCERIN 0.4 MG SL tablet Commonly known as: NITROSTAT Place 0.4 mg under the tongue every 5 (five) minutes as needed for chest pain.   onetouch ultrasoft lancets Use as instructed   rosuvastatin 20 MG tablet Commonly known as: CRESTOR Take 1 tablet (20 mg total) by mouth daily.   traZODone 50 MG tablet Commonly known as: DESYREL Take 1 tablet (50 mg total) by mouth at bedtime as needed for sleep.       Follow-up Information    Lovorn, Jinny Blossom, MD Follow up.   Specialty: Physical Medicine and Rehabilitation Why: Office will call you with follow up appointment Contact information: [address] [place] [address] [phone]        [facility]. Call.   Specialty: Neurology Why: for post stroke follow up Contact information: [address] [place] [phone]       Hamrick, Lorin Mercy, MD. Call.   Specialty: Family Medicine Why: for post hospital follow up and CBC next week Contact information: [place] [address] [phone]        Volanda Napoleon, MD Follow up.   Specialty: Oncology Contact information: [address] [address] [phone]               Signed: Bary Leriche 10/26/2019, 12:10 AM

## 2019-10-23 NOTE — Progress Notes (Signed)
Patient discharged home with all personal belongings. Patient received discharge education per Reesa Chew, PA upon discharge. Patient was taken down to family personal vehicle by staff. Patient was happy to be going home.

## 2019-10-23 NOTE — Discharge Instructions (Signed)
Inpatient Rehab Discharge Instructions  Luman Holway Discharge date and time:  10/23/19  Activities/Precautions/ Functional Status: Activity: no lifting, driving, or strenuous exercise till cleared by MD Diet: cardiac diet and diabetic diet Wound Care: none needed   Functional status:  ___ No restrictions     ___ Walk up steps independently ___ 24/7 supervision/assistance   ___ Walk up steps with assistance _X__ Intermittent supervision/assistance  ___ Bathe/dress independently ___ Walk with walker     ___ Bathe/dress with assistance ___ Walk Independently    ___ Shower independently ___ Walk with assistance    _X__ Shower with assistance _X__ No alcohol     ___ Return to work/school ________   COMMUNITY REFERRALS UPON DISCHARGE:    Outpatient: PT     OT    ST             Agency: [facility] Phone: [phone]             Appointment Date/Time:*Please expect follow-up within 7-10 business days to schedule your outpatient appointment. If you have not received follow-up, be sure to contact the site directly.*  Medical Equipment/Items Ordered: rolling walker and 3-in-1 BSC                                                 Agency/Supplier: [employer] [phone]   Special Instructions:  STROKE/TIA DISCHARGE INSTRUCTIONS SMOKING Cigarette smoking nearly doubles your risk of having a stroke & is the single most alterable risk factor  If you smoke or have smoked in the last 12 months, you are advised to quit smoking for your health.  Most of the excess cardiovascular risk related to smoking disappears within a year of stopping.  Ask you doctor about anti-smoking medications  [place] Quit Line: 1-800-QUIT NOW  Free Smoking Cessation Classes [phone]  CHOLESTEROL Know your levels; limit fat & cholesterol in your diet  Lipid Panel     Component Value Date/Time   CHOL 148 10/09/2019 0500   CHOL 78 (L) 08/28/2019 0940   TRIG 119 10/09/2019 0500   HDL 26 (L)  10/09/2019 0500   HDL 28 (L) 08/28/2019 0940   CHOLHDL 5.7 10/09/2019 0500   VLDL 24 10/09/2019 0500   LDLCALC 98 10/09/2019 0500   LDLCALC 32 08/28/2019 0940      Many patients benefit from treatment even if their cholesterol is at goal.  Goal: Total Cholesterol (CHOL) less than 160  Goal:  Triglycerides (TRIG) less than 150  Goal:  HDL greater than 40  Goal:  LDL (LDLCALC) less than 100   BLOOD PRESSURE American Stroke Association blood pressure target is less that 120/80 mm/Hg  Your discharge blood pressure is:  BP: 117/69  Monitor your blood pressure  Limit your salt and alcohol intake  Many individuals will require more than one medication for high blood pressure  DIABETES (A1c is a blood sugar average for last 3 months) Goal HGBA1c is under 7% (HBGA1c is blood sugar average for last 3 months)  Diabetes:     Lab Results  Component Value Date   HGBA1C 8.0 (H) 10/09/2019     Your HGBA1c can be lowered with medications, healthy diet, and exercise.  Check your blood sugar as directed by your physician  Call your physician if you experience unexplained or low blood sugars.  PHYSICAL ACTIVITY/REHABILITATION Goal  is 30 minutes at least 4 days per week  Activity: No driving, Therapies: see above Return to work: to be decided  Activity decreases your risk of heart attack and stroke and makes your heart stronger.  It helps control your weight and blood pressure; helps you relax and can improve your mood.  Participate in a regular exercise program.  Talk with your doctor about the best form of exercise for you (dancing, walking, swimming, cycling).  DIET/WEIGHT Goal is to maintain a healthy weight  Your discharge diet is:  Diet Order            Diet Carb Modified Fluid consistency: Thin; Room service appropriate? Yes  Diet effective now               liquids Your height is:  Height: 5\' 10"  (177.8 cm) Your current weight is: Weight: 208.5 lbs Your Body Mass Index  (BMI) is:  BMI (Calculated): 29.99  Following the type of diet specifically designed for you will help prevent another stroke.  Your goal weight is:  174 lbs  Your goal Body Mass Index (BMI) is 19-24.  Healthy food habits can help reduce 3 risk factors for stroke:  High cholesterol, hypertension, and excess weight.  RESOURCES Stroke/Support Group:  Call [phone]   STROKE EDUCATION PROVIDED/REVIEWED AND GIVEN TO PATIENT Stroke warning signs and symptoms How to activate emergency medical system (call 911). Medications prescribed at discharge. Need for follow-up after discharge. Personal risk factors for stroke. Pneumonia vaccine given:  Flu vaccine given:  My questions have been answered, the writing is legible, and I understand these instructions.  I will adhere to these goals & educational materials that have been provided to me after my discharge from the hospital.     My questions have been answered and I understand these instructions. I will adhere to these goals and the provided educational materials after my discharge from the hospital.  Patient/Caregiver Signature _______________________________ Date __________  Clinician Signature _______________________________________ Date __________  Please bring this form and your medication list with you to all your follow-up doctor's appointments.

## 2019-10-23 NOTE — Progress Notes (Signed)
[place] PHYSICAL MEDICINE & REHABILITATION PROGRESS NOTE   Subjective/Complaints:  Pt ready for d/c today- asking about all the meds he's going home with.    ROS:  Pt denies SOB, abd pain, CP, N/V/C/D, and vision changes  Objective:   No results found. Recent Labs    10/22/19 0450 10/23/19 0520  WBC 9.8 8.6  HGB 12.7* 13.1  HCT 38.2* 39.8  PLT 263 298   Recent Labs    10/21/19 0936 10/23/19 0520  NA 137 141  K 3.4* 3.7  CL 103 106  CO2 24 25  GLUCOSE 189* 138*  BUN 14 17  CREATININE 0.86 0.96  CALCIUM 8.8* 9.1    Intake/Output Summary (Last 24 hours) at 10/23/2019 0820 Last data filed at 10/22/2019 1857 Gross per 24 hour  Intake 510 ml  Output --  Net 510 ml     Physical Exam: Vital Signs Blood pressure 133/82, pulse 61, temperature 97.9 F (36.6 C), resp. rate 18, height 5\' 10"  (1.778 m), weight 94.8 kg, SpO2 95 %. General: awake, alert, appropriate, laying in bed; NAD HEENT: conjugate gaze Heart: RRR Chest: CTA B/L- no W/R/R- good air movement Abdomen: Soft, NT, ND, (+)BS   Extremities: No clubbing, cyanosis, or edema. Pulses are 2+ Skin: Clean and intact without signs of breakdown Neuro: Ox3- delayed recall still . Cranial nerves 2-12 are intact. Motor function is grossly 5/5 on right side, 4+/5 left upper extremity and 4-/5 in left lower extremity with ataxia. Performed sit to stand with MinA  Psych: appropriate- asking lots of questions about meds  Assessment/Plan: 1. Functional deficits secondary to acute ischemic right PCA stroke which require 3+ hours per day of interdisciplinary therapy in a comprehensive inpatient rehab setting.  Physiatrist is providing close team supervision and 24 hour management of active medical problems listed below.  Physiatrist and rehab team continue to assess barriers to discharge/monitor patient progress toward functional and medical goals  Care Tool:  Bathing    Body parts bathed by patient: Right arm,  Left arm, Chest, Abdomen, Front perineal area, Buttocks, Right upper leg, Left upper leg, Right lower leg, Left lower leg, Face         Bathing assist Assist Level: Independent with assistive device     Upper Body Dressing/Undressing Upper body dressing   What is the patient wearing?: Pull over shirt    Upper body assist Assist Level: Independent with assistive device    Lower Body Dressing/Undressing Lower body dressing      What is the patient wearing?: Underwear/pull up, Pants     Lower body assist Assist for lower body dressing: Supervision/Verbal cueing     Toileting Toileting    Toileting assist Assist for toileting: Independent with assistive device     Transfers Chair/bed transfer  Transfers assist     Chair/bed transfer assist level: Independent with assistive device Chair/bed transfer assistive device: Programmer, multimedia   Ambulation assist      Assist level: Independent with assistive device Assistive device: Walker-rolling Max distance: 148ft   Walk 10 feet activity   Assist     Assist level: Independent with assistive device Assistive device: Walker-rolling   Walk 50 feet activity   Assist    Assist level: Independent with assistive device Assistive device: Walker-rolling    Walk 150 feet activity   Assist    Assist level: Independent with assistive device Assistive device: Walker-rolling    Walk 10 feet on uneven surface  activity  Assist     Assist level: Supervision/Verbal cueing Assistive device: Aeronautical engineer Will patient use wheelchair at discharge?: No             Wheelchair 50 feet with 2 turns activity    Assist            Wheelchair 150 feet activity     Assist          Blood pressure 133/82, pulse 61, temperature 97.9 F (36.6 C), resp. rate 18, height 5\' 10"  (1.778 m), weight 94.8 kg, SpO2 95 %.  Medical Problem List and Plan: 1.   Impaired mobility and ADLs secondary to acute ischemic right PCA stroke.             -patient may shower             -ELOS/Goals: 7 days modI  -Continue CIR 2.  Antithrombotics: -DVT/anticoagulation:  Mechanical: Sequential compression devices, below knee Bilateral lower extremities and TEDs             -antiplatelet therapy: Have restarted Plavix- no more hematochezia.  6/21- no bleeding- con't Plavix   6/24- con't Plavix- Hb hasn't dropped with rectal bleeding 3. Pain Management: Will add low dose gabapentin and change robaxin to baclofen prn  muscle spasms. Currently well controlled.  4. Mood: LCSW to follow for evaluation and support.              -antipsychotic agents: N/A 5. Neuropsych: This patient is capable of making decisions on his own behalf. 6. Skin/Wound Care: Routine pressure relief measures. 7. Fluids/Electrolytes/Nutrition: Monitor intake/output.  Offer supplements as needed Po intake 8.  CAD s/p PCI to RCA 12/20: On DAPT and Crestor.  9.  T2DM: Hemoglobin A1c 8.0.  Blood sugars ac/hs--has been off meds X 1 year due to improvement in A1c-5.4 (loss weight and dieting).   CBGs have been well controlled. CBG (last 3)  Recent Labs    10/22/19 1551 10/22/19 2102 10/23/19 0608  GLUCAP 110* 145* 125*    6/21- BGs 160s to 220s-  Continues ot be elevated- will add Amaryl 2 mg daily.   6/22- slightly improved once got Amaryl this AM  6/23- BGs much better 136-175- down from mid 200s- cont Amaryl  6/24- BGs 93-144- much improved- con't Amaryl   6/25- con't Amaryl 10.  Hematochezia,/iron deficiency anemia: Stable 11.  Hypokalemia: Stable 12.  Stage IV colon cancer: Monitor for signs of bleeding is now back on DAPT.  6/19: had large amount of bloody stools. Held plavix. CBC repeated today and Hgb stable. Had small bloody stools today. Continue to monitor.   6/20: Plavix restarted. See #2  6/22- no rectal bleeding documented.   6/23- was documented last night had multiple  bloody BMs last night- no other documentation about clots, color, etc were documented. Checking CBC to see if drop in Hb.   6/24- No drop in Hb- 12.7- so con't Plavix- Neuro wants pt on Eliquis, but cannot do until tumor treated since had problems in past with Eliquis.   13.  PAF: Monitor heart rate 3 times daily.  Continue metoprolol twice daily. Currently well controlled.   6/21- in RRR currently- con't meds 14.  HTN: Monitor blood pressures 3 times daily.  Continue low-dose lisinopril as well as metoprolol. Labile: continue to monitor.  15. Situational anxiety: Would like to be started on something. Will start Celexa. Continue Klonopin prn has being effective  6/23- pt  reports working better 16. Insomnia: slept better last night.  17. Leukocytosis: normalized.  18. Dispo  6/22- team conference today- d/c planned for Friday  6/24- d/c today  LOS: 8 days A FACE TO FACE EVALUATION WAS PERFORMED  Kathleen Likins 10/23/2019, 8:20 AM

## 2019-10-26 DIAGNOSIS — F411 Generalized anxiety disorder: Secondary | ICD-10-CM

## 2019-10-26 DIAGNOSIS — E119 Type 2 diabetes mellitus without complications: Secondary | ICD-10-CM

## 2019-10-28 ENCOUNTER — Telehealth: Payer: Self-pay | Admitting: Registered Nurse

## 2019-10-28 NOTE — Telephone Encounter (Signed)
Transitional Care call  Patient name: John Douglas  DOB: 1956-01-09 1. Are you/is patient experiencing any problems since coming home? No a. Are there any questions regarding any aspect of care? No 2. Are there any questions regarding medications administration/dosing? No a. Are meds being taken as prescribed? Yes b. "Patient should review meds with caller to confirm" Medication List Reviewed 3. Have there been any falls? No 4. Has Home Health been to the house and/or have they contacted you? No, This provider placed a call to [facility], they will be calling [name] to schedule appointments.  a. If not, have you tried to contact them? No b. Can we help you contact them? Yes, see above. 5. Are bowels and bladder emptying properly? Yes a. Are there any unexpected incontinence issues? No b. If applicable, is patient following bowel/bladder programs? NA 6. Any fevers, problems with breathing, unexpected pain? No 7. Are there any skin problems or new areas of breakdown? No 8. Has the patient/family member arranged specialty MD follow up (ie cardiology/neurology/renal/surgical/etc.)?  [name] was instructed to call [facility] Neurology, Dr. Lisbeth Ply and Dr. Marin Olp to schedule HFU appointments, he verbalizes understanding.  a. Can we help arrange? No 9. Does the patient need any other services or support that we can help arrange? No 10. Are caregivers following through as expected in assisting the patient? Yes 11. Has the patient quit smoking, drinking alcohol, or using drugs as recommended? (                        ) Appointment changed per patient request:   Appointment date/time 11/10/2019  arrival time 11:00 for 11:20 appointment with Bayard Hugger ANP-C. At  [place]

## 2019-11-03 ENCOUNTER — Telehealth: Payer: Self-pay

## 2019-11-03 ENCOUNTER — Encounter: Payer: Medicare HMO | Admitting: Registered Nurse

## 2019-11-03 NOTE — Telephone Encounter (Signed)
Patients daughter called and left message stating she had some questions.  Spoke with patients daughter and patient. They stated they weren't sure what the next steps were: when he was supposed to get his port placed, his treatment plan, pathology results etc. He gets back from the beach next week and his wanting to get scheduled for his appt with Dr.Ennever and port if possible. Informed him this nurse would call him back tomorrow with more information as the scheduler is out of the office today.

## 2019-11-06 ENCOUNTER — Emergency Department (HOSPITAL_COMMUNITY): Payer: Medicare HMO

## 2019-11-06 ENCOUNTER — Telehealth: Payer: Self-pay | Admitting: Internal Medicine

## 2019-11-06 ENCOUNTER — Emergency Department (HOSPITAL_COMMUNITY)
Admission: EM | Admit: 2019-11-06 | Discharge: 2019-11-06 | Disposition: A | Discharge status: Home / Self Care | Payer: Medicare HMO | Attending: Emergency Medicine | Admitting: Emergency Medicine

## 2019-11-06 ENCOUNTER — Other Ambulatory Visit: Payer: Self-pay

## 2019-11-06 ENCOUNTER — Encounter (HOSPITAL_COMMUNITY): Payer: Self-pay | Admitting: Emergency Medicine

## 2019-11-06 DIAGNOSIS — C189 Malignant neoplasm of colon, unspecified: Secondary | ICD-10-CM | POA: Diagnosis not present

## 2019-11-06 DIAGNOSIS — E119 Type 2 diabetes mellitus without complications: Secondary | ICD-10-CM | POA: Diagnosis not present

## 2019-11-06 DIAGNOSIS — I4891 Unspecified atrial fibrillation: Secondary | ICD-10-CM | POA: Diagnosis not present

## 2019-11-06 DIAGNOSIS — I480 Paroxysmal atrial fibrillation: Secondary | ICD-10-CM

## 2019-11-06 DIAGNOSIS — R000 Tachycardia, unspecified: Secondary | ICD-10-CM | POA: Diagnosis present

## 2019-11-06 DIAGNOSIS — I2510 Atherosclerotic heart disease of native coronary artery without angina pectoris: Secondary | ICD-10-CM | POA: Insufficient documentation

## 2019-11-06 DIAGNOSIS — Z7982 Long term (current) use of aspirin: Secondary | ICD-10-CM | POA: Diagnosis not present

## 2019-11-06 DIAGNOSIS — Z7984 Long term (current) use of oral hypoglycemic drugs: Secondary | ICD-10-CM | POA: Diagnosis not present

## 2019-11-06 DIAGNOSIS — Z79899 Other long term (current) drug therapy: Secondary | ICD-10-CM | POA: Diagnosis not present

## 2019-11-06 DIAGNOSIS — I10 Essential (primary) hypertension: Secondary | ICD-10-CM | POA: Diagnosis not present

## 2019-11-06 DIAGNOSIS — Z7901 Long term (current) use of anticoagulants: Secondary | ICD-10-CM | POA: Insufficient documentation

## 2019-11-06 DIAGNOSIS — E785 Hyperlipidemia, unspecified: Secondary | ICD-10-CM | POA: Diagnosis not present

## 2019-11-06 LAB — CBC
HCT: 40.3 % (ref 39.0–52.0)
Hemoglobin: 13.4 g/dL (ref 13.0–17.0)
MCH: 28.4 pg (ref 26.0–34.0)
MCHC: 33.3 g/dL (ref 30.0–36.0)
MCV: 85.4 fL (ref 80.0–100.0)
Platelets: 306 10*3/uL (ref 150–400)
RBC: 4.72 MIL/uL (ref 4.22–5.81)
RDW: 17.6 % — ABNORMAL HIGH (ref 11.5–15.5)
WBC: 10.8 10*3/uL — ABNORMAL HIGH (ref 4.0–10.5)
nRBC: 0 % (ref 0.0–0.2)

## 2019-11-06 LAB — BASIC METABOLIC PANEL
Anion gap: 12 (ref 5–15)
BUN: 24 mg/dL — ABNORMAL HIGH (ref 8–23)
CO2: 19 mmol/L — ABNORMAL LOW (ref 22–32)
Calcium: 9.6 mg/dL (ref 8.9–10.3)
Chloride: 108 mmol/L (ref 98–111)
Creatinine, Ser: 1.07 mg/dL (ref 0.61–1.24)
GFR calc Af Amer: >60 mL/min (ref >60)
GFR calc non Af Amer: >60 mL/min (ref >60)
Glucose, Bld: 117 mg/dL — ABNORMAL HIGH (ref 70–99)
Potassium: 3.1 mmol/L — ABNORMAL LOW (ref 3.5–5.1)
Sodium: 139 mmol/L (ref 135–145)

## 2019-11-06 MED ORDER — METOPROLOL TARTRATE 5 MG/5ML IV SOLN
5.0000 mg | Freq: Once | INTRAVENOUS | Status: AC
  Administered 2019-11-06: 5 mg via INTRAVENOUS
  Filled 2019-11-06: qty 5

## 2019-11-06 MED ORDER — METOPROLOL TARTRATE 50 MG PO TABS: 100.0000 mg | ORAL_TABLET | Freq: Two times a day (BID) | ORAL | 0 refills | Status: DC

## 2019-11-06 NOTE — Discharge Instructions (Signed)
Increase your metoprolol dose to 100 mg twice a day.  You have been referred to follow-up in the atrial fibrillation clinic. Please return the emergency department you have any further problems especially, lightheadedness, weakness, chest pain, or bleeding.

## 2019-11-06 NOTE — Telephone Encounter (Signed)
Patients daughter calling in after she was told patients girlfriend was not able to be spoken to. Daughter is concerned and wanting to know if patient should go to ED  Please advise

## 2019-11-06 NOTE — ED Triage Notes (Addendum)
Pt presents from home for afib with rvr. Apple watched noted 200+ as rate, rate 100-130 with EMS, now ~130 sustained during triage exam. H/o afib, MI with stents, DM, colon CA, recent CVA in June.  This event started at midnight, pt "felt like a panic attack", denies having CP, SOB, weakness at this time.  Denies fever, N/V/D, HA, cough.  EMS vitals: 98%RA, 129/84, CBG97

## 2019-11-06 NOTE — Consult Note (Addendum)
Cardiology Consultation:   Patient ID: John Douglas MRN: [id number]; DOB: 05-20-1955  Admit date: 11/06/2019 Date of Consult: 11/06/2019  Primary Care Provider: Leonides Sake, MD [facility] Cardiologist: Nelva Bush, MD  [facility] Electrophysiologist:  None    Patient Profile:   John Douglas is a 64 y.o. male with a hx of HTN, HLD, a fib, pre, diabetes, obesity cardiomyopathy with EF 45% and in 03/2019 STEMI with PCI to RCA X 3 stents at [facility] . In Feb had V who is being seen today for the evaluation of atrial fib at the request of Dr. Jeanell Sparrow.  History of Present Illness:   Mr. Begue with above hx and repeat cath in Feb with PCI to LAD 06/05/19 and VT sustained on arrival.  He had dyspnea with brilinta so changed to plavix 09/30/19.   He was not on anticoagulation with GI bleeding and need for DAPT.  Colonoscopy 10/07/19 2021positive for rectal adenocarcinoma.  10/08/19 had CVA right PCA infarct with petechial hemorrhage.  Patient had been off Eliquis for a month and on DAPT due to bloody stools.  Dr. Leonie Man felt that stroke was embolic due to A. fib with question hypercoagulopathy with new diagnosis of cancer.  DAPT was held due to petechial hemorrhage and low-dose ASA resumed on 06/11.  Plavix was resumed when transferred to rehab.  Discharged 10/23/19 on asa and plavix. He was on metoprolol 75 mg BID, lisinopril 2.5 mg dialy    Plan for eliquis for secondary stroke prevention  After pt completed his cancer treatment.  Has not yet started chemo.  Dr Jorje Guild to arrange  Today he presents with rapid HR, his Apple watch noted HR 200 and her felt like a panic attack.  No chest pain or SOB.   EKG:  The EKG was personally reviewed and demonstrates:  A fib with inferoposterior infarct recent and lateral ischemia Telemetry:  Telemetry was personally reviewed and demonstrates:  Atrial fib. Na 139, K+ 3.1 BUN 24, Cr 1.07  WBC 10.8, Hgb 13.4 plts 306  PCXR NAD  BP 106/87 P 77 T 99.2  HR  from 100 to 120 now.   Past Medical History:  Diagnosis Date  . Allergic rhinitis    [facility]  . Anxiety   . Atrial fibrillation ([place])    [facility]  . Benign essential hypertension    [facility]  . Coronary artery disease    [facility]  . Diabetic neuropathy, type II diabetes mellitus ([place])    [facility]  . Mixed hyperlipidemia    [facility]  . Myocardial infarct [facility])    [facility]  . Obesity, unspecified    [facility]  . Rectal bleeding     [facility]  . Wide-complex tachycardia ([place]) 06/04/2019    Past Surgical History:  Procedure Laterality Date  . BIOPSY  10/07/2019   Procedure: BIOPSY;  Surgeon: Lavena Bullion, DO;  Location: [facility]OSCOPY;  Service: Gastroenterology;;  . COLONOSCOPY WITH PROPOFOL N/A 10/07/2019   Procedure: COLONOSCOPY WITH PROPOFOL;  Surgeon: Lavena Bullion, DO;  Location: [facility];  Service: Gastroenterology;  Laterality: N/A;  . CORONARY ANGIOPLASTY     3 stents  . LEFT HEART CATH AND CORONARY ANGIOGRAPHY N/A 06/05/2019   Procedure: LEFT HEART CATH AND CORONARY ANGIOGRAPHY;  Surgeon: Nelva Bush, MD;  Location: [place] CV LAB;  Service: Cardiovascular;  Laterality:  N/A;  . POLYPECTOMY  10/07/2019   Procedure: POLYPECTOMY;  Surgeon: Lavena Bullion, DO;  Location: [facility];  Service: Gastroenterology;;  . Lia Foyer TATTOO INJECTION  10/07/2019   Procedure: SUBMUCOSAL TATTOO INJECTION;  Surgeon: Lavena Bullion, DO;  Location: [facility];  Service: Gastroenterology;;     Home Medications:  Prior to Admission medications   Medication Sig Start Date End Date Taking? Authorizing Provider  acetaminophen (TYLENOL) 325 MG tablet Take 1-2 tablets (325-650 mg total) by mouth every 4 (four) hours as needed for mild pain. 10/21/19  Yes Love, Ivan Anchors, PA-C  aspirin 81 MG chewable tablet Chew 1 tablet (81 mg total) by mouth daily. 06/07/19  Yes Kathyrn Drown D, NP  citalopram (CELEXA) 10 MG tablet Take 0.5 tablets (5 mg total) by mouth daily. 10/24/19  Yes Love, Ivan Anchors, PA-C  clonazePAM (KLONOPIN) 1 MG disintegrating tablet Take 1 tablet (1 mg total) by mouth 2 (two) times daily as needed. Patient taking differently: Take 1 mg by mouth 2 (two) times daily as needed (Anxiety).  10/23/19  Yes Love, Ivan Anchors, PA-C  clopidogrel (PLAVIX) 75 MG tablet Take 1 tablet (75 mg total) by mouth daily. 10/23/19  Yes Love, Ivan Anchors, PA-C  ezetimibe (ZETIA) 10 MG tablet Take 10 mg by mouth at bedtime. 05/25/19  Yes [provider]  fluticasone (FLONASE) 50 MCG/ACT nasal spray Place 1-2 sprays into both nostrils daily as needed for allergies or rhinitis (or seasonal allergies).   Yes [provider]  gabapentin (NEURONTIN) 100 MG capsule Take 1 capsule (100 mg total) by mouth at bedtime. 10/23/19  Yes Love, Ivan Anchors, PA-C  glimepiride (AMARYL) 2 MG tablet Take 1 tablet (2 mg total) by mouth daily with breakfast. 10/24/19  Yes Love, Ivan Anchors, PA-C  lisinopril (ZESTRIL) 2.5 MG tablet Take 1 tablet (2.5 mg total) by mouth daily. 10/23/19  Yes Love, Ivan Anchors, PA-C  metoprolol tartrate (LOPRESSOR) 50 MG tablet Take 1.5 tablets (75 mg total) by mouth 2 (two) times daily. 10/23/19  Yes Love, Ivan Anchors, PA-C  rosuvastatin (CRESTOR) 20 MG tablet Take 1 tablet (20 mg total) by mouth daily. 10/23/19 01/21/20 Yes Love, Ivan Anchors, PA-C  traZODone (DESYREL) 50 MG tablet Take 1 tablet (50 mg total) by mouth at bedtime as needed for sleep. 10/23/19  Yes Love, Ivan Anchors, PA-C  Baclofen 5 MG TABS Take 5 mg by mouth 3 (three) times daily as needed. Patient not taking: Reported on 11/06/2019 10/23/19   Bary Leriche, PA-C  glucose blood test strip Monitor BS before meals and at bedtime for a couple of weeks. Then may decrease to bid before meals 10/23/19   Love, Ivan Anchors, PA-C    Lancets [facility] ULTRASOFT) lancets Use as instructed 10/23/19   Love, Ivan Anchors, PA-C  nitroGLYCERIN (NITROSTAT) 0.4 MG SL tablet Place 0.4 mg under the tongue every 5 (five) minutes as needed for chest pain.  05/25/19 05/24/20  [provider]    Inpatient Medications: Scheduled Meds:  Continuous Infusions:  PRN Meds:   Allergies:    Allergies  Allergen Reactions  . Ibuprofen Other (See Comments)    Was told to not take this   . Metformin And Related     Bloody stools   . Naproxen Other (See Comments)    Was told to not take this   . Penicillins Hives    Did it involve swelling of the face/tongue/throat, SOB, or low BP? Unk Did it involve sudden  or severe rash/hives, skin peeling, or any reaction on the inside of your mouth or nose? Yes Did you need to seek medical attention at a hospital or doctor's office? Unk When did it last happen?"Childhood- 55 years ago" If all above answers are "NO", may proceed with cephalosporin use.   . [place]et Venom [Bee Venom] Swelling and Other (See Comments)    Severe swelling where stung    Social History:   Social History   Socioeconomic History  . Marital status: Divorced    Spouse name: Not on file  . Number of children: 2  . Years of education: Not on file  . Highest education level: Not on file  Occupational History  . Occupation: retired  Tobacco Use  . Smoking status: Never Smoker  . Smokeless tobacco: Former Systems developer    Types: Secondary school teacher  . Vaping Use: Never used  Substance and Sexual Activity  . Alcohol use: Yes    Comment: occasional  . Drug use: Yes    Types: Marijuana  . Sexual activity: Not on file  Other Topics Concern  . 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 Difficulty of  Paying Living Expenses:   Food Insecurity:   . Worried About Charity fundraiser in the Last Year:   . Arboriculturist in the Last Year:   Transportation Needs:   . Film/video editor (Medical):   Marland Kitchen Lack of Transportation (Non-Medical):   Physical Activity:   . Days of Exercise per Week:   . Minutes of Exercise per Session:   Stress:   . Feeling of Stress :   Social Connections:   . Frequency of Communication with Friends and Family:   . Frequency of Social Gatherings with Friends and Family:   . Attends Religious Services:   . Active Member of Clubs or Organizations:   . Attends Archivist Meetings:   Marland Kitchen Marital Status:   Intimate Partner Violence:   . Fear of Current or Ex-Partner:   . Emotionally Abused:   Marland Kitchen Physically Abused:   . Sexually Abused:     Family History:    Family History  Problem Relation Age of Onset  . Diverticulitis Mother   . Hypertension Father   . Heart disease Father        MI  . Prostate cancer Father   . Colon cancer Neg Hx   . Stomach cancer Neg Hx   . Pancreatic cancer Neg Hx   . Rectal cancer Neg Hx      ROS:  Please see the history of present illness.  General:no colds or fevers, no weight changes Skin:no rashes or ulcers HEENT:no blurred vision, no congestion CV:see HPI PUL:see HPI GI:no diarrhea constipation or melena, no indigestion, less rectal bleeding very minimal. GU:no hematuria, no dysuria MS:no joint pain, no claudication Neuro:no syncope, no lightheadedness Endo:no diabetes, no thyroid disease  All other ROS reviewed and negative.     Physical Exam/Data:   Vitals:   11/06/19 1559 11/06/19 1607 11/06/19 1645 11/06/19 1800  BP: 108/71  106/87 (!) 125/93  Pulse: (!) 144  82 99  Resp: 20  16 (!) 22  Temp: 99.2 F (37.3 C)  TempSrc: Oral     SpO2: 99% 98% 99% 98%  Weight:  94.8 kg    Height:  5\' 10"  (1.778 m)     No intake or output data in the 24 hours ending 11/06/19 1835 Last 3 Weights  11/06/2019 10/15/2019 10/08/2019  Weight (lbs) 209 lb 209 lb 220 lb  Weight (kg) 94.802 kg 94.802 kg 99.791 kg     Body mass index is 29.99 kg/m.  General:  Well nourished, well developed, in no acute distress HEENT: normal Lymph: no adenopathy Neck: no JVD Endocrine:  No thryomegaly Vascular: No carotid bruits; FA pulses 2+ bilaterally without bruits  Cardiac:  irreg irreg; no murmur  Lungs:  clear to auscultation bilaterally, no wheezing, rhonchi or rales  Abd: soft, nontender, no hepatomegaly  Ext: no edema Musculoskeletal:  No deformities, BUE and BLE strength normal and equal Skin: warm and dry  Neuro:  Alert and oriented X 3 MAE follows commands, no focal abnormalities noted Psych:  Normal affect   Relevant CV Studies: Cath 06/05/19 Conclusions: 1. Significant single-vessel coronary artery disease with diffuse mid LAD disease of up to 80-90%, extending into the ostium of D1. 2. Mild to moderate, non-obstructive coronary artery disease involving the proximal LAD, LCx and RCA. 3. Widely patent overlapping stents in the RCA extending from the ostium through the distal vessel. 4. Successful PCI to the mid LAD using Synergy 2.5 x 38 mm drug-eluting stent (post-dilated to 2.9 mm) with 0% residual stenosis and TIMI-3 flow.  Jailed D2 branch exhibits 90% ostial stenosis but TIMI-3 flow.  This branch was not intervened upon.  Recommendations: 1. Dual antiplatelet therapy with aspirin and ticagrelor for at least 3 months, at which point we could consider stopping aspirin given report of hematochezia. 2. I favor holding apixaban going further, as I worry that the risk of GI bleeding outweight the benefit at this time. 3. Aggressive secondary prevention.  Nelva Bush, MD Diagnostic Dominance: Right  Intervention     Laboratory Data:  High Sensitivity Troponin:  No results for input(s): TROPONINIHS in the last 720 hours.   Chemistry Recent Labs  Lab 11/06/19 1649  NA 139    K 3.1*  CL 108  CO2 19*  GLUCOSE 117*  BUN 24*  CREATININE 1.07  CALCIUM 9.6  GFRNONAA >60  GFRAA >60  ANIONGAP 12    No results for input(s): PROT, ALBUMIN, AST, ALT, ALKPHOS, BILITOT in the last 168 hours. Hematology Recent Labs  Lab 11/06/19 1649  WBC 10.8*  RBC 4.72  HGB 13.4  HCT 40.3  MCV 85.4  MCH 28.4  MCHC 33.3  RDW 17.6*  PLT 306   BNPNo results for input(s): BNP, PROBNP in the last 168 hours.  DDimer No results for input(s): DDIMER in the last 168 hours.   Radiology/Studies:  DG Chest Port 1 View  Result Date: 11/06/2019 CLINICAL DATA:  AFib EXAM: PORTABLE CHEST 1 VIEW COMPARISON:  10/08/2019 FINDINGS: The heart size and mediastinal contours are within normal limits. Both lungs are clear. The visualized skeletal structures are unremarkable. IMPRESSION: No active disease. Electronically Signed   By: Donavan Foil M.D.   On: 11/06/2019 17:15        NO CHEST PAIN     Assessment and Plan:   1. Atrial fib RVR on metoprolol 75 BID - no anticoagulation until post cancer treatment per Dr. Leonie Man with recent CVA. And episodic rectal bleeding.  Give IV lopressor to slow. Will increase lopressor to 100 mg  BID  And follow up in a fib clinic. 2. CAD with recent stents to LAD and RCA 12/20 and 2/21 on DAPT. Hx of V Tach with recent NSTEMI and stent to LAD 3. Recent CVA discharge from rehab 10/23/19 4. HLD on crestor and zetia.    Continue.  5. Cancer - to begin chemo soon. Episodic rectal bleeding minimal now. No clots.       For questions or updates, please contact [place] Please consult [email or web address] for contact info under    Signed, Candee Furbish, MD  11/06/2019 6:35 PM  Personally seen and examined. Agree with above.   63 year old male with complicated past medical history including recent stroke, adenocarcinoma of the colon, paroxysmal atrial fibrillation, prior GI bleeding here with paroxysmal atrial fibrillation with rapid ventricular response.  He  currently feels fine, has been slightly anxious with more stress in his life, nonmedical related.  Heart rate currently on telemetry shows 80-100.  Occasionally when he moves around it will bounce up to the 120 range.  His wife is a Marine scientist.  He is wearing an apple watch which detected his atrial fibrillation new onset several hours ago.  Heart rate range was from 60-1 30.  This made him anxious.  He was worried about the possibility of having another stroke.  He had a drug-eluting stent placed to his mid LAD in February 2021, Synergy stent.  He is currently on dual antiplatelet therapy.  Neurology, Dr. Leonie Man and primary team taking care of him discussed Eliquis/dual antiplatelet therapy.  Dr. Saunders Revel is his cardiologist.  Plan was to continue with dual antiplatelet therapy until his adenocarcinoma was treated.  There was concern over return of GI bleeding if Eliquis were restarted.  Here he is currently comfortable lungs are clear, good pedal pulses, heart irregularly irregular, occasionally tachycardic.  Telemetry personally reviewed.  Prior studies reviewed as above.  I think it is reasonable for [place] to increase his metoprolol from 75 twice daily up to 100 twice daily for improved rate control.  His blood pressures are not robust however he is not feeling dizzy.  Continue with adequate hydration.  There is a good chance that he will auto convert once again back to sinus rhythm.  Unfortunately, given his risk of GI bleeding, Eliquis is not being utilized at this time.  We will go ahead and have close follow-up in the atrial fibrillation clinic.  Discussed with he and his wife.  They are amenable.  Candee Furbish, MD

## 2019-11-06 NOTE — Telephone Encounter (Signed)
The patient is currently in the ER for evaluation.  

## 2019-11-06 NOTE — Telephone Encounter (Signed)
Spoke with the patient daughter John Douglas. She sts that the patient HR has gotten up to 130 bpm and has been irregular. She purchased the patient an apple watch so that he can monitor his rhythm. He does have a hx of PAF. He is on antiplatelet therapy but he is not on a anticoagulant. Patient also had a CVA in June 2021.  Mikal Plane that I do think it would be best for the patient to be seen in the ED for evaluation. John Douglas agreed with the recommendation and will have the patients girlfriend transport him asap.

## 2019-11-06 NOTE — Telephone Encounter (Signed)
I agree that Mr. Chasen should be taken to the ED for further evaluation.  Nelva Bush, MD [facility]

## 2019-11-06 NOTE — ED Notes (Signed)
Patient verbalizes understanding of discharge instructions. Opportunity for questioning and answers were provided. Armband removed by staff, pt discharged from ED to home 

## 2019-11-06 NOTE — ED Provider Notes (Signed)
[facility] EMERGENCY DEPARTMENT Provider Note   CSN: [id number] Arrival date & time: 11/06/19  1550     History Chief Complaint  Patient presents with  . Atrial Fibrillation    John Douglas is a 64 y.o. male.  HPI 64 yo male ho paroxysmal atrial fibrillation, recent cerebellar stroke, hypertension, cad, recent diagnosis of stage 4 colorectal cancer presents today with atrial fibrillation rvr as signalled by his apple watch at midnight last night.   Patient attributes some reason for this as his anxiety and took klonopin.  However, he has noted that his heart rate has been elevated.  Patient denies chest pain, shortness of breath.  He has had recent gi bleeding from rectal cancer and has had eliquis stopped, but has been back on plavix and asa.   NO covid and first pfizer June 25 From Dr. Darnelle Bos office note 09/30/2019 HPI:  John Douglas is a 64 y.o. male with history of coronary artery disease with inferior STEMI and primary PCI to the RCA in 36/6440 complicated by sustained ventricular tachycardia in the setting of residual left coronary artery disease status post subsequent PCI to the mid LAD (06/2019), hypertension, hyperlipidemia, atrial fibrillation, ischemic cardiomyopathy, type 2 diabetes mellitus, and recurrent GI bleeding, who presents for follow-up of coronary artery disease and cardiomyopathy.  I last saw John Douglas a month ago, at which time he was bothered primarily by ongoing abdominal pain and intermittent hematochezia.  He has since been evaluated by GI and is scheduled for colonoscopy with Dr. Carlean Purl in 1 week.  John Douglas also noted slight interval increase in his dyspnea and was concerned that ticagrelor could be contributing to this.  We therefore agreed to transition to clopidogrel.  We also agreed to increase metoprolol tartrate to 75 mg daily for improved heart rate control in the setting of recurrent atrial fibrillation.   Past Medical History:  Diagnosis  Date  . Allergic rhinitis    [facility]  . Anxiety   . Atrial fibrillation ([place])    [facility]  . Benign essential hypertension    [facility]  . Coronary artery disease    [facility]  . Diabetic neuropathy, type II diabetes mellitus ([place])    [facility]  . Mixed hyperlipidemia    [facility]  . Myocardial infarct [facility])    [facility]  . Obesity, unspecified    [facility]  . Rectal bleeding    [facility]  . Wide-complex tachycardia ([place]) 06/04/2019    Patient Active Problem List   Diagnosis Date Noted  . Generalized anxiety disorder 10/26/2019  . Diabetes mellitus ([place]) 10/26/2019  . Acute ischemic right PCA stroke ([place]) 10/15/2019  . Ischemic stroke ([place]) 10/08/2019  . Leukocytosis   . Hypokalemia   . Colon cancer ([place])   . Change in bowel habits   . Rectal bleeding   . Rectal mass   . Rectal polyp   . Polyp of descending colon   . Diverticulosis of colon without hemorrhage   . Gastrointestinal hemorrhage 08/29/2019  . Coronary artery disease 08/29/2019  . Ischemic cardiomyopathy 08/29/2019  . Paroxysmal atrial fibrillation ([facility])   . Essential hypertension   . Hyperlipidemia LDL goal <70   . Non-ST elevation (NSTEMI) myocardial infarction ([place])   . Ventricular tachycardia ([place]) 06/04/2019    Past Surgical History:  Procedure Laterality Date  .  BIOPSY  10/07/2019   Procedure: BIOPSY;  Surgeon: Lavena Bullion, DO;  Location: [facility];  Service: Gastroenterology;;  . COLONOSCOPY WITH PROPOFOL N/A 10/07/2019   Procedure: COLONOSCOPY WITH PROPOFOL;  Surgeon: Lavena Bullion, DO;  Location: [facility];  Service: Gastroenterology;  Laterality: N/A;  . CORONARY ANGIOPLASTY     3 stents  . LEFT HEART CATH AND CORONARY ANGIOGRAPHY N/A 06/05/2019   Procedure: LEFT HEART CATH AND  CORONARY ANGIOGRAPHY;  Surgeon: Nelva Bush, MD;  Location: [facility] CV LAB;  Service: Cardiovascular;  Laterality: N/A;  . POLYPECTOMY  10/07/2019   Procedure: POLYPECTOMY;  Surgeon: Lavena Bullion, DO;  Location: [facility];  Service: Gastroenterology;;  . Lia Foyer TATTOO INJECTION  10/07/2019   Procedure: SUBMUCOSAL TATTOO INJECTION;  Surgeon: Lavena Bullion, DO;  Location: [facility];  Service: Gastroenterology;;       Family History  Problem Relation Age of Onset  . Diverticulitis Mother   . Hypertension Father   . Heart disease Father        MI  . Prostate cancer Father   . Colon cancer Neg Hx   . Stomach cancer Neg Hx   . Pancreatic cancer Neg Hx   . Rectal cancer Neg Hx     Social History   Tobacco Use  . Smoking status: Never Smoker  . Smokeless tobacco: Former Systems developer    Types: Secondary school teacher  . Vaping Use: Never used  Substance Use Topics  . Alcohol use: Yes    Comment: occasional  . Drug use: Yes    Types: Marijuana    Home Medications Prior to Admission medications   Medication Sig Start Date End Date Taking? Authorizing Provider  acetaminophen (TYLENOL) 325 MG tablet Take 1-2 tablets (325-650 mg total) by mouth every 4 (four) hours as needed for mild pain. 10/21/19  Yes Love, Ivan Anchors, PA-C  aspirin 81 MG chewable tablet Chew 1 tablet (81 mg total) by mouth daily. 06/07/19  Yes Kathyrn Drown D, NP  citalopram (CELEXA) 10 MG tablet Take 0.5 tablets (5 mg total) by mouth daily. 10/24/19  Yes Love, Ivan Anchors, PA-C  clonazePAM (KLONOPIN) 1 MG disintegrating tablet Take 1 tablet (1 mg total) by mouth 2 (two) times daily as needed. Patient taking differently: Take 1 mg by mouth 2 (two) times daily as needed (Anxiety).  10/23/19  Yes Love, Ivan Anchors, PA-C  clopidogrel (PLAVIX) 75 MG tablet Take 1 tablet (75 mg total) by mouth daily. 10/23/19  Yes Love, Ivan Anchors, PA-C  ezetimibe (ZETIA) 10 MG tablet Take 10 mg by mouth at bedtime. 05/25/19  Yes [provider]  fluticasone (FLONASE) 50 MCG/ACT nasal spray Place 1-2 sprays into both nostrils daily as needed for allergies or rhinitis (or seasonal allergies).   Yes [provider]  gabapentin (NEURONTIN) 100 MG capsule Take 1 capsule (100 mg total) by mouth at bedtime. 10/23/19  Yes Love, Ivan Anchors, PA-C  glimepiride (AMARYL) 2 MG tablet Take 1 tablet (2 mg total) by mouth daily with breakfast. 10/24/19  Yes Love, Ivan Anchors, PA-C  lisinopril (ZESTRIL) 2.5 MG tablet Take 1 tablet (2.5 mg total) by mouth daily. 10/23/19  Yes Love, Ivan Anchors, PA-C  rosuvastatin (CRESTOR) 20 MG tablet Take 1 tablet (20 mg total) by mouth daily. 10/23/19 01/21/20 Yes Love, Ivan Anchors, PA-C  traZODone (DESYREL) 50 MG tablet Take 1 tablet (50 mg total) by mouth at bedtime as needed for sleep. 10/23/19  Yes Love, Ivan Anchors,  PA-C  glucose blood test strip Monitor BS before meals and at bedtime for a couple of weeks. Then may decrease to bid before meals 10/23/19   Love, Ivan Anchors, PA-C  Lancets [facility] ULTRASOFT) lancets Use as instructed 10/23/19   Love, Ivan Anchors, PA-C  metoprolol tartrate (LOPRESSOR) 50 MG tablet Take 2 tablets (100 mg total) by mouth 2 (two) times daily. 11/06/19   Isaiah Serge, NP  nitroGLYCERIN (NITROSTAT) 0.4 MG SL tablet Place 0.4 mg under the tongue every 5 (five) minutes as needed for chest pain.  05/25/19 05/24/20  [provider]    Allergies    Ibuprofen, Metformin and related, Naproxen, Penicillins, and Yellow jacket venom [bee venom]  Review of Systems   Review of Systems  Constitutional: Negative.  Negative for activity change.  HENT: Positive for congestion.   Eyes: Negative.   Respiratory: Negative.   Cardiovascular: Negative.   Gastrointestinal: Positive for blood in stool.  Endocrine: Negative.   Genitourinary: Negative.   Musculoskeletal: Negative.   Skin: Negative.   Allergic/Immunologic: Negative.   Neurological: Negative.   Hematological: Negative.     Psychiatric/Behavioral: Negative.   All other systems reviewed and are negative.   Physical Exam Updated Vital Signs BP (!) 125/93   Pulse 99   Temp 99.2 F (37.3 C) (Oral)   Resp (!) 22   Ht 1.778 m (5\' 10" )   Wt 94.8 kg   SpO2 98%   BMI 29.99 kg/m   Physical Exam Vitals and nursing note reviewed.  Constitutional:      Appearance: He is well-developed.  HENT:     Head: Normocephalic and atraumatic.     Right Ear: External ear normal.     Left Ear: External ear normal.     Nose: Nose normal.  Eyes:     Conjunctiva/sclera: Conjunctivae normal.     Pupils: Pupils are equal, round, and reactive to light.  Cardiovascular:     Rate and Rhythm: Tachycardia present. Rhythm irregular.     Heart sounds: Normal heart sounds.  Pulmonary:     Effort: Pulmonary effort is normal.     Breath sounds: Normal breath sounds.  Abdominal:     General: Bowel sounds are normal.     Palpations: Abdomen is soft.  Musculoskeletal:        General: Normal range of motion.     Cervical back: Normal range of motion and neck supple.  Skin:    General: Skin is warm and dry.  Neurological:     Mental Status: He is alert and oriented to person, place, and time.     Deep Tendon Reflexes: Reflexes are normal and symmetric.  Psychiatric:        Behavior: Behavior normal.        Thought Content: Thought content normal.        Judgment: Judgment normal.     ED Results / Procedures / Treatments   Labs (all labs ordered are listed, but only abnormal results are displayed) Labs Reviewed  CBC - Abnormal; Notable for the following components:      Result Value   WBC 10.8 (*)    RDW 17.6 (*)    All other components within normal limits  BASIC METABOLIC PANEL - Abnormal; Notable for the following components:   Potassium 3.1 (*)    CO2 19 (*)    Glucose, Bld 117 (*)    BUN 24 (*)    All other components within normal limits  EKG EKG Interpretation  Date/Time:  Friday November 06 2019  15:58:09 EDT Ventricular Rate:  128 PR Interval:    QRS Duration: 104 QT Interval:  314 QTC Calculation: 459 R Axis:   -25 Text Interpretation: Atrial fibrillation Inferoposterior infarct, recent Lateral leads are also involved Confirmed by Pattricia Boss [phone]) on 11/06/2019 4:27:33 PM   Radiology DG Chest Port 1 View  Result Date: 11/06/2019 CLINICAL DATA:  AFib EXAM: PORTABLE CHEST 1 VIEW COMPARISON:  10/08/2019 FINDINGS: The heart size and mediastinal contours are within normal limits. Both lungs are clear. The visualized skeletal structures are unremarkable. IMPRESSION: No active disease. Electronically Signed   By: Donavan Foil M.D.   On: 11/06/2019 17:15    Procedures Procedures (including critical care time)  Medications Ordered in ED Medications  metoprolol tartrate (LOPRESSOR) injection 5 mg (5 mg Intravenous Given 11/06/19 1821)    ED Course  I have reviewed the triage vital signs and the nursing notes.  Pertinent labs & imaging results that were available during my care of the patient were reviewed by me and considered in my medical decision making (see chart for details).  Clinical Course as of Nov 06 1835  Fri Nov 06, 2019  1654 Discussed with Suanne Marker   [DR]    Clinical Course User Index [DR] Pattricia Boss, MD   MDM Rules/Calculators/A&P     CHA2DS2-VASc Score: 30                    64 year old man known history of paroxysmal atrial fibrillation not anticoagulated due to recent GI bleed and newly diagnosed colorectal cancer.  Patient had been in sinus rhythm but noted atrial fibrillation via his apple watch in the past 12 to 18 hours.  He is asymptomatic from this.  Rate has been between 90-1 50 here in the ED.  Blood pressure has been stable and is currently 125/93.  Patient had labs checked to assure that he had not had any new bleeding.  His hemoglobin is stable at 13.6.  He currently is not a candidate for anticoagulation due to the colorectal cancer and recent  bleeding.  Cardiology was consulted as he had been discussion with their office and was told to come here by them.  They currently plan to place him on Lopressor and to have him follow-up in the A. fib clinic.  I have discussed this plan with the patient and his girlfriend.  They understand return precautions and need for close follow-up and voiced understanding.   Signed,  Pattricia Boss, MD    11/06/2019 6:37 PM   Final Clinical Impression(s) / ED Diagnoses Final diagnoses:  Atrial fibrillation with RVR ([place])    Rx / DC Orders ED Discharge Orders         Ordered    metoprolol tartrate (LOPRESSOR) 50 MG tablet  2 times daily     Discontinue  Reprint     11/06/19 Vernelle Emerald           Pattricia Boss, MD 11/06/19 [phone]

## 2019-11-06 NOTE — Telephone Encounter (Signed)
Patient c/o Palpitations:  High priority if patient c/o lightheadedness, shortness of breath, or chest pain  1) How long have you had palpitations/irregular HR/ Afib? Are you having the symptoms now? Has been going on during the night , started at 12 am. No symptoms now  2) Are you currently experiencing lightheadedness, SOB or CP? no  3) Do you have a history of afib (atrial fibrillation) or irregular heart rhythm? yes  4) Have you checked your BP or HR? (document readings if available): range from 130 to 60 and "all over the place"  5) Are you experiencing any other symptoms? No other symptoms  Girlfriend, Foye Clock, who is calling,  is not on [facility]

## 2019-11-09 ENCOUNTER — Encounter: Payer: Self-pay | Admitting: *Deleted

## 2019-11-09 ENCOUNTER — Other Ambulatory Visit: Payer: Self-pay

## 2019-11-09 ENCOUNTER — Ambulatory Visit (HOSPITAL_COMMUNITY)
Admission: RE | Admit: 2019-11-09 | Discharge: 2019-11-09 | Disposition: A | Discharge status: Home / Self Care | Payer: Medicare HMO | Source: Ambulatory Visit | Attending: Nurse Practitioner | Admitting: Nurse Practitioner

## 2019-11-09 ENCOUNTER — Encounter (HOSPITAL_COMMUNITY): Payer: Self-pay | Admitting: Nurse Practitioner

## 2019-11-09 ENCOUNTER — Inpatient Hospital Stay: Payer: Medicare HMO | Admitting: Registered Nurse

## 2019-11-09 VITALS — BP 130/80 | HR 110 | Ht 70.0 in | Wt 208.6 lb

## 2019-11-09 DIAGNOSIS — Z8249 Family history of ischemic heart disease and other diseases of the circulatory system: Secondary | ICD-10-CM | POA: Diagnosis not present

## 2019-11-09 DIAGNOSIS — I499 Cardiac arrhythmia, unspecified: Secondary | ICD-10-CM | POA: Insufficient documentation

## 2019-11-09 DIAGNOSIS — I77819 Aortic ectasia, unspecified site: Secondary | ICD-10-CM | POA: Diagnosis not present

## 2019-11-09 DIAGNOSIS — I2510 Atherosclerotic heart disease of native coronary artery without angina pectoris: Secondary | ICD-10-CM | POA: Diagnosis not present

## 2019-11-09 DIAGNOSIS — C19 Malignant neoplasm of rectosigmoid junction: Secondary | ICD-10-CM | POA: Insufficient documentation

## 2019-11-09 DIAGNOSIS — Z880 Allergy status to penicillin: Secondary | ICD-10-CM | POA: Diagnosis not present

## 2019-11-09 DIAGNOSIS — E669 Obesity, unspecified: Secondary | ICD-10-CM | POA: Diagnosis not present

## 2019-11-09 DIAGNOSIS — Z8379 Family history of other diseases of the digestive system: Secondary | ICD-10-CM | POA: Diagnosis not present

## 2019-11-09 DIAGNOSIS — Z8042 Family history of malignant neoplasm of prostate: Secondary | ICD-10-CM | POA: Diagnosis not present

## 2019-11-09 DIAGNOSIS — Z7982 Long term (current) use of aspirin: Secondary | ICD-10-CM | POA: Insufficient documentation

## 2019-11-09 DIAGNOSIS — I480 Paroxysmal atrial fibrillation: Secondary | ICD-10-CM | POA: Diagnosis not present

## 2019-11-09 DIAGNOSIS — E1140 Type 2 diabetes mellitus with diabetic neuropathy, unspecified: Secondary | ICD-10-CM | POA: Insufficient documentation

## 2019-11-09 DIAGNOSIS — Z79899 Other long term (current) drug therapy: Secondary | ICD-10-CM | POA: Insufficient documentation

## 2019-11-09 DIAGNOSIS — D6869 Other thrombophilia: Secondary | ICD-10-CM

## 2019-11-09 DIAGNOSIS — I252 Old myocardial infarction: Secondary | ICD-10-CM | POA: Insufficient documentation

## 2019-11-09 DIAGNOSIS — Z95818 Presence of other cardiac implants and grafts: Secondary | ICD-10-CM | POA: Diagnosis not present

## 2019-11-09 DIAGNOSIS — I10 Essential (primary) hypertension: Secondary | ICD-10-CM | POA: Diagnosis not present

## 2019-11-09 DIAGNOSIS — E782 Mixed hyperlipidemia: Secondary | ICD-10-CM | POA: Diagnosis not present

## 2019-11-09 DIAGNOSIS — Z7984 Long term (current) use of oral hypoglycemic drugs: Secondary | ICD-10-CM | POA: Diagnosis not present

## 2019-11-09 DIAGNOSIS — Z7902 Long term (current) use of antithrombotics/antiplatelets: Secondary | ICD-10-CM | POA: Diagnosis not present

## 2019-11-09 DIAGNOSIS — Z8673 Personal history of transient ischemic attack (TIA), and cerebral infarction without residual deficits: Secondary | ICD-10-CM | POA: Diagnosis not present

## 2019-11-09 DIAGNOSIS — I4891 Unspecified atrial fibrillation: Secondary | ICD-10-CM | POA: Insufficient documentation

## 2019-11-09 DIAGNOSIS — Z6829 Body mass index (BMI) 29.0-29.9, adult: Secondary | ICD-10-CM | POA: Insufficient documentation

## 2019-11-09 MED ORDER — METOPROLOL SUCCINATE ER 100 MG PO TB24
100.0000 mg | ORAL_TABLET | Freq: Two times a day (BID) | ORAL | 3 refills | Status: DC
Start: 2019-11-09 — End: 2020-02-17

## 2019-11-09 NOTE — Progress Notes (Signed)
Reached out to Tish Men to introduce myself as the office RN Navigator and explain our new patient process. Reviewed the reason for their referral and scheduled their new patient appointment along with labs. Provided address and directions to the office including call back phone number. Reviewed with patient any concerns they may have or any possible barriers to attending their appointment.   Informed patient about my role as a navigator and that I will meet with them prior to their New Patient appointment and more fully discuss what services I can provide. At this time patient has no further questions or needs.

## 2019-11-09 NOTE — Progress Notes (Signed)
Primary Care Physician: Leonides Sake, MD Referring Physician:[facility] f/u  Cardiologist: Dr. Raquel Sarna Guidice is a 64 y.o. 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   He was asymptomatic. EPIC, Care everywhere, shows that he had afib with  MI in December at [facility], eliquis was started . Eliquis  was stopped with hospitalization in 06/2019 2/2 rectal bleeding and new colorectal CA dx. He then went on to be dx with a CVA June  10 , 2021.    However, he has noted that his heart rate had been elevated before this.last ER visit. He continues to  have some mild blood in stool  but H/H normal. He is on iron.  He continues on plavix and asa.Dr. Marlou Porch saw pt in consult with this last ER visit  and increased metoprolol tartrate to 100 mg bid  and per his note, pt would not be anticoagulated per Dr. Leonie Man  2/2 recent CVA and not until pt is post cancer treatment.   Pt is still asymptomatic with afib with RVR of 110 bpm today.  Today, he denies symptoms of palpitations, chest pain, shortness of breath, orthopnea, PND, lower extremity edema, dizziness, presyncope, syncope, or neurologic sequela. The patient is tolerating medications without difficulties and is otherwise without complaint today.   Past Medical History:  Diagnosis Date  . Allergic rhinitis    [facility]  . Anxiety   . Atrial fibrillation ([place])    [facility]  . Benign essential hypertension    [facility]  . Coronary artery disease    [facility]  . Diabetic neuropathy, type II diabetes mellitus ([place])    [facility]  . Mixed  hyperlipidemia    [facility]  . Myocardial infarct [facility])    [facility]  . Obesity, unspecified    [facility]  . Rectal bleeding    [facility]  . Wide-complex tachycardia ([place]) 06/04/2019   Past Surgical History:  Procedure Laterality Date  . BIOPSY  10/07/2019   Procedure: BIOPSY;  Surgeon: Lavena Bullion, DO;  [facility];  Service: Gastroenterology;;  . COLONOSCOPY WITH PROPOFOL N/A 10/07/2019   Procedure: COLONOSCOPY WITH PROPOFOL;  Surgeon: Lavena Bullion, DO;  [facility];  Service: Gastroenterology;  Laterality: N/A;  . CORONARY ANGIOPLASTY     3 stents  . LEFT HEART CATH AND CORONARY ANGIOGRAPHY N/A 06/05/2019   Procedure: LEFT HEART CATH AND CORONARY ANGIOGRAPHY;  Surgeon: Nelva Bush, MD;  Location: [place] CV [facility];  Service: Cardiovascular;  Laterality: N/A;  . POLYPECTOMY  10/07/2019   Procedure: POLYPECTOMY;  Surgeon: Lavena Bullion, DO;  [facility];  Service: Gastroenterology;;  . Lia Foyer TATTOO INJECTION  10/07/2019   Procedure: SUBMUCOSAL TATTOO INJECTION;  Surgeon: Lavena Bullion, DO;  [facility];  Service: Gastroenterology;;    Current Outpatient Medications  Medication Sig Dispense Refill  . acetaminophen (TYLENOL) 325 MG tablet Take 1-2 tablets (325-650 mg total) by mouth every 4 (four) hours as needed for mild pain. (Patient taking differently: Take 325-650 mg by mouth  as needed for mild pain. )    . aspirin 81 MG chewable tablet Chew 1 tablet (81 mg total) by mouth daily.    . citalopram (CELEXA) 10 MG tablet Take 0.5 tablets (5 mg total) by mouth daily. 30 tablet 0  . clonazePAM (KLONOPIN) 1 MG disintegrating tablet Take 1 tablet (1 mg total) by mouth 2 (two) times daily as needed. (Patient taking differently: Take 1 mg by mouth as needed (Anxiety). ) 30 tablet 0  . clopidogrel (PLAVIX) 75 MG tablet Take 1 tablet (75  mg total) by mouth daily. 30 tablet 0  . ezetimibe (ZETIA) 10 MG tablet Take 10 mg by mouth at bedtime.    . fluticasone (FLONASE) 50 MCG/ACT nasal spray Place 1-2 sprays into both nostrils as needed for allergies or rhinitis (or seasonal allergies).     . gabapentin (NEURONTIN) 100 MG capsule Take 1 capsule (100 mg total) by mouth at bedtime. 30 capsule 0  . glimepiride (AMARYL) 2 MG tablet Take 1 tablet (2 mg total) by mouth daily with breakfast. 30 tablet 0  . glucose blood test strip Monitor BS before meals and at bedtime for a couple of weeks. Then may decrease to bid before meals 100 each 12  . Lancets (ONETOUCH ULTRASOFT) lancets Use as instructed 100 each 12  . lisinopril (ZESTRIL) 2.5 MG tablet Take 1 tablet (2.5 mg total) by mouth daily. 30 tablet 0  . metoprolol tartrate (LOPRESSOR) 50 MG tablet Take 2 tablets (100 mg total) by mouth 2 (two) times daily. 90 tablet 0  . nitroGLYCERIN (NITROSTAT) 0.4 MG SL tablet Place 0.4 mg under the tongue every 5 (five) minutes as needed for chest pain.     . rosuvastatin (CRESTOR) 20 MG tablet Take 1 tablet (20 mg total) by mouth daily. 30 tablet 0  . traZODone (DESYREL) 50 MG tablet Take 1 tablet (50 mg total) by mouth at bedtime as needed for sleep. 30 tablet 0   No current facility-administered medications for this encounter.    Allergies  Allergen Reactions  . Ibuprofen Other (See Comments)    Was told to not take this   . Metformin And Related     Bloody stools   . Naproxen Other (See Comments)    Was told to not take this   . Penicillins Hives    Did it involve swelling of the face/tongue/throat, SOB, or low BP? Unk Did it involve sudden or severe rash/hives, skin peeling, or any reaction on the inside of your mouth or nose? Yes Did you need to seek medical attention at a hospital or doctor's office? Unk When did it last happen?"Childhood- 55 years ago" If all above answers are "NO", may proceed with cephalosporin use.   . Yellow  Jacket Venom [Bee Venom] Swelling and Other (See Comments)    Severe swelling where stung    Social History   Socioeconomic History  . Marital status: Divorced    Spouse name: Not on file  . Number of children: 2  . Years of education: Not on file  . Highest education level: Not on file  Occupational History  . Occupation: retired  Tobacco Use  . Smoking status: Never Smoker  . Smokeless tobacco: Former Systems developer    Types: Secondary school teacher  . Vaping Use: Never used  Substance and Sexual Activity  . Alcohol use: Yes    Comment: occasional  . Drug use: Yes    Types: Marijuana  . Sexual activity: Not  on file  Other Topics Concern  . 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 Difficulty of Paying Living Expenses:   Food Insecurity:   . Worried About Charity fundraiser in the Last Year:   . Arboriculturist in the Last Year:   Transportation Needs:   . Film/video editor (Medical):   Marland Kitchen Lack of Transportation (Non-Medical):   Physical Activity:   . Days of Exercise per Week:   . Minutes of Exercise per Session:   Stress:   . Feeling of Stress :   Social Connections:   . Frequency of Communication with Friends and Family:   . Frequency of Social Gatherings with Friends and Family:   . Attends Religious Services:   . Active Member of Clubs or Organizations:   . Attends Archivist Meetings:   Marland Kitchen Marital Status:   Intimate Partner Violence:   . Fear of Current or Ex-Partner:   . Emotionally Abused:   Marland Kitchen Physically Abused:   . Sexually Abused:     Family History  Problem Relation Age of Onset  . Diverticulitis Mother   . Hypertension Father   . Heart disease Father        MI  . Prostate cancer Father   . Colon cancer Neg Hx   . Stomach cancer  Neg Hx   . Pancreatic cancer Neg Hx   . Rectal cancer Neg Hx     ROS- All systems are reviewed and negative except as per the HPI above  Physical Exam: Vitals:   11/09/19 1432  Weight: 94.6 kg  Height: 5\' 10"  (1.778 m)   Wt Readings from Last 3 Encounters:  11/09/19 94.6 kg  11/06/19 94.8 kg  10/15/19 94.8 kg    Labs: Lab Results  Component Value Date   NA 139 11/06/2019   K 3.1 (L) 11/06/2019   CL 108 11/06/2019   CO2 19 (L) 11/06/2019   GLUCOSE 117 (H) 11/06/2019   BUN 24 (H) 11/06/2019   CREATININE 1.07 11/06/2019   CALCIUM 9.6 11/06/2019   MG 1.8 10/08/2019   No results found for: INR Lab Results  Component Value Date   CHOL 148 10/09/2019   HDL 26 (L) 10/09/2019   LDLCALC 98 10/09/2019   TRIG 119 10/09/2019     GEN- The patient is well appearing, alert and oriented x 3 today.   Head- normocephalic, atraumatic Eyes-  Sclera clear, conjunctiva pink Ears- hearing intact Oropharynx- clear Neck- supple, no JVP Lymph- no cervical lymphadenopathy Lungs- Clear to ausculation bilaterally, normal work of breathing Heart- irregular rate and rhythm, no murmurs, rubs or gallops, PMI not laterally displaced GI- soft, NT, ND, + BS Extremities- no clubbing, cyanosis, or edema MS- no significant deformity or atrophy Skin- no rash or lesion Psych- euthymic mood, full affect Neuro- strength and sensation are intact  EKG-afib at 110 bpm, qrs int 98 ms, qtc 473 ms   Echo- 1. Left ventricular ejection fraction, by estimation, is 60 to 65%. The  left ventricle has normal function. The left ventricle has no regional  wall motion abnormalities. Left ventricular diastolic parameters are  consistent with Grade I diastolic  dysfunction (impaired relaxation).  2. Right ventricular  systolic function is normal. The right ventricular  size is normal.  3. Left atrial size was mildly dilated.  4. The mitral valve is normal in structure. No evidence of mitral valve    regurgitation. No evidence of mitral stenosis.  5. The aortic valve is tricuspid. Aortic valve regurgitation is not  visualized. Mild to moderate aortic valve sclerosis/calcification is  present, without any evidence of aortic stenosis.  6. Aortic dilatation noted. There is mild dilatation at the level of the  sinuses of Valsalva measuring 38 mm.  7. The inferior vena cava is normal in size with greater than 50%  respiratory variability, suggesting right atrial pressure of 3 mmHg.  8. Agitated saline contrast bubble study was negative, with no evidence  of any interatrial shunt.   LHC- Conclusions: 1. Significant single-vessel coronary artery disease with diffuse mid LAD disease of up to 80-90%, extending into the ostium of D1. 2. Mild to moderate, non-obstructive coronary artery disease involving the proximal LAD, LCx and RCA. 3. Widely patent overlapping stents in the RCA extending from the ostium through the distal vessel. 4. Successful PCI to the mid LAD using Synergy 2.5 x 38 mm drug-eluting stent (post-dilated to 2.9 mm) with 0% residual stenosis and TIMI-3 flow. Jailed D2 branch exhibits 90% ostial stenosis but TIMI-3 flow. This branch was not intervened upon.  Recommendations: 1. Dual antiplatelet therapy with aspirin and ticagrelor for at least 3 months, at which point we could consider stopping aspirin given report of hematochezia. 2. I favor holding apixaban going further, as I worry that the risk of GI bleeding outweight the benefit at this time. Aggressive secondary prevention.   Assessment and Plan: 1. Afib with RVR Asymptomatic  Tracks his HR by  his apple watch  Appears was first dx in Dec 2020 with MI/stent placement  Was placed on eliquis but this was stopped with hospitalization with second stent 05/2018 with newly dx  Rectal CA/bleeding Embolic stroke 3/81  Thought 2/2 afib  ER visit this weekend with afib with RVR Dr. Leonie Man did not recommend eliquis at that  time  Note from Dr. Marlou Porch this weekend says that eliquis is still to be held per Dr. Leonie Man until chemo for his rectal CA is decided/finished and with recent CVA For better rate control, I will  change to metoprolol succinate from  tartrate to 100 mg bid  I hesitate to use diltiazem  as he has a EF of 45 % Without anticoagulation, I cannot plan for  return to SR He does see Dr. Marin Olp to describe  chemo/CA treatment plan  7/20.   I will plan for him to return to see his cardiologist within 1-2 weeks  Possibly, at some  point, he may be able to stop one of his antiplatelet  drugs ( which H/H recently  has been stable on asa/plavix) to allow for Eliquis to be added in the future when Neuro/Oncology agree   Butch Penny C. Clare Casto, [facility] [address] [place], [place] [address] [phone]

## 2019-11-10 ENCOUNTER — Encounter: Payer: Self-pay | Admitting: Registered Nurse

## 2019-11-10 ENCOUNTER — Encounter: Payer: Medicare HMO | Attending: Registered Nurse | Admitting: Registered Nurse

## 2019-11-10 ENCOUNTER — Other Ambulatory Visit: Payer: Self-pay

## 2019-11-10 VITALS — BP 130/92 | HR 136 | Temp 98.7°F | Ht 70.0 in | Wt 209.0 lb

## 2019-11-10 DIAGNOSIS — I480 Paroxysmal atrial fibrillation: Secondary | ICD-10-CM | POA: Diagnosis not present

## 2019-11-10 DIAGNOSIS — I10 Essential (primary) hypertension: Secondary | ICD-10-CM | POA: Diagnosis not present

## 2019-11-10 DIAGNOSIS — I63531 Cerebral infarction due to unspecified occlusion or stenosis of right posterior cerebral artery: Secondary | ICD-10-CM | POA: Diagnosis not present

## 2019-11-10 NOTE — Progress Notes (Signed)
Subjective:    Patient ID: John Douglas, male    DOB: 1956/04/23, 64 y.o.   MRN: [id number]  HPI: John Douglas is a 64 y.o. male who is here for transitional care visit  For follow up of his Acute ischemic Right PCA stroke, Paroxysmal Atrial Fibrillation and Essential hypertension. He presented to [facility] ED on 10/08/2019   with left leg weakness and difficulty with walking. Also reported intermittent dizziness and right sided headache. Neurology was consulted.  CT Head WO Contrast:  IMPRESSION: Findings most consistent with a right PCA territory infarct with associated petechial hemorrhage. Brain MRI with and without contrast is recommended for confirmation. MRI Brain WO Contrast:  IMPRESSION: 1. Acute infarct of the right PCA territory, which affects the posterior limb of the right internal capsule, in keeping with reported left lower extremity weakness. 2. Petechial blood within the posteromedial right temporal lobe. No mass effect. Heidelberg classification 1b: HI2, confluent petechiae, no mass effect.  CT Angio Head and Neck:  IMPRESSION: 1. Normal intracranial and cervical arteries. The right PCA is patent. 2. Unchanged appearance of right PCA territory ischemia with subacute to chronic blood products.  CT Abdomen W Contrast:  IMPRESSION: 1. Retroperitoneal and upper pelvic lymphadenopathy, compatible with metastatic disease in a patient with known rectal cancer 2. Hyperenhancing focus in the LEFT hepatic lobe with surrounding changes in parenchymal attenuation on venous phase. No imaging of this area on delayed phase. This may represent a flash filling hemangioma and appears atypical for what would be expected for rectal cancer metastasis. However, would suggest abdominal MRI for definitive assessment. 3. Mild circumferential distal esophageal thickening, nonspecific. Correlate with any symptoms of esophagitis. 4. Calcified coronary artery disease. 5. Aortic  atherosclerosis.  Aortic Atherosclerosis (ICD10-I70.0).  MRI Pelvis:  IMPRESSION: Rectal adenocarcinoma T stage: T4 a  Rectal adenocarcinoma N stage: N2, also with retroperitoneal, metastatic non regional lymph nodes.  Distance from tumor to the internal anal sphincter is approximately 4 cm cm. MRI Liver:  IMPRESSION: 16 mm lesion in the left hepatic lobe favors a flash filling hemangioma.  Small para-aortic lymph nodes measuring up to 10 mm short axis, suspicious for nodal metastases, better evaluated on recent CT.   Colon Mass positive for adenocarcinoma. He was also found to have Atrial Fibrillation with RVR. Per Dr. Cay Schillings Discharge Summary: Dr Leonie Man felt stroke was embolic due to atrial fibrillation as well as question of hypercoagulopathy due to new diagnosis of cancer.   [name] was admitted to Inpatient Rehabilitation on 10/15/2019 and discharged home on 10/23/2019. He was supposed to receive outpatient therapy at [facility], daughter states she has been calling and haven't received a return call. This provider placed a call to [facility], secretary states someone will call [name] daughter John Douglas today with an appointment. Also denies any pain.He rates his pain 0. Also states he has a good appetite.   John Douglas ( daughter), had questions regarding being instructed to provide 24 hour care, she spoke with Dr Ranell Patrick regarding the above and verbalizes understanding. [name] also wanted to drive, Dr Ranell Patrick gave Mr. Borcherding and his daughter driving instructions, they verbalize understanding.   Pain Inventory Average Pain 0 Pain Right Now 0 My pain is No pain. Here for stroke follow up.  In the last 24 hours, has pain interfered with the following? General activity 1 Relation with others 0 Enjoyment of life 0 What TIME of day is your pain at its worst? No  pain at all. Sleep (in general)  Good  Pain is worse with: no pain Pain improves with: no pain. Relief from Meds: no pain meds  Mobility use a walker how many minutes can you walk? 5 mins ability to climb steps?  yes do you drive?  no  Function retired I need assistance with the following:  bathing, household duties and shopping Do you have any goals in this area?  yes  Neuro/Psych bladder control problems weakness trouble walking anxiety  Prior Studies New patient  Physicians involved in your care New Patient   Family History  Problem Relation Age of Onset  . Diverticulitis Mother   . Hypertension Father   . Heart disease Father        MI  . Prostate cancer Father   . Colon cancer Neg Hx   . Stomach cancer Neg Hx   . Pancreatic cancer Neg Hx   . Rectal cancer Neg Hx    Social History   Socioeconomic History  . Marital status: Divorced    Spouse name: Not on file  . Number of children: 2  . Years of education: Not on file  . Highest education level: Not on file  Occupational History  . Occupation: retired  Tobacco Use  . Smoking status: Never Smoker  . Smokeless tobacco: Former Systems developer    Types: Secondary school teacher  . Vaping Use: Never used  Substance and Sexual Activity  . Alcohol use: Yes    Comment: occasional  . Drug use: Not Currently    Types: Marijuana  . Sexual activity: Not Currently  Other Topics Concern  . 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 Difficulty of Paying Living Expenses:   Food Insecurity:   . Worried About Charity fundraiser in the Last Year:   . Arboriculturist in the Last Year:   Transportation Needs:   . Film/video editor (Medical):   Marland Kitchen Lack of Transportation (Non-Medical):   Physical Activity:   . Days of Exercise per Week:   .  Minutes of Exercise per Session:   Stress:   . Feeling of Stress :   Social Connections:   . Frequency of Communication with Friends and Family:   . Frequency of Social Gatherings with Friends and Family:   . Attends Religious Services:   . Active Member of Clubs or Organizations:   . Attends Archivist Meetings:   Marland Kitchen Marital Status:    Past Surgical History:  Procedure Laterality Date  . BIOPSY  10/07/2019   Procedure: BIOPSY;  Surgeon: Lavena Bullion, DO;  [facility];  Service: Gastroenterology;;  . COLONOSCOPY WITH PROPOFOL N/A 10/07/2019   Procedure: COLONOSCOPY WITH PROPOFOL;  Surgeon: Lavena Bullion, DO;  [facility];  Service: Gastroenterology;  Laterality: N/A;  . CORONARY ANGIOPLASTY     3 stents  . LEFT HEART CATH AND CORONARY ANGIOGRAPHY N/A 06/05/2019   Procedure: LEFT HEART CATH AND CORONARY ANGIOGRAPHY;  Surgeon: Nelva Bush, MD;  Location: [place] [facility];  Service: Cardiovascular;  Laterality: N/A;  . POLYPECTOMY  10/07/2019   Procedure: POLYPECTOMY;  Surgeon: Lavena Bullion, DO;  [facility];  Service: Gastroenterology;;  .  SUBMUCOSAL TATTOO INJECTION  10/07/2019   Procedure: SUBMUCOSAL TATTOO INJECTION;  Surgeon: Lavena Bullion, DO;  [facility];  Service: Gastroenterology;;   Past Medical History:  Diagnosis Date  . Allergic rhinitis    [facility]  . Anxiety   . Atrial fibrillation ([place])    [facility]  . Benign essential hypertension    [facility]  . Coronary artery disease    [facility]  . Diabetic neuropathy, type II diabetes mellitus ([place])    [facility]  . Mixed hyperlipidemia    [facility]  . Myocardial infarct [facility])    [facility]  . Obesity, unspecified    [facility]  . Rectal bleeding    [facility]  . Wide-complex tachycardia ([place]) 06/04/2019   BP (!) 130/92   Pulse (!) 136   Temp 98.7 F (37.1 C)   Ht 5\' 10"  (1.778 m)   Wt 209 lb (94.8 kg)   SpO2 98%   BMI 29.99 kg/m   Opioid Risk Score:   Fall Risk Score:    Depression screen PHQ 2/9  Depression screen [facility] 2/9 11/10/2019 11/10/2019  Decreased Interest 0 0  Down, Depressed, Hopeless 0 0  PHQ - 2 Score 0 0  Altered sleeping 0 -  Tired, decreased energy 0 -  Change in appetite 0 -  Feeling bad or failure about yourself  0 -  Trouble concentrating 0 -  Moving slowly or fidgety/restless 0 -  Suicidal thoughts 0 -  PHQ-9 Score 0 -   Review of Systems  Constitutional: Negative.   HENT: Negative.   Eyes: Negative.   Respiratory: Negative.   Cardiovascular: Negative.   Gastrointestinal:       Frequent stools- colon cancer  Endocrine: Negative.   Genitourinary: Positive for frequency.  Musculoskeletal: Positive for gait problem and neck pain.  Skin: Negative.   Allergic/Immunologic: Negative.   Neurological: Positive for weakness.  Hematological: Negative.   Psychiatric/Behavioral:       Anxiety       Objective:   Physical Exam Vitals and nursing note reviewed.  Constitutional:      Appearance: Normal appearance.  Cardiovascular:     Rate and Rhythm: Normal rate and regular rhythm.     Pulses: Normal pulses.     Heart sounds: Normal heart sounds.  Pulmonary:     Effort: Pulmonary effort is normal.     Breath sounds: Normal breath sounds.  Musculoskeletal:     Cervical back: Normal range of motion and neck supple.     Comments: Normal Muscle Bulk and Muscle Testing Reveals:  Upper Extremities: Full ROM and Muscle Strength 5/5  Lower Extremities: Full ROM and Muscle Strength 5/5 Arises from Table with ease using walker for support Narrow Based  Gait   Skin:    General: Skin is warm and dry.  Neurological:     Mental Status: He is alert and oriented to person, place, and time.  Psychiatric:         Mood and Affect: Mood normal.        Behavior: Behavior normal.           Assessment & Plan:  1.Acute ischemic Right PCA stroke: He has an appointment with [facility] on 11/12/2019 and Has a scheduled HFU appointment with Neurology. Continue current medication rehabilitation.  2.  Paroxysmal Atrial Fibrillation: Mr.  Fuhs went to ED on 11/06/2019, discharge note was reviewed. He went to Atrial Fibrillation clinic on 11/09/2019 he states and also reports his medication was changed. Apical Puse 136 Today.  3.Essential hypertension.Continue current medication regimen. PCP Following.   40 minutes of face to face patient care time was spent during this visit. All questions were encouraged and answered.  F/U in 4-6 weeks with Dr Dagoberto Ligas

## 2019-11-12 ENCOUNTER — Other Ambulatory Visit: Payer: Self-pay

## 2019-11-12 ENCOUNTER — Telehealth: Payer: Self-pay | Admitting: Internal Medicine

## 2019-11-12 ENCOUNTER — Ambulatory Visit: Payer: Medicare HMO | Attending: Physical Medicine and Rehabilitation

## 2019-11-12 DIAGNOSIS — R2689 Other abnormalities of gait and mobility: Secondary | ICD-10-CM | POA: Diagnosis present

## 2019-11-12 DIAGNOSIS — I63531 Cerebral infarction due to unspecified occlusion or stenosis of right posterior cerebral artery: Secondary | ICD-10-CM | POA: Diagnosis present

## 2019-11-12 DIAGNOSIS — R278 Other lack of coordination: Secondary | ICD-10-CM | POA: Insufficient documentation

## 2019-11-12 DIAGNOSIS — M6281 Muscle weakness (generalized): Secondary | ICD-10-CM | POA: Diagnosis present

## 2019-11-12 DIAGNOSIS — R2681 Unsteadiness on feet: Secondary | ICD-10-CM

## 2019-11-12 NOTE — Telephone Encounter (Addendum)
LMOV TO SCHEDULE

## 2019-11-12 NOTE — Patient Instructions (Signed)
Access Code: [email or web address] URL: [email or web address] Date: 11/12/2019 Prepared by: Janna Arch  Exercises Seated Ankle Alphabet - 1 x daily - 7 x weekly - 2 sets - 10 reps - 5 hold Seated Long Arc Quad - 1 x daily - 7 x weekly - 2 sets - 10 reps - 5 hold Heel rises with counter support - 1 x daily - 7 x weekly - 2 sets - 10 reps - 5 hold Standing Hip Extension with Counter Support - 1 x daily - 7 x weekly - 2 sets - 10 reps - 5 hold Standing March with Counter Support - 1 x daily - 7 x weekly - 2 sets - 10 reps - 5 hold

## 2019-11-12 NOTE — Telephone Encounter (Signed)
-----   Message from Juluis Mire, RN sent at 11/09/2019  3:52 PM EDT ----- Regarding: appt w dr end/app Pt needs appt with Dr. Chase Picket in 7-10 days for follow up per donna carroll np Please contact pt Thanks! Stacy RN [facility]

## 2019-11-12 NOTE — Therapy (Signed)
[place] MAIN [facility] SERVICES [address] [place], [place], [address] Phone: [phone]   Fax:  [phone]  Physical Therapy Evaluation  Patient Details  Name: John Douglas MRN: [id number] Date of Birth: 04-11-1956 Referring Provider (PT): Reesa Chew    Encounter Date: 11/12/2019   PT End of Session - 11/12/19 0959    Visit Number 1    Number of Visits 16    Date for PT Re-Evaluation 01/07/20    Authorization Type 1/10 eval 11/12/19    Authorization Time Period needs FOTO for next session due to mistake with eval (was given OT rather than PT Foto )    PT Start Time 0902    PT Stop Time 1001    PT Time Calculation (min) 59 min    Equipment Utilized During Treatment Gait belt    Activity Tolerance Patient tolerated treatment well    Behavior During Therapy [facility] for tasks assessed/performed;Impulsive           Past Medical History:  Diagnosis Date  . Allergic rhinitis    [facility]  . Anxiety   . Atrial fibrillation ([place])    [facility]  . Benign essential hypertension    [facility]  . Coronary artery disease    [facility]  . Diabetic neuropathy, type II diabetes mellitus ([place])    [facility]  . Mixed hyperlipidemia    [facility]  . Myocardial infarct [facility])    [facility]  . Obesity, unspecified    [facility]  . Rectal bleeding    [facility]  . Wide-complex tachycardia ([place]) 06/04/2019    Past Surgical History:  Procedure Laterality Date  . BIOPSY  10/07/2019   Procedure: BIOPSY;  Surgeon: Lavena Bullion, DO;  Location: [facility];  Service: Gastroenterology;;  . COLONOSCOPY WITH PROPOFOL N/A 10/07/2019   Procedure: COLONOSCOPY WITH PROPOFOL;  Surgeon: Lavena Bullion, DO;  Location: [facility];  Service: Gastroenterology;   Laterality: N/A;  . CORONARY ANGIOPLASTY     3 stents  . LEFT HEART CATH AND CORONARY ANGIOGRAPHY N/A 06/05/2019   Procedure: LEFT HEART CATH AND CORONARY ANGIOGRAPHY;  Surgeon: Nelva Bush, MD;  Location: [place] [facility];  Service: Cardiovascular;  Laterality: N/A;  . POLYPECTOMY  10/07/2019   Procedure: POLYPECTOMY;  Surgeon: Lavena Bullion, DO;  Location: [facility];  Service: Gastroenterology;;  . Lia Foyer TATTOO INJECTION  10/07/2019   Procedure: SUBMUCOSAL TATTOO INJECTION;  Surgeon: Lavena Bullion, DO;  Location: [facility];  Service: Gastroenterology;;    There were no vitals filed for this visit.    Subjective Assessment - 11/12/19 0914    Subjective Patient is a pleasant 64 y/o male who presents with his daughter for LLE weakness s/p R PCA    Patient is accompained by: Family member    Pertinent History Pt is a 64 y.o. male with PMH of wide-complex tachycardia, hyperlipidemia, HTN, rectal bleeding, myocardial infarct, DM, CAD, and a fib w/ no anticoagulation. Per daughter, 10/06/2019 PM pt became dizzy and disoriented which caused him to fall. He was taking medications for colon prep for colonoscopy to have on 10/07/2019. EMS was called and bought to ED - colonoscopy was performed later that day. Pt was brought to [facility] found to have A. fib with RVR and a heart rate of 150.  CT of head was obtained  showing that he did have an infarct in the R PCA distribution. After hospital he went to inpatient rehab for 6 days. His daughter now stays will him until he is "better". . Patient went to the ED on 11/06/19 for paroxysmal a fib. Per patient and daughter is unable to have tx for cardiac issues until cancer is addressed.  Has a membership at a pool and goes there and does exercises.    How long can you stand comfortably? hasn't tried yet.    How long can you walk comfortably? uses a walker for balance, limited    Patient Stated Goals walk without walker, walk normal,  better balance.    Currently in Pain? No/denies              [facility] PT Assessment - 11/12/19 0001      Assessment   Medical Diagnosis R PCA     Referring Provider (PT) Love, Pamela     Onset Date/Surgical Date 10/07/19    Hand Dominance Right    Prior Therapy inpatient rehab       Precautions   Precautions --   watch HR      Restrictions   Weight Bearing Restrictions No      Balance Screen   Has the patient fallen in the past 6 months Yes    How many times? 0   one near fall    Has the patient had a decrease in activity level because of a fear of falling?  Yes    Is the patient reluctant to leave their home because of a fear of falling?  No      Home Environment   Living Environment Private residence    Living Arrangements --   daughter lives with him now   Available Help at Discharge Family    Type of [place] to enter    Entrance Stairs-Number of Steps 3    Entrance Stairs-Rails Right    [place] One level    [place] - 2 wheels;Grab bars - tub/shower;Hand held shower head;Shower seat      Prior Function   Level of Independence Independent    Leisure outdoor, sports, hunting, conservation farming      Cognition   Overall Cognitive Status --   repeats himself now, doesn't think he has any problems     Standardized Balance Assessment   Standardized Balance Assessment Berg Balance Test      Berg Balance Test   Sit to Stand Able to stand without using hands and stabilize independently    Standing Unsupported Able to stand 2 minutes with supervision    Sitting with Back Unsupported but Feet Supported on Floor or Stool Able to sit safely and securely 2 minutes    Stand to Sit Sits safely with minimal use of hands    Transfers Able to transfer safely, minor use of hands    Standing Unsupported with Eyes Closed Able to stand 10 seconds with supervision    Standing Unsupported with Feet Together Able to place feet together  independently but unable to hold for 30 seconds    From Standing, Reach Forward with Outstretched Arm Can reach forward >12 cm safely (5")    From Standing Position, Pick up Object from Floor Able to pick up shoe, needs supervision    From Standing Position, Turn to Look Behind Over each Shoulder Looks behind one side only/other side shows less weight shift  Turn 360 Degrees Able to turn 360 degrees safely but slowly    Standing Unsupported, Alternately Place Feet on Step/Stool Able to stand independently and complete 8 steps >20 seconds    Standing Unsupported, One Foot in Front Needs help to step but can hold 15 seconds    Standing on One Leg Tries to lift leg/unable to hold 3 seconds but remains standing independently    Total Score 40                  PAIN: Patient reports no pain   POSTURE:  Seated: right seated weight shift  Standing: hip shift to right, left foot slightly forward  PROM/AROM: ROM within functional limits for LE: L knee slight hyperextension  STRENGTH:  Graded on a 0-5 scale Muscle Group Left Right  Hip Flex 4/5 5/5  Hip Abd 3+/5 5/5  Hip Add 4-/5 5/5  Hip Ext 3+/5 5/5  Hip IR/ER 3+/5 5/5  Knee Flex 4-/5 5/5  Knee Ext 4-/5 5/5  Ankle DF 4-/5 5/5  Ankle PF 3+/5 5/5     SENSATION:   BLE :   NEUROLOGICAL SCREEN: (2+ unless otherwise noted.) N=normal  Ab=abnormal   Level Dermatome R L  L2 Medial thigh/groin N N  L3 Lower thigh/med.knee N N  L4 Medial leg/lat thigh N N  L5 Lat. leg & dorsal foot N I  S1 post/lat foot/thigh/leg N I  S2 Post./med. thigh & leg N N    SOMATOSENSORY:  Any N & T in extremities or weakness: reports :         Sensation           Intact      Diminished         Absent  Light touch LEs WFL                             COORDINATION: Finger to Nose: Dysmetric with passing of target point.  Heel shin test: limited L with poor spatial awareness of L foot.   SPECIAL TESTS: Visual field: slight deficit of far  visual field; testing limited by patient task orientation  Follow of pen: able to follow smoothly  FUNCTIONAL MOBILITY: STS: weight shift onto RLE, no UE support required   BALANCE: Dynamic Sitting Balance  Normal Able to sit unsupported and weight shift across midline maximally   Good Able to sit unsupported and weight shift across midline moderately x  Good-/Fair+ Able to sit unsupported and weight shift across midline minimally   Fair Minimal weight shifting ipsilateral/front, difficulty crossing midline   Fair- Reach to ipsilateral side and unable to weight shift   Poor + Able to sit unsupported with min A and reach to ipsilateral side, unable to weight shift   Poor Able to sit unsupported with mod A and reach ipsilateral/front-can't cross midline     Standing Dynamic Balance  Normal Stand independently unsupported, able to weight shift and cross midline maximally   Good Stand independently unsupported, able to weight shift and cross midline moderately   Good-/Fair+ Stand independently unsupported, able to weight shift across midline minimally   Fair Stand independently unsupported, weight shift, and reach ipsilaterally, loss of balance when crossing midline x  Poor+ Able to stand with Min A and reach ipsilaterally, unable to weight shift   Poor Able to stand with Mod A and minimally reach ipsilaterally, unable to cross midline.     Static  Sitting Balance  Normal Able to maintain balance against maximal resistance   Good Able to maintain balance against moderate resistance x  Good-/Fair+ Accepts minimal resistance   Fair Able to sit unsupported without balance loss and without UE support   Poor+ Able to maintain with Minimal assistance from individual or chair   Poor Unable to maintain balance-requires mod/max support from individual or chair     Static Standing Balance  Normal Able to maintain standing balance against maximal resistance   Good Able to maintain standing  balance against moderate resistance   Good-/Fair+ Able to maintain standing balance against minimal resistance   Fair Able to stand unsupported without UE support and without LOB for 1-2 min x  Fair- Requires Min A and UE support to maintain standing without loss of balance   Poor+ Requires mod A and UE support to maintain standing without loss of balance   Poor Requires max A and UE support to maintain standing balance without loss       GAIT:  Assessment of gait w/o AD: heel strike of L with limited consistency (requires patient focus), poor toe off/propulsion of LLE, limited weight acceptance onto LLE, slight L knee hyperextension upon weight shift  OUTCOME MEASURES: TEST Outcome Interpretation  5 times sit<>stand 8.7 sec w/o UE support  >15 yo, >15 sec indicates increased risk for falls  10 meter walk test   12.7 standard 8.8 fast              m/s <1.0 m/s indicates increased risk for falls; limited community ambulator          Oceanographer Assessment 40/56  <36/56 (100% risk for falls), 37-45 (80% risk for falls); 46-51 (>50% risk for falls); 52-55 (lower risk <25% of falls)  ABC 64.3%      Patient is a pleasant 64 year old male who presents with his daughter for physical therapy s/p acute ischemic R PCA stroke affecting LLE. Patient demonstrates LLE weakness with decreased coordination/spatial awareness of LLE and UE. Gait is functional with device however does require CGA without AD. Patient is very motivated to return to high prior level of function. Patient is limited however by cardiac precautions, his HR is standard ~130 however is unable to undergo treatment for it until cancer is treated per patient report.  Patient will benefit from skilled physical therapy to increase LLE strengthening, stability, and capacity for prolonged mobility.    Objective measurements completed on examination: See above findings.      Access Code: [email or web address] URL:  [email or web address] Date: 11/12/2019 Prepared by: Janna Arch  Exercises Seated Ankle Alphabet - 1 x daily - 7 x weekly - 2 sets - 10 reps - 5 hold Seated Long Arc Quad - 1 x daily - 7 x weekly - 2 sets - 10 reps - 5 hold Heel rises with counter support - 1 x daily - 7 x weekly - 2 sets - 10 reps - 5 hold Standing Hip Extension with Counter Support - 1 x daily - 7 x weekly - 2 sets - 10 reps - 5 hold Standing March with Counter Support - 1 x daily - 7 x weekly - 2 sets - 10 reps - 5 hold           PT Education - 11/12/19 1239    Education Details goals, POC, HEP    Person(s) Educated Patient;Child(ren)    Methods Explanation;Demonstration;Tactile cues;Verbal cues;Handout    Comprehension Verbalized understanding;Returned demonstration;Verbal cues required;Tactile  cues required            PT Short Term Goals - 11/12/19 1245      PT SHORT TERM GOAL #1   Title Patient will be independent in home exercise program to improve strength/mobility for better functional independence with ADLs.    Baseline 7/15: HEP given    Time 4    Period Weeks    Status New    Target Date 12/10/19             PT Long Term Goals - 11/12/19 1245      PT LONG TERM GOAL #1   Title Patient will increase FOTO score to equal to or greater than     to demonstrate statistically significant improvement in mobility and quality of life.    Baseline 7/15: FOTO to do next session    Time 8    Period Weeks    Status New    Target Date 01/07/20      PT LONG TERM GOAL #2   Title Patient will demonstrate an improved Berg Balance Score of >46/56  as to demonstrate improved balance with ADLs such as sitting/standing and transfer balance and reduced fall risk.    Baseline 7/15: 40/56    Time 8    Period Weeks    Status New    Target Date 01/07/20      PT LONG TERM GOAL #3   Title Patient will increase ABC scale score >80% to demonstrate better functional mobility and better  confidence with ADLs.    Baseline 7/15: 64.3%    Time 8    Period Weeks    Status New    Target Date 01/07/20      PT LONG TERM GOAL #4   Title Patient will increase BLE gross strength to 4+/5 as to improve functional strength for independent gait, increased standing tolerance and increased ADL ability.    Baseline 7/15: see note    Time 8    Period Weeks    Status New    Target Date 01/07/20                  Plan - 11/12/19 1242    Clinical Impression Statement Patient is a pleasant 64 year old male who presents with his daughter for physical therapy s/p acute ischemic R PCA stroke affecting LLE. Patient demonstrates LLE weakness with decreased coordination/spatial awareness of LLE and UE. Gait is functional with device however does require CGA without AD. Patient is very motivated to return to high prior level of function. Patient is limited however by cardiac precautions, his HR is standard ~130 however is unable to undergo treatment for it until cancer is treated per patient report.  Patient will benefit from skilled physical therapy to increase LLE strengthening, stability, and capacity for prolonged mobility.    Personal Factors and Comorbidities Comorbidity 3+;Time since onset of injury/illness/exacerbation;Transportation;Past/Current Experience    Comorbidities GAD, DM, leukocytosis, colon cancer, CAD, HTN, NSTMI, ventricular tachycardia    Examination-Activity Limitations Caring for [employer];Locomotion Level;Stand;Toileting;Transfers    Examination-Participation Restrictions Church;Cleaning;Community Activity;Driving;Laundry;Volunteer;Shop;Meal Prep;Yard Work    [employer]chant [employer] officer Evolving/Moderate complexity    Clinical Decision Making Moderate    Rehab Potential Fair    PT Frequency 2x / week    PT Duration 8 weeks    PT Treatment/Interventions ADLs/Self Care Home Management;Aquatic Therapy;Biofeedback;Canalith  Repostioning;Cryotherapy;Moist Heat;Traction;DME Instruction;Gait training;Stair training;Functional mobility training;Therapeutic activities;Patient/family education;Neuromuscular re-education;Balance training;Therapeutic exercise;Manual techniques;Energy conservation;Dry needling;Passive range of motion;Taping;Vasopneumatic  Device;Vestibular    PT Next Visit Plan FOTO; balance on airex pad    PT Home Exercise Plan see above    Consulted and Agree with Plan of Care Patient;Family member/caregiver    Family Member Consulted daughter           Patient will benefit from skilled therapeutic intervention in order to improve the following deficits and impairments:  Abnormal gait, Cardiopulmonary status limiting activity, Decreased activity tolerance, Decreased balance, Decreased knowledge of precautions, Decreased endurance, Decreased coordination, Decreased mobility, Difficulty walking, Decreased strength, Impaired UE functional use, Postural dysfunction, Improper body mechanics  Visit Diagnosis: Unsteadiness on feet  Muscle weakness (generalized)  Other abnormalities of gait and mobility     Problem List Patient Active Problem List   Diagnosis Date Noted  . Generalized anxiety disorder 10/26/2019  . Diabetes mellitus ([place]) 10/26/2019  . Acute ischemic right PCA stroke ([place]) 10/15/2019  . Ischemic stroke ([place]) 10/08/2019  . Leukocytosis   . Hypokalemia   . Colon cancer ([place])   . Change in bowel habits   . Rectal bleeding   . Rectal mass   . Rectal polyp   . Polyp of descending colon   . Diverticulosis of colon without hemorrhage   . Gastrointestinal hemorrhage 08/29/2019  . Coronary artery disease 08/29/2019  . Ischemic cardiomyopathy 08/29/2019  . Paroxysmal atrial fibrillation ([facility])   . Essential hypertension   . Hyperlipidemia LDL goal <70   . Non-ST elevation (NSTEMI) myocardial infarction ([place])   . Ventricular tachycardia ([place]) 06/04/2019   Janna Arch, PT, DPT    11/12/2019, 12:50 PM  [place] MAIN [facility] SERVICES [address] [place], [place], [address] Phone: [phone]   Fax:  [phone]  Name: John Douglas MRN: [id number] Date of Birth: [birth year]

## 2019-11-12 NOTE — Telephone Encounter (Signed)
LMOV to schedule  

## 2019-11-14 ENCOUNTER — Other Ambulatory Visit: Payer: Self-pay | Admitting: Physical Medicine and Rehabilitation

## 2019-11-16 ENCOUNTER — Other Ambulatory Visit: Payer: Self-pay

## 2019-11-16 ENCOUNTER — Encounter: Payer: Self-pay | Admitting: Occupational Therapy

## 2019-11-16 ENCOUNTER — Ambulatory Visit: Payer: Medicare HMO | Admitting: Occupational Therapy

## 2019-11-16 DIAGNOSIS — M6281 Muscle weakness (generalized): Secondary | ICD-10-CM

## 2019-11-16 DIAGNOSIS — R2681 Unsteadiness on feet: Secondary | ICD-10-CM | POA: Diagnosis not present

## 2019-11-16 DIAGNOSIS — R278 Other lack of coordination: Secondary | ICD-10-CM

## 2019-11-16 NOTE — Therapy (Signed)
[place] MAIN [facility] SERVICES [address] [place], [place], [address] Phone: [phone]   Fax:  [phone]  Occupational Therapy Evaluation  Patient Details  Name: John Douglas MRN: [id number] Date of Birth: November 12, 1955 No data recorded  Encounter Date: 11/16/2019   OT End of Session - 11/16/19 1156    Visit Number 1    Number of Visits 1    Date for OT Re-Evaluation 11/16/19    OT Start Time 1005    OT Stop Time 1100    OT Time Calculation (min) 55 min    Activity Tolerance Patient tolerated treatment well;Patient limited by pain    Behavior During Therapy [facility] for tasks assessed/performed;Impulsive           Past Medical History:  Diagnosis Date  . Allergic rhinitis    [facility]  . Anxiety   . Atrial fibrillation ([place])    [facility]  . Benign essential hypertension    [facility]  . Coronary artery disease    [facility]  . Diabetic neuropathy, type II diabetes mellitus ([place])    [facility]  . Mixed hyperlipidemia    [facility]  . Myocardial infarct [facility])    [facility]  . Obesity, unspecified    [facility]  . Rectal bleeding    [facility]  . Wide-complex tachycardia ([place]) 06/04/2019    Past Surgical History:  Procedure Laterality Date  . BIOPSY  10/07/2019   Procedure: BIOPSY;  Surgeon: Lavena Bullion, DO;  [facility];  Service: Gastroenterology;;  . COLONOSCOPY WITH PROPOFOL N/A 10/07/2019   Procedure: COLONOSCOPY WITH PROPOFOL;  Surgeon: Lavena Bullion, DO;  [facility];  Service: Gastroenterology;  Laterality: N/A;  . CORONARY ANGIOPLASTY     3 stents  . LEFT HEART CATH AND CORONARY ANGIOGRAPHY N/A 06/05/2019   Procedure: LEFT HEART CATH AND CORONARY ANGIOGRAPHY;  Surgeon: Nelva Bush, MD;   Location: [place] CV LAB;  Service: Cardiovascular;  Laterality: N/A;  . POLYPECTOMY  10/07/2019   Procedure: POLYPECTOMY;  Surgeon: Lavena Bullion, DO;  [facility];  Service: Gastroenterology;;  . Lia Foyer TATTOO INJECTION  10/07/2019   Procedure: SUBMUCOSAL TATTOO INJECTION;  Surgeon: Lavena Bullion, DO;  [facility];  Service: Gastroenterology;;    There were no vitals filed for this visit.   Subjective Assessment - 11/16/19 1158    Subjective  Pt. reports that he has been cleared to drive    Pertinent History Pt. is a 64 y.o. male who was diagnosed with a right PCA CVA. Pt. reports a recent diagnosis of  Colon CA. PMHx includes: HTN, DM, Afib, and MIs    Patient Stated Goals To regain independence    Currently in Pain? No/denies             [facility] OT Assessment - 11/16/19 0001      Assessment   Medical Diagnosis Right PCA CVA    Onset Date/Surgical Date 10/07/19    Hand Dominance Right    Prior Therapy Inpatient Rehab      Restrictions   Weight Bearing Restrictions No      Balance Screen   Has the patient fallen in the past 6 months No    Has the patient had a decrease in activity level because of a fear of falling?  No  Is the patient reluctant to leave their home because of a fear of falling?  No      Home  Environment   Family/patient expects to be discharged to: Private residence    Living Arrangements Alone    Available Help at Discharge Family    Type of [place] One level    Bathroom Shower/Tub Tub/Shower unit;Curtain    Shower/tub characteristics Curtain    Bathroom Accessibility No    [place] - 2 wheels;Cane -quad;Bedside commode;Shower seat;Grab bars - toilet;Grab bars - tub/shower;Hand held shower head    Lives With Alone      Prior Function   Level of Independence Independent    Vocation Retired    [employer], hunting,  outdoor Astronomer, travel      ADL   Eating/Feeding Independent    Grooming Independent    Designer, television/film set -  Product/process development scientist Independent      IADL   Prior Level of Conservation officer, historic buildings for transportation;Shops independently for [employer];Needs to be accompanied on any shopping trip    Prior Level of Function Light Housekeeping Independent    Light Housekeeping Does personal laundry completely    Prior Level of Function Meal Prep Independent    Meal Prep Able to complete simple warm meal prep;Able to complete simple cold meal and snack prep    Prior Level of Function Medication Managment Independent    Medication Management Is responsible for taking medication in correct dosages at correct time    Prior Level of Function Financial Management Independent    Financial Management Manages financial matters independently (budgets, writes checks, pays rent, bills goes to bank), collects and keeps track of income      Mobility   Mobility Status Needs assist      Written Expression   Dominant Hand Right      Vision - History   Baseline Vision Wears glasses only for reading    Patient Visual Report Peripheral vision impairment   Left     Vision Assessment   Visual Fields --   WFL     Activity Tolerance   Activity Tolerance Tolerate 30+ min activity without fatigue      Cognition   Overall Cognitive Status Within Functional Limits for tasks assessed      Sensation   Light Touch Appears Intact    Proprioception Appears Intact      Coordination   Gross Motor Movements are Fluid and Coordinated Yes    Fine Motor Movements are Fluid and Coordinated Yes    Right 9 Hole Peg Test 26    Left 9 Hole Peg Test 34      AROM   Overall AROM  Comments BUE AROM WNL      Strength   Overall Strength Comments BUE strength: 5/5 overall      Hand Function   Right Hand Grip (lbs) 90#    Right Hand Lateral Pinch 29 lbs    Right Hand 3 Point Pinch 19 lbs    Left Hand Grip (lbs) 88#    Left Hand Lateral Pinch 25 lbs  Left 3 point pinch 20 lbs                           OT Education - 11/16/19 1155    Education Details OT services, Pt. current functional level    Person(s) Educated Patient    Methods Demonstration    Comprehension Verbalized understanding;Returned demonstration                      Plan - 11/16/19 1202    Clinical Impression Statement Pt. is a 64 y.o. male who was diagnosed with a CVA in the RIght PCA. Pt. reports visual changes initially in the left perfopheral field which has since resolved. Pt. has been cleared to return to driving. Pt. has resumed performing basic ADLs, and showering independently. Pt. is able to complete laundry tasks, and perform light meal prep, although eats out most of the time. Pt. has been cleared for driving, however his family drives most of the time for shopping, and errands. Pt. is independent with medication management using a pillbox, and is independent with managing monthly finances. Pt.'s BUE strength, grip strength, and [place] skills are [facility]. FOTO intake score is 89. Pt. participated in an Eval only. No further OT serivces treatment plan, or goals are warranted at this time.    Occupational performance deficits (Please refer to evaluation for details): ADL's;IADL's;Education    Body Structure / Function / Physical Skills ADL;IADL;Pain;ROM;FMC;UE functional use;Coordination    Rehab Potential Excellent    Clinical Decision Making Several treatment options, min-mod task modification necessary    Comorbidities Affecting Occupational Performance: May have comorbidities impacting occupational performance    Modification or Assistance to Complete Evaluation   Min-Moderate modification of tasks or assist with assess necessary to complete eval    OT Treatment/Interventions Self-care/ADL training;Therapeutic exercise;Neuromuscular education;Patient/family education;Therapeutic activities;DME and/or AE instruction;Energy conservation    Consulted and Agree with Plan of Care Patient           Patient will benefit from skilled therapeutic intervention in order to improve the following deficits and impairments:   Body Structure / Function / Physical Skills: ADL, IADL, Pain, ROM, FMC, UE functional use, Coordination       Visit Diagnosis: Muscle weakness (generalized)  Other lack of coordination    Problem List Patient Active Problem List   Diagnosis Date Noted  . Generalized anxiety disorder 10/26/2019  . Diabetes mellitus ([place]) 10/26/2019  . Acute ischemic right PCA stroke ([place]) 10/15/2019  . Ischemic stroke ([place]) 10/08/2019  . Leukocytosis   . Hypokalemia   . Colon cancer ([place])   . Change in bowel habits   . Rectal bleeding   . Rectal mass   . Rectal polyp   . Polyp of descending colon   . Diverticulosis of colon without hemorrhage   . Gastrointestinal hemorrhage 08/29/2019  . Coronary artery disease 08/29/2019  . Ischemic cardiomyopathy 08/29/2019  . Paroxysmal atrial fibrillation ([facility])   . Essential hypertension   . Hyperlipidemia LDL goal <70   . Non-ST elevation (NSTEMI) myocardial infarction ([place])   . Ventricular tachycardia ([place]) 06/04/2019    Harrel Carina, MS, OTR/L 11/16/2019, 12:19 PM  [place] MAIN [facility] SERVICES [address] [place], [place], [address] Phone: [phone]   Fax:  [phone]  Name: Corrigan Kretschmer MRN: [id number] Date of Birth: Feb 23, 1956

## 2019-11-17 ENCOUNTER — Inpatient Hospital Stay: Payer: Medicare HMO | Admitting: Hematology & Oncology

## 2019-11-17 ENCOUNTER — Telehealth: Payer: Self-pay | Admitting: Internal Medicine

## 2019-11-17 ENCOUNTER — Encounter: Payer: Self-pay | Admitting: *Deleted

## 2019-11-17 ENCOUNTER — Other Ambulatory Visit: Payer: Self-pay

## 2019-11-17 ENCOUNTER — Encounter: Payer: Self-pay | Admitting: Hematology & Oncology

## 2019-11-17 ENCOUNTER — Inpatient Hospital Stay: Payer: Medicare HMO | Attending: Hematology & Oncology

## 2019-11-17 VITALS — BP 115/79 | HR 130 | Temp 98.2°F | Resp 18 | Wt 208.0 lb

## 2019-11-17 DIAGNOSIS — C775 Secondary and unspecified malignant neoplasm of intrapelvic lymph nodes: Secondary | ICD-10-CM | POA: Diagnosis not present

## 2019-11-17 DIAGNOSIS — D649 Anemia, unspecified: Secondary | ICD-10-CM | POA: Diagnosis not present

## 2019-11-17 DIAGNOSIS — C186 Malignant neoplasm of descending colon: Secondary | ICD-10-CM

## 2019-11-17 DIAGNOSIS — Z5111 Encounter for antineoplastic chemotherapy: Secondary | ICD-10-CM | POA: Insufficient documentation

## 2019-11-17 DIAGNOSIS — Z7982 Long term (current) use of aspirin: Secondary | ICD-10-CM | POA: Diagnosis not present

## 2019-11-17 DIAGNOSIS — D500 Iron deficiency anemia secondary to blood loss (chronic): Secondary | ICD-10-CM | POA: Diagnosis not present

## 2019-11-17 DIAGNOSIS — Z7901 Long term (current) use of anticoagulants: Secondary | ICD-10-CM | POA: Diagnosis not present

## 2019-11-17 DIAGNOSIS — C20 Malignant neoplasm of rectum: Secondary | ICD-10-CM | POA: Insufficient documentation

## 2019-11-17 DIAGNOSIS — I4891 Unspecified atrial fibrillation: Secondary | ICD-10-CM | POA: Insufficient documentation

## 2019-11-17 HISTORY — DX: Malignant neoplasm of rectum: C20

## 2019-11-17 HISTORY — DX: Secondary and unspecified malignant neoplasm of intrapelvic lymph nodes: C77.5

## 2019-11-17 LAB — CBC WITH DIFFERENTIAL (CANCER CENTER ONLY)
Abs Immature Granulocytes: 0.05 10*3/uL (ref 0.00–0.07)
Basophils Absolute: 0 10*3/uL (ref 0.0–0.1)
Basophils Relative: 0 %
Eosinophils Absolute: 0.1 10*3/uL (ref 0.0–0.5)
Eosinophils Relative: 1 %
HCT: 27.7 % — ABNORMAL LOW (ref 39.0–52.0)
Hemoglobin: 9.3 g/dL — ABNORMAL LOW (ref 13.0–17.0)
Immature Granulocytes: 1 %
Lymphocytes Relative: 18 %
Lymphs Abs: 1.7 10*3/uL (ref 0.7–4.0)
MCH: 28.9 pg (ref 26.0–34.0)
MCHC: 33.6 g/dL (ref 30.0–36.0)
MCV: 86 fL (ref 80.0–100.0)
Monocytes Absolute: 0.6 10*3/uL (ref 0.1–1.0)
Monocytes Relative: 7 %
Neutro Abs: 6.8 10*3/uL (ref 1.7–7.7)
Neutrophils Relative %: 73 %
Platelet Count: 252 10*3/uL (ref 150–400)
RBC: 3.22 MIL/uL — ABNORMAL LOW (ref 4.22–5.81)
RDW: 17.6 % — ABNORMAL HIGH (ref 11.5–15.5)
WBC Count: 9.3 10*3/uL (ref 4.0–10.5)
nRBC: 0 % (ref 0.0–0.2)

## 2019-11-17 LAB — CMP (CANCER CENTER ONLY)
ALT: 10 U/L (ref 0–44)
AST: 10 U/L — ABNORMAL LOW (ref 15–41)
Albumin: 4.1 g/dL (ref 3.5–5.0)
Alkaline Phosphatase: 41 U/L (ref 38–126)
Anion gap: 8 (ref 5–15)
BUN: 17 mg/dL (ref 8–23)
CO2: 27 mmol/L (ref 22–32)
Calcium: 9.3 mg/dL (ref 8.9–10.3)
Chloride: 106 mmol/L (ref 98–111)
Creatinine: 1.14 mg/dL (ref 0.61–1.24)
GFR, Est AFR Am: >60 mL/min (ref >60)
GFR, Estimated: >60 mL/min (ref >60)
Glucose, Bld: 94 mg/dL (ref 70–99)
Potassium: 3.5 mmol/L (ref 3.5–5.1)
Sodium: 141 mmol/L (ref 135–145)
Total Bilirubin: 0.9 mg/dL (ref 0.3–1.2)
Total Protein: 6.3 g/dL — ABNORMAL LOW (ref 6.5–8.1)

## 2019-11-17 NOTE — Telephone Encounter (Signed)
Has diarrhea for 2 days. Spoke to PCP and was told to rehydrate.   This morning HR 130, BP 115/92.  Was seen by a fib clinic a few weeks ago and per pt report, they said "they couldn't do anything because he is maxed out on metoprolol dose".   Pt not currently on blood thinner d/t GI bleeding.   I advised pt and daughter an appt in office is warranted.   Denies dizziness, weakness. Denies chest pain.   Denies palpitations.   Made appt with primary cardiologist tomorrow afternoon at 3:40P.   Pt agreeable to POC. Routing to Dr. Saunders Revel to ensure agreement with POC.

## 2019-11-17 NOTE — Telephone Encounter (Signed)
Please call regarding Metoprolol doseage, patient daughter states he has been in afib since early July.  Patient c/o Palpitations:  High priority if patient c/o lightheadedness, shortness of breath, or chest pain  1) How long have you had palpitations/irregular HR/ Afib? Are you having the symptoms now? no  2) Are you currently experiencing lightheadedness, SOB or CP? no  3) Do you have a history of afib (atrial fibrillation) or irregular heart rhythm? yes  4) Have you checked your BP or HR? (document readings if available):  BP yesterday 95/75. HR 136  5) Are you experiencing any other symptoms? Heart is racing

## 2019-11-17 NOTE — Progress Notes (Signed)
START ON PATHWAY REGIMEN - Colorectal     A cycle is every 14 days:     Panitumumab      Oxaliplatin      Leucovorin      Fluorouracil      Fluorouracil   **Always confirm dose/schedule in your pharmacy ordering system**  Patient Characteristics: Distant Metastases, Nonsurgical Candidate, KRAS/NRAS Wild-Type (BRAF V600 Wild-Type/Unknown), Standard Cytotoxic Therapy, First Line Standard Cytotoxic Therapy, Left-sided Tumor, PS = 0,1 Tumor Location: Rectal Therapeutic Status: Distant Metastases Microsatellite/Mismatch Repair Status: MSS/pMMR BRAF Mutation Status: Wild-Type (no mutation) KRAS/NRAS Mutation Status: Wild-Type (no mutation) Preferred Therapy Approach: Standard Cytotoxic Therapy Standard Cytotoxic Line of Therapy: First Line Standard Cytotoxic Therapy ECOG Performance Status: 1 Primary Tumor Location: Left-sided Tumor Intent of Therapy: Non-Curative / Palliative Intent, Discussed with Patient

## 2019-11-17 NOTE — Telephone Encounter (Signed)
I agree with plan as outlined.  Mr. Kestler should continue to hydrate as tolerated.  We will f/u in the office tomorrow, as scheduled.  Nelva Bush, MD [facility]

## 2019-11-17 NOTE — Progress Notes (Signed)
Initial RN Navigator Patient Visit  Name: John Douglas Diagnosis: Rectal Ca  Patient completed workup and was seen by Dr Marin Olp while an inpatient at [facility].   Met with patient prior to their visit with MD. Hanley Seamen patient "Your Patient Navigator" handout which explains my role, areas in which I am able to help, and all the contact information for myself and the office. Also gave patient MD and Navigator business card. Reviewed with patient the general overview of expected course after initial diagnosis and time frame for all steps to be completed.  New patient packet given to patient which includes: orientation to office and staff; campus directory; education on My Chart and Advance Directives; and patient centered education on Colorectal Cancer.   Patient completed visit with Dr. Marin Olp  Patient will need  Port placement - scheduled for 11/24/2019. Patient and family received all education regarding arrival time, driver and NPO status.  Initiation of chemo - scheduled for 11/25/2019. Chemo education will be completed that morning.   Patient understands all follow up procedures and expectations. They have my number to reach out for any further clarification or additional needs.

## 2019-11-18 ENCOUNTER — Encounter: Payer: Self-pay | Admitting: Hematology & Oncology

## 2019-11-18 ENCOUNTER — Other Ambulatory Visit: Payer: Self-pay | Admitting: Physical Medicine and Rehabilitation

## 2019-11-18 ENCOUNTER — Ambulatory Visit: Payer: Medicare HMO | Admitting: Internal Medicine

## 2019-11-18 ENCOUNTER — Encounter: Payer: Self-pay | Admitting: Physical Therapy

## 2019-11-18 ENCOUNTER — Encounter: Payer: Self-pay | Admitting: Internal Medicine

## 2019-11-18 ENCOUNTER — Encounter: Payer: Medicare HMO | Admitting: Occupational Therapy

## 2019-11-18 ENCOUNTER — Ambulatory Visit: Payer: Medicare HMO

## 2019-11-18 VITALS — BP 126/84 | HR 129 | Ht 70.0 in | Wt 211.1 lb

## 2019-11-18 DIAGNOSIS — I2510 Atherosclerotic heart disease of native coronary artery without angina pectoris: Secondary | ICD-10-CM | POA: Diagnosis not present

## 2019-11-18 DIAGNOSIS — R278 Other lack of coordination: Secondary | ICD-10-CM

## 2019-11-18 DIAGNOSIS — M6281 Muscle weakness (generalized): Secondary | ICD-10-CM

## 2019-11-18 DIAGNOSIS — R2689 Other abnormalities of gait and mobility: Secondary | ICD-10-CM

## 2019-11-18 DIAGNOSIS — I4892 Unspecified atrial flutter: Secondary | ICD-10-CM

## 2019-11-18 DIAGNOSIS — C20 Malignant neoplasm of rectum: Secondary | ICD-10-CM

## 2019-11-18 DIAGNOSIS — I255 Ischemic cardiomyopathy: Secondary | ICD-10-CM | POA: Diagnosis not present

## 2019-11-18 DIAGNOSIS — I63439 Cerebral infarction due to embolism of unspecified posterior cerebral artery: Secondary | ICD-10-CM | POA: Diagnosis not present

## 2019-11-18 DIAGNOSIS — C775 Secondary and unspecified malignant neoplasm of intrapelvic lymph nodes: Secondary | ICD-10-CM

## 2019-11-18 DIAGNOSIS — D500 Iron deficiency anemia secondary to blood loss (chronic): Secondary | ICD-10-CM

## 2019-11-18 DIAGNOSIS — R2681 Unsteadiness on feet: Secondary | ICD-10-CM | POA: Diagnosis not present

## 2019-11-18 DIAGNOSIS — I4891 Unspecified atrial fibrillation: Secondary | ICD-10-CM

## 2019-11-18 DIAGNOSIS — I63531 Cerebral infarction due to unspecified occlusion or stenosis of right posterior cerebral artery: Secondary | ICD-10-CM

## 2019-11-18 HISTORY — DX: Iron deficiency anemia secondary to blood loss (chronic): D50.0

## 2019-11-18 LAB — CEA (IN HOUSE-CHCC): CEA (CHCC-In House): 10.46 ng/mL — ABNORMAL HIGH (ref 0.00–5.00)

## 2019-11-18 LAB — LACTATE DEHYDROGENASE: LDH: 183 U/L (ref 98–192)

## 2019-11-18 MED ORDER — DILTIAZEM HCL ER COATED BEADS 180 MG PO CP24: 180.0000 mg | ORAL_CAPSULE | Freq: Every day | ORAL | 0 refills | Status: DC

## 2019-11-18 NOTE — Progress Notes (Signed)
Follow-up Outpatient Visit Date: 11/18/2019  Primary Care Provider: Leonides Sake, MD [place] [address]  Chief Complaint: Elevated heart rate  HPI:  Mr. Worlds is a 64 y.o. male with history of coronary artery disease with inferior STEMI and primary PCI to the RCA in 85/2778 complicated by sustained ventricular tachycardia in the setting of residual left coronary artery disease status post subsequent PCI to the mid LAD (06/2019),hypertension, hyperlipidemia, paroxysmal atrial fibrillation complicated by stroke in 09/2019, ischemic cardiomyopathy, type 2 diabetes mellitus, and recurrent GI bleeding with recent diagnosis of rectal cancer, who presents for urgent evaluation of atrial fibrillation with rapid ventricular response.  I last saw him in early June, at which time he was feeling well other than chronic abdominal symptoms for which colonoscopy was planned.  He ultimately was found to have a partially obstructing mass in the proximal rectum with associated oozing.  The next day, he presented back to the ED with left leg weakness as well as dizziness and right-sided headache.  He was found to have a right PCA territory infarct.  He was subsequently transferred to acute inpatient rehab and ultimately discharged home on 6/25.  [facility] in [facility] on [facility]/9 with elevated heart rates.  His Apple Watch also notified him that he was back in atrial fibrillation.  He was seen in the a-fib clinc on 7/12, at which time he was still in atrial fibrillation.  Neurology had previously recommended deferring apixaban until after workup/treatment of bleeding rectal cancer had been completed.  Today, Mr. Beach reports that he is feeling relatively well.  He monitors his heart rate using his apple watch and has been seeing readings between 120 and 140 bpm most of the time.  He only has rare episodes during which his heart rate is less than 100 bpm.  He is unaware of his tachycardia, denying  palpitations as well as chest pain, shortness of breath, and lightheadedness.  He has noted some intermittent diarrhea and also had a small amount of blood mixed with his stool earlier this week.  He is scheduled for port placement tomorrow and initiation of chemotherapy.  Mr. Borba remains compliant with his medications, including metoprolol that was recently transition to metoprolol succinate 100 mg twice daily.  He is also on dual antiplatelet therapy with aspirin and clopidogrel.  His blood pressures have been running somewhat low, though they seem to have improved in the last few days with increased fluid intake.  --------------------------------------------------------------------------------------------------  Past Medical History:  Diagnosis Date  . Allergic rhinitis    [facility]  . Anxiety   . Atrial fibrillation ([place])    [facility]  . Benign essential hypertension    [facility]  . Coronary artery disease    [facility]  . Diabetic neuropathy, type II diabetes mellitus ([place])    [facility]  . Iron deficiency anemia due to chronic blood loss 11/18/2019  . Mixed hyperlipidemia    [facility]  . Myocardial infarct [facility])    [facility]  . Obesity, unspecified    [facility]  . Rectal bleeding    [facility]  . 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 ([place]) 11/17/2019  . Wide-complex tachycardia ([place]) 06/04/2019   Past Surgical History:  Procedure Laterality Date  . BIOPSY  10/07/2019   Procedure: BIOPSY;  Surgeon: Gerrit Heck  V, DO;  [facility];  Service: Gastroenterology;;  . COLONOSCOPY WITH PROPOFOL N/A 10/07/2019   Procedure: COLONOSCOPY WITH PROPOFOL;  Surgeon: Lavena Bullion, DO;  [facility];  Service: Gastroenterology;  Laterality: N/A;  . CORONARY  ANGIOPLASTY     3 stents  . LEFT HEART CATH AND CORONARY ANGIOGRAPHY N/A 06/05/2019   Procedure: LEFT HEART CATH AND CORONARY ANGIOGRAPHY;  Surgeon: Nelva Bush, MD;  Location: [place] CV LAB;  Service: Cardiovascular;  Laterality: N/A;  . POLYPECTOMY  10/07/2019   Procedure: POLYPECTOMY;  Surgeon: Lavena Bullion, DO;  [facility];  Service: Gastroenterology;;  . Lia Foyer TATTOO INJECTION  10/07/2019   Procedure: SUBMUCOSAL TATTOO INJECTION;  Surgeon: Lavena Bullion, DO;  [facility];  Service: Gastroenterology;;    Current Meds  Medication Sig  . aspirin 81 MG chewable tablet Chew 1 tablet (81 mg total) by mouth daily.  . citalopram (CELEXA) 10 MG tablet Take 0.5 tablets (5 mg total) by mouth daily.  . clonazePAM (KLONOPIN) 1 MG disintegrating tablet Take 1 tablet (1 mg total) by mouth 2 (two) times daily as needed.  . clopidogrel (PLAVIX) 75 MG tablet Take 1 tablet (75 mg total) by mouth daily.  Marland Kitchen ezetimibe (ZETIA) 10 MG tablet Take 10 mg by mouth at bedtime.  . fluticasone (FLONASE) 50 MCG/ACT nasal spray Place 1-2 sprays into both nostrils as needed for allergies or rhinitis (or seasonal allergies).   . gabapentin (NEURONTIN) 100 MG capsule Take 1 capsule (100 mg total) by mouth at bedtime.  Marland Kitchen glimepiride (AMARYL) 2 MG tablet Take 1 tablet (2 mg total) by mouth daily with breakfast.  . glucose blood test strip Monitor BS before meals and at bedtime for a couple of weeks. Then may decrease to bid before meals  . Lancets (ONETOUCH ULTRASOFT) lancets Use as instructed  . lisinopril (ZESTRIL) 2.5 MG tablet Take 1 tablet (2.5 mg total) by mouth daily.  . metoprolol succinate (TOPROL XL) 100 MG 24 hr tablet Take 1 tablet (100 mg total) by mouth in the morning and at bedtime.  . nitroGLYCERIN (NITROSTAT) 0.4 MG SL tablet Place 0.4 mg under the tongue every 5 (five) minutes as needed for chest pain.   . rosuvastatin (CRESTOR) 20 MG tablet Take 1 tablet (20 mg  total) by mouth daily.  . sertraline (ZOLOFT) 50 MG tablet Take 50 mg by mouth daily.  . traZODone (DESYREL) 50 MG tablet Take 1 tablet (50 mg total) by mouth at bedtime as needed for sleep.    Allergies: Ibuprofen, Metformin and related, Naproxen, Penicillins, and Yellow jacket venom [bee venom]  Social History   Tobacco Use  . Smoking status: Never Smoker  . Smokeless tobacco: Former Systems developer    Types: Secondary school teacher  . Vaping Use: Never used  Substance Use Topics  . Alcohol use: Not Currently    Comment: occasional  . Drug use: Not Currently    Types: Marijuana    Family History  Problem Relation Age of Onset  . Diverticulitis Mother   . Hypertension Father   . Heart disease Father        MI  . Prostate cancer Father   . Colon cancer Neg Hx   . Stomach cancer Neg Hx   . Pancreatic cancer Neg Hx   . Rectal cancer Neg Hx     Review of Systems: A 12-system review of systems was performed and was negative except as noted in the HPI.  --------------------------------------------------------------------------------------------------  Physical Exam: BP 126/84 (BP Location: Left Arm, Patient Position: Sitting, Cuff Size: Normal)   Pulse (!) 129   Ht 5\' 10"  (1.778 m)   Wt 211 lb 2 oz (95.8 kg)   SpO2 98%   BMI 30.29 kg/m   General: NAD.  Mild pallor noted. Neck: No JVD or HJR. Lungs: Clear to auscultation without wheezes or crackles. Heart: Tachycardic and irregularly irregular without murmurs. Abdomen: Soft, nontender, nondistended. Extremities: No lower extremity edema.  EKG: Atypical atrial flutter with PVC and inferior Q waves.  Lab Results  Component Value Date   WBC 9.3 11/17/2019   HGB 9.3 (L) 11/17/2019   HCT 27.7 (L) 11/17/2019   MCV 86.0 11/17/2019   PLT 252 11/17/2019    Lab Results  Component Value Date   NA 141 11/17/2019   K 3.5 11/17/2019   CL 106 11/17/2019   CO2 27 11/17/2019   BUN 17 11/17/2019   CREATININE 1.14 11/17/2019   GLUCOSE  94 11/17/2019   ALT 10 11/17/2019    Lab Results  Component Value Date   CHOL 148 10/09/2019   HDL 26 (L) 10/09/2019   LDLCALC 98 10/09/2019   TRIG 119 10/09/2019   CHOLHDL 5.7 10/09/2019    --------------------------------------------------------------------------------------------------  ASSESSMENT AND PLAN: Atrial fibrillation/flutter: EKG today is most consistent with atrial flutter with ventricular rate near 130 bpm.  Mr. Markovitz is asymptomatic.  Recent echocardiogram during hospitalization in June was notable for normal LVEF.  In light of this, we will add diltiazem 180 mg daily to current regimen of metoprolol succinate 100 mg twice daily.  Given rectal mass with oozing noted on endoscopy and intermittent hematochezia as well as declining hemoglobin, anticoagulation remains contraindicated despite elevated risk for recurrent stroke.  Inability to anticoagulate also precludes a rhythm control strategy at this time.  Hopefully, will be able to achieve better rate control.  Once his rectal mass has been treated and hematochezia has resolved, anticoagulation will need to be readdressed.  I do not believe that Mr. Michele would be a good candidate for left atrial appendage occlusion given his newly diagnosed advanced rectal cancer with bleeding that may make antiplatelet therapy difficult as well.  Coronary artery disease: Mr. Savage has not had any further angina.  He is now greater than 6 months out from his presenting inferior STEMI in 03/2019 and 5 months from his subsequent PCI to the mid LAD in the setting of sustained ventricular tachycardia.  That we would ideally like to complete 12 months of dual antiplatelet therapy, I am concerned about his continued hematochezia and declining hemoglobin.  We have therefore agreed to discontinue aspirin and continue clopidogrel 75 mg daily.  If bleeding remains an issue, we may need to consider switching to low-dose aspirin alone.  We will continue  statin therapy for secondary prevention.  Ischemic cardiomyopathy: Mr. Newberry appears euvolemic on examination today and denies symptoms of decompensated heart failure.  Recent echocardiogram showed normalization of LVEF.  In light of his soft blood pressure and addition of diltiazem for rate control, I will discontinue lisinopril.  We will continue metoprolol succinate 100 mg twice daily.  Stroke: Likely precipitated by atrial fibrillation.  Fortunately, Mr. Rivero does not have any residual neurologic deficits.  He remains at significantly elevated risk for recurrent stroke given his continued atrial fibrillation/flutter.  Unfortunately, he remains unable to be adequately anticoagulated due to his rectal mass with bleeding.  As above, we will discontinue aspirin and continue clopidogrel for secondary  prevention.  Continue statin therapy and follow-up with neurology as well.  Rectal cancer: Ongoing management per oncology and GI.  Follow-up: Return to clinic in 1 week to reassess rate control.  Nelva Bush, MD 11/18/2019 4:12 PM

## 2019-11-18 NOTE — Therapy (Signed)
[place] MAIN [facility] SERVICES [address] [place], [place], [address] Phone: [phone]   Fax:  [phone]  Physical Therapy Treatment  Patient Details  Name: John Douglas MRN: [id number] Date of Birth: 07/16/1955 Referring Provider (PT): Reesa Chew    Encounter Date: 11/18/2019   PT End of Session - 11/18/19 1300    Visit Number 2    Number of Visits 16    Date for PT Re-Evaluation 01/07/20    Authorization Type 1/10 eval 11/12/19    Authorization Time Period FOTO performed    PT Start Time 1300    PT Stop Time 0140    PT Time Calculation (min) 760 min    Equipment Utilized During Treatment Gait belt    Activity Tolerance Patient tolerated treatment well    Behavior During Therapy [facility] for tasks assessed/performed;Impulsive           Past Medical History:  Diagnosis Date  . Allergic rhinitis    [facility]  . Anxiety   . Atrial fibrillation ([place])    [facility]  . Benign essential hypertension    [facility]  . Coronary artery disease    [facility]  . Diabetic neuropathy, type II diabetes mellitus ([place])    [facility]  . Iron deficiency anemia due to chronic blood loss 11/18/2019  . Mixed hyperlipidemia    [facility]  . Myocardial infarct [facility])    [facility]  . Obesity, unspecified    [facility]  . Rectal bleeding    [place] [facility]  . 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 ([place]) 11/17/2019  . Wide-complex tachycardia ([place]) 06/04/2019    Past Surgical History:  Procedure Laterality Date  . BIOPSY  10/07/2019   Procedure: BIOPSY;  Surgeon: Lavena Bullion, DO;  Location: [facility];  Service: Gastroenterology;;  . COLONOSCOPY WITH PROPOFOL N/A 10/07/2019   Procedure: COLONOSCOPY WITH PROPOFOL;  Surgeon: Lavena Bullion,  DO;  Location: [facility];  Service: Gastroenterology;  Laterality: N/A;  . CORONARY ANGIOPLASTY     3 stents  . LEFT HEART CATH AND CORONARY ANGIOGRAPHY N/A 06/05/2019   Procedure: LEFT HEART CATH AND CORONARY ANGIOGRAPHY;  Surgeon: Nelva Bush, MD;  Location: [facility];  Service: Cardiovascular;  Laterality: N/A;  . POLYPECTOMY  10/07/2019   Procedure: POLYPECTOMY;  Surgeon: Lavena Bullion, DO;  Location: [facility];  Service: Gastroenterology;;  . Lia Foyer TATTOO INJECTION  10/07/2019   Procedure: SUBMUCOSAL TATTOO INJECTION;  Surgeon: Lavena Bullion, DO;  Location: [facility];  Service: Gastroenterology;;    There were no vitals filed for this visit.   Subjective Assessment - 11/18/19 1304    Subjective Patient reported he has tried some exercises since evaluation, but did not feel well over the weekend.    Patient is accompained by: Family member    Pertinent History Pt is a 64 y.o. male with PMH of wide-complex tachycardia, hyperlipidemia, HTN, rectal bleeding, myocardial infarct, DM, CAD, and a fib w/ no anticoagulation. Per daughter, 10/06/2019 PM pt became dizzy and disoriented which caused him to fall. He was taking medications for colon prep for colonoscopy to have on 10/07/2019. EMS was called and bought to ED - colonoscopy was performed later that day. Pt was brought to [facility] found to have A. fib with RVR and a heart rate of 150.  CT  of head was obtained showing that he did have an infarct in the R PCA distribution. After hospital he went to inpatient rehab for 6 days. His daughter now stays will him until he is "better". . Patient went to the ED on 11/06/19 for paroxysmal a fib. Per patient and daughter is unable to have tx for cardiac issues until cancer is addressed.  Has a membership at a pool and goes there and does exercises.    How long can you stand comfortably? hasn't tried yet.    How long can you walk comfortably? uses a walker for balance,  limited    Patient Stated Goals walk without walker, walk normal, better balance.    Currently in Pain? No/denies           Exercises Octane x5 mins level 2, cueing for tehcnique and >40 SPM Seated Long Arc Quad 2x10 with 5 second holds on LLE Heel rises with counter support 2x10 5 second holds at the top  Seated Ankle Alphabet 1 round, capital letters on LLE Seated march with taps coordinated in front of him to 6 in step with cues for items/names/colors x2 minutes, cues to attend to coordination Ambulation with RW ~184ft with supervision Ambulation without RW ~141ft x 2 with CGA Ambulation without RW head turns vertical/horizontal x4 lengths (~467ft) with CGA retroambulation x2 lengths (~239ft with CGA-minA) Sit to stands, coordinating toe taps in front of him while counting backwards from 100, CGA/minA no UE support once in standing, use of BUE to come into standing Side stepping ~34ft twice, cueing for step length, form, pacing   On foam: Feet apart x30secs without UE support Feet together x30secs without UE support Feet apart eyes closed 2x30secs without UE support   Pt response/clinical impression: Pt challenged with dual task interventions today, and intermittently needed cueing to return to task. Pt needed CGA with more dynamic tasks as well and on uneven surface for safety. HR in 131 at max today with interventions. The patient would benefit from further skilled PT intervention to continue to progress towards goals.     PT Education - 11/18/19 1259    Education Details therex    [employer]) Educated Patient;Child(ren)    Methods Explanation;Demonstration;Tactile cues;Verbal cues    Comprehension Verbalized understanding;Returned demonstration;Verbal cues required;Tactile cues required;Need further instruction            PT Short Term Goals - 11/12/19 1245      PT SHORT TERM GOAL #1   Title Patient will be independent in home exercise program to improve  strength/mobility for better functional independence with ADLs.    Baseline 7/15: HEP given    Time 4    Period Weeks    Status New    Target Date 12/10/19             PT Long Term Goals - 11/12/19 1245      PT LONG TERM GOAL #1   Title Patient will increase FOTO score to equal to or greater than     to demonstrate statistically significant improvement in mobility and quality of life.    Baseline 7/15: FOTO to do next session    Time 8    Period Weeks    Status New    Target Date 01/07/20      PT LONG TERM GOAL #2   Title Patient will demonstrate an improved Berg Balance Score of >46/56  as to demonstrate improved balance with ADLs such as sitting/standing and transfer balance  and reduced fall risk.    Baseline 7/15: 40/56    Time 8    Period Weeks    Status New    Target Date 01/07/20      PT LONG TERM GOAL #3   Title Patient will increase ABC scale score >80% to demonstrate better functional mobility and better confidence with ADLs.    Baseline 7/15: 64.3%    Time 8    Period Weeks    Status New    Target Date 01/07/20      PT LONG TERM GOAL #4   Title Patient will increase BLE gross strength to 4+/5 as to improve functional strength for independent gait, increased standing tolerance and increased ADL ability.    Baseline 7/15: see note    Time 8    Period Weeks    Status New    Target Date 01/07/20                 Plan - 11/18/19 1259    Clinical Impression Statement Pt challenged with dual task interventions today, and intermittently needed cueing to return to task. Pt needed CGA with more dynamic tasks as well and on uneven surface for safety. HR in 131 at max today with interventions. The patient would benefit from further skilled PT intervention to continue to progress towards goals    Personal Factors and Comorbidities Comorbidity 3+;Time since onset of injury/illness/exacerbation;Transportation;Past/Current Experience    Comorbidities GAD, DM,  leukocytosis, colon cancer, CAD, HTN, NSTMI, ventricular tachycardia    Examination-Activity Limitations Caring for [employer];Locomotion Level;Stand;Toileting;Transfers    Examination-Participation Restrictions Church;Cleaning;Community Activity;Driving;Laundry;Volunteer;Shop;Meal Prep;Yard Work    Merchant navy officer Evolving/Moderate complexity    Rehab Potential Fair    PT Frequency 2x / week    PT Duration 8 weeks    PT Treatment/Interventions ADLs/Self Care Home Management;Aquatic Therapy;Biofeedback;Canalith Repostioning;Cryotherapy;Moist Heat;Traction;DME Instruction;Gait training;Stair training;Functional mobility training;Therapeutic activities;Patient/family education;Neuromuscular re-education;Balance training;Therapeutic exercise;Manual techniques;Energy conservation;Dry needling;Passive range of motion;Taping;Vasopneumatic Device;Vestibular    PT Next Visit Plan FOTO; balance on airex pad    PT Home Exercise Plan see above    Consulted and Agree with Plan of Care Patient;Family member/caregiver    Family Member Consulted daughter           Patient will benefit from skilled therapeutic intervention in order to improve the following deficits and impairments:  Abnormal gait, Cardiopulmonary status limiting activity, Decreased activity tolerance, Decreased balance, Decreased knowledge of precautions, Decreased endurance, Decreased coordination, Decreased mobility, Difficulty walking, Decreased strength, Impaired UE functional use, Postural dysfunction, Improper body mechanics  Visit Diagnosis: Muscle weakness (generalized)  Other lack of coordination  Unsteadiness on feet  Other abnormalities of gait and mobility  Acute ischemic right PCA stroke [facility])     Problem List Patient Active Problem List   Diagnosis Date Noted  . Iron deficiency anemia due to chronic blood loss 11/18/2019  . Rectal cancer metastasized to  intrapelvic lymph node ([place]) 11/17/2019  . Generalized anxiety disorder 10/26/2019  . Diabetes mellitus ([place]) 10/26/2019  . Acute ischemic right PCA stroke ([place]) 10/15/2019  . Ischemic stroke ([place]) 10/08/2019  . Hypokalemia   . Change in bowel habits   . Rectal bleeding   . Polyp of descending colon   . Diverticulosis of colon without hemorrhage   . Gastrointestinal hemorrhage 08/29/2019  . Coronary artery disease 08/29/2019  . Ischemic cardiomyopathy 08/29/2019  . Paroxysmal atrial fibrillation ([facility])   . Essential hypertension   . Hyperlipidemia LDL goal <70   .  Non-ST elevation (NSTEMI) myocardial infarction ([place])   . Ventricular tachycardia ([place]) 06/04/2019    Lieutenant Diego PT, DPT 3:25 PM,11/18/19   [place] MAIN [facility] SERVICES [address] [place], [place], [address] Phone: [phone]   Fax:  [phone]  Name: John Douglas MRN: [id number] Date of Birth: [birth year]

## 2019-11-18 NOTE — Patient Instructions (Signed)
Medication Instructions:  Your physician has recommended you make the following change in your medication:   1) STOP Aspirin 2) STOP Lisinopril 3) START Diltiazem 180 mg daily. An Rx has been sent to your pharmacy.  *If you need a refill on your cardiac medications before your next appointment, please call your pharmacy*   Lab Work: None ordered If you have labs (blood work) drawn today and your tests are completely normal, you will receive your results only by: Marland Kitchen MyChart Message (if you have MyChart) OR . A paper copy in the mail If you have any lab test that is abnormal or we need to change your treatment, we will call you to review the results.   Testing/Procedures: None ordered   Follow-Up: At [facility], you and your health needs are our priority.  As part of our continuing mission to provide you with exceptional heart care, we have created designated Provider Care Teams.  These Care Teams include your primary Cardiologist (physician) and Advanced Practice Providers (APPs -  Physician Assistants and Nurse Practitioners) who all work together to provide you with the care you need, when you need it.  We recommend signing up for the patient portal called "MyChart".  Sign up information is provided on this After Visit Summary.  MyChart is used to connect with patients for Virtual Visits (Telemedicine).  Patients are able to view lab/test results, encounter notes, upcoming appointments, etc.  Non-urgent messages can be sent to your provider as well.   To learn more about what you can do with MyChart, go to [email or web address].    Your next appointment:   11/26/19   The format for your next appointment:   In Person  Provider:    You may see one of the following Advanced Practice Providers on your designated Care Team:    Murray Hodgkins, NP  Christell Faith, PA-C  Marrianne Mood, PA-C    Other Instructions N/A

## 2019-11-18 NOTE — Progress Notes (Signed)
Hematology and Oncology Follow Up Visit  John Douglas [id number] [birth year] 64 y.o. 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 daily  Aspirin 81 mg p.o. daily     Interim History:  John Douglas is in for his first office visit.  I have not seen him in the hospital.  He presented to [facility] in June.  He had a CVA.  He was in atrial fibrillation.  He was having rectal bleeding.  He never has had a colonoscopy.  He is found to have a rectal mass.  This was biopsied and found to be adeno carcinoma.  Work-up showed that there was distant adenopathy that was present.  There was no hepatic involvement.  There is no pulmonary involvement or bone involvement.  As such, he was a stage IV rectal adenocarcinoma.  After inpatient stay, he was then sent to rehab.  He improved with rehab.  He has no weakness over on the left side.  This seems to be improving.  He comes in with some of his family.  They just got back from the beach.  They will be go back to the beach in about a couple weeks.  He still is dealing with the atrial fibrillation.  His heart rate is still on the high side.  The rectal bleeding seems to be improving.  I know he has some iron in the hospital.  His CEA was 15.8 back in June.  He is eating well.  He is having no problems with nausea or vomiting.  There really has not been problems with pain.  He has had no diarrhea.  He has had no leg swelling.  There is been no headache.  Overall, his performance status is ECOG 1.  Medications:  Current Outpatient Medications:  .  acetaminophen (TYLENOL) 325 MG tablet, Take 1-2 tablets (325-650 mg total) by mouth every 4 (four) hours as needed for mild pain. (Patient not taking: Reported on 11/16/2019), Disp: , Rfl:  .  aspirin 81 MG chewable tablet, Chew 1  tablet (81 mg total) by mouth daily., Disp:  , Rfl:  .  citalopram (CELEXA) 10 MG tablet, Take 0.5 tablets (5 mg total) by mouth daily., Disp: 30 tablet, Rfl: 0 .  clonazePAM (KLONOPIN) 1 MG disintegrating tablet, Take 1 tablet (1 mg total) by mouth 2 (two) times daily as needed. (Patient not taking: Reported on 11/16/2019), Disp: 30 tablet, Rfl: 0 .  clopidogrel (PLAVIX) 75 MG tablet, Take 1 tablet (75 mg total) by mouth daily., Disp: 30 tablet, Rfl: 0 .  ezetimibe (ZETIA) 10 MG tablet, Take 10 mg by mouth at bedtime., Disp: , Rfl:  .  fluticasone (FLONASE) 50 MCG/ACT nasal spray, Place 1-2 sprays into both nostrils as needed for allergies or rhinitis (or seasonal allergies). , Disp: , Rfl:  .  gabapentin (NEURONTIN) 100 MG capsule, Take 1 capsule (100 mg total) by mouth at bedtime., Disp: 30 capsule, Rfl: 0 .  glimepiride (AMARYL) 2 MG tablet, Take 1 tablet (2 mg total) by mouth daily with breakfast., Disp: 30 tablet, Rfl: 0 .  glucose blood test strip, Monitor BS before meals and at bedtime for a couple of weeks. Then may decrease to bid before meals, Disp: 100 each, Rfl: 12 .  Lancets (ONETOUCH ULTRASOFT) lancets, Use as instructed, Disp: 100 each, Rfl: 12 .  lisinopril (ZESTRIL)  2.5 MG tablet, Take 1 tablet (2.5 mg total) by mouth daily., Disp: 30 tablet, Rfl: 0 .  metoprolol succinate (TOPROL XL) 100 MG 24 hr tablet, Take 1 tablet (100 mg total) by mouth in the morning and at bedtime., Disp: 60 tablet, Rfl: 3 .  nitroGLYCERIN (NITROSTAT) 0.4 MG SL tablet, Place 0.4 mg under the tongue every 5 (five) minutes as needed for chest pain. , Disp: , Rfl:  .  rosuvastatin (CRESTOR) 20 MG tablet, Take 1 tablet (20 mg total) by mouth daily., Disp: 30 tablet, Rfl: 0 .  sertraline (ZOLOFT) 50 MG tablet, Take 50 mg by mouth daily., Disp: , Rfl:  .  traZODone (DESYREL) 50 MG tablet, Take 1 tablet (50 mg total) by mouth at bedtime as needed for sleep., Disp: 30 tablet, Rfl: 0  Allergies:  Allergies    Allergen Reactions  . Ibuprofen Other (See Comments)    Was told to not take this   . Metformin And Related     Bloody stools   . Naproxen Other (See Comments)    Was told to not take this   . Penicillins Hives    Did it involve swelling of the face/tongue/throat, SOB, or low BP? Unk Did it involve sudden or severe rash/hives, skin peeling, or any reaction on the inside of your mouth or nose? Yes Did you need to seek medical attention at a hospital or doctor's office? Unk When did it last happen?"Childhood- 55 years ago" If all above answers are "NO", may proceed with cephalosporin use.   . [place] Venom [Bee Venom] Swelling and Other (See Comments)    Severe swelling where stung    Past Medical History, Surgical history, Social history, and Family History were reviewed and updated.  Review of Systems: Review of Systems  Constitutional: Negative.   HENT:  Negative.   Eyes: Negative.   Respiratory: Negative.   Cardiovascular: Positive for palpitations.  Gastrointestinal: Positive for blood in stool.  Endocrine: Negative.   Genitourinary: Negative.    Skin: Negative.   Neurological: Positive for extremity weakness.  Hematological: Negative.   Psychiatric/Behavioral: Negative.     Physical Exam:  weight is 208 lb (94.3 kg). His oral temperature is 98.2 F (36.8 C). His blood pressure is 115/79 and his pulse is 130 (abnormal). His respiration is 18 and oxygen saturation is 99%.   Wt Readings from Last 3 Encounters:  11/17/19 208 lb (94.3 kg)  11/10/19 209 lb (94.8 kg)  11/09/19 208 lb 9.6 oz (94.6 kg)    Physical Exam Vitals reviewed.  HENT:     Head: Normocephalic and atraumatic.  Eyes:     Pupils: Pupils are equal, round, and reactive to light.  Cardiovascular:     Rate and Rhythm: Normal rate and regular rhythm.     Heart sounds: Normal heart sounds.     Comments: Cardiac exam shows a tachycardic rate.  The rate is somewhat regular.  There may be  occasional extra beat.  There are no murmurs, rubs or bruits. Pulmonary:     Effort: Pulmonary effort is normal.     Breath sounds: Normal breath sounds.  Abdominal:     General: Bowel sounds are normal.     Palpations: Abdomen is soft.     Comments: Abdominal exam is soft.  He has decent bowel sounds.  There is no guarding or rebound tenderness.  There is no fluid wave.  There is no palpable liver or spleen tip.  Musculoskeletal:  General: No tenderness or deformity. Normal range of motion.     Cervical back: Normal range of motion.  Lymphadenopathy:     Cervical: No cervical adenopathy.  Skin:    General: Skin is warm and dry.     Findings: No erythema or rash.  Neurological:     Mental Status: He is alert and oriented to person, place, and time.  Psychiatric:        Behavior: Behavior normal.        Thought Content: Thought content normal.        Judgment: Judgment normal.      Lab Results  Component Value Date   WBC 9.3 11/17/2019   HGB 9.3 (L) 11/17/2019   HCT 27.7 (L) 11/17/2019   MCV 86.0 11/17/2019   PLT 252 11/17/2019     Chemistry      Component Value Date/Time   NA 141 11/17/2019 1347   NA 140 08/28/2019 0940   K 3.5 11/17/2019 1347   CL 106 11/17/2019 1347   CO2 27 11/17/2019 1347   BUN 17 11/17/2019 1347   BUN 21 08/28/2019 0940   CREATININE 1.14 11/17/2019 1347      Component Value Date/Time   CALCIUM 9.3 11/17/2019 1347   ALKPHOS 41 11/17/2019 1347   AST 10 (L) 11/17/2019 1347   ALT 10 11/17/2019 1347   BILITOT 0.9 11/17/2019 1347      Impression and Plan: Mr. Waddell is a very nice 64 year old white male.  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  He had a rectal bleeding.  He subsequently was found to have rectal cancer.  He had adenopathy that was distant to the rectum.  We will go ahead with systemic therapy.  He will probably do okay with FOLFOX.  I would hold off on using Avastin given the CVA right  now.  I spent about an hour or so with he and his family.  I explained why chemotherapy would be reasonable.  I believe that he should respond well to chemotherapy given the fact that he does not have hepatic involvement.  He will need to have a Port-A-Cath placed.  Unfortunately, he cannot come off the Plavix for the Port-A-Cath.  He is at significant risk for another CVA if he is off Plavix.  If we can get good tumor shrinkage, cardiology will put him on Eliquis for the anticoagulation.  We do need to give him some iron.  He is anemic.  We will try to get started next week.  I would like to try to get his first cycle of treatment and before he and his family go back to the beach.  I would give him 4 cycles of treatment and then we will follow up with his CT scans.  We will see what his CEA level is.  We should also see this improve.  It is always fun to see Mr. Justen.  He has a very good attitude.  He has a wonderful family and support from them.  His girlfriend is a Marine scientist and she certainly has a lot of knowledge which will help [place].  We will try to get him started next week.  I will then see him back 2 weeks after that for his second cycle of treatment.   Volanda Napoleon, MD 7/21/20217:11 AM

## 2019-11-19 ENCOUNTER — Encounter: Payer: Self-pay | Admitting: Internal Medicine

## 2019-11-19 NOTE — Progress Notes (Signed)
Cardiology Office Note    Date:  11/27/2019   ID:  John Douglas, DOB 1955/05/05, MRN [id number]  PCP:  Leonides Sake, MD  Cardiologist:  Nelva Bush, MD  Electrophysiologist:  None   Chief Complaint: Follow-up  History of Present Illness:   John Douglas is a 64 y.o. male with history of CAD with inferior STEMI status post primary PCI to the RCA in 10/3708 complicated by sustained VT in the setting of residual left coronary artery disease status post subsequent PCI to the mid LAD in 09/2692, PAF complicated by CVA in 11/5460, atrial flutter, HFrEF secondary to ICM, DM2, recurrent GI bleeding with recent diagnosis of rectal cancer, HTN, and HLD who presents for follow-up of A. fib/flutter with RVR.  He was previously followed by [facility].  In 03/2019, he presented to [facility] with an inferior STEMI and on emergent cath was found to have an occluded RCA with residual LCx and LAD disease.  He received PCI x3 to the RCA with medical management of residual disease.  Echo at that time showed an EF of 45%, akinesis of the inferior wall, mildly dilated left atrium, mildly reduced RV systolic function.  He was started on Brilinta given that he was already treated with Eliquis for A. fib.  He was seen by GI in 06/2019 with hematochezia and was noted to be hypotensive and tachycardic.  He was transferred to [facility] via EMS for an irregular heart rhythm.  In route to the ED he was noted to be in a wide-complex tachycardic rhythm, possibly VT, with heart rates in the 180s.  He was started on amiodarone infusion.  He subsequently underwent LHC on 06/05/2019 which showed significant single vessel CAD with diffuse mid LAD stenosis up to 80 to 90% extending into the ostium of D1.  There was mild to moderate nonobstructive CAD otherwise in the remaining LAD, LCx, and RCA territories.  There were widely patent overlapping stents within the RCA extending from the ostium through the distal vessel.  He underwent successful  PCI/DES to the mid LAD.  The D2 branch was jailed and exhibited 90% ostial stenosis though had TIMI-3 flow.  At this time, apixaban was held given the further risk of GI bleed.  Echo during that admission showed an EF of 45 to 50%, inferior basal hypokinesis, normal RV systolic function and RV cavity size, trivial mitral regurgitation, and mild to moderate aortic valve sclerosis without stenosis.  He was recently diagnosed with rectal cancer in the setting of recurrent hematochezia in 09/2019.  He was admitted to the hospital in early 09/2019 with right PCA territory infarct.  Neurology recommended deferring apixaban until after work-up/treatment of his bleeding rectal cancer has been completed.  Echo during that admission showed a normal LVEF at 60 to 65%, no regional wall motion abnormalities, grade 1 diastolic dysfunction, normal RV systolic function and RV cavity size, mildly dilated left atrium, mild to moderate aortic valve sclerosis without stenosis, mildly dilated aorta measuring 38 mm, and negative saline study.  He was seen in the ED in early 10/2019 with tachycardic heart rates in his Apple Watch noting he was back in A. fib.  He was evaluated by the A. fib clinic on 7/12 at which time he was still in A. fib.  He was most recently seen in the office on 10/2019 and was doing relatively well.  He continued to note tachycardic heart rates by his apple watch with rates in the 120s to 140s bpm  noting only rare episodes in which his heart rate was less than 100 bpm.  He was asymptomatic with these tachycardic heart rates.  He noted some intermittent diarrhea with a small amount of blood mixed with his stool.  He was tolerating Toprol-XL and remained on DAPT with aspirin and clopidogrel given recent PCI.  EKG at his visit on 10/2019 was most consistent with atrial flutter with RVR with rate near 130 bpm.  He was asymptomatic.  Cardizem CD 180 mg daily was added to his Toprol-XL 100 mg twice daily.  With his ongoing  hematochezia and declining hemoglobin anticoagulation remained contraindicated despite elevated risk for recurrent stroke.  He comes in today to follow-up on his atrial flutter with RVR.  He comes in accompanied by his daughter today and since he was last seen, with the addition of diltiazem, he has noted significant improvement in his heart rates.  Heart rates at home are typically in the 70s to 80s bpm with a rare reading in the 120s bpm.  Tolerating both diltiazem and metoprolol.  No chest pain, palpitations, dyspnea, dizziness, presyncope, syncope, lower extremity swelling, abdominal distention, orthopnea.  No falls since he was last seen.  Hematochezia has been relatively stable.  He did note some BRBPR following a [place] meal since he was last seen.  BP stable.  No issues or concerns at this time.   Labs independently reviewed: 10/2019 - Hgb 9.3, PLT 252, potassium 3.5, BUN 17, serum creatinine 1.14, albumin 4.1, AST/ALT normal 06/2019 - TSH normal, magnesium 2.1, A1c 5.4, TC 71, TG 99, HDL 21, LDL 30  Past Medical History:  Diagnosis Date  . Allergic rhinitis    [facility]  . Anxiety   . Atrial fibrillation ([place])    [facility]  . Benign essential hypertension    [facility]  . Coronary artery disease    [facility]  . Diabetic neuropathy, type II diabetes mellitus ([place])    [facility]  . Iron deficiency anemia due to chronic blood loss 11/18/2019  . Mixed hyperlipidemia    [facility]  . Myocardial infarct [facility])    [facility]  . Obesity, unspecified    [facility]  . Rectal bleeding     [facility]  . 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 ([place]) 11/17/2019  . Wide-complex tachycardia ([place]) 06/04/2019    Past Surgical History:  Procedure Laterality Date  . BIOPSY  10/07/2019    Procedure: BIOPSY;  Surgeon: Lavena Bullion, DO;  Location: [facility];  Service: Gastroenterology;;  . COLONOSCOPY WITH PROPOFOL N/A 10/07/2019   Procedure: COLONOSCOPY WITH PROPOFOL;  Surgeon: Lavena Bullion, DO;  Location: [facility];  Service: Gastroenterology;  Laterality: N/A;  . CORONARY ANGIOPLASTY     3 stents  . IR IMAGING GUIDED PORT INSERTION  11/24/2019  . LEFT HEART CATH AND CORONARY ANGIOGRAPHY N/A 06/05/2019   Procedure: LEFT HEART CATH AND CORONARY ANGIOGRAPHY;  Surgeon: Nelva Bush, MD;  Location: [place] [facility];  Service: Cardiovascular;  Laterality: N/A;  . POLYPECTOMY  10/07/2019   Procedure: POLYPECTOMY;  Surgeon: Lavena Bullion, DO;  Location: [facility];  Service: Gastroenterology;;  . Lia Foyer TATTOO INJECTION  10/07/2019   Procedure: SUBMUCOSAL TATTOO INJECTION;  Surgeon: Lavena Bullion, DO;  Location: [facility];  Service: Gastroenterology;;    Current Medications: Current Meds  Medication Sig  . acetaminophen (TYLENOL)  325 MG tablet Take 1-2 tablets (325-650 mg total) by mouth every 4 (four) hours as needed for mild pain.  . clonazePAM (KLONOPIN) 1 MG disintegrating tablet Take 1 tablet (1 mg total) by mouth 2 (two) times daily as needed.  . clopidogrel (PLAVIX) 75 MG tablet Take 1 tablet (75 mg total) by mouth daily.  Marland Kitchen diltiazem (CARDIZEM CD) 180 MG 24 hr capsule Take 1 capsule (180 mg total) by mouth daily.  Marland Kitchen ezetimibe (ZETIA) 10 MG tablet Take 10 mg by mouth at bedtime.  . fluticasone (FLONASE) 50 MCG/ACT nasal spray Place 1-2 sprays into both nostrils as needed for allergies or rhinitis (or seasonal allergies).   . gabapentin (NEURONTIN) 100 MG capsule TAKE 1 CAPSULE BY MOUTH AT BEDTIME.  Marland Kitchen glimepiride (AMARYL) 2 MG tablet Take 1 tablet (2 mg total) by mouth daily with breakfast.  . glucose blood test strip Monitor BS before meals and at bedtime for a couple of weeks. Then may decrease to bid before meals  . Lancets (ONETOUCH  ULTRASOFT) lancets Use as instructed  . lidocaine-prilocaine (EMLA) cream Apply to affected area once  . LORazepam (ATIVAN) 0.5 MG tablet Take 1 tablet (0.5 mg total) by mouth every 6 (six) hours as needed (Nausea or vomiting).  . metoprolol succinate (TOPROL XL) 100 MG 24 hr tablet Take 1 tablet (100 mg total) by mouth in the morning and at bedtime.  . nitroGLYCERIN (NITROSTAT) 0.4 MG SL tablet Place 0.4 mg under the tongue every 5 (five) minutes as needed for chest pain.   Marland Kitchen ondansetron (ZOFRAN) 8 MG tablet Take 1 tablet (8 mg total) by mouth 2 (two) times daily as needed for refractory nausea / vomiting. Start on day 3 after chemotherapy.  . prochlorperazine (COMPAZINE) 10 MG tablet Take 1 tablet (10 mg total) by mouth every 6 (six) hours as needed (Nausea or vomiting).  . rosuvastatin (CRESTOR) 20 MG tablet Take 1 tablet (20 mg total) by mouth daily.  . sertraline (ZOLOFT) 50 MG tablet Take 50 mg by mouth daily.  . traZODone (DESYREL) 50 MG tablet TAKE 1 TABLET (50 MG TOTAL) BY MOUTH AT BEDTIME AS NEEDED FOR SLEEP.    Allergies:   Ibuprofen, Metformin and related, Naproxen, Penicillins, and Yellow jacket venom [bee venom]   Social History   Socioeconomic History  . Marital status: Divorced    Spouse name: Not on file  . Number of children: 2  . Years of education: Not on file  . Highest education level: Not on file  Occupational History  . Occupation: retired  Tobacco Use  . Smoking status: Never Smoker  . Smokeless tobacco: Former Systems developer    Types: Secondary school teacher  . Vaping Use: Never used  Substance and Sexual Activity  . Alcohol use: Not Currently    Comment: occasional  . Drug use: Not Currently    Types: Marijuana  . Sexual activity: Not Currently  Other Topics Concern  . 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 Difficulty of Paying Living Expenses:   Food Insecurity:   . Worried About Charity fundraiser in the Last Year:   . Arboriculturist in the Last Year:   Transportation Needs:   .  Lack of Transportation (Medical):   Marland Kitchen Lack of Transportation (Non-Medical):   Physical Activity:   . Days of Exercise per Week:   . Minutes of Exercise per Session:   Stress:   . Feeling of Stress :   Social Connections:   . Frequency of Communication with Friends and Family:   . Frequency of Social Gatherings with Friends and Family:   . Attends Religious Services:   . Active Member of Clubs or Organizations:   . Attends Archivist Meetings:   Marland Kitchen Marital Status:      Family History:  The patient's family history includes Diverticulitis in his mother; Heart disease in his father; Hypertension in his father; Prostate cancer in his father. There is no history of Colon cancer, Stomach cancer, Pancreatic cancer, or Rectal cancer.  ROS:   Review of Systems  Constitutional: Positive for malaise/fatigue. Negative for chills, diaphoresis, fever and weight loss.  HENT: Negative for congestion.   Eyes: Negative for discharge and redness.  Respiratory: Negative for cough, sputum production, shortness of breath and wheezing.   Cardiovascular: Negative for chest pain, palpitations, orthopnea, claudication, leg swelling and PND.  Gastrointestinal: Positive for blood in stool. Negative for abdominal pain, heartburn, melena, nausea and vomiting.  Musculoskeletal: Negative for falls and myalgias.  Skin: Negative for rash.  Neurological: Positive for weakness. Negative for dizziness, tingling, tremors, sensory change, speech change, focal weakness and loss of consciousness.  Endo/Heme/Allergies: Does not bruise/bleed easily.  Psychiatric/Behavioral: Negative for substance abuse. The patient is not nervous/anxious.   All other systems reviewed and are  negative.    EKGs/Labs/Other Studies Reviewed:    Studies reviewed were summarized above. The additional studies were reviewed today:  2D echo 06/2019: 1. Left ventricular ejection fraction, by visual estimation, is 45 to  50%. The left ventricle has mildly decreased function. There is no left  ventricular hypertrophy.  2. The left ventricle demonstrates regional wall motion abnormalities.  3. Inferior basal hypokinesis.  4. Global right ventricle has normal systolic function.The right  ventricular size is normal. No increase in right ventricular wall  thickness.  5. Left atrial size was normal.  6. Right atrial size was normal.  7. Mild mitral annular calcification.  8. The mitral valve is normal in structure. Trivial mitral valve  regurgitation. No evidence of mitral stenosis.  9. The tricuspid valve is normal in structure.  10. The tricuspid valve is normal in structure. Tricuspid valve  regurgitation is not demonstrated.  11. The aortic valve is normal in structure. Aortic valve regurgitation is  not visualized. Mild to moderate aortic valve sclerosis/calcification  without any evidence of aortic stenosis.  12. The pulmonic valve was normal in structure. Pulmonic valve  regurgitation is not visualized.  13. The inferior vena cava is normal in size with greater than 50%  respiratory variability, suggesting right atrial pressure of 3 mmHg.  __________  LHC 06/2019: Conclusions: 1. Significant single-vessel coronary artery disease with diffuse mid LAD disease of up to 80-90%, extending into the ostium of D1. 2. Mild to moderate, non-obstructive coronary artery disease involving the proximal LAD, LCx and RCA. 3. Widely patent overlapping stents in the RCA extending from the ostium through the distal vessel. 4. Successful PCI to the mid LAD using Synergy 2.5 x 38 mm drug-eluting stent (post-dilated to 2.9 mm) with 0% residual stenosis and TIMI-3 flow.  Jailed D2 branch  exhibits 90% ostial stenosis but TIMI-3 flow.  This branch was not intervened upon.  Recommendations:  1. Dual antiplatelet therapy with aspirin and ticagrelor for at least 3 months, at which point we could consider stopping aspirin given report of hematochezia. 2. I favor holding apixaban going further, as I worry that the risk of GI bleeding outweight the benefit at this time. 3. Aggressive secondary prevention. __________  Bubble study 09/2019: 1. Left ventricular ejection fraction, by estimation, is 60 to 65%. The  left ventricle has normal function. The left ventricle has no regional  wall motion abnormalities. Left ventricular diastolic parameters are  consistent with Grade I diastolic  dysfunction (impaired relaxation).  2. Right ventricular systolic function is normal. The right ventricular  size is normal.  3. Left atrial size was mildly dilated.  4. The mitral valve is normal in structure. No evidence of mitral valve  regurgitation. No evidence of mitral stenosis.  5. The aortic valve is tricuspid. Aortic valve regurgitation is not  visualized. Mild to moderate aortic valve sclerosis/calcification is  present, without any evidence of aortic stenosis.  6. Aortic dilatation noted. There is mild dilatation at the level of the  sinuses of Valsalva measuring 38 mm.  7. The inferior vena cava is normal in size with greater than 50%  respiratory variability, suggesting right atrial pressure of 3 mmHg.  8. Agitated saline contrast bubble study was negative, with no evidence  of any interatrial shunt.    EKG:  EKG is ordered today.  The EKG ordered today demonstrates A. fib, 88 bpm,, prior inferior infarct, nonspecific ST-T changes Recent Labs: 06/04/2019: TSH 0.729 10/08/2019: Magnesium 1.8 11/25/2019: ALT 13; BUN 24; Creatinine 1.03; Hemoglobin 10.7; Platelet Count 329; Potassium 3.5; Sodium 141  Recent Lipid Panel    Component Value Date/Time   CHOL 148 10/09/2019 0500    CHOL 78 (L) 08/28/2019 0940   TRIG 119 10/09/2019 0500   HDL 26 (L) 10/09/2019 0500   HDL 28 (L) 08/28/2019 0940   CHOLHDL 5.7 10/09/2019 0500   VLDL 24 10/09/2019 0500   LDLCALC 98 10/09/2019 0500   LDLCALC 32 08/28/2019 0940    PHYSICAL EXAM:    VS:  BP (!) 126/60 (BP Location: Right Arm, Patient Position: Sitting, Cuff Size: Normal)   Pulse 88   Ht 5\' 10"  (1.778 m)   Wt (!) 207 lb 8 oz (94.1 kg)   SpO2 99%   BMI 29.77 kg/m   BMI: Body mass index is 29.77 kg/m.  Physical Exam Constitutional:      Appearance: He is well-developed.  HENT:     Head: Normocephalic and atraumatic.  Eyes:     General:        Right eye: No discharge.        Left eye: No discharge.  Neck:     Vascular: No JVD.  Cardiovascular:     Rate and Rhythm: Normal rate. Rhythm irregularly irregular.     Pulses: No midsystolic click and no opening snap.          Posterior tibial pulses are 2+ on the right side and 2+ on the left side.     Heart sounds: Normal heart sounds, S1 normal and S2 normal. Heart sounds not distant. No murmur heard.  No friction rub.  Pulmonary:     Effort: Pulmonary effort is normal. No respiratory distress.     Breath sounds: Normal breath sounds. No decreased breath sounds, wheezing or rales.  Chest:     Chest wall: No tenderness.  Abdominal:     General: There is no distension.  Palpations: Abdomen is soft.     Tenderness: There is no abdominal tenderness.  Musculoskeletal:     Cervical back: Normal range of motion.  Skin:    General: Skin is warm and dry.     Nails: There is no clubbing.  Neurological:     Mental Status: He is alert and oriented to person, place, and time.  Psychiatric:        Speech: Speech normal.        Behavior: Behavior normal.        Thought Content: Thought content normal.        Judgment: Judgment normal.     Wt Readings from Last 3 Encounters:  11/27/19 (!) 207 lb 8 oz (94.1 kg)  11/24/19 (!) 209 lb (94.8 kg)  11/18/19 211 lb 2  oz (95.8 kg)     ASSESSMENT & PLAN:   1. CAD involving the native coronary arteries without angina: He is doing well without any symptoms concerning for angina.  He remains on Plavix monotherapy as aspirin was discontinued at his last visit on 7/21 in the setting of continued hematochezia with a declining hemoglobin.  Should bleeding remain an issue consider transitioning from Plavix to low-dose aspirin.  Continue Crestor and Zetia.  No plans for further ischemic evaluation at this time.  2. Persistent A. fib/flutter with RVR: He remains in A. fib with significantly improved ventricular response, now controlled.  Continue Cardizem CD along with Toprol XL bid.  Given a rectal mass with ongoing hematochezia intermittently and declining hemoglobin he remains off anticoagulation despite elevated risk for stroke.  In this setting, we will pursue rate control as outlined above.  Once his rectal mass has been treated and hematochezia has resolved, resumption of anticoagulation will need to be addressed.  Patient has not been felt to be a good candidate for left atrial appendage occlusion device given his newly diagnosed advanced rectal cancer with intermittent bleeding that may make antiplatelet therapy difficult as well.  3. HFrEF secondary to ICM: He appears euvolemic and well compensated.  Recent echo demonstrated normalization of LV systolic function as outlined above.  ACE inhibitor has been discontinued at his last visit to allow for added BP room for rate control.  Continue Toprol XL as outlined above.  4. History of VT: No further symptoms of recurrence.  Remains on Toprol-XL as above.  5. CVA: Likely precipitated by A. fib.  No residual neurologic deficits.  He remains at significant risk for recurrent stroke given continued A. fib/flutter and is unfortunately unable to be adequately anticoagulated as outlined above.  Resumption of anticoagulation will need to be considered following adequate  treatment of his rectal cancer and resolution of hematochezia.  He remains on Plavix and statin as outlined above.  6. HTN: Blood pressure well controlled.  Continue current medications as outlined above.  7. HLD: LDL 30 and 06/2019.  He remains on Crestor and Zetia.  8. GI bleed with rectal cancer: Ongoing management per oncology and GI.  Disposition: F/u with Dr. Saunders Revel or an APP in 3 months.   Medication Adjustments/Labs and Tests Ordered: Current medicines are reviewed at length with the patient today.  Concerns regarding medicines are outlined above. Medication changes, Labs and Tests ordered today are summarized above and listed in the Patient Instructions accessible in Encounters.   Signed, Christell Faith, PA-C 11/27/2019 11:07 AM     [facility] [address] [place],  [address] [phone]

## 2019-11-19 NOTE — Telephone Encounter (Signed)
Ok to fill Gabapentin and Trazodone?

## 2019-11-19 NOTE — Progress Notes (Signed)
Pharmacist Chemotherapy Monitoring - Initial Assessment    Anticipated start date: 11/25/19    Regimen:  . Are orders appropriate based on the patient's diagnosis, regimen, and cycle? Yes . Does the plan date match the patient's scheduled date? Yes . Is the sequencing of drugs appropriate? Yes . Are the premedications appropriate for the patient's regimen? Yes . Prior Authorization for treatment is: Approved o If applicable, is the correct biosimilar selected based on the patient's insurance? not applicable  Organ Function and Labs: Marland Kitchen Are dose adjustments needed based on the patient's renal function, hepatic function, or hematologic function? Yes . Are appropriate labs ordered prior to the start of patient's treatment? Yes . Other organ system assessment, if indicated: N/A . The following baseline labs, if indicated, have been ordered: N/A  Dose Assessment: . Are the drug doses appropriate? Yes . Are the following correct: o Drug concentrations Yes o IV fluid compatible with drug Yes o Administration routes Yes o Timing of therapy Yes . If applicable, does the patient have documented access for treatment and/or plans for port-a-cath placement? yes . If applicable, have lifetime cumulative doses been properly documented and assessed? not applicable Lifetime Dose Tracking  No doses have been documented on this patient for the following tracked chemicals: Doxorubicin, Epirubicin, Idarubicin, Daunorubicin, Mitoxantrone, Bleomycin, Oxaliplatin, Carboplatin, Liposomal Doxorubicin  o   Toxicity Monitoring/Prevention: . The patient has the following take home antiemetics prescribed: Ondansetron, Prochlorperazine, Dexamethasone and Lorazepam . The patient has the following take home medications prescribed: N/A . Medication allergies and previous infusion related reactions, if applicable, have been reviewed and addressed. Yes . The patient's current medication list has been assessed for  drug-drug interactions with their chemotherapy regimen. no significant drug-drug interactions were identified on review.  Order Review: . Are the treatment plan orders signed? Yes . Is the patient scheduled to see a provider prior to their treatment? No  I verify that I have reviewed each item in the above checklist and answered each question accordingly.  Malinda Mayden, Jacqlyn Larsen 11/19/2019 10:44 AM

## 2019-11-22 ENCOUNTER — Other Ambulatory Visit: Payer: Self-pay | Admitting: Radiology

## 2019-11-24 ENCOUNTER — Other Ambulatory Visit: Payer: Self-pay | Admitting: Hematology & Oncology

## 2019-11-24 ENCOUNTER — Ambulatory Visit: Payer: Medicare HMO

## 2019-11-24 ENCOUNTER — Ambulatory Visit (HOSPITAL_COMMUNITY)
Admission: RE | Admit: 2019-11-24 | Discharge: 2019-11-24 | Disposition: A | Discharge status: Home / Self Care | Payer: Medicare HMO | Source: Ambulatory Visit | Attending: Hematology & Oncology | Admitting: Hematology & Oncology

## 2019-11-24 ENCOUNTER — Other Ambulatory Visit: Payer: Self-pay

## 2019-11-24 ENCOUNTER — Encounter (HOSPITAL_COMMUNITY): Payer: Self-pay

## 2019-11-24 ENCOUNTER — Encounter: Payer: Medicare HMO | Admitting: Occupational Therapy

## 2019-11-24 DIAGNOSIS — D509 Iron deficiency anemia, unspecified: Secondary | ICD-10-CM | POA: Diagnosis not present

## 2019-11-24 DIAGNOSIS — Z8673 Personal history of transient ischemic attack (TIA), and cerebral infarction without residual deficits: Secondary | ICD-10-CM | POA: Diagnosis not present

## 2019-11-24 DIAGNOSIS — Z7984 Long term (current) use of oral hypoglycemic drugs: Secondary | ICD-10-CM | POA: Diagnosis not present

## 2019-11-24 DIAGNOSIS — I252 Old myocardial infarction: Secondary | ICD-10-CM | POA: Diagnosis not present

## 2019-11-24 DIAGNOSIS — F419 Anxiety disorder, unspecified: Secondary | ICD-10-CM | POA: Insufficient documentation

## 2019-11-24 DIAGNOSIS — C186 Malignant neoplasm of descending colon: Secondary | ICD-10-CM

## 2019-11-24 DIAGNOSIS — E669 Obesity, unspecified: Secondary | ICD-10-CM | POA: Diagnosis not present

## 2019-11-24 DIAGNOSIS — Z7902 Long term (current) use of antithrombotics/antiplatelets: Secondary | ICD-10-CM | POA: Insufficient documentation

## 2019-11-24 DIAGNOSIS — C775 Secondary and unspecified malignant neoplasm of intrapelvic lymph nodes: Secondary | ICD-10-CM | POA: Insufficient documentation

## 2019-11-24 DIAGNOSIS — E1140 Type 2 diabetes mellitus with diabetic neuropathy, unspecified: Secondary | ICD-10-CM | POA: Insufficient documentation

## 2019-11-24 DIAGNOSIS — Z79899 Other long term (current) drug therapy: Secondary | ICD-10-CM | POA: Insufficient documentation

## 2019-11-24 DIAGNOSIS — I4891 Unspecified atrial fibrillation: Secondary | ICD-10-CM | POA: Insufficient documentation

## 2019-11-24 DIAGNOSIS — I2510 Atherosclerotic heart disease of native coronary artery without angina pectoris: Secondary | ICD-10-CM | POA: Diagnosis not present

## 2019-11-24 DIAGNOSIS — E782 Mixed hyperlipidemia: Secondary | ICD-10-CM | POA: Insufficient documentation

## 2019-11-24 DIAGNOSIS — C20 Malignant neoplasm of rectum: Secondary | ICD-10-CM | POA: Insufficient documentation

## 2019-11-24 DIAGNOSIS — I10 Essential (primary) hypertension: Secondary | ICD-10-CM | POA: Diagnosis not present

## 2019-11-24 HISTORY — PX: IR IMAGING GUIDED PORT INSERTION: IMG5740

## 2019-11-24 LAB — CBC WITH DIFFERENTIAL/PLATELET
Abs Immature Granulocytes: 0.05 10*3/uL (ref 0.00–0.07)
Basophils Absolute: 0.1 10*3/uL (ref 0.0–0.1)
Basophils Relative: 1 %
Eosinophils Absolute: 0.2 10*3/uL (ref 0.0–0.5)
Eosinophils Relative: 1 %
HCT: 33.4 % — ABNORMAL LOW (ref 39.0–52.0)
Hemoglobin: 10.8 g/dL — ABNORMAL LOW (ref 13.0–17.0)
Immature Granulocytes: 0 %
Lymphocytes Relative: 18 %
Lymphs Abs: 2.1 10*3/uL (ref 0.7–4.0)
MCH: 28.6 pg (ref 26.0–34.0)
MCHC: 32.3 g/dL (ref 30.0–36.0)
MCV: 88.6 fL (ref 80.0–100.0)
Monocytes Absolute: 0.9 10*3/uL (ref 0.1–1.0)
Monocytes Relative: 7 %
Neutro Abs: 8.9 10*3/uL — ABNORMAL HIGH (ref 1.7–7.7)
Neutrophils Relative %: 73 %
Platelets: 357 10*3/uL (ref 150–400)
RBC: 3.77 MIL/uL — ABNORMAL LOW (ref 4.22–5.81)
RDW: 18 % — ABNORMAL HIGH (ref 11.5–15.5)
WBC: 12.1 10*3/uL — ABNORMAL HIGH (ref 4.0–10.5)
nRBC: 0 % (ref 0.0–0.2)

## 2019-11-24 LAB — PROTIME-INR
INR: 1.1 (ref 0.8–1.2)
Prothrombin Time: 13.6 seconds (ref 11.4–15.2)

## 2019-11-24 MED ORDER — FENTANYL CITRATE (PF) 100 MCG/2ML IJ SOLN
INTRAMUSCULAR | Status: AC
  Filled 2019-11-24: qty 2

## 2019-11-24 MED ORDER — HEPARIN SOD (PORK) LOCK FLUSH 100 UNIT/ML IV SOLN
INTRAVENOUS | Status: AC
  Filled 2019-11-24: qty 5

## 2019-11-24 MED ORDER — FENTANYL CITRATE (PF) 100 MCG/2ML IJ SOLN
INTRAMUSCULAR | Status: AC | PRN
  Administered 2019-11-24 (×2): 50 ug via INTRAVENOUS

## 2019-11-24 MED ORDER — CLINDAMYCIN PHOSPHATE 900 MG/50ML IV SOLN: 900.0000 mg | Freq: Once | INTRAVENOUS | Status: AC

## 2019-11-24 MED ORDER — LIDOCAINE-EPINEPHRINE 1 %-1:100000 IJ SOLN
INTRAMUSCULAR | Status: AC | PRN
  Administered 2019-11-24 (×2): 10 mL via INTRADERMAL

## 2019-11-24 MED ORDER — MIDAZOLAM HCL 2 MG/2ML IJ SOLN
INTRAMUSCULAR | Status: AC
  Filled 2019-11-24: qty 4

## 2019-11-24 MED ORDER — CLINDAMYCIN PHOSPHATE 900 MG/50ML IV SOLN
INTRAVENOUS | Status: AC
  Administered 2019-11-24: 900 mg via INTRAVENOUS
  Filled 2019-11-24: qty 50

## 2019-11-24 MED ORDER — HEPARIN SOD (PORK) LOCK FLUSH 100 UNIT/ML IV SOLN
INTRAVENOUS | Status: AC | PRN
  Administered 2019-11-24: 500 [IU] via INTRAVENOUS

## 2019-11-24 MED ORDER — MIDAZOLAM HCL 2 MG/2ML IJ SOLN
INTRAMUSCULAR | Status: AC | PRN
  Administered 2019-11-24 (×2): 1 mg via INTRAVENOUS

## 2019-11-24 MED ORDER — SODIUM CHLORIDE 0.9 % IV SOLN: INTRAVENOUS | Status: DC

## 2019-11-24 MED ORDER — LIDOCAINE-EPINEPHRINE 1 %-1:100000 IJ SOLN
INTRAMUSCULAR | Status: AC
  Filled 2019-11-24: qty 1

## 2019-11-24 NOTE — Procedures (Signed)
Interventional Radiology Procedure Note  Procedure: Placement of a right IJ approach single lumen PowerPort.  Tip is positioned at the superior cavoatrial junction and catheter is ready for immediate use.  Complications: No immediate Recommendations:  - Ok to shower tomorrow - Do not submerge for 7 days - Routine line care   Signed,  Rashi Giuliani K. Franki Alcaide, MD   

## 2019-11-24 NOTE — Consult Note (Signed)
Chief Complaint: Patient was seen in consultation today for Port-A-Cath placement  Referring Physician(s): Ennever,Peter R  Supervising Physician: Jacqulynn Cadet  Patient Status: [place]  History of Present Illness: John Douglas is a 64 y.o. 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  Additional medical history as below.  Past Medical History:  Diagnosis Date  . Allergic rhinitis    [facility]  . Anxiety   . Atrial fibrillation ([place])    [facility]  . Benign essential hypertension    [facility]  . Coronary artery disease    [facility]  . Diabetic neuropathy, type II diabetes mellitus ([place])    [facility]  . Iron deficiency anemia due to chronic blood loss 11/18/2019  . Mixed hyperlipidemia    [facility]  . Myocardial infarct [facility])    [facility]  . Obesity, unspecified    [facility]  . Rectal bleeding     [facility]  . 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 ([place]) 11/17/2019  . Wide-complex tachycardia ([place]) 06/04/2019    Past Surgical History:  Procedure Laterality Date  . BIOPSY  10/07/2019   Procedure: BIOPSY;  Surgeon: Lavena Bullion, DO;  Location: [facility];  Service: Gastroenterology;;  . COLONOSCOPY WITH PROPOFOL N/A 10/07/2019   Procedure: COLONOSCOPY WITH PROPOFOL;  Surgeon: Lavena Bullion, DO;  Location: [facility];  Service: Gastroenterology;  Laterality: N/A;  . CORONARY ANGIOPLASTY     3 stents  . LEFT HEART CATH AND CORONARY ANGIOGRAPHY N/A 06/05/2019   Procedure: LEFT HEART CATH AND CORONARY ANGIOGRAPHY;  Surgeon: Nelva Bush, MD;  Location: [place] [facility];  Service: Cardiovascular;   Laterality: N/A;  . POLYPECTOMY  10/07/2019   Procedure: POLYPECTOMY;  Surgeon: Lavena Bullion, DO;  Location: [facility];  Service: Gastroenterology;;  . Lia Foyer TATTOO INJECTION  10/07/2019   Procedure: SUBMUCOSAL TATTOO INJECTION;  Surgeon: Lavena Bullion, DO;  Location: [facility];  Service: Gastroenterology;;    Allergies: Ibuprofen, Metformin and related, Naproxen, Penicillins, and Yellow jacket venom [bee venom]  Medications: Prior to Admission medications   Medication Sig Start Date End Date Taking? Authorizing Provider  acetaminophen (TYLENOL) 325 MG tablet Take 1-2 tablets (325-650 mg total) by mouth every 4 (four) hours as needed for mild pain. 10/21/19  Yes Love, Ivan Anchors, PA-C  clonazePAM (KLONOPIN) 1 MG disintegrating tablet Take 1 tablet (1 mg total) by mouth 2 (two) times daily as needed. 10/23/19  Yes Love, Ivan Anchors, PA-C  clopidogrel (PLAVIX) 75 MG tablet Take 1 tablet (75 mg total) by mouth daily. 10/23/19  Yes Love, Ivan Anchors, PA-C  diltiazem (CARDIZEM CD) 180 MG 24 hr capsule Take 1 capsule (180 mg total) by mouth daily. 11/18/19 02/16/20 Yes End, Harrell Gave, MD  ezetimibe (ZETIA) 10 MG tablet Take 10 mg by mouth at bedtime. 05/25/19  Yes [provider]  fluticasone (FLONASE) 50 MCG/ACT nasal spray Place 1-2 sprays into both nostrils as needed for allergies or rhinitis (or seasonal allergies).    Yes [provider]  gabapentin (NEURONTIN) 100 MG capsule TAKE 1 CAPSULE BY MOUTH AT BEDTIME. 11/19/19  Yes Lovorn, Jinny Blossom, MD  glimepiride (AMARYL) 2 MG tablet Take 1 tablet (2 mg total) by mouth daily with breakfast. 10/24/19  Yes Love, Ivan Anchors,  PA-C  metoprolol succinate (TOPROL XL) 100 MG 24 hr tablet Take 1 tablet (100 mg total) by mouth in the morning and at bedtime. 11/09/19 11/08/20 Yes Sherran Needs, NP  rosuvastatin (CRESTOR) 20 MG tablet Take 1 tablet (20 mg total) by mouth daily. 10/23/19 01/21/20 Yes Love, Ivan Anchors, PA-C  sertraline (ZOLOFT) 50  MG tablet Take 50 mg by mouth daily. 11/16/19  Yes [provider]  traZODone (DESYREL) 50 MG tablet TAKE 1 TABLET (50 MG TOTAL) BY MOUTH AT BEDTIME AS NEEDED FOR SLEEP. 11/19/19  Yes Lovorn, Jinny Blossom, MD  citalopram (CELEXA) 10 MG tablet Take 0.5 tablets (5 mg total) by mouth daily. 10/24/19   Love, Ivan Anchors, PA-C  glucose blood test strip Monitor BS before meals and at bedtime for a couple of weeks. Then may decrease to bid before meals 10/23/19   Love, Ivan Anchors, PA-C  Lancets [facility] ULTRASOFT) lancets Use as instructed 10/23/19   Love, Ivan Anchors, PA-C  nitroGLYCERIN (NITROSTAT) 0.4 MG SL tablet Place 0.4 mg under the tongue every 5 (five) minutes as needed for chest pain.  05/25/19 05/24/20  [provider]     Family History  Problem Relation Age of Onset  . Diverticulitis Mother   . Hypertension Father   . Heart disease Father        MI  . Prostate cancer Father   . Colon cancer Neg Hx   . Stomach cancer Neg Hx   . Pancreatic cancer Neg Hx   . Rectal cancer Neg Hx     Social History   Socioeconomic History  . Marital status: Divorced    Spouse name: Not on file  . Number of children: 2  . Years of education: Not on file  . Highest education level: Not on file  Occupational History  . Occupation: retired  Tobacco Use  . Smoking status: Never Smoker  . Smokeless tobacco: Former Systems developer    Types: Secondary school teacher  . Vaping Use: Never used  Substance and Sexual Activity  . Alcohol use: Not Currently    Comment: occasional  . Drug use: Not Currently    Types: Marijuana  . Sexual activity: Not Currently  Other Topics Concern  . 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 Difficulty of Paying Living Expenses:   Food Insecurity:   . Worried About  Charity fundraiser in the Last Year:   . Arboriculturist in the Last Year:   Transportation Needs:   . Film/video editor (Medical):   Marland Kitchen Lack of Transportation (Non-Medical):   Physical Activity:   . Days of Exercise per Week:   . Minutes of Exercise per Session:   Stress:   . Feeling of Stress :   Social Connections:   . Frequency of Communication with Friends and Family:   . Frequency of Social Gatherings with Friends and Family:   . Attends Religious Services:   . Active Member of Clubs or Organizations:   . Attends Archivist Meetings:   Marland Kitchen Marital Status:      Review of Systems currently denies fever, headache, chest pain, dyspnea, cough, abdominal pain, back pain, nausea, vomiting; he has had prior rectal  bleeding.  Vital Signs: BP (!) 120/87   Pulse 76   Temp 99 F (37.2 C) (Oral)   Resp 20   Ht 5\' 10"  (1.778 m)   Wt (!) 209 lb (94.8 kg)   SpO2 99%   BMI 29.99 kg/m   Physical Exam awake, alert.  Chest clear to auscultation bilaterally.  Heart with normal rate, irregular rhythm.  Abdomen soft, positive bowel sounds, nontender.  No lower extremity edema.  Imaging: DG Chest Port 1 View  Result Date: 11/06/2019 CLINICAL DATA:  AFib EXAM: PORTABLE CHEST 1 VIEW COMPARISON:  10/08/2019 FINDINGS: The heart size and mediastinal contours are within normal limits. Both lungs are clear. The visualized skeletal structures are unremarkable. IMPRESSION: No active disease. Electronically Signed   By: Donavan Foil M.D.   On: 11/06/2019 17:15    Labs:  CBC: Recent Labs    10/23/19 0520 11/06/19 1649 11/17/19 1347 11/24/19 1345  WBC 8.6 10.8* 9.3 12.1*  HGB 13.1 13.4 9.3* 10.8*  HCT 39.8 40.3 27.7* 33.4*  PLT 298 306 252 357    COAGS: No results for input(s): INR, APTT in the last 8760 hours.  BMP: Recent Labs    10/21/19 0936 10/23/19 0520 11/06/19 1649 11/17/19 1347  NA 137 141 139 141  K 3.4* 3.7 3.1* 3.5  CL 103 106 108 106  CO2 24 25 19* 27   GLUCOSE 189* 138* 117* 94  BUN 14 17 24* 17  CALCIUM 8.8* 9.1 9.6 9.3  CREATININE 0.86 0.96 1.07 1.14  GFRNONAA >60 >60 >60 >60  GFRAA >60 >60 >60 >60    LIVER FUNCTION TESTS: Recent Labs    08/28/19 0940 10/07/19 1042 10/16/19 0528 11/17/19 1347  BILITOT 0.5 0.8 0.7 0.9  AST 19 16 12* 10*  ALT 22 16 15 10   ALKPHOS 64 56 51 41  PROT 6.1 6.7 6.1* 6.3*  ALBUMIN 4.0 3.9 3.3* 4.1    TUMOR MARKERS: No results for input(s): AFPTM, CEA, CA199, CHROMGRNA in the last 8760 hours.  Assessment and Plan: 64 y.o. 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 Risks and benefits of image guided port-a-catheter placement was discussed with the patient including, but not limited to bleeding, infection, pneumothorax, or fibrin sheath development and need for additional procedures.  All of the patient's questions were answered, patient is agreeable to proceed. Consent signed and in chart.    Thank you for this interesting consult.  I greatly enjoyed meeting [place] and look forward to participating in their care.  A copy of this report was sent to the requesting provider on this date.  Electronically Signed: D. Rowe Robert, PA-C 11/24/2019, 1:56 PM   I spent a total of  25 minutes   in face to face in clinical consultation, greater than 50% of which was counseling/coordinating care for Port-A-Cath placement

## 2019-11-24 NOTE — Discharge Instructions (Signed)
No lidocaine cream for 2 weeks after your procedure to put in port.  Urgent Needs  - IR MD on call [phone]    Implanted Port Insertion, Care After This sheet gives you information about how to care for yourself after your procedure. Your health care provider may also give you more specific instructions. If you have problems or questions, contact your health care provider. What can I expect after the procedure? After the procedure, it is common to have:  Discomfort at the port insertion site.  Bruising on the skin over the port. This should improve over 3-4 days. Follow these instructions at home: [facility] care  After your port is placed, you will get a manufacturer's information card. The card has information about your port. Keep this card with you at all times.  Take care of the port as told by your health care provider. Ask your health care provider if you or a family member can get training for taking care of the port at home. A home health care nurse may also take care of the port.  Make sure to remember what type of port you have. Incision care  Follow instructions from your health care provider about how to take care of your port insertion site. Make sure you: ? Wash your hands with soap and water before and after you change your bandage (dressing). If soap and water are not available, use hand sanitizer. ? You may remove your dressing 11/25/19 and shower around 4 PM. Replace old dressing with bandaids as necessary.  Check your port insertion site every day for signs of infection. Check for: ? Redness, swelling, or pain. ? Fluid or blood. ? Warmth. ? Pus or a bad smell. Activity  Return to your normal activities as told by your health care provider. Ask your health care provider what activities are safe for you.  Do not lift anything that is heavier than 10 lb (4.5 kg), or the limit that you are told, until your health care provider says that it is safe. General  instructions  Take over-the-counter and prescription medicines only as told by your health care provider.  Do not take baths, swim, or use a hot tub until your health care provider approves. Ask your health care provider if you may take showers. You may only be allowed to take sponge baths.  Do not drive for 24 hours if you were given a sedative during your procedure.  Wear a medical alert bracelet in case of an emergency. This will tell any health care providers that you have a port.  Keep all follow-up visits as told by your health care provider. This is important. Contact a health care provider if:  You cannot flush your port with saline as directed, or you cannot draw blood from the port.  You have a fever or chills.  You have redness, swelling, or pain around your port insertion site.  You have fluid or blood coming from your port insertion site.  Your port insertion site feels warm to the touch.  You have pus or a bad smell coming from the port insertion site. Get help right away if:  You have chest pain or shortness of breath.  You have bleeding from your port that you cannot control. Summary  Take care of the port as told by your health care provider. Keep the manufacturer's information card with you at all times.  Change your dressing as told by your health care provider.  Contact a health  care provider if you have a fever or chills or if you have redness, swelling, or pain around your port insertion site.  Keep all follow-up visits as told by your health care provider.  Your provider should set up monthly port flushes This information is not intended to replace advice given to you by your health care provider. Make sure you discuss any questions you have with your health care provider. Document Revised: 11/12/2017 Document Reviewed: 11/12/2017 Elsevier Patient Education  [place].  Moderate Conscious Sedation, Adult, Care After These instructions  provide you with information about caring for yourself after your procedure. Your health care provider may also give you more specific instructions. Your treatment has been planned according to current medical practices, but problems sometimes occur. Call your health care provider if you have any problems or questions after your procedure. What can I expect after the procedure? After your procedure, it is common:  To feel sleepy for several hours.  To feel clumsy and have poor balance for several hours.  To have poor judgment for several hours.  To vomit if you eat too soon. Follow these instructions at home: For at least 24 hours after the procedure:   Do not: ? Participate in activities where you could fall or become injured. ? Drive. ? Use heavy machinery. ? Drink alcohol. ? Take sleeping pills or medicines that cause drowsiness. ? Make important decisions or sign legal documents. ? Take care of children on your own.  Rest. Eating and drinking  Follow the diet recommended by your health care provider.  If you vomit: ? Drink water, juice, or soup when you can drink without vomiting. ? Make sure you have little or no nausea before eating solid foods. General instructions  Have a responsible adult stay with you until you are awake and alert.  Take over-the-counter and prescription medicines only as told by your health care provider.  If you smoke, do not smoke without supervision.  Keep all follow-up visits as told by your health care provider. This is important. Contact a health care provider if:  You keep feeling nauseous or you keep vomiting.  You feel light-headed.  You develop a rash.  You have a fever. Get help right away if:  You have trouble breathing. This information is not intended to replace advice given to you by your health care provider. Make sure you discuss any questions you have with your health care provider. Document Revised: 03/29/2017  Document Reviewed: 08/06/2015 Elsevier Patient Education  2020 [employer].

## 2019-11-25 ENCOUNTER — Encounter: Payer: Self-pay | Admitting: Pharmacist

## 2019-11-25 ENCOUNTER — Other Ambulatory Visit: Payer: Self-pay | Admitting: Hematology & Oncology

## 2019-11-25 ENCOUNTER — Inpatient Hospital Stay: Payer: Medicare HMO

## 2019-11-25 ENCOUNTER — Encounter: Payer: Self-pay | Admitting: *Deleted

## 2019-11-25 ENCOUNTER — Other Ambulatory Visit: Payer: Self-pay | Admitting: *Deleted

## 2019-11-25 VITALS — BP 95/56 | HR 86 | Temp 98.4°F | Resp 18

## 2019-11-25 DIAGNOSIS — D500 Iron deficiency anemia secondary to blood loss (chronic): Secondary | ICD-10-CM

## 2019-11-25 DIAGNOSIS — K5730 Diverticulosis of large intestine without perforation or abscess without bleeding: Secondary | ICD-10-CM

## 2019-11-25 DIAGNOSIS — C775 Secondary and unspecified malignant neoplasm of intrapelvic lymph nodes: Secondary | ICD-10-CM

## 2019-11-25 DIAGNOSIS — C186 Malignant neoplasm of descending colon: Secondary | ICD-10-CM

## 2019-11-25 DIAGNOSIS — K625 Hemorrhage of anus and rectum: Secondary | ICD-10-CM

## 2019-11-25 DIAGNOSIS — Z5111 Encounter for antineoplastic chemotherapy: Secondary | ICD-10-CM | POA: Diagnosis not present

## 2019-11-25 DIAGNOSIS — C20 Malignant neoplasm of rectum: Secondary | ICD-10-CM

## 2019-11-25 LAB — CBC WITH DIFFERENTIAL (CANCER CENTER ONLY)
Abs Immature Granulocytes: 0.03 10*3/uL (ref 0.00–0.07)
Basophils Absolute: 0.1 10*3/uL (ref 0.0–0.1)
Basophils Relative: 1 %
Eosinophils Absolute: 0.1 10*3/uL (ref 0.0–0.5)
Eosinophils Relative: 1 %
HCT: 32.3 % — ABNORMAL LOW (ref 39.0–52.0)
Hemoglobin: 10.7 g/dL — ABNORMAL LOW (ref 13.0–17.0)
Immature Granulocytes: 0 %
Lymphocytes Relative: 16 %
Lymphs Abs: 1.5 10*3/uL (ref 0.7–4.0)
MCH: 28.8 pg (ref 26.0–34.0)
MCHC: 33.1 g/dL (ref 30.0–36.0)
MCV: 87.1 fL (ref 80.0–100.0)
Monocytes Absolute: 0.6 10*3/uL (ref 0.1–1.0)
Monocytes Relative: 6 %
Neutro Abs: 7.2 10*3/uL (ref 1.7–7.7)
Neutrophils Relative %: 76 %
Platelet Count: 329 10*3/uL (ref 150–400)
RBC: 3.71 MIL/uL — ABNORMAL LOW (ref 4.22–5.81)
RDW: 17.7 % — ABNORMAL HIGH (ref 11.5–15.5)
WBC Count: 9.3 10*3/uL (ref 4.0–10.5)
nRBC: 0 % (ref 0.0–0.2)

## 2019-11-25 LAB — CMP (CANCER CENTER ONLY)
ALT: 13 U/L (ref 0–44)
AST: 9 U/L — ABNORMAL LOW (ref 15–41)
Albumin: 4.2 g/dL (ref 3.5–5.0)
Alkaline Phosphatase: 46 U/L (ref 38–126)
Anion gap: 7 (ref 5–15)
BUN: 24 mg/dL — ABNORMAL HIGH (ref 8–23)
CO2: 28 mmol/L (ref 22–32)
Calcium: 9.7 mg/dL (ref 8.9–10.3)
Chloride: 106 mmol/L (ref 98–111)
Creatinine: 1.03 mg/dL (ref 0.61–1.24)
GFR, Est AFR Am: >60 mL/min (ref >60)
GFR, Estimated: >60 mL/min (ref >60)
Glucose, Bld: 165 mg/dL — ABNORMAL HIGH (ref 70–99)
Potassium: 3.5 mmol/L (ref 3.5–5.1)
Sodium: 141 mmol/L (ref 135–145)
Total Bilirubin: 1 mg/dL (ref 0.3–1.2)
Total Protein: 6.5 g/dL (ref 6.5–8.1)

## 2019-11-25 LAB — IRON AND TIBC
Iron: 50 ug/dL (ref 42–163)
Saturation Ratios: 16 % — ABNORMAL LOW (ref 20–55)
TIBC: 315 ug/dL (ref 202–409)
UIBC: 265 ug/dL (ref 117–376)

## 2019-11-25 LAB — GLUCOSE, CAPILLARY: Glucose-Capillary: 128 mg/dL — ABNORMAL HIGH (ref 70–99)

## 2019-11-25 LAB — FERRITIN: Ferritin: 37 ng/mL (ref 24–336)

## 2019-11-25 LAB — CEA (IN HOUSE-CHCC): CEA (CHCC-In House): 12.19 ng/mL — ABNORMAL HIGH (ref 0.00–5.00)

## 2019-11-25 MED ORDER — DEXTROSE 5 % IV SOLN
Freq: Once | INTRAVENOUS | Status: AC
  Filled 2019-11-25: qty 250

## 2019-11-25 MED ORDER — LORAZEPAM 0.5 MG PO TABS: 0.5000 mg | ORAL_TABLET | Freq: Four times a day (QID) | ORAL | 0 refills | Status: DC | PRN

## 2019-11-25 MED ORDER — PALONOSETRON HCL INJECTION 0.25 MG/5ML
INTRAVENOUS | Status: AC
  Filled 2019-11-25: qty 5

## 2019-11-25 MED ORDER — OXALIPLATIN CHEMO INJECTION 100 MG/20ML
76.5000 mg/m2 | Freq: Once | INTRAVENOUS | Status: AC
  Administered 2019-11-25: 165 mg via INTRAVENOUS
  Filled 2019-11-25: qty 33

## 2019-11-25 MED ORDER — LIDOCAINE-PRILOCAINE 2.5-2.5 % EX CREA: TOPICAL_CREAM | CUTANEOUS | 3 refills | Status: DC

## 2019-11-25 MED ORDER — PROCHLORPERAZINE MALEATE 10 MG PO TABS: 10.0000 mg | ORAL_TABLET | Freq: Four times a day (QID) | ORAL | 1 refills | Status: DC | PRN

## 2019-11-25 MED ORDER — PALONOSETRON HCL INJECTION 0.25 MG/5ML
0.2500 mg | Freq: Once | INTRAVENOUS | Status: AC
  Administered 2019-11-25: 0.25 mg via INTRAVENOUS

## 2019-11-25 MED ORDER — SODIUM CHLORIDE 0.9 % IV SOLN
510.0000 mg | Freq: Once | INTRAVENOUS | Status: AC
  Administered 2019-11-25: 510 mg via INTRAVENOUS
  Filled 2019-11-25: qty 17

## 2019-11-25 MED ORDER — SODIUM CHLORIDE 0.9 % IV SOLN
Freq: Once | INTRAVENOUS | Status: AC
  Filled 2019-11-25: qty 250

## 2019-11-25 MED ORDER — ONDANSETRON HCL 8 MG PO TABS: 8.0000 mg | ORAL_TABLET | Freq: Two times a day (BID) | ORAL | 1 refills | Status: DC | PRN

## 2019-11-25 MED ORDER — FLUOROURACIL CHEMO INJECTION 2.5 GM/50ML
400.0000 mg/m2 | Freq: Once | INTRAVENOUS | Status: AC
  Administered 2019-11-25: 850 mg via INTRAVENOUS
  Filled 2019-11-25: qty 17

## 2019-11-25 MED ORDER — ACETAMINOPHEN 325 MG PO TABS
650.0000 mg | ORAL_TABLET | Freq: Once | ORAL | Status: AC
  Administered 2019-11-25: 650 mg via ORAL

## 2019-11-25 MED ORDER — FAMOTIDINE IN NACL 20-0.9 MG/50ML-% IV SOLN
20.0000 mg | Freq: Once | INTRAVENOUS | Status: AC
  Administered 2019-11-25: 20 mg via INTRAVENOUS

## 2019-11-25 MED ORDER — DEXAMETHASONE 4 MG PO TABS: 8.0000 mg | ORAL_TABLET | Freq: Every day | ORAL | 1 refills | Status: DC

## 2019-11-25 MED ORDER — LEUCOVORIN CALCIUM INJECTION 350 MG
400.0000 mg/m2 | Freq: Once | INTRAVENOUS | Status: AC
  Administered 2019-11-25: 864 mg via INTRAVENOUS
  Filled 2019-11-25: qty 43.2

## 2019-11-25 MED ORDER — ACETAMINOPHEN 325 MG PO TABS
ORAL_TABLET | ORAL | Status: AC
  Filled 2019-11-25: qty 2

## 2019-11-25 MED ORDER — FAMOTIDINE IN NACL 20-0.9 MG/50ML-% IV SOLN
INTRAVENOUS | Status: AC
  Filled 2019-11-25: qty 50

## 2019-11-25 MED ORDER — SODIUM CHLORIDE 0.9 % IV SOLN
1920.0000 mg/m2 | INTRAVENOUS | Status: DC
  Administered 2019-11-25: 4150 mg via INTRAVENOUS
  Filled 2019-11-25: qty 83

## 2019-11-25 MED ORDER — SODIUM CHLORIDE 0.9 % IV SOLN
10.0000 mg | Freq: Once | INTRAVENOUS | Status: AC
  Administered 2019-11-25: 10 mg via INTRAVENOUS
  Filled 2019-11-25: qty 10

## 2019-11-25 NOTE — Patient Instructions (Signed)
[facility] Discharge Instructions for Patients Receiving Chemotherapy  Today you received the following chemotherapy agents Oxaliplatin, Leukovorin, 5FU  To help prevent nausea and vomiting after your treatment, we encourage you to take your nausea medication as directed   If you develop nausea and vomiting that is not controlled by your nausea medication, call the clinic.   BELOW ARE SYMPTOMS THAT SHOULD BE REPORTED IMMEDIATELY:  *FEVER GREATER THAN 100.5 F  *CHILLS WITH OR WITHOUT FEVER  NAUSEA AND VOMITING THAT IS NOT CONTROLLED WITH YOUR NAUSEA MEDICATION  *UNUSUAL SHORTNESS OF BREATH  *UNUSUAL BRUISING OR BLEEDING  TENDERNESS IN MOUTH AND THROAT WITH OR WITHOUT PRESENCE OF ULCERS  *URINARY PROBLEMS  *BOWEL PROBLEMS  UNUSUAL RASH Items with * indicate a potential emergency and should be followed up as soon as possible.  Feel free to call the clinic should you have any questions or concerns. The clinic phone number is [phone].  Please show the [place] at check-in to the Emergency Department and triage nurse.

## 2019-11-25 NOTE — Patient Instructions (Signed)
Patient in chemotherapy education class with daughter [name].  Discussed side effects of Oxaliplatin, 5FU, Leucovorin  which include but are not limited to myelosuppression, decreased appetite, fatigue, fever, allergic or infusional reaction, mucositis, cardiac toxicity, cough, SOB, altered taste, nausea and vomiting, diarrhea, constipation, elevated LFTs myalgia and arthralgias, hair loss or thinning, rash, skin dryness, nail changes, peripheral neuropathy, discolored urine, delayed wound healing, mental changes (Chemo brain), increased risk of infections, weight loss.  Reviewed infusion room and office policy and procedure and phone numbers 24 hours x 7 days a week.  Reviewed ambulatory pump specifics and how to manage safe handling at home.  Reviewed when to call the office with any concerns or problems.  Scientist, clinical (histocompatibility and immunogenetics) given.  Discussed portacath insertion and EMLA cream administration.  Antiemetic protocol and chemotherapy schedule reviewed. Patient verbalized understanding of chemotherapy indications and possible side effects.  Teachback done

## 2019-11-25 NOTE — Patient Instructions (Signed)

## 2019-11-25 NOTE — Progress Notes (Signed)
Okay to give Feraheme immediately after chemotherapy premedications and start chemo immediately after Feraheme administration per Dr. Marin Olp.

## 2019-11-25 NOTE — Progress Notes (Signed)
Visited with patient in the treatment room. He is here to begin his treatment. He had his port placed yesterday, and just finished chemo education with his daughter this morning. He feels ready to start.  Discussed with patient a referral to nutrition. At this time he feels like it's unnecessary. He states he's diabetic and has a good handle on his dietary needs. Instructed him to reach out at any point if he felt like he could use some dietary reinforcement or if he felt like he was losing weight/experiencing side effects impacting his intake. He agreed. Also informed patient that we keep samples of ensure or boost in the office and if he ever felt like he needed some, to ask.  Oncology Nurse Navigator Documentation  Oncology Nurse Navigator Flowsheets 11/25/2019  Abnormal Finding Date -  Confirmed Diagnosis Date -  Planned Course of Treatment Chemotherapy  Phase of Treatment Chemo  Chemotherapy Actual Start Date: 11/25/2019  Navigator Follow Up Date: 12/09/2019  Navigator Follow Up Reason: Follow-up Appointment;Chemotherapy  Navigator Location [facility]  Navigator Encounter Type Treatment  Patient Visit Type MedOnc  Treatment Phase First Chemo Tx  Barriers/Navigation Needs Coordination of Care;Education  Education -  Interventions Psycho-Social Support;Referrals  Acuity Level 2-Minimal Needs (1-2 Barriers Identified)  Referrals Nutrition/dietician  Coordination of Care -  Education Method -  Support Groups/Services Friends and Family  Time Spent with Patient 30

## 2019-11-26 ENCOUNTER — Encounter: Payer: Self-pay | Admitting: Physical Therapy

## 2019-11-26 ENCOUNTER — Ambulatory Visit: Payer: Medicare HMO | Admitting: Physical Therapy

## 2019-11-26 ENCOUNTER — Other Ambulatory Visit: Payer: Self-pay

## 2019-11-26 DIAGNOSIS — M6281 Muscle weakness (generalized): Secondary | ICD-10-CM

## 2019-11-26 DIAGNOSIS — R2681 Unsteadiness on feet: Secondary | ICD-10-CM | POA: Diagnosis not present

## 2019-11-26 DIAGNOSIS — I63531 Cerebral infarction due to unspecified occlusion or stenosis of right posterior cerebral artery: Secondary | ICD-10-CM

## 2019-11-26 DIAGNOSIS — R278 Other lack of coordination: Secondary | ICD-10-CM

## 2019-11-26 DIAGNOSIS — R2689 Other abnormalities of gait and mobility: Secondary | ICD-10-CM

## 2019-11-26 NOTE — Therapy (Signed)
[place] MAIN [facility] SERVICES [address] [place], [place], [address] Phone: [phone]   Fax:  [phone]  Physical Therapy Treatment  Patient Details  Name: John Douglas MRN: [id number] Date of Birth: March 09, 1956 Referring Provider (PT): Reesa Chew    Encounter Date: 11/26/2019   PT End of Session - 11/26/19 1111    Visit Number 3    Number of Visits 16    Date for PT Re-Evaluation 01/07/20    Authorization Type 1/10 eval 11/12/19    Authorization Time Period FOTO performed    PT Start Time 1105    PT Stop Time 1145    PT Time Calculation (min) 40 min    Equipment Utilized During Treatment Gait belt    Activity Tolerance Patient tolerated treatment well    Behavior During Therapy [facility] for tasks assessed/performed;Impulsive           Past Medical History:  Diagnosis Date  . Allergic rhinitis    [facility]  . Anxiety   . Atrial fibrillation ([place])    [facility]  . Benign essential hypertension    [facility]  . Coronary artery disease    [facility]  . Diabetic neuropathy, type II diabetes mellitus ([place])    [facility]  . Iron deficiency anemia due to chronic blood loss 11/18/2019  . Mixed hyperlipidemia    [facility]  . Myocardial infarct [facility])    [facility]  . Obesity, unspecified    [facility]  . Rectal bleeding    [facility]  . 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 ([place]) 11/17/2019  . Wide-complex tachycardia ([place]) 06/04/2019    Past Surgical History:  Procedure Laterality Date  . BIOPSY  10/07/2019   Procedure: BIOPSY;  Surgeon: Lavena Bullion, DO;  Location: [facility];  Service: Gastroenterology;;  . COLONOSCOPY WITH PROPOFOL N/A 10/07/2019   Procedure: COLONOSCOPY WITH PROPOFOL;  Surgeon: Lavena Bullion,  DO;  Location: [facility];  Service: Gastroenterology;  Laterality: N/A;  . CORONARY ANGIOPLASTY     3 stents  . IR IMAGING GUIDED PORT INSERTION  11/24/2019  . LEFT HEART CATH AND CORONARY ANGIOGRAPHY N/A 06/05/2019   Procedure: LEFT HEART CATH AND CORONARY ANGIOGRAPHY;  Surgeon: Nelva Bush, MD;  Location: [place] CV LAB;  Service: Cardiovascular;  Laterality: N/A;  . POLYPECTOMY  10/07/2019   Procedure: POLYPECTOMY;  Surgeon: Lavena Bullion, DO;  Location: [facility];  Service: Gastroenterology;;  . Lia Foyer TATTOO INJECTION  10/07/2019   Procedure: SUBMUCOSAL TATTOO INJECTION;  Surgeon: Lavena Bullion, DO;  Location: [facility];  Service: Gastroenterology;;    There were no vitals filed for this visit.   Subjective Assessment - 11/26/19 1110    Subjective Patient reported he has tried some exercises since evaluation, He just started his first session of chem yesterday.    Patient is accompained by: Family member    Pertinent History Pt is a 64 y.o. male with PMH of wide-complex tachycardia, hyperlipidemia, HTN, rectal bleeding, myocardial infarct, DM, CAD, and a fib w/ no anticoagulation. Per daughter, 10/06/2019 PM pt became dizzy and disoriented which caused him to fall. He was taking medications for colon prep for colonoscopy to have on 10/07/2019. EMS was called and bought to ED - colonoscopy was performed later that day. Pt was brought to [facility] found to have A. fib  with RVR and a heart rate of 150.  CT of head was obtained showing that he did have an infarct in the R PCA distribution. After hospital he went to inpatient rehab for 6 days. His daughter now stays will him until he is "better". . Patient went to the ED on 11/06/19 for paroxysmal a fib. Per patient and daughter is unable to have tx for cardiac issues until cancer is addressed.  Has a membership at a pool and goes there and does exercises.    How long can you stand comfortably? hasn't tried yet.    How  long can you walk comfortably? uses a walker for balance, limited    Patient Stated Goals walk without walker, walk normal, better balance.    Currently in Pain? No/denies    Pain Score 0-No pain                Ther-ex  Octane fitness x 5 mins  Tandem stand on 1/2 foam with 2 lb weights with ball in BUE and fwd/ext x 20  Tandem stand on 1/2 foam, flat side down  with trunk rotation Standing on 1/2 foam flat side up with 2 lbs ankle weights and trunk rotation and head turns Lunges to BOSU ball x 15 BLE Heel raises x 15 x 2 sets Step ups to 6-inch stool x 20 , leading with L LE ascending and RLE descending Eccentric step downs from 3 inch stool x 10 verbal cues to complete slow and tap heel.  BUE CGA High marching with 2 lbs ankle weights  X 20  Leg press 40 lbs x 10 x 2 single leg at a time Leg press 25 lbs heel raise LLE x 10 x 2  Patient needs occasional verbal cueing to improve posture and cueing to correctly perform exercises slowly, holding at end of range to increase motor firing of desired muscle to encourage fatigue.                       PT Education - 11/26/19 1110    Education Details HEP    Person(s) Educated Patient    Methods Explanation    Comprehension Tactile cues required;Need further instruction            PT Short Term Goals - 11/12/19 1245      PT SHORT TERM GOAL #1   Title Patient will be independent in home exercise program to improve strength/mobility for better functional independence with ADLs.    Baseline 7/15: HEP given    Time 4    Period Weeks    Status New    Target Date 12/10/19             PT Long Term Goals - 11/12/19 1245      PT LONG TERM GOAL #1   Title Patient will increase FOTO score to equal to or greater than     to demonstrate statistically significant improvement in mobility and quality of life.    Baseline 7/15: FOTO to do next session    Time 8    Period Weeks    Status New    Target Date  01/07/20      PT LONG TERM GOAL #2   Title Patient will demonstrate an improved Berg Balance Score of >46/56  as to demonstrate improved balance with ADLs such as sitting/standing and transfer balance and reduced fall risk.    Baseline 7/15: 40/56    Time 8  Period Weeks    Status New    Target Date 01/07/20      PT LONG TERM GOAL #3   Title Patient will increase ABC scale score >80% to demonstrate better functional mobility and better confidence with ADLs.    Baseline 7/15: 64.3%    Time 8    Period Weeks    Status New    Target Date 01/07/20      PT LONG TERM GOAL #4   Title Patient will increase BLE gross strength to 4+/5 as to improve functional strength for independent gait, increased standing tolerance and increased ADL ability.    Baseline 7/15: see note    Time 8    Period Weeks    Status New    Target Date 01/07/20                 Plan - 11/26/19 1111    Clinical Impression Statement Pt is making improvements in LLE strength and balance as evidenced by improvements in dynamic standing balance.  Patient can use the quad cane more and home and increased gait speed.  Patient will benefit from continued skilled PT interventions for improved strength, balance, and QOL   Personal Factors and Comorbidities Comorbidity 3+;Time since onset of injury/illness/exacerbation;Transportation;Past/Current Experience    Comorbidities GAD, DM, leukocytosis, colon cancer, CAD, HTN, NSTMI, ventricular tachycardia    Examination-Activity Limitations Caring for [employer];Locomotion Level;Stand;Toileting;Transfers    Examination-Participation Restrictions Church;Cleaning;Community Activity;Driving;Laundry;Volunteer;Shop;Meal Prep;Yard Work    [employer]chant [employer] Evolving/Moderate complexity    Rehab Potential Fair    PT Frequency 2x / week    PT Duration 8 weeks    PT Treatment/Interventions ADLs/Self Care Home Management;Aquatic  Therapy;Biofeedback;Canalith Repostioning;Cryotherapy;Moist Heat;Traction;DME Instruction;Gait training;Stair training;Functional mobility training;Therapeutic activities;Patient/family education;Neuromuscular re-education;Balance training;Therapeutic exercise;Manual techniques;Energy conservation;Dry needling;Passive range of motion;Taping;Vasopneumatic Device;Vestibular    PT Next Visit Plan FOTO; balance on airex pad    PT Home Exercise Plan see above    Consulted and Agree with Plan of Care Patient;Family member/caregiver    Family Member Consulted daughter           Patient will benefit from skilled therapeutic intervention in order to improve the following deficits and impairments:  Abnormal gait, Cardiopulmonary status limiting activity, Decreased activity tolerance, Decreased balance, Decreased knowledge of precautions, Decreased endurance, Decreased coordination, Decreased mobility, Difficulty walking, Decreased strength, Impaired UE functional use, Postural dysfunction, Improper body mechanics  Visit Diagnosis: Muscle weakness (generalized)  Other lack of coordination  Unsteadiness on feet  Other abnormalities of gait and mobility  Acute ischemic right PCA stroke [facility])     Problem List Patient Active Problem List   Diagnosis Date Noted  . Iron deficiency anemia due to chronic blood loss 11/18/2019  . 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 ([place]) 11/17/2019  . Generalized anxiety disorder 10/26/2019  . Diabetes mellitus ([place]) 10/26/2019  . Acute ischemic right PCA stroke ([place]) 10/15/2019  . Ischemic stroke ([place]) 10/08/2019  . Hypokalemia   . Change in bowel habits   . Rectal bleeding   . Polyp of descending colon   . Diverticulosis of colon without hemorrhage   . Gastrointestinal hemorrhage 08/29/2019  . Coronary artery disease 08/29/2019  . Ischemic cardiomyopathy 08/29/2019  . Paroxysmal atrial fibrillation ([facility])   . Essential hypertension   .  Hyperlipidemia LDL goal <70   . Non-ST elevation (NSTEMI) myocardial infarction ([place])   . Ventricular tachycardia ([place]) 06/04/2019    Alanson Puls, PT DPT 11/26/2019, 11:15 AM  [place]  [place] MAIN [facility] SERVICES [address] [place], [place], [address] Phone: [phone]   Fax:  [phone]  Name: Princeton Nabor MRN: [id number] Date of Birth: 11/27/1955

## 2019-11-27 ENCOUNTER — Other Ambulatory Visit: Payer: Self-pay

## 2019-11-27 ENCOUNTER — Ambulatory Visit (INDEPENDENT_AMBULATORY_CARE_PROVIDER_SITE_OTHER): Payer: Medicare HMO | Admitting: Physician Assistant

## 2019-11-27 ENCOUNTER — Encounter: Payer: Self-pay | Admitting: Physician Assistant

## 2019-11-27 ENCOUNTER — Inpatient Hospital Stay: Payer: Medicare HMO

## 2019-11-27 VITALS — BP 126/60 | HR 88 | Ht 70.0 in | Wt 207.5 lb

## 2019-11-27 VITALS — BP 112/65 | HR 69 | Temp 98.4°F | Resp 18

## 2019-11-27 DIAGNOSIS — I63439 Cerebral infarction due to embolism of unspecified posterior cerebral artery: Secondary | ICD-10-CM

## 2019-11-27 DIAGNOSIS — I2510 Atherosclerotic heart disease of native coronary artery without angina pectoris: Secondary | ICD-10-CM

## 2019-11-27 DIAGNOSIS — I4892 Unspecified atrial flutter: Secondary | ICD-10-CM | POA: Diagnosis not present

## 2019-11-27 DIAGNOSIS — I4891 Unspecified atrial fibrillation: Secondary | ICD-10-CM | POA: Diagnosis not present

## 2019-11-27 DIAGNOSIS — I255 Ischemic cardiomyopathy: Secondary | ICD-10-CM | POA: Diagnosis not present

## 2019-11-27 DIAGNOSIS — I10 Essential (primary) hypertension: Secondary | ICD-10-CM

## 2019-11-27 DIAGNOSIS — C775 Secondary and unspecified malignant neoplasm of intrapelvic lymph nodes: Secondary | ICD-10-CM

## 2019-11-27 DIAGNOSIS — E785 Hyperlipidemia, unspecified: Secondary | ICD-10-CM

## 2019-11-27 DIAGNOSIS — I5022 Chronic systolic (congestive) heart failure: Secondary | ICD-10-CM | POA: Diagnosis not present

## 2019-11-27 DIAGNOSIS — C20 Malignant neoplasm of rectum: Secondary | ICD-10-CM

## 2019-11-27 DIAGNOSIS — I472 Ventricular tachycardia, unspecified: Secondary | ICD-10-CM

## 2019-11-27 MED ORDER — SODIUM CHLORIDE 0.9% FLUSH
10.0000 mL | INTRAVENOUS | Status: DC | PRN
  Filled 2019-11-27: qty 10

## 2019-11-27 MED ORDER — HEPARIN SOD (PORK) LOCK FLUSH 100 UNIT/ML IV SOLN
500.0000 [IU] | Freq: Once | INTRAVENOUS | Status: DC | PRN
  Filled 2019-11-27: qty 5

## 2019-11-27 NOTE — Progress Notes (Signed)
5-fu pump dc'd. Site unremarkable.

## 2019-11-27 NOTE — Patient Instructions (Signed)
Medication Instructions: Your physician recommends that you continue on your current medications as directed. Please refer to the Current Medication list given to you today.  *If you need a refill on your cardiac medications before your next appointment, please call your pharmacy*   Lab Work:none ordered If you have labs (blood work) drawn today and your tests are completely normal, you will receive your results only by: Marland Kitchen MyChart Message (if you have MyChart) OR . A paper copy in the mail If you have any lab test that is abnormal or we need to change your treatment, we will call you to review the results.   Testing/Procedures:none ordered   Follow-Up: At [facility], you and your health needs are our priority.  As part of our continuing mission to provide you with exceptional heart care, we have created designated Provider Care Teams.  These Care Teams include your primary Cardiologist (physician) and Advanced Practice Providers (APPs -  Physician Assistants and Nurse Practitioners) who all work together to provide you with the care you need, when you need it.  We recommend signing up for the patient portal called "MyChart".  Sign up information is provided on this After Visit Summary.  MyChart is used to connect with patients for Virtual Visits (Telemedicine).  Patients are able to view lab/test results, encounter notes, upcoming appointments, etc.  Non-urgent messages can be sent to your provider as well.   To learn more about what you can do with MyChart, go to [email or web address].    Your next appointment:   3 month(s)  The format for your next appointment:   In Person  Provider:    You may see Nelva Bush, MD or Christell Faith, PA-C

## 2019-12-01 ENCOUNTER — Encounter: Payer: Medicare HMO | Admitting: Occupational Therapy

## 2019-12-03 ENCOUNTER — Ambulatory Visit: Payer: Medicare HMO

## 2019-12-07 ENCOUNTER — Ambulatory Visit: Payer: Medicare HMO | Admitting: Physical Therapy

## 2019-12-07 ENCOUNTER — Encounter: Payer: Medicare HMO | Admitting: Occupational Therapy

## 2019-12-08 ENCOUNTER — Other Ambulatory Visit: Payer: Self-pay | Admitting: *Deleted

## 2019-12-08 ENCOUNTER — Encounter: Payer: Self-pay | Admitting: Physical Therapy

## 2019-12-08 ENCOUNTER — Ambulatory Visit: Payer: Medicare HMO | Attending: Physical Medicine and Rehabilitation

## 2019-12-08 ENCOUNTER — Other Ambulatory Visit: Payer: Self-pay

## 2019-12-08 DIAGNOSIS — R2689 Other abnormalities of gait and mobility: Secondary | ICD-10-CM | POA: Insufficient documentation

## 2019-12-08 DIAGNOSIS — R2681 Unsteadiness on feet: Secondary | ICD-10-CM | POA: Diagnosis present

## 2019-12-08 DIAGNOSIS — M6281 Muscle weakness (generalized): Secondary | ICD-10-CM | POA: Diagnosis present

## 2019-12-08 DIAGNOSIS — I63531 Cerebral infarction due to unspecified occlusion or stenosis of right posterior cerebral artery: Secondary | ICD-10-CM

## 2019-12-08 DIAGNOSIS — C775 Secondary and unspecified malignant neoplasm of intrapelvic lymph nodes: Secondary | ICD-10-CM

## 2019-12-08 DIAGNOSIS — R278 Other lack of coordination: Secondary | ICD-10-CM | POA: Insufficient documentation

## 2019-12-08 NOTE — Therapy (Signed)
[place] MAIN [facility] SERVICES [address] [place], [place], [address] Phone: [phone]   Fax:  [phone]  Physical Therapy Treatment  Patient Details  Name: John Douglas MRN: [id number] Date of Birth: [birth year] Referring Provider (PT): Reesa Chew    Encounter Date: 12/08/2019   PT End of Session - 12/08/19 1306    Visit Number 4    Number of Visits 16    Date for PT Re-Evaluation 01/07/20    Authorization Type 1/10 eval 11/12/19    Authorization Time Period FOTO performed    PT Start Time 1300    PT Stop Time 1345    PT Time Calculation (min) 45 min    Equipment Utilized During Treatment Gait belt    Activity Tolerance Patient tolerated treatment well    Behavior During Therapy [facility] for tasks assessed/performed;Impulsive           Past Medical History:  Diagnosis Date  . Allergic rhinitis    [facility]  . Anxiety   . Atrial fibrillation ([place])    [facility]  . Benign essential hypertension    [facility]  . Coronary artery disease    [facility]  . Diabetic neuropathy, type II diabetes mellitus ([place])    [facility]  . Iron deficiency anemia due to chronic blood loss 11/18/2019  . Mixed hyperlipidemia    [facility]  . Myocardial infarct [facility])    [facility]  . Obesity, unspecified    [facility]  . Rectal bleeding     [facility]  . 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 ([place]) 11/17/2019  . Wide-complex tachycardia ([place]) 06/04/2019    Past Surgical History:  Procedure Laterality Date  . BIOPSY  10/07/2019   Procedure: BIOPSY;  Surgeon: Lavena Bullion, DO;  Location: [facility];  Service: Gastroenterology;;  . COLONOSCOPY WITH PROPOFOL N/A 10/07/2019   Procedure: COLONOSCOPY WITH PROPOFOL;  Surgeon: Lavena Bullion,  DO;  Location: [facility];  Service: Gastroenterology;  Laterality: N/A;  . CORONARY ANGIOPLASTY     3 stents  . IR IMAGING GUIDED PORT INSERTION  11/24/2019  . LEFT HEART CATH AND CORONARY ANGIOGRAPHY N/A 06/05/2019   Procedure: LEFT HEART CATH AND CORONARY ANGIOGRAPHY;  Surgeon: Nelva Bush, MD;  Location: [place] [facility];  Service: Cardiovascular;  Laterality: N/A;  . POLYPECTOMY  10/07/2019   Procedure: POLYPECTOMY;  Surgeon: Lavena Bullion, DO;  Location: [facility];  Service: Gastroenterology;;  . Lia Foyer TATTOO INJECTION  10/07/2019   Procedure: SUBMUCOSAL TATTOO INJECTION;  Surgeon: Lavena Bullion, DO;  Location: [facility];  Service: Gastroenterology;;    There were no vitals filed for this visit.   Subjective Assessment - 12/08/19 1305    Subjective Patient reported that he is doing well today, no falls or stumbles or changes to medications.    Patient is accompained by: Family member    Pertinent History Pt is a 64 y.o. male with PMH of wide-complex tachycardia, hyperlipidemia, HTN, rectal bleeding, myocardial infarct, DM, CAD, and a fib w/ no anticoagulation. Per daughter, 10/06/2019 PM pt became dizzy and disoriented which caused him to fall. He was taking medications for colon prep for colonoscopy to have on 10/07/2019. EMS was called and bought to ED - colonoscopy was performed later that day. Pt was brought to [facility] found to have A. fib with RVR  and a heart rate of 150.  CT of head was obtained showing that he did have an infarct in the R PCA distribution. After hospital he went to inpatient rehab for 6 days. His daughter now stays will him until he is "better". . Patient went to the ED on 11/06/19 for paroxysmal a fib. Per patient and daughter is unable to have tx for cardiac issues until cancer is addressed.  Has a membership at a pool and goes there and does exercises.    How long can you stand comfortably? hasn't tried yet.    How long can you walk  comfortably? uses a walker for balance, limited    Patient Stated Goals walk without walker, walk normal, better balance.    Currently in Pain? No/denies            Ther-ex Octane fitness x 5 mins  BERG performed, pt performed 53/56, most challenged by tandem stance and single leg stance  MMT R/L 5/5 Hip flexion 4+/4 Hip ER 4+/4+ Hip IR 5/5 Knee extension 5/5 Knee flexion 5/5 Ankle DF 5/5 Ankle PF 4+/4+ Ankle inversion 4+/4+ Ankle eversion  6MWT: Pt able to ambulate 1235ft, carried SPC. Only mildly winded after.   Access Code: [email or web address] URL: [email or web address] Date: 12/08/2019 Prepared by: Lieutenant Diego   PT and pt talked through updated HEP, pt verbalized and demonstrated understanding Exercises Single Leg Stance  Standing Tandem Balance with Counter Support  Seated Ankle Alphabet  Mini Squat with Counter Support  Heel rises with counter support  Seated Long Arc Quad  Seated Toe Taps  Step Up with rail support    Pt response/clinical impression: the patient reported that he would like to discharge from therapy. Goals reassessed to check progress.Overall the patient demonstrated improved BERG score, and 6 MWT within functional limits for age group (~1273ft). The patient did still exhibit gait abnormalities due to LLE with time and fatigue, as well as balance deficits with higher level balance activities. The patient was agreeable with a few more PT visits to maximize endurance, balance, and return to PLOF. HEP updated and pt verbalized understanding.      [facility] PT Assessment - 12/08/19 0001      Berg Balance Test   Sit to Stand Able to stand without using hands and stabilize independently    Standing Unsupported Able to stand safely 2 minutes    Sitting with Back Unsupported but Feet Supported on Floor or Stool Able to sit safely and securely 2 minutes    Stand to Sit Sits safely with minimal use of hands    Transfers Able to transfer safely,  minor use of hands    Standing Unsupported with Eyes Closed Able to stand 10 seconds safely    Standing Unsupported with Feet Together Able to place feet together independently and stand 1 minute safely    From Standing, Reach Forward with Outstretched Arm Can reach forward >12 cm safely (5")    From Standing Position, Pick up Object from Floor Able to pick up shoe safely and easily    From Standing Position, Turn to Look Behind Over each Shoulder Looks behind from both sides and weight shifts well    Turn 360 Degrees Able to turn 360 degrees safely in 4 seconds or less    Standing Unsupported, Alternately Place Feet on Step/Stool Able to stand independently and safely and complete 8 steps in 20 seconds    Standing Unsupported, One Foot in Front Able to plae foot ahead  of the other independently and hold 30 seconds    Standing on One Leg Able to lift leg independently and hold 5-10 seconds    Total Score 53              PT Education - 12/08/19 1306    Education Details POC, therapeutic exercise form    Person(s) Educated Patient    Methods Explanation;Demonstration;Tactile cues    Comprehension Verbalized understanding;Returned demonstration;Need further instruction            PT Short Term Goals - 11/12/19 1245      PT SHORT TERM GOAL #1   Title Patient will be independent in home exercise program to improve strength/mobility for better functional independence with ADLs.    Baseline 7/15: HEP given    Time 4    Period Weeks    Status New    Target Date 12/10/19             PT Long Term Goals - 11/12/19 1245      PT LONG TERM GOAL #1   Title Patient will increase FOTO score to equal to or greater than     to demonstrate statistically significant improvement in mobility and quality of life.    Baseline 7/15: FOTO to do next session    Time 8    Period Weeks    Status New    Target Date 01/07/20      PT LONG TERM GOAL #2   Title Patient will demonstrate an improved  Berg Balance Score of >46/56  as to demonstrate improved balance with ADLs such as sitting/standing and transfer balance and reduced fall risk.    Baseline 7/15: 40/56    Time 8    Period Weeks    Status New    Target Date 01/07/20      PT LONG TERM GOAL #3   Title Patient will increase ABC scale score >80% to demonstrate better functional mobility and better confidence with ADLs.    Baseline 7/15: 64.3%    Time 8    Period Weeks    Status New    Target Date 01/07/20      PT LONG TERM GOAL #4   Title Patient will increase BLE gross strength to 4+/5 as to improve functional strength for independent gait, increased standing tolerance and increased ADL ability.    Baseline 7/15: see note    Time 8    Period Weeks    Status New    Target Date 01/07/20                 Plan - 12/08/19 1507    Clinical Impression Statement The patient reported that he would like to discharge from therapy. Goals reassessed to check progress.Overall the patient demonstrated improved BERG score, and 6 MWT within functional limits for age group (~1245ft). The patient did still exhibit gait abnormalities due to LLE with time and fatigue, as well as balance deficits with higher level balance activities. The patient was agreeable with a few more PT visits to maximize endurance, balance, and return to PLOF. HEP updated and pt verbalized understanding.    Personal Factors and Comorbidities Comorbidity 3+;Time since onset of injury/illness/exacerbation;Transportation;Past/Current Experience    Comorbidities GAD, DM, leukocytosis, colon cancer, CAD, HTN, NSTMI, ventricular tachycardia    Examination-Activity Limitations Caring for [employer];Locomotion Level;Stand;Toileting;Transfers    Examination-Participation Restrictions Church;Cleaning;Community Activity;Driving;Laundry;Volunteer;Shop;Meal Prep;Yard Work    Tax adviser  Fair    PT Frequency 2x / week    PT Duration 8 weeks    PT Treatment/Interventions ADLs/Self Care Home Management;Aquatic Therapy;Biofeedback;Canalith Repostioning;Cryotherapy;Moist Heat;Traction;DME Instruction;Gait training;Stair training;Functional mobility training;Therapeutic activities;Patient/family education;Neuromuscular re-education;Balance training;Therapeutic exercise;Manual techniques;Energy conservation;Dry needling;Passive range of motion;Taping;Vasopneumatic Device;Vestibular    PT Next Visit Plan dynamic balance and coordination exercises in prep for HEP to manage condition    PT Home Exercise Plan see above    Consulted and Agree with Plan of Care Patient;Family member/caregiver           Patient will benefit from skilled therapeutic intervention in order to improve the following deficits and impairments:  Abnormal gait, Cardiopulmonary status limiting activity, Decreased activity tolerance, Decreased balance, Decreased knowledge of precautions, Decreased endurance, Decreased coordination, Decreased mobility, Difficulty walking, Decreased strength, Impaired UE functional use, Postural dysfunction, Improper body mechanics  Visit Diagnosis: Other lack of coordination  Muscle weakness (generalized)  Unsteadiness on feet  Other abnormalities of gait and mobility  Acute ischemic right PCA stroke [facility])     Problem List Patient Active Problem List   Diagnosis Date Noted  . Iron deficiency anemia due to chronic blood loss 11/18/2019  . 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 ([place]) 11/17/2019  . Generalized anxiety disorder 10/26/2019  . Diabetes mellitus ([place]) 10/26/2019  . Acute ischemic right PCA stroke ([place]) 10/15/2019  . Ischemic stroke ([place]) 10/08/2019  . Hypokalemia   . Change in bowel habits   . Rectal bleeding   . Polyp of descending colon   . Diverticulosis of colon without hemorrhage   . Gastrointestinal hemorrhage  08/29/2019  . Coronary artery disease 08/29/2019  . Ischemic cardiomyopathy 08/29/2019  . Paroxysmal atrial fibrillation ([facility])   . Essential hypertension   . Hyperlipidemia LDL goal <70   . Non-ST elevation (NSTEMI) myocardial infarction ([place])   . Ventricular tachycardia ([place]) 06/04/2019    Lieutenant Diego PT, DPT 3:24 PM,12/08/19   [place] MAIN [facility] SERVICES [address] [place], [place], [address] Phone: [phone]   Fax:  [phone]  Name: Raequon Catanzaro MRN: [id number] Date of Birth: 1955-08-02

## 2019-12-09 ENCOUNTER — Encounter: Payer: Self-pay | Admitting: *Deleted

## 2019-12-09 ENCOUNTER — Inpatient Hospital Stay: Payer: Medicare HMO

## 2019-12-09 ENCOUNTER — Encounter: Payer: Self-pay | Admitting: Hematology & Oncology

## 2019-12-09 ENCOUNTER — Inpatient Hospital Stay: Payer: Medicare HMO | Attending: Hematology & Oncology

## 2019-12-09 ENCOUNTER — Encounter: Payer: Medicare HMO | Admitting: Occupational Therapy

## 2019-12-09 ENCOUNTER — Other Ambulatory Visit: Payer: Self-pay

## 2019-12-09 ENCOUNTER — Inpatient Hospital Stay (HOSPITAL_BASED_OUTPATIENT_CLINIC_OR_DEPARTMENT_OTHER): Payer: Medicare HMO | Admitting: Hematology & Oncology

## 2019-12-09 ENCOUNTER — Ambulatory Visit: Payer: Medicare HMO

## 2019-12-09 VITALS — BP 119/71 | HR 44 | Temp 98.2°F | Resp 18 | Ht 70.0 in | Wt 205.0 lb

## 2019-12-09 DIAGNOSIS — C775 Secondary and unspecified malignant neoplasm of intrapelvic lymph nodes: Secondary | ICD-10-CM | POA: Diagnosis not present

## 2019-12-09 DIAGNOSIS — D649 Anemia, unspecified: Secondary | ICD-10-CM | POA: Diagnosis not present

## 2019-12-09 DIAGNOSIS — C20 Malignant neoplasm of rectum: Secondary | ICD-10-CM | POA: Insufficient documentation

## 2019-12-09 DIAGNOSIS — I4891 Unspecified atrial fibrillation: Secondary | ICD-10-CM | POA: Insufficient documentation

## 2019-12-09 DIAGNOSIS — I639 Cerebral infarction, unspecified: Secondary | ICD-10-CM | POA: Diagnosis not present

## 2019-12-09 DIAGNOSIS — Z5111 Encounter for antineoplastic chemotherapy: Secondary | ICD-10-CM | POA: Diagnosis not present

## 2019-12-09 DIAGNOSIS — Z452 Encounter for adjustment and management of vascular access device: Secondary | ICD-10-CM | POA: Diagnosis not present

## 2019-12-09 DIAGNOSIS — D500 Iron deficiency anemia secondary to blood loss (chronic): Secondary | ICD-10-CM

## 2019-12-09 LAB — CBC WITH DIFFERENTIAL (CANCER CENTER ONLY)
Abs Immature Granulocytes: 0.02 10*3/uL (ref 0.00–0.07)
Basophils Absolute: 0 10*3/uL (ref 0.0–0.1)
Basophils Relative: 1 %
Eosinophils Absolute: 0.1 10*3/uL (ref 0.0–0.5)
Eosinophils Relative: 2 %
HCT: 31.6 % — ABNORMAL LOW (ref 39.0–52.0)
Hemoglobin: 10.4 g/dL — ABNORMAL LOW (ref 13.0–17.0)
Immature Granulocytes: 0 %
Lymphocytes Relative: 24 %
Lymphs Abs: 1.3 10*3/uL (ref 0.7–4.0)
MCH: 29.5 pg (ref 26.0–34.0)
MCHC: 32.9 g/dL (ref 30.0–36.0)
MCV: 89.8 fL (ref 80.0–100.0)
Monocytes Absolute: 0.4 10*3/uL (ref 0.1–1.0)
Monocytes Relative: 7 %
Neutro Abs: 3.6 10*3/uL (ref 1.7–7.7)
Neutrophils Relative %: 66 %
Platelet Count: 186 10*3/uL (ref 150–400)
RBC: 3.52 MIL/uL — ABNORMAL LOW (ref 4.22–5.81)
RDW: 19.5 % — ABNORMAL HIGH (ref 11.5–15.5)
WBC Count: 5.4 10*3/uL (ref 4.0–10.5)
nRBC: 0 % (ref 0.0–0.2)

## 2019-12-09 LAB — COMPREHENSIVE METABOLIC PANEL
ALT: 12 U/L (ref 0–44)
AST: 12 U/L — ABNORMAL LOW (ref 15–41)
Albumin: 4 g/dL (ref 3.5–5.0)
Alkaline Phosphatase: 49 U/L (ref 38–126)
Anion gap: 8 (ref 5–15)
BUN: 21 mg/dL (ref 8–23)
CO2: 29 mmol/L (ref 22–32)
Calcium: 9.2 mg/dL (ref 8.9–10.3)
Chloride: 107 mmol/L (ref 98–111)
Creatinine, Ser: 1.11 mg/dL (ref 0.61–1.24)
GFR calc Af Amer: >60 mL/min (ref >60)
GFR calc non Af Amer: >60 mL/min (ref >60)
Glucose, Bld: 143 mg/dL — ABNORMAL HIGH (ref 70–99)
Potassium: 3.1 mmol/L — ABNORMAL LOW (ref 3.5–5.1)
Sodium: 144 mmol/L (ref 135–145)
Total Bilirubin: 0.8 mg/dL (ref 0.3–1.2)
Total Protein: 5.9 g/dL — ABNORMAL LOW (ref 6.5–8.1)

## 2019-12-09 MED ORDER — PALONOSETRON HCL INJECTION 0.25 MG/5ML
0.2500 mg | Freq: Once | INTRAVENOUS | Status: AC
  Administered 2019-12-09: 0.25 mg via INTRAVENOUS

## 2019-12-09 MED ORDER — OXALIPLATIN CHEMO INJECTION 100 MG/20ML
85.0000 mg/m2 | Freq: Once | INTRAVENOUS | Status: AC
  Administered 2019-12-09: 185 mg via INTRAVENOUS
  Filled 2019-12-09: qty 37

## 2019-12-09 MED ORDER — DEXTROSE 5 % IV SOLN
Freq: Once | INTRAVENOUS | Status: AC
  Filled 2019-12-09: qty 250

## 2019-12-09 MED ORDER — SODIUM CHLORIDE 0.9% FLUSH
10.0000 mL | INTRAVENOUS | Status: DC | PRN
  Filled 2019-12-09: qty 10

## 2019-12-09 MED ORDER — LEUCOVORIN CALCIUM INJECTION 350 MG
400.0000 mg/m2 | Freq: Once | INTRAVENOUS | Status: AC
  Administered 2019-12-09: 864 mg via INTRAVENOUS
  Filled 2019-12-09: qty 43.2

## 2019-12-09 MED ORDER — SODIUM CHLORIDE 0.9 % IV SOLN
1920.0000 mg/m2 | INTRAVENOUS | Status: DC
  Administered 2019-12-09: 4150 mg via INTRAVENOUS
  Filled 2019-12-09: qty 83

## 2019-12-09 MED ORDER — PALONOSETRON HCL INJECTION 0.25 MG/5ML
INTRAVENOUS | Status: AC
  Filled 2019-12-09: qty 5

## 2019-12-09 MED ORDER — SODIUM CHLORIDE 0.9 % IV SOLN
10.0000 mg | Freq: Once | INTRAVENOUS | Status: AC
  Administered 2019-12-09: 10 mg via INTRAVENOUS
  Filled 2019-12-09: qty 10

## 2019-12-09 MED ORDER — HEPARIN SOD (PORK) LOCK FLUSH 100 UNIT/ML IV SOLN
500.0000 [IU] | Freq: Once | INTRAVENOUS | Status: DC | PRN
  Filled 2019-12-09: qty 5

## 2019-12-09 MED ORDER — FLUOROURACIL CHEMO INJECTION 2.5 GM/50ML
400.0000 mg/m2 | Freq: Once | INTRAVENOUS | Status: AC
  Administered 2019-12-09: 850 mg via INTRAVENOUS
  Filled 2019-12-09: qty 17

## 2019-12-09 NOTE — Progress Notes (Signed)
Hematology and Oncology Follow Up Visit  John Douglas [id number] 1955/07/22 64 y.o. 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 daily  Aspirin 81 mg p.o. daily  FOLFOX --  S/p cycle #1 -- started on 11/17/2019     Interim History:  John Douglas is in for follow-up.  He has for cycle of FOLFOX and did well with this.  He had no side effects from this.  He is found were down at the beach.  They just got back.  They had a really good time.  While he was down there he absolutely had no issues.  He is done incredibly well with physical therapy and Occupational Therapy.  Occupational Therapy does not even need to see him any longer.  He has had no problems with bowels or bladder.  He has had no mouth sores.  He has had no issues with nausea or vomiting.  The issues with his CVA are resolving.  He is using a walking cane.  However, he seems to be much more stable on the right side.  Overall, his performance status is ECOG 1. Atrial fibrillation  Medications:  Current Outpatient Medications:  .  acetaminophen (TYLENOL) 325 MG tablet, Take 1-2 tablets (325-650 mg total) by mouth every 4 (four) hours as needed for mild pain., Disp: , Rfl:  .  clonazePAM (KLONOPIN) 1 MG disintegrating tablet, Take 1 tablet (1 mg total) by mouth 2 (two) times daily as needed., Disp: 30 tablet, Rfl: 0 .  clopidogrel (PLAVIX) 75 MG tablet, Take 1 tablet (75 mg total) by mouth daily., Disp: 30 tablet, Rfl: 0 .  diltiazem (CARDIZEM CD) 180 MG 24 hr capsule, Take 1 capsule (180 mg total) by mouth daily., Disp: 30 capsule, Rfl: 0 .  ezetimibe (ZETIA) 10 MG tablet, Take 10 mg by mouth at bedtime., Disp: , Rfl:  .  fluticasone (FLONASE) 50 MCG/ACT nasal spray, Place 1-2 sprays into both nostrils as needed for allergies or rhinitis (or seasonal  allergies). , Disp: , Rfl:  .  gabapentin (NEURONTIN) 100 MG capsule, TAKE 1 CAPSULE BY MOUTH AT BEDTIME., Disp: 30 capsule, Rfl: 3 .  glimepiride (AMARYL) 2 MG tablet, Take 1 tablet (2 mg total) by mouth daily with breakfast., Disp: 30 tablet, Rfl: 0 .  glucose blood test strip, Monitor BS before meals and at bedtime for a couple of weeks. Then may decrease to bid before meals, Disp: 100 each, Rfl: 12 .  Lancets (ONETOUCH ULTRASOFT) lancets, Use as instructed, Disp: 100 each, Rfl: 12 .  lidocaine-prilocaine (EMLA) cream, Apply to affected area once, Disp: 30 g, Rfl: 3 .  LORazepam (ATIVAN) 0.5 MG tablet, Take 1 tablet (0.5 mg total) by mouth every 6 (six) hours as needed (Nausea or vomiting)., Disp: 30 tablet, Rfl: 0 .  metoprolol succinate (TOPROL XL) 100 MG 24 hr tablet, Take 1 tablet (100 mg total) by mouth in the morning and at bedtime., Disp: 60 tablet, Rfl: 3 .  nitroGLYCERIN (NITROSTAT) 0.4 MG SL tablet, Place 0.4 mg under the tongue every 5 (five) minutes as needed for chest pain. , Disp: , Rfl:  .  ondansetron (ZOFRAN) 8 MG tablet, Take 1 tablet (8 mg total) by mouth 2 (two) times daily as needed for refractory nausea / vomiting. Start on day 3 after chemotherapy., Disp: 30 tablet, Rfl:  1 .  prochlorperazine (COMPAZINE) 10 MG tablet, Take 1 tablet (10 mg total) by mouth every 6 (six) hours as needed (Nausea or vomiting)., Disp: 30 tablet, Rfl: 1 .  rosuvastatin (CRESTOR) 20 MG tablet, Take 1 tablet (20 mg total) by mouth daily., Disp: 30 tablet, Rfl: 0 .  sertraline (ZOLOFT) 50 MG tablet, Take 50 mg by mouth daily., Disp: , Rfl:  .  traZODone (DESYREL) 50 MG tablet, TAKE 1 TABLET (50 MG TOTAL) BY MOUTH AT BEDTIME AS NEEDED FOR SLEEP., Disp: 30 tablet, Rfl: 3 No current facility-administered medications for this visit.  Facility-Administered Medications Ordered in Other Visits:  .  fluorouracil (ADRUCIL) 4,150 mg in sodium chloride 0.9 % 67 mL chemo infusion, 1,920 mg/m2 (Treatment Plan  Recorded), Intravenous, 1 day or 1 dose, Adair Lemar, Rudell Cobb, MD .  fluorouracil (ADRUCIL) chemo injection 850 mg, 400 mg/m2 (Treatment Plan Recorded), Intravenous, Once, Ledarrius Beauchaine R, MD .  heparin lock flush 100 unit/mL, 500 Units, Intracatheter, Once PRN, Volanda Napoleon, MD .  leucovorin 864 mg in dextrose 5 % 250 mL infusion, 400 mg/m2 (Treatment Plan Recorded), Intravenous, Once, Volanda Napoleon, MD, Last Rate: 147 mL/hr at 12/09/19 1014, 864 mg at 12/09/19 1014 .  oxaliplatin (ELOXATIN) 185 mg in dextrose 5 % 500 mL chemo infusion, 85 mg/m2 (Treatment Plan Recorded), Intravenous, Once, Volanda Napoleon, MD, Last Rate: 269 mL/hr at 12/09/19 1016, 185 mg at 12/09/19 1016 .  sodium chloride flush (NS) 0.9 % injection 10 mL, 10 mL, Intracatheter, PRN, Volanda Napoleon, MD  Allergies:  Allergies  Allergen Reactions  . Ibuprofen Other (See Comments)    Was told to not take this   . Metformin And Related Other (See Comments)    Bloody stools   . Naproxen Other (See Comments)    Was told to not take this   . Yellow [place]et Venom [Bee Venom] Swelling and Other (See Comments)    Severe swelling where stung  . Penicillins Hives    Did it involve swelling of the face/tongue/throat, SOB, or low BP? Unk Did it involve sudden or severe rash/hives, skin peeling, or any reaction on the inside of your mouth or nose? Yes Did you need to seek medical attention at a hospital or doctor's office? Unk When did it last happen?"Childhood- 55 years ago" If all above answers are "NO", may proceed with cephalosporin use.     Past Medical History, Surgical history, Social history, and Family History were reviewed and updated.  Review of Systems: Review of Systems  Constitutional: Negative.   HENT:  Negative.   Eyes: Negative.   Respiratory: Negative.   Cardiovascular: Positive for palpitations.  Gastrointestinal: Positive for blood in stool.  Endocrine: Negative.   Genitourinary: Negative.      Skin: Negative.   Neurological: Positive for extremity weakness.  Hematological: Negative.   Psychiatric/Behavioral: Negative.     Physical Exam:  height is '5\' 10"'$  (1.778 m) and weight is 205 lb (93 kg). His oral temperature is 98.2 F (36.8 C). His blood pressure is 119/71 and his pulse is 44 (abnormal). His respiration is 18 and oxygen saturation is 100%.   Wt Readings from Last 3 Encounters:  12/09/19 205 lb (93 kg)  11/27/19 (!) 207 lb 8 oz (94.1 kg)  11/24/19 (!) 209 lb (94.8 kg)    Physical Exam Vitals reviewed.  HENT:     Head: Normocephalic and atraumatic.  Eyes:     Pupils: Pupils are equal, round, and  reactive to light.  Cardiovascular:     Rate and Rhythm: Normal rate and regular rhythm.     Heart sounds: Normal heart sounds.     Comments: Cardiac exam shows a tachycardic rate.  The rate is somewhat regular.  There may be occasional extra beat.  There are no murmurs, rubs or bruits. Pulmonary:     Effort: Pulmonary effort is normal.     Breath sounds: Normal breath sounds.  Abdominal:     General: Bowel sounds are normal.     Palpations: Abdomen is soft.     Comments: Abdominal exam is soft.  He has decent bowel sounds.  There is no guarding or rebound tenderness.  There is no fluid wave.  There is no palpable liver or spleen tip.  Musculoskeletal:        General: No tenderness or deformity. Normal range of motion.     Cervical back: Normal range of motion.  Lymphadenopathy:     Cervical: No cervical adenopathy.  Skin:    General: Skin is warm and dry.     Findings: No erythema or rash.  Neurological:     Mental Status: He is alert and oriented to person, place, and time.  Psychiatric:        Behavior: Behavior normal.        Thought Content: Thought content normal.        Judgment: Judgment normal.      Lab Results  Component Value Date   WBC 5.4 12/09/2019   HGB 10.4 (L) 12/09/2019   HCT 31.6 (L) 12/09/2019   MCV 89.8 12/09/2019   PLT 186  12/09/2019     Chemistry      Component Value Date/Time   NA 144 12/09/2019 0800   NA 140 08/28/2019 0940   K 3.1 (L) 12/09/2019 0800   CL 107 12/09/2019 0800   CO2 29 12/09/2019 0800   BUN 21 12/09/2019 0800   BUN 21 08/28/2019 0940   CREATININE 1.11 12/09/2019 0800   CREATININE 1.03 11/25/2019 0840      Component Value Date/Time   CALCIUM 9.2 12/09/2019 0800   ALKPHOS 49 12/09/2019 0800   AST 12 (L) 12/09/2019 0800   AST 9 (L) 11/25/2019 0840   ALT 12 12/09/2019 0800   ALT 13 11/25/2019 0840   BILITOT 0.8 12/09/2019 0800   BILITOT 1.0 11/25/2019 0840      Impression and Plan: Mr. Colegrove is a very nice 64 year old white male.  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  He had a rectal bleeding.  He subsequently was found to have rectal cancer.  He had adenopathy that was distant to the rectum.  We will proceed with his second cycle of chemotherapy.  We will plan for 4 cycles and then we will repeat his scans.  We will check his CEA level.  This should give [place] a decent idea as to how he is doing.    Volanda Napoleon, MD 8/11/202110:31 AM

## 2019-12-09 NOTE — Progress Notes (Signed)
Oncology Nurse Navigator Documentation  Oncology Nurse Navigator Flowsheets 12/09/2019  Abnormal Finding Date -  Confirmed Diagnosis Date -  Planned Course of Treatment -  Phase of Treatment -  Chemotherapy Actual Start Date: -  Navigator Follow Up Date: 12/22/2019  Navigator Follow Up Reason: Follow-up Appointment;Chemotherapy  Navigator Location [facility]  Navigator Encounter Type Treatment  Treatment Initiated Date 11/25/2019  Patient Visit Type MedOnc  Treatment Phase Active Tx  Barriers/Navigation Needs Coordination of Care;Education  Education Pain/ Symptom Management  Interventions Psycho-Social Support  Acuity Level 2-Minimal Needs (1-2 Barriers Identified)  Referrals -  Coordination of Care -  Education Method Verbal  Support Groups/Services Friends and Family  Time Spent with Patient 30

## 2019-12-09 NOTE — Patient Instructions (Signed)
[facility] Discharge Instructions for Patients Receiving Chemotherapy  Today you received the following chemotherapy agents 5FU, Oxaliplatin  To help prevent nausea and vomiting after your treatment, we encourage you to take your nausea medication    If you develop nausea and vomiting that is not controlled by your nausea medication, call the clinic.   BELOW ARE SYMPTOMS THAT SHOULD BE REPORTED IMMEDIATELY:  *FEVER GREATER THAN 100.5 F  *CHILLS WITH OR WITHOUT FEVER  NAUSEA AND VOMITING THAT IS NOT CONTROLLED WITH YOUR NAUSEA MEDICATION  *UNUSUAL SHORTNESS OF BREATH  *UNUSUAL BRUISING OR BLEEDING  TENDERNESS IN MOUTH AND THROAT WITH OR WITHOUT PRESENCE OF ULCERS  *URINARY PROBLEMS  *BOWEL PROBLEMS  UNUSUAL RASH Items with * indicate a potential emergency and should be followed up as soon as possible.  Feel free to call the clinic should you have any questions or concerns. The clinic phone number is [phone].  Please show the CHEMO ALERT CARD at check-in to the Emergency Department and triage nurse.   

## 2019-12-09 NOTE — Patient Instructions (Signed)
Implanted Port Insertion, Care After °This sheet gives you information about how to care for yourself after your procedure. Your health care provider may also give you more specific instructions. If you have problems or questions, contact your health care provider. °What can I expect after the procedure? °After the procedure, it is common to have: °· Discomfort at the port insertion site. °· Bruising on the skin over the port. This should improve over 3-4 days. °Follow these instructions at home: °Port care °· After your port is placed, you will get a manufacturer's information card. The card has information about your port. Keep this card with you at all times. °· Take care of the port as told by your health care provider. Ask your health care provider if you or a family member can get training for taking care of the port at home. A home health care nurse may also take care of the port. °· Make sure to remember what type of port you have. °Incision care ° °  ° °· Follow instructions from your health care provider about how to take care of your port insertion site. Make sure you: °? Wash your hands with soap and water before and after you change your bandage (dressing). If soap and water are not available, use hand sanitizer. °? Change your dressing as told by your health care provider. °? Leave stitches (sutures), skin glue, or adhesive strips in place. These skin closures may need to stay in place for 2 weeks or longer. If adhesive strip edges start to loosen and curl up, you may trim the loose edges. Do not remove adhesive strips completely unless your health care provider tells you to do that. °· Check your port insertion site every day for signs of infection. Check for: °? Redness, swelling, or pain. °? Fluid or blood. °? Warmth. °? Pus or a bad smell. °Activity °· Return to your normal activities as told by your health care provider. Ask your health care provider what activities are safe for you. °· Do not  lift anything that is heavier than 10 lb (4.5 kg), or the limit that you are told, until your health care provider says that it is safe. °General instructions °· Take over-the-counter and prescription medicines only as told by your health care provider. °· Do not take baths, swim, or use a hot tub until your health care provider approves. Ask your health care provider if you may take showers. You may only be allowed to take sponge baths. °· Do not drive for 24 hours if you were given a sedative during your procedure. °· Wear a medical alert bracelet in case of an emergency. This will tell any health care providers that you have a port. °· Keep all follow-up visits as told by your health care provider. This is important. °Contact a health care provider if: °· You cannot flush your port with saline as directed, or you cannot draw blood from the port. °· You have a fever or chills. °· You have redness, swelling, or pain around your port insertion site. °· You have fluid or blood coming from your port insertion site. °· Your port insertion site feels warm to the touch. °· You have pus or a bad smell coming from the port insertion site. °Get help right away if: °· You have chest pain or shortness of breath. °· You have bleeding from your port that you cannot control. °Summary °· Take care of the port as told by your health   care provider. Keep the manufacturer's information card with you at all times. °· Change your dressing as told by your health care provider. °· Contact a health care provider if you have a fever or chills or if you have redness, swelling, or pain around your port insertion site. °· Keep all follow-up visits as told by your health care provider. °This information is not intended to replace advice given to you by your health care provider. Make sure you discuss any questions you have with your health care provider. °Document Revised: 11/12/2017 Document Reviewed: 11/12/2017 °Elsevier Patient Education ©  2020 [employer] ° °

## 2019-12-10 ENCOUNTER — Other Ambulatory Visit: Payer: Self-pay | Admitting: Internal Medicine

## 2019-12-10 ENCOUNTER — Telehealth: Payer: Self-pay | Admitting: Hematology & Oncology

## 2019-12-10 DIAGNOSIS — I4891 Unspecified atrial fibrillation: Secondary | ICD-10-CM

## 2019-12-10 NOTE — Telephone Encounter (Signed)
Appointments scheduled calendar printed & mailed per 8/11 los

## 2019-12-11 ENCOUNTER — Inpatient Hospital Stay: Payer: Medicare HMO

## 2019-12-11 ENCOUNTER — Other Ambulatory Visit: Payer: Self-pay

## 2019-12-11 VITALS — BP 119/95 | HR 92 | Temp 98.5°F | Resp 17

## 2019-12-11 DIAGNOSIS — C20 Malignant neoplasm of rectum: Secondary | ICD-10-CM

## 2019-12-11 DIAGNOSIS — Z5111 Encounter for antineoplastic chemotherapy: Secondary | ICD-10-CM | POA: Diagnosis not present

## 2019-12-11 DIAGNOSIS — C775 Secondary and unspecified malignant neoplasm of intrapelvic lymph nodes: Secondary | ICD-10-CM

## 2019-12-11 MED ORDER — SODIUM CHLORIDE 0.9% FLUSH
10.0000 mL | INTRAVENOUS | Status: DC | PRN
  Administered 2019-12-11: 10 mL
  Filled 2019-12-11: qty 10

## 2019-12-11 MED ORDER — HEPARIN SOD (PORK) LOCK FLUSH 100 UNIT/ML IV SOLN
500.0000 [IU] | Freq: Once | INTRAVENOUS | Status: AC | PRN
  Administered 2019-12-11: 500 [IU]
  Filled 2019-12-11: qty 5

## 2019-12-11 NOTE — Patient Instructions (Signed)

## 2019-12-14 ENCOUNTER — Encounter: Payer: Medicare HMO | Admitting: Occupational Therapy

## 2019-12-14 ENCOUNTER — Ambulatory Visit: Payer: Medicare HMO

## 2019-12-14 ENCOUNTER — Other Ambulatory Visit: Payer: Self-pay | Admitting: Physical Medicine and Rehabilitation

## 2019-12-16 ENCOUNTER — Observation Stay (HOSPITAL_COMMUNITY): Payer: Medicare HMO

## 2019-12-16 ENCOUNTER — Inpatient Hospital Stay (HOSPITAL_COMMUNITY)
Admission: EM | Admit: 2019-12-16 | Discharge: 2019-12-19 | DRG: 872 | Disposition: A | Discharge status: Home / Self Care | Payer: Medicare HMO | Attending: Internal Medicine | Admitting: Internal Medicine

## 2019-12-16 ENCOUNTER — Ambulatory Visit: Payer: Medicare HMO | Admitting: Physical Therapy

## 2019-12-16 ENCOUNTER — Other Ambulatory Visit: Payer: Self-pay

## 2019-12-16 ENCOUNTER — Encounter: Payer: Medicare HMO | Admitting: Occupational Therapy

## 2019-12-16 ENCOUNTER — Emergency Department (HOSPITAL_COMMUNITY): Payer: Medicare HMO

## 2019-12-16 ENCOUNTER — Encounter (HOSPITAL_COMMUNITY): Payer: Self-pay | Admitting: Emergency Medicine

## 2019-12-16 DIAGNOSIS — E669 Obesity, unspecified: Secondary | ICD-10-CM | POA: Diagnosis present

## 2019-12-16 DIAGNOSIS — B9620 Unspecified Escherichia coli [E. coli] as the cause of diseases classified elsewhere: Secondary | ICD-10-CM | POA: Diagnosis present

## 2019-12-16 DIAGNOSIS — R7881 Bacteremia: Secondary | ICD-10-CM | POA: Diagnosis not present

## 2019-12-16 DIAGNOSIS — Z6829 Body mass index (BMI) 29.0-29.9, adult: Secondary | ICD-10-CM

## 2019-12-16 DIAGNOSIS — Z9109 Other allergy status, other than to drugs and biological substances: Secondary | ICD-10-CM

## 2019-12-16 DIAGNOSIS — Z880 Allergy status to penicillin: Secondary | ICD-10-CM

## 2019-12-16 DIAGNOSIS — Z95828 Presence of other vascular implants and grafts: Secondary | ICD-10-CM

## 2019-12-16 DIAGNOSIS — A4151 Sepsis due to Escherichia coli [E. coli]: Principal | ICD-10-CM | POA: Diagnosis present

## 2019-12-16 DIAGNOSIS — Z1623 Resistance to quinolones and fluoroquinolones: Secondary | ICD-10-CM | POA: Diagnosis present

## 2019-12-16 DIAGNOSIS — Z8042 Family history of malignant neoplasm of prostate: Secondary | ICD-10-CM

## 2019-12-16 DIAGNOSIS — Z91030 Bee allergy status: Secondary | ICD-10-CM

## 2019-12-16 DIAGNOSIS — Z7902 Long term (current) use of antithrombotics/antiplatelets: Secondary | ICD-10-CM

## 2019-12-16 DIAGNOSIS — E782 Mixed hyperlipidemia: Secondary | ICD-10-CM | POA: Diagnosis present

## 2019-12-16 DIAGNOSIS — E1140 Type 2 diabetes mellitus with diabetic neuropathy, unspecified: Secondary | ICD-10-CM | POA: Diagnosis present

## 2019-12-16 DIAGNOSIS — I480 Paroxysmal atrial fibrillation: Secondary | ICD-10-CM | POA: Diagnosis present

## 2019-12-16 DIAGNOSIS — Z886 Allergy status to analgesic agent status: Secondary | ICD-10-CM

## 2019-12-16 DIAGNOSIS — C19 Malignant neoplasm of rectosigmoid junction: Secondary | ICD-10-CM | POA: Diagnosis present

## 2019-12-16 DIAGNOSIS — Z7984 Long term (current) use of oral hypoglycemic drugs: Secondary | ICD-10-CM

## 2019-12-16 DIAGNOSIS — K5732 Diverticulitis of large intestine without perforation or abscess without bleeding: Secondary | ICD-10-CM | POA: Diagnosis present

## 2019-12-16 DIAGNOSIS — D701 Agranulocytosis secondary to cancer chemotherapy: Secondary | ICD-10-CM

## 2019-12-16 DIAGNOSIS — I69351 Hemiplegia and hemiparesis following cerebral infarction affecting right dominant side: Secondary | ICD-10-CM

## 2019-12-16 DIAGNOSIS — F419 Anxiety disorder, unspecified: Secondary | ICD-10-CM | POA: Diagnosis present

## 2019-12-16 DIAGNOSIS — Z79899 Other long term (current) drug therapy: Secondary | ICD-10-CM

## 2019-12-16 DIAGNOSIS — C775 Secondary and unspecified malignant neoplasm of intrapelvic lymph nodes: Secondary | ICD-10-CM | POA: Diagnosis present

## 2019-12-16 DIAGNOSIS — Z87891 Personal history of nicotine dependence: Secondary | ICD-10-CM

## 2019-12-16 DIAGNOSIS — I2510 Atherosclerotic heart disease of native coronary artery without angina pectoris: Secondary | ICD-10-CM | POA: Diagnosis present

## 2019-12-16 DIAGNOSIS — K5792 Diverticulitis of intestine, part unspecified, without perforation or abscess without bleeding: Secondary | ICD-10-CM | POA: Diagnosis present

## 2019-12-16 DIAGNOSIS — I252 Old myocardial infarction: Secondary | ICD-10-CM

## 2019-12-16 DIAGNOSIS — Z888 Allergy status to other drugs, medicaments and biological substances status: Secondary | ICD-10-CM

## 2019-12-16 DIAGNOSIS — Z20822 Contact with and (suspected) exposure to covid-19: Secondary | ICD-10-CM | POA: Diagnosis present

## 2019-12-16 DIAGNOSIS — Z8249 Family history of ischemic heart disease and other diseases of the circulatory system: Secondary | ICD-10-CM

## 2019-12-16 DIAGNOSIS — R509 Fever, unspecified: Secondary | ICD-10-CM | POA: Diagnosis not present

## 2019-12-16 DIAGNOSIS — C20 Malignant neoplasm of rectum: Secondary | ICD-10-CM | POA: Diagnosis present

## 2019-12-16 DIAGNOSIS — I4891 Unspecified atrial fibrillation: Secondary | ICD-10-CM | POA: Diagnosis present

## 2019-12-16 DIAGNOSIS — I152 Hypertension secondary to endocrine disorders: Secondary | ICD-10-CM | POA: Diagnosis present

## 2019-12-16 DIAGNOSIS — T451X5A Adverse effect of antineoplastic and immunosuppressive drugs, initial encounter: Secondary | ICD-10-CM | POA: Diagnosis present

## 2019-12-16 DIAGNOSIS — I10 Essential (primary) hypertension: Secondary | ICD-10-CM | POA: Diagnosis present

## 2019-12-16 DIAGNOSIS — E1159 Type 2 diabetes mellitus with other circulatory complications: Secondary | ICD-10-CM | POA: Diagnosis present

## 2019-12-16 DIAGNOSIS — D500 Iron deficiency anemia secondary to blood loss (chronic): Secondary | ICD-10-CM

## 2019-12-16 DIAGNOSIS — E876 Hypokalemia: Secondary | ICD-10-CM | POA: Diagnosis present

## 2019-12-16 HISTORY — DX: Cerebral infarction, unspecified: I63.9

## 2019-12-16 HISTORY — DX: Cardiac arrhythmia, unspecified: I49.9

## 2019-12-16 LAB — COMPREHENSIVE METABOLIC PANEL
ALT: 18 U/L (ref 0–44)
AST: 18 U/L (ref 15–41)
Albumin: 3.6 g/dL (ref 3.5–5.0)
Alkaline Phosphatase: 45 U/L (ref 38–126)
Anion gap: 8 (ref 5–15)
BUN: 22 mg/dL (ref 8–23)
CO2: 24 mmol/L (ref 22–32)
Calcium: 8.7 mg/dL — ABNORMAL LOW (ref 8.9–10.3)
Chloride: 106 mmol/L (ref 98–111)
Creatinine, Ser: 1.25 mg/dL — ABNORMAL HIGH (ref 0.61–1.24)
GFR calc Af Amer: >60 mL/min (ref >60)
GFR calc non Af Amer: >60 mL/min (ref >60)
Glucose, Bld: 141 mg/dL — ABNORMAL HIGH (ref 70–99)
Potassium: 2.7 mmol/L — CL (ref 3.5–5.1)
Sodium: 138 mmol/L (ref 135–145)
Total Bilirubin: 1.3 mg/dL — ABNORMAL HIGH (ref 0.3–1.2)
Total Protein: 6 g/dL — ABNORMAL LOW (ref 6.5–8.1)

## 2019-12-16 LAB — RESPIRATORY PANEL BY PCR

## 2019-12-16 LAB — BLOOD CULTURE ID PANEL (REFLEXED) - BCID2

## 2019-12-16 LAB — CBC WITH DIFFERENTIAL/PLATELET
Abs Immature Granulocytes: 0.04 10*3/uL (ref 0.00–0.07)
Basophils Absolute: 0 10*3/uL (ref 0.0–0.1)
Basophils Relative: 0 %
Eosinophils Absolute: 0 10*3/uL (ref 0.0–0.5)
Eosinophils Relative: 0 %
HCT: 32.6 % — ABNORMAL LOW (ref 39.0–52.0)
Hemoglobin: 10.8 g/dL — ABNORMAL LOW (ref 13.0–17.0)
Immature Granulocytes: 2 %
Lymphocytes Relative: 9 %
Lymphs Abs: 0.2 10*3/uL — ABNORMAL LOW (ref 0.7–4.0)
MCH: 29.3 pg (ref 26.0–34.0)
MCHC: 33.1 g/dL (ref 30.0–36.0)
MCV: 88.6 fL (ref 80.0–100.0)
Monocytes Absolute: 0.1 10*3/uL (ref 0.1–1.0)
Monocytes Relative: 2 %
Neutro Abs: 2.2 10*3/uL (ref 1.7–7.7)
Neutrophils Relative %: 87 %
Platelets: 214 10*3/uL (ref 150–400)
RBC: 3.68 MIL/uL — ABNORMAL LOW (ref 4.22–5.81)
RDW: 18.9 % — ABNORMAL HIGH (ref 11.5–15.5)
WBC: 2.5 10*3/uL — ABNORMAL LOW (ref 4.0–10.5)
nRBC: 2 % — ABNORMAL HIGH (ref 0.0–0.2)

## 2019-12-16 LAB — HEPARIN LEVEL (UNFRACTIONATED): Heparin Unfractionated: <0.1 IU/mL — ABNORMAL LOW (ref 0.30–0.70)

## 2019-12-16 LAB — URINALYSIS, ROUTINE W REFLEX MICROSCOPIC
Bilirubin Urine: NEGATIVE
Glucose, UA: NEGATIVE mg/dL
Hgb urine dipstick: NEGATIVE
Ketones, ur: NEGATIVE mg/dL
Leukocytes,Ua: NEGATIVE
Nitrite: NEGATIVE
Protein, ur: NEGATIVE mg/dL
Specific Gravity, Urine: 1.02 (ref 1.005–1.030)
pH: 5 (ref 5.0–8.0)

## 2019-12-16 LAB — SARS CORONAVIRUS 2 BY RT PCR (HOSPITAL ORDER, PERFORMED IN ~~LOC~~ HOSPITAL LAB): SARS Coronavirus 2: NEGATIVE

## 2019-12-16 LAB — POTASSIUM: Potassium: 4.2 mmol/L (ref 3.5–5.1)

## 2019-12-16 LAB — MAGNESIUM
Magnesium: 1.8 mg/dL (ref 1.7–2.4)
Magnesium: 1.9 mg/dL (ref 1.7–2.4)

## 2019-12-16 LAB — PROTIME-INR
INR: 1.3 — ABNORMAL HIGH (ref 0.8–1.2)
Prothrombin Time: 15.8 seconds — ABNORMAL HIGH (ref 11.4–15.2)

## 2019-12-16 LAB — LACTIC ACID, PLASMA: Lactic Acid, Venous: 1.5 mmol/L (ref 0.5–1.9)

## 2019-12-16 LAB — GLUCOSE, CAPILLARY: Glucose-Capillary: 92 mg/dL (ref 70–99)

## 2019-12-16 MED ORDER — HEPARIN BOLUS VIA INFUSION
4000.0000 [IU] | Freq: Once | INTRAVENOUS | Status: DC
  Filled 2019-12-16: qty 4000

## 2019-12-16 MED ORDER — ACETAMINOPHEN 500 MG PO TABS
500.0000 mg | ORAL_TABLET | Freq: Once | ORAL | Status: AC
  Administered 2019-12-16: 500 mg via ORAL
  Filled 2019-12-16: qty 1

## 2019-12-16 MED ORDER — ALBUTEROL SULFATE (2.5 MG/3ML) 0.083% IN NEBU: 2.5000 mg | INHALATION_SOLUTION | RESPIRATORY_TRACT | Status: DC | PRN

## 2019-12-16 MED ORDER — INSULIN ASPART 100 UNIT/ML ~~LOC~~ SOLN: 0.0000 [IU] | Freq: Every day | SUBCUTANEOUS | Status: DC

## 2019-12-16 MED ORDER — INSULIN ASPART 100 UNIT/ML ~~LOC~~ SOLN
0.0000 [IU] | Freq: Three times a day (TID) | SUBCUTANEOUS | Status: DC
  Administered 2019-12-18: 2 [IU] via SUBCUTANEOUS
  Administered 2019-12-19: 1 [IU] via SUBCUTANEOUS

## 2019-12-16 MED ORDER — ONDANSETRON HCL 4 MG/2ML IJ SOLN: 4.0000 mg | Freq: Four times a day (QID) | INTRAMUSCULAR | Status: DC | PRN

## 2019-12-16 MED ORDER — TRAZODONE HCL 50 MG PO TABS
50.0000 mg | ORAL_TABLET | Freq: Every evening | ORAL | Status: DC | PRN
  Filled 2019-12-16: qty 1

## 2019-12-16 MED ORDER — LACTATED RINGERS IV BOLUS
1000.0000 mL | Freq: Once | INTRAVENOUS | Status: AC
  Administered 2019-12-16: 1000 mL via INTRAVENOUS

## 2019-12-16 MED ORDER — EZETIMIBE 10 MG PO TABS
10.0000 mg | ORAL_TABLET | Freq: Every day | ORAL | Status: DC
  Administered 2019-12-16 – 2019-12-18 (×3): 10 mg via ORAL
  Filled 2019-12-16 (×4): qty 1

## 2019-12-16 MED ORDER — HEPARIN (PORCINE) 25000 UT/250ML-% IV SOLN
2300.0000 [IU]/h | INTRAVENOUS | Status: DC
  Administered 2019-12-16: 1350 [IU]/h via INTRAVENOUS
  Administered 2019-12-16: 1650 [IU]/h via INTRAVENOUS
  Administered 2019-12-17: 1850 [IU]/h via INTRAVENOUS
  Administered 2019-12-18: 2300 [IU]/h via INTRAVENOUS
  Filled 2019-12-16 (×4): qty 250

## 2019-12-16 MED ORDER — SENNOSIDES-DOCUSATE SODIUM 8.6-50 MG PO TABS: 1.0000 | ORAL_TABLET | Freq: Every evening | ORAL | Status: DC | PRN

## 2019-12-16 MED ORDER — ACETAMINOPHEN 500 MG PO TABS
500.0000 mg | ORAL_TABLET | Freq: Four times a day (QID) | ORAL | Status: DC | PRN
  Administered 2019-12-16 – 2019-12-17 (×2): 500 mg via ORAL
  Filled 2019-12-16 (×2): qty 1

## 2019-12-16 MED ORDER — POTASSIUM CHLORIDE CRYS ER 20 MEQ PO TBCR
40.0000 meq | EXTENDED_RELEASE_TABLET | Freq: Once | ORAL | Status: AC
  Administered 2019-12-16: 40 meq via ORAL
  Filled 2019-12-16: qty 2

## 2019-12-16 MED ORDER — SERTRALINE HCL 50 MG PO TABS
50.0000 mg | ORAL_TABLET | Freq: Every day | ORAL | Status: DC
  Administered 2019-12-16 – 2019-12-19 (×4): 50 mg via ORAL
  Filled 2019-12-16 (×4): qty 1

## 2019-12-16 MED ORDER — SODIUM CHLORIDE 0.9 % IV SOLN
1.0000 g | Freq: Once | INTRAVENOUS | Status: AC
  Administered 2019-12-16: 1 g via INTRAVENOUS
  Filled 2019-12-16 (×2): qty 1

## 2019-12-16 MED ORDER — SODIUM CHLORIDE 0.9 % IV SOLN
1.0000 g | Freq: Three times a day (TID) | INTRAVENOUS | Status: DC
  Administered 2019-12-16: 1 g via INTRAVENOUS
  Filled 2019-12-16 (×3): qty 1

## 2019-12-16 MED ORDER — VANCOMYCIN HCL 2000 MG/400ML IV SOLN
2000.0000 mg | Freq: Once | INTRAVENOUS | Status: AC
  Administered 2019-12-16: 2000 mg via INTRAVENOUS
  Filled 2019-12-16: qty 400

## 2019-12-16 MED ORDER — HEPARIN BOLUS VIA INFUSION
2000.0000 [IU] | Freq: Once | INTRAVENOUS | Status: AC
  Administered 2019-12-16: 2000 [IU] via INTRAVENOUS
  Filled 2019-12-16: qty 2000

## 2019-12-16 MED ORDER — GLIMEPIRIDE 2 MG PO TABS
2.0000 mg | ORAL_TABLET | Freq: Every day | ORAL | Status: DC
  Administered 2019-12-16: 2 mg via ORAL
  Filled 2019-12-16: qty 1

## 2019-12-16 MED ORDER — DILTIAZEM HCL ER COATED BEADS 180 MG PO CP24
180.0000 mg | ORAL_CAPSULE | Freq: Every day | ORAL | Status: DC
  Administered 2019-12-16 – 2019-12-19 (×4): 180 mg via ORAL
  Filled 2019-12-16 (×4): qty 1

## 2019-12-16 MED ORDER — SODIUM CHLORIDE 0.9 % IV SOLN
2.0000 g | INTRAVENOUS | Status: DC
  Administered 2019-12-16 – 2019-12-17 (×2): 2 g via INTRAVENOUS
  Filled 2019-12-16: qty 20
  Filled 2019-12-16 (×2): qty 2

## 2019-12-16 MED ORDER — CLOPIDOGREL BISULFATE 75 MG PO TABS
75.0000 mg | ORAL_TABLET | Freq: Every day | ORAL | Status: DC
  Administered 2019-12-16 – 2019-12-19 (×4): 75 mg via ORAL
  Filled 2019-12-16 (×4): qty 1

## 2019-12-16 MED ORDER — ONDANSETRON HCL 4 MG PO TABS: 4.0000 mg | ORAL_TABLET | Freq: Four times a day (QID) | ORAL | Status: DC | PRN

## 2019-12-16 MED ORDER — VANCOMYCIN HCL IN DEXTROSE 1-5 GM/200ML-% IV SOLN
1000.0000 mg | Freq: Two times a day (BID) | INTRAVENOUS | Status: DC
  Filled 2019-12-16: qty 200

## 2019-12-16 MED ORDER — CHLORHEXIDINE GLUCONATE CLOTH 2 % EX PADS
6.0000 | MEDICATED_PAD | Freq: Every day | CUTANEOUS | Status: DC
  Administered 2019-12-17 – 2019-12-18 (×2): 6 via TOPICAL

## 2019-12-16 MED ORDER — IOHEXOL 350 MG/ML SOLN
84.0000 mL | Freq: Once | INTRAVENOUS | Status: AC | PRN
  Administered 2019-12-16: 84 mL via INTRAVENOUS

## 2019-12-16 MED ORDER — POTASSIUM CHLORIDE 10 MEQ/100ML IV SOLN
10.0000 meq | Freq: Once | INTRAVENOUS | Status: AC
  Administered 2019-12-16: 10 meq via INTRAVENOUS
  Filled 2019-12-16: qty 100

## 2019-12-16 MED ORDER — ROSUVASTATIN CALCIUM 20 MG PO TABS
20.0000 mg | ORAL_TABLET | Freq: Every day | ORAL | Status: DC
  Administered 2019-12-16 – 2019-12-18 (×3): 20 mg via ORAL
  Filled 2019-12-16 (×3): qty 1

## 2019-12-16 MED ORDER — METOPROLOL SUCCINATE ER 100 MG PO TB24
100.0000 mg | ORAL_TABLET | Freq: Every day | ORAL | Status: DC
  Administered 2019-12-16 – 2019-12-17 (×2): 100 mg via ORAL
  Filled 2019-12-16: qty 4
  Filled 2019-12-16: qty 1

## 2019-12-16 NOTE — ED Triage Notes (Signed)
BIB EMS from home. Patient reports fever, chills/body aches onset yesterday AM. Was instructed by oncologist to come in for eval. Patient currently receiving treatment for colon cancer.

## 2019-12-16 NOTE — Progress Notes (Signed)
Pharmacy Antibiotic Note  John Douglas is a 64 y.o. male with rectal CA undergoing chemotherapy admitted on 12/16/2019 with fever/chills.  Pharmacy has been consulted for Vancomycin and Meropenem dosing.  Plan: Vancomcyin 2 g IV now, then 1000 mg IV q12h Meropenem 1 g IV q8h  Height: 5\' 10"  (177.8 cm) Weight: 93 kg (205 lb 0.4 oz) IBW/kg (Calculated) : 73  Temp (24hrs), Avg:100.8 F (38.2 C), Min:98.4 F (36.9 C), Max:103.1 F (39.5 C)  Recent Labs  Lab 12/09/19 0800 12/16/19 0110  WBC 5.4 2.5*  CREATININE 1.11 1.25*  LATICACIDVEN  --  1.5    Estimated Creatinine Clearance: 67.5 mL/min (A) (by C-G formula based on SCr of 1.25 mg/dL (H)).    Allergies  Allergen Reactions  . Ibuprofen Other (See Comments)    Was told to not take this   . Metformin And Related Other (See Comments)    Bloody stools   . Naproxen Other (See Comments)    Was told to not take this   . Yellow Jacket Venom [Bee Venom] Swelling and Other (See Comments)    Severe swelling where stung  . Penicillins Hives    Did it involve swelling of the face/tongue/throat, SOB, or low BP? Unk Did it involve sudden or severe rash/hives, skin peeling, or any reaction on the inside of your mouth or nose? Yes Did you need to seek medical attention at a hospital or doctor's office? Unk When did it last happen?"Childhood- 55 years ago" If all above answers are "NO", may proceed with cephalosporin use.   . Celexa [Citalopram]     Contraindicated with A-Fib  .  John Douglas 12/16/2019 6:07 AM

## 2019-12-16 NOTE — Progress Notes (Addendum)
ANTICOAGULATION CONSULT NOTE - Initial Consult  Pharmacy Consult for Heparin Indication: atrial fibrillation  Allergies  Allergen Reactions  . Ibuprofen Other (See Comments)    Was told to not take this   . Metformin And Related Other (See Comments)    Bloody stools   . Naproxen Other (See Comments)    Was told to not take this   . Yellow Jacket Venom [Bee Venom] Swelling and Other (See Comments)    Severe swelling where stung  . Penicillins Hives    Did it involve swelling of the face/tongue/throat, SOB, or low BP? Unk Did it involve sudden or severe rash/hives, skin peeling, or any reaction on the inside of your mouth or nose? Yes Did you need to seek medical attention at a hospital or doctor's office? Unk When did it last happen?"Childhood- 55 years ago" If all above answers are "NO", may proceed with cephalosporin use.   . Celexa [Citalopram]     Contraindicated with A-Fib    Patient Measurements: Height: 5\' 10"  (177.8 cm) Weight: 93 kg (205 lb 0.4 oz) IBW/kg (Calculated) : 73 Heparin Dosing Weight: 90 kg  Vital Signs: Temp: 98.4 F (36.9 C) (08/18 0437) Temp Source: Oral (08/18 0057) BP: 92/57 (08/18 0558) Pulse Rate: 103 (08/18 0558)  Labs: Recent Labs    12/16/19 0110  HGB 10.8*  HCT 32.6*  PLT 214  LABPROT 15.8*  INR 1.3*  CREATININE 1.25*    Estimated Creatinine Clearance: 67.5 mL/min (A) (by C-G formula based on SCr of 1.25 mg/dL (H)).   Medical History: Past Medical History:  Diagnosis Date  . Allergic rhinitis    [facility]  . Anxiety   . Atrial fibrillation ([place])    [facility]  . Benign essential hypertension    [facility]  . Coronary artery disease    [facility]  . Diabetic neuropathy, type II diabetes mellitus ([place])    [facility]  . Dysrhythmia   . Iron deficiency anemia due to chronic blood loss 11/18/2019  . Mixed  hyperlipidemia    [facility]  . Myocardial infarct [facility])    [facility]  . Obesity, unspecified    [facility]  . Rectal bleeding     [facility]  . 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 Stroke ([place])   . Wide-complex tachycardia ([place]) 06/04/2019    Medications:  No current facility-administered medications on file prior to encounter.   Current Outpatient Medications on File Prior to Encounter  Medication Sig Dispense Refill  . acetaminophen (TYLENOL) 500 MG tablet Take 500 mg by mouth every 6 (six) hours as needed for fever.    . clonazePAM (KLONOPIN) 1 MG disintegrating tablet Take 1 tablet (1 mg total) by mouth 2 (two) times daily as needed. (Patient taking differently: Take 1 mg by mouth 2 (two) times daily as needed (for anxiety). ) 30 tablet 0  . clopidogrel (PLAVIX) 75 MG tablet Take 1 tablet (75 mg total) by mouth daily. 30 tablet 0  . diltiazem (CARDIZEM CD) 180 MG 24 hr capsule TAKE 1 CAPSULE BY MOUTH EVERY DAY (Patient taking differently: Take 180 mg by mouth daily. ) 30 capsule 1  . ezetimibe (ZETIA) 10 MG tablet Take 10 mg by mouth at bedtime.    . fluticasone (FLONASE) 50 MCG/ACT nasal spray Place 1-2 sprays into both nostrils as needed (for  seasonal allergies).     . gabapentin (NEURONTIN) 100 MG capsule TAKE 1 CAPSULE BY MOUTH AT BEDTIME. (Patient taking differently: Take 100 mg by mouth at bedtime. ) 30 capsule 3  . glimepiride (AMARYL) 2 MG tablet Take 1 tablet (2 mg total) by mouth daily with breakfast. 30 tablet 0  . metoprolol succinate (TOPROL XL) 100 MG 24 hr tablet Take 1 tablet (100 mg total) by mouth in the morning and at bedtime. 60 tablet 3  . rosuvastatin (CRESTOR) 20 MG tablet Take 1 tablet (20 mg total) by mouth daily. (Patient taking differently: Take 20 mg by mouth at bedtime. ) 30 tablet 0  . sertraline (ZOLOFT) 50 MG tablet Take 50 mg by mouth  daily.    . traZODone (DESYREL) 50 MG tablet TAKE 1 TABLET (50 MG TOTAL) BY MOUTH AT BEDTIME AS NEEDED FOR SLEEP. 30 tablet 3  . acetaminophen (TYLENOL) 325 MG tablet Take 1-2 tablets (325-650 mg total) by mouth every 4 (four) hours as needed for mild pain.    Marland Kitchen glucose blood test strip Monitor BS before meals and at bedtime for a couple of weeks. Then may decrease to bid before meals 100 each 12  . Lancets (ONETOUCH ULTRASOFT) lancets Use as instructed 100 each 12  . lidocaine-prilocaine (EMLA) cream Apply to affected area once (Patient not taking: Reported on 12/16/2019) 30 g 3  . LORazepam (ATIVAN) 0.5 MG tablet Take 1 tablet (0.5 mg total) by mouth every 6 (six) hours as needed (Nausea or vomiting). (Patient not taking: Reported on 12/16/2019) 30 tablet 0  . nitroGLYCERIN (NITROSTAT) 0.4 MG SL tablet Place 0.4 mg under the tongue every 5 (five) minutes as needed for chest pain.     Marland Kitchen ondansetron (ZOFRAN) 8 MG tablet Take 1 tablet (8 mg total) by mouth 2 (two) times daily as needed for refractory nausea / vomiting. Start on day 3 after chemotherapy. 30 tablet 1  . prochlorperazine (COMPAZINE) 10 MG tablet Take 1 tablet (10 mg total) by mouth every 6 (six) hours as needed (Nausea or vomiting). (Patient taking differently: Take 10 mg by mouth every 6 (six) hours as needed for nausea or vomiting. ) 30 tablet 1     Assessment: 64 y.o. male with Afib for heparin Goal of Therapy:  Heparin level 0.3-0.7 units/ml Monitor platelets by anticoagulation protocol: Yes   Plan:  Heparin 2000 units IV bolus, then start heparin 1350 units/hr Check heparin level in 6 hours.   [name] 12/16/2019,6:01 AM

## 2019-12-16 NOTE — H&P (Signed)
History and Physical    [name] DTO:[id number] DOB: February 17, 1956 DOA: 12/16/2019  PCP: Leonides Sake, MD   Patient coming from: Home  Chief Complaint: Fever and chills  HPI: John Douglas is a 64 y.o. male with medical history significant for rectal cancer recently started on chemotherapy, hypertension, CAD, previous MI,  diabetes mellitus, previous stroke who presents for evaluation of fever and chills.  He reports he went to dinner with some friends as and on the way home he developed chills and when he got home his temperature was 101 degrees.  His daughter was with him and became concerned and called her oncologist to advised him to come to the emergency room for evaluation.  When he arrived in the emergency room temperature was 103 degrees.  He continued to have chills but did not have any cough, chest pain, palpitations, nausea, vomiting, diarrhea, abdominal pain, urinary frequency or dysuria.  He has not had any rash.  He denies any known sick contacts.  He did have his COVID-19 vaccine in June.  Reports he has never had DVT or PE and has not had any prolonged immobilization or recent travel.   ED Course: Patient initially with a fever of 103 degrees.  Given Tylenol and fever has broken.  Noted to have decreased white blood cell count with normal neutrophil value.  Chest x-ray did not reveal any pneumonia.  Urinalysis is negative for infection.  No source of fever identified.  ER provider discussed with oncology who recommended patient stay for empiric antibiotics while cultures are pending.  Review of Systems:  General: Reports fever and chills.  Denies weakness, weight loss, night sweats.  Denies dizziness.  Denies change in appetite HENT: Denies head trauma, headache, denies change in hearing, tinnitus.  Denies nasal congestion or bleeding.  Denies sore throat, sores in mouth.  Denies difficulty swallowing Eyes: Denies blurry vision, pain in eye, drainage.  Denies discoloration of  eyes. Neck: Denies pain.  Denies swelling.  Denies pain with movement. Cardiovascular: Denies chest pain, palpitations.  Denies edema.  Denies orthopnea Respiratory: Denies shortness of breath, cough.  Denies wheezing.  Denies sputum production Gastrointestinal: Denies abdominal pain, swelling.  Denies nausea, vomiting, diarrhea.  Denies melena.  Denies hematemesis. Musculoskeletal: Denies limitation of movement.  Denies deformity or swelling.  Denies pain.  Denies arthralgias or myalgias. Genitourinary: Denies pelvic pain.  Denies urinary frequency or hesitancy.  Denies dysuria.  Skin: Denies rash.  Denies petechiae, purpura, ecchymosis. Neurological: Denies headache.  Denies syncope.  Denies seizure activity.  Denies weakness or paresthesia.  Denies slurred speech, drooping face.  Denies visual change. Psychiatric: Denies depression, anxiety.  Denies suicidal thoughts or ideation.  Denies hallucinations.  Past Medical History:  Diagnosis Date  . Allergic rhinitis    [facility]  . Anxiety   . Atrial fibrillation ([place])    [facility]  . Benign essential hypertension    [facility]  . Coronary artery disease    [facility]  . Diabetic neuropathy, type II diabetes mellitus ([place])    [facility]  . Dysrhythmia   . Iron deficiency anemia due to chronic blood loss 11/18/2019  . Mixed hyperlipidemia    [facility]  . Myocardial infarct [facility])    [facility]  . Obesity, unspecified    [facility]  . Rectal bleeding     [facility]  . 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 Stroke ([place])   . Wide-complex tachycardia ([place]) 06/04/2019    Past Surgical History:  Procedure Laterality Date  . BIOPSY  10/07/2019   Procedure: BIOPSY;  Surgeon: Lavena Bullion, DO;  Location: [facility]  ENDOSCOPY;  Service: Gastroenterology;;  . COLONOSCOPY WITH PROPOFOL N/A 10/07/2019   Procedure: COLONOSCOPY WITH PROPOFOL;  Surgeon: Lavena Bullion, DO;  Location: [facility] ENDOSCOPY;  Service: Gastroenterology;  Laterality: N/A;  . CORONARY ANGIOPLASTY     3 stents  . IR IMAGING GUIDED PORT INSERTION  11/24/2019  . LEFT HEART CATH AND CORONARY ANGIOGRAPHY N/A 06/05/2019   Procedure: LEFT HEART CATH AND CORONARY ANGIOGRAPHY;  Surgeon: Nelva Bush, MD;  Location: [facility];  Service: Cardiovascular;  Laterality: N/A;  . POLYPECTOMY  10/07/2019   Procedure: POLYPECTOMY;  Surgeon: Lavena Bullion, DO;  Location: [facility] ENDOSCOPY;  Service: Gastroenterology;;  . Lia Foyer TATTOO INJECTION  10/07/2019   Procedure: SUBMUCOSAL TATTOO INJECTION;  Surgeon: Lavena Bullion, DO;  Location: [facility] ENDOSCOPY;  Service: Gastroenterology;;    Social History  reports that he has never smoked. He quit smokeless tobacco use about 7 months ago.  His smokeless tobacco use included chew. He reports previous alcohol use. He reports previous drug use. Drug: Marijuana.  Allergies  Allergen Reactions  . Ibuprofen Other (See Comments)    Was told to not take this   . Metformin And Related Other (See Comments)    Bloody stools   . Naproxen Other (See Comments)    Was told to not take this   . Yellow [place]et Venom [Bee Venom] Swelling and Other (See Comments)    Severe swelling where stung  . Penicillins Hives    Did it involve swelling of the face/tongue/throat, SOB, or low BP? Unk Did it involve sudden or severe rash/hives, skin peeling, or any reaction on the inside of your mouth or nose? Yes Did you need to seek medical attention at a hospital or doctor's office? Unk When did it last happen?"Childhood- 53 years ago" If all above answers are "NO", may proceed with cephalosporin use.   . Celexa [Citalopram]     Contraindicated with A-Fib    Family History  Problem Relation Age of Onset  .  Diverticulitis Mother   . Hypertension Father   . Heart disease Father        MI  . Prostate cancer Father   . Colon cancer Neg Hx   . Stomach cancer Neg Hx   . Pancreatic cancer Neg Hx   . Rectal cancer Neg Hx      Prior to Admission medications   Medication Sig Start Date End Date Taking? Authorizing Provider  acetaminophen (TYLENOL) 500 MG tablet Take 500 mg by mouth every 6 (six) hours as needed for fever.   Yes [provider]  clonazePAM (KLONOPIN) 1 MG disintegrating tablet Take 1 tablet (1 mg total) by mouth 2 (two) times daily as needed. Patient taking differently: Take 1 mg by mouth 2 (two) times daily as needed (for anxiety).  10/23/19  Yes Love, Ivan Anchors, PA-C  clopidogrel (PLAVIX) 75 MG tablet Take 1 tablet (75 mg total) by mouth daily. 10/23/19  Yes Love, Ivan Anchors, PA-C  diltiazem (CARDIZEM CD) 180 MG 24 hr capsule TAKE 1 CAPSULE BY MOUTH EVERY DAY Patient taking differently: Take 180 mg by mouth daily.  12/10/19  Yes End, Harrell Gave, MD  ezetimibe (ZETIA) 10 MG tablet Take 10 mg  by mouth at bedtime. 05/25/19  Yes [provider]  fluticasone (FLONASE) 50 MCG/ACT nasal spray Place 1-2 sprays into both nostrils as needed (for seasonal allergies).    Yes [provider]  gabapentin (NEURONTIN) 100 MG capsule TAKE 1 CAPSULE BY MOUTH AT BEDTIME. Patient taking differently: Take 100 mg by mouth at bedtime.  11/19/19  Yes Lovorn, Jinny Blossom, MD  glimepiride (AMARYL) 2 MG tablet Take 1 tablet (2 mg total) by mouth daily with breakfast. 10/24/19  Yes Love, Ivan Anchors, PA-C  metoprolol succinate (TOPROL XL) 100 MG 24 hr tablet Take 1 tablet (100 mg total) by mouth in the morning and at bedtime. 11/09/19 11/08/20 Yes Sherran Needs, NP  rosuvastatin (CRESTOR) 20 MG tablet Take 1 tablet (20 mg total) by mouth daily. Patient taking differently: Take 20 mg by mouth at bedtime.  10/23/19 01/21/20 Yes Love, Ivan Anchors, PA-C  sertraline (ZOLOFT) 50 MG tablet Take 50 mg by mouth  daily. 11/16/19  Yes [provider]  traZODone (DESYREL) 50 MG tablet TAKE 1 TABLET (50 MG TOTAL) BY MOUTH AT BEDTIME AS NEEDED FOR SLEEP. 11/19/19  Yes Lovorn, Jinny Blossom, MD  acetaminophen (TYLENOL) 325 MG tablet Take 1-2 tablets (325-650 mg total) by mouth every 4 (four) hours as needed for mild pain. 10/21/19   Love, Ivan Anchors, PA-C  glucose blood test strip Monitor BS before meals and at bedtime for a couple of weeks. Then may decrease to bid before meals 10/23/19   Love, Ivan Anchors, PA-C  Lancets [facility] ULTRASOFT) lancets Use as instructed 10/23/19   Love, Ivan Anchors, PA-C  lidocaine-prilocaine (EMLA) cream Apply to affected area once Patient not taking: Reported on 12/16/2019 11/25/19   Volanda Napoleon, MD  LORazepam (ATIVAN) 0.5 MG tablet Take 1 tablet (0.5 mg total) by mouth every 6 (six) hours as needed (Nausea or vomiting). Patient not taking: Reported on 12/16/2019 11/25/19   Volanda Napoleon, MD  nitroGLYCERIN (NITROSTAT) 0.4 MG SL tablet Place 0.4 mg under the tongue every 5 (five) minutes as needed for chest pain.  05/25/19 05/24/20  [provider]  ondansetron (ZOFRAN) 8 MG tablet Take 1 tablet (8 mg total) by mouth 2 (two) times daily as needed for refractory nausea / vomiting. Start on day 3 after chemotherapy. 11/25/19   Volanda Napoleon, MD  prochlorperazine (COMPAZINE) 10 MG tablet Take 1 tablet (10 mg total) by mouth every 6 (six) hours as needed (Nausea or vomiting). Patient taking differently: Take 10 mg by mouth every 6 (six) hours as needed for nausea or vomiting.  11/25/19   Volanda Napoleon, MD    Physical Exam: Vitals:   12/16/19 0055 12/16/19 0057 12/16/19 0437 12/16/19 0515  BP:  109/86 106/70 97/64  Pulse:  (!) 130 (!) 115 (!) 111  Resp:  16 10 20   Temp:  (!) 103.1 F (39.5 C) 98.4 F (36.9 C)   TempSrc:  Oral    SpO2:  98% 98% 98%  Weight: 93 kg     Height: 5\' 10"  (1.778 m)       Constitutional: NAD, calm, comfortable Vitals:   12/16/19 0055 12/16/19  0057 12/16/19 0437 12/16/19 0515  BP:  109/86 106/70 97/64  Pulse:  (!) 130 (!) 115 (!) 111  Resp:  16 10 20   Temp:  (!) 103.1 F (39.5 C) 98.4 F (36.9 C)   TempSrc:  Oral    SpO2:  98% 98% 98%  Weight: 93 kg     Height:  5\' 10"  (1.778 m)      General: WDWN, Alert and oriented x3.  Eyes: EOMI, PERRL, lids and conjunctivae normal.  Sclera nonicteric HENT:  [place]/AT, external ears normal.  Nares patent without epistasis.  Mucous membranes are moist. Posterior pharynx clear of any exudate or lesions. Normal dentition.  Neck: Soft, normal range of motion, supple, no masses, no thyromegaly.  Trachea midline Respiratory: clear to auscultation bilaterally, no wheezing, no crackles. Normal respiratory effort. No accessory muscle use.  Cardiovascular: Irregularly irregular rhythm with tachycardia.  No murmurs / rubs / gallops. No extremity edema. 2+ pedal pulses.  Abdomen: Soft, no tenderness, nondistended, no rebound or guarding.  No masses palpated. Bowel sounds normoactive Musculoskeletal: FROM. no clubbing / cyanosis. No joint deformity upper and lower extremities. no contractures. Normal muscle tone.  Skin: Warm, dry, intact no rashes, lesions, ulcers. No induration Neurologic: CN 2-12 grossly intact.  Normal speech.  Sensation intact, patella DTR +1 bilaterally. Strength 5/5 in all extremities.   Psychiatric: Normal judgment and insight.  Normal mood.    Labs on Admission: I have personally reviewed following labs and imaging studies  CBC: Recent Labs  Lab 12/09/19 0800 12/16/19 0110  WBC 5.4 2.5*  NEUTROABS 3.6 2.2  HGB 10.4* 10.8*  HCT 31.6* 32.6*  MCV 89.8 88.6  PLT 186 527    Basic Metabolic Panel: Recent Labs  Lab 12/09/19 0800 12/16/19 0110  NA 144 138  K 3.1* 2.7*  CL 107 106  CO2 29 24  GLUCOSE 143* 141*  BUN 21 22  CREATININE 1.11 1.25*  CALCIUM 9.2 8.7*  MG  --  1.8    GFR: Estimated Creatinine Clearance: 67.5 mL/min (A) (by C-G formula based on SCr of  1.25 mg/dL (H)).  Liver Function Tests: Recent Labs  Lab 12/09/19 0800 12/16/19 0110  AST 12* 18  ALT 12 18  ALKPHOS 49 45  BILITOT 0.8 1.3*  PROT 5.9* 6.0*  ALBUMIN 4.0 3.6    Urine analysis:    Component Value Date/Time   COLORURINE YELLOW 12/16/2019 [place] 12/16/2019 0058   LABSPEC 1.020 12/16/2019 0058   PHURINE 5.0 12/16/2019 0058   GLUCOSEU NEGATIVE 12/16/2019 0058   HGBUR NEGATIVE 12/16/2019 0058   BILIRUBINUR NEGATIVE 12/16/2019 0058   KETONESUR NEGATIVE 12/16/2019 0058   PROTEINUR NEGATIVE 12/16/2019 0058   NITRITE NEGATIVE 12/16/2019 0058   LEUKOCYTESUR NEGATIVE 12/16/2019 0058    Radiological Exams on Admission: DG Chest 2 View  Result Date: 12/16/2019 CLINICAL DATA:  Suspected sepsis. EXAM: CHEST - 2 VIEW COMPARISON:  11/06/2019.  CT 10/10/2019 FINDINGS: Right chest port with tip in the mid SVC. Normal heart size and mediastinal contours. Coronary stent. No focal airspace disease. No pleural effusion. No pneumothorax. No pulmonary edema. Degenerative change of the right shoulder. IMPRESSION: No acute chest finding.  Right chest port in place. Electronically Signed   By: Keith Rake M.D.   On: 12/16/2019 01:32    EKG: Patient in atrial fibrillation on cardiac monitor with occasional PVCs.  No acute ST elevation or depression.  Assessment/Plan Active Problems:   Fever in adult Mr. Beckley will be observed on medical telemetry floor.  He was given acetaminophen in the emergency room and fever is improved.  No source of fever is identified in the emergency room.  Cultures were obtained and are pending.  Patient was started empirically on vancomycin and meropenem after ER provider discussed with oncology as patient has active rectal cancer and is on  chemotherapy.  Patient has mild leukopenia but absolute neutrophils are normal With patient having a fever of unknown origin with active cancer and chemotherapy will obtain CT angiography of chest  to make sure there is no PE.  PE could cause fever as well as tachycardia and aggravate his A. fib.    Paroxysmal atrial fibrillation (HCC) Continue metoprolol and diltiazem that patient takes at home.  Patient placed on heparin for anticoagulation    Essential hypertension  Home medications will be continued for atrial fibrillation and blood pressure.  Monitor blood pressure.     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 [facility])  Patient is actively receiving chemotherapy with oncology.  Last chemotherapy dose was a week ago. He has completed 2 courses of chemotherapy    Hypokalemia Magnesium level is obtained and is normal. Replete potassium.  Recheck electrolytes and renal function in am.     Leukopenia due to antineoplastic chemotherapy ([place]) White blood cells decreased at 2.5.  Monitor CBC    Coronary artery disease Continue home medications     DVT prophylaxis: Patient placed on heparin for full therapeutic anticoagulation with atrial fibrillation.  Pharmacy to dose Code Status:   Full code Family Communication:  Diagnosis and plan discussed with patient.  Patient verbalized understanding agrees with plan.  Further recommendation to follow as clinical indicated Disposition Plan:   Patient is from:  Home  Anticipated DC to:  Home  Anticipated DC date:  Anticipate discharge in less than 2 midnights  Anticipated DC barriers: No barriers to discharge identified at this time  Consults called:  Oncology Admission status:  Outpatient    Yevonne Aline Anniece Bleiler MD [facility]  How to contact the [facility] Attending or Consulting provider [place] or covering provider during after hours [place], for this patient?   1. Check the care team in [facility] and look for a) attending/consulting [facility] provider listed and b) the [facility] team listed 2. Log into [email or web address] and use [place]'s universal password to access. If you do not have the password, please contact the hospital  operator. 3. Locate the [facility] provider you are looking for under [facility] and page to a number that you can be directly reached. 4. If you still have difficulty reaching the provider, please page the [facility] (Director on Call) for the Hospitalists listed on amion for assistance.  12/16/2019, 5:26 AM

## 2019-12-16 NOTE — ED Notes (Signed)
Introduced self to pt. Denies pain or discomfort. Informed of meds needing to be given and the need for an additional IV site. Pt states 'if you have to do it, fine'. Merrem finished infusing. Potassium almost complete.

## 2019-12-16 NOTE — ED Notes (Signed)
Patient transported to CT 

## 2019-12-16 NOTE — ED Notes (Signed)
OK to admin another 500mg  Tylenol since patient only took 500mg  PTA.

## 2019-12-16 NOTE — Progress Notes (Addendum)
PROGRESS NOTE    John Douglas  FGH:[id number] DOB: March 01, 1956 DOA: 12/16/2019 PCP: Leonides Sake, MD    Brief Narrative:  John Douglas is a 64 y.o. male with medical history significant for rectal cancer recently started on chemotherapy, hypertension, CAD, previous MI,  diabetes mellitus, previous stroke who presents for evaluation of fever and chills.  He reports he went to dinner with some friends as and on the way home he developed chills and when he got home his temperature was 101 degrees.  His daughter was with him and became concerned and called her oncologist to advised him to come to the emergency room for evaluation. When he arrived in the emergency room temperature was 103 degrees. He continued to have chills but did not have any cough, chest pain, palpitations, nausea, vomiting, diarrhea, abdominal pain, urinary frequency or dysuria.  He has not had any rash.  He denies any known sick contacts. He did have his COVID-19 vaccine in June.  Reports he has never had DVT or PE and has not had any prolonged immobilization or recent travel.   In the ED, temperature 103.  Given Tylenol and fever has broken.  Noted to have decreased white blood cell count with normal neutrophil value.  Chest x-ray did not reveal any pneumonia.  Urinalysis is negative for infection.  No source of fever identified.  ER provider discussed with oncology who recommended patient stay for empiric antibiotics while cultures are pending.   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 coli septicemia Patient presenting to the ED with chills and fever.  Reports was having dinner when driving home with symptom onset.  Temperature 103.1 on presentation.  Lactic acid mildly elevated  at 1.5.  Patient with leukopenia without neutropenia, likely secondary to active chemotherapy.  Covid-19 PCR negative.  Respiratory viral panel negative.  Chest x-ray with no acute cardiopulmonary disease process.  Urinalysis unrevealing.  CT angiogram chest negative for PE.  Unclear etiology, suspect possible viral source. --Blood cultures x 2 positive for GNR's with BCID = Ecoli --De-escalate vancomycin and meropenem to ceftriaxone 2 g IV every 24 hours --Await further blood culture susceptibilities --Urine culture: Pending --Continue to monitor fever curve --Antipyretics as needed  Hypokalemia Potassium 2.7 on admission with magnesium 1.8.  Repleted. --Monitor electrolytes closely daily  Leukopenia secondary to antineoplastic chemotherapy WBC count of 2.5; neutrophils 87; no neutropenia --Continue monitor CBC daily  Mucosal changes distal esophagus Incidental finding of mildly thickened distal esophagus on CT angiogram chest.  Recommend outpatient follow-up/referral to gastroenterology for consideration of EGD for further evaluation.  Paroxysmal atrial fibrillation Essential hypertension --Continue diltiazem 180 mg p.o. daily metoprolol succinate 100 mg p.o. daily --Currently not on anticoagulation per oncology  Type 2 diabetes mellitus Hemoglobin A1c 8.0 on 10/09/2019.  On Amaryl 2 mg p.o. daily outpatient. --Continue to hold oral hypoglycemics while inpatient --Insulin sliding scale for coverage --CBGs before every meal/at bedtime  CAD --Continue Plavix 75 mg p.o. daily --Continue statin  HLD: Continue Crestor 20 mg p.o. daily and Zetia 10 mg p.o. 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 Marin Olp.  Has completed 2 courses of chemotherapy, last chemotherapy 1 week prior. --Continue outpatient follow-up  with oncology   DVT prophylaxis: Heparin drip Code Status: Full code Family Communication: No family  present at bedside this morning  Disposition Plan:  Status is: Observation  The patient remains OBS appropriate and will d/c before 2 midnights.  Dispo: The patient is from: Home              Anticipated d/c is to: Home              Anticipated d/c date is: 1 day              Patient currently is not medically stable to d/c.    Consultants:   None  Procedures:   None  Antimicrobials:   Vancomycin 8/18 - 8/18  Meropenem 8/18 - 8/18  Ceftriaxone 8/18>>   Subjective: Patient seen and examined bedside, resting comfortably.  States has been fever free since receiving Tylenol on presentation to ED this morning.  Feels that this is a viral illness, no other complaints or concerns at this time.  Denies headache, no current fever, no current chills/night sweats, no nausea/vomiting/diarrhea, no chest pain, palpitations, no shortness of breath, no abdominal pain, no weakness, no fatigue, no paresthesias.  No acute events overnight per nursing staff.  Objective: Vitals:   12/16/19 0515 12/16/19 0558 12/16/19 0700 12/16/19 0800  BP: 97/64 (!) 92/57 96/67 105/68  Pulse: (!) 111 (!) 103 (!) 103   Resp: 20 (!) 24 18 (!) 25  Temp:    (!) 97.3 F (36.3 C)  TempSrc:    Axillary  SpO2: 98% 97% 98%   Weight:      Height:       No intake or output data in the 24 hours ending 12/16/19 1403 Filed Weights   12/16/19 0055  Weight: 93 kg    Examination:  General exam: Appears calm and comfortable  Respiratory system: Clear to auscultation. Respiratory effort normal. Cardiovascular system: S1 & S2 heard, RRR. No JVD, murmurs, rubs, gallops or clicks. No pedal edema. Gastrointestinal system: Abdomen is nondistended, soft and nontender. No organomegaly or masses felt. Normal bowel sounds heard. Central nervous system: Alert and oriented. No focal neurological deficits. Extremities: Symmetric 5 x 5 power. Skin: No rashes, lesions or ulcers Psychiatry: Judgement and insight appear  normal. Mood & affect appropriate.     Data Reviewed: I have personally reviewed following labs and imaging studies  CBC: Recent Labs  Lab 12/16/19 0110  WBC 2.5*  NEUTROABS 2.2  HGB 10.8*  HCT 32.6*  MCV 88.6  PLT 119   Basic Metabolic Panel: Recent Labs  Lab 12/16/19 0110 12/16/19 0756  NA 138  --   K 2.7* 4.2  CL 106  --   CO2 24  --   GLUCOSE 141*  --   BUN 22  --   CREATININE 1.25*  --   CALCIUM 8.7*  --   MG 1.8 1.9   GFR: Estimated Creatinine Clearance: 67.5 mL/min (A) (by C-G formula based on SCr of 1.25 mg/dL (H)). Liver Function Tests: Recent Labs  Lab 12/16/19 0110  AST 18  ALT 18  ALKPHOS 45  BILITOT 1.3*  PROT 6.0*  ALBUMIN 3.6   No results for input(s): LIPASE, AMYLASE in the last 168 hours. No results for input(s): AMMONIA in the last 168 hours. Coagulation Profile: Recent Labs  Lab 12/16/19 0110  INR 1.3*   Cardiac Enzymes: No results for input(s): CKTOTAL, CKMB, CKMBINDEX, TROPONINI in the last 168 hours. BNP (last 3 results) No  results for input(s): PROBNP in the last 8760 hours. HbA1C: No results for input(s): HGBA1C in the last 72 hours. CBG: No results for input(s): GLUCAP in the last 168 hours. Lipid Profile: No results for input(s): CHOL, HDL, LDLCALC, TRIG, CHOLHDL, LDLDIRECT in the last 72 hours. Thyroid Function Tests: No results for input(s): TSH, T4TOTAL, FREET4, T3FREE, THYROIDAB in the last 72 hours. Anemia Panel: No results for input(s): VITAMINB12, FOLATE, FERRITIN, TIBC, IRON, RETICCTPCT in the last 72 hours. Sepsis Labs: Recent Labs  Lab 12/16/19 0110  LATICACIDVEN 1.5    Recent Results (from the past 240 hour(s))  Culture, blood (Routine x 2)     Status: None (Preliminary result)   Collection Time: 12/16/19 12:45 AM   Specimen: BLOOD LEFT ARM  Result Value Ref Range Status   Specimen Description BLOOD LEFT ARM  Final   Special Requests   Final    BOTTLES DRAWN AEROBIC AND ANAEROBIC Blood Culture  adequate volume   Culture  Setup Time   Final    GRAM NEGATIVE RODS IN BOTH AEROBIC AND ANAEROBIC BOTTLES Organism ID to follow Performed at [facility], [place] [address] [place], [place] [address]    Culture PENDING  Incomplete   Report Status PENDING  Incomplete  Culture, blood (Routine x 2)     Status: None (Preliminary result)   Collection Time: 12/16/19  1:08 AM   Specimen: BLOOD LEFT HAND  Result Value Ref Range Status   Specimen Description BLOOD LEFT HAND  Final   Special Requests   Final    BOTTLES DRAWN AEROBIC ONLY Blood Culture results may not be optimal due to an excessive volume of blood received in culture bottles   Culture  Setup Time   Final    GRAM NEGATIVE RODS AEROBIC BOTTLE ONLY Performed at [facility], [place] [address] [place], [place] [address]    Culture PENDING  Incomplete   Report Status PENDING  Incomplete  SARS Coronavirus 2 by RT PCR (hospital order, performed in [facility] hospital lab) Nasopharyngeal Nasopharyngeal Swab     Status: None   Collection Time: 12/16/19  1:25 AM   Specimen: Nasopharyngeal Swab  Result Value Ref Range Status   SARS Coronavirus 2 NEGATIVE NEGATIVE Final    Comment: (NOTE) SARS-CoV-2 target nucleic acids are NOT DETECTED.  The SARS-CoV-2 RNA is generally detectable in upper and lower respiratory specimens during the acute phase of infection. The lowest concentration of SARS-CoV-2 viral copies this assay can detect is 250 copies / mL. A negative result does not preclude SARS-CoV-2 infection and should not be used as the sole basis for treatment or other patient management decisions.  A negative result may occur with improper specimen collection / handling, submission of specimen other than nasopharyngeal swab, presence of viral mutation(s) within the areas targeted by this assay, and inadequate number of viral copies (<250 copies / mL). A negative result must be combined with clinical observations, patient  history, and epidemiological information.  Fact Sheet for Patients:   StrictlyIdeas.no  Fact Sheet for Healthcare Providers: BankingDealers.co.za  This test is not yet approved or  cleared by the [place] FDA and has been authorized for detection and/or diagnosis of SARS-CoV-2 by FDA under an Emergency Use Authorization (EUA).  This EUA will remain in effect (meaning this test can be used) for the duration of the COVID-19 declaration under Section 564(b)(1) of the Act, 21 U.S.C. section 360bbb-3(b)(1), unless the authorization is terminated or revoked sooner.  Performed at [facility], [place] [address], [place], [place] [address]   Respiratory Panel by PCR     Status: None   Collection Time: 12/16/19  4:32 AM   Specimen: Nasopharyngeal Swab; Respiratory  Result Value Ref Range Status   Adenovirus NOT DETECTED NOT DETECTED Final   Coronavirus 229E NOT DETECTED NOT DETECTED Final    Comment: (NOTE) The Coronavirus on the Respiratory Panel, DOES NOT test for the novel  Coronavirus (2019 nCoV)    Coronavirus HKU1 NOT DETECTED NOT DETECTED Final   Coronavirus NL63 NOT DETECTED NOT DETECTED Final   Coronavirus OC43 NOT DETECTED NOT DETECTED Final   Metapneumovirus NOT DETECTED NOT DETECTED Final   Rhinovirus / Enterovirus NOT DETECTED NOT DETECTED Final   Influenza A NOT DETECTED NOT DETECTED Final   Influenza B NOT DETECTED NOT DETECTED Final   Parainfluenza Virus 1 NOT DETECTED NOT DETECTED Final   Parainfluenza Virus 2 NOT DETECTED NOT DETECTED Final   Parainfluenza Virus 3 NOT DETECTED NOT DETECTED Final   Parainfluenza Virus 4 NOT DETECTED NOT DETECTED Final   Respiratory Syncytial Virus NOT DETECTED NOT DETECTED Final   Bordetella pertussis NOT DETECTED NOT DETECTED Final   Chlamydophila pneumoniae NOT DETECTED NOT DETECTED Final   Mycoplasma pneumoniae NOT DETECTED NOT DETECTED Final    Comment: Performed at [facility], [place]. [address], [place], [place] [address]         Radiology Studies: DG Chest 2 View  Result Date: 12/16/2019 CLINICAL DATA:  Suspected sepsis. EXAM: CHEST - 2 VIEW COMPARISON:  11/06/2019.  CT 10/10/2019 FINDINGS: Right chest port with tip in the mid SVC. Normal heart size and mediastinal contours. Coronary stent. No focal airspace disease. No pleural effusion. No pneumothorax. No pulmonary edema. Degenerative change of the right shoulder. IMPRESSION: No acute chest finding.  Right chest port in place. Electronically Signed   By: Keith Rake M.D.   On: 12/16/2019 01:32   CT ANGIO CHEST PE W OR WO CONTRAST  Result Date: 12/16/2019 CLINICAL DATA:  Shortness of breath, fever for 3 days, history of cancer, recent stent placements as well. Stroke in the past year. Active colon cancer EXAM: CT ANGIOGRAPHY CHEST WITH CONTRAST TECHNIQUE: Multidetector CT imaging of the chest was performed using the standard protocol during bolus administration of intravenous contrast. Multiplanar CT image reconstructions and MIPs were obtained to evaluate the vascular anatomy. CONTRAST:  4mL OMNIPAQUE IOHEXOL 350 MG/ML SOLN COMPARISON:  CT 10/10/2019 FINDINGS: Cardiovascular: Satisfactory opacification of the pulmonary arteries though evaluation is limited by extensive respiratory and cardiac pulsation artifact. There is likely artifactual hypoattenuation in the lobar branch of the right middle lobe when viewed on coronal imaging without reliable correlate. No convincing central, lobar or proximal segmental filling defects are identified. Central pulmonary arteries are upper limits normal caliber. Mild cardiomegaly. Four-chamber cardiac enlargement is noted. Three-vessel native coronary artery disease with stents in the right coronary and LAD. No pericardial effusion. Atherosclerotic plaque within the normal caliber aorta. No acute luminal abnormality of the aorta. No periaortic stranding or hemorrhage.  Normal 3 vessel branching of the aortic arch with atheromatous plaque in the otherwise unremarkable proximal great vessels. A right IJ approach Port-A-Cath reservoir in the right chest wall, tip terminates at the superior cavoatrial junction. No major venous abnormalities are seen. Mediastinum/Nodes: No mediastinal fluid or gas. Normal thyroid gland and thoracic inlet. No acute abnormality of the trachea. Slightly patulous and mildly thickened distal thoracic esophagus with small  hiatal hernia. No worrisome mediastinal, hilar or axillary adenopathy. Lungs/Pleura: Respiratory motion artifact limits evaluation of the lung parenchyma. Some mild airways thickening is noted which could reflect some bronchitic change. No consolidation, features of edema, pneumothorax, or effusion. No suspicious pulmonary nodules or masses. Upper Abdomen: No acute abnormalities present in the visualized portions of the upper abdomen. Small accessory splenule. Hiatal hernia, as above. Musculoskeletal: Scoliotic curvature of the upper to midthoracic spine is similar to prior with some mildly exaggerated thoracic kyphotic curvature as well. Minimal degenerative changes in the spine similar to priors. No acute osseous abnormality or suspicious osseous lesion. No worrisome chest wall lesions. Right Port-A-Cath, as above. Review of the MIP images confirms the above findings. IMPRESSION: 1. Study is limited by extensive respiratory and cardiac pulsation artifact. 2. No convincing evidence of acute pulmonary embolism to the lobar level. 3. Mild airways thickening may reflect some bronchitic change. No other acute intrathoracic process. 4. Cardiomegaly with three-vessel native coronary artery disease and stents in the right coronary and LAD. 5. Small hiatal hernia with patulous and mildly thickened distal thoracic esophagus, correlate for features of esophagitis and consider correlation with outpatient endoscopy. 6. Aortic Atherosclerosis  (ICD10-I70.0). Electronically Signed   By: Lovena Le M.D.   On: 12/16/2019 05:58        Scheduled Meds: . clopidogrel  75 mg Oral Daily  . diltiazem  180 mg Oral Daily  . ezetimibe  10 mg Oral QHS  . glimepiride  2 mg Oral Q breakfast  . metoprolol succinate  100 mg Oral Daily  . rosuvastatin  20 mg Oral QHS  . sertraline  50 mg Oral Daily   Continuous Infusions: . heparin 1,350 Units/hr (12/16/19 0751)  . meropenem (MERREM) IV    . vancomycin       LOS: 0 days    Time spent: 41 minutes spent on chart review, discussion with nursing staff, consultants, updating family and interview/physical exam; more than 50% of that time was spent in counseling and/or coordination of care.    Arron Mcnaught J [place] DO [facility] Available via Epic secure chat 7am-7pm After these hours, please refer to coverage provider listed on [email or web address] 12/16/2019, 2:03 PM

## 2019-12-16 NOTE — ED Notes (Signed)
Dr Leonette Monarch seeing patient in triage

## 2019-12-16 NOTE — ED Provider Notes (Signed)
[facility] EMERGENCY DEPARTMENT Provider Note  CSN: [id number] Arrival date & time: 12/16/19 0048  Chief Complaint(s) Fever  HPI John Douglas is a 64 y.o. male with a history of stage IV colon cancer currently on chemotherapy who presents to the emergency department with 1 day of fevers and chills.  Symptoms began several hours prior to arrival.  He denies any known sick contacts.  Denies any headache, nasal congestion, cough, chest pain, shortness of breath myalgias, nausea, vomiting, abdominal pain, or urinary symptoms.  Last chemoinfusion was approximately 1 week ago.  HPI  Past Medical History Past Medical History:  Diagnosis Date  . Allergic rhinitis    [facility]  . Anxiety   . Atrial fibrillation ([place])    [facility]  . Benign essential hypertension    [facility]  . Coronary artery disease    [facility]  . Diabetic neuropathy, type II diabetes mellitus ([place])    [facility]  . Iron deficiency anemia due to chronic blood loss 11/18/2019  . Mixed hyperlipidemia    [facility]  . Myocardial infarct [facility])    [facility]  . Obesity, unspecified    [facility]  . Rectal bleeding    [facility]  . 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 Wide-complex tachycardia ([place]) 06/04/2019   Patient Active Problem List   Diagnosis Date Noted  . Iron deficiency anemia due to chronic blood loss 11/18/2019  . 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 Generalized anxiety disorder 10/26/2019  . Diabetes mellitus ([place]) 10/26/2019  . Acute ischemic right PCA stroke ([place]) 10/15/2019  . Ischemic stroke ([place]) 10/08/2019  . Hypokalemia   . Change in bowel habits   . Rectal bleeding   . Polyp of descending colon   .  Diverticulosis of colon without hemorrhage   . Gastrointestinal hemorrhage 08/29/2019  . Coronary artery disease 08/29/2019  . Ischemic cardiomyopathy 08/29/2019  . Paroxysmal atrial fibrillation (HCC)   . Essential hypertension   . Hyperlipidemia LDL goal <70   . Non-ST elevation (NSTEMI) myocardial infarction ([place])   . Ventricular tachycardia ([place]) 06/04/2019   Home Medication(s) Prior to Admission medications   Medication Sig Start Date End Date Taking? Authorizing Provider  acetaminophen (TYLENOL) 325 MG tablet Take 1-2 tablets (325-650 mg total) by mouth every 4 (four) hours as needed for mild pain. 10/21/19   Love, Ivan Anchors, PA-C  clonazePAM (KLONOPIN) 1 MG disintegrating tablet Take 1 tablet (1 mg total) by mouth 2 (two) times daily as needed. 10/23/19   Love, Ivan Anchors, PA-C  clopidogrel (PLAVIX) 75 MG tablet Take 1 tablet (75 mg total) by mouth daily. 10/23/19   Love, Ivan Anchors, PA-C  diltiazem (CARDIZEM CD) 180 MG 24 hr capsule TAKE 1 CAPSULE BY MOUTH EVERY DAY 12/10/19   End, Harrell Gave, MD  ezetimibe (ZETIA) 10 MG tablet Take 10 mg by mouth at bedtime. 05/25/19   [provider]  fluticasone (FLONASE) 50 MCG/ACT nasal spray Place 1-2 sprays into both nostrils as needed for allergies or rhinitis (or seasonal allergies).     [provider]  gabapentin (NEURONTIN) 100 MG capsule TAKE 1 CAPSULE BY MOUTH AT BEDTIME. 11/19/19   Lovorn, Jinny Blossom, MD  glimepiride (AMARYL) 2 MG tablet Take 1 tablet (2 mg total) by mouth daily with breakfast. 10/24/19   Love,  Ivan Anchors, PA-C  glucose blood test strip Monitor BS before meals and at bedtime for a couple of weeks. Then may decrease to bid before meals 10/23/19   Love, Ivan Anchors, PA-C  Lancets [facility] ULTRASOFT) lancets Use as instructed 10/23/19   Love, Ivan Anchors, PA-C  lidocaine-prilocaine (EMLA) cream Apply to affected area once 11/25/19   Ennever, Rudell Cobb, MD  LORazepam (ATIVAN) 0.5 MG tablet Take 1 tablet (0.5 mg total) by mouth every  6 (six) hours as needed (Nausea or vomiting). 11/25/19   Volanda Napoleon, MD  metoprolol succinate (TOPROL XL) 100 MG 24 hr tablet Take 1 tablet (100 mg total) by mouth in the morning and at bedtime. 11/09/19 11/08/20  Sherran Needs, NP  nitroGLYCERIN (NITROSTAT) 0.4 MG SL tablet Place 0.4 mg under the tongue every 5 (five) minutes as needed for chest pain.  05/25/19 05/24/20  [provider]  ondansetron (ZOFRAN) 8 MG tablet Take 1 tablet (8 mg total) by mouth 2 (two) times daily as needed for refractory nausea / vomiting. Start on day 3 after chemotherapy. 11/25/19   Volanda Napoleon, MD  prochlorperazine (COMPAZINE) 10 MG tablet Take 1 tablet (10 mg total) by mouth every 6 (six) hours as needed (Nausea or vomiting). 11/25/19   Volanda Napoleon, MD  rosuvastatin (CRESTOR) 20 MG tablet Take 1 tablet (20 mg total) by mouth daily. 10/23/19 01/21/20  Love, Ivan Anchors, PA-C  sertraline (ZOLOFT) 50 MG tablet Take 50 mg by mouth daily. 11/16/19   [provider]  traZODone (DESYREL) 50 MG tablet TAKE 1 TABLET (50 MG TOTAL) BY MOUTH AT BEDTIME AS NEEDED FOR SLEEP. 11/19/19   Courtney Heys, MD                                                                                                                                    Past Surgical History Past Surgical History:  Procedure Laterality Date  . BIOPSY  10/07/2019   Procedure: BIOPSY;  Surgeon: Lavena Bullion, DO;  Location: [facility];  Service: Gastroenterology;;  . COLONOSCOPY WITH PROPOFOL N/A 10/07/2019   Procedure: COLONOSCOPY WITH PROPOFOL;  Surgeon: Lavena Bullion, DO;  Location: [facility];  Service: Gastroenterology;  Laterality: N/A;  . CORONARY ANGIOPLASTY     3 stents  . IR IMAGING GUIDED PORT INSERTION  11/24/2019  . LEFT HEART CATH AND CORONARY ANGIOGRAPHY N/A 06/05/2019   Procedure: LEFT HEART CATH AND CORONARY ANGIOGRAPHY;  Surgeon: Nelva Bush, MD;  Location: [place] [facility];  Service: Cardiovascular;  Laterality:  N/A;  . POLYPECTOMY  10/07/2019   Procedure: POLYPECTOMY;  Surgeon: Lavena Bullion, DO;  Location: [facility];  Service: Gastroenterology;;  . Lia Foyer TATTOO INJECTION  10/07/2019   Procedure: SUBMUCOSAL TATTOO INJECTION;  Surgeon: Lavena Bullion, DO;  Location: [facility];  Service: Gastroenterology;;   Family History Family History  Problem Relation Age of Onset  .  Diverticulitis Mother   . Hypertension Father   . Heart disease Father        MI  . Prostate cancer Father   . Colon cancer Neg Hx   . Stomach cancer Neg Hx   . Pancreatic cancer Neg Hx   . Rectal cancer Neg Hx     Social History Social History   Tobacco Use  . Smoking status: Never Smoker  . Smokeless tobacco: Former Systems developer    Types: Secondary school teacher  . Vaping Use: Never used  Substance Use Topics  . Alcohol use: Not Currently    Comment: occasional  . Drug use: Not Currently    Types: Marijuana   Allergies Ibuprofen, Metformin and related, Naproxen, Yellow jacket venom [bee venom], and Penicillins  Review of Systems Review of Systems All other systems are reviewed and are negative for acute change except as noted in the HPI  Physical Exam Vital Signs  I have reviewed the triage vital signs BP 109/86 (BP Location: Left Arm)   Pulse (!) 130   Temp (!) 103.1 F (39.5 C) (Oral)   Resp 16   Ht 5\' 10"  (1.778 m)   Wt 93 kg   SpO2 98%   BMI 29.42 kg/m   Physical Exam Vitals reviewed.  Constitutional:      General: He is not in acute distress.    Appearance: He is well-developed. He is not diaphoretic.  HENT:     Head: Normocephalic and atraumatic.     Right Ear: Tympanic membrane normal.     Left Ear: Tympanic membrane normal.     Nose: Nose normal.     Mouth/Throat:     Mouth: Mucous membranes are moist. No oral lesions.     Dentition: Dental caries present.     Tongue: No lesions.     Palate: No lesions.     Pharynx: Oropharynx is clear.     Tonsils: No tonsillar exudate.   Eyes:     General: No scleral icterus.       Right eye: No discharge.        Left eye: No discharge.     Conjunctiva/sclera: Conjunctivae normal.     Pupils: Pupils are equal, round, and reactive to light.  Cardiovascular:     Rate and Rhythm: Regular rhythm. Tachycardia present.     Heart sounds: No murmur heard.  No friction rub. No gallop.   Pulmonary:     Effort: Pulmonary effort is normal. No respiratory distress.     Breath sounds: Normal breath sounds. No stridor. No rales.  Abdominal:     General: There is no distension.     Palpations: Abdomen is soft.     Tenderness: There is no abdominal tenderness.  Musculoskeletal:        General: No tenderness.     Cervical back: Normal range of motion and neck supple.  Skin:    General: Skin is warm and dry.     Findings: No erythema or rash.  Neurological:     Mental Status: He is alert and oriented to person, place, and time.     ED Results and Treatments Labs (all labs ordered are listed, but only abnormal results are displayed) Labs Reviewed  COMPREHENSIVE METABOLIC PANEL - Abnormal; Notable for the following components:      Result Value   Potassium 2.7 (*)    Glucose, Bld 141 (*)    Creatinine, Ser 1.25 (*)    Calcium 8.7 (*)  Total Protein 6.0 (*)    Total Bilirubin 1.3 (*)    All other components within normal limits  CBC WITH DIFFERENTIAL/PLATELET - Abnormal; Notable for the following components:   WBC 2.5 (*)    RBC 3.68 (*)    Hemoglobin 10.8 (*)    HCT 32.6 (*)    RDW 18.9 (*)    nRBC 2.0 (*)    Lymphs Abs 0.2 (*)    All other components within normal limits  PROTIME-INR - Abnormal; Notable for the following components:   Prothrombin Time 15.8 (*)    INR 1.3 (*)    All other components within normal limits  SARS CORONAVIRUS 2 BY RT PCR (HOSPITAL ORDER, [place] [facility])  CULTURE, BLOOD (ROUTINE X 2)  CULTURE, BLOOD (ROUTINE X 2)  RESPIRATORY PANEL BY PCR  URINE CULTURE   LACTIC ACID, PLASMA  URINALYSIS, ROUTINE W REFLEX MICROSCOPIC  LACTIC ACID, PLASMA                                                                                                                         EKG  EKG Interpretation  Date/Time:    Ventricular Rate:    PR Interval:    QRS Duration:   QT Interval:    QTC Calculation:   R Axis:     Text Interpretation:        Radiology DG Chest 2 View  Result Date: 12/16/2019 CLINICAL DATA:  Suspected sepsis. EXAM: CHEST - 2 VIEW COMPARISON:  11/06/2019.  CT 10/10/2019 FINDINGS: Right chest port with tip in the mid SVC. Normal heart size and mediastinal contours. Coronary stent. No focal airspace disease. No pleural effusion. No pneumothorax. No pulmonary edema. Degenerative change of the right shoulder. IMPRESSION: No acute chest finding.  Right chest port in place. Electronically Signed   By: Keith Rake M.D.   On: 12/16/2019 01:32    Pertinent labs & imaging results that were available during my care of the patient were reviewed by me and considered in my medical decision making (see chart for details).  Medications Ordered in ED Medications  potassium chloride SA (KLOR-CON) CR tablet 40 mEq (has no administration in time range)  potassium chloride 10 mEq in 100 mL IVPB (has no administration in time range)  vancomycin (VANCOREADY) IVPB 2000 mg/400 mL (has no administration in time range)  meropenem (MERREM) 1 g in sodium chloride 0.9 % 100 mL IVPB (has no administration in time range)  acetaminophen (TYLENOL) tablet 500 mg (500 mg Oral Given 12/16/19 0132)  Procedures Procedures  (including critical care time)  Medical Decision Making / ED Course I have reviewed the nursing notes for this encounter and the patient's prior records (if available in EHR or on provided paperwork).   John Douglas  was evaluated in Emergency Department on 12/16/2019 for the symptoms described in the history of present illness. He was evaluated in the context of the global COVID-19 pandemic, which necessitated consideration that the patient might be at risk for infection with the SARS-CoV-2 virus that causes COVID-19. Institutional protocols and algorithms that pertain to the evaluation of patients at risk for COVID-19 are in a state of rapid change based on information released by regulatory bodies including the CDC and federal and state organizations. These policies and algorithms were followed during the patient's care in the ED.    Clinical Course as of Dec 15 401  Wed Dec 16, 2019  0120 Fever w/o obvious bacterial source at this time.   He is febrile and tachycardic, but actually well appearing and well hydrated. Nontoxic.  Infectious work up initiated.   [PC]  5956 CXR w/o PNA or evidence of bronchitis  CBC w/o leukocytosis or neutropenia. UA negative. CMP notable for mild AKI and hypoK+. Lactic wnl.  Patient does not meet septic criteria at this time.  Given PO K+.  Currently pending COVID and blood cultures.   [PC]  0401 COVID negative.  Discussed case with oncology, who requested admission for IV Abx until culture results. Agreed with adding RVP.  Will admit to medicine.   [PC]    Clinical Course User Index [PC] Kamauri Kathol, Grayce Sessions, MD     Final Clinical Impression(s) / ED Diagnoses Final diagnoses:  Fever in adult  Patient on antineoplastic chemotherapy regimen      This chart was dictated using voice recognition software.  Despite best efforts to proofread,  errors can occur which can change the documentation meaning.   Fatima Blank, MD 12/16/19 [phone]

## 2019-12-16 NOTE — ED Triage Notes (Signed)
Tookk 500mg  Tylenol PTA

## 2019-12-16 NOTE — Progress Notes (Signed)
[place] for Heparin Indication: atrial fibrillation  Allergies  Allergen Reactions  . Ibuprofen Other (See Comments)    Was told to not take this   . Metformin And Related Other (See Comments)    Bloody stools   . Naproxen Other (See Comments)    Was told to not take this   . Yellow Jacket Venom [Bee Venom] Swelling and Other (See Comments)    Severe swelling where stung  . Penicillins Hives    Did it involve swelling of the face/tongue/throat, SOB, or low BP? Unk Did it involve sudden or severe rash/hives, skin peeling, or any reaction on the inside of your mouth or nose? Yes Did you need to seek medical attention at a hospital or doctor's office? Unk When did it last happen?"Childhood- 55 years ago" If all above answers are "NO", may proceed with cephalosporin use.   . Celexa [Citalopram]     Contraindicated with A-Fib    Patient Measurements: Height: 5\' 10"  (177.8 cm) Weight: 93 kg (205 lb 0.4 oz) IBW/kg (Calculated) : 73 Heparin Dosing Weight: 90 kg  Vital Signs: Temp: 101.2 F (38.4 C) (08/18 1902) Temp Source: Oral (08/18 1743) BP: 128/79 (08/18 1739)  Labs: Recent Labs    12/16/19 0110 12/16/19 1811  HGB 10.8*  --   HCT 32.6*  --   PLT 214  --   LABPROT 15.8*  --   INR 1.3*  --   HEPARINUNFRC  --  <0.10*  CREATININE 1.25*  --     Estimated Creatinine Clearance: 67.5 mL/min (A) (by C-G formula based on SCr of 1.25 mg/dL (H)).   Medical History: Past Medical History:  Diagnosis Date  . Allergic rhinitis    [facility]  . Anxiety   . Atrial fibrillation ([place])    [facility]  . Benign essential hypertension    [facility]  . Coronary artery disease    [facility]  . Diabetic neuropathy, type II diabetes mellitus ([place])    [facility]  . Dysrhythmia   . Iron deficiency anemia due to chronic blood loss  11/18/2019  . Mixed hyperlipidemia    [facility]  . Myocardial infarct [facility])    [facility]  . Obesity, unspecified    [facility]  . Rectal bleeding    [facility]  . 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 Stroke ([place])   . Wide-complex tachycardia ([place]) 06/04/2019    Medications:  No current facility-administered medications on file prior to encounter.   Current Outpatient Medications on File Prior to Encounter  Medication Sig Dispense Refill  . acetaminophen (TYLENOL) 500 MG tablet Take 500 mg by mouth every 6 (six) hours as needed for fever.    . clonazePAM (KLONOPIN) 1 MG disintegrating tablet Take 1 tablet (1 mg total) by mouth 2 (two) times daily as needed. (Patient taking differently: Take 1 mg by mouth 2 (two) times daily as needed (for anxiety). ) 30 tablet 0  . clopidogrel (PLAVIX) 75 MG tablet Take 1 tablet (75 mg total) by mouth daily. 30 tablet 0  . diltiazem (CARDIZEM CD) 180 MG 24 hr capsule TAKE 1 CAPSULE BY MOUTH EVERY DAY (Patient taking differently: Take 180 mg by mouth daily. ) 30 capsule 1  . ezetimibe (ZETIA) 10 MG tablet Take 10 mg by mouth at bedtime.    Marland Kitchen  fluticasone (FLONASE) 50 MCG/ACT nasal spray Place 1-2 sprays into both nostrils as needed (for seasonal allergies).     . gabapentin (NEURONTIN) 100 MG capsule TAKE 1 CAPSULE BY MOUTH AT BEDTIME. (Patient taking differently: Take 100 mg by mouth at bedtime. ) 30 capsule 3  . glimepiride (AMARYL) 2 MG tablet Take 1 tablet (2 mg total) by mouth daily with breakfast. 30 tablet 0  . metoprolol succinate (TOPROL XL) 100 MG 24 hr tablet Take 1 tablet (100 mg total) by mouth in the morning and at bedtime. 60 tablet 3  . nitroGLYCERIN (NITROSTAT) 0.4 MG SL tablet Place 0.4 mg under the tongue every 5 (five) minutes as needed for chest pain.     Marland Kitchen ondansetron (ZOFRAN) 8 MG tablet Take 1 tablet (8 mg total) by  mouth 2 (two) times daily as needed for refractory nausea / vomiting. Start on day 3 after chemotherapy. 30 tablet 1  . prochlorperazine (COMPAZINE) 10 MG tablet Take 1 tablet (10 mg total) by mouth every 6 (six) hours as needed (Nausea or vomiting). (Patient taking differently: Take 10 mg by mouth every 6 (six) hours as needed for nausea or vomiting. ) 30 tablet 1  . rosuvastatin (CRESTOR) 20 MG tablet Take 1 tablet (20 mg total) by mouth daily. (Patient taking differently: Take 20 mg by mouth at bedtime. ) 30 tablet 0  . sertraline (ZOLOFT) 50 MG tablet Take 50 mg by mouth daily.    . traZODone (DESYREL) 50 MG tablet TAKE 1 TABLET (50 MG TOTAL) BY MOUTH AT BEDTIME AS NEEDED FOR SLEEP. 30 tablet 3  . acetaminophen (TYLENOL) 325 MG tablet Take 1-2 tablets (325-650 mg total) by mouth every 4 (four) hours as needed for mild pain. (Patient not taking: Reported on 12/16/2019)    . glucose blood test strip Monitor BS before meals and at bedtime for a couple of weeks. Then may decrease to bid before meals 100 each 12  . Lancets (ONETOUCH ULTRASOFT) lancets Use as instructed 100 each 12  . lidocaine-prilocaine (EMLA) cream Apply to affected area once (Patient not taking: Reported on 12/16/2019) 30 g 3  . LORazepam (ATIVAN) 0.5 MG tablet Take 1 tablet (0.5 mg total) by mouth every 6 (six) hours as needed (Nausea or vomiting). (Patient not taking: Reported on 12/16/2019) 30 tablet 0     Assessment: 64 y.o. male with Afib on heparin (not on anticoagulation PTA) -initial level is undectable  Goal of Therapy:  Heparin level 0.3-0.7 units/ml Monitor platelets by anticoagulation protocol: Yes   Plan:  -Heparin 2000 units IV bolus then increase infusion to 1650 units/hr -Heparin level in 6 hours and daily wth CBC daily  Hildred Laser, PharmD Clinical Pharmacist **Pharmacist phone directory can now be found on [email or web address] ([email or web address]).  Listed under [place].

## 2019-12-16 NOTE — Progress Notes (Signed)
PHARMACY - PHYSICIAN COMMUNICATION CRITICAL VALUE ALERT - BLOOD CULTURE IDENTIFICATION (BCID)  John Douglas is an 64 y.o. male who presented to [facility] on [facility]16/2019 with a chief complaint of fever and chills. Concern for febrile neutropenia given leukopenia and recent start on chemo for rectal cancer. Patient's blood cultures now growing E. Coli without any resistance detected on BCID panel. Unclear source at this time, but patient did have recent port placement at the end of July.  Assessment:   BCx 3/3 bottles growing E. Coli without resistance detected  Name of physician (or Provider) Contacted: Dr. [place]  Current antibiotics: Vancomycin and meropenem  Changes to prescribed antibiotics recommended:  Recommendations accepted by provider  Discontinue vancomycin Discontinue meropenem Initiate ceftriaxone  Results for orders placed or performed during the hospital encounter of 12/16/19  Blood Culture ID Panel (Reflexed) (Collected: 12/16/2019 12:45 AM)  Result Value Ref Range   Enterococcus faecalis NOT DETECTED NOT DETECTED   Enterococcus Faecium NOT DETECTED NOT DETECTED   Listeria monocytogenes NOT DETECTED NOT DETECTED   Staphylococcus species NOT DETECTED NOT DETECTED   Staphylococcus aureus (BCID) NOT DETECTED NOT DETECTED   Staphylococcus epidermidis NOT DETECTED NOT DETECTED   Staphylococcus lugdunensis NOT DETECTED NOT DETECTED   Streptococcus species NOT DETECTED NOT DETECTED   Streptococcus agalactiae NOT DETECTED NOT DETECTED   Streptococcus pneumoniae NOT DETECTED NOT DETECTED   Streptococcus pyogenes NOT DETECTED NOT DETECTED   A.calcoaceticus-baumannii NOT DETECTED NOT DETECTED   Bacteroides fragilis NOT DETECTED NOT DETECTED   Enterobacterales DETECTED (A) NOT DETECTED   Enterobacter cloacae complex NOT DETECTED NOT DETECTED   Escherichia coli DETECTED (A) NOT DETECTED   Klebsiella aerogenes NOT DETECTED NOT DETECTED   Klebsiella oxytoca NOT DETECTED NOT  DETECTED   Klebsiella pneumoniae NOT DETECTED NOT DETECTED   Proteus species NOT DETECTED NOT DETECTED   Salmonella species NOT DETECTED NOT DETECTED   Serratia marcescens NOT DETECTED NOT DETECTED   Haemophilus influenzae NOT DETECTED NOT DETECTED   Neisseria meningitidis NOT DETECTED NOT DETECTED   Pseudomonas aeruginosa NOT DETECTED NOT DETECTED   Stenotrophomonas maltophilia NOT DETECTED NOT DETECTED   Candida albicans NOT DETECTED NOT DETECTED   Candida auris NOT DETECTED NOT DETECTED   Candida glabrata NOT DETECTED NOT DETECTED   Candida krusei NOT DETECTED NOT DETECTED   Candida parapsilosis NOT DETECTED NOT DETECTED   Candida tropicalis NOT DETECTED NOT DETECTED   Cryptococcus neoformans/gattii NOT DETECTED NOT DETECTED   CTX-M ESBL NOT DETECTED NOT DETECTED   Carbapenem resistance IMP NOT DETECTED NOT DETECTED   Carbapenem resistance KPC NOT DETECTED NOT DETECTED   Carbapenem resistance NDM NOT DETECTED NOT DETECTED   Carbapenem resist OXA 48 LIKE NOT DETECTED NOT DETECTED   Carbapenem resistance VIM NOT DETECTED NOT DETECTED    Alfonse Spruce, PharmD PGY2 ID Pharmacy Resident [phone]  12/16/2019  3:03 PM

## 2019-12-17 ENCOUNTER — Observation Stay (HOSPITAL_COMMUNITY): Payer: Medicare HMO

## 2019-12-17 DIAGNOSIS — I2510 Atherosclerotic heart disease of native coronary artery without angina pectoris: Secondary | ICD-10-CM | POA: Diagnosis present

## 2019-12-17 DIAGNOSIS — E876 Hypokalemia: Secondary | ICD-10-CM | POA: Diagnosis present

## 2019-12-17 DIAGNOSIS — Z20822 Contact with and (suspected) exposure to covid-19: Secondary | ICD-10-CM | POA: Diagnosis present

## 2019-12-17 DIAGNOSIS — E782 Mixed hyperlipidemia: Secondary | ICD-10-CM | POA: Diagnosis present

## 2019-12-17 DIAGNOSIS — C775 Secondary and unspecified malignant neoplasm of intrapelvic lymph nodes: Secondary | ICD-10-CM | POA: Diagnosis present

## 2019-12-17 DIAGNOSIS — D701 Agranulocytosis secondary to cancer chemotherapy: Secondary | ICD-10-CM | POA: Diagnosis present

## 2019-12-17 DIAGNOSIS — K5732 Diverticulitis of large intestine without perforation or abscess without bleeding: Secondary | ICD-10-CM | POA: Diagnosis present

## 2019-12-17 DIAGNOSIS — Z888 Allergy status to other drugs, medicaments and biological substances status: Secondary | ICD-10-CM | POA: Diagnosis not present

## 2019-12-17 DIAGNOSIS — A4151 Sepsis due to Escherichia coli [E. coli]: Secondary | ICD-10-CM | POA: Diagnosis present

## 2019-12-17 DIAGNOSIS — B9620 Unspecified Escherichia coli [E. coli] as the cause of diseases classified elsewhere: Secondary | ICD-10-CM | POA: Diagnosis present

## 2019-12-17 DIAGNOSIS — Z880 Allergy status to penicillin: Secondary | ICD-10-CM | POA: Diagnosis not present

## 2019-12-17 DIAGNOSIS — T451X5A Adverse effect of antineoplastic and immunosuppressive drugs, initial encounter: Secondary | ICD-10-CM | POA: Diagnosis present

## 2019-12-17 DIAGNOSIS — Z886 Allergy status to analgesic agent status: Secondary | ICD-10-CM | POA: Diagnosis not present

## 2019-12-17 DIAGNOSIS — I480 Paroxysmal atrial fibrillation: Secondary | ICD-10-CM | POA: Diagnosis present

## 2019-12-17 DIAGNOSIS — I69351 Hemiplegia and hemiparesis following cerebral infarction affecting right dominant side: Secondary | ICD-10-CM | POA: Diagnosis not present

## 2019-12-17 DIAGNOSIS — Z79899 Other long term (current) drug therapy: Secondary | ICD-10-CM | POA: Diagnosis not present

## 2019-12-17 DIAGNOSIS — R7881 Bacteremia: Secondary | ICD-10-CM | POA: Diagnosis present

## 2019-12-17 DIAGNOSIS — F419 Anxiety disorder, unspecified: Secondary | ICD-10-CM | POA: Diagnosis present

## 2019-12-17 DIAGNOSIS — I252 Old myocardial infarction: Secondary | ICD-10-CM | POA: Diagnosis not present

## 2019-12-17 DIAGNOSIS — Z91030 Bee allergy status: Secondary | ICD-10-CM | POA: Diagnosis not present

## 2019-12-17 DIAGNOSIS — R509 Fever, unspecified: Secondary | ICD-10-CM | POA: Diagnosis not present

## 2019-12-17 DIAGNOSIS — Z7902 Long term (current) use of antithrombotics/antiplatelets: Secondary | ICD-10-CM | POA: Diagnosis not present

## 2019-12-17 DIAGNOSIS — Z1623 Resistance to quinolones and fluoroquinolones: Secondary | ICD-10-CM | POA: Diagnosis present

## 2019-12-17 DIAGNOSIS — I10 Essential (primary) hypertension: Secondary | ICD-10-CM | POA: Diagnosis present

## 2019-12-17 DIAGNOSIS — E1140 Type 2 diabetes mellitus with diabetic neuropathy, unspecified: Secondary | ICD-10-CM | POA: Diagnosis present

## 2019-12-17 DIAGNOSIS — Z9109 Other allergy status, other than to drugs and biological substances: Secondary | ICD-10-CM | POA: Diagnosis not present

## 2019-12-17 DIAGNOSIS — C19 Malignant neoplasm of rectosigmoid junction: Secondary | ICD-10-CM | POA: Diagnosis present

## 2019-12-17 LAB — BASIC METABOLIC PANEL
Anion gap: 6 (ref 5–15)
BUN: 17 mg/dL (ref 8–23)
CO2: 27 mmol/L (ref 22–32)
Calcium: 8.3 mg/dL — ABNORMAL LOW (ref 8.9–10.3)
Chloride: 103 mmol/L (ref 98–111)
Creatinine, Ser: 1.21 mg/dL (ref 0.61–1.24)
GFR calc Af Amer: >60 mL/min (ref >60)
GFR calc non Af Amer: >60 mL/min (ref >60)
Glucose, Bld: 144 mg/dL — ABNORMAL HIGH (ref 70–99)
Potassium: 3.7 mmol/L (ref 3.5–5.1)
Sodium: 136 mmol/L (ref 135–145)

## 2019-12-17 LAB — GLUCOSE, CAPILLARY
Glucose-Capillary: 101 mg/dL — ABNORMAL HIGH (ref 70–99)
Glucose-Capillary: 109 mg/dL — ABNORMAL HIGH (ref 70–99)
Glucose-Capillary: 184 mg/dL — ABNORMAL HIGH (ref 70–99)
Glucose-Capillary: 138 mg/dL — ABNORMAL HIGH (ref 70–99)

## 2019-12-17 LAB — CBC
HCT: 27.1 % — ABNORMAL LOW (ref 39.0–52.0)
Hemoglobin: 9.1 g/dL — ABNORMAL LOW (ref 13.0–17.0)
MCH: 30 pg (ref 26.0–34.0)
MCHC: 33.6 g/dL (ref 30.0–36.0)
MCV: 89.4 fL (ref 80.0–100.0)
Platelets: 163 10*3/uL (ref 150–400)
RBC: 3.03 MIL/uL — ABNORMAL LOW (ref 4.22–5.81)
RDW: 19.8 % — ABNORMAL HIGH (ref 11.5–15.5)
WBC: 8.3 10*3/uL (ref 4.0–10.5)
nRBC: 0 % (ref 0.0–0.2)

## 2019-12-17 LAB — URINE CULTURE: Culture: NO GROWTH

## 2019-12-17 LAB — HEPARIN LEVEL (UNFRACTIONATED)
Heparin Unfractionated: 0.14 IU/mL — ABNORMAL LOW (ref 0.30–0.70)
Heparin Unfractionated: 0.16 IU/mL — ABNORMAL LOW (ref 0.30–0.70)

## 2019-12-17 LAB — MAGNESIUM: Magnesium: 2 mg/dL (ref 1.7–2.4)

## 2019-12-17 MED ORDER — METRONIDAZOLE IN NACL 5-0.79 MG/ML-% IV SOLN
500.0000 mg | Freq: Three times a day (TID) | INTRAVENOUS | Status: DC
  Administered 2019-12-17 – 2019-12-18 (×3): 500 mg via INTRAVENOUS
  Filled 2019-12-17 (×3): qty 100

## 2019-12-17 MED ORDER — IOHEXOL 300 MG/ML  SOLN
100.0000 mL | Freq: Once | INTRAMUSCULAR | Status: AC | PRN
  Administered 2019-12-17: 100 mL via INTRAVENOUS

## 2019-12-17 MED ORDER — METOPROLOL SUCCINATE ER 100 MG PO TB24
100.0000 mg | ORAL_TABLET | Freq: Two times a day (BID) | ORAL | Status: DC
  Administered 2019-12-17 – 2019-12-19 (×4): 100 mg via ORAL
  Filled 2019-12-17 (×4): qty 1

## 2019-12-17 MED ORDER — TBO-FILGRASTIM 480 MCG/0.8ML ~~LOC~~ SOSY
480.0000 ug | PREFILLED_SYRINGE | Freq: Once | SUBCUTANEOUS | Status: AC
  Administered 2019-12-17: 480 ug via SUBCUTANEOUS
  Filled 2019-12-17: qty 0.8

## 2019-12-17 NOTE — Progress Notes (Signed)
PROGRESS NOTE    Hagen Bohorquez  WCH:[id number] DOB: 1955-09-12 DOA: 12/16/2019 PCP: Leonides Sake, MD    Brief Narrative:  John Douglas is a 64 y.o. male with medical history significant for rectal cancer recently started on chemotherapy, hypertension, CAD, previous MI,  diabetes mellitus, previous stroke who presents for evaluation of fever and chills.  He reports he went to dinner with some friends as and on the way home he developed chills and when he got home his temperature was 101 degrees.  His daughter was with him and became concerned and called her oncologist to advised him to come to the emergency room for evaluation. When he arrived in the emergency room temperature was 103 degrees. He continued to have chills but did not have any cough, chest pain, palpitations, nausea, vomiting, diarrhea, abdominal pain, urinary frequency or dysuria.  He has not had any rash.  He denies any known sick contacts. He did have his COVID-19 vaccine in June.  Reports he has never had DVT or PE and has not had any prolonged immobilization or recent travel.   In the ED, temperature 103.  Given Tylenol and fever has broken.  Noted to have decreased white blood cell count with normal neutrophil value.  Chest x-ray did not reveal any pneumonia.  Urinalysis is negative for infection.  No source of fever identified.  ER provider discussed with oncology who recommended patient stay for empiric antibiotics while cultures are pending.   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 coli septicemia Acute sigmoid diverticulitis Patient presenting to the ED with chills and fever.  Reports was having dinner when driving home with symptom  onset.  Temperature 103.1 on presentation.  Lactic acid mildly elevated at 1.5.  Patient with leukopenia without neutropenia, likely secondary to active chemotherapy.  Covid-19 PCR negative.  Respiratory viral panel negative.  Chest x-ray with no acute cardiopulmonary disease process.  Urinalysis unrevealing and urine culture with no growth.  CT abdomen/pelvis notable for moderate sigmoid diverticulitis without perforation or abscess. --Blood cultures x 2 positive for GNR's with BCID = Ecoli --ceftriaxone 2 g IV every 24 hours --Metronidazole 5 mg IV every 8 hours --Await further blood culture susceptibilities --Continue to monitor fever curve --Antipyretics as needed  Hypokalemia Potassium 2.7 on admission with magnesium 1.8.  Repleted. --Monitor electrolytes closely daily  Leukopenia secondary to antineoplastic chemotherapy WBC count of 2.5; neutrophils 87; no neutropenia --Continue monitor CBC daily  Mucosal changes distal esophagus Incidental finding of mildly thickened distal esophagus on CT angiogram chest.  Recommend outpatient follow-up/referral to gastroenterology for consideration of EGD for further evaluation.  Paroxysmal atrial fibrillation Essential hypertension --Continue diltiazem 180 mg p.o. daily and metoprolol succinate 100 mg p.o. daily --Currently not on anticoagulation per oncology  Type 2 diabetes mellitus Hemoglobin A1c 8.0 on 10/09/2019.  On Amaryl 2 mg p.o. daily outpatient. --Continue to hold oral hypoglycemics while inpatient --Insulin sliding scale for coverage --CBGs before every meal/at bedtime  CAD --Continue Plavix 75 mg p.o. daily --Continue statin  HLD: Continue Crestor 20 mg p.o. daily and Zetia 10 mg p.o. 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 Marin Olp.  Has completed 2 courses of chemotherapy, last chemotherapy 1 week prior. --Dr. Marin Olp following, appreciate his  assistance --Continue outpatient follow-up with oncology   DVT prophylaxis: Heparin drip Code Status: Full code Family Communication: No family present at bedside this morning  Disposition Plan:  Status is: Inpatient  Remains inpatient appropriate because:Ongoing diagnostic testing needed not appropriate for outpatient work up, Unsafe d/c plan, IV treatments appropriate due to intensity of illness or inability to take PO and Inpatient level of care appropriate due to severity of illness continues to be febrile on IV antibiotics awaiting culture sensitivities   Dispo: The patient is from: Home              Anticipated d/c is to: Home once fever free x24 hours              Anticipated d/c date is: 2 days              Patient currently is not medically stable to d/c.    Consultants:   Medical oncology -Dr. Marin Olp  Procedures:   None  Antimicrobials:   Vancomycin 8/18 - 8/18  Meropenem 8/18 - 8/18  Ceftriaxone 8/18>>  Metronidazole 8/19>>   Subjective: Patient seen and examined bedside, resting comfortably.  Continued with fever overnight up to 103.0.  Blood cultures with E. coli and CT abdomen/pelvis with acute diverticulitis.  No other complaints or concerns at this time. Denies headache, no current fever, no current chills/night sweats, no nausea/vomiting/diarrhea, no chest pain, palpitations, no shortness of breath, no abdominal pain, no weakness, no fatigue, no paresthesias.  No acute events overnight per nursing staff.  Objective: Vitals:   12/16/19 1902 12/17/19 0133 12/17/19 0344 12/17/19 0545  BP:  139/86  134/86  Pulse:  (!) 111 92 94  Resp:  18  18  Temp: (!) 101.2 F (38.4 C) (!) 102.8 F (39.3 C) 98.9 F (37.2 C) 98.4 F (36.9 C)  TempSrc:  Oral Oral Oral  SpO2:    95%  Weight:      Height:        Intake/Output Summary (Last 24 hours) at 12/17/2019 1259 Last data filed at 12/17/2019 1012 Gross per 24 hour  Intake 890.49 ml  Output --  Net  890.49 ml   Filed Weights   12/16/19 0055  Weight: 93 kg    Examination:  General exam: Appears calm and comfortable  Respiratory system: Clear to auscultation. Respiratory effort normal. Cardiovascular system: S1 & S2 heard, RRR. No JVD, murmurs, rubs, gallops or clicks. No pedal edema. Gastrointestinal system: Abdomen is nondistended, soft and nontender. No organomegaly or masses felt. Normal bowel sounds heard. Central nervous system: Alert and oriented. No focal neurological deficits. Extremities: Symmetric 5 x 5 power. Skin: No rashes, lesions or ulcers Psychiatry: Judgement and insight appear normal. Mood & affect appropriate.     Data Reviewed: I have personally reviewed following labs and imaging studies  CBC: Recent Labs  Lab 12/16/19 0110 12/17/19 0818  WBC 2.5* 8.3  NEUTROABS 2.2  --   HGB 10.8* 9.1*  HCT 32.6* 27.1*  MCV 88.6 89.4  PLT 214 726   Basic Metabolic Panel: Recent Labs  Lab 12/16/19 0110 12/16/19 0756 12/17/19 0818  NA 138  --  136  K 2.7* 4.2 3.7  CL 106  --  103  CO2 24  --  27  GLUCOSE 141*  --  144*  BUN 22  --  17  CREATININE 1.25*  --  1.21  CALCIUM 8.7*  --  8.3*  MG 1.8 1.9 2.0   GFR: Estimated Creatinine Clearance: 69.7 mL/min (by C-G formula based on SCr of 1.21 mg/dL). Liver Function Tests: Recent Labs  Lab 12/16/19 0110  AST 18  ALT 18  ALKPHOS 45  BILITOT 1.3*  PROT 6.0*  ALBUMIN 3.6   No results for input(s): LIPASE, AMYLASE in the last 168 hours. No results for input(s): AMMONIA in the last 168 hours. Coagulation Profile: Recent Labs  Lab 12/16/19 0110  INR 1.3*   Cardiac Enzymes: No results for input(s): CKTOTAL, CKMB, CKMBINDEX, TROPONINI in the last 168 hours. BNP (last 3 results) No results for input(s): PROBNP in the last 8760 hours. HbA1C: No results for input(s): HGBA1C in the last 72 hours. CBG: Recent Labs  Lab 12/16/19 2142 12/17/19 0621 12/17/19 1109  GLUCAP 92 101* 109*   Lipid  Profile: No results for input(s): CHOL, HDL, LDLCALC, TRIG, CHOLHDL, LDLDIRECT in the last 72 hours. Thyroid Function Tests: No results for input(s): TSH, T4TOTAL, FREET4, T3FREE, THYROIDAB in the last 72 hours. Anemia Panel: No results for input(s): VITAMINB12, FOLATE, FERRITIN, TIBC, IRON, RETICCTPCT in the last 72 hours. Sepsis Labs: Recent Labs  Lab 12/16/19 0110  LATICACIDVEN 1.5    Recent Results (from the past 240 hour(s))  Culture, blood (Routine x 2)     Status: Abnormal (Preliminary result)   Collection Time: 12/16/19 12:45 AM   Specimen: BLOOD LEFT ARM  Result Value Ref Range Status   Specimen Description BLOOD LEFT ARM  Final   Special Requests   Final    BOTTLES DRAWN AEROBIC AND ANAEROBIC Blood Culture adequate volume   Culture  Setup Time   Final    GRAM NEGATIVE RODS IN BOTH AEROBIC AND ANAEROBIC BOTTLES Organism ID to follow    Culture (A)  Final    ESCHERICHIA COLI SUSCEPTIBILITIES TO FOLLOW Performed at [facility], [place] [address] [place], [place] [address]    Report Status PENDING  Incomplete  Blood Culture ID Panel (Reflexed)     Status: Abnormal   Collection Time: 12/16/19 12:45 AM  Result Value Ref Range Status   Enterococcus faecalis NOT DETECTED NOT DETECTED Final   Enterococcus Faecium NOT DETECTED NOT DETECTED Final   Listeria monocytogenes NOT DETECTED NOT DETECTED Final   Staphylococcus species NOT DETECTED NOT DETECTED Final   Staphylococcus aureus (BCID) NOT DETECTED NOT DETECTED Final   Staphylococcus epidermidis NOT DETECTED NOT DETECTED Final   Staphylococcus lugdunensis NOT DETECTED NOT DETECTED Final   Streptococcus species NOT DETECTED NOT DETECTED Final   Streptococcus agalactiae NOT DETECTED NOT DETECTED Final   Streptococcus pneumoniae NOT DETECTED NOT DETECTED Final   Streptococcus pyogenes NOT DETECTED NOT DETECTED Final   A.calcoaceticus-baumannii NOT DETECTED NOT DETECTED Final   Bacteroides fragilis NOT DETECTED NOT  DETECTED Final   Enterobacterales DETECTED (A) NOT DETECTED Final    Comment: Enterobacterales represent a large order of gram negative bacteria, not a single organism. CRITICAL RESULT CALLED TO, READ BACK BY AND VERIFIED WITH: PHARMD AMANADA W. [id number] FCP    Enterobacter cloacae complex NOT DETECTED NOT DETECTED Final   Escherichia coli DETECTED (A) NOT DETECTED Final    Comment: CRITICAL RESULT CALLED TO, READ BACK BY AND VERIFIED WITH: PHARMD AMANADA W. [id number] FCP    Klebsiella aerogenes NOT DETECTED NOT DETECTED Final   Klebsiella oxytoca NOT DETECTED NOT DETECTED Final   Klebsiella pneumoniae NOT DETECTED NOT DETECTED Final   Proteus species  NOT DETECTED NOT DETECTED Final   Salmonella species NOT DETECTED NOT DETECTED Final   Serratia marcescens NOT DETECTED NOT DETECTED Final   Haemophilus influenzae NOT DETECTED NOT DETECTED Final   Neisseria meningitidis NOT DETECTED NOT DETECTED Final   Pseudomonas aeruginosa NOT DETECTED NOT DETECTED Final   Stenotrophomonas maltophilia NOT DETECTED NOT DETECTED Final   Candida albicans NOT DETECTED NOT DETECTED Final   Candida auris NOT DETECTED NOT DETECTED Final   Candida glabrata NOT DETECTED NOT DETECTED Final   Candida krusei NOT DETECTED NOT DETECTED Final   Candida parapsilosis NOT DETECTED NOT DETECTED Final   Candida tropicalis NOT DETECTED NOT DETECTED Final   Cryptococcus neoformans/gattii NOT DETECTED NOT DETECTED Final   CTX-M ESBL NOT DETECTED NOT DETECTED Final   Carbapenem resistance IMP NOT DETECTED NOT DETECTED Final   Carbapenem resistance KPC NOT DETECTED NOT DETECTED Final   Carbapenem resistance NDM NOT DETECTED NOT DETECTED Final   Carbapenem resist OXA 48 LIKE NOT DETECTED NOT DETECTED Final   Carbapenem resistance VIM NOT DETECTED NOT DETECTED Final    Comment: Performed at [facility], [address] [place], [place] [address]  Culture, blood (Routine x 2)     Status: None (Preliminary result)    Collection Time: 12/16/19  1:08 AM   Specimen: BLOOD LEFT HAND  Result Value Ref Range Status   Specimen Description BLOOD LEFT HAND  Final   Special Requests   Final    BOTTLES DRAWN AEROBIC ONLY Blood Culture results may not be optimal due to an excessive volume of blood received in culture bottles   Culture  Setup Time   Final    GRAM NEGATIVE RODS AEROBIC BOTTLE ONLY CRITICAL RESULT CALLED TO, READ BACK BY AND VERIFIED WITH: Leonor Liv [id number] FCP Performed at [facility], [place] [address], [place], [place] [address]    Culture GRAM NEGATIVE RODS  Final   Report Status PENDING  Incomplete  SARS Coronavirus 2 by RT PCR (hospital order, performed in [facility] lab) Nasopharyngeal Nasopharyngeal Swab     Status: None   Collection Time: 12/16/19  1:25 AM   Specimen: Nasopharyngeal Swab  Result Value Ref Range Status   SARS Coronavirus 2 NEGATIVE NEGATIVE Final    Comment: (NOTE) SARS-CoV-2 target nucleic acids are NOT DETECTED.  The SARS-CoV-2 RNA is generally detectable in upper and lower respiratory specimens during the acute phase of infection. The lowest concentration of SARS-CoV-2 viral copies this assay can detect is 250 copies / mL. A negative result does not preclude SARS-CoV-2 infection and should not be used as the sole basis for treatment or other patient management decisions.  A negative result may occur with improper specimen collection / handling, submission of specimen other than nasopharyngeal swab, presence of viral mutation(s) within the areas targeted by this assay, and inadequate number of viral copies (<250 copies / mL). A negative result must be combined with clinical observations, patient history, and epidemiological information.  Fact Sheet for Patients:   StrictlyIdeas.no  Fact Sheet for Healthcare Providers: BankingDealers.co.za  This test is not yet approved or  cleared by the  [place] FDA and has been authorized for detection and/or diagnosis of SARS-CoV-2 by FDA under an Emergency Use Authorization (EUA).  This EUA will remain in effect (meaning this test can be used) for the duration of the COVID-19 declaration under Section 564(b)(1) of the Act, 21 U.S.C. section 360bbb-3(b)(1), unless the authorization is terminated or  revoked sooner.  Performed at [facility], [place] [address], [place], [place] [address]   Respiratory Panel by PCR     Status: None   Collection Time: 12/16/19  4:32 AM   Specimen: Nasopharyngeal Swab; Respiratory  Result Value Ref Range Status   Adenovirus NOT DETECTED NOT DETECTED Final   Coronavirus 229E NOT DETECTED NOT DETECTED Final    Comment: (NOTE) The Coronavirus on the Respiratory Panel, DOES NOT test for the novel  Coronavirus (2019 nCoV)    Coronavirus HKU1 NOT DETECTED NOT DETECTED Final   Coronavirus NL63 NOT DETECTED NOT DETECTED Final   Coronavirus OC43 NOT DETECTED NOT DETECTED Final   Metapneumovirus NOT DETECTED NOT DETECTED Final   Rhinovirus / Enterovirus NOT DETECTED NOT DETECTED Final   Influenza A NOT DETECTED NOT DETECTED Final   Influenza B NOT DETECTED NOT DETECTED Final   Parainfluenza Virus 1 NOT DETECTED NOT DETECTED Final   Parainfluenza Virus 2 NOT DETECTED NOT DETECTED Final   Parainfluenza Virus 3 NOT DETECTED NOT DETECTED Final   Parainfluenza Virus 4 NOT DETECTED NOT DETECTED Final   Respiratory Syncytial Virus NOT DETECTED NOT DETECTED Final   Bordetella pertussis NOT DETECTED NOT DETECTED Final   Chlamydophila pneumoniae NOT DETECTED NOT DETECTED Final   Mycoplasma pneumoniae NOT DETECTED NOT DETECTED Final    Comment: Performed at [facility], [place], [place], [place] [address]  Urine culture     Status: None   Collection Time: 12/16/19  5:23 AM   Specimen: In/Out Cath Urine  Result Value Ref Range Status   Specimen Description IN/OUT CATH URINE  Final   Special  Requests NONE  Final   Culture   Final    NO GROWTH Performed at [facility], [place] [address], [place], [place] [address]    Report Status 12/17/2019 FINAL  Final         Radiology Studies: DG Chest 2 View  Result Date: 12/16/2019 CLINICAL DATA:  Suspected sepsis. EXAM: CHEST - 2 VIEW COMPARISON:  11/06/2019.  CT 10/10/2019 FINDINGS: Right chest port with tip in the mid SVC. Normal heart size and mediastinal contours. Coronary stent. No focal airspace disease. No pleural effusion. No pneumothorax. No pulmonary edema. Degenerative change of the right shoulder. IMPRESSION: No acute chest finding.  Right chest port in place. Electronically Signed   By: Keith Rake M.D.   On: 12/16/2019 01:32   CT ANGIO CHEST PE W OR WO CONTRAST  Result Date: 12/16/2019 CLINICAL DATA:  Shortness of breath, fever for 3 days, history of cancer, recent stent placements as well. Stroke in the past year. Active colon cancer EXAM: CT ANGIOGRAPHY CHEST WITH CONTRAST TECHNIQUE: Multidetector CT imaging of the chest was performed using the standard protocol during bolus administration of intravenous contrast. Multiplanar CT image reconstructions and MIPs were obtained to evaluate the vascular anatomy. CONTRAST:  41mL OMNIPAQUE IOHEXOL 350 MG/ML SOLN COMPARISON:  CT 10/10/2019 FINDINGS: Cardiovascular: Satisfactory opacification of the pulmonary arteries though evaluation is limited by extensive respiratory and cardiac pulsation artifact. There is likely artifactual hypoattenuation in the lobar branch of the right middle lobe when viewed on coronal imaging without reliable correlate. No convincing central, lobar or proximal segmental filling defects are identified. Central pulmonary arteries are upper limits normal caliber. Mild cardiomegaly. Four-chamber cardiac enlargement is noted. Three-vessel native coronary artery disease with stents in the right coronary and LAD. No pericardial effusion. Atherosclerotic plaque  within the normal caliber aorta. No  acute luminal abnormality of the aorta. No periaortic stranding or hemorrhage. Normal 3 vessel branching of the aortic arch with atheromatous plaque in the otherwise unremarkable proximal great vessels. A right IJ approach Port-A-Cath reservoir in the right chest wall, tip terminates at the superior cavoatrial junction. No major venous abnormalities are seen. Mediastinum/Nodes: No mediastinal fluid or gas. Normal thyroid gland and thoracic inlet. No acute abnormality of the trachea. Slightly patulous and mildly thickened distal thoracic esophagus with small hiatal hernia. No worrisome mediastinal, hilar or axillary adenopathy. Lungs/Pleura: Respiratory motion artifact limits evaluation of the lung parenchyma. Some mild airways thickening is noted which could reflect some bronchitic change. No consolidation, features of edema, pneumothorax, or effusion. No suspicious pulmonary nodules or masses. Upper Abdomen: No acute abnormalities present in the visualized portions of the upper abdomen. Small accessory splenule. Hiatal hernia, as above. Musculoskeletal: Scoliotic curvature of the upper to midthoracic spine is similar to prior with some mildly exaggerated thoracic kyphotic curvature as well. Minimal degenerative changes in the spine similar to priors. No acute osseous abnormality or suspicious osseous lesion. No worrisome chest wall lesions. Right Port-A-Cath, as above. Review of the MIP images confirms the above findings. IMPRESSION: 1. Study is limited by extensive respiratory and cardiac pulsation artifact. 2. No convincing evidence of acute pulmonary embolism to the lobar level. 3. Mild airways thickening may reflect some bronchitic change. No other acute intrathoracic process. 4. Cardiomegaly with three-vessel native coronary artery disease and stents in the right coronary and LAD. 5. Small hiatal hernia with patulous and mildly thickened distal thoracic esophagus, correlate  for features of esophagitis and consider correlation with outpatient endoscopy. 6. Aortic Atherosclerosis (ICD10-I70.0). Electronically Signed   By: Lovena Le M.D.   On: 12/16/2019 05:58   CT ABDOMEN PELVIS W CONTRAST  Result Date: 12/17/2019 CLINICAL DATA:  Follow-up metastatic colon carcinoma. Ongoing chemotherapy. E coli bacteremia. EXAM: CT ABDOMEN AND PELVIS WITH CONTRAST TECHNIQUE: Multidetector CT imaging of the abdomen and pelvis was performed using the standard protocol following bolus administration of intravenous contrast. CONTRAST:  1108mL OMNIPAQUE IOHEXOL 300 MG/ML  SOLN COMPARISON:  10/10/2019 FINDINGS: Lower Chest: No acute findings. Hepatobiliary: No hepatic masses identified. Gallbladder is unremarkable. No evidence of biliary ductal dilatation. Pancreas:  No mass or inflammatory changes. Spleen: Within normal limits in size and appearance. Adrenals/Urinary Tract: No masses identified. A 3 mm calculus is again seen in the midpole of the right kidney. No evidence of ureteral calculi or hydronephrosis. Unremarkable unopacified urinary bladder. Stomach/Bowel: Moderate sigmoid diverticulitis is seen. No evidence of extraluminal air or abscess. Vascular/Lymphatic: Mild lymphadenopathy is again seen in the right retrocrural region, aortocaval and left paraaortic regions and left common iliac chain. Index lymph node in the aortocaval space currently measures 14 mm on image 35/3, compared to 16 mm previously. Another index lymph node in the left paraaortic region currently measures 9 mm on image 34/3, compared to 12 mm previously. Sub-centimeter perirectal lymph nodes are also decreased compared to MRI on 10/10/2019. No new or increased sites of lymphadenopathy identified. No abdominal aortic aneurysm. Aortic atherosclerosis noted. Reproductive:  No mass or other significant abnormality. Other:  None. Musculoskeletal:  No suspicious bone lesions identified. IMPRESSION: Moderate sigmoid  diverticulitis. No evidence of abscess or other complication. Mild decrease in mild abdominal and pelvic lymphadenopathy, consistent with improving metastatic disease. No new or progressive metastatic disease identified. Tiny right renal calculus. No evidence of ureteral calculi or hydronephrosis. Aortic Atherosclerosis (ICD10-I70.0). Electronically Signed   By: Linus Mako  Kris Hartmann M.D.   On: 12/17/2019 10:40        Scheduled Meds: . Chlorhexidine Gluconate Cloth  6 each Topical Daily  . clopidogrel  75 mg Oral Daily  . diltiazem  180 mg Oral Daily  . ezetimibe  10 mg Oral QHS  . insulin aspart  0-5 Units Subcutaneous QHS  . insulin aspart  0-9 Units Subcutaneous TID WC  . metoprolol succinate  100 mg Oral Daily  . rosuvastatin  20 mg Oral QHS  . sertraline  50 mg Oral Daily   Continuous Infusions: . cefTRIAXone (ROCEPHIN)  IV 2 g (12/16/19 2151)  . heparin 1,850 Units/hr (12/17/19 1012)     LOS: 0 days    Time spent: 41 minutes spent on chart review, discussion with nursing staff, consultants, updating family and interview/physical exam; more than 50% of that time was spent in counseling and/or coordination of care.    Lilja Soland J [place] DO [facility] Available via Epic secure chat 7am-7pm After these hours, please refer to coverage provider listed on [email or web address] 12/17/2019, 12:59 PM

## 2019-12-17 NOTE — Consult Note (Signed)
Referral MD  Reason for Referral: Metastatic colon cancer; E. coli bacteremia; status post chemotherapy  Chief Complaint  Patient presents with  . Fever  : I started having chills and ended up in the hospital with E. coli.  HPI: Mr. Goody is well-known to me.  He is a 64 year old white male.  He has metastatic adenocarcinoma of the colorectum.  He has been on chemotherapy.  He has had 2 cycles of FOLFOX.  His last cycle of chemotherapy was on 12/09/2019.  He is done well chemotherapy so far.  He began to have chills after having dinner on Tuesday night.  He then had a temperature of about 101.  He was told to go to the emergency room.  He spiked a temperature of 103.1 in the emergency room.  Cultures were taken.  He was found to have E. coli in his blood.  He thinks he may have gotten this from eating salad.  He does have the atrial fibrillation.  This is chronic.  He had a CT angiogram of the chest done.  This was negative for pulmonary embolism.  He has had no diarrhea.  Has had some loose stools but this is chronic.  He has had no dysuria.  He has had no abdominal discomfort.  There is been no rashes.  Has had no leg swelling.  He does have the weakness on the right side from past CVA.  This is improving.  His white cell count has been on the low side.  When he came in, his white cell count was 2.5.  Hemoglobin 10.8.  Platelet count 214,000.  His BUN was 22 creatinine 1.25.  He is on IV antibiotics.  He is feeling better.  Overall, his performance status is ECOG 1.     Past Medical History:  Diagnosis Date  . Allergic rhinitis    [facility]  . Anxiety   . Atrial fibrillation ([place])    [facility]  . Benign essential hypertension    [facility]  . Coronary artery disease    [facility]  . Diabetic neuropathy, type II diabetes mellitus ([place])    [facility]  .  Dysrhythmia   . Iron deficiency anemia due to chronic blood loss 11/18/2019  . Mixed hyperlipidemia    [facility]  . Myocardial infarct [facility])     [facility] Family [facility]  . Obesity, unspecified     [facility]  . Rectal bleeding     [facility]  . 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 ([place]) 11/17/2019  . Stroke ([place])   . Wide-complex tachycardia ([place]) 06/04/2019  :  Past Surgical History:  Procedure Laterality Date  . BIOPSY  10/07/2019   Procedure: BIOPSY;  Surgeon: Lavena Bullion, DO;  Location: [facility];  Service: Gastroenterology;;  . COLONOSCOPY WITH PROPOFOL N/A 10/07/2019   Procedure: COLONOSCOPY WITH PROPOFOL;  Surgeon: Lavena Bullion, DO;  Location: [facility];  Service: Gastroenterology;  Laterality: N/A;  . CORONARY ANGIOPLASTY     3 stents  . IR IMAGING GUIDED PORT INSERTION  11/24/2019  . LEFT HEART CATH AND CORONARY ANGIOGRAPHY N/A 06/05/2019   Procedure: LEFT HEART CATH AND CORONARY ANGIOGRAPHY;  Surgeon: Nelva Bush, MD;  Location: [place] [facility];  Service: Cardiovascular;  Laterality: N/A;  . POLYPECTOMY  10/07/2019   Procedure: POLYPECTOMY;  Surgeon: Lavena Bullion, DO;  Location: [facility];  Service:  Gastroenterology;;  . Lia Foyer TATTOO INJECTION  10/07/2019   Procedure: SUBMUCOSAL TATTOO INJECTION;  Surgeon: Lavena Bullion, DO;  Location: [facility];  Service: Gastroenterology;;  :   Current Facility-Administered Medications:  .  acetaminophen (TYLENOL) tablet 500 mg, 500 mg, Oral, Q6H PRN, Chotiner, Yevonne Aline, MD, 500 mg at 12/17/19 0133 .  albuterol (PROVENTIL) (2.5 MG/3ML) 0.083% nebulizer solution 2.5 mg, 2.5 mg, Nebulization, Q2H PRN, Chotiner, Yevonne Aline, MD .  cefTRIAXone (ROCEPHIN) 2 g in sodium chloride 0.9 % 100 mL IVPB, 2 g, Intravenous, Q24H, [place] Donnamarie Poag, DO, Last Rate: 200 mL/hr at 12/16/19 2151, 2 g at 12/16/19 2151 .  Chlorhexidine  Gluconate Cloth 2 % PADS 6 each, 6 each, Topical, Daily, [place] Eric J, DO .  clopidogrel (PLAVIX) tablet 75 mg, 75 mg, Oral, Daily, Chotiner, Yevonne Aline, MD, 75 mg at 12/16/19 1141 .  diltiazem (CARDIZEM CD) 24 hr capsule 180 mg, 180 mg, Oral, Daily, Chotiner, Yevonne Aline, MD, 180 mg at 12/16/19 1206 .  ezetimibe (ZETIA) tablet 10 mg, 10 mg, Oral, QHS, Chotiner, Yevonne Aline, MD, 10 mg at 12/16/19 2142 .  heparin ADULT infusion 100 units/mL (25000 units/260mL sodium chloride 0.45%), 1,650 Units/hr, Intravenous, Continuous, Kris Mouton, [facility], Last Rate: 16.5 mL/hr at 12/16/19 2318, 1,650 Units/hr at 12/16/19 2318 .  insulin aspart (novoLOG) injection 0-5 Units, 0-5 Units, Subcutaneous, QHS, [place] Eric J, DO .  insulin aspart (novoLOG) injection 0-9 Units, 0-9 Units, Subcutaneous, TID WC, [place] Eric J, DO .  metoprolol succinate (TOPROL-XL) 24 hr tablet 100 mg, 100 mg, Oral, Daily, Chotiner, Yevonne Aline, MD, 100 mg at 12/16/19 1141 .  ondansetron (ZOFRAN) tablet 4 mg, 4 mg, Oral, Q6H PRN **OR** ondansetron (ZOFRAN) injection 4 mg, 4 mg, Intravenous, Q6H PRN, Chotiner, Yevonne Aline, MD .  rosuvastatin (CRESTOR) tablet 20 mg, 20 mg, Oral, QHS, Chotiner, Yevonne Aline, MD, 20 mg at 12/16/19 2142 .  senna-docusate (Senokot-S) tablet 1 tablet, 1 tablet, Oral, QHS PRN, Chotiner, Yevonne Aline, MD .  sertraline (ZOLOFT) tablet 50 mg, 50 mg, Oral, Daily, Chotiner, Yevonne Aline, MD, 50 mg at 12/16/19 1140 .  Tbo-Filgrastim (GRANIX) injection 480 mcg, 480 mcg, Subcutaneous, Once, Ennever, Rudell Cobb, MD .  traZODone (DESYREL) tablet 50 mg, 50 mg, Oral, QHS PRN, Chotiner, Yevonne Aline, MD:  . Chlorhexidine Gluconate Cloth  6 each Topical Daily  . clopidogrel  75 mg Oral Daily  . diltiazem  180 mg Oral Daily  . ezetimibe  10 mg Oral QHS  . insulin aspart  0-5 Units Subcutaneous QHS  . insulin aspart  0-9 Units Subcutaneous TID WC  . metoprolol succinate  100 mg Oral Daily  . rosuvastatin  20 mg Oral QHS  . sertraline  50  mg Oral Daily  . Tbo-Filgrastim  480 mcg Subcutaneous Once  :  Allergies  Allergen Reactions  . Ibuprofen Other (See Comments)    Was told to not take this   . Metformin And Related Other (See Comments)    Bloody stools   . Naproxen Other (See Comments)    Was told to not take this   . Yellow Jacket Venom [Bee Venom] Swelling and Other (See Comments)    Severe swelling where stung  . Penicillins Hives    Did it involve swelling of the face/tongue/throat, SOB, or low BP? Unk Did it involve sudden or severe rash/hives, skin peeling, or any reaction on the inside of your mouth or nose? Yes Did you need to seek medical attention  at a hospital or doctor's office? Unk When did it last happen?"Childhood- 55 years ago" If all above answers are "NO", may proceed with cephalosporin use.   . Celexa [Citalopram]     Contraindicated with A-Fib  :  Family History  Problem Relation Age of Onset  . Diverticulitis Mother   . Hypertension Father   . Heart disease Father        MI  . Prostate cancer Father   . Colon cancer Neg Hx   . Stomach cancer Neg Hx   . Pancreatic cancer Neg Hx   . Rectal cancer Neg Hx   :  Social History   Socioeconomic History  . Marital status: Divorced    Spouse name: Not on file  . Number of children: 2  . Years of education: Not on file  . Highest education level: Not on file  Occupational History  . Occupation: retired  Tobacco Use  . Smoking status: Never Smoker  . Smokeless tobacco: Former Systems developer    Types: Secondary school teacher  . Vaping Use: Never used  Substance and Sexual Activity  . Alcohol use: Not Currently    Comment: occasional  . Drug use: Not Currently    Types: Marijuana  . Sexual activity: Not Currently  Other Topics Concern  . 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 Difficulty of Paying Living Expenses: Not on file  Food Insecurity:   . Worried About Charity fundraiser in the Last Year: Not on file  . Ran Out of Food in the Last Year: Not on file  Transportation Needs:   . Lack of Transportation (Medical): Not on file  . Lack of Transportation (Non-Medical): Not on file  Physical Activity:   . Days of Exercise per Week: Not on file  . Minutes of Exercise per Session: Not on file  Stress:   . Feeling of Stress : Not on file  Social Connections:   . Frequency of Communication with Friends and Family: Not on file  . Frequency of Social Gatherings with Friends and Family: Not on file  . Attends Religious Services: Not on file  . Active Member of Clubs or Organizations: Not on file  . Attends Archivist Meetings: Not on file  . Marital Status: Not on file  Intimate Partner Violence:   . Fear of Current or Ex-Partner: Not on file  . Emotionally Abused: Not on file  . Physically Abused: Not on file  . Sexually Abused: Not on file  :  Review of Systems  Constitutional: Positive for chills, diaphoresis and fever.  HENT: Negative.   Eyes: Negative.   Respiratory: Negative.   Cardiovascular: Positive for palpitations.  Gastrointestinal: Positive for abdominal pain and nausea.  Genitourinary: Negative.   Musculoskeletal: Negative.   Skin: Negative.   Neurological: Positive for focal weakness.  Endo/Heme/Allergies: Negative.   Psychiatric/Behavioral: Negative.      Exam:  This is a well-developed and well-nourished white male in no obvious distress.  Vital signs are temperature of 98.4.  Pulse 94.  Blood pressure 134/86.  Head and neck exam shows no scleral icterus.  He has no adenopathy in the neck.  There is no oral lesions.  He has no mucositis.  Lungs are clear bilaterally.  Cardiac exam irregular rate and irregular rhythm consistent with atrial fibrillation.  He has no  murmurs, rubs or bruits.  Abdomen is soft.  He has decent bowel sounds.  There is no guarding or rebound tenderness.  There is no obvious fluid wave.  There is no palpable liver or spleen tip.  Extremities shows no clubbing, cyanosis or edema.  Skin exam shows no rashes, ecchymoses or petechia.  Neurological exam is nonfocal.    Patient Vitals for the past 24 hrs:  BP Temp Temp src Pulse Resp SpO2  12/17/19 0545 134/86 98.4 F (36.9 C) Oral 94 18 95 %  12/17/19 0344 - 98.9 F (37.2 C) Oral 92 - -  12/17/19 0133 139/86 (!) 102.8 F (39.3 C) Oral (!) 111 18 -  12/16/19 1902 - (!) 101.2 F (38.4 C) - - - -  12/16/19 1743 - - Oral - - -  12/16/19 1739 128/79 (!) 103 F (39.4 C) Oral - 20 98 %  12/16/19 1706 - (!) 100.5 F (38.1 C) - - - -  12/16/19 0800 105/68 (!) 97.3 F (36.3 C) Axillary - (!) 25 -  12/16/19 0700 96/67 - - (!) 103 18 98 %     Recent Labs    12/16/19 0110  WBC 2.5*  HGB 10.8*  HCT 32.6*  PLT 214   Recent Labs    12/16/19 0110 12/16/19 0756  NA 138  --   K 2.7* 4.2  CL 106  --   CO2 24  --   GLUCOSE 141*  --   BUN 22  --   CREATININE 1.25*  --   CALCIUM 8.7*  --     Blood smear review: None  Pathology: None    Assessment and Plan: Mr. Dirocco is a 64 year old white male.  He has metastatic adenocarcinoma the colorectum.  He has had 2 cycles of chemotherapy with FOLFOX.  Thankfully, he is really not neutropenic.  However, we will have to see what his white cell count is.  I think he probably does need a dose of Neupogen to make sure that his white cell count does not go low.  I probably would get a CT of the abdomen pelvis make sure there is no diverticulosis/diverticulitis or abscess.  I am unsure if he needs an echocardiogram.  He does have the Port-A-Cath in place.  I know the E. coli can be somewhat fastidious with Port-A-Cath tips.  I think the continues to have any temperature spikes, the Port-A-Cath may actually need to come out for fear of  the Port-A-Cath being infected.  We will see what the E. coli is sensitive to.  I would have to think that is going to be relatively sensitive to most antibiotics.  He does look quite good.  Hopefully, he will be able to recover quickly and be able to go home.  I appreciate all the great care that he is getting from staff on [facility].  I know that they are very compassionate and a very professional with their care of our patients.  Lattie Haw, MD  Daisy Lazar 3:17

## 2019-12-17 NOTE — Progress Notes (Signed)
[place] for Heparin Indication: atrial fibrillation  Allergies  Allergen Reactions  . Ibuprofen Other (See Comments)    Was told to not take this   . Metformin And Related Other (See Comments)    Bloody stools   . Naproxen Other (See Comments)    Was told to not take this   . Yellow Jacket Venom [Bee Venom] Swelling and Other (See Comments)    Severe swelling where stung  . Penicillins Hives    Did it involve swelling of the face/tongue/throat, SOB, or low BP? Unk Did it involve sudden or severe rash/hives, skin peeling, or any reaction on the inside of your mouth or nose? Yes Did you need to seek medical attention at a hospital or doctor's office? Unk When did it last happen?"Childhood- 55 years ago" If all above answers are "NO", may proceed with cephalosporin use.   . Celexa [Citalopram]     Contraindicated with A-Fib    Patient Measurements: Height: 5\' 10"  (177.8 cm) Weight: 93 kg (205 lb 0.4 oz) IBW/kg (Calculated) : 73 Heparin Dosing Weight: 90 kg  Vital Signs: Temp: 98.1 F (36.7 C) (08/19 1705) Temp Source: Oral (08/19 1705) BP: 126/71 (08/19 1705) Pulse Rate: 93 (08/19 1705)  Labs: Recent Labs    12/16/19 0110 12/16/19 1811 12/17/19 0818 12/17/19 1811  HGB 10.8*  --  9.1*  --   HCT 32.6*  --  27.1*  --   PLT 214  --  163  --   LABPROT 15.8*  --   --   --   INR 1.3*  --   --   --   HEPARINUNFRC  --  <0.10* 0.14* 0.16*  CREATININE 1.25*  --  1.21  --     Estimated Creatinine Clearance: 69.7 mL/min (by C-G formula based on SCr of 1.21 mg/dL).   Medical History: Past Medical History:  Diagnosis Date  . Allergic rhinitis    [facility]  . Anxiety   . Atrial fibrillation ([place])    [facility]  . Benign essential hypertension    [facility]  . Coronary artery disease    [facility]  . Diabetic neuropathy, type II diabetes  mellitus ([place])    [facility]  . Dysrhythmia   . Iron deficiency anemia due to chronic blood loss 11/18/2019  . Mixed hyperlipidemia    [facility]  . Myocardial infarct [facility])    [facility]  . Obesity, unspecified    [facility]  . Rectal bleeding    [facility]  . 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 Stroke ([place])   . Wide-complex tachycardia ([place]) 06/04/2019    Assessment: 64 yr old male with atrial fibrillation, on heparin (not on anticoagulation PTA).  Heparin level ~8 hrs after heparin infusion was increased to 1850 units/hr was 0.16 units/ml, which is below the goal range for this pt. H/H 9.1/27.1, platelets 163. Per RN, no issues with IV or bleeding observed.  Goal of Therapy:  Heparin level 0.3-0.7 units/ml Monitor platelets by anticoagulation protocol: Yes   Plan:  Increase heparin infusion to 2100 units/hr Check 6-hr heparin level Monitor daily heparin level, CBC Monitor for signs/symptoms of bleeding  Gillermina Hu, PharmD, BCPS, [facility] Clinical Pharmacist 12/17/2019 6:43 PM

## 2019-12-17 NOTE — Progress Notes (Signed)
[place] for Heparin Indication: atrial fibrillation  Allergies  Allergen Reactions  . Ibuprofen Other (See Comments)    Was told to not take this   . Metformin And Related Other (See Comments)    Bloody stools   . Naproxen Other (See Comments)    Was told to not take this   . Yellow Jacket Venom [Bee Venom] Swelling and Other (See Comments)    Severe swelling where stung  . Penicillins Hives    Did it involve swelling of the face/tongue/throat, SOB, or low BP? Unk Did it involve sudden or severe rash/hives, skin peeling, or any reaction on the inside of your mouth or nose? Yes Did you need to seek medical attention at a hospital or doctor's office? Unk When did it last happen?"Childhood- 53 years ago" If all above answers are "NO", may proceed with cephalosporin use.   . Celexa [Citalopram]     Contraindicated with A-Fib    Patient Measurements: Height: 5\' 10"  (177.8 cm) Weight: 93 kg (205 lb 0.4 oz) IBW/kg (Calculated) : 73 Heparin Dosing Weight: 90 kg  Vital Signs: Temp: 98.4 F (36.9 C) (08/19 0545) Temp Source: Oral (08/19 0545) BP: 134/86 (08/19 0545) Pulse Rate: 94 (08/19 0545)  Labs: Recent Labs    12/16/19 0110 12/16/19 1811 12/17/19 0818  HGB 10.8*  --  9.1*  HCT 32.6*  --  27.1*  PLT 214  --  163  LABPROT 15.8*  --   --   INR 1.3*  --   --   HEPARINUNFRC  --  <0.10* 0.14*  CREATININE 1.25*  --  1.21    Estimated Creatinine Clearance: 69.7 mL/min (by C-G formula based on SCr of 1.21 mg/dL).   Medical History: Past Medical History:  Diagnosis Date  . Allergic rhinitis    [facility]  . Anxiety   . Atrial fibrillation ([place])    [facility]  . Benign essential hypertension    [facility]  . Coronary artery disease    [facility]  . Diabetic neuropathy, type II diabetes mellitus ([place])    [facility]  .  Dysrhythmia   . Iron deficiency anemia due to chronic blood loss 11/18/2019  . Mixed hyperlipidemia    [facility]  . Myocardial infarct [facility])    [facility]  . Obesity, unspecified    [facility]  . Rectal bleeding     [facility]  . 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 Stroke ([place])   . Wide-complex tachycardia ([place]) 06/04/2019    Medications:  No current facility-administered medications on file prior to encounter.   Current Outpatient Medications on File Prior to Encounter  Medication Sig Dispense Refill  . acetaminophen (TYLENOL) 500 MG tablet Take 500 mg by mouth every 6 (six) hours as needed for fever.    . clonazePAM (KLONOPIN) 1 MG disintegrating tablet Take 1 tablet (1 mg total) by mouth 2 (two) times daily as needed. (Patient taking differently: Take 1 mg by mouth 2 (two) times daily as needed (for anxiety). ) 30 tablet 0  . clopidogrel (PLAVIX) 75 MG tablet Take 1 tablet (75 mg total) by mouth daily. 30 tablet 0  . diltiazem (CARDIZEM CD) 180 MG 24 hr capsule TAKE 1 CAPSULE BY MOUTH EVERY DAY (Patient taking differently: Take 180 mg by mouth daily. ) 30 capsule  1  . ezetimibe (ZETIA) 10 MG tablet Take 10 mg by mouth at bedtime.    . fluticasone (FLONASE) 50 MCG/ACT nasal spray Place 1-2 sprays into both nostrils as needed (for seasonal allergies).     . gabapentin (NEURONTIN) 100 MG capsule TAKE 1 CAPSULE BY MOUTH AT BEDTIME. (Patient taking differently: Take 100 mg by mouth at bedtime. ) 30 capsule 3  . glimepiride (AMARYL) 2 MG tablet Take 1 tablet (2 mg total) by mouth daily with breakfast. 30 tablet 0  . metoprolol succinate (TOPROL XL) 100 MG 24 hr tablet Take 1 tablet (100 mg total) by mouth in the morning and at bedtime. 60 tablet 3  . nitroGLYCERIN (NITROSTAT) 0.4 MG SL tablet Place 0.4 mg under the tongue every 5 (five) minutes as needed for chest pain.      Marland Kitchen ondansetron (ZOFRAN) 8 MG tablet Take 1 tablet (8 mg total) by mouth 2 (two) times daily as needed for refractory nausea / vomiting. Start on day 3 after chemotherapy. 30 tablet 1  . prochlorperazine (COMPAZINE) 10 MG tablet Take 1 tablet (10 mg total) by mouth every 6 (six) hours as needed (Nausea or vomiting). (Patient taking differently: Take 10 mg by mouth every 6 (six) hours as needed for nausea or vomiting. ) 30 tablet 1  . rosuvastatin (CRESTOR) 20 MG tablet Take 1 tablet (20 mg total) by mouth daily. (Patient taking differently: Take 20 mg by mouth at bedtime. ) 30 tablet 0  . sertraline (ZOLOFT) 50 MG tablet Take 50 mg by mouth daily.    . traZODone (DESYREL) 50 MG tablet TAKE 1 TABLET (50 MG TOTAL) BY MOUTH AT BEDTIME AS NEEDED FOR SLEEP. 30 tablet 3  . acetaminophen (TYLENOL) 325 MG tablet Take 1-2 tablets (325-650 mg total) by mouth every 4 (four) hours as needed for mild pain. (Patient not taking: Reported on 12/16/2019)    . glucose blood test strip Monitor BS before meals and at bedtime for a couple of weeks. Then may decrease to bid before meals 100 each 12  . Lancets (ONETOUCH ULTRASOFT) lancets Use as instructed 100 each 12  . lidocaine-prilocaine (EMLA) cream Apply to affected area once (Patient not taking: Reported on 12/16/2019) 30 g 3  . LORazepam (ATIVAN) 0.5 MG tablet Take 1 tablet (0.5 mg total) by mouth every 6 (six) hours as needed (Nausea or vomiting). (Patient not taking: Reported on 12/16/2019) 30 tablet 0     Assessment: 64 y.o. male with Afib on heparin (not on anticoagulation PTA)  Heparin level came back subtherapeutic this AM. We will increase the dose again and recheck another level.   Hgb 9.1, plt wnl  Goal of Therapy:  Heparin level 0.3-0.7 units/ml Monitor platelets by anticoagulation protocol: Yes   Plan:  Increase heparin to 1850 units/hr Heparin level in 6 hours and daily wth CBC daily  Onnie Boer, PharmD, [place], AAHIVP, CPP Infectious Disease  Pharmacist 12/17/2019 9:50 AM

## 2019-12-18 DIAGNOSIS — K5792 Diverticulitis of intestine, part unspecified, without perforation or abscess without bleeding: Secondary | ICD-10-CM | POA: Diagnosis present

## 2019-12-18 LAB — GLUCOSE, CAPILLARY
Glucose-Capillary: 135 mg/dL — ABNORMAL HIGH (ref 70–99)
Glucose-Capillary: 136 mg/dL — ABNORMAL HIGH (ref 70–99)
Glucose-Capillary: 126 mg/dL — ABNORMAL HIGH (ref 70–99)
Glucose-Capillary: 163 mg/dL — ABNORMAL HIGH (ref 70–99)

## 2019-12-18 LAB — CBC
HCT: 25.8 % — ABNORMAL LOW (ref 39.0–52.0)
Hemoglobin: 9 g/dL — ABNORMAL LOW (ref 13.0–17.0)
MCH: 30.6 pg (ref 26.0–34.0)
MCHC: 34.9 g/dL (ref 30.0–36.0)
MCV: 87.8 fL (ref 80.0–100.0)
Platelets: 143 10*3/uL — ABNORMAL LOW (ref 150–400)
RBC: 2.94 MIL/uL — ABNORMAL LOW (ref 4.22–5.81)
RDW: 19.7 % — ABNORMAL HIGH (ref 11.5–15.5)
WBC: 17.4 10*3/uL — ABNORMAL HIGH (ref 4.0–10.5)
nRBC: 0 % (ref 0.0–0.2)

## 2019-12-18 LAB — BASIC METABOLIC PANEL
Anion gap: 8 (ref 5–15)
BUN: 14 mg/dL (ref 8–23)
CO2: 24 mmol/L (ref 22–32)
Calcium: 8.2 mg/dL — ABNORMAL LOW (ref 8.9–10.3)
Chloride: 102 mmol/L (ref 98–111)
Creatinine, Ser: 1.06 mg/dL (ref 0.61–1.24)
GFR calc Af Amer: >60 mL/min (ref >60)
GFR calc non Af Amer: >60 mL/min (ref >60)
Glucose, Bld: 146 mg/dL — ABNORMAL HIGH (ref 70–99)
Potassium: 3.4 mmol/L — ABNORMAL LOW (ref 3.5–5.1)
Sodium: 134 mmol/L — ABNORMAL LOW (ref 135–145)

## 2019-12-18 LAB — CULTURE, BLOOD (ROUTINE X 2): Special Requests: ADEQUATE

## 2019-12-18 LAB — HEPARIN LEVEL (UNFRACTIONATED): Heparin Unfractionated: 0.23 IU/mL — ABNORMAL LOW (ref 0.30–0.70)

## 2019-12-18 MED ORDER — CEPHALEXIN 500 MG PO CAPS
1000.0000 mg | ORAL_CAPSULE | Freq: Three times a day (TID) | ORAL | Status: DC
  Administered 2019-12-18 – 2019-12-19 (×2): 1000 mg via ORAL
  Filled 2019-12-18 (×3): qty 2

## 2019-12-18 MED ORDER — POTASSIUM CHLORIDE CRYS ER 20 MEQ PO TBCR
30.0000 meq | EXTENDED_RELEASE_TABLET | ORAL | Status: AC
  Administered 2019-12-18 (×2): 30 meq via ORAL
  Filled 2019-12-18 (×2): qty 1

## 2019-12-18 MED ORDER — SODIUM CHLORIDE 0.9 % IV SOLN
510.0000 mg | Freq: Once | INTRAVENOUS | Status: AC
  Administered 2019-12-18: 510 mg via INTRAVENOUS
  Filled 2019-12-18: qty 17

## 2019-12-18 MED ORDER — ALUM & MAG HYDROXIDE-SIMETH 200-200-20 MG/5ML PO SUSP
30.0000 mL | Freq: Four times a day (QID) | ORAL | Status: DC | PRN
  Administered 2019-12-18: 30 mL via ORAL
  Filled 2019-12-18: qty 30

## 2019-12-18 NOTE — Progress Notes (Addendum)
Ok to dc metronidazole and keep on ceftriaxone since pt just have e.coli bacteremia. Ok to dc IV heparin. Per Dr. [place]  Onnie Boer, PharmD, BCIDP, AAHIVP, CPP Infectious Disease Pharmacist 12/18/2019 7:42 AM   Addendum:  Sensitivities for e.coli is back. It's pan sensitive except for cipro. Ok to optimize ceftriaxone to cephalexin to complete 10d of therapy due to his immunocompromised status. We will use a higher dose of keflex due to the indication (bacteremia) and wt (93kg). Renal function is ok. Hx of PCN allergy by has been tolerating ceftriaxone without any issue. Ok per Dr. [place]  Keflex 1000mg  PO TID x 9 more days  Onnie Boer, PharmD, [place], AAHIVP, CPP Infectious Disease Pharmacist 12/18/2019 11:00 AM

## 2019-12-18 NOTE — Progress Notes (Signed)
PROGRESS NOTE    Gabreal Worton  ZDG:[id number] DOB: 02/27/1956 DOA: 12/16/2019 PCP: Leonides Sake, MD    Brief Narrative:  Maxey Ransom is a 64 y.o. male with medical history significant for rectal cancer recently started on chemotherapy, hypertension, CAD, previous MI,  diabetes mellitus, previous stroke who presents for evaluation of fever and chills.  He reports he went to dinner with some friends as and on the way home he developed chills and when he got home his temperature was 101 degrees.  His daughter was with him and became concerned and called her oncologist to advised him to come to the emergency room for evaluation. When he arrived in the emergency room temperature was 103 degrees. He continued to have chills but did not have any cough, chest pain, palpitations, nausea, vomiting, diarrhea, abdominal pain, urinary frequency or dysuria.  He has not had any rash.  He denies any known sick contacts. He did have his COVID-19 vaccine in June.  Reports he has never had DVT or PE and has not had any prolonged immobilization or recent travel.   In the ED, temperature 103.  Given Tylenol and fever has broken.  Noted to have decreased white blood cell count with normal neutrophil value.  Chest x-ray did not reveal any pneumonia.  Urinalysis is negative for infection.  No source of fever identified.  ER provider discussed with oncology who recommended patient stay for empiric antibiotics while cultures are pending.   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 coli septicemia Acute sigmoid diverticulitis Patient presenting to the ED with chills and fever.  Reports was having dinner when driving home with symptom  onset.  Temperature 103.1 on presentation.  Lactic acid mildly elevated at 1.5.  Patient with leukopenia without neutropenia, likely secondary to active chemotherapy.  Covid-19 PCR negative.  Respiratory viral panel negative.  Chest x-ray with no acute cardiopulmonary disease process.  Urinalysis unrevealing and urine culture with no growth.  CT abdomen/pelvis notable for moderate sigmoid diverticulitis without perforation or abscess. --Blood cultures x 2 positive E. coli, only resistant to Cipro --Transition ceftriaxone to Keflex 1000 mg p.o. TID to complete 10d course --Continue to monitor fever curve --Antipyretics as needed  Hypokalemia Potassium 3.4 today, will replete --Monitor electrolytes closely daily  Leukopenia secondary to antineoplastic chemotherapy WBC count of 2.5; neutrophils 87; no neutropenia; on admission --Received Granix on 12/17/2019; WBC up to 17.4 --Continue monitor CBC daily  Mucosal changes distal esophagus Incidental finding of mildly thickened distal esophagus on CT angiogram chest.  Recommend outpatient follow-up/referral to gastroenterology for consideration of EGD for further evaluation.  Paroxysmal atrial fibrillation Essential hypertension --Continue diltiazem 180 mg p.o. daily and metoprolol succinate 100 mg p.o. daily --Currently not on anticoagulation per oncology  Type 2 diabetes mellitus Hemoglobin A1c 8.0 on 10/09/2019.  On Amaryl 2 mg p.o. daily outpatient. --Continue to hold oral hypoglycemics while inpatient --Insulin sliding scale for coverage --CBGs before every meal/at bedtime  CAD --Continue Plavix 75 mg p.o. daily --Continue statin  HLD: Continue Crestor 20 mg p.o. daily and Zetia 10 mg p.o. 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 Marin Olp.  Has  completed 2 courses of chemotherapy, last chemotherapy 1 week prior. --Dr. Marin Olp following, appreciate his  assistance --Continue outpatient follow-up with oncology   DVT prophylaxis: SCDs, ambulation Code Status: Full code Family Communication: No family present at bedside this morning  Disposition Plan:  Status is: Inpatient  Remains inpatient appropriate because:Ongoing diagnostic testing needed not appropriate for outpatient work up, Unsafe d/c plan, IV treatments appropriate due to intensity of illness or inability to take PO and Inpatient level of care appropriate due to severity of illness continues to be febrile on IV antibiotics awaiting culture sensitivities   Dispo: The patient is from: Home              Anticipated d/c is to: Home once fever free x24 hours              Anticipated d/c date is: 1 day              Patient currently is not medically stable to d/c.   Consultants:   Medical oncology - Dr. Marin Olp  Procedures:   None  Antimicrobials:   Vancomycin 8/18 - 8/18  Meropenem 8/18 - 8/18  Ceftriaxone 8/18>>  Metronidazole 8/19>>   Subjective: Patient seen and examined bedside, resting comfortably.  T-max past 24 hours 100.0.  Transitioning from IV to oral antibiotics based on culture susceptibilities.  Patient with no other complaints or concerns at this time. Denies headache, no current fever, no current chills/night sweats, no nausea/vomiting/diarrhea, no chest pain, palpitations, no shortness of breath, no abdominal pain, no weakness, no fatigue, no paresthesias.  No acute events overnight per nursing staff.  Objective: Vitals:   12/17/19 1705 12/18/19 0039 12/18/19 0557 12/18/19 1300  BP: 126/71 136/86 111/84 119/69  Pulse: 93 98 98 86  Resp: 18 15 17 18   Temp: 98.1 F (36.7 C) 98.7 F (37.1 C) 98.4 F (36.9 C) 99 F (37.2 C)  TempSrc: Oral Oral Oral Oral  SpO2: 95% 98% 97% 98%  Weight:      Height:        Intake/Output Summary (Last 24 hours) at 12/18/2019 1432 Last data filed at 12/18/2019 0800 Gross per 24 hour  Intake 1362.66 ml  Output -   Net 1362.66 ml   Filed Weights   12/16/19 0055  Weight: 93 kg    Examination:  General exam: Appears calm and comfortable  Respiratory system: Clear to auscultation. Respiratory effort normal. Cardiovascular system: S1 & S2 heard, RRR. No JVD, murmurs, rubs, gallops or clicks. No pedal edema. Gastrointestinal system: Abdomen is nondistended, soft and nontender. No organomegaly or masses felt. Normal bowel sounds heard. Central nervous system: Alert and oriented. No focal neurological deficits. Extremities: Symmetric 5 x 5 power. Skin: No rashes, lesions or ulcers Psychiatry: Judgement and insight appear normal. Mood & affect appropriate.     Data Reviewed: I have personally reviewed following labs and imaging studies  CBC: Recent Labs  Lab 12/16/19 0110 12/17/19 0818 12/18/19 0238  WBC 2.5* 8.3 17.4*  NEUTROABS 2.2  --   --   HGB 10.8* 9.1* 9.0*  HCT 32.6* 27.1* 25.8*  MCV 88.6 89.4 87.8  PLT 214 163 355*   Basic Metabolic Panel: Recent Labs  Lab 12/16/19 0110 12/16/19 0756 12/17/19 0818 12/18/19 0238  NA 138  --  136 134*  K 2.7* 4.2 3.7 3.4*  CL 106  --  103 102  CO2 24  --  27 24  GLUCOSE 141*  --  144* 146*  BUN  22  --  17 14  CREATININE 1.25*  --  1.21 1.06  CALCIUM 8.7*  --  8.3* 8.2*  MG 1.8 1.9 2.0  --    GFR: Estimated Creatinine Clearance: 79.6 mL/min (by C-G formula based on SCr of 1.06 mg/dL). Liver Function Tests: Recent Labs  Lab 12/16/19 0110  AST 18  ALT 18  ALKPHOS 45  BILITOT 1.3*  PROT 6.0*  ALBUMIN 3.6   No results for input(s): LIPASE, AMYLASE in the last 168 hours. No results for input(s): AMMONIA in the last 168 hours. Coagulation Profile: Recent Labs  Lab 12/16/19 0110  INR 1.3*   Cardiac Enzymes: No results for input(s): CKTOTAL, CKMB, CKMBINDEX, TROPONINI in the last 168 hours. BNP (last 3 results) No results for input(s): PROBNP in the last 8760 hours. HbA1C: No results for input(s): HGBA1C in the last 72  hours. CBG: Recent Labs  Lab 12/17/19 1109 12/17/19 1702 12/17/19 2106 12/18/19 0556 12/18/19 1121  GLUCAP 109* 184* 138* 135* 136*   Lipid Profile: No results for input(s): CHOL, HDL, LDLCALC, TRIG, CHOLHDL, LDLDIRECT in the last 72 hours. Thyroid Function Tests: No results for input(s): TSH, T4TOTAL, FREET4, T3FREE, THYROIDAB in the last 72 hours. Anemia Panel: No results for input(s): VITAMINB12, FOLATE, FERRITIN, TIBC, IRON, RETICCTPCT in the last 72 hours. Sepsis Labs: Recent Labs  Lab 12/16/19 0110  LATICACIDVEN 1.5    Recent Results (from the past 240 hour(s))  Culture, blood (Routine x 2)     Status: Abnormal   Collection Time: 12/16/19 12:45 AM   Specimen: BLOOD LEFT ARM  Result Value Ref Range Status   Specimen Description BLOOD LEFT ARM  Final   Special Requests   Final    BOTTLES DRAWN AEROBIC AND ANAEROBIC Blood Culture adequate volume   Culture  Setup Time   Final    GRAM NEGATIVE RODS IN BOTH AEROBIC AND ANAEROBIC BOTTLES Organism ID to follow Performed at [facility], [place] [address], [place], [place] [address]    Culture ESCHERICHIA COLI (A)  Final   Report Status 12/18/2019 FINAL  Final   Organism ID, Bacteria ESCHERICHIA COLI  Final      Susceptibility   Escherichia coli - MIC*    AMPICILLIN <=2 SENSITIVE Sensitive     CEFAZOLIN <=4 SENSITIVE Sensitive     CEFEPIME <=0.12 SENSITIVE Sensitive     CEFTAZIDIME <=1 SENSITIVE Sensitive     CEFTRIAXONE <=0.25 SENSITIVE Sensitive     CIPROFLOXACIN >=4 RESISTANT Resistant     GENTAMICIN <=1 SENSITIVE Sensitive     IMIPENEM <=0.25 SENSITIVE Sensitive     TRIMETH/SULFA <=20 SENSITIVE Sensitive     AMPICILLIN/SULBACTAM <=2 SENSITIVE Sensitive     PIP/TAZO <=4 SENSITIVE Sensitive     * ESCHERICHIA COLI  Blood Culture ID Panel (Reflexed)     Status: Abnormal   Collection Time: 12/16/19 12:45 AM  Result Value Ref Range Status   Enterococcus faecalis NOT DETECTED NOT DETECTED Final   Enterococcus  Faecium NOT DETECTED NOT DETECTED Final   Listeria monocytogenes NOT DETECTED NOT DETECTED Final   Staphylococcus species NOT DETECTED NOT DETECTED Final   Staphylococcus aureus (BCID) NOT DETECTED NOT DETECTED Final   Staphylococcus epidermidis NOT DETECTED NOT DETECTED Final   Staphylococcus lugdunensis NOT DETECTED NOT DETECTED Final   Streptococcus species NOT DETECTED NOT DETECTED Final   Streptococcus agalactiae NOT DETECTED NOT DETECTED Final   Streptococcus pneumoniae NOT DETECTED NOT DETECTED Final   Streptococcus pyogenes NOT DETECTED NOT  DETECTED Final   A.calcoaceticus-baumannii NOT DETECTED NOT DETECTED Final   Bacteroides fragilis NOT DETECTED NOT DETECTED Final   Enterobacterales DETECTED (A) NOT DETECTED Final    Comment: Enterobacterales represent a large order of gram negative bacteria, not a single organism. CRITICAL RESULT CALLED TO, READ BACK BY AND VERIFIED WITH: PHARMD AMANADA W. [phone] [id number] FCP    Enterobacter cloacae complex NOT DETECTED NOT DETECTED Final   Escherichia coli DETECTED (A) NOT DETECTED Final    Comment: CRITICAL RESULT CALLED TO, READ BACK BY AND VERIFIED WITH: PHARMD AMANADA W. [phone] [id number] FCP    Klebsiella aerogenes NOT DETECTED NOT DETECTED Final   Klebsiella oxytoca NOT DETECTED NOT DETECTED Final   Klebsiella pneumoniae NOT DETECTED NOT DETECTED Final   Proteus species NOT DETECTED NOT DETECTED Final   Salmonella species NOT DETECTED NOT DETECTED Final   Serratia marcescens NOT DETECTED NOT DETECTED Final   Haemophilus influenzae NOT DETECTED NOT DETECTED Final   Neisseria meningitidis NOT DETECTED NOT DETECTED Final   Pseudomonas aeruginosa NOT DETECTED NOT DETECTED Final   Stenotrophomonas maltophilia NOT DETECTED NOT DETECTED Final   Candida albicans NOT DETECTED NOT DETECTED Final   Candida auris NOT DETECTED NOT DETECTED Final   Candida glabrata NOT DETECTED NOT DETECTED Final   Candida krusei NOT DETECTED NOT DETECTED Final    Candida parapsilosis NOT DETECTED NOT DETECTED Final   Candida tropicalis NOT DETECTED NOT DETECTED Final   Cryptococcus neoformans/gattii NOT DETECTED NOT DETECTED Final   CTX-M ESBL NOT DETECTED NOT DETECTED Final   Carbapenem resistance IMP NOT DETECTED NOT DETECTED Final   Carbapenem resistance KPC NOT DETECTED NOT DETECTED Final   Carbapenem resistance NDM NOT DETECTED NOT DETECTED Final   Carbapenem resist OXA 48 LIKE NOT DETECTED NOT DETECTED Final   Carbapenem resistance VIM NOT DETECTED NOT DETECTED Final    Comment: Performed at [facility], [address], [place], [place] [address]  Culture, blood (Routine x 2)     Status: Abnormal   Collection Time: 12/16/19  1:08 AM   Specimen: BLOOD LEFT HAND  Result Value Ref Range Status   Specimen Description BLOOD LEFT HAND  Final   Special Requests   Final    BOTTLES DRAWN AEROBIC ONLY Blood Culture results may not be optimal due to an excessive volume of blood received in culture bottles   Culture  Setup Time   Final    GRAM NEGATIVE RODS AEROBIC BOTTLE ONLY CRITICAL RESULT CALLED TO, READ BACK BY AND VERIFIED WITH: PHARMD AMANADA W. [phone] [id number] FCP    Culture (A)  Final    ESCHERICHIA COLI SUSCEPTIBILITIES PERFORMED ON PREVIOUS CULTURE WITHIN THE LAST 5 DAYS. Performed at [facility], [place] [address] [place], [place] [address]    Report Status 12/18/2019 FINAL  Final  SARS Coronavirus 2 by RT PCR (hospital order, performed in [facility] hospital lab) Nasopharyngeal Nasopharyngeal Swab     Status: None   Collection Time: 12/16/19  1:25 AM   Specimen: Nasopharyngeal Swab  Result Value Ref Range Status   SARS Coronavirus 2 NEGATIVE NEGATIVE Final    Comment: (NOTE) SARS-CoV-2 target nucleic acids are NOT DETECTED.  The SARS-CoV-2 RNA is generally detectable in upper and lower respiratory specimens during the acute phase of infection. The lowest concentration of SARS-CoV-2 viral copies this assay can detect is  250 copies / mL. A negative result does not preclude SARS-CoV-2 infection and should not be used as the sole basis  for treatment or other patient management decisions.  A negative result may occur with improper specimen collection / handling, submission of specimen other than nasopharyngeal swab, presence of viral mutation(s) within the areas targeted by this assay, and inadequate number of viral copies (<250 copies / mL). A negative result must be combined with clinical observations, patient history, and epidemiological information.  Fact Sheet for Patients:   StrictlyIdeas.no  Fact Sheet for Healthcare Providers: BankingDealers.co.za  This test is not yet approved or  cleared by the [place] FDA and has been authorized for detection and/or diagnosis of SARS-CoV-2 by FDA under an Emergency Use Authorization (EUA).  This EUA will remain in effect (meaning this test can be used) for the duration of the COVID-19 declaration under Section 564(b)(1) of the Act, 21 U.S.C. section 360bbb-3(b)(1), unless the authorization is terminated or revoked sooner.  Performed at [facility], [place] [address], [place], [place] [address]   Respiratory Panel by PCR     Status: None   Collection Time: 12/16/19  4:32 AM   Specimen: Nasopharyngeal Swab; Respiratory  Result Value Ref Range Status   Adenovirus NOT DETECTED NOT DETECTED Final   Coronavirus 229E NOT DETECTED NOT DETECTED Final    Comment: (NOTE) The Coronavirus on the Respiratory Panel, DOES NOT test for the novel  Coronavirus (2019 nCoV)    Coronavirus HKU1 NOT DETECTED NOT DETECTED Final   Coronavirus NL63 NOT DETECTED NOT DETECTED Final   Coronavirus OC43 NOT DETECTED NOT DETECTED Final   Metapneumovirus NOT DETECTED NOT DETECTED Final   Rhinovirus / Enterovirus NOT DETECTED NOT DETECTED Final   Influenza A NOT DETECTED NOT DETECTED Final   Influenza B NOT DETECTED NOT  DETECTED Final   Parainfluenza Virus 1 NOT DETECTED NOT DETECTED Final   Parainfluenza Virus 2 NOT DETECTED NOT DETECTED Final   Parainfluenza Virus 3 NOT DETECTED NOT DETECTED Final   Parainfluenza Virus 4 NOT DETECTED NOT DETECTED Final   Respiratory Syncytial Virus NOT DETECTED NOT DETECTED Final   Bordetella pertussis NOT DETECTED NOT DETECTED Final   Chlamydophila pneumoniae NOT DETECTED NOT DETECTED Final   Mycoplasma pneumoniae NOT DETECTED NOT DETECTED Final    Comment: Performed at [facility], [place]. [address], [place], [place] [address]  Urine culture     Status: None   Collection Time: 12/16/19  5:23 AM   Specimen: In/Out Cath Urine  Result Value Ref Range Status   Specimen Description IN/OUT CATH URINE  Final   Special Requests NONE  Final   Culture   Final    NO GROWTH Performed at [facility], [place] [address], [place], [place] [address]    Report Status 12/17/2019 FINAL  Final         Radiology Studies: CT ABDOMEN PELVIS W CONTRAST  Result Date: 12/17/2019 CLINICAL DATA:  Follow-up metastatic colon carcinoma. Ongoing chemotherapy. E coli bacteremia. EXAM: CT ABDOMEN AND PELVIS WITH CONTRAST TECHNIQUE: Multidetector CT imaging of the abdomen and pelvis was performed using the standard protocol following bolus administration of intravenous contrast. CONTRAST:  153mL OMNIPAQUE IOHEXOL 300 MG/ML  SOLN COMPARISON:  10/10/2019 FINDINGS: Lower Chest: No acute findings. Hepatobiliary: No hepatic masses identified. Gallbladder is unremarkable. No evidence of biliary ductal dilatation. Pancreas:  No mass or inflammatory changes. Spleen: Within normal limits in size and appearance. Adrenals/Urinary Tract: No masses identified. A 3 mm calculus is again seen in the midpole of the right kidney. No evidence of ureteral calculi or hydronephrosis.  Unremarkable unopacified urinary bladder. Stomach/Bowel: Moderate sigmoid diverticulitis is seen. No evidence of extraluminal air or  abscess. Vascular/Lymphatic: Mild lymphadenopathy is again seen in the right retrocrural region, aortocaval and left paraaortic regions and left common iliac chain. Index lymph node in the aortocaval space currently measures 14 mm on image 35/3, compared to 16 mm previously. Another index lymph node in the left paraaortic region currently measures 9 mm on image 34/3, compared to 12 mm previously. Sub-centimeter perirectal lymph nodes are also decreased compared to MRI on 10/10/2019. No new or increased sites of lymphadenopathy identified. No abdominal aortic aneurysm. Aortic atherosclerosis noted. Reproductive:  No mass or other significant abnormality. Other:  None. Musculoskeletal:  No suspicious bone lesions identified. IMPRESSION: Moderate sigmoid diverticulitis. No evidence of abscess or other complication. Mild decrease in mild abdominal and pelvic lymphadenopathy, consistent with improving metastatic disease. No new or progressive metastatic disease identified. Tiny right renal calculus. No evidence of ureteral calculi or hydronephrosis. Aortic Atherosclerosis (ICD10-I70.0). Electronically Signed   By: Marlaine Hind M.D.   On: 12/17/2019 10:40        Scheduled Meds: . cephALEXin  1,000 mg Oral Q8H  . Chlorhexidine Gluconate Cloth  6 each Topical Daily  . clopidogrel  75 mg Oral Daily  . diltiazem  180 mg Oral Daily  . ezetimibe  10 mg Oral QHS  . insulin aspart  0-5 Units Subcutaneous QHS  . insulin aspart  0-9 Units Subcutaneous TID WC  . metoprolol succinate  100 mg Oral BID  . rosuvastatin  20 mg Oral QHS  . sertraline  50 mg Oral Daily   Continuous Infusions:    LOS: 1 day    Time spent: 35 minutes spent on chart review, discussion with nursing staff, consultants, updating family and interview/physical exam; more than 50% of that time was spent in counseling and/or coordination of care.    Eric J [place] DO [facility] Available via Epic secure chat 7am-7pm After  these hours, please refer to coverage provider listed on [email or web address] 12/18/2019, 2:32 PM

## 2019-12-18 NOTE — Progress Notes (Signed)
[place] for Heparin Indication: atrial fibrillation  Allergies  Allergen Reactions  . Ibuprofen Other (See Comments)    Was told to not take this   . Metformin And Related Other (See Comments)    Bloody stools   . Naproxen Other (See Comments)    Was told to not take this   . [place] Venom [Bee Venom] Swelling and Other (See Comments)    Severe swelling where stung  . Penicillins Hives    Did it involve swelling of the face/tongue/throat, SOB, or low BP? Unk Did it involve sudden or severe rash/hives, skin peeling, or any reaction on the inside of your mouth or nose? Yes Did you need to seek medical attention at a hospital or doctor's office? Unk When did it last happen?"Childhood- 55 years ago" If all above answers are "NO", may proceed with cephalosporin use.   . Celexa [Citalopram]     Contraindicated with A-Fib    Patient Measurements: Height: 5\' 10"  (177.8 cm) Weight: 93 kg (205 lb 0.4 oz) IBW/kg (Calculated) : 73 Heparin Dosing Weight: 90 kg  Vital Signs: Temp: 98.7 F (37.1 C) (08/20 0039) Temp Source: Oral (08/20 0039) BP: 136/86 (08/20 0039) Pulse Rate: 98 (08/20 0039)  Labs: Recent Labs    12/16/19 0110 12/16/19 1811 12/17/19 0818 12/17/19 1811 12/18/19 0238  HGB 10.8*  --  9.1*  --  9.0*  HCT 32.6*  --  27.1*  --  25.8*  PLT 214  --  163  --  143*  LABPROT 15.8*  --   --   --   --   INR 1.3*  --   --   --   --   HEPARINUNFRC  --    < > 0.14* 0.16* 0.23*  CREATININE 1.25*  --  1.21  --   --    < > = values in this interval not displayed.    Estimated Creatinine Clearance: 69.7 mL/min (by C-G formula based on SCr of 1.21 mg/dL).  Assessment: 64 yr old male with atrial fibrillation, on heparin (not on anticoagulation PTA).  Heparin level is subtherapeutic at 0.23 on 2100 units/hr. No bleeding or problems with infusion noted by RN.   Goal of Therapy:  Heparin level 0.3-0.7 units/ml Monitor platelets  by anticoagulation protocol: Yes   Plan:  Increase heparin infusion to 2300 units/hr Check 6-hr heparin level Monitor daily heparin level, CBC Monitor for signs/symptoms of bleeding  Thank you for involving pharmacy in this patient's care.  Renold Genta, PharmD, BCPS Clinical Pharmacist 12/18/2019 3:12 AM  **Pharmacist phone directory can be found on [email or web address] listed under [place]**

## 2019-12-18 NOTE — Progress Notes (Signed)
[name] is having a lot of loose stool.  He said he has been going all night long.  He is on Flagyl.  He is on Rocephin.  He has a E. coli in his blood.  The sensitivities are not back out.  We did go ahead and do his CT scan of the abdomen pelvis.  There is no infectious process.  The underlying malignancy was improving a little bit.  He has had no obvious bleeding.  He is on a heparin infusion.  He has had no fever.  His white cell count has jumped up quite a bit from the Neupogen.  His white cell count today is 17.4.  Hemoglobin is 9 and his platelet count is 143,000.  I suspect he probably has some iron deficiency.  I will give him a dose of IV iron.  He has not had a temperature.  This morning, temperature is 98.4.  Pulse 98.  Blood pressure 111/84.  His weight no change in his physical exam from yesterday.  Thankfully, he has not neutropenic.  I suspect that this E. coli somehow came from his abdominal cavity.  He has the loose stools.  He is on Flagyl and Rocephin.  I know he is getting wonderful care from the staff upon [facility].  I appreciate all that everyone is doing to help John Douglas.  If there is something oral he can take for the E. coli, hopefully he can go home this weekend.  Lattie Haw, MD  Oswaldo Milian 26:4

## 2019-12-19 LAB — CEA: CEA: 6.7 ng/mL — ABNORMAL HIGH (ref 0.0–4.7)

## 2019-12-19 LAB — GLUCOSE, CAPILLARY
Glucose-Capillary: 130 mg/dL — ABNORMAL HIGH (ref 70–99)
Glucose-Capillary: 123 mg/dL — ABNORMAL HIGH (ref 70–99)

## 2019-12-19 MED ORDER — CEPHALEXIN 500 MG PO CAPS: 1000.0000 mg | ORAL_CAPSULE | Freq: Three times a day (TID) | ORAL | 0 refills | Status: AC

## 2019-12-19 MED ORDER — HEPARIN SOD (PORK) LOCK FLUSH 100 UNIT/ML IV SOLN
500.0000 [IU] | INTRAVENOUS | Status: AC | PRN
  Administered 2019-12-19: 500 [IU]
  Filled 2019-12-19: qty 5

## 2019-12-19 MED ORDER — SODIUM CHLORIDE 0.9% FLUSH
10.0000 mL | Freq: Two times a day (BID) | INTRAVENOUS | Status: DC
  Administered 2019-12-19: 10 mL

## 2019-12-19 MED ORDER — SODIUM CHLORIDE 0.9% FLUSH
10.0000 mL | INTRAVENOUS | Status: DC | PRN
  Administered 2019-12-19: 10 mL

## 2019-12-19 NOTE — Discharge Instructions (Signed)
Diverticulitis  Diverticulitis is infection or inflammation of small pouches (diverticula) in the colon that form due to a condition called diverticulosis. Diverticula can trap stool (feces) and bacteria, causing infection and inflammation. Diverticulitis may cause severe stomach pain and diarrhea. It may lead to tissue damage in the colon that causes bleeding. The diverticula may also burst (rupture) and cause infected stool to enter other areas of the abdomen. Complications of diverticulitis can include:  Bleeding.  Severe infection.  Severe pain.  Rupture (perforation) of the colon.  Blockage (obstruction) of the colon. What are the causes? This condition is caused by stool becoming trapped in the diverticula, which allows bacteria to grow in the diverticula. This leads to inflammation and infection. What increases the risk? You are more likely to develop this condition if:  You have diverticulosis. The risk for diverticulosis increases if: ? You are overweight or obese. ? You use tobacco products. ? You do not get enough exercise.  You eat a diet that does not include enough fiber. High-fiber foods include fruits, vegetables, beans, nuts, and whole grains. What are the signs or symptoms? Symptoms of this condition may include:  Pain and tenderness in the abdomen. The pain is normally located on the left side of the abdomen, but it may occur in other areas.  Fever and chills.  Bloating.  Cramping.  Nausea.  Vomiting.  Changes in bowel routines.  Blood in your stool. How is this diagnosed? This condition is diagnosed based on:  Your medical history.  A physical exam.  Tests to make sure there is nothing else causing your condition. These tests may include: ? Blood tests. ? Urine tests. ? Imaging tests of the abdomen, including X-rays, ultrasounds, MRIs, or CT scans. How is this treated? Most cases of this condition are mild and can be treated at home.  Treatment may include:  Taking over-the-counter pain medicines.  Following a clear liquid diet.  Taking antibiotic medicines by mouth.  Rest. More severe cases may need to be treated at a hospital. Treatment may include:  Not eating or drinking.  Taking prescription pain medicine.  Receiving antibiotic medicines through an IV tube.  Receiving fluids and nutrition through an IV tube.  Surgery. When your condition is under control, your health care provider may recommend that you have a colonoscopy. This is an exam to look at the entire large intestine. During the exam, a lubricated, bendable tube is inserted into the anus and then passed into the rectum, colon, and other parts of the large intestine. A colonoscopy can show how severe your diverticula are and whether something else may be causing your symptoms. Follow these instructions at home: Medicines  Take over-the-counter and prescription medicines only as told by your health care provider. These include fiber supplements, probiotics, and stool softeners.  If you were prescribed an antibiotic medicine, take it as told by your health care provider. Do not stop taking the antibiotic even if you start to feel better.  Do not drive or use heavy machinery while taking prescription pain medicine. General instructions   Follow a full liquid diet or another diet as directed by your health care provider. After your symptoms improve, your health care provider may tell you to change your diet. He or she may recommend that you eat a diet that contains at least 25 g (25 grams) of fiber daily. Fiber makes it easier to pass stool. Healthy sources of fiber include: ? Berries. One cup contains 4-8 grams of   fiber. ? Beans or lentils. One half cup contains 5-8 grams of fiber. ? Green vegetables. One cup contains 4 grams of fiber.  Exercise for at least 30 minutes, 3 times each week. You should exercise hard enough to raise your heart rate and  break a sweat.  Keep all follow-up visits as told by your health care provider. This is important. You may need a colonoscopy. Contact a health care provider if:  Your pain does not improve.  You have a hard time drinking or eating food.  Your bowel movements do not return to normal. Get help right away if:  Your pain gets worse.  Your symptoms do not get better with treatment.  Your symptoms suddenly get worse.  You have a fever.  You vomit more than one time.  You have stools that are bloody, black, or tarry. Summary  Diverticulitis is infection or inflammation of small pouches (diverticula) in the colon that form due to a condition called diverticulosis. Diverticula can trap stool (feces) and bacteria, causing infection and inflammation.  You are at higher risk for this condition if you have diverticulosis and you eat a diet that does not include enough fiber.  Most cases of this condition are mild and can be treated at home. More severe cases may need to be treated at a hospital.  When your condition is under control, your health care provider may recommend that you have an exam called a colonoscopy. This exam can show how severe your diverticula are and whether something else may be causing your symptoms. This information is not intended to replace advice given to you by your health care provider. Make sure you discuss any questions you have with your health care provider. Document Revised: 03/29/2017 Document Reviewed: 05/19/2016 Else[employer] Patient Education  [place]. Bacteremia, Adult Bacteremia is the presence of bacteria in the blood. When bacteria enter the bloodstream, they can cause a life-threatening reaction called sepsis. Sepsis is a medical emergency. What are the causes? This condition is caused by bacteria that get into the blood. Bacteria can enter the blood from an infection, including:  A skin infection or injury, such as a burn or a cut.  A  lung infection (pneumonia).  An infection in the stomach or intestines.  An infection in the bladder or urinary system (urinary tract infection).  A bacterial infection in another part of the body that spreads to the blood. Bacteria can also enter the blood during a dental or medical procedure, from bleeding gums, or through use of an unclean needle. What increases the risk? This condition is more likely to develop in children, older adults, and people who have:  A long-term (chronic) disease or condition like diabetes or chronic kidney failure.  An artificial joint or heart valve, or heart valve disease.  A tube inserted to treat a medical condition, such as a urinary catheter or IV.  A weak disease-fighting system (immune system).  Injected illegal drugs.  Been hospitalized for more than 10 days in a row. What are the signs or symptoms? Symptoms of this condition include:  Fever and chills.  Fast heartbeat and shortness of breath.  Dizziness, weakness, and low blood pressure.  Confusion or anxiety.  Pain in the abdomen, nausea, vomiting, and diarrhea. Bacteremia that has spread to other parts of the body may cause symptoms in those areas. In some cases, there are no symptoms. How is this diagnosed? This condition may be diagnosed with a physical exam and tests,  such as:  Blood tests to check for bacteria (cultures) or other signs of infection.  Tests of any tubes that you have had inserted. These tests check for a source of infection.  Urine tests to check for bacteria in the urine.  Imaging tests, such as an X-ray, a CT scan, an MRI, or a heart ultrasound. These check for a source of infection in other parts of your body, such as your lungs, heart valves, or joints. How is this treated? This condition is usually treated in the hospital. If you are treated at home, you may need to return to the hospital for medicines, blood tests, and evaluation. Treatment may  include:  Antibiotic medicines. These may be given by mouth or directly into your blood through an IV. You may need antibiotics for several weeks. At first, you may be given an antibiotic to kill most types of blood bacteria. If tests show that a certain kind of bacteria is causing the problem, you may be given a different antibiotic.  IV fluids.  Removing any catheter or device that could be a source of infection.  Blood pressure and breathing support, if needed.  Surgery to control the source or the spread of infection, such as surgery to remove an implanted device, abscess, or infected tissue. Follow these instructions at home: Medicines  Take over-the-counter and prescription medicines only as told by your health care provider.  If you were prescribed an antibiotic medicine, take it as told by your health care provider. Do not stop taking the antibiotic even if you start to feel better. General instructions   Rest as needed. Ask your health care provider when you may return to normal activities.  Drink enough fluid to keep your urine pale yellow.  Do not use any products that contain nicotine or tobacco, such as cigarettes, e-cigarettes, and chewing tobacco. If you need help quitting, ask your health care provider.  Keep all follow-up visits as told by your health care provider. This is important. How is this prevented?   Wash your hands regularly with soap and water. If soap and water are not available, use hand sanitizer.  You should wash your hands: ? After using the toilet or changing a diaper. ? Before preparing, cooking, serving, or eating food. ? While caring for a sick person or while visiting someone in a hospital. ? Before and after changing bandages (dressings) over wounds.  Clean any scrapes or cuts with soap and water and cover them with a clean bandage.  Get vaccinations as recommended by your health care provider.  Practice good oral hygiene. Brush your  teeth two times a day, and floss regularly.  Take good care of your skin. This includes bathing and moisturizing on a regular basis. Contact a health care provider if:  Your symptoms get worse, and medicines do not help.  You have severe pain. Get help right away if you have:  Pain.  A fever or chills.  Trouble breathing.  A fast heart rate.  Skin that is blotchy, pale, or clammy.  Confusion.  Weakness.  Lack of energy or unusual sleepiness.  New symptoms that develop after treatment has started. These symptoms may represent a serious problem that is an emergency. Do not wait to see if the symptoms will go away. Get medical help right away. Call your local emergency services (911 in the [place] Do not drive yourself to the hospital. Summary  Bacteremia is the presence of bacteria in the blood. When bacteria  enter the bloodstream, they can cause a life-threatening reaction called sepsis.  Bacteremia is usually treated with antibiotic medicines in the hospital.  If you were prescribed an antibiotic medicine, take it as told by your health care provider. Do not stop taking the antibiotic even if you start to feel better.  Get help right away if you have any new symptoms that develop after treatment has started. This information is not intended to replace advice given to you by your health care provider. Make sure you discuss any questions you have with your health care provider. Document Revised: 09/05/2018 Document Reviewed: 09/05/2018 Elsevier Patient Education  [place].

## 2019-12-19 NOTE — Discharge Summary (Signed)
Physician Discharge Summary  John Douglas STM:[id number] DOB: Jul 24, 1955 DOA: 12/16/2019  PCP: Leonides Sake, MD  Admit date: 12/16/2019 Discharge date: 12/19/2019  Admitted From: Home Disposition: Home  Recommendations for Outpatient Follow-up:  1. Follow up with PCP in 1-2 weeks 2. Continue antibiotics with Keflex 1000 mg p.o. 3 times daily to complete 10-day course for E. coli septicemia/acute sigmoid diverticulitis 3. Will need outpatient referral to gastroenterology for consideration of colonoscopy 4-6 weeks; also consideration for EGD given mucosal changes noted on CT scan. 4. Continue outpatient follow-up with medical oncology  Home Health: No Equipment/Devices: None  Discharge Condition: Stable CODE STATUS: Full code Diet recommendation: Heart healthy/consistent carbohydrate diet  History of present illness:  John Douglas is a 64 y.o.malewith medical history significant forrectal cancer recently started on chemotherapy, hypertension, CAD, previous MI, diabetes mellitus, previous stroke who presents for evaluation of fever and chills. He reports he went to dinner with some friends as and on the way home he developed chills and when he got home his temperature was 101 degrees. His daughter was with him and became concerned and called her oncologist to advised him to come to the emergency room for evaluation. When he arrived in the emergency room temperature was 103 degrees. He continued to have chills but did not have any cough, chest pain, palpitations, nausea, vomiting, diarrhea, abdominal pain, urinary frequency or dysuria. He has not had any rash. He denies any known sick contacts. He did have his COVID-19 vaccine in June. Reports he has never had DVT or PE and has not had any prolonged immobilization or recent travel.   In the ED, temperature 103. Given Tylenol and fever has broken. Noted to have decreased white blood cell count with normal neutrophil value. Chest  x-ray did not reveal any pneumonia. Urinalysis is negative for infection. No source of fever identified. ER provider discussed with oncology who recommended patient stay for empiric antibiotics while cultures are pending.  Hospital course:  E. coli septicemia Acute sigmoid diverticulitis Patient presenting to the ED with chills and fever.  Reports was having dinner when driving home with symptom onset.  Temperature 103.1 on presentation.  Lactic acid mildly elevated at 1.5.  Patient with leukopenia without neutropenia, likely secondary to active chemotherapy.  Covid-19 PCR negative.  Respiratory viral panel negative.  Chest x-ray with no acute cardiopulmonary disease process.  Urinalysis unrevealing and urine culture with no growth.  CT abdomen/pelvis notable for moderate sigmoid diverticulitis without perforation or abscess. Blood cultures x 2 positive E. coli, only resistant to Cipro.  Patient was initially started on ceftriaxone and transition to Keflex 1000 mg p.o. 3 times daily to complete a 10-day course.  Patient has been afebrile past 24 hours.  Will need outpatient referral to gastroenterology for consideration of colonoscopy in 4-6 weeks for acute sigmoid diverticulitis.  Hypokalemia Repleted during hospitalization.  Leukopenia secondary to antineoplastic chemotherapy WBC count of 2.5; neutrophils 87; no neutropenia; on admission. Received Granix on 12/17/2019; WBC up to 17.4.  Outpatient follow-up with medical oncology.  Mucosal changes distal esophagus Incidental finding of mildly thickened distal esophagus on CT angiogram chest.  Recommend outpatient follow-up/referral to gastroenterology for consideration of EGD for further evaluation.  Paroxysmal atrial fibrillation Essential hypertension Continue diltiazem 180 mg p.o. daily and metoprolol succinate 100 mg p.o. twice daily. Currently not on anticoagulation per oncology  Type 2 diabetes mellitus Hemoglobin A1c 8.0 on  10/09/2019.    Continue home Amaryl 2 mg p.o. daily.  CAD Continue  Plavix 75 mg p.o. daily and statin  HLD: Continue Crestor 20 mg p.o. daily and Zetia 10 mg p.o. 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 Marin Olp.  Has completed 2 courses of chemotherapy, last chemotherapy 1 week prior.  CEA has improved from 15.8 to 6.7. 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)   Fever in adult   Leukopenia due to antineoplastic chemotherapy [facility])   Acute diverticulitis    Discharge Instructions  Discharge Instructions    Call MD for:  difficulty breathing, headache or visual disturbances   Complete by: As directed    Call MD for:  extreme fatigue   Complete by: As directed    Call MD for:  persistant dizziness or light-headedness   Complete by: As directed    Call MD for:  persistant nausea and vomiting   Complete by: As directed    Call MD for:  severe uncontrolled pain   Complete by: As directed    Call MD for:  temperature >100.4   Complete by: As directed    Diet - low sodium heart healthy   Complete by: As directed    Increase activity slowly   Complete by: As directed      Allergies as of 12/19/2019      Reactions   Ibuprofen Other (See Comments)   Was told to not take this    Metformin And Related Other (See Comments)   Bloody stools    Naproxen Other (See Comments)   Was told to not take this    Yellow Jacket Venom [bee Venom] Swelling, Other (See Comments)   Severe swelling where stung   Penicillins Hives   Did it involve swelling of the face/tongue/throat, SOB, or low BP? Unk Did it involve sudden or severe rash/hives, skin peeling, or  any reaction on the inside of your mouth or nose? Yes Did you need to seek medical attention at a hospital or doctor's office? Unk When did it last happen?"Childhood- 55 years ago" If all above answers are "NO", may proceed with cephalosporin use.   Celexa [citalopram]    Contraindicated with A-Fib      Medication List    STOP taking these medications   lidocaine-prilocaine cream Commonly known as: EMLA   LORazepam 0.5 MG tablet Commonly known as: Ativan     TAKE these medications   acetaminophen 500 MG tablet Commonly known as: TYLENOL Take 500 mg by mouth every 6 (six) hours as needed for fever. What changed: Another medication with the same name was removed. Continue taking this medication, and follow the directions you see here.   cephALEXin 500 MG capsule Commonly known as: KEFLEX Take 2 capsules (1,000 mg total) by mouth every 8 (eight) hours for 8 days.   clonazePAM 1 MG disintegrating tablet Commonly known as: KLONOPIN Take 1 tablet (1 mg total) by mouth 2 (two) times daily as needed. What changed: reasons to take this   clopidogrel 75 MG tablet Commonly known as: PLAVIX Take 1 tablet (75 mg total) by mouth daily.   diltiazem 180 MG 24 hr capsule Commonly known as: CARDIZEM CD TAKE 1 CAPSULE BY MOUTH EVERY DAY What changed: how much to take  ezetimibe 10 MG tablet Commonly known as: ZETIA Take 10 mg by mouth at bedtime.   fluticasone 50 MCG/ACT nasal spray Commonly known as: FLONASE Place 1-2 sprays into both nostrils as needed (for seasonal allergies).   gabapentin 100 MG capsule Commonly known as: NEURONTIN TAKE 1 CAPSULE BY MOUTH AT BEDTIME.   glimepiride 2 MG tablet Commonly known as: AMARYL Take 1 tablet (2 mg total) by mouth daily with breakfast.   glucose blood test strip Monitor BS before meals and at bedtime for a couple of weeks. Then may decrease to bid before meals   metoprolol succinate 100 MG 24 hr tablet Commonly known as: Toprol  XL Take 1 tablet (100 mg total) by mouth in the morning and at bedtime.   nitroGLYCERIN 0.4 MG SL tablet Commonly known as: NITROSTAT Place 0.4 mg under the tongue every 5 (five) minutes as needed for chest pain.   ondansetron 8 MG tablet Commonly known as: Zofran Take 1 tablet (8 mg total) by mouth 2 (two) times daily as needed for refractory nausea / vomiting. Start on day 3 after chemotherapy.   onetouch ultrasoft lancets Use as instructed   prochlorperazine 10 MG tablet Commonly known as: COMPAZINE Take 1 tablet (10 mg total) by mouth every 6 (six) hours as needed (Nausea or vomiting). What changed: reasons to take this   rosuvastatin 20 MG tablet Commonly known as: CRESTOR Take 1 tablet (20 mg total) by mouth daily. What changed: when to take this   sertraline 50 MG tablet Commonly known as: ZOLOFT Take 50 mg by mouth daily.   traZODone 50 MG tablet Commonly known as: DESYREL TAKE 1 TABLET (50 MG TOTAL) BY MOUTH AT BEDTIME AS NEEDED FOR SLEEP.       Follow-up Information    Hamrick, Lorin Mercy, MD. Schedule an appointment as soon as possible for a visit in 1 week(s).   Specialty: Family Medicine Contact information: [place] [address] [phone]        Nelva Bush, MD .   Specialty: Cardiology Contact information: [address] [place] [address] [phone]              Allergies  Allergen Reactions  . Ibuprofen Other (See Comments)    Was told to not take this   . Metformin And Related Other (See Comments)    Bloody stools   . Naproxen Other (See Comments)    Was told to not take this   . Yellow Jacket Venom [Bee Venom] Swelling and Other (See Comments)    Severe swelling where stung  . Penicillins Hives    Did it involve swelling of the face/tongue/throat, SOB, or low BP? Unk Did it involve sudden or severe rash/hives, skin peeling, or any reaction on the inside of your mouth or nose? Yes Did you  need to seek medical attention at a hospital or doctor's office? Unk When did it last happen?"Childhood- 55 years ago" If all above answers are "NO", may proceed with cephalosporin use.   Sheliah Hatch [Citalopram]     Contraindicated with A-Fib    Consultations:  Medical oncology, Dr. Marin Olp   Procedures/Studies: DG Chest 2 View  Result Date: 12/16/2019 CLINICAL DATA:  Suspected sepsis. EXAM: CHEST - 2 VIEW COMPARISON:  11/06/2019.  CT 10/10/2019 FINDINGS: Right chest port with tip in the mid SVC. Normal heart size and mediastinal contours. Coronary stent. No focal airspace disease. No pleural effusion. No pneumothorax. No pulmonary edema.  Degenerative change of the right shoulder. IMPRESSION: No acute chest finding.  Right chest port in place. Electronically Signed   By: Keith Rake M.D.   On: 12/16/2019 01:32   CT ANGIO CHEST PE W OR WO CONTRAST  Result Date: 12/16/2019 CLINICAL DATA:  Shortness of breath, fever for 3 days, history of cancer, recent stent placements as well. Stroke in the past year. Active colon cancer EXAM: CT ANGIOGRAPHY CHEST WITH CONTRAST TECHNIQUE: Multidetector CT imaging of the chest was performed using the standard protocol during bolus administration of intravenous contrast. Multiplanar CT image reconstructions and MIPs were obtained to evaluate the vascular anatomy. CONTRAST:  19mL OMNIPAQUE IOHEXOL 350 MG/ML SOLN COMPARISON:  CT 10/10/2019 FINDINGS: Cardiovascular: Satisfactory opacification of the pulmonary arteries though evaluation is limited by extensive respiratory and cardiac pulsation artifact. There is likely artifactual hypoattenuation in the lobar branch of the right middle lobe when viewed on coronal imaging without reliable correlate. No convincing central, lobar or proximal segmental filling defects are identified. Central pulmonary arteries are upper limits normal caliber. Mild cardiomegaly. Four-chamber cardiac enlargement is noted. Three-vessel  native coronary artery disease with stents in the right coronary and LAD. No pericardial effusion. Atherosclerotic plaque within the normal caliber aorta. No acute luminal abnormality of the aorta. No periaortic stranding or hemorrhage. Normal 3 vessel branching of the aortic arch with atheromatous plaque in the otherwise unremarkable proximal great vessels. A right IJ approach Port-A-Cath reservoir in the right chest wall, tip terminates at the superior cavoatrial junction. No major venous abnormalities are seen. Mediastinum/Nodes: No mediastinal fluid or gas. Normal thyroid gland and thoracic inlet. No acute abnormality of the trachea. Slightly patulous and mildly thickened distal thoracic esophagus with small hiatal hernia. No worrisome mediastinal, hilar or axillary adenopathy. Lungs/Pleura: Respiratory motion artifact limits evaluation of the lung parenchyma. Some mild airways thickening is noted which could reflect some bronchitic change. No consolidation, features of edema, pneumothorax, or effusion. No suspicious pulmonary nodules or masses. Upper Abdomen: No acute abnormalities present in the visualized portions of the upper abdomen. Small accessory splenule. Hiatal hernia, as above. Musculoskeletal: Scoliotic curvature of the upper to midthoracic spine is similar to prior with some mildly exaggerated thoracic kyphotic curvature as well. Minimal degenerative changes in the spine similar to priors. No acute osseous abnormality or suspicious osseous lesion. No worrisome chest wall lesions. Right Port-A-Cath, as above. Review of the MIP images confirms the above findings. IMPRESSION: 1. Study is limited by extensive respiratory and cardiac pulsation artifact. 2. No convincing evidence of acute pulmonary embolism to the lobar level. 3. Mild airways thickening may reflect some bronchitic change. No other acute intrathoracic process. 4. Cardiomegaly with three-vessel native coronary artery disease and stents in  the right coronary and LAD. 5. Small hiatal hernia with patulous and mildly thickened distal thoracic esophagus, correlate for features of esophagitis and consider correlation with outpatient endoscopy. 6. Aortic Atherosclerosis (ICD10-I70.0). Electronically Signed   By: Lovena Le M.D.   On: 12/16/2019 05:58   CT ABDOMEN PELVIS W CONTRAST  Result Date: 12/17/2019 CLINICAL DATA:  Follow-up metastatic colon carcinoma. Ongoing chemotherapy. E coli bacteremia. EXAM: CT ABDOMEN AND PELVIS WITH CONTRAST TECHNIQUE: Multidetector CT imaging of the abdomen and pelvis was performed using the standard protocol following bolus administration of intravenous contrast. CONTRAST:  162mL OMNIPAQUE IOHEXOL 300 MG/ML  SOLN COMPARISON:  10/10/2019 FINDINGS: Lower Chest: No acute findings. Hepatobiliary: No hepatic masses identified. Gallbladder is unremarkable. No evidence of biliary ductal dilatation. Pancreas:  No mass  or inflammatory changes. Spleen: Within normal limits in size and appearance. Adrenals/Urinary Tract: No masses identified. A 3 mm calculus is again seen in the midpole of the right kidney. No evidence of ureteral calculi or hydronephrosis. Unremarkable unopacified urinary bladder. Stomach/Bowel: Moderate sigmoid diverticulitis is seen. No evidence of extraluminal air or abscess. Vascular/Lymphatic: Mild lymphadenopathy is again seen in the right retrocrural region, aortocaval and left paraaortic regions and left common iliac chain. Index lymph node in the aortocaval space currently measures 14 mm on image 35/3, compared to 16 mm previously. Another index lymph node in the left paraaortic region currently measures 9 mm on image 34/3, compared to 12 mm previously. Sub-centimeter perirectal lymph nodes are also decreased compared to MRI on 10/10/2019. No new or increased sites of lymphadenopathy identified. No abdominal aortic aneurysm. Aortic atherosclerosis noted. Reproductive:  No mass or other significant  abnormality. Other:  None. Musculoskeletal:  No suspicious bone lesions identified. IMPRESSION: Moderate sigmoid diverticulitis. No evidence of abscess or other complication. Mild decrease in mild abdominal and pelvic lymphadenopathy, consistent with improving metastatic disease. No new or progressive metastatic disease identified. Tiny right renal calculus. No evidence of ureteral calculi or hydronephrosis. Aortic Atherosclerosis (ICD10-I70.0). Electronically Signed   By: Marlaine Hind M.D.   On: 12/17/2019 10:40   IR IMAGING GUIDED PORT INSERTION  Result Date: 11/24/2019 INDICATION: 64 year old male with colonic adenocarcinoma. He presents for port catheter placement. EXAM: IMPLANTED PORT A CATH PLACEMENT WITH ULTRASOUND AND FLUOROSCOPIC GUIDANCE MEDICATIONS: 900 mg clindamycin; The antibiotic was administered within an appropriate time interval prior to skin puncture. ANESTHESIA/SEDATION: Versed 2 mg IV; Fentanyl 100 mcg IV; Moderate Sedation Time:  19 minutes The patient was continuously monitored during the procedure by the interventional radiology nurse under my direct supervision. FLUOROSCOPY TIME:  0 minutes, 12 seconds (5 mGy) COMPLICATIONS: None immediate. PROCEDURE: The right neck and chest was prepped with chlorhexidine, and draped in the usual sterile fashion using maximum barrier technique (cap and mask, sterile gown, sterile gloves, large sterile sheet, hand hygiene and cutaneous antiseptic). Local anesthesia was attained by infiltration with 1% lidocaine with epinephrine. Ultrasound demonstrated patency of the right internal jugular vein, and this was documented with an image. Under real-time ultrasound guidance, this vein was accessed with a 21 gauge micropuncture needle and image documentation was performed. A small dermatotomy was made at the access site with an 11 scalpel. A 0.018" wire was advanced into the SVC and the access needle exchanged for a 77F micropuncture vascular sheath. The 0.018"  wire was then removed and a 0.035" wire advanced into the IVC. An appropriate location for the subcutaneous reservoir was selected below the clavicle and an incision was made through the skin and underlying soft tissues. The subcutaneous tissues were then dissected using a combination of blunt and sharp surgical technique and a pocket was formed. A single lumen power injectable portacatheter was then tunneled through the subcutaneous tissues from the pocket to the dermatotomy and the port reservoir placed within the subcutaneous pocket. The venous access site was then serially dilated and a peel away vascular sheath placed over the wire. The wire was removed and the port catheter advanced into position under fluoroscopic guidance. The catheter tip is positioned in the superior cavoatrial junction. This was documented with a spot image. The portacatheter was then tested and found to flush and aspirate well. The port was flushed with saline followed by 100 units/mL heparinized saline. The pocket was then closed in two layers using first subdermal  inverted interrupted absorbable sutures followed by a running subcuticular suture. The epidermis was then sealed with Dermabond. The dermatotomy at the venous access site was also closed with Dermabond. IMPRESSION: Successful placement of a right IJ approach Power Port with ultrasound and fluoroscopic guidance. The catheter is ready for use. Electronically Signed   By: Jacqulynn Cadet M.D.   On: 11/24/2019 17:27      Subjective: Patient seen and examined bedside, resting comfortably.  Refused lab draw this morning.  Ready for discharge home.  Continues to be afebrile.  Tolerating Keflex.  No other complaints or concerns at this time.  Denies headache, no fever/chills/night sweats, no nausea/vomiting/diarrhea, no chest pain, palpitations, no abdominal pain, no weakness, no fatigue, no paresthesias.  No acute events overnight per nursing staff.  Discharge  Exam: Vitals:   12/19/19 0614 12/19/19 0919  BP: (!) 145/90 130/83  Pulse: 89 95  Resp: 16   Temp: 98.3 F (36.8 C)   SpO2: 97%    Vitals:   12/18/19 1819 12/19/19 0042 12/19/19 0614 12/19/19 0919  BP: 105/70 133/80 (!) 145/90 130/83  Pulse: 96 68 89 95  Resp: 17 16 16    Temp: 99.2 F (37.3 C) 98.8 F (37.1 C) 98.3 F (36.8 C)   TempSrc: Oral Oral Oral   SpO2: 98% 96% 97%   Weight:      Height:        General: Pt is alert, awake, not in acute distress Cardiovascular: RRR, S1/S2 +, no rubs, no gallops, port noted right upper chest Respiratory: CTA bilaterally, no wheezing, no rhonchi Abdominal: Soft, NT, ND, bowel sounds + Extremities: no edema, no cyanosis    The results of significant diagnostics from this hospitalization (including imaging, microbiology, ancillary and laboratory) are listed below for reference.     Microbiology: Recent Results (from the past 240 hour(s))  Culture, blood (Routine x 2)     Status: Abnormal   Collection Time: 12/16/19 12:45 AM   Specimen: BLOOD LEFT ARM  Result Value Ref Range Status   Specimen Description BLOOD LEFT ARM  Final   Special Requests   Final    BOTTLES DRAWN AEROBIC AND ANAEROBIC Blood Culture adequate volume   Culture  Setup Time   Final    GRAM NEGATIVE RODS IN BOTH AEROBIC AND ANAEROBIC BOTTLES Organism ID to follow Performed at [facility], [place] [address], [place], [place] [address]    Culture ESCHERICHIA COLI (A)  Final   Report Status 12/18/2019 FINAL  Final   Organism ID, Bacteria ESCHERICHIA COLI  Final      Susceptibility   Escherichia coli - MIC*    AMPICILLIN <=2 SENSITIVE Sensitive     CEFAZOLIN <=4 SENSITIVE Sensitive     CEFEPIME <=0.12 SENSITIVE Sensitive     CEFTAZIDIME <=1 SENSITIVE Sensitive     CEFTRIAXONE <=0.25 SENSITIVE Sensitive     CIPROFLOXACIN >=4 RESISTANT Resistant     GENTAMICIN <=1 SENSITIVE Sensitive     IMIPENEM <=0.25 SENSITIVE Sensitive     TRIMETH/SULFA <=20  SENSITIVE Sensitive     AMPICILLIN/SULBACTAM <=2 SENSITIVE Sensitive     PIP/TAZO <=4 SENSITIVE Sensitive     * ESCHERICHIA COLI  Blood Culture ID Panel (Reflexed)     Status: Abnormal   Collection Time: 12/16/19 12:45 AM  Result Value Ref Range Status   Enterococcus faecalis NOT DETECTED NOT DETECTED Final   Enterococcus Faecium NOT DETECTED NOT DETECTED Final   Listeria monocytogenes NOT DETECTED NOT DETECTED Final  Staphylococcus species NOT DETECTED NOT DETECTED Final   Staphylococcus aureus (BCID) NOT DETECTED NOT DETECTED Final   Staphylococcus epidermidis NOT DETECTED NOT DETECTED Final   Staphylococcus lugdunensis NOT DETECTED NOT DETECTED Final   Streptococcus species NOT DETECTED NOT DETECTED Final   Streptococcus agalactiae NOT DETECTED NOT DETECTED Final   Streptococcus pneumoniae NOT DETECTED NOT DETECTED Final   Streptococcus pyogenes NOT DETECTED NOT DETECTED Final   A.calcoaceticus-baumannii NOT DETECTED NOT DETECTED Final   Bacteroides fragilis NOT DETECTED NOT DETECTED Final   Enterobacterales DETECTED (A) NOT DETECTED Final    Comment: Enterobacterales represent a large order of gram negative bacteria, not a single organism. CRITICAL RESULT CALLED TO, READ BACK BY AND VERIFIED WITH: PHARMD AMANADA W. [phone] FCP    Enterobacter cloacae complex NOT DETECTED NOT DETECTED Final   Escherichia coli DETECTED (A) NOT DETECTED Final    Comment: CRITICAL RESULT CALLED TO, READ BACK BY AND VERIFIED WITH: PHARMD AMANADA W. [phone] FCP    Klebsiella aerogenes NOT DETECTED NOT DETECTED Final   Klebsiella oxytoca NOT DETECTED NOT DETECTED Final   Klebsiella pneumoniae NOT DETECTED NOT DETECTED Final   Proteus species NOT DETECTED NOT DETECTED Final   Salmonella species NOT DETECTED NOT DETECTED Final   Serratia marcescens NOT DETECTED NOT DETECTED Final   Haemophilus influenzae NOT DETECTED NOT DETECTED Final   Neisseria meningitidis NOT DETECTED NOT DETECTED Final    Pseudomonas aeruginosa NOT DETECTED NOT DETECTED Final   Stenotrophomonas maltophilia NOT DETECTED NOT DETECTED Final   Candida albicans NOT DETECTED NOT DETECTED Final   Candida auris NOT DETECTED NOT DETECTED Final   Candida glabrata NOT DETECTED NOT DETECTED Final   Candida krusei NOT DETECTED NOT DETECTED Final   Candida parapsilosis NOT DETECTED NOT DETECTED Final   Candida tropicalis NOT DETECTED NOT DETECTED Final   Cryptococcus neoformans/gattii NOT DETECTED NOT DETECTED Final   CTX-M ESBL NOT DETECTED NOT DETECTED Final   Carbapenem resistance IMP NOT DETECTED NOT DETECTED Final   Carbapenem resistance KPC NOT DETECTED NOT DETECTED Final   Carbapenem resistance NDM NOT DETECTED NOT DETECTED Final   Carbapenem resist OXA 48 LIKE NOT DETECTED NOT DETECTED Final   Carbapenem resistance VIM NOT DETECTED NOT DETECTED Final    Comment: Performed at [facility], [address], [place], [place] [address]  Culture, blood (Routine x 2)     Status: Abnormal   Collection Time: 12/16/19  1:08 AM   Specimen: BLOOD LEFT HAND  Result Value Ref Range Status   Specimen Description BLOOD LEFT HAND  Final   Special Requests   Final    BOTTLES DRAWN AEROBIC ONLY Blood Culture results may not be optimal due to an excessive volume of blood received in culture bottles   Culture  Setup Time   Final    GRAM NEGATIVE RODS AEROBIC BOTTLE ONLY CRITICAL RESULT CALLED TO, READ BACK BY AND VERIFIED WITH: PHARMD AMANADA W. [phone] FCP    Culture (A)  Final    ESCHERICHIA COLI SUSCEPTIBILITIES PERFORMED ON PREVIOUS CULTURE WITHIN THE LAST 5 DAYS. Performed at [facility], [place] [address], [place], [place] [address]    Report Status 12/18/2019 FINAL  Final  SARS Coronavirus 2 by RT PCR (hospital order, performed in [facility] hospital lab) Nasopharyngeal Nasopharyngeal Swab     Status: None   Collection Time: 12/16/19  1:25 AM   Specimen: Nasopharyngeal Swab  Result Value Ref Range  Status   SARS  Coronavirus 2 NEGATIVE NEGATIVE Final    Comment: (NOTE) SARS-CoV-2 target nucleic acids are NOT DETECTED.  The SARS-CoV-2 RNA is generally detectable in upper and lower respiratory specimens during the acute phase of infection. The lowest concentration of SARS-CoV-2 viral copies this assay can detect is 250 copies / mL. A negative result does not preclude SARS-CoV-2 infection and should not be used as the sole basis for treatment or other patient management decisions.  A negative result may occur with improper specimen collection / handling, submission of specimen other than nasopharyngeal swab, presence of viral mutation(s) within the areas targeted by this assay, and inadequate number of viral copies (<250 copies / mL). A negative result must be combined with clinical observations, patient history, and epidemiological information.  Fact Sheet for Patients:   StrictlyIdeas.no  Fact Sheet for Healthcare Providers: BankingDealers.co.za  This test is not yet approved or  cleared by the [place] FDA and has been authorized for detection and/or diagnosis of SARS-CoV-2 by FDA under an Emergency Use Authorization (EUA).  This EUA will remain in effect (meaning this test can be used) for the duration of the COVID-19 declaration under Section 564(b)(1) of the Act, 21 U.S.C. section 360bbb-3(b)(1), unless the authorization is terminated or revoked sooner.  Performed at [facility], [place] [address], [place], [place] [address]   Respiratory Panel by PCR     Status: None   Collection Time: 12/16/19  4:32 AM   Specimen: Nasopharyngeal Swab; Respiratory  Result Value Ref Range Status   Adenovirus NOT DETECTED NOT DETECTED Final   Coronavirus 229E NOT DETECTED NOT DETECTED Final    Comment: (NOTE) The Coronavirus on the Respiratory Panel, DOES NOT test for the novel  Coronavirus (2019 nCoV)    Coronavirus HKU1 NOT  DETECTED NOT DETECTED Final   Coronavirus NL63 NOT DETECTED NOT DETECTED Final   Coronavirus OC43 NOT DETECTED NOT DETECTED Final   Metapneumovirus NOT DETECTED NOT DETECTED Final   Rhinovirus / Enterovirus NOT DETECTED NOT DETECTED Final   Influenza A NOT DETECTED NOT DETECTED Final   Influenza B NOT DETECTED NOT DETECTED Final   Parainfluenza Virus 1 NOT DETECTED NOT DETECTED Final   Parainfluenza Virus 2 NOT DETECTED NOT DETECTED Final   Parainfluenza Virus 3 NOT DETECTED NOT DETECTED Final   Parainfluenza Virus 4 NOT DETECTED NOT DETECTED Final   Respiratory Syncytial Virus NOT DETECTED NOT DETECTED Final   Bordetella pertussis NOT DETECTED NOT DETECTED Final   Chlamydophila pneumoniae NOT DETECTED NOT DETECTED Final   Mycoplasma pneumoniae NOT DETECTED NOT DETECTED Final    Comment: Performed at [facility], [place]. [address], [place], [place] [address]  Urine culture     Status: None   Collection Time: 12/16/19  5:23 AM   Specimen: In/Out Cath Urine  Result Value Ref Range Status   Specimen Description IN/OUT CATH URINE  Final   Special Requests NONE  Final   Culture   Final    NO GROWTH Performed at [facility], [place] [address], [place],  [address]    Report Status 12/17/2019 FINAL  Final     Labs: BNP (last 3 results) No results for input(s): BNP in the last 8760 hours. Basic Metabolic Panel: Recent Labs  Lab 12/16/19 0110 12/16/19 0756 12/17/19 0818 12/18/19 0238  NA 138  --  136 134*  K 2.7* 4.2 3.7 3.4*  CL 106  --  103 102  CO2 24  --  27  24  GLUCOSE 141*  --  144* 146*  BUN 22  --  17 14  CREATININE 1.25*  --  1.21 1.06  CALCIUM 8.7*  --  8.3* 8.2*  MG 1.8 1.9 2.0  --    Liver Function Tests: Recent Labs  Lab 12/16/19 0110  AST 18  ALT 18  ALKPHOS 45  BILITOT 1.3*  PROT 6.0*  ALBUMIN 3.6   No results for input(s): LIPASE, AMYLASE in the last 168 hours. No results for input(s): AMMONIA in the last 168 hours. CBC: Recent Labs   Lab 12/16/19 0110 12/17/19 0818 12/18/19 0238  WBC 2.5* 8.3 17.4*  NEUTROABS 2.2  --   --   HGB 10.8* 9.1* 9.0*  HCT 32.6* 27.1* 25.8*  MCV 88.6 89.4 87.8  PLT 214 163 143*   Cardiac Enzymes: No results for input(s): CKTOTAL, CKMB, CKMBINDEX, TROPONINI in the last 168 hours. BNP: Invalid input(s): POCBNP CBG: Recent Labs  Lab 12/18/19 0556 12/18/19 1121 12/18/19 1556 12/18/19 2109 12/19/19 0614  GLUCAP 135* 136* 126* 163* 130*   D-Dimer No results for input(s): DDIMER in the last 72 hours. Hgb A1c No results for input(s): HGBA1C in the last 72 hours. Lipid Profile No results for input(s): CHOL, HDL, LDLCALC, TRIG, CHOLHDL, LDLDIRECT in the last 72 hours. Thyroid function studies No results for input(s): TSH, T4TOTAL, T3FREE, THYROIDAB in the last 72 hours.  Invalid input(s): FREET3 Anemia work up No results for input(s): VITAMINB12, FOLATE, FERRITIN, TIBC, IRON, RETICCTPCT in the last 72 hours. Urinalysis    Component Value Date/Time   COLORURINE YELLOW 12/16/2019 0058   APPEARANCEUR CLEAR 12/16/2019 0058   LABSPEC 1.020 12/16/2019 0058   PHURINE 5.0 12/16/2019 0058   GLUCOSEU NEGATIVE 12/16/2019 0058   HGBUR NEGATIVE 12/16/2019 0058   BILIRUBINUR NEGATIVE 12/16/2019 0058   KETONESUR NEGATIVE 12/16/2019 0058   PROTEINUR NEGATIVE 12/16/2019 0058   NITRITE NEGATIVE 12/16/2019 0058   LEUKOCYTESUR NEGATIVE 12/16/2019 0058   Sepsis Labs Invalid input(s): PROCALCITONIN,  WBC,  LACTICIDVEN Microbiology Recent Results (from the past 240 hour(s))  Culture, blood (Routine x 2)     Status: Abnormal   Collection Time: 12/16/19 12:45 AM   Specimen: BLOOD LEFT ARM  Result Value Ref Range Status   Specimen Description BLOOD LEFT ARM  Final   Special Requests   Final    BOTTLES DRAWN AEROBIC AND ANAEROBIC Blood Culture adequate volume   Culture  Setup Time   Final    GRAM NEGATIVE RODS IN BOTH AEROBIC AND ANAEROBIC BOTTLES Organism ID to follow Performed at [facility], [place] [address] [place], [place] [address]    Culture ESCHERICHIA COLI (A)  Final   Report Status 12/18/2019 FINAL  Final   Organism ID, Bacteria ESCHERICHIA COLI  Final      Susceptibility   Escherichia coli - MIC*    AMPICILLIN <=2 SENSITIVE Sensitive     CEFAZOLIN <=4 SENSITIVE Sensitive     CEFEPIME <=0.12 SENSITIVE Sensitive     CEFTAZIDIME <=1 SENSITIVE Sensitive     CEFTRIAXONE <=0.25 SENSITIVE Sensitive     CIPROFLOXACIN >=4 RESISTANT Resistant     GENTAMICIN <=1 SENSITIVE Sensitive     IMIPENEM <=0.25 SENSITIVE Sensitive     TRIMETH/SULFA <=20 SENSITIVE Sensitive     AMPICILLIN/SULBACTAM <=2 SENSITIVE Sensitive     PIP/TAZO <=4 SENSITIVE Sensitive     * ESCHERICHIA COLI  Blood Culture ID Panel (Reflexed)     Status: Abnormal   Collection Time:  12/16/19 12:45 AM  Result Value Ref Range Status   Enterococcus faecalis NOT DETECTED NOT DETECTED Final   Enterococcus Faecium NOT DETECTED NOT DETECTED Final   Listeria monocytogenes NOT DETECTED NOT DETECTED Final   Staphylococcus species NOT DETECTED NOT DETECTED Final   Staphylococcus aureus (BCID) NOT DETECTED NOT DETECTED Final   Staphylococcus epidermidis NOT DETECTED NOT DETECTED Final   Staphylococcus lugdunensis NOT DETECTED NOT DETECTED Final   Streptococcus species NOT DETECTED NOT DETECTED Final   Streptococcus agalactiae NOT DETECTED NOT DETECTED Final   Streptococcus pneumoniae NOT DETECTED NOT DETECTED Final   Streptococcus pyogenes NOT DETECTED NOT DETECTED Final   A.calcoaceticus-baumannii NOT DETECTED NOT DETECTED Final   Bacteroides fragilis NOT DETECTED NOT DETECTED Final   Enterobacterales DETECTED (A) NOT DETECTED Final    Comment: Enterobacterales represent a large order of gram negative bacteria, not a single organism. CRITICAL RESULT CALLED TO, READ BACK BY AND VERIFIED WITH: PHARMD AMANADA W. [phone] FCP    Enterobacter cloacae complex NOT DETECTED NOT DETECTED Final   Escherichia  coli DETECTED (A) NOT DETECTED Final    Comment: CRITICAL RESULT CALLED TO, READ BACK BY AND VERIFIED WITH: PHARMD AMANADA W. [phone] FCP    Klebsiella aerogenes NOT DETECTED NOT DETECTED Final   Klebsiella oxytoca NOT DETECTED NOT DETECTED Final   Klebsiella pneumoniae NOT DETECTED NOT DETECTED Final   Proteus species NOT DETECTED NOT DETECTED Final   Salmonella species NOT DETECTED NOT DETECTED Final   Serratia marcescens NOT DETECTED NOT DETECTED Final   Haemophilus influenzae NOT DETECTED NOT DETECTED Final   Neisseria meningitidis NOT DETECTED NOT DETECTED Final   Pseudomonas aeruginosa NOT DETECTED NOT DETECTED Final   Stenotrophomonas maltophilia NOT DETECTED NOT DETECTED Final   Candida albicans NOT DETECTED NOT DETECTED Final   Candida auris NOT DETECTED NOT DETECTED Final   Candida glabrata NOT DETECTED NOT DETECTED Final   Candida krusei NOT DETECTED NOT DETECTED Final   Candida parapsilosis NOT DETECTED NOT DETECTED Final   Candida tropicalis NOT DETECTED NOT DETECTED Final   Cryptococcus neoformans/gattii NOT DETECTED NOT DETECTED Final   CTX-M ESBL NOT DETECTED NOT DETECTED Final   Carbapenem resistance IMP NOT DETECTED NOT DETECTED Final   Carbapenem resistance KPC NOT DETECTED NOT DETECTED Final   Carbapenem resistance NDM NOT DETECTED NOT DETECTED Final   Carbapenem resist OXA 48 LIKE NOT DETECTED NOT DETECTED Final   Carbapenem resistance VIM NOT DETECTED NOT DETECTED Final    Comment: Performed at [facility], [address], [place], [place] [address]  Culture, blood (Routine x 2)     Status: Abnormal   Collection Time: 12/16/19  1:08 AM   Specimen: BLOOD LEFT HAND  Result Value Ref Range Status   Specimen Description BLOOD LEFT HAND  Final   Special Requests   Final    BOTTLES DRAWN AEROBIC ONLY Blood Culture results may not be optimal due to an excessive volume of blood received in culture bottles   Culture  Setup Time   Final    GRAM NEGATIVE  RODS AEROBIC BOTTLE ONLY CRITICAL RESULT CALLED TO, READ BACK BY AND VERIFIED WITH: PHARMD AMANADA W. [phone] FCP    Culture (A)  Final    ESCHERICHIA COLI SUSCEPTIBILITIES PERFORMED ON PREVIOUS CULTURE WITHIN THE LAST 5 DAYS. Performed at [facility], [place] [address], [place], [place] [address]    Report Status 12/18/2019 FINAL  Final  SARS Coronavirus 2 by RT PCR (hospital  order, performed in [facility] hospital lab) Nasopharyngeal Nasopharyngeal Swab     Status: None   Collection Time: 12/16/19  1:25 AM   Specimen: Nasopharyngeal Swab  Result Value Ref Range Status   SARS Coronavirus 2 NEGATIVE NEGATIVE Final    Comment: (NOTE) SARS-CoV-2 target nucleic acids are NOT DETECTED.  The SARS-CoV-2 RNA is generally detectable in upper and lower respiratory specimens during the acute phase of infection. The lowest concentration of SARS-CoV-2 viral copies this assay can detect is 250 copies / mL. A negative result does not preclude SARS-CoV-2 infection and should not be used as the sole basis for treatment or other patient management decisions.  A negative result may occur with improper specimen collection / handling, submission of specimen other than nasopharyngeal swab, presence of viral mutation(s) within the areas targeted by this assay, and inadequate number of viral copies (<250 copies / mL). A negative result must be combined with clinical observations, patient history, and epidemiological information.  Fact Sheet for Patients:   StrictlyIdeas.no  Fact Sheet for Healthcare Providers: BankingDealers.co.za  This test is not yet approved or  cleared by the [place] FDA and has been authorized for detection and/or diagnosis of SARS-CoV-2 by FDA under an Emergency Use Authorization (EUA).  This EUA will remain in effect (meaning this test can be used) for the duration of the COVID-19 declaration under Section 564(b)(1)  of the Act, 21 U.S.C. section 360bbb-3(b)(1), unless the authorization is terminated or revoked sooner.  Performed at [facility], [place] [address], [place], [place] [address]   Respiratory Panel by PCR     Status: None   Collection Time: 12/16/19  4:32 AM   Specimen: Nasopharyngeal Swab; Respiratory  Result Value Ref Range Status   Adenovirus NOT DETECTED NOT DETECTED Final   Coronavirus 229E NOT DETECTED NOT DETECTED Final    Comment: (NOTE) The Coronavirus on the Respiratory Panel, DOES NOT test for the novel  Coronavirus (2019 nCoV)    Coronavirus HKU1 NOT DETECTED NOT DETECTED Final   Coronavirus NL63 NOT DETECTED NOT DETECTED Final   Coronavirus OC43 NOT DETECTED NOT DETECTED Final   Metapneumovirus NOT DETECTED NOT DETECTED Final   Rhinovirus / Enterovirus NOT DETECTED NOT DETECTED Final   Influenza A NOT DETECTED NOT DETECTED Final   Influenza B NOT DETECTED NOT DETECTED Final   Parainfluenza Virus 1 NOT DETECTED NOT DETECTED Final   Parainfluenza Virus 2 NOT DETECTED NOT DETECTED Final   Parainfluenza Virus 3 NOT DETECTED NOT DETECTED Final   Parainfluenza Virus 4 NOT DETECTED NOT DETECTED Final   Respiratory Syncytial Virus NOT DETECTED NOT DETECTED Final   Bordetella pertussis NOT DETECTED NOT DETECTED Final   Chlamydophila pneumoniae NOT DETECTED NOT DETECTED Final   Mycoplasma pneumoniae NOT DETECTED NOT DETECTED Final    Comment: Performed at [facility], [place]. [address] [place], [place] [address]  Urine culture     Status: None   Collection Time: 12/16/19  5:23 AM   Specimen: In/Out Cath Urine  Result Value Ref Range Status   Specimen Description IN/OUT CATH URINE  Final   Special Requests NONE  Final   Culture   Final    NO GROWTH Performed at [facility], [place] [address] [place], [place] [address]    Report Status 12/17/2019 FINAL  Final     Time coordinating discharge: Over 30 minutes  SIGNED:   Govani Radloff J [place] DO  [facility] 12/19/2019, 10:10 AM

## 2019-12-19 NOTE — Progress Notes (Signed)
Nsg Discharge Note  Patient's port deaccessed before discharge home. Girlfriend providing transportation.  Admit Date:  12/16/2019 Discharge date: 12/19/2019   Subhan Hoopes to be D/C'd Home per MD order.  AVS completed.  Copy for chart, and copy for patient signed, and dated. Patient/caregiver able to verbalize understanding.  Discharge Medication: Allergies as of 12/19/2019      Reactions   Ibuprofen Other (See Comments)   Was told to not take this    Metformin And Related Other (See Comments)   Bloody stools    Naproxen Other (See Comments)   Was told to not take this    Yellow Jacket Venom [bee Venom] Swelling, Other (See Comments)   Severe swelling where stung   Penicillins Hives   Did it involve swelling of the face/tongue/throat, SOB, or low BP? Unk Did it involve sudden or severe rash/hives, skin peeling, or any reaction on the inside of your mouth or nose? Yes Did you need to seek medical attention at a hospital or doctor's office? Unk When did it last happen?"Childhood- 55 years ago" If all above answers are "NO", may proceed with cephalosporin use.   Celexa [citalopram]    Contraindicated with A-Fib      Medication List    STOP taking these medications   lidocaine-prilocaine cream Commonly known as: EMLA   LORazepam 0.5 MG tablet Commonly known as: Ativan     TAKE these medications   acetaminophen 500 MG tablet Commonly known as: TYLENOL Take 500 mg by mouth every 6 (six) hours as needed for fever. What changed: Another medication with the same name was removed. Continue taking this medication, and follow the directions you see here.   cephALEXin 500 MG capsule Commonly known as: KEFLEX Take 2 capsules (1,000 mg total) by mouth every 8 (eight) hours for 8 days.   clonazePAM 1 MG disintegrating tablet Commonly known as: KLONOPIN Take 1 tablet (1 mg total) by mouth 2 (two) times daily as needed. What changed: reasons to take this   clopidogrel 75 MG  tablet Commonly known as: PLAVIX Take 1 tablet (75 mg total) by mouth daily.   diltiazem 180 MG 24 hr capsule Commonly known as: CARDIZEM CD TAKE 1 CAPSULE BY MOUTH EVERY DAY What changed: how much to take   ezetimibe 10 MG tablet Commonly known as: ZETIA Take 10 mg by mouth at bedtime.   fluticasone 50 MCG/ACT nasal spray Commonly known as: FLONASE Place 1-2 sprays into both nostrils as needed (for seasonal allergies).   gabapentin 100 MG capsule Commonly known as: NEURONTIN TAKE 1 CAPSULE BY MOUTH AT BEDTIME.   glimepiride 2 MG tablet Commonly known as: AMARYL Take 1 tablet (2 mg total) by mouth daily with breakfast.   glucose blood test strip Monitor BS before meals and at bedtime for a couple of weeks. Then may decrease to bid before meals   metoprolol succinate 100 MG 24 hr tablet Commonly known as: Toprol XL Take 1 tablet (100 mg total) by mouth in the morning and at bedtime.   nitroGLYCERIN 0.4 MG SL tablet Commonly known as: NITROSTAT Place 0.4 mg under the tongue every 5 (five) minutes as needed for chest pain.   ondansetron 8 MG tablet Commonly known as: Zofran Take 1 tablet (8 mg total) by mouth 2 (two) times daily as needed for refractory nausea / vomiting. Start on day 3 after chemotherapy.   onetouch ultrasoft lancets Use as instructed   prochlorperazine 10 MG tablet Commonly known as: COMPAZINE  Take 1 tablet (10 mg total) by mouth every 6 (six) hours as needed (Nausea or vomiting). What changed: reasons to take this   rosuvastatin 20 MG tablet Commonly known as: CRESTOR Take 1 tablet (20 mg total) by mouth daily. What changed: when to take this   sertraline 50 MG tablet Commonly known as: ZOLOFT Take 50 mg by mouth daily.   traZODone 50 MG tablet Commonly known as: DESYREL TAKE 1 TABLET (50 MG TOTAL) BY MOUTH AT BEDTIME AS NEEDED FOR SLEEP.       Discharge Assessment: Vitals:   12/19/19 0614 12/19/19 0919  BP: (!) 145/90 130/83   Pulse: 89 95  Resp: 16   Temp: 98.3 F (36.8 C)   SpO2: 97%    Skin clean, dry and intact without evidence of skin break down, no evidence of skin tears noted. IV catheter discontinued intact. Site without signs and symptoms of complications - no redness or edema noted at insertion site, patient denies c/o pain - only slight tenderness at site.  Dressing with slight pressure applied.  D/c Instructions-Education: Discharge instructions given to patient/family with verbalized understanding. D/c education completed with patient/family including follow up instructions, medication list, d/c activities limitations if indicated, with other d/c instructions as indicated by MD - patient able to verbalize understanding, all questions fully answered. Patient instructed to return to ED, call 911, or call MD for any changes in condition.  Patient escorted via [place], and D/C home via private auto.  Erasmo Leventhal, RN 12/19/2019 12:18 PM

## 2019-12-19 NOTE — Progress Notes (Signed)
Responded to consult; RN requesting DBIV. Pt states he is declining labs this morning due to "low hgb" and states he does not want to cause it to go lower by doing more labs. Explained what ordered labs are for; pt still wishes to decline. RN notified.

## 2019-12-21 ENCOUNTER — Encounter: Payer: Medicare HMO | Attending: Registered Nurse | Admitting: Physical Medicine and Rehabilitation

## 2019-12-21 ENCOUNTER — Ambulatory Visit: Payer: Medicare HMO | Admitting: Neurology

## 2019-12-21 DIAGNOSIS — I480 Paroxysmal atrial fibrillation: Secondary | ICD-10-CM | POA: Insufficient documentation

## 2019-12-21 DIAGNOSIS — I63531 Cerebral infarction due to unspecified occlusion or stenosis of right posterior cerebral artery: Secondary | ICD-10-CM | POA: Insufficient documentation

## 2019-12-21 DIAGNOSIS — I10 Essential (primary) hypertension: Secondary | ICD-10-CM | POA: Insufficient documentation

## 2019-12-22 ENCOUNTER — Inpatient Hospital Stay (HOSPITAL_BASED_OUTPATIENT_CLINIC_OR_DEPARTMENT_OTHER): Payer: Medicare HMO | Admitting: Hematology & Oncology

## 2019-12-22 ENCOUNTER — Inpatient Hospital Stay: Payer: Medicare HMO

## 2019-12-22 ENCOUNTER — Other Ambulatory Visit: Payer: Self-pay

## 2019-12-22 ENCOUNTER — Encounter: Payer: Self-pay | Admitting: *Deleted

## 2019-12-22 VITALS — BP 133/79 | HR 89 | Temp 98.3°F | Resp 17 | Wt 206.0 lb

## 2019-12-22 DIAGNOSIS — D500 Iron deficiency anemia secondary to blood loss (chronic): Secondary | ICD-10-CM

## 2019-12-22 DIAGNOSIS — D649 Anemia, unspecified: Secondary | ICD-10-CM | POA: Diagnosis not present

## 2019-12-22 DIAGNOSIS — Z5111 Encounter for antineoplastic chemotherapy: Secondary | ICD-10-CM | POA: Diagnosis not present

## 2019-12-22 DIAGNOSIS — C20 Malignant neoplasm of rectum: Secondary | ICD-10-CM

## 2019-12-22 DIAGNOSIS — Z452 Encounter for adjustment and management of vascular access device: Secondary | ICD-10-CM | POA: Diagnosis not present

## 2019-12-22 DIAGNOSIS — C775 Secondary and unspecified malignant neoplasm of intrapelvic lymph nodes: Secondary | ICD-10-CM

## 2019-12-22 DIAGNOSIS — I4891 Unspecified atrial fibrillation: Secondary | ICD-10-CM | POA: Diagnosis not present

## 2019-12-22 DIAGNOSIS — I639 Cerebral infarction, unspecified: Secondary | ICD-10-CM | POA: Diagnosis not present

## 2019-12-22 LAB — CBC WITH DIFFERENTIAL (CANCER CENTER ONLY)
Abs Immature Granulocytes: 0.52 10*3/uL — ABNORMAL HIGH (ref 0.00–0.07)
Basophils Absolute: 0.1 10*3/uL (ref 0.0–0.1)
Basophils Relative: 1 %
Eosinophils Absolute: 0.1 10*3/uL (ref 0.0–0.5)
Eosinophils Relative: 1 %
HCT: 28.9 % — ABNORMAL LOW (ref 39.0–52.0)
Hemoglobin: 9.6 g/dL — ABNORMAL LOW (ref 13.0–17.0)
Immature Granulocytes: 7 %
Lymphocytes Relative: 25 %
Lymphs Abs: 1.9 10*3/uL (ref 0.7–4.0)
MCH: 29.6 pg (ref 26.0–34.0)
MCHC: 33.2 g/dL (ref 30.0–36.0)
MCV: 89.2 fL (ref 80.0–100.0)
Monocytes Absolute: 0.9 10*3/uL (ref 0.1–1.0)
Monocytes Relative: 12 %
Neutro Abs: 4 10*3/uL (ref 1.7–7.7)
Neutrophils Relative %: 54 %
Platelet Count: 204 10*3/uL (ref 150–400)
RBC: 3.24 MIL/uL — ABNORMAL LOW (ref 4.22–5.81)
RDW: 19.7 % — ABNORMAL HIGH (ref 11.5–15.5)
WBC Count: 7.4 10*3/uL (ref 4.0–10.5)
nRBC: 0.4 % — ABNORMAL HIGH (ref 0.0–0.2)

## 2019-12-22 LAB — IRON AND TIBC
Iron: 73 ug/dL (ref 42–163)
Saturation Ratios: 26 % (ref 20–55)
TIBC: 276 ug/dL (ref 202–409)
UIBC: 204 ug/dL (ref 117–376)

## 2019-12-22 LAB — CMP (CANCER CENTER ONLY)
ALT: 53 U/L — ABNORMAL HIGH (ref 0–44)
AST: 30 U/L (ref 15–41)
Albumin: 3.5 g/dL (ref 3.5–5.0)
Alkaline Phosphatase: 70 U/L (ref 38–126)
Anion gap: 6 (ref 5–15)
BUN: 18 mg/dL (ref 8–23)
CO2: 29 mmol/L (ref 22–32)
Calcium: 9 mg/dL (ref 8.9–10.3)
Chloride: 105 mmol/L (ref 98–111)
Creatinine: 0.89 mg/dL (ref 0.61–1.24)
GFR, Est AFR Am: >60 mL/min (ref >60)
GFR, Estimated: >60 mL/min (ref >60)
Glucose, Bld: 136 mg/dL — ABNORMAL HIGH (ref 70–99)
Potassium: 3.1 mmol/L — ABNORMAL LOW (ref 3.5–5.1)
Sodium: 140 mmol/L (ref 135–145)
Total Bilirubin: 0.9 mg/dL (ref 0.3–1.2)
Total Protein: 6 g/dL — ABNORMAL LOW (ref 6.5–8.1)

## 2019-12-22 LAB — CEA (IN HOUSE-CHCC): CEA (CHCC-In House): 3.67 ng/mL (ref 0.00–5.00)

## 2019-12-22 LAB — FERRITIN: Ferritin: 678 ng/mL — ABNORMAL HIGH (ref 24–336)

## 2019-12-22 MED ORDER — OXALIPLATIN CHEMO INJECTION 100 MG/20ML
85.0000 mg/m2 | Freq: Once | INTRAVENOUS | Status: AC
  Administered 2019-12-22: 185 mg via INTRAVENOUS
  Filled 2019-12-22: qty 37

## 2019-12-22 MED ORDER — SODIUM CHLORIDE 0.9 % IV SOLN
10.0000 mg | Freq: Once | INTRAVENOUS | Status: AC
  Administered 2019-12-22: 10 mg via INTRAVENOUS
  Filled 2019-12-22: qty 10

## 2019-12-22 MED ORDER — FLUOROURACIL CHEMO INJECTION 2.5 GM/50ML
400.0000 mg/m2 | Freq: Once | INTRAVENOUS | Status: AC
  Administered 2019-12-22: 850 mg via INTRAVENOUS
  Filled 2019-12-22: qty 17

## 2019-12-22 MED ORDER — PALONOSETRON HCL INJECTION 0.25 MG/5ML
INTRAVENOUS | Status: AC
  Filled 2019-12-22: qty 5

## 2019-12-22 MED ORDER — SODIUM CHLORIDE 0.9 % IV SOLN
1920.0000 mg/m2 | INTRAVENOUS | Status: DC
  Administered 2019-12-22: 4150 mg via INTRAVENOUS
  Filled 2019-12-22: qty 83

## 2019-12-22 MED ORDER — DEXTROSE 5 % IV SOLN
Freq: Once | INTRAVENOUS | Status: AC
  Filled 2019-12-22: qty 250

## 2019-12-22 MED ORDER — SODIUM CHLORIDE 0.9 % IV SOLN
Freq: Once | INTRAVENOUS | Status: DC
  Filled 2019-12-22: qty 250

## 2019-12-22 MED ORDER — HEPARIN SOD (PORK) LOCK FLUSH 100 UNIT/ML IV SOLN
500.0000 [IU] | Freq: Once | INTRAVENOUS | Status: DC | PRN
  Filled 2019-12-22: qty 5

## 2019-12-22 MED ORDER — LEUCOVORIN CALCIUM INJECTION 350 MG
400.0000 mg/m2 | Freq: Once | INTRAVENOUS | Status: AC
  Administered 2019-12-22: 864 mg via INTRAVENOUS
  Filled 2019-12-22: qty 43.2

## 2019-12-22 MED ORDER — SODIUM CHLORIDE 0.9% FLUSH
10.0000 mL | INTRAVENOUS | Status: DC | PRN
  Filled 2019-12-22: qty 10

## 2019-12-22 MED ORDER — PALONOSETRON HCL INJECTION 0.25 MG/5ML
0.2500 mg | Freq: Once | INTRAVENOUS | Status: AC
  Administered 2019-12-22: 0.25 mg via INTRAVENOUS

## 2019-12-22 NOTE — Progress Notes (Signed)
Hematology and Oncology Follow Up Visit  Jeriko Kowalke [id number] 1955-09-03 64 y.o. 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 daily  Aspirin 81 mg p.o. daily  FOLFOX --  S/p cycle #2 -- started on 11/17/2019     Interim History:  Mr. Safley is in for follow-up.  He actually was hospitalized last week.  He had E. coli in his blood.  He was not neutropenic.  So not sure how he got E. coli in the blood.  He was not having any diarrhea.  He thinks that it may have been from having a salad.  He has recovered nicely.  He is on Keflex right now.  I think he bindings Keflex for couple more days.  He has had no problems with nausea or vomiting.  He did have a dose of IV iron in the hospital.  He had a CT scan done in the hospital.  The CT scan did not show any abscess.  There is no diverticulitis.  Thankfully, his tumors had not responded.  They are shrinking.  His CEA level went down from 12.2 to 6.7.  He has had no bleeding.  There is been no diarrhea.  He has had no headache.  He has had no rashes.  He has had no leg swelling.  Overall, his performance status is ECOG 1.    Medications:  Current Outpatient Medications:  .  acetaminophen (TYLENOL) 500 MG tablet, Take 500 mg by mouth every 6 (six) hours as needed for fever., Disp: , Rfl:  .  cephALEXin (KEFLEX) 500 MG capsule, Take 2 capsules (1,000 mg total) by mouth every 8 (eight) hours for 8 days., Disp: 48 capsule, Rfl: 0 .  clonazePAM (KLONOPIN) 1 MG disintegrating tablet, Take 1 tablet (1 mg total) by mouth 2 (two) times daily as needed. (Patient taking differently: Take 1 mg by mouth 2 (two) times daily as needed (for anxiety). ), Disp: 30 tablet, Rfl: 0 .  clopidogrel (PLAVIX) 75 MG tablet, Take 1 tablet (75 mg total) by mouth daily., Disp: 30 tablet,  Rfl: 0 .  diltiazem (CARDIZEM CD) 180 MG 24 hr capsule, TAKE 1 CAPSULE BY MOUTH EVERY DAY (Patient taking differently: Take 180 mg by mouth daily. ), Disp: 30 capsule, Rfl: 1 .  ezetimibe (ZETIA) 10 MG tablet, Take 10 mg by mouth at bedtime., Disp: , Rfl:  .  fluticasone (FLONASE) 50 MCG/ACT nasal spray, Place 1-2 sprays into both nostrils as needed (for seasonal allergies). , Disp: , Rfl:  .  gabapentin (NEURONTIN) 100 MG capsule, TAKE 1 CAPSULE BY MOUTH AT BEDTIME. (Patient taking differently: Take 100 mg by mouth at bedtime. ), Disp: 30 capsule, Rfl: 3 .  glimepiride (AMARYL) 2 MG tablet, Take 1 tablet (2 mg total) by mouth daily with breakfast., Disp: 30 tablet, Rfl: 0 .  glucose blood test strip, Monitor BS before meals and at bedtime for a couple of weeks. Then may decrease to bid before meals, Disp: 100 each, Rfl: 12 .  Lancets (ONETOUCH ULTRASOFT) lancets, Use as instructed, Disp: 100 each, Rfl: 12 .  metoprolol succinate (TOPROL XL) 100 MG 24 hr tablet, Take 1 tablet (100 mg total) by mouth in the morning and at bedtime., Disp: 60 tablet, Rfl: 3 .  nitroGLYCERIN (NITROSTAT) 0.4 MG SL tablet, Place 0.4 mg under the  tongue every 5 (five) minutes as needed for chest pain. , Disp: , Rfl:  .  ondansetron (ZOFRAN) 8 MG tablet, Take 1 tablet (8 mg total) by mouth 2 (two) times daily as needed for refractory nausea / vomiting. Start on day 3 after chemotherapy., Disp: 30 tablet, Rfl: 1 .  prochlorperazine (COMPAZINE) 10 MG tablet, Take 1 tablet (10 mg total) by mouth every 6 (six) hours as needed (Nausea or vomiting). (Patient taking differently: Take 10 mg by mouth every 6 (six) hours as needed for nausea or vomiting. ), Disp: 30 tablet, Rfl: 1 .  rosuvastatin (CRESTOR) 20 MG tablet, Take 1 tablet (20 mg total) by mouth daily. (Patient taking differently: Take 20 mg by mouth at bedtime. ), Disp: 30 tablet, Rfl: 0 .  sertraline (ZOLOFT) 50 MG tablet, Take 50 mg by mouth daily., Disp: , Rfl:  .   traZODone (DESYREL) 50 MG tablet, TAKE 1 TABLET (50 MG TOTAL) BY MOUTH AT BEDTIME AS NEEDED FOR SLEEP., Disp: 30 tablet, Rfl: 3  Allergies:  Allergies  Allergen Reactions  . Ibuprofen Other (See Comments)    Was told to not take this   . Metformin And Related Other (See Comments)    Bloody stools   . Naproxen Other (See Comments)    Was told to not take this   . Yellow Jacket Venom [Bee Venom] Swelling and Other (See Comments)    Severe swelling where stung  . Penicillins Hives    Did it involve swelling of the face/tongue/throat, SOB, or low BP? Unk Did it involve sudden or severe rash/hives, skin peeling, or any reaction on the inside of your mouth or nose? Yes Did you need to seek medical attention at a hospital or doctor's office? Unk When did it last happen?"Childhood- 55 years ago" If all above answers are "NO", may proceed with cephalosporin use.   . Celexa [Citalopram]     Contraindicated with A-Fib    Past Medical History, Surgical history, Social history, and Family History were reviewed and updated.  Review of Systems: Review of Systems  Constitutional: Negative.   HENT:  Negative.   Eyes: Negative.   Respiratory: Negative.   Cardiovascular: Positive for palpitations.  Gastrointestinal: Positive for blood in stool.  Endocrine: Negative.   Genitourinary: Negative.    Skin: Negative.   Neurological: Positive for extremity weakness.  Hematological: Negative.   Psychiatric/Behavioral: Negative.     Physical Exam:  weight is 206 lb (93.4 kg). His oral temperature is 98.3 F (36.8 C). His blood pressure is 133/79 and his pulse is 89. His respiration is 17 and oxygen saturation is 100%.   Wt Readings from Last 3 Encounters:  12/22/19 206 lb (93.4 kg)  12/16/19 205 lb 0.4 oz (93 kg)  12/09/19 205 lb (93 kg)    Physical Exam Vitals reviewed.  HENT:     Head: Normocephalic and atraumatic.  Eyes:     Pupils: Pupils are equal, round, and reactive to light.    Cardiovascular:     Rate and Rhythm: Normal rate and regular rhythm.     Heart sounds: Normal heart sounds.     Comments: Cardiac exam shows a tachycardic rate.  The rate is somewhat regular.  There may be occasional extra beat.  There are no murmurs, rubs or bruits. Pulmonary:     Effort: Pulmonary effort is normal.     Breath sounds: Normal breath sounds.  Abdominal:     General: Bowel sounds are normal.  Palpations: Abdomen is soft.     Comments: Abdominal exam is soft.  He has decent bowel sounds.  There is no guarding or rebound tenderness.  There is no fluid wave.  There is no palpable liver or spleen tip.  Musculoskeletal:        General: No tenderness or deformity. Normal range of motion.     Cervical back: Normal range of motion.  Lymphadenopathy:     Cervical: No cervical adenopathy.  Skin:    General: Skin is warm and dry.     Findings: No erythema or rash.  Neurological:     Mental Status: He is alert and oriented to person, place, and time.  Psychiatric:        Behavior: Behavior normal.        Thought Content: Thought content normal.        Judgment: Judgment normal.      Lab Results  Component Value Date   WBC 7.4 12/22/2019   HGB 9.6 (L) 12/22/2019   HCT 28.9 (L) 12/22/2019   MCV 89.2 12/22/2019   PLT 204 12/22/2019     Chemistry      Component Value Date/Time   NA 140 12/22/2019 0920   NA 140 08/28/2019 0940   K 3.1 (L) 12/22/2019 0920   CL 105 12/22/2019 0920   CO2 29 12/22/2019 0920   BUN 18 12/22/2019 0920   BUN 21 08/28/2019 0940   CREATININE 0.89 12/22/2019 0920      Component Value Date/Time   CALCIUM 9.0 12/22/2019 0920   ALKPHOS 70 12/22/2019 0920   AST 30 12/22/2019 0920   ALT 53 (H) 12/22/2019 0920   BILITOT 0.9 12/22/2019 0920      Impression and Plan: Mr. Shuler is a very nice 65 year old white male.  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  He had a rectal bleeding.  He subsequently was found to  have rectal cancer.  He had adenopathy that was distant to the rectum.  We will proceed with his third cycle of chemotherapy.  I would probably do 3 cycles of treatment and then repeat a CT scan after his fifth cycle of treatment.  We will have to see how his anemia is doing.  We may need to give him another dose of IV iron.  I will plan to get him back in 2 more weeks.  Volanda Napoleon, MD 8/24/202110:06 AM

## 2019-12-22 NOTE — Patient Instructions (Signed)
[facility] Discharge Instructions for Patients Receiving Chemotherapy  Today you received the following chemotherapy agents 5FU. Oxaliplatin  To help prevent nausea and vomiting after your treatment, we encourage you to take your nausea medication    If you develop nausea and vomiting that is not controlled by your nausea medication, call the clinic.   BELOW ARE SYMPTOMS THAT SHOULD BE REPORTED IMMEDIATELY:  *FEVER GREATER THAN 100.5 F  *CHILLS WITH OR WITHOUT FEVER  NAUSEA AND VOMITING THAT IS NOT CONTROLLED WITH YOUR NAUSEA MEDICATION  *UNUSUAL SHORTNESS OF BREATH  *UNUSUAL BRUISING OR BLEEDING  TENDERNESS IN MOUTH AND THROAT WITH OR WITHOUT PRESENCE OF ULCERS  *URINARY PROBLEMS  *BOWEL PROBLEMS  UNUSUAL RASH Items with * indicate a potential emergency and should be followed up as soon as possible.  Feel free to call the clinic should you have any questions or concerns. The clinic phone number is [phone].  Please show the [facility] at check-in to the Emergency Department and triage nurse.

## 2019-12-22 NOTE — Patient Instructions (Signed)
Implanted Port Insertion, Care After °This sheet gives you information about how to care for yourself after your procedure. Your health care provider may also give you more specific instructions. If you have problems or questions, contact your health care provider. °What can I expect after the procedure? °After the procedure, it is common to have: °· Discomfort at the port insertion site. °· Bruising on the skin over the port. This should improve over 3-4 days. °Follow these instructions at home: °Port care °· After your port is placed, you will get a manufacturer's information card. The card has information about your port. Keep this card with you at all times. °· Take care of the port as told by your health care provider. Ask your health care provider if you or a family member can get training for taking care of the port at home. A home health care nurse may also take care of the port. °· Make sure to remember what type of port you have. °Incision care ° °  ° °· Follow instructions from your health care provider about how to take care of your port insertion site. Make sure you: °? Wash your hands with soap and water before and after you change your bandage (dressing). If soap and water are not available, use hand sanitizer. °? Change your dressing as told by your health care provider. °? Leave stitches (sutures), skin glue, or adhesive strips in place. These skin closures may need to stay in place for 2 weeks or longer. If adhesive strip edges start to loosen and curl up, you may trim the loose edges. Do not remove adhesive strips completely unless your health care provider tells you to do that. °· Check your port insertion site every day for signs of infection. Check for: °? Redness, swelling, or pain. °? Fluid or blood. °? Warmth. °? Pus or a bad smell. °Activity °· Return to your normal activities as told by your health care provider. Ask your health care provider what activities are safe for you. °· Do not  lift anything that is heavier than 10 lb (4.5 kg), or the limit that you are told, until your health care provider says that it is safe. °General instructions °· Take over-the-counter and prescription medicines only as told by your health care provider. °· Do not take baths, swim, or use a hot tub until your health care provider approves. Ask your health care provider if you may take showers. You may only be allowed to take sponge baths. °· Do not drive for 24 hours if you were given a sedative during your procedure. °· Wear a medical alert bracelet in case of an emergency. This will tell any health care providers that you have a port. °· Keep all follow-up visits as told by your health care provider. This is important. °Contact a health care provider if: °· You cannot flush your port with saline as directed, or you cannot draw blood from the port. °· You have a fever or chills. °· You have redness, swelling, or pain around your port insertion site. °· You have fluid or blood coming from your port insertion site. °· Your port insertion site feels warm to the touch. °· You have pus or a bad smell coming from the port insertion site. °Get help right away if: °· You have chest pain or shortness of breath. °· You have bleeding from your port that you cannot control. °Summary °· Take care of the port as told by your health   care provider. Keep the manufacturer's information card with you at all times. °· Change your dressing as told by your health care provider. °· Contact a health care provider if you have a fever or chills or if you have redness, swelling, or pain around your port insertion site. °· Keep all follow-up visits as told by your health care provider. °This information is not intended to replace advice given to you by your health care provider. Make sure you discuss any questions you have with your health care provider. °Document Revised: 11/12/2017 Document Reviewed: 11/12/2017 °Elsevier Patient Education ©  2020 [employer] ° °

## 2019-12-22 NOTE — Progress Notes (Signed)
Oncology Nurse Navigator Documentation  Oncology Nurse Navigator Flowsheets 12/22/2019  Abnormal Finding Date -  Confirmed Diagnosis Date -  Planned Course of Treatment -  Phase of Treatment -  Chemotherapy Actual Start Date: -  Navigator Follow Up Date: 01/05/2020  Navigator Follow Up Reason: Follow-up Appointment;Chemotherapy  Navigator Restaurant manager, fast food Encounter Type Treatment  Treatment Initiated Date -  Patient Visit Type MedOnc  Treatment Phase Active Tx  Barriers/Navigation Needs Coordination of Care;Education  Education -  Interventions Psycho-Social Support  Acuity Level 2-Minimal Needs (1-2 Barriers Identified)  Referrals -  Coordination of Care -  Education Method -  Support Groups/Services Friends and Family  Time Spent with Patient 30

## 2019-12-23 ENCOUNTER — Ambulatory Visit: Payer: Medicare HMO

## 2019-12-23 ENCOUNTER — Ambulatory Visit: Payer: Medicare HMO | Admitting: Occupational Therapy

## 2019-12-24 ENCOUNTER — Other Ambulatory Visit: Payer: Self-pay

## 2019-12-24 ENCOUNTER — Inpatient Hospital Stay: Payer: Medicare HMO

## 2019-12-24 VITALS — BP 129/77 | HR 77 | Temp 98.4°F | Resp 18

## 2019-12-24 DIAGNOSIS — C775 Secondary and unspecified malignant neoplasm of intrapelvic lymph nodes: Secondary | ICD-10-CM

## 2019-12-24 DIAGNOSIS — Z5111 Encounter for antineoplastic chemotherapy: Secondary | ICD-10-CM | POA: Diagnosis not present

## 2019-12-24 DIAGNOSIS — C20 Malignant neoplasm of rectum: Secondary | ICD-10-CM

## 2019-12-24 MED ORDER — HEPARIN SOD (PORK) LOCK FLUSH 100 UNIT/ML IV SOLN
500.0000 [IU] | Freq: Once | INTRAVENOUS | Status: AC | PRN
  Administered 2019-12-24: 500 [IU]
  Filled 2019-12-24: qty 5

## 2019-12-24 MED ORDER — SODIUM CHLORIDE 0.9% FLUSH
10.0000 mL | INTRAVENOUS | Status: DC | PRN
  Administered 2019-12-24: 10 mL
  Filled 2019-12-24: qty 10

## 2019-12-28 ENCOUNTER — Ambulatory Visit: Payer: Medicare HMO | Admitting: Occupational Therapy

## 2019-12-28 ENCOUNTER — Ambulatory Visit: Payer: Medicare HMO

## 2019-12-30 ENCOUNTER — Ambulatory Visit: Payer: Medicare HMO

## 2019-12-30 ENCOUNTER — Ambulatory Visit: Payer: Medicare HMO | Admitting: Occupational Therapy

## 2019-12-30 ENCOUNTER — Encounter: Payer: Medicare HMO | Admitting: Occupational Therapy

## 2020-01-05 ENCOUNTER — Inpatient Hospital Stay (HOSPITAL_BASED_OUTPATIENT_CLINIC_OR_DEPARTMENT_OTHER): Payer: Medicare HMO | Admitting: Hematology & Oncology

## 2020-01-05 ENCOUNTER — Inpatient Hospital Stay: Payer: Medicare HMO | Attending: Hematology & Oncology

## 2020-01-05 ENCOUNTER — Encounter: Payer: Self-pay | Admitting: *Deleted

## 2020-01-05 ENCOUNTER — Encounter: Payer: Self-pay | Admitting: Hematology & Oncology

## 2020-01-05 ENCOUNTER — Inpatient Hospital Stay: Payer: Medicare HMO

## 2020-01-05 ENCOUNTER — Ambulatory Visit: Payer: Medicare HMO | Admitting: Neurology

## 2020-01-05 ENCOUNTER — Other Ambulatory Visit: Payer: Self-pay

## 2020-01-05 VITALS — BP 121/99 | HR 73 | Temp 98.2°F | Resp 20 | Wt 209.0 lb

## 2020-01-05 DIAGNOSIS — C775 Secondary and unspecified malignant neoplasm of intrapelvic lymph nodes: Secondary | ICD-10-CM

## 2020-01-05 DIAGNOSIS — Z452 Encounter for adjustment and management of vascular access device: Secondary | ICD-10-CM | POA: Insufficient documentation

## 2020-01-05 DIAGNOSIS — I4891 Unspecified atrial fibrillation: Secondary | ICD-10-CM | POA: Diagnosis not present

## 2020-01-05 DIAGNOSIS — D649 Anemia, unspecified: Secondary | ICD-10-CM | POA: Insufficient documentation

## 2020-01-05 DIAGNOSIS — C20 Malignant neoplasm of rectum: Secondary | ICD-10-CM | POA: Diagnosis not present

## 2020-01-05 DIAGNOSIS — D500 Iron deficiency anemia secondary to blood loss (chronic): Secondary | ICD-10-CM

## 2020-01-05 DIAGNOSIS — Z5111 Encounter for antineoplastic chemotherapy: Secondary | ICD-10-CM | POA: Insufficient documentation

## 2020-01-05 LAB — CBC WITH DIFFERENTIAL (CANCER CENTER ONLY)
Abs Immature Granulocytes: 0.02 10*3/uL (ref 0.00–0.07)
Basophils Absolute: 0.1 10*3/uL (ref 0.0–0.1)
Basophils Relative: 1 %
Eosinophils Absolute: 0.1 10*3/uL (ref 0.0–0.5)
Eosinophils Relative: 2 %
HCT: 32.5 % — ABNORMAL LOW (ref 39.0–52.0)
Hemoglobin: 10.6 g/dL — ABNORMAL LOW (ref 13.0–17.0)
Immature Granulocytes: 0 %
Lymphocytes Relative: 24 %
Lymphs Abs: 1.5 10*3/uL (ref 0.7–4.0)
MCH: 30 pg (ref 26.0–34.0)
MCHC: 32.6 g/dL (ref 30.0–36.0)
MCV: 92.1 fL (ref 80.0–100.0)
Monocytes Absolute: 0.7 10*3/uL (ref 0.1–1.0)
Monocytes Relative: 10 %
Neutro Abs: 4 10*3/uL (ref 1.7–7.7)
Neutrophils Relative %: 63 %
Platelet Count: 133 10*3/uL — ABNORMAL LOW (ref 150–400)
RBC: 3.53 MIL/uL — ABNORMAL LOW (ref 4.22–5.81)
RDW: 21.4 % — ABNORMAL HIGH (ref 11.5–15.5)
WBC Count: 6.3 10*3/uL (ref 4.0–10.5)
nRBC: 0 % (ref 0.0–0.2)

## 2020-01-05 LAB — IRON AND TIBC
Iron: 93 ug/dL (ref 42–163)
Saturation Ratios: 25 % (ref 20–55)
TIBC: 379 ug/dL (ref 202–409)
UIBC: 286 ug/dL (ref 117–376)

## 2020-01-05 LAB — CMP (CANCER CENTER ONLY)
ALT: 13 U/L (ref 0–44)
AST: 12 U/L — ABNORMAL LOW (ref 15–41)
Albumin: 3.9 g/dL (ref 3.5–5.0)
Alkaline Phosphatase: 59 U/L (ref 38–126)
Anion gap: 7 (ref 5–15)
BUN: 30 mg/dL — ABNORMAL HIGH (ref 8–23)
CO2: 27 mmol/L (ref 22–32)
Calcium: 9.3 mg/dL (ref 8.9–10.3)
Chloride: 107 mmol/L (ref 98–111)
Creatinine: 1.02 mg/dL (ref 0.61–1.24)
GFR, Est AFR Am: >60 mL/min (ref >60)
GFR, Estimated: >60 mL/min (ref >60)
Glucose, Bld: 125 mg/dL — ABNORMAL HIGH (ref 70–99)
Potassium: 3.8 mmol/L (ref 3.5–5.1)
Sodium: 141 mmol/L (ref 135–145)
Total Bilirubin: 1.1 mg/dL (ref 0.3–1.2)
Total Protein: 6.2 g/dL — ABNORMAL LOW (ref 6.5–8.1)

## 2020-01-05 LAB — CEA (IN HOUSE-CHCC): CEA (CHCC-In House): 3.63 ng/mL (ref 0.00–5.00)

## 2020-01-05 LAB — FERRITIN: Ferritin: 318 ng/mL (ref 24–336)

## 2020-01-05 MED ORDER — PALONOSETRON HCL INJECTION 0.25 MG/5ML
INTRAVENOUS | Status: AC
  Filled 2020-01-05: qty 5

## 2020-01-05 MED ORDER — DEXTROSE 5 % IV SOLN
Freq: Once | INTRAVENOUS | Status: AC
  Filled 2020-01-05: qty 250

## 2020-01-05 MED ORDER — LEUCOVORIN CALCIUM INJECTION 350 MG
400.0000 mg/m2 | Freq: Once | INTRAVENOUS | Status: AC
  Administered 2020-01-05: 864 mg via INTRAVENOUS
  Filled 2020-01-05: qty 43.2

## 2020-01-05 MED ORDER — SODIUM CHLORIDE 0.9 % IV SOLN
Freq: Once | INTRAVENOUS | Status: DC
  Filled 2020-01-05: qty 250

## 2020-01-05 MED ORDER — SODIUM CHLORIDE 0.9 % IV SOLN
10.0000 mg | Freq: Once | INTRAVENOUS | Status: AC
  Administered 2020-01-05: 10 mg via INTRAVENOUS
  Filled 2020-01-05: qty 10

## 2020-01-05 MED ORDER — PALONOSETRON HCL INJECTION 0.25 MG/5ML
0.2500 mg | Freq: Once | INTRAVENOUS | Status: AC
  Administered 2020-01-05: 0.25 mg via INTRAVENOUS

## 2020-01-05 MED ORDER — OXALIPLATIN CHEMO INJECTION 100 MG/20ML
85.0000 mg/m2 | Freq: Once | INTRAVENOUS | Status: AC
  Administered 2020-01-05: 185 mg via INTRAVENOUS
  Filled 2020-01-05: qty 37

## 2020-01-05 MED ORDER — SODIUM CHLORIDE 0.9 % IV SOLN
1920.0000 mg/m2 | INTRAVENOUS | Status: DC
  Administered 2020-01-05: 4150 mg via INTRAVENOUS
  Filled 2020-01-05: qty 83

## 2020-01-05 MED ORDER — FLUOROURACIL CHEMO INJECTION 2.5 GM/50ML
400.0000 mg/m2 | Freq: Once | INTRAVENOUS | Status: AC
  Administered 2020-01-05: 850 mg via INTRAVENOUS
  Filled 2020-01-05: qty 17

## 2020-01-05 NOTE — Progress Notes (Signed)
Hematology and Oncology Follow Up Visit  Javonne Arntson [id number] 07/09/1955 64 y.o. 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 daily  Aspirin 81 mg p.o. daily  FOLFOX --  S/p cycle #3-- started on 11/17/2019     Interim History:  Mr. Swift is in for follow-up.  He really looks great.  Had a very nice Labor Day weekend.  He went hunting with some of his friends but he did not actually go hunting himself.    Of note, his last CEA level is down to normal 3.67.  He has had no problems with nausea or vomiting.  Is been no change in bowel or bladder habits.  He has had no issues with Plavix.  There has been no neurological issues.  His blood sugars seem to be under very good control.  He has had no problem with fever.  There has been no problems with cough or shortness of breath.  He has had no mouth sores.  There has been no rashes.  His weakness in the left leg seems to be getting better.  Overall, his performance status is ECOG 1.    Medications:  Current Outpatient Medications:  .  clopidogrel (PLAVIX) 75 MG tablet, Take 1 tablet (75 mg total) by mouth daily., Disp: 30 tablet, Rfl: 0 .  diltiazem (CARDIZEM CD) 180 MG 24 hr capsule, TAKE 1 CAPSULE BY MOUTH EVERY DAY (Patient taking differently: Take 180 mg by mouth daily. ), Disp: 30 capsule, Rfl: 1 .  ezetimibe (ZETIA) 10 MG tablet, Take 10 mg by mouth at bedtime., Disp: , Rfl:  .  gabapentin (NEURONTIN) 100 MG capsule, TAKE 1 CAPSULE BY MOUTH AT BEDTIME. (Patient taking differently: Take 100 mg by mouth at bedtime. ), Disp: 30 capsule, Rfl: 3 .  glimepiride (AMARYL) 2 MG tablet, Take 1 tablet (2 mg total) by mouth daily with breakfast., Disp: 30 tablet, Rfl: 0 .  glucose blood test strip, Monitor BS before meals and at bedtime for a couple of weeks. Then  may decrease to bid before meals, Disp: 100 each, Rfl: 12 .  Lancets (ONETOUCH ULTRASOFT) lancets, Use as instructed, Disp: 100 each, Rfl: 12 .  metoprolol succinate (TOPROL XL) 100 MG 24 hr tablet, Take 1 tablet (100 mg total) by mouth in the morning and at bedtime., Disp: 60 tablet, Rfl: 3 .  rosuvastatin (CRESTOR) 20 MG tablet, Take 1 tablet (20 mg total) by mouth daily. (Patient taking differently: Take 20 mg by mouth at bedtime. ), Disp: 30 tablet, Rfl: 0 .  sertraline (ZOLOFT) 50 MG tablet, Take 50 mg by mouth daily., Disp: , Rfl:  .  acetaminophen (TYLENOL) 500 MG tablet, Take 500 mg by mouth every 6 (six) hours as needed for fever. (Patient not taking: Reported on 01/05/2020), Disp: , Rfl:  .  clonazePAM (KLONOPIN) 1 MG disintegrating tablet, Take 1 tablet (1 mg total) by mouth 2 (two) times daily as needed. (Patient not taking: Reported on 01/05/2020), Disp: 30 tablet, Rfl: 0 .  fluticasone (FLONASE) 50 MCG/ACT nasal spray, Place 1-2 sprays into both nostrils as needed (for seasonal allergies).  (Patient not taking: Reported on 01/05/2020), Disp: , Rfl:  .  nitroGLYCERIN (NITROSTAT) 0.4 MG SL tablet, Place 0.4 mg under the tongue every 5 (five) minutes as needed for chest pain.  (Patient not taking:   Reported on 01/05/2020), Disp: , Rfl:  .  ondansetron (ZOFRAN) 8 MG tablet, Take 1 tablet (8 mg total) by mouth 2 (two) times daily as needed for refractory nausea / vomiting. Start on day 3 after chemotherapy. (Patient not taking: Reported on 01/05/2020), Disp: 30 tablet, Rfl: 1 .  prochlorperazine (COMPAZINE) 10 MG tablet, Take 1 tablet (10 mg total) by mouth every 6 (six) hours as needed (Nausea or vomiting). (Patient not taking: Reported on 01/05/2020), Disp: 30 tablet, Rfl: 1 .  traZODone (DESYREL) 50 MG tablet, TAKE 1 TABLET (50 MG TOTAL) BY MOUTH AT BEDTIME AS NEEDED FOR SLEEP. (Patient not taking: Reported on 01/05/2020), Disp: 30 tablet, Rfl: 3  Allergies:  Allergies  Allergen Reactions  . Ibuprofen  Other (See Comments)    Was told to not take this   . Metformin And Related Other (See Comments)    Bloody stools   . Naproxen Other (See Comments)    Was told to not take this   . Yellow Jacket Venom [Bee Venom] Swelling and Other (See Comments)    Severe swelling where stung  . Penicillins Hives    Did it involve swelling of the face/tongue/throat, SOB, or low BP? Unk Did it involve sudden or severe rash/hives, skin peeling, or any reaction on the inside of your mouth or nose? Yes Did you need to seek medical attention at a hospital or doctor's office? Unk When did it last happen?"Childhood- 55 years ago" If all above answers are "NO", may proceed with cephalosporin use.   . Celexa [Citalopram]     Contraindicated with A-Fib    Past Medical History, Surgical history, Social history, and Family History were reviewed and updated.  Review of Systems: Review of Systems  Constitutional: Negative.   HENT:  Negative.   Eyes: Negative.   Respiratory: Negative.   Cardiovascular: Positive for palpitations.  Gastrointestinal: Positive for blood in stool.  Endocrine: Negative.   Genitourinary: Negative.    Skin: Negative.   Neurological: Positive for extremity weakness.  Hematological: Negative.   Psychiatric/Behavioral: Negative.     Physical Exam:  weight is 209 lb (94.8 kg). His oral temperature is 98.2 F (36.8 C). His blood pressure is 121/99 (abnormal) and his pulse is 73. His respiration is 20 and oxygen saturation is 100%.   Wt Readings from Last 3 Encounters:  01/05/20 209 lb (94.8 kg)  01/05/20 209 lb (94.8 kg)  12/22/19 206 lb (93.4 kg)    Physical Exam Vitals reviewed.  HENT:     Head: Normocephalic and atraumatic.  Eyes:     Pupils: Pupils are equal, round, and reactive to light.  Cardiovascular:     Rate and Rhythm: Normal rate and regular rhythm.     Heart sounds: Normal heart sounds.     Comments: Cardiac exam shows a tachycardic rate.  The rate is  somewhat regular.  There may be occasional extra beat.  There are no murmurs, rubs or bruits. Pulmonary:     Effort: Pulmonary effort is normal.     Breath sounds: Normal breath sounds.  Abdominal:     General: Bowel sounds are normal.     Palpations: Abdomen is soft.     Comments: Abdominal exam is soft.  He has decent bowel sounds.  There is no guarding or rebound tenderness.  There is no fluid wave.  There is no palpable liver or spleen tip.  Musculoskeletal:        General: No tenderness or deformity. Normal   range of motion.     Cervical back: Normal range of motion.  Lymphadenopathy:     Cervical: No cervical adenopathy.  Skin:    General: Skin is warm and dry.     Findings: No erythema or rash.  Neurological:     Mental Status: He is alert and oriented to person, place, and time.  Psychiatric:        Behavior: Behavior normal.        Thought Content: Thought content normal.        Judgment: Judgment normal.      Lab Results  Component Value Date   WBC 6.3 01/05/2020   HGB 10.6 (L) 01/05/2020   HCT 32.5 (L) 01/05/2020   MCV 92.1 01/05/2020   PLT 133 (L) 01/05/2020     Chemistry      Component Value Date/Time   NA 141 01/05/2020 0831   NA 140 08/28/2019 0940   K 3.8 01/05/2020 0831   CL 107 01/05/2020 0831   CO2 27 01/05/2020 0831   BUN 30 (H) 01/05/2020 0831   BUN 21 08/28/2019 0940   CREATININE 1.02 01/05/2020 0831      Component Value Date/Time   CALCIUM 9.3 01/05/2020 0831   ALKPHOS 59 01/05/2020 0831   AST 12 (L) 01/05/2020 0831   ALT 13 01/05/2020 0831   BILITOT 1.1 01/05/2020 0831      Impression and Plan: Mr. Siefring is a very nice 63-year-old white male.  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  He had a rectal bleeding.  He subsequently was found to have rectal cancer.  He had adenopathy that was distant to the rectum.  We will proceed with his fourth cycle of chemotherapy.  I would probably do 2 additional cycles of  treatment and then repeat a CT scan after his 6th cycle of treatment.  The fact that his CEA is back to normal is certainly encouraging.  His anemia is better.  He says he is eating a lot of beef.  I will plan to get him back in 2 more weeks.  Peter R Ennever, MD 9/7/20219:00 AM 

## 2020-01-05 NOTE — Patient Instructions (Signed)
[facility] Discharge Instructions for Patients Receiving Chemotherapy  Today you received the following chemotherapy agents 5FU. Oxaliplatin  To help prevent nausea and vomiting after your treatment, we encourage you to take your nausea medication    If you develop nausea and vomiting that is not controlled by your nausea medication, call the clinic.   BELOW ARE SYMPTOMS THAT SHOULD BE REPORTED IMMEDIATELY:  *FEVER GREATER THAN 100.5 F  *CHILLS WITH OR WITHOUT FEVER  NAUSEA AND VOMITING THAT IS NOT CONTROLLED WITH YOUR NAUSEA MEDICATION  *UNUSUAL SHORTNESS OF BREATH  *UNUSUAL BRUISING OR BLEEDING  TENDERNESS IN MOUTH AND THROAT WITH OR WITHOUT PRESENCE OF ULCERS  *URINARY PROBLEMS  *BOWEL PROBLEMS  UNUSUAL RASH Items with * indicate a potential emergency and should be followed up as soon as possible.  Feel free to call the clinic should you have any questions or concerns. The clinic phone number is [phone].  Please show the [facility] at check-in to the Emergency Department and triage nurse.

## 2020-01-05 NOTE — Progress Notes (Signed)
Oncology Nurse Navigator Documentation  Oncology Nurse Navigator Flowsheets 01/05/2020  Abnormal Finding Date -  Confirmed Diagnosis Date -  Planned Course of Treatment -  Phase of Treatment -  Chemotherapy Actual Start Date: -  Navigator Follow Up Date: 01/19/2020  Navigator Follow Up Reason: Follow-up Appointment;Chemotherapy  Navigator Restaurant manager, fast food Encounter Type Treatment  Treatment Initiated Date -  Patient Visit Type MedOnc  Treatment Phase Active Tx  Barriers/Navigation Needs Coordination of Care;Education  Education -  Interventions Psycho-Social Support  Acuity Level 2-Minimal Needs (1-2 Barriers Identified)  Referrals -  Coordination of Care -  Education Method -  Support Groups/Services Friends and Family  Time Spent with Patient 15

## 2020-01-05 NOTE — Progress Notes (Signed)
MD reviewed CBC and CMET, VO ok to treat despite labs

## 2020-01-06 ENCOUNTER — Ambulatory Visit: Payer: Medicare HMO | Admitting: Internal Medicine

## 2020-01-06 ENCOUNTER — Other Ambulatory Visit: Payer: Self-pay | Admitting: Internal Medicine

## 2020-01-06 ENCOUNTER — Encounter: Payer: Medicare HMO | Admitting: Occupational Therapy

## 2020-01-06 ENCOUNTER — Ambulatory Visit: Payer: Medicare HMO

## 2020-01-06 DIAGNOSIS — I4892 Unspecified atrial flutter: Secondary | ICD-10-CM

## 2020-01-06 DIAGNOSIS — I4891 Unspecified atrial fibrillation: Secondary | ICD-10-CM

## 2020-01-07 ENCOUNTER — Other Ambulatory Visit: Payer: Self-pay

## 2020-01-07 ENCOUNTER — Inpatient Hospital Stay: Payer: Medicare HMO

## 2020-01-07 VITALS — BP 109/74 | HR 83 | Temp 98.4°F

## 2020-01-07 DIAGNOSIS — Z5111 Encounter for antineoplastic chemotherapy: Secondary | ICD-10-CM | POA: Diagnosis not present

## 2020-01-07 DIAGNOSIS — C775 Secondary and unspecified malignant neoplasm of intrapelvic lymph nodes: Secondary | ICD-10-CM

## 2020-01-07 MED ORDER — HEPARIN SOD (PORK) LOCK FLUSH 100 UNIT/ML IV SOLN
500.0000 [IU] | Freq: Once | INTRAVENOUS | Status: AC | PRN
  Administered 2020-01-07: 500 [IU]
  Filled 2020-01-07: qty 5

## 2020-01-07 MED ORDER — SODIUM CHLORIDE 0.9% FLUSH
10.0000 mL | INTRAVENOUS | Status: DC | PRN
  Administered 2020-01-07: 10 mL
  Filled 2020-01-07: qty 10

## 2020-01-11 ENCOUNTER — Encounter: Payer: Medicare HMO | Admitting: Occupational Therapy

## 2020-01-11 ENCOUNTER — Ambulatory Visit: Payer: Medicare HMO

## 2020-01-13 ENCOUNTER — Ambulatory Visit: Payer: Medicare HMO

## 2020-01-13 ENCOUNTER — Encounter: Payer: Medicare HMO | Admitting: Occupational Therapy

## 2020-01-16 ENCOUNTER — Other Ambulatory Visit: Payer: Self-pay | Admitting: Cardiology

## 2020-01-18 ENCOUNTER — Other Ambulatory Visit: Payer: Self-pay | Admitting: Internal Medicine

## 2020-01-18 ENCOUNTER — Ambulatory Visit: Payer: Medicare HMO

## 2020-01-18 ENCOUNTER — Encounter: Payer: Medicare HMO | Admitting: Occupational Therapy

## 2020-01-18 NOTE — Telephone Encounter (Signed)
*  STAT* If patient is at the pharmacy, call can be transferred to refill team.   1. Which medications need to be refilled? (please list name of each medication and dose if known) metoprolol succinate 100 MG  2. Which pharmacy/location (including street and city if local pharmacy) is medication to be sent to? [facility] in [place]  3. Do they need a 30 day or 90 day supply? 90 day   Patient is scheduled with Dr End on 10/27

## 2020-01-19 ENCOUNTER — Telehealth: Payer: Self-pay | Admitting: Hematology & Oncology

## 2020-01-19 ENCOUNTER — Other Ambulatory Visit: Payer: Self-pay

## 2020-01-19 ENCOUNTER — Encounter: Payer: Self-pay | Admitting: *Deleted

## 2020-01-19 ENCOUNTER — Inpatient Hospital Stay: Payer: Medicare HMO

## 2020-01-19 ENCOUNTER — Inpatient Hospital Stay (HOSPITAL_BASED_OUTPATIENT_CLINIC_OR_DEPARTMENT_OTHER): Payer: Medicare HMO | Admitting: Hematology & Oncology

## 2020-01-19 ENCOUNTER — Encounter: Payer: Self-pay | Admitting: Hematology & Oncology

## 2020-01-19 VITALS — BP 136/75 | HR 82 | Temp 98.5°F | Resp 18 | Wt 206.0 lb

## 2020-01-19 DIAGNOSIS — D500 Iron deficiency anemia secondary to blood loss (chronic): Secondary | ICD-10-CM

## 2020-01-19 DIAGNOSIS — C20 Malignant neoplasm of rectum: Secondary | ICD-10-CM | POA: Diagnosis not present

## 2020-01-19 DIAGNOSIS — C775 Secondary and unspecified malignant neoplasm of intrapelvic lymph nodes: Secondary | ICD-10-CM

## 2020-01-19 DIAGNOSIS — Z5111 Encounter for antineoplastic chemotherapy: Secondary | ICD-10-CM | POA: Diagnosis not present

## 2020-01-19 LAB — CBC WITH DIFFERENTIAL (CANCER CENTER ONLY)
Abs Immature Granulocytes: 0.03 10*3/uL (ref 0.00–0.07)
Basophils Absolute: 0 10*3/uL (ref 0.0–0.1)
Basophils Relative: 1 %
Eosinophils Absolute: 0.1 10*3/uL (ref 0.0–0.5)
Eosinophils Relative: 1 %
HCT: 31.4 % — ABNORMAL LOW (ref 39.0–52.0)
Hemoglobin: 10.4 g/dL — ABNORMAL LOW (ref 13.0–17.0)
Immature Granulocytes: 0 %
Lymphocytes Relative: 20 %
Lymphs Abs: 1.4 10*3/uL (ref 0.7–4.0)
MCH: 31.1 pg (ref 26.0–34.0)
MCHC: 33.1 g/dL (ref 30.0–36.0)
MCV: 94 fL (ref 80.0–100.0)
Monocytes Absolute: 0.7 10*3/uL (ref 0.1–1.0)
Monocytes Relative: 10 %
Neutro Abs: 4.8 10*3/uL (ref 1.7–7.7)
Neutrophils Relative %: 68 %
Platelet Count: 144 10*3/uL — ABNORMAL LOW (ref 150–400)
RBC: 3.34 MIL/uL — ABNORMAL LOW (ref 4.22–5.81)
RDW: 22.2 % — ABNORMAL HIGH (ref 11.5–15.5)
WBC Count: 7 10*3/uL (ref 4.0–10.5)
nRBC: 0 % (ref 0.0–0.2)

## 2020-01-19 LAB — CMP (CANCER CENTER ONLY)
ALT: 14 U/L (ref 0–44)
AST: 15 U/L (ref 15–41)
Albumin: 4.1 g/dL (ref 3.5–5.0)
Alkaline Phosphatase: 55 U/L (ref 38–126)
Anion gap: 7 (ref 5–15)
BUN: 27 mg/dL — ABNORMAL HIGH (ref 8–23)
CO2: 27 mmol/L (ref 22–32)
Calcium: 9.3 mg/dL (ref 8.9–10.3)
Chloride: 109 mmol/L (ref 98–111)
Creatinine: 1.23 mg/dL (ref 0.61–1.24)
GFR, Est AFR Am: >60 mL/min (ref >60)
GFR, Estimated: >60 mL/min (ref >60)
Glucose, Bld: 141 mg/dL — ABNORMAL HIGH (ref 70–99)
Potassium: 3.3 mmol/L — ABNORMAL LOW (ref 3.5–5.1)
Sodium: 143 mmol/L (ref 135–145)
Total Bilirubin: 1.3 mg/dL — ABNORMAL HIGH (ref 0.3–1.2)
Total Protein: 6.3 g/dL — ABNORMAL LOW (ref 6.5–8.1)

## 2020-01-19 LAB — FERRITIN: Ferritin: 261 ng/mL (ref 24–336)

## 2020-01-19 LAB — LACTATE DEHYDROGENASE: LDH: 288 U/L — ABNORMAL HIGH (ref 98–192)

## 2020-01-19 LAB — IRON AND TIBC
Iron: 77 ug/dL (ref 42–163)
Saturation Ratios: 20 % (ref 20–55)
TIBC: 390 ug/dL (ref 202–409)
UIBC: 313 ug/dL (ref 117–376)

## 2020-01-19 LAB — CEA (IN HOUSE-CHCC): CEA (CHCC-In House): 2.82 ng/mL (ref 0.00–5.00)

## 2020-01-19 MED ORDER — PALONOSETRON HCL INJECTION 0.25 MG/5ML
0.2500 mg | Freq: Once | INTRAVENOUS | Status: AC
  Administered 2020-01-19: 0.25 mg via INTRAVENOUS

## 2020-01-19 MED ORDER — SODIUM CHLORIDE 0.9% FLUSH
10.0000 mL | INTRAVENOUS | Status: DC | PRN
  Filled 2020-01-19: qty 10

## 2020-01-19 MED ORDER — SODIUM CHLORIDE 0.9 % IV SOLN
1920.0000 mg/m2 | INTRAVENOUS | Status: DC
  Administered 2020-01-19: 4150 mg via INTRAVENOUS
  Filled 2020-01-19: qty 83

## 2020-01-19 MED ORDER — OXALIPLATIN CHEMO INJECTION 100 MG/20ML
85.0000 mg/m2 | Freq: Once | INTRAVENOUS | Status: AC
  Administered 2020-01-19: 185 mg via INTRAVENOUS
  Filled 2020-01-19: qty 37

## 2020-01-19 MED ORDER — DEXTROSE 5 % IV SOLN
Freq: Once | INTRAVENOUS | Status: AC
  Filled 2020-01-19: qty 250

## 2020-01-19 MED ORDER — HEPARIN SOD (PORK) LOCK FLUSH 100 UNIT/ML IV SOLN
500.0000 [IU] | Freq: Once | INTRAVENOUS | Status: DC | PRN
  Filled 2020-01-19: qty 5

## 2020-01-19 MED ORDER — FLUOROURACIL CHEMO INJECTION 2.5 GM/50ML
400.0000 mg/m2 | Freq: Once | INTRAVENOUS | Status: AC
  Administered 2020-01-19: 850 mg via INTRAVENOUS
  Filled 2020-01-19: qty 17

## 2020-01-19 MED ORDER — PALONOSETRON HCL INJECTION 0.25 MG/5ML
INTRAVENOUS | Status: AC
  Filled 2020-01-19: qty 5

## 2020-01-19 MED ORDER — LEUCOVORIN CALCIUM INJECTION 350 MG
400.0000 mg/m2 | Freq: Once | INTRAVENOUS | Status: AC
  Administered 2020-01-19: 864 mg via INTRAVENOUS
  Filled 2020-01-19: qty 43.2

## 2020-01-19 MED ORDER — SODIUM CHLORIDE 0.9 % IV SOLN
10.0000 mg | Freq: Once | INTRAVENOUS | Status: AC
  Administered 2020-01-19: 10 mg via INTRAVENOUS
  Filled 2020-01-19: qty 10

## 2020-01-19 NOTE — Progress Notes (Signed)
Hematology and Oncology Follow Up Visit  John Douglas [id number] December 02, 1955 64 y.o. 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 daily  Aspirin 81 mg p.o. daily  FOLFOX --  S/p cycle #4-- started on 11/17/2019     Interim History:  John Douglas is in for follow-up.  He really looks great.  The big news is that he got engaged to his girlfriend.  He is not sure when there will actually be a wedding.  He went [place] to the mountains this past weekend.  He is very active.  He loves to go and do things.  He is eating well.  He has had no problems with nausea or vomiting.  There is no abdominal pain.  His weakness from the CVA certainly has gotten a lot better.  He really has very limited mobility at this point.  He has had no problems with bleeding.  He is on aspirin.  He has had no leg swelling.  There has been no abdominal pain.  He has had no cough or shortness of breath.  He has had no headache.  Is been no mouth sores.  I recommended that he also is Plavix.  Currently, his performance status is probably ECOG 0.      Medications:  Current Outpatient Medications:  .  acetaminophen (TYLENOL) 500 MG tablet, Take 500 mg by mouth every 6 (six) hours as needed for fever. (Patient not taking: Reported on 01/05/2020), Disp: , Rfl:  .  clonazePAM (KLONOPIN) 1 MG disintegrating tablet, Take 1 tablet (1 mg total) by mouth 2 (two) times daily as needed. (Patient not taking: Reported on 01/05/2020), Disp: 30 tablet, Rfl: 0 .  clopidogrel (PLAVIX) 75 MG tablet, Take 1 tablet (75 mg total) by mouth daily., Disp: 30 tablet, Rfl: 0 .  diltiazem (CARDIZEM CD) 180 MG 24 hr capsule, Take 1 capsule (180 mg total) by mouth daily., Disp: 30 capsule, Rfl: 1 .  ezetimibe (ZETIA) 10 MG tablet, Take 10 mg by mouth at bedtime., Disp: , Rfl:    .  fluticasone (FLONASE) 50 MCG/ACT nasal spray, Place 1-2 sprays into both nostrils as needed (for seasonal allergies).  (Patient not taking: Reported on 01/05/2020), Disp: , Rfl:  .  gabapentin (NEURONTIN) 100 MG capsule, TAKE 1 CAPSULE BY MOUTH AT BEDTIME. (Patient taking differently: Take 100 mg by mouth at bedtime. ), Disp: 30 capsule, Rfl: 3 .  glimepiride (AMARYL) 2 MG tablet, Take 1 tablet (2 mg total) by mouth daily with breakfast., Disp: 30 tablet, Rfl: 0 .  glucose blood test strip, Monitor BS before meals and at bedtime for a couple of weeks. Then may decrease to bid before meals, Disp: 100 each, Rfl: 12 .  Lancets (ONETOUCH ULTRASOFT) lancets, Use as instructed, Disp: 100 each, Rfl: 12 .  metoprolol succinate (TOPROL XL) 100 MG 24 hr tablet, Take 1 tablet (100 mg total) by mouth in the morning and at bedtime., Disp: 60 tablet, Rfl: 3 .  nitroGLYCERIN (NITROSTAT) 0.4 MG SL tablet, Place 0.4 mg under the tongue every 5 (five) minutes as needed for chest pain.  (Patient not taking: Reported on 01/05/2020), Disp: , Rfl:  .  ondansetron (ZOFRAN) 8 MG tablet, Take 1 tablet (8 mg total) by mouth 2 (two) times daily as needed for refractory nausea / vomiting. Start on day  3 after chemotherapy. (Patient not taking: Reported on 01/05/2020), Disp: 30 tablet, Rfl: 1 .  prochlorperazine (COMPAZINE) 10 MG tablet, Take 1 tablet (10 mg total) by mouth every 6 (six) hours as needed (Nausea or vomiting). (Patient not taking: Reported on 01/05/2020), Disp: 30 tablet, Rfl: 1 .  rosuvastatin (CRESTOR) 20 MG tablet, Take 1 tablet (20 mg total) by mouth daily. (Patient taking differently: Take 20 mg by mouth at bedtime. ), Disp: 30 tablet, Rfl: 0 .  sertraline (ZOLOFT) 50 MG tablet, Take 50 mg by mouth daily., Disp: , Rfl:  .  traZODone (DESYREL) 50 MG tablet, TAKE 1 TABLET (50 MG TOTAL) BY MOUTH AT BEDTIME AS NEEDED FOR SLEEP. (Patient not taking: Reported on 01/05/2020), Disp: 30 tablet, Rfl: 3  Allergies:  Allergies   Allergen Reactions  . Ibuprofen Other (See Comments)    Was told to not take this   . Metformin And Related Other (See Comments)    Bloody stools   . Naproxen Other (See Comments)    Was told to not take this   . Yellow Jacket Venom [Bee Venom] Swelling and Other (See Comments)    Severe swelling where stung  . Penicillins Hives    Did it involve swelling of the face/tongue/throat, SOB, or low BP? Unk Did it involve sudden or severe rash/hives, skin peeling, or any reaction on the inside of your mouth or nose? Yes Did you need to seek medical attention at a hospital or doctor's office? Unk When did it last happen?"Childhood- 55 years ago" If all above answers are "NO", may proceed with cephalosporin use.   . Celexa [Citalopram]     Contraindicated with A-Fib    Past Medical History, Surgical history, Social history, and Family History were reviewed and updated.  Review of Systems: Review of Systems  Constitutional: Negative.   HENT:  Negative.   Eyes: Negative.   Respiratory: Negative.   Cardiovascular: Positive for palpitations.  Gastrointestinal: Positive for blood in stool.  Endocrine: Negative.   Genitourinary: Negative.    Skin: Negative.   Neurological: Positive for extremity weakness.  Hematological: Negative.   Psychiatric/Behavioral: Negative.     Physical Exam:  vitals were not taken for this visit.   Wt Readings from Last 3 Encounters:  01/05/20 209 lb (94.8 kg)  01/05/20 209 lb (94.8 kg)  12/22/19 206 lb (93.4 kg)    Physical Exam Vitals reviewed.  HENT:     Head: Normocephalic and atraumatic.  Eyes:     Pupils: Pupils are equal, round, and reactive to light.  Cardiovascular:     Rate and Rhythm: Normal rate and regular rhythm.     Heart sounds: Normal heart sounds.     Comments: Cardiac exam shows a tachycardic rate.  The rate is somewhat regular.  There may be occasional extra beat.  There are no murmurs, rubs or bruits. Pulmonary:      Effort: Pulmonary effort is normal.     Breath sounds: Normal breath sounds.  Abdominal:     General: Bowel sounds are normal.     Palpations: Abdomen is soft.     Comments: Abdominal exam is soft.  He has decent bowel sounds.  There is no guarding or rebound tenderness.  There is no fluid wave.  There is no palpable liver or spleen tip.  Musculoskeletal:        General: No tenderness or deformity. Normal range of motion.     Cervical back: Normal range of motion.  Lymphadenopathy:     Cervical: No cervical adenopathy.  Skin:    General: Skin is warm and dry.     Findings: No erythema or rash.  Neurological:     Mental Status: He is alert and oriented to person, place, and time.  Psychiatric:        Behavior: Behavior normal.        Thought Content: Thought content normal.        Judgment: Judgment normal.      Lab Results  Component Value Date   WBC 7.0 01/19/2020   HGB 10.4 (L) 01/19/2020   HCT 31.4 (L) 01/19/2020   MCV 94.0 01/19/2020   PLT 144 (L) 01/19/2020     Chemistry      Component Value Date/Time   NA 141 01/05/2020 0831   NA 140 08/28/2019 0940   K 3.8 01/05/2020 0831   CL 107 01/05/2020 0831   CO2 27 01/05/2020 0831   BUN 30 (H) 01/05/2020 0831   BUN 21 08/28/2019 0940   CREATININE 1.02 01/05/2020 0831      Component Value Date/Time   CALCIUM 9.3 01/05/2020 0831   ALKPHOS 59 01/05/2020 0831   AST 12 (L) 01/05/2020 0831   ALT 13 01/05/2020 0831   BILITOT 1.1 01/05/2020 0831      Impression and Plan: [name] is a very nice 64 year old white male.  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  He had a rectal bleeding.  He subsequently was found to have rectal cancer.  He had adenopathy that was distant to the rectum.  We will proceed with his fifth cycle of chemotherapy.  I would probably do an additional cycle of treatment and then repeat a CT scan after his 6th cycle of treatment.  The fact that his CEA is back to normal  is certainly encouraging.  His anemia is relatively stable.  We will see what his iron studies look like.    I will plan to get him back in 2 more weeks.  Volanda Napoleon, MD 9/21/20218:59 AM

## 2020-01-19 NOTE — Progress Notes (Signed)
Oncology Nurse Navigator Documentation  Oncology Nurse Navigator Flowsheets 01/19/2020  Abnormal Finding Date -  Confirmed Diagnosis Date -  Planned Course of Treatment -  Phase of Treatment -  Chemotherapy Actual Start Date: -  Navigator Follow Up Date: 02/02/2020  Navigator Follow Up Reason: Follow-up Appointment;Chemotherapy  Navigator Restaurant manager, fast food Encounter Type Treatment  Treatment Initiated Date -  Patient Visit Type MedOnc  Treatment Phase Active Tx  Barriers/Navigation Needs No Barriers At This Time  Education -  Interventions Psycho-Social Support  Acuity Level 1-No Barriers  Referrals -  Coordination of Care -  Education Method -  Support Groups/Services Friends and Family  Time Spent with Patient 15

## 2020-01-19 NOTE — Telephone Encounter (Signed)
Sent patient a Mychart message informing him he should still have refills on this medication at his pharmacy.

## 2020-01-19 NOTE — Patient Instructions (Signed)
[place] Discharge Instructions for Patients Receiving Chemotherapy  Today you received the following chemotherapy agents Oxaliplatin, 5FU  To help prevent nausea and vomiting after your treatment, we encourage you to take your nausea medication    If you develop nausea and vomiting that is not controlled by your nausea medication, call the clinic.   BELOW ARE SYMPTOMS THAT SHOULD BE REPORTED IMMEDIATELY:  *FEVER GREATER THAN 100.5 F  *CHILLS WITH OR WITHOUT FEVER  NAUSEA AND VOMITING THAT IS NOT CONTROLLED WITH YOUR NAUSEA MEDICATION  *UNUSUAL SHORTNESS OF BREATH  *UNUSUAL BRUISING OR BLEEDING  TENDERNESS IN MOUTH AND THROAT WITH OR WITHOUT PRESENCE OF ULCERS  *URINARY PROBLEMS  *BOWEL PROBLEMS  UNUSUAL RASH Items with * indicate a potential emergency and should be followed up as soon as possible.  Feel free to call the clinic should you have any questions or concerns. The clinic phone number is [phone].  Please show the [place] at check-in to the Emergency Department and triage nurse.

## 2020-01-19 NOTE — Telephone Encounter (Signed)
Appointments were already scheduled previously per 9/21 los

## 2020-01-19 NOTE — Patient Instructions (Signed)

## 2020-01-20 ENCOUNTER — Encounter: Payer: Medicare HMO | Admitting: Occupational Therapy

## 2020-01-20 ENCOUNTER — Ambulatory Visit: Payer: Medicare HMO

## 2020-01-21 ENCOUNTER — Other Ambulatory Visit: Payer: Self-pay

## 2020-01-21 ENCOUNTER — Inpatient Hospital Stay: Payer: Medicare HMO

## 2020-01-21 VITALS — BP 124/88 | HR 47 | Temp 98.1°F | Resp 18

## 2020-01-21 DIAGNOSIS — C775 Secondary and unspecified malignant neoplasm of intrapelvic lymph nodes: Secondary | ICD-10-CM

## 2020-01-21 DIAGNOSIS — Z5111 Encounter for antineoplastic chemotherapy: Secondary | ICD-10-CM | POA: Diagnosis not present

## 2020-01-21 DIAGNOSIS — C20 Malignant neoplasm of rectum: Secondary | ICD-10-CM

## 2020-01-21 MED ORDER — SODIUM CHLORIDE 0.9% FLUSH
10.0000 mL | INTRAVENOUS | Status: DC | PRN
  Administered 2020-01-21: 10 mL
  Filled 2020-01-21: qty 10

## 2020-01-21 MED ORDER — HEPARIN SOD (PORK) LOCK FLUSH 100 UNIT/ML IV SOLN
500.0000 [IU] | Freq: Once | INTRAVENOUS | Status: AC | PRN
  Administered 2020-01-21: 500 [IU]
  Filled 2020-01-21: qty 5

## 2020-01-25 ENCOUNTER — Ambulatory Visit: Payer: Medicare HMO

## 2020-01-25 ENCOUNTER — Encounter: Payer: Medicare HMO | Admitting: Occupational Therapy

## 2020-01-27 ENCOUNTER — Ambulatory Visit: Payer: Medicare HMO

## 2020-01-27 ENCOUNTER — Encounter: Payer: Medicare HMO | Admitting: Occupational Therapy

## 2020-01-27 ENCOUNTER — Encounter: Payer: Self-pay | Admitting: Occupational Therapy

## 2020-01-27 DIAGNOSIS — M6281 Muscle weakness (generalized): Secondary | ICD-10-CM

## 2020-01-27 NOTE — Therapy (Signed)
[place] [facility] SERVICES [address] [place], [place], [address] Phone: [phone]   Fax:  [phone]  January 27, 2020    No Recipients  Occupational Therapy Discharge Summary   Patient: John Douglas MRN: [id number] Date of Birth: Jul 03, 1955  Diagnosis: Muscle weakness (generalized)  No data recorded  The above patient had been seen in Occupational Therapy 1 time.  The treatment consisted of an Initial Eval The patient is: uncahnged  Subjective:   Pt. Arrived, and participated in an initial OT Eval. No treatment plan or goals were  Indicated. OT services are discharged.   Sincerely,  Harrel Carina, MS, OTR/L  CC No Recipients  [place] [facility] SERVICES [address] [place], [place], [address] Phone: [phone]   Fax:  [phone]  Patient: John Douglas MRN: [id number] Date of Birth: 15-Aug-1955

## 2020-02-02 ENCOUNTER — Inpatient Hospital Stay (HOSPITAL_BASED_OUTPATIENT_CLINIC_OR_DEPARTMENT_OTHER): Payer: Medicare HMO | Admitting: Hematology & Oncology

## 2020-02-02 ENCOUNTER — Encounter: Payer: Self-pay | Admitting: Hematology & Oncology

## 2020-02-02 ENCOUNTER — Inpatient Hospital Stay: Payer: Medicare HMO

## 2020-02-02 ENCOUNTER — Encounter: Payer: Self-pay | Admitting: *Deleted

## 2020-02-02 ENCOUNTER — Inpatient Hospital Stay: Payer: Medicare HMO | Attending: Hematology & Oncology

## 2020-02-02 ENCOUNTER — Telehealth: Payer: Self-pay | Admitting: Hematology & Oncology

## 2020-02-02 ENCOUNTER — Other Ambulatory Visit: Payer: Self-pay

## 2020-02-02 VITALS — BP 127/74 | HR 81 | Temp 98.4°F | Resp 18 | Ht 70.0 in | Wt 214.0 lb

## 2020-02-02 DIAGNOSIS — Z7901 Long term (current) use of anticoagulants: Secondary | ICD-10-CM | POA: Insufficient documentation

## 2020-02-02 DIAGNOSIS — Z8673 Personal history of transient ischemic attack (TIA), and cerebral infarction without residual deficits: Secondary | ICD-10-CM | POA: Insufficient documentation

## 2020-02-02 DIAGNOSIS — Z452 Encounter for adjustment and management of vascular access device: Secondary | ICD-10-CM | POA: Insufficient documentation

## 2020-02-02 DIAGNOSIS — C775 Secondary and unspecified malignant neoplasm of intrapelvic lymph nodes: Secondary | ICD-10-CM

## 2020-02-02 DIAGNOSIS — Z5111 Encounter for antineoplastic chemotherapy: Secondary | ICD-10-CM | POA: Diagnosis present

## 2020-02-02 DIAGNOSIS — C20 Malignant neoplasm of rectum: Secondary | ICD-10-CM

## 2020-02-02 DIAGNOSIS — I4891 Unspecified atrial fibrillation: Secondary | ICD-10-CM | POA: Insufficient documentation

## 2020-02-02 DIAGNOSIS — D500 Iron deficiency anemia secondary to blood loss (chronic): Secondary | ICD-10-CM

## 2020-02-02 LAB — FERRITIN: Ferritin: 221 ng/mL (ref 24–336)

## 2020-02-02 LAB — CBC WITH DIFFERENTIAL (CANCER CENTER ONLY)
Abs Immature Granulocytes: 0.04 10*3/uL (ref 0.00–0.07)
Basophils Absolute: 0 10*3/uL (ref 0.0–0.1)
Basophils Relative: 0 %
Eosinophils Absolute: 0.1 10*3/uL (ref 0.0–0.5)
Eosinophils Relative: 1 %
HCT: 31.4 % — ABNORMAL LOW (ref 39.0–52.0)
Hemoglobin: 10.5 g/dL — ABNORMAL LOW (ref 13.0–17.0)
Immature Granulocytes: 1 %
Lymphocytes Relative: 19 %
Lymphs Abs: 1.3 10*3/uL (ref 0.7–4.0)
MCH: 32.1 pg (ref 26.0–34.0)
MCHC: 33.4 g/dL (ref 30.0–36.0)
MCV: 96 fL (ref 80.0–100.0)
Monocytes Absolute: 0.7 10*3/uL (ref 0.1–1.0)
Monocytes Relative: 10 %
Neutro Abs: 4.7 10*3/uL (ref 1.7–7.7)
Neutrophils Relative %: 69 %
Platelet Count: 110 10*3/uL — ABNORMAL LOW (ref 150–400)
RBC: 3.27 MIL/uL — ABNORMAL LOW (ref 4.22–5.81)
RDW: 21.7 % — ABNORMAL HIGH (ref 11.5–15.5)
WBC Count: 6.9 10*3/uL (ref 4.0–10.5)
nRBC: 0 % (ref 0.0–0.2)

## 2020-02-02 LAB — LACTATE DEHYDROGENASE: LDH: 263 U/L — ABNORMAL HIGH (ref 98–192)

## 2020-02-02 LAB — CMP (CANCER CENTER ONLY)
ALT: 14 U/L (ref 0–44)
AST: 14 U/L — ABNORMAL LOW (ref 15–41)
Albumin: 3.9 g/dL (ref 3.5–5.0)
Alkaline Phosphatase: 53 U/L (ref 38–126)
Anion gap: 6 (ref 5–15)
BUN: 27 mg/dL — ABNORMAL HIGH (ref 8–23)
CO2: 27 mmol/L (ref 22–32)
Calcium: 9.4 mg/dL (ref 8.9–10.3)
Chloride: 111 mmol/L (ref 98–111)
Creatinine: 1.08 mg/dL (ref 0.61–1.24)
GFR, Estimated: >60 mL/min (ref >60)
Glucose, Bld: 126 mg/dL — ABNORMAL HIGH (ref 70–99)
Potassium: 3.6 mmol/L (ref 3.5–5.1)
Sodium: 144 mmol/L (ref 135–145)
Total Bilirubin: 1.1 mg/dL (ref 0.3–1.2)
Total Protein: 5.9 g/dL — ABNORMAL LOW (ref 6.5–8.1)

## 2020-02-02 LAB — IRON AND TIBC
Iron: 67 ug/dL (ref 42–163)
Saturation Ratios: 18 % — ABNORMAL LOW (ref 20–55)
TIBC: 384 ug/dL (ref 202–409)
UIBC: 316 ug/dL (ref 117–376)

## 2020-02-02 LAB — CEA (IN HOUSE-CHCC): CEA (CHCC-In House): 2.72 ng/mL (ref 0.00–5.00)

## 2020-02-02 MED ORDER — LEUCOVORIN CALCIUM INJECTION 350 MG
400.0000 mg/m2 | Freq: Once | INTRAVENOUS | Status: AC
  Administered 2020-02-02: 864 mg via INTRAVENOUS
  Filled 2020-02-02: qty 43.2

## 2020-02-02 MED ORDER — SODIUM CHLORIDE 0.9 % IV SOLN
1920.0000 mg/m2 | INTRAVENOUS | Status: DC
  Administered 2020-02-02: 4150 mg via INTRAVENOUS
  Filled 2020-02-02: qty 83

## 2020-02-02 MED ORDER — PALONOSETRON HCL INJECTION 0.25 MG/5ML
0.2500 mg | Freq: Once | INTRAVENOUS | Status: AC
  Administered 2020-02-02: 0.25 mg via INTRAVENOUS

## 2020-02-02 MED ORDER — SODIUM CHLORIDE 0.9 % IV SOLN
10.0000 mg | Freq: Once | INTRAVENOUS | Status: AC
  Administered 2020-02-02: 10 mg via INTRAVENOUS
  Filled 2020-02-02: qty 1

## 2020-02-02 MED ORDER — DEXTROSE 5 % IV SOLN
Freq: Once | INTRAVENOUS | Status: AC
  Filled 2020-02-02: qty 250

## 2020-02-02 MED ORDER — FLUOROURACIL CHEMO INJECTION 2.5 GM/50ML
400.0000 mg/m2 | Freq: Once | INTRAVENOUS | Status: AC
  Administered 2020-02-02: 850 mg via INTRAVENOUS
  Filled 2020-02-02: qty 17

## 2020-02-02 MED ORDER — APIXABAN 5 MG PO TABS: 5.0000 mg | ORAL_TABLET | Freq: Two times a day (BID) | ORAL | 5 refills | Status: DC

## 2020-02-02 MED ORDER — OXALIPLATIN CHEMO INJECTION 100 MG/20ML
85.0000 mg/m2 | Freq: Once | INTRAVENOUS | Status: AC
  Administered 2020-02-02: 185 mg via INTRAVENOUS
  Filled 2020-02-02: qty 37

## 2020-02-02 MED ORDER — PALONOSETRON HCL INJECTION 0.25 MG/5ML
INTRAVENOUS | Status: AC
  Filled 2020-02-02: qty 5

## 2020-02-02 NOTE — Progress Notes (Signed)
Hematology and Oncology Follow Up Visit  John Douglas [id number] 1955/08/31 64 y.o. 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 daily  Aspirin 81 mg p.o. daily  FOLFOX --  S/p cycle #5-- started on 11/17/2019     Interim History:  John Douglas is in for follow-up.  As always, he has been doing quite well.  He seems to be having more in the way the atrial fibrillation.  He is on Plavix.  However, I think that he probably needs to go on to Eliquis.  I really do not see a problem with him being on both Plavix and Eliquis.  He has had no problems with abdominal pain.  He is eating well.  He has had no obvious change in bowel or bladder habits.  He has had no cough or shortness of breath.  The residual effects from the CVA that he had continue to improve.  His last CEA level was 2.82.  Currently, his performance status is probably ECOG 0.      Medications:  Current Outpatient Medications:  .  acetaminophen (TYLENOL) 500 MG tablet, Take 500 mg by mouth every 6 (six) hours as needed for fever. , Disp: , Rfl:  .  clonazePAM (KLONOPIN) 1 MG disintegrating tablet, Take 1 tablet (1 mg total) by mouth 2 (two) times daily as needed., Disp: 30 tablet, Rfl: 0 .  clopidogrel (PLAVIX) 75 MG tablet, Take 1 tablet (75 mg total) by mouth daily., Disp: 30 tablet, Rfl: 0 .  diltiazem (CARDIZEM CD) 180 MG 24 hr capsule, Take 1 capsule (180 mg total) by mouth daily., Disp: 30 capsule, Rfl: 1 .  ezetimibe (ZETIA) 10 MG tablet, Take 10 mg by mouth at bedtime., Disp: , Rfl:  .  fluticasone (FLONASE) 50 MCG/ACT nasal spray, Place 1-2 sprays into both nostrils as needed (for seasonal allergies). , Disp: , Rfl:  .  gabapentin (NEURONTIN) 100 MG capsule, TAKE 1 CAPSULE BY MOUTH AT BEDTIME. (Patient taking differently: Take 100 mg by mouth  at bedtime. ), Disp: 30 capsule, Rfl: 3 .  glimepiride (AMARYL) 2 MG tablet, Take 1 tablet (2 mg total) by mouth daily with breakfast., Disp: 30 tablet, Rfl: 0 .  glucose blood test strip, Monitor BS before meals and at bedtime for a couple of weeks. Then may decrease to bid before meals, Disp: 100 each, Rfl: 12 .  Lancets (ONETOUCH ULTRASOFT) lancets, Use as instructed, Disp: 100 each, Rfl: 12 .  metoprolol succinate (TOPROL XL) 100 MG 24 hr tablet, Take 1 tablet (100 mg total) by mouth in the morning and at bedtime., Disp: 60 tablet, Rfl: 3 .  ondansetron (ZOFRAN) 8 MG tablet, Take 1 tablet (8 mg total) by mouth 2 (two) times daily as needed for refractory nausea / vomiting. Start on day 3 after chemotherapy., Disp: 30 tablet, Rfl: 1 .  prochlorperazine (COMPAZINE) 10 MG tablet, Take 1 tablet (10 mg total) by mouth every 6 (six) hours as needed (Nausea or vomiting)., Disp: 30 tablet, Rfl: 1 .  sertraline (ZOLOFT) 50 MG tablet, Take 50 mg by mouth daily., Disp: , Rfl:  .  traZODone (DESYREL) 50 MG tablet, TAKE 1 TABLET (50 MG TOTAL) BY MOUTH AT BEDTIME AS NEEDED FOR SLEEP., Disp: 30 tablet, Rfl: 3 .  nitroGLYCERIN (NITROSTAT) 0.4 MG SL tablet, Place 0.4 mg under  the tongue every 5 (five) minutes as needed for chest pain.  (Patient not taking: Reported on 01/05/2020), Disp: , Rfl:  .  rosuvastatin (CRESTOR) 20 MG tablet, Take 1 tablet (20 mg total) by mouth daily. (Patient taking differently: Take 20 mg by mouth at bedtime. ), Disp: 30 tablet, Rfl: 0  Allergies:  Allergies  Allergen Reactions  . Celexa [Citalopram] Other (See Comments)    Contraindicated with A-Fib  . Ibuprofen Other (See Comments)    Was told to not take this   . Metformin And Related Other (See Comments)    Bloody stools   . Naproxen Other (See Comments)    Was told to not take this   . Yellow Jacket Venom [Bee Venom] Swelling and Other (See Comments)    Severe swelling where stung  . Penicillins Hives    Did it involve  swelling of the face/tongue/throat, SOB, or low BP? Unk Did it involve sudden or severe rash/hives, skin peeling, or any reaction on the inside of your mouth or nose? Yes Did you need to seek medical attention at a hospital or doctor's office? Unk When did it last happen?"Childhood- 55 years ago" If all above answers are "NO", may proceed with cephalosporin use.     Past Medical History, Surgical history, Social history, and Family History were reviewed and updated.  Review of Systems: Review of Systems  Constitutional: Negative.   HENT:  Negative.   Eyes: Negative.   Respiratory: Negative.   Cardiovascular: Positive for palpitations.  Gastrointestinal: Positive for blood in stool.  Endocrine: Negative.   Genitourinary: Negative.    Skin: Negative.   Neurological: Positive for extremity weakness.  Hematological: Negative.   Psychiatric/Behavioral: Negative.     Physical Exam:  height is $RemoveB'5\' 10"'xrQEBcXX$  (1.778 m) and weight is 214 lb (97.1 kg). His oral temperature is 98.4 F (36.9 C). His blood pressure is 127/74 and his pulse is 81. His respiration is 18 and oxygen saturation is 100%.   Wt Readings from Last 3 Encounters:  02/02/20 214 lb (97.1 kg)  01/19/20 206 lb (93.4 kg)  01/05/20 209 lb (94.8 kg)    Physical Exam Vitals reviewed.  HENT:     Head: Normocephalic and atraumatic.  Eyes:     Pupils: Pupils are equal, round, and reactive to light.  Cardiovascular:     Rate and Rhythm: Normal rate and regular rhythm.     Heart sounds: Normal heart sounds.     Comments: Cardiac exam shows an irregular rate and irregular rhythm consistent with atrial fibrillation.  The rate is fairly well controlled.   There are no murmurs, rubs or bruits. Pulmonary:     Effort: Pulmonary effort is normal.     Breath sounds: Normal breath sounds.  Abdominal:     General: Bowel sounds are normal.     Palpations: Abdomen is soft.     Comments: Abdominal exam is soft.  He has decent bowel  sounds.  There is no guarding or rebound tenderness.  There is no fluid wave.  There is no palpable liver or spleen tip.  Musculoskeletal:        General: No tenderness or deformity. Normal range of motion.     Cervical back: Normal range of motion.  Lymphadenopathy:     Cervical: No cervical adenopathy.  Skin:    General: Skin is warm and dry.     Findings: No erythema or rash.  Neurological:     Mental Status: He is  alert and oriented to person, place, and time.  Psychiatric:        Behavior: Behavior normal.        Thought Content: Thought content normal.        Judgment: Judgment normal.      Lab Results  Component Value Date   WBC 6.9 02/02/2020   HGB 10.5 (L) 02/02/2020   HCT 31.4 (L) 02/02/2020   MCV 96.0 02/02/2020   PLT 110 (L) 02/02/2020     Chemistry      Component Value Date/Time   NA 143 01/19/2020 0830   NA 140 08/28/2019 0940   K 3.3 (L) 01/19/2020 0830   CL 109 01/19/2020 0830   CO2 27 01/19/2020 0830   BUN 27 (H) 01/19/2020 0830   BUN 21 08/28/2019 0940   CREATININE 1.23 01/19/2020 0830      Component Value Date/Time   CALCIUM 9.3 01/19/2020 0830   ALKPHOS 55 01/19/2020 0830   AST 15 01/19/2020 0830   ALT 14 01/19/2020 0830   BILITOT 1.3 (H) 01/19/2020 0830      Impression and Plan: John Douglas is a very nice 64 year old white male.  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  He had a rectal bleeding.  He subsequently was found to have rectal cancer.  He had adenopathy that was distant to the rectum.  We will proceed with his sixth cycle of chemotherapy.  After this cycle, we will then do a CT scan.  We will also get him on Eliquis.  He has atrial fibrillation.  I do worry about the possibility of another cardiovascular or cerebrovascular event.  I know he is on Plavix but I think Eliquis and Plavix together would be okay.  He really is not gotten thrombocytopenic to any great degree.  We will give him an extra week off  after the sixth cycle of treatment.  We will get him back in 3 weeks.    Volanda Napoleon, MD 10/5/20218:43 AM

## 2020-02-02 NOTE — Progress Notes (Signed)
Patient doing well and continues to have minimal side effects from treatment. He needs a CT scan prior to his next appointment. Instructed the patient to stop by radiology on the way out and pick up his contrast, and schedule his scan for sometime before 10/27. Patient understood.  Oncology Nurse Navigator Documentation  Oncology Nurse Navigator Flowsheets 02/02/2020  Abnormal Finding Date -  Confirmed Diagnosis Date -  Planned Course of Treatment -  Phase of Treatment -  Chemotherapy Actual Start Date: -  Navigator Follow Up Date: 02/23/2020  Navigator Follow Up Reason: Radiology  Navigator Location [facility]  Navigator Encounter Type Treatment  Treatment Initiated Date -  Patient Visit Type MedOnc  Treatment Phase Active Tx  Barriers/Navigation Needs Coordination of Care;Education  Education Other  Interventions Coordination of Care;Psycho-Social Support  Acuity Level 1-No Barriers  Referrals -  Coordination of Care Appts;Radiology  Education Method Verbal  Support Groups/Services Friends and Family  Time Spent with Patient 20

## 2020-02-02 NOTE — Patient Instructions (Signed)
[place] Discharge Instructions for Patients Receiving Chemotherapy  Today you received the following chemotherapy agents Oxaliplatin, 5FU  To help prevent nausea and vomiting after your treatment, we encourage you to take your nausea medication    If you develop nausea and vomiting that is not controlled by your nausea medication, call the clinic.   BELOW ARE SYMPTOMS THAT SHOULD BE REPORTED IMMEDIATELY:  *FEVER GREATER THAN 100.5 F  *CHILLS WITH OR WITHOUT FEVER  NAUSEA AND VOMITING THAT IS NOT CONTROLLED WITH YOUR NAUSEA MEDICATION  *UNUSUAL SHORTNESS OF BREATH  *UNUSUAL BRUISING OR BLEEDING  TENDERNESS IN MOUTH AND THROAT WITH OR WITHOUT PRESENCE OF ULCERS  *URINARY PROBLEMS  *BOWEL PROBLEMS  UNUSUAL RASH Items with * indicate a potential emergency and should be followed up as soon as possible.  Feel free to call the clinic should you have any questions or concerns. The clinic phone number is [phone].  Please show the [place] at check-in to the Emergency Department and triage nurse.

## 2020-02-02 NOTE — Telephone Encounter (Signed)
Appointments scheduled calendar printed per 10/5 los °

## 2020-02-04 ENCOUNTER — Inpatient Hospital Stay: Payer: Medicare HMO

## 2020-02-04 ENCOUNTER — Other Ambulatory Visit: Payer: Self-pay

## 2020-02-04 ENCOUNTER — Other Ambulatory Visit: Payer: Self-pay | Admitting: Internal Medicine

## 2020-02-04 VITALS — BP 128/94 | HR 85 | Temp 98.4°F | Resp 17

## 2020-02-04 DIAGNOSIS — Z5111 Encounter for antineoplastic chemotherapy: Secondary | ICD-10-CM | POA: Diagnosis not present

## 2020-02-04 DIAGNOSIS — C775 Secondary and unspecified malignant neoplasm of intrapelvic lymph nodes: Secondary | ICD-10-CM

## 2020-02-04 DIAGNOSIS — I4891 Unspecified atrial fibrillation: Secondary | ICD-10-CM

## 2020-02-04 DIAGNOSIS — I4892 Unspecified atrial flutter: Secondary | ICD-10-CM

## 2020-02-04 MED ORDER — HEPARIN SOD (PORK) LOCK FLUSH 100 UNIT/ML IV SOLN
500.0000 [IU] | Freq: Once | INTRAVENOUS | Status: AC | PRN
  Administered 2020-02-04: 500 [IU]
  Filled 2020-02-04: qty 5

## 2020-02-04 MED ORDER — SODIUM CHLORIDE 0.9% FLUSH
10.0000 mL | INTRAVENOUS | Status: DC | PRN
  Administered 2020-02-04: 10 mL
  Filled 2020-02-04: qty 10

## 2020-02-16 ENCOUNTER — Telehealth: Payer: Self-pay | Admitting: *Deleted

## 2020-02-16 ENCOUNTER — Telehealth: Payer: Self-pay | Admitting: Neurology

## 2020-02-16 NOTE — Telephone Encounter (Signed)
Pt's daughter called stating that she will not be able to come with her father to the appt so she would like to speak to the RN to inform all of what has been going on with the pt. She states that the pt has been aggressive, yelling, screaming, fighting people and just out of control temper. Nothing physical yet. Please call back when available.

## 2020-02-16 NOTE — Telephone Encounter (Signed)
Call received from patient's daughter, [name] concerned that patient is having issues with "anger and his temper being out of control."  [name] states that she has also called pt.'s Neurologist this AM as well and is waiting for a call back.  Informed Krystle that Dr. Marin Olp is out of the office this week and that I would inform him of patient status when he is back.  Instructed [name] to have family member attend pt.'s appt next week with Dr. Marin Olp.  [name] is appreciative of assistance and states that she will come with pt to his appt next week with Dr. Marin Olp.

## 2020-02-17 ENCOUNTER — Other Ambulatory Visit (HOSPITAL_COMMUNITY): Payer: Self-pay | Admitting: Nurse Practitioner

## 2020-02-17 NOTE — Telephone Encounter (Signed)
Patient was seen by me in June for stroke in the hospital and since then has not followed up in the office.  A lot could have happened since then.  Advised the patient to start by seeing the primary care physician first and patient can be referred to the office for new consult for abnormal behavior.  It could be first available physician not necessarily me if he needs to be seen sooner

## 2020-02-17 NOTE — Telephone Encounter (Signed)
Called and spoke to patient's daughter Luna Glasgow), stated she wasn't sure if Mr. Lapinsky is going to come to his appointment.  Stated that after his stroke in June, he was super nice, now he has started being mean.  Describes his personality as completely different, has anger issues and outbursts, cussing out family in restaurants.  She said he doesn't even realize what he is doing.  Doesn't believe anything anyone tells him.  She believes he is eating/drinking normally, and no complaints with sleep, however, he naps all the time.  Patient did admit to having anxiety.  Was diagnosed with colon cancer same week as stroke, June 2021.  He was taken off his medicine to prep for colonoscopy and that is when he had his stroke.    Please advise.  Family is wanting recommendations.    Told patient we would be back in contact to follow up.

## 2020-02-18 ENCOUNTER — Inpatient Hospital Stay: Payer: Medicare HMO | Admitting: Neurology

## 2020-02-18 ENCOUNTER — Encounter: Payer: Self-pay | Admitting: Neurology

## 2020-02-18 NOTE — Telephone Encounter (Signed)
Called daughter, advised her of Dr. Clydene Fake recommendations.  Also patient no showed appointment he had today.  She expressed appreciation and stated she will do her best to get him into his PCP, that he may be more likely to go and be seen by him versus a neurologist/specialist at this time.

## 2020-02-23 ENCOUNTER — Other Ambulatory Visit: Payer: Self-pay

## 2020-02-23 ENCOUNTER — Ambulatory Visit (HOSPITAL_BASED_OUTPATIENT_CLINIC_OR_DEPARTMENT_OTHER)
Admission: RE | Admit: 2020-02-23 | Discharge: 2020-02-23 | Disposition: A | Discharge status: Home / Self Care | Payer: Medicare HMO | Source: Ambulatory Visit | Attending: Hematology & Oncology | Admitting: Hematology & Oncology

## 2020-02-23 ENCOUNTER — Encounter: Payer: Self-pay | Admitting: *Deleted

## 2020-02-23 DIAGNOSIS — C20 Malignant neoplasm of rectum: Secondary | ICD-10-CM | POA: Diagnosis not present

## 2020-02-23 DIAGNOSIS — C775 Secondary and unspecified malignant neoplasm of intrapelvic lymph nodes: Secondary | ICD-10-CM | POA: Insufficient documentation

## 2020-02-23 MED ORDER — IOHEXOL 300 MG/ML  SOLN
100.0000 mL | Freq: Once | INTRAMUSCULAR | Status: AC | PRN
  Administered 2020-02-23: 100 mL via INTRAVENOUS

## 2020-02-23 NOTE — Progress Notes (Signed)
Oncology Nurse Navigator Documentation  Oncology Nurse Navigator Flowsheets 02/23/2020  Abnormal Finding Date -  Confirmed Diagnosis Date -  Planned Course of Treatment -  Phase of Treatment -  Chemotherapy Actual Start Date: -  Navigator Follow Up Date: 02/24/2020  Navigator Follow Up Reason: Follow-up Appointment  Navigator Location [facility]  Navigator Encounter Type Scan Review  Treatment Initiated Date -  Patient Visit Type MedOnc  Treatment Phase Active Tx  Barriers/Navigation Needs Coordination of Care;Education  Education -  Interventions None Required  Acuity Level 1-No Barriers  Referrals -  Coordination of Care -  Education Method -  Support Groups/Services Friends and Family  Time Spent with Patient 15

## 2020-02-24 ENCOUNTER — Encounter: Payer: Self-pay | Admitting: Hematology & Oncology

## 2020-02-24 ENCOUNTER — Encounter: Payer: Self-pay | Admitting: Internal Medicine

## 2020-02-24 ENCOUNTER — Inpatient Hospital Stay: Payer: Medicare HMO

## 2020-02-24 ENCOUNTER — Inpatient Hospital Stay: Payer: Medicare HMO | Admitting: Hematology & Oncology

## 2020-02-24 ENCOUNTER — Other Ambulatory Visit: Payer: Self-pay

## 2020-02-24 ENCOUNTER — Ambulatory Visit (INDEPENDENT_AMBULATORY_CARE_PROVIDER_SITE_OTHER): Payer: Medicare HMO | Admitting: Internal Medicine

## 2020-02-24 ENCOUNTER — Encounter: Payer: Self-pay | Admitting: *Deleted

## 2020-02-24 VITALS — BP 140/90 | HR 100 | Ht 70.0 in | Wt 216.0 lb

## 2020-02-24 VITALS — BP 157/84 | HR 80 | Temp 98.6°F | Resp 20 | Wt 214.0 lb

## 2020-02-24 DIAGNOSIS — C775 Secondary and unspecified malignant neoplasm of intrapelvic lymph nodes: Secondary | ICD-10-CM

## 2020-02-24 DIAGNOSIS — C20 Malignant neoplasm of rectum: Secondary | ICD-10-CM

## 2020-02-24 DIAGNOSIS — I2510 Atherosclerotic heart disease of native coronary artery without angina pectoris: Secondary | ICD-10-CM

## 2020-02-24 DIAGNOSIS — I4819 Other persistent atrial fibrillation: Secondary | ICD-10-CM | POA: Diagnosis not present

## 2020-02-24 DIAGNOSIS — I10 Essential (primary) hypertension: Secondary | ICD-10-CM

## 2020-02-24 DIAGNOSIS — I255 Ischemic cardiomyopathy: Secondary | ICD-10-CM

## 2020-02-24 DIAGNOSIS — E785 Hyperlipidemia, unspecified: Secondary | ICD-10-CM

## 2020-02-24 DIAGNOSIS — Z5111 Encounter for antineoplastic chemotherapy: Secondary | ICD-10-CM | POA: Diagnosis not present

## 2020-02-24 LAB — CMP (CANCER CENTER ONLY)
ALT: 13 U/L (ref 0–44)
AST: 16 U/L (ref 15–41)
Albumin: 3.9 g/dL (ref 3.5–5.0)
Alkaline Phosphatase: 49 U/L (ref 38–126)
Anion gap: 7 (ref 5–15)
BUN: 21 mg/dL (ref 8–23)
CO2: 29 mmol/L (ref 22–32)
Calcium: 9.5 mg/dL (ref 8.9–10.3)
Chloride: 107 mmol/L (ref 98–111)
Creatinine: 1.03 mg/dL (ref 0.61–1.24)
GFR, Estimated: >60 mL/min (ref >60)
Glucose, Bld: 131 mg/dL — ABNORMAL HIGH (ref 70–99)
Potassium: 3.7 mmol/L (ref 3.5–5.1)
Sodium: 143 mmol/L (ref 135–145)
Total Bilirubin: 1.3 mg/dL — ABNORMAL HIGH (ref 0.3–1.2)
Total Protein: 6.2 g/dL — ABNORMAL LOW (ref 6.5–8.1)

## 2020-02-24 LAB — CBC WITH DIFFERENTIAL (CANCER CENTER ONLY)
Abs Immature Granulocytes: 0.07 10*3/uL (ref 0.00–0.07)
Basophils Absolute: 0 10*3/uL (ref 0.0–0.1)
Basophils Relative: 1 %
Eosinophils Absolute: 0.1 10*3/uL (ref 0.0–0.5)
Eosinophils Relative: 1 %
HCT: 34 % — ABNORMAL LOW (ref 39.0–52.0)
Hemoglobin: 11.6 g/dL — ABNORMAL LOW (ref 13.0–17.0)
Immature Granulocytes: 1 %
Lymphocytes Relative: 20 %
Lymphs Abs: 1.3 10*3/uL (ref 0.7–4.0)
MCH: 32.4 pg (ref 26.0–34.0)
MCHC: 34.1 g/dL (ref 30.0–36.0)
MCV: 95 fL (ref 80.0–100.0)
Monocytes Absolute: 0.9 10*3/uL (ref 0.1–1.0)
Monocytes Relative: 15 %
Neutro Abs: 3.9 10*3/uL (ref 1.7–7.7)
Neutrophils Relative %: 62 %
Platelet Count: 175 10*3/uL (ref 150–400)
RBC: 3.58 MIL/uL — ABNORMAL LOW (ref 4.22–5.81)
RDW: 17.4 % — ABNORMAL HIGH (ref 11.5–15.5)
WBC Count: 6.2 10*3/uL (ref 4.0–10.5)
nRBC: 0 % (ref 0.0–0.2)

## 2020-02-24 LAB — CEA (IN HOUSE-CHCC): CEA (CHCC-In House): 1.89 ng/mL (ref 0.00–5.00)

## 2020-02-24 LAB — LACTATE DEHYDROGENASE: LDH: 264 U/L — ABNORMAL HIGH (ref 98–192)

## 2020-02-24 MED ORDER — SODIUM CHLORIDE 0.9 % IV SOLN
Freq: Once | INTRAVENOUS | Status: DC
  Filled 2020-02-24: qty 250

## 2020-02-24 MED ORDER — ASPIRIN EC 81 MG PO TBEC: 81.0000 mg | DELAYED_RELEASE_TABLET | Freq: Every day | ORAL | 3 refills | Status: DC

## 2020-02-24 MED ORDER — DEXTROSE 5 % IV SOLN
Freq: Once | INTRAVENOUS | Status: AC
  Filled 2020-02-24: qty 250

## 2020-02-24 MED ORDER — FLUOROURACIL CHEMO INJECTION 2.5 GM/50ML
400.0000 mg/m2 | Freq: Once | INTRAVENOUS | Status: AC
  Administered 2020-02-24: 850 mg via INTRAVENOUS
  Filled 2020-02-24: qty 17

## 2020-02-24 MED ORDER — SODIUM CHLORIDE 0.9 % IV SOLN
1920.0000 mg/m2 | INTRAVENOUS | Status: DC
  Administered 2020-02-24: 4150 mg via INTRAVENOUS
  Filled 2020-02-24: qty 83

## 2020-02-24 MED ORDER — LEUCOVORIN CALCIUM INJECTION 350 MG
400.0000 mg/m2 | Freq: Once | INTRAVENOUS | Status: AC
  Administered 2020-02-24: 864 mg via INTRAVENOUS
  Filled 2020-02-24: qty 43.2

## 2020-02-24 MED ORDER — PALONOSETRON HCL INJECTION 0.25 MG/5ML
INTRAVENOUS | Status: AC
  Filled 2020-02-24: qty 5

## 2020-02-24 MED ORDER — SODIUM CHLORIDE 0.9% FLUSH
10.0000 mL | INTRAVENOUS | Status: DC | PRN
  Filled 2020-02-24: qty 10

## 2020-02-24 MED ORDER — SODIUM CHLORIDE 0.9 % IV SOLN
10.0000 mg | Freq: Once | INTRAVENOUS | Status: AC
  Administered 2020-02-24: 10 mg via INTRAVENOUS
  Filled 2020-02-24: qty 10

## 2020-02-24 MED ORDER — OXALIPLATIN CHEMO INJECTION 100 MG/20ML
85.0000 mg/m2 | Freq: Once | INTRAVENOUS | Status: AC
  Administered 2020-02-24: 185 mg via INTRAVENOUS
  Filled 2020-02-24: qty 37

## 2020-02-24 MED ORDER — HEPARIN SOD (PORK) LOCK FLUSH 100 UNIT/ML IV SOLN
500.0000 [IU] | Freq: Once | INTRAVENOUS | Status: DC | PRN
  Filled 2020-02-24: qty 5

## 2020-02-24 MED ORDER — PALONOSETRON HCL INJECTION 0.25 MG/5ML
0.2500 mg | Freq: Once | INTRAVENOUS | Status: AC
  Administered 2020-02-24: 0.25 mg via INTRAVENOUS

## 2020-02-24 NOTE — Progress Notes (Signed)
Oncology Nurse Navigator Documentation  Oncology Nurse Navigator Flowsheets 02/24/2020  Abnormal Finding Date -  Confirmed Diagnosis Date -  Planned Course of Treatment -  Phase of Treatment -  Chemotherapy Actual Start Date: -  Navigator Follow Up Date: 03/08/2020  Navigator Follow Up Reason: Follow-up Appointment;Chemotherapy  Production assistant, radio Encounter Type Treatment  Treatment Initiated Date -  Patient Visit Type MedOnc  Treatment Phase Active Tx  Barriers/Navigation Needs Coordination of Care;Education  Education -  Interventions Psycho-Social Support  Acuity Level 1-No Barriers  Referrals -  Coordination of Care -  Education Method -  Support Groups/Services Friends and Family  Time Spent with Patient 15

## 2020-02-24 NOTE — Patient Instructions (Signed)

## 2020-02-24 NOTE — Progress Notes (Signed)
Patient stable at discharge.

## 2020-02-24 NOTE — Progress Notes (Signed)
Hematology and Oncology Follow Up Visit  John Douglas [id number] [birth year] 64 y.o. 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 daily  Aspirin 81 mg p.o. daily  FOLFOX --  S/p cycle #6-- started on 11/17/2019     Interim History:  John Douglas is in for follow-up.  He seems to be doing pretty well.  He has tolerated treatment quite nicely.  Thankfully, he has not had any problems with respect to the CVA.  He still has not started Eliquis.  He wants to see the cardiologist first.  We did do a CT scan on him today.  This really showed no obvious growth.  He had no obvious disease outside the proximal rectum.  There was a mass in the proximal rectum that measured 2.8 x 7.4 x 2.6 cm.  He has some diverticuli.  He has some borderline enlarged mesorectal lymph nodes.  There is enlarged left common iliac lymph node measuring 1.1 cm.  I really think that we should try to see if he cannot have surgery for this primary site.  I know that when he was in the hospital and found to initially have his cancer, it was felt that he would not be a surgical candidate.  I forgot to mention that his CEA has continued to improve.  Today, his CEA is down to 1.89.  He has had no cough or shortness of breath.  He has had no problems with neuropathy.  He has been fairly active.  He is eating well.  There is been no bleeding.  He is on Plavix and aspirin.  Overall, I would say his performance status is probably ECOG 1.     Medications:  Current Outpatient Medications:  .  acetaminophen (TYLENOL) 500 MG tablet, Take 500 mg by mouth every 6 (six) hours as needed for fever. , Disp: , Rfl:  .  clopidogrel (PLAVIX) 75 MG tablet, Take 1 tablet (75 mg total) by mouth daily., Disp: 30 tablet, Rfl: 0 .  diltiazem (CARDIZEM CD) 180 MG 24 hr  capsule, TAKE 1 CAPSULE BY MOUTH EVERY DAY, Disp: 30 capsule, Rfl: 0 .  ezetimibe (ZETIA) 10 MG tablet, Take 10 mg by mouth at bedtime., Disp: , Rfl:  .  gabapentin (NEURONTIN) 100 MG capsule, TAKE 1 CAPSULE BY MOUTH AT BEDTIME. (Patient taking differently: Take 100 mg by mouth at bedtime. ), Disp: 30 capsule, Rfl: 3 .  glimepiride (AMARYL) 2 MG tablet, Take 1 tablet (2 mg total) by mouth daily with breakfast., Disp: 30 tablet, Rfl: 0 .  glucose blood test strip, Monitor BS before meals and at bedtime for a couple of weeks. Then may decrease to bid before meals, Disp: 100 each, Rfl: 12 .  Lancets (ONETOUCH ULTRASOFT) lancets, Use as instructed, Disp: 100 each, Rfl: 12 .  metoprolol succinate (TOPROL-XL) 100 MG 24 hr tablet, TAKE 1 TABLET BY MOUTH TWICE A DAY IN MORNING AND BEDTIME, Disp: 180 tablet, Rfl: 0 .  nitroGLYCERIN (NITROSTAT) 0.4 MG SL tablet, Place 0.4 mg under the tongue every 5 (five) minutes as needed for chest pain. , Disp: , Rfl:  .  sertraline (ZOLOFT) 50 MG tablet, Take 50 mg by mouth daily., Disp: , Rfl:  .  apixaban (ELIQUIS) 5 MG TABS tablet, Take 1 tablet (5 mg total) by mouth 2 (two) times daily. (  Patient not taking: Reported on 02/24/2020), Disp: 60 tablet, Rfl: 5 .  clonazePAM (KLONOPIN) 1 MG disintegrating tablet, Take 1 tablet (1 mg total) by mouth 2 (two) times daily as needed. (Patient not taking: Reported on 02/24/2020), Disp: 30 tablet, Rfl: 0 .  fluticasone (FLONASE) 50 MCG/ACT nasal spray, Place 1-2 sprays into both nostrils as needed (for seasonal allergies).  (Patient not taking: Reported on 02/24/2020), Disp: , Rfl:  .  lisinopril (ZESTRIL) 2.5 MG tablet, Take 2.5 mg by mouth daily. (Patient not taking: Reported on 02/24/2020), Disp: , Rfl:  .  ondansetron (ZOFRAN) 8 MG tablet, Take 1 tablet (8 mg total) by mouth 2 (two) times daily as needed for refractory nausea / vomiting. Start on day 3 after chemotherapy. (Patient not taking: Reported on 02/24/2020), Disp: 30  tablet, Rfl: 1 .  prochlorperazine (COMPAZINE) 10 MG tablet, Take 1 tablet (10 mg total) by mouth every 6 (six) hours as needed (Nausea or vomiting). (Patient not taking: Reported on 02/24/2020), Disp: 30 tablet, Rfl: 1 .  rosuvastatin (CRESTOR) 20 MG tablet, Take 1 tablet (20 mg total) by mouth daily. (Patient taking differently: Take 20 mg by mouth at bedtime. ), Disp: 30 tablet, Rfl: 0 .  traZODone (DESYREL) 50 MG tablet, TAKE 1 TABLET (50 MG TOTAL) BY MOUTH AT BEDTIME AS NEEDED FOR SLEEP. (Patient not taking: Reported on 02/24/2020), Disp: 30 tablet, Rfl: 3  Allergies:  Allergies  Allergen Reactions  . Celexa [Citalopram] Other (See Comments)    Contraindicated with A-Fib  . Ibuprofen Other (See Comments)    Was told to not take this   . Metformin And Related Other (See Comments)    Bloody stools   . Naproxen Other (See Comments)    Was told to not take this   . Yellow Jacket Venom [Bee Venom] Swelling and Other (See Comments)    Severe swelling where stung  . Penicillins Hives    Did it involve swelling of the face/tongue/throat, SOB, or low BP? Unk Did it involve sudden or severe rash/hives, skin peeling, or any reaction on the inside of your mouth or nose? Yes Did you need to seek medical attention at a hospital or doctor's office? Unk When did it last happen?"Childhood- 55 years ago" If all above answers are "NO", may proceed with cephalosporin use.     Past Medical History, Surgical history, Social history, and Family History were reviewed and updated.  Review of Systems: Review of Systems  Constitutional: Negative.   HENT:  Negative.   Eyes: Negative.   Respiratory: Negative.   Cardiovascular: Positive for palpitations.  Gastrointestinal: Positive for blood in stool.  Endocrine: Negative.   Genitourinary: Negative.    Skin: Negative.   Neurological: Positive for extremity weakness.  Hematological: Negative.   Psychiatric/Behavioral: Negative.     Physical  Exam:  weight is 214 lb 0.6 oz (97.1 kg). His oral temperature is 98.6 F (37 C). His blood pressure is 157/84 (abnormal) and his pulse is 80. His respiration is 20 and oxygen saturation is 100%.   Wt Readings from Last 3 Encounters:  02/24/20 214 lb 0.6 oz (97.1 kg)  02/02/20 214 lb (97.1 kg)  01/19/20 206 lb (93.4 kg)    Physical Exam Vitals reviewed.  HENT:     Head: Normocephalic and atraumatic.  Eyes:     Pupils: Pupils are equal, round, and reactive to light.  Cardiovascular:     Rate and Rhythm: Normal rate and regular rhythm.  Heart sounds: Normal heart sounds.     Comments: Cardiac exam shows an irregular rate and irregular rhythm consistent with atrial fibrillation.  The rate is fairly well controlled.   There are no murmurs, rubs or bruits. Pulmonary:     Effort: Pulmonary effort is normal.     Breath sounds: Normal breath sounds.  Abdominal:     General: Bowel sounds are normal.     Palpations: Abdomen is soft.     Comments: Abdominal exam is soft.  He has decent bowel sounds.  There is no guarding or rebound tenderness.  There is no fluid wave.  There is no palpable liver or spleen tip.  Musculoskeletal:        General: No tenderness or deformity. Normal range of motion.     Cervical back: Normal range of motion.  Lymphadenopathy:     Cervical: No cervical adenopathy.  Skin:    General: Skin is warm and dry.     Findings: No erythema or rash.  Neurological:     Mental Status: He is alert and oriented to person, place, and time.  Psychiatric:        Behavior: Behavior normal.        Thought Content: Thought content normal.        Judgment: Judgment normal.      Lab Results  Component Value Date   WBC 6.2 02/24/2020   HGB 11.6 (L) 02/24/2020   HCT 34.0 (L) 02/24/2020   MCV 95.0 02/24/2020   PLT 175 02/24/2020     Chemistry      Component Value Date/Time   NA 143 02/24/2020 0815   NA 140 08/28/2019 0940   K 3.7 02/24/2020 0815   CL 107  02/24/2020 0815   CO2 29 02/24/2020 0815   BUN 21 02/24/2020 0815   BUN 21 08/28/2019 0940   CREATININE 1.03 02/24/2020 0815      Component Value Date/Time   CALCIUM 9.5 02/24/2020 0815   ALKPHOS 49 02/24/2020 0815   AST 16 02/24/2020 0815   ALT 13 02/24/2020 0815   BILITOT 1.3 (H) 02/24/2020 0815      Impression and Plan: Mr. Vahle is a very nice 64 year old white male.  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  He had a rectal bleeding.  He subsequently was found to have rectal cancer.  He had adenopathy that was distant to the rectum.  We will proceed with his 7th cycle of chemotherapy.   I will see if we cannot do a PET scan on him.  I think this would really be helpful to see if he does have any disease that would be considered outside of the realm of surgical resection.  I would really like to see about getting the primary tumor out.  I think if he is not considered a surgical candidate, we may consider radiation therapy with chemotherapy for the primary rectal tumor.  I am happy that the CEA has done so well.  He really has a lot of strength.  He has a good attitude.  He has great support at home.  We will plan for the PET scan in about 10 days.  We will plan to get him back in another couple weeks.   Volanda Napoleon, MD 10/27/20219:04 AM

## 2020-02-24 NOTE — Patient Instructions (Signed)
Medication Instructions:  Your physician has recommended you make the following change in your medication:  1- STOP Plavix. 2- START Eliquis 5 mg by mouth two times a day. 3- START Aspirin 81 mg by mouth once a day.  *If you need a refill on your cardiac medications before your next appointment, please call your pharmacy*  Follow-Up: At [facility], you and your health needs are our priority.  As part of our continuing mission to provide you with exceptional heart care, we have created designated Provider Care Teams.  These Care Teams include your primary Cardiologist (physician) and Advanced Practice Providers (APPs -  Physician Assistants and Nurse Practitioners) who all work together to provide you with the care you need, when you need it.  We recommend signing up for the patient portal called "MyChart".  Sign up information is provided on this After Visit Summary.  MyChart is used to connect with patients for Virtual Visits (Telemedicine).  Patients are able to view lab/test results, encounter notes, upcoming appointments, etc.  Non-urgent messages can be sent to your provider as well.   To learn more about what you can do with MyChart, go to [email or web address].    Your next appointment:   1 month(s)  The format for your next appointment:   In Person  Provider:   You may see Nelva Bush, MD or one of the following Advanced Practice Providers on your designated Care Team:    Murray Hodgkins, NP  Christell Faith, PA-C  Marrianne Mood, PA-C  Cadence [place], [place]

## 2020-02-24 NOTE — Progress Notes (Signed)
Follow-up Outpatient Visit Date: 02/24/2020  Primary Care Provider: Leonides Sake, MD [place] [address]  Chief Complaint: Follow-up coronary artery disease and atrial fibrillation  HPI:  Mr. Alexa is a 64 y.o. male with history of coronary artery disease with inferior STEMI and primary PCI to the RCA in [id number] complicated by sustained ventricular tachycardia in the setting of residual left coronary artery disease status post subsequent PCI to the mid LAD (06/2019),hypertension, hyperlipidemia, paroxysmal atrial fibrillation complicated by stroke in 09/2019, ischemic cardiomyopathy, type 2 diabetes mellitus, and recurrent GI bleeding with recent diagnosis of rectal cancer, who presents for follow-up of coronary artery disease and atrial fibrillation. I saw him in late July, at which time Mr. Knoedler was found to be in atrial flutter with elevated ventricular rates. He was started on diltiazem in addition to his standing metoprolol. Anticoagulation was deferred in the setting of prior GI bleeding and recently diagnosed rectal mass. He was seen for follow-up on 11/27/2019, at which time he remained in atrial fibrillation with improved heart rate control. No medication changes were made. He was seen by his oncologist, Dr. Marin Olp, earlier this month and was prescribed apixaban.  Today, Mr. Gibler reports that he is doing fairly well.  He is currently undergoing his seventh round of chemotherapy for rectal cancer.  He overall feels quite well.  He denies chest pain, shortness of breath, palpitations, lightheadedness, and edema.  He continues to be active around his home.  He has not had any significant bleeding.  He remains on clopidogrel monotherapy, having deferred initiation of apixaban until today's visit with me.  Other than some mild residual left leg weakness, he does not have any focal neurologic deficits following his stroke in June.  He has not experienced any new  neurologic deficits.  He reports that his resting heart rates are usually in the 70s to 80s per his smart watch, much better since addition of diltiazem in July.  --------------------------------------------------------------------------------------------------  Past Medical History:  Diagnosis Date  . Allergic rhinitis    [facility]  . Anxiety   . Atrial fibrillation ([place])    [facility]  . Benign essential hypertension    [facility]  . Coronary artery disease    [facility]  . Diabetic neuropathy, type II diabetes mellitus ([place])    [facility]  . Dysrhythmia   . Iron deficiency anemia due to chronic blood loss 11/18/2019  . Mixed hyperlipidemia    [facility]  . Myocardial infarct [facility])    [facility]  . Obesity, unspecified    [facility]  . Rectal bleeding    [facility]  . 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 ([place]) 11/17/2019  . Stroke ([place])   . Wide-complex tachycardia ([place]) 06/04/2019   Past Surgical History:  Procedure Laterality Date  . BIOPSY  10/07/2019   Procedure: BIOPSY;  Surgeon: Lavena Bullion, DO;  Location: [facility];  Service: Gastroenterology;;  . COLONOSCOPY WITH PROPOFOL N/A 10/07/2019   Procedure: COLONOSCOPY WITH PROPOFOL;  Surgeon: Lavena Bullion, DO;  Location: [facility];  Service: Gastroenterology;  Laterality: N/A;  . CORONARY ANGIOPLASTY     3 stents  . IR IMAGING GUIDED PORT INSERTION  11/24/2019  . LEFT HEART CATH AND CORONARY ANGIOGRAPHY N/A 06/05/2019   Procedure: LEFT HEART CATH AND CORONARY ANGIOGRAPHY;  Surgeon: Nelva Bush, MD;  Location: [place] [facility];  Service: Cardiovascular;  Laterality: N/A;  . POLYPECTOMY  10/07/2019   Procedure: POLYPECTOMY;  Surgeon: Lavena Bullion, DO;  Location: [facility];  Service:  Gastroenterology;;  . Lia Foyer TATTOO INJECTION  10/07/2019   Procedure: SUBMUCOSAL TATTOO INJECTION;  Surgeon: Lavena Bullion, DO;  Location: [facility];  Service: Gastroenterology;;    Current Meds  Medication Sig  . acetaminophen (TYLENOL) 500 MG tablet Take 500 mg by mouth every 6 (six) hours as needed for fever.   . clopidogrel (PLAVIX) 75 MG tablet Take 1 tablet (75 mg total) by mouth daily.  Marland Kitchen diltiazem (CARDIZEM CD) 180 MG 24 hr capsule TAKE 1 CAPSULE BY MOUTH EVERY DAY  . ezetimibe (ZETIA) 10 MG tablet Take 10 mg by mouth at bedtime.  . gabapentin (NEURONTIN) 100 MG capsule TAKE 1 CAPSULE BY MOUTH AT BEDTIME. (Patient taking differently: Take 100 mg by mouth at bedtime. )  . glimepiride (AMARYL) 2 MG tablet Take 1 tablet (2 mg total) by mouth daily with breakfast.  . glucose blood test strip Monitor BS before meals and at bedtime for a couple of weeks. Then may decrease to bid before meals  . Lancets (ONETOUCH ULTRASOFT) lancets Use as instructed  . metoprolol succinate (TOPROL-XL) 100 MG 24 hr tablet TAKE 1 TABLET BY MOUTH TWICE A DAY IN MORNING AND BEDTIME  . nitroGLYCERIN (NITROSTAT) 0.4 MG SL tablet Place 0.4 mg under the tongue every 5 (five) minutes as needed for chest pain.   Marland Kitchen sertraline (ZOLOFT) 50 MG tablet Take 50 mg by mouth daily.    Allergies: Celexa [citalopram], Ibuprofen, Metformin and related, Naproxen, Yellow jacket venom [bee venom], and Penicillins  Social History   Tobacco Use  . Smoking status: Never Smoker  . Smokeless tobacco: Former Systems developer    Types: Secondary school teacher  . Vaping Use: Never used  Substance Use Topics  . Alcohol use: Not Currently    Comment: occasional  . Drug use: Not Currently    Types: Marijuana    Family History  Problem Relation Age of Onset  . Diverticulitis Mother   . Hypertension Father   . Heart disease Father        MI  . Prostate cancer Father   . Colon cancer Neg Hx   . Stomach cancer Neg Hx   . Pancreatic  cancer Neg Hx   . Rectal cancer Neg Hx     Review of Systems: A 12-system review of systems was performed and was negative except as noted in the HPI.  --------------------------------------------------------------------------------------------------  Physical Exam: BP 140/90 (BP Location: Left Arm, Patient Position: Sitting, Cuff Size: Normal)   Pulse 100   Ht 5\' 10"  (1.778 m)   Wt 216 lb (98 kg)   SpO2 98%   BMI 30.99 kg/m   General: NAD. HEENT: No conjunctival pallor or scleral icterus. Facemask in place. Neck: Supple without lymphadenopathy, thyromegaly, JVD, or HJR. Lungs: Normal work of breathing. Clear to auscultation bilaterally without wheezes or crackles. Heart: Irregularly irregular without murmurs, rubs, or gallops. Non-displaced PMI. Abd: Bowel sounds present. Soft, NT/ND without hepatosplenomegaly Ext: No lower extremity edema.  EKG: Atrial fibrillation (ventricular rate 98 bpm) with poor R wave progression and nonspecific ST/T changes.  Compared with prior tracing from 12/16/2019, ventricular rates have decreased and PVCs versus aberrancy are no longer present.  Lab Results  Component Value Date   WBC 6.2 02/24/2020   HGB 11.6 (L) 02/24/2020   HCT 34.0 (  L) 02/24/2020   MCV 95.0 02/24/2020   PLT 175 02/24/2020    Lab Results  Component Value Date   NA 143 02/24/2020   K 3.7 02/24/2020   CL 107 02/24/2020   CO2 29 02/24/2020   BUN 21 02/24/2020   CREATININE 1.03 02/24/2020   GLUCOSE 131 (H) 02/24/2020   ALT 13 02/24/2020    Lab Results  Component Value Date   CHOL 148 10/09/2019   HDL 26 (L) 10/09/2019   LDLCALC 98 10/09/2019   TRIG 119 10/09/2019   CHOLHDL 5.7 10/09/2019    --------------------------------------------------------------------------------------------------  ASSESSMENT AND PLAN: Persistent atrial fibrillation: Mr. Zwilling remains in atrial fibrillation albeit with reasonable heart rate control today.  Thus far, we have been  reluctant to anticoagulate him in the setting of his rectal cancer with significant GI bleeding in the past.  However, Mr. Winkowski denies any further bleeding and has responded well to chemotherapy.  Additionally, his risk for stent thrombosis continues to decline as he moves farther out from his MI's.  I think it would be reasonable to discontinue clopidogrel and have Mr. Salguero start apixaban 5 mg twice daily and aspirin 81 mg daily in the setting of a CHA2DS2-VASc score of at least 7.  We have discussed the risks and benefits of this, and Mr. Lucarelli is in agreement with this plan.  We will continue current doses of metoprolol and diltiazem for rate control.  We discussed cardioversion once Mr. Radford has completed at least 1 month of therapeutic anticoagulation.  We have agreed that it would likely be best to defer this until he has completed his chemotherapy in order to minimize the risk for recurrence of atrial fibrillation.  Coronary artery disease: Mr. Enrique denies symptoms to suggest recurrent angina.  As he is now about 8 months out from his most recent PCI to the LAD, I think it is reasonable to transition from clopidogrel monotherapy to aspirin and apixaban.  We could even consider dropping the aspirin in the future, though I would like to wait at least 12 months from the time of his most recent stent placement, if he tolerates this from a bleeding standpoint.  Ischemic cardiomyopathy: Mr. Colpitts appears euvolemic and well compensated today.  Most recent echo in June showed normalized LVEF.  Continue current dose of metoprolol.  Consider adding back ACE inhibitor in the future.  Hypertension: Blood pressure mildly elevated today.  However, in the setting of ongoing chemotherapy and potential for drops in blood pressure, we will defer medication changes today.  Hyperlipidemia: Continue rosuvastatin 20 mg daily and ezetimibe 10 mg daily for target LDL less than 70.  Follow-up: Return to clinic  in 1 month.  Nelva Bush, MD 02/24/2020 3:10 PM

## 2020-02-24 NOTE — Patient Instructions (Signed)
Return this Friday for pump D/C.  Nausea med prn.

## 2020-02-25 ENCOUNTER — Encounter: Payer: Self-pay | Admitting: Internal Medicine

## 2020-02-25 DIAGNOSIS — I4819 Other persistent atrial fibrillation: Secondary | ICD-10-CM | POA: Insufficient documentation

## 2020-02-26 ENCOUNTER — Encounter: Payer: Self-pay | Admitting: *Deleted

## 2020-02-26 ENCOUNTER — Inpatient Hospital Stay: Payer: Medicare HMO

## 2020-02-26 ENCOUNTER — Other Ambulatory Visit: Payer: Self-pay

## 2020-02-26 VITALS — BP 120/89 | HR 75 | Temp 98.3°F | Resp 19

## 2020-02-26 DIAGNOSIS — Z5111 Encounter for antineoplastic chemotherapy: Secondary | ICD-10-CM | POA: Diagnosis not present

## 2020-02-26 DIAGNOSIS — C20 Malignant neoplasm of rectum: Secondary | ICD-10-CM

## 2020-02-26 DIAGNOSIS — C775 Secondary and unspecified malignant neoplasm of intrapelvic lymph nodes: Secondary | ICD-10-CM

## 2020-02-26 MED ORDER — HEPARIN SOD (PORK) LOCK FLUSH 100 UNIT/ML IV SOLN
500.0000 [IU] | Freq: Once | INTRAVENOUS | Status: AC | PRN
  Administered 2020-02-26: 500 [IU]
  Filled 2020-02-26: qty 5

## 2020-02-26 MED ORDER — SODIUM CHLORIDE 0.9% FLUSH
10.0000 mL | INTRAVENOUS | Status: DC | PRN
  Administered 2020-02-26: 10 mL
  Filled 2020-02-26: qty 10

## 2020-02-26 NOTE — Patient Instructions (Signed)

## 2020-02-26 NOTE — Progress Notes (Signed)
Patient needs PET scan prior to next cycle planned for 03/08/20. Attempted to schedule PET, but next available isn't until 03/10/20. Scheduled for that day, and spoke to Dr Marin Olp who would like next treatment pushed out one week, after PET scan.  Message send to scheduler. He is here for pump dc today. Will review calendar and radiology prep sheet with him at that time.   Oncology Nurse Navigator Documentation  Oncology Nurse Navigator Flowsheets 02/26/2020  Abnormal Finding Date -  Confirmed Diagnosis Date -  Planned Course of Treatment -  Phase of Treatment -  Chemotherapy Actual Start Date: -  Navigator Follow Up Date: 03/10/2020  Navigator Follow Up Reason: Scan Review  Navigator Location [facility]  Navigator Encounter Type Appt/Treatment Plan Review  Treatment Initiated Date -  Patient Visit Type MedOnc  Treatment Phase Active Tx  Barriers/Navigation Needs Coordination of Care;Education  Education Other  Interventions Coordination of Care;Education  Acuity Level 1-No Barriers  Referrals -  Coordination of Care Appts;Radiology  Education Method Verbal;Written  Support Groups/Services Friends and Family  Time Spent with Patient 63

## 2020-03-08 ENCOUNTER — Inpatient Hospital Stay: Payer: Medicare HMO

## 2020-03-08 ENCOUNTER — Inpatient Hospital Stay: Payer: Medicare HMO | Admitting: Hematology & Oncology

## 2020-03-08 ENCOUNTER — Other Ambulatory Visit: Payer: Self-pay | Admitting: Internal Medicine

## 2020-03-08 DIAGNOSIS — I4892 Unspecified atrial flutter: Secondary | ICD-10-CM

## 2020-03-08 DIAGNOSIS — I4891 Unspecified atrial fibrillation: Secondary | ICD-10-CM

## 2020-03-11 ENCOUNTER — Ambulatory Visit (HOSPITAL_COMMUNITY)
Admission: RE | Admit: 2020-03-11 | Discharge: 2020-03-11 | Disposition: A | Discharge status: Home / Self Care | Payer: Medicare HMO | Source: Ambulatory Visit | Attending: Hematology & Oncology | Admitting: Hematology & Oncology

## 2020-03-11 ENCOUNTER — Other Ambulatory Visit: Payer: Self-pay

## 2020-03-11 ENCOUNTER — Encounter: Payer: Self-pay | Admitting: *Deleted

## 2020-03-11 DIAGNOSIS — M5136 Other intervertebral disc degeneration, lumbar region: Secondary | ICD-10-CM | POA: Insufficient documentation

## 2020-03-11 DIAGNOSIS — Z8673 Personal history of transient ischemic attack (TIA), and cerebral infarction without residual deficits: Secondary | ICD-10-CM | POA: Insufficient documentation

## 2020-03-11 DIAGNOSIS — C775 Secondary and unspecified malignant neoplasm of intrapelvic lymph nodes: Secondary | ICD-10-CM | POA: Diagnosis present

## 2020-03-11 DIAGNOSIS — J90 Pleural effusion, not elsewhere classified: Secondary | ICD-10-CM | POA: Insufficient documentation

## 2020-03-11 DIAGNOSIS — I2510 Atherosclerotic heart disease of native coronary artery without angina pectoris: Secondary | ICD-10-CM | POA: Insufficient documentation

## 2020-03-11 DIAGNOSIS — K5730 Diverticulosis of large intestine without perforation or abscess without bleeding: Secondary | ICD-10-CM | POA: Insufficient documentation

## 2020-03-11 DIAGNOSIS — N200 Calculus of kidney: Secondary | ICD-10-CM | POA: Diagnosis not present

## 2020-03-11 DIAGNOSIS — I700 Atherosclerosis of aorta: Secondary | ICD-10-CM | POA: Insufficient documentation

## 2020-03-11 DIAGNOSIS — M47816 Spondylosis without myelopathy or radiculopathy, lumbar region: Secondary | ICD-10-CM | POA: Diagnosis not present

## 2020-03-11 DIAGNOSIS — C20 Malignant neoplasm of rectum: Secondary | ICD-10-CM | POA: Insufficient documentation

## 2020-03-11 LAB — GLUCOSE, CAPILLARY: Glucose-Capillary: 132 mg/dL — ABNORMAL HIGH (ref 70–99)

## 2020-03-11 MED ORDER — FLUDEOXYGLUCOSE F - 18 (FDG) INJECTION
10.7800 | Freq: Once | INTRAVENOUS | Status: AC | PRN
  Administered 2020-03-11: 10.78 via INTRAVENOUS

## 2020-03-11 NOTE — Progress Notes (Signed)
Oncology Nurse Navigator Documentation  Oncology Nurse Navigator Flowsheets 03/11/2020  Abnormal Finding Date -  Confirmed Diagnosis Date -  Planned Course of Treatment -  Phase of Treatment -  Chemotherapy Actual Start Date: -  Navigator Follow Up Date: 03/14/2020  Navigator Follow Up Reason: Follow-up Appointment  Navigator Location [facility]  Navigator Encounter Type Scan Review  Treatment Initiated Date -  Patient Visit Type MedOnc  Treatment Phase Active Tx  Barriers/Navigation Needs -  Education -  Interventions -  Acuity -  Referrals -  Coordination of Care -  Education Method -  Support Groups/Services -  Time Spent with Patient 15

## 2020-03-12 ENCOUNTER — Other Ambulatory Visit: Payer: Self-pay | Admitting: Internal Medicine

## 2020-03-14 ENCOUNTER — Inpatient Hospital Stay: Payer: Medicare HMO | Attending: Hematology & Oncology

## 2020-03-14 ENCOUNTER — Inpatient Hospital Stay (HOSPITAL_BASED_OUTPATIENT_CLINIC_OR_DEPARTMENT_OTHER): Payer: Medicare HMO | Admitting: Hematology & Oncology

## 2020-03-14 ENCOUNTER — Other Ambulatory Visit: Payer: Self-pay

## 2020-03-14 ENCOUNTER — Inpatient Hospital Stay: Payer: Medicare HMO

## 2020-03-14 ENCOUNTER — Telehealth: Payer: Self-pay

## 2020-03-14 ENCOUNTER — Encounter: Payer: Self-pay | Admitting: Hematology & Oncology

## 2020-03-14 DIAGNOSIS — C20 Malignant neoplasm of rectum: Secondary | ICD-10-CM | POA: Insufficient documentation

## 2020-03-14 DIAGNOSIS — Z5111 Encounter for antineoplastic chemotherapy: Secondary | ICD-10-CM | POA: Insufficient documentation

## 2020-03-14 DIAGNOSIS — Z8673 Personal history of transient ischemic attack (TIA), and cerebral infarction without residual deficits: Secondary | ICD-10-CM | POA: Insufficient documentation

## 2020-03-14 DIAGNOSIS — C775 Secondary and unspecified malignant neoplasm of intrapelvic lymph nodes: Secondary | ICD-10-CM

## 2020-03-14 DIAGNOSIS — Z452 Encounter for adjustment and management of vascular access device: Secondary | ICD-10-CM | POA: Insufficient documentation

## 2020-03-14 DIAGNOSIS — I4891 Unspecified atrial fibrillation: Secondary | ICD-10-CM | POA: Diagnosis not present

## 2020-03-14 LAB — CMP (CANCER CENTER ONLY)
ALT: 10 U/L (ref 0–44)
AST: 14 U/L — ABNORMAL LOW (ref 15–41)
Albumin: 4 g/dL (ref 3.5–5.0)
Alkaline Phosphatase: 53 U/L (ref 38–126)
Anion gap: 7 (ref 5–15)
BUN: 21 mg/dL (ref 8–23)
CO2: 27 mmol/L (ref 22–32)
Calcium: 9.1 mg/dL (ref 8.9–10.3)
Chloride: 107 mmol/L (ref 98–111)
Creatinine: 0.96 mg/dL (ref 0.61–1.24)
GFR, Estimated: >60 mL/min (ref >60)
Glucose, Bld: 174 mg/dL — ABNORMAL HIGH (ref 70–99)
Potassium: 3.6 mmol/L (ref 3.5–5.1)
Sodium: 141 mmol/L (ref 135–145)
Total Bilirubin: 1.3 mg/dL — ABNORMAL HIGH (ref 0.3–1.2)
Total Protein: 5.9 g/dL — ABNORMAL LOW (ref 6.5–8.1)

## 2020-03-14 LAB — CBC WITH DIFFERENTIAL (CANCER CENTER ONLY)
Abs Immature Granulocytes: 0.06 10*3/uL (ref 0.00–0.07)
Basophils Absolute: 0 10*3/uL (ref 0.0–0.1)
Basophils Relative: 1 %
Eosinophils Absolute: 0.2 10*3/uL (ref 0.0–0.5)
Eosinophils Relative: 3 %
HCT: 33 % — ABNORMAL LOW (ref 39.0–52.0)
Hemoglobin: 11.2 g/dL — ABNORMAL LOW (ref 13.0–17.0)
Immature Granulocytes: 1 %
Lymphocytes Relative: 30 %
Lymphs Abs: 1.5 10*3/uL (ref 0.7–4.0)
MCH: 32.5 pg (ref 26.0–34.0)
MCHC: 33.9 g/dL (ref 30.0–36.0)
MCV: 95.7 fL (ref 80.0–100.0)
Monocytes Absolute: 0.8 10*3/uL (ref 0.1–1.0)
Monocytes Relative: 16 %
Neutro Abs: 2.4 10*3/uL (ref 1.7–7.7)
Neutrophils Relative %: 49 %
Platelet Count: 199 10*3/uL (ref 150–400)
RBC: 3.45 MIL/uL — ABNORMAL LOW (ref 4.22–5.81)
RDW: 17 % — ABNORMAL HIGH (ref 11.5–15.5)
WBC Count: 4.9 10*3/uL (ref 4.0–10.5)
nRBC: 0 % (ref 0.0–0.2)

## 2020-03-14 LAB — CEA (IN HOUSE-CHCC): CEA (CHCC-In House): 2 ng/mL (ref 0.00–5.00)

## 2020-03-14 MED ORDER — LEUCOVORIN CALCIUM INJECTION 350 MG
400.0000 mg/m2 | Freq: Once | INTRAVENOUS | Status: AC
  Administered 2020-03-14: 864 mg via INTRAVENOUS
  Filled 2020-03-14: qty 43.2

## 2020-03-14 MED ORDER — PALONOSETRON HCL INJECTION 0.25 MG/5ML
0.2500 mg | Freq: Once | INTRAVENOUS | Status: AC
  Administered 2020-03-14: 0.25 mg via INTRAVENOUS

## 2020-03-14 MED ORDER — SODIUM CHLORIDE 0.9 % IV SOLN
10.0000 mg | Freq: Once | INTRAVENOUS | Status: AC
  Administered 2020-03-14: 10 mg via INTRAVENOUS
  Filled 2020-03-14: qty 10

## 2020-03-14 MED ORDER — DEXTROSE 5 % IV SOLN
Freq: Once | INTRAVENOUS | Status: AC
  Filled 2020-03-14: qty 250

## 2020-03-14 MED ORDER — FLUOROURACIL CHEMO INJECTION 2.5 GM/50ML
400.0000 mg/m2 | Freq: Once | INTRAVENOUS | Status: AC
  Administered 2020-03-14: 850 mg via INTRAVENOUS
  Filled 2020-03-14: qty 17

## 2020-03-14 MED ORDER — OXALIPLATIN CHEMO INJECTION 100 MG/20ML
85.0000 mg/m2 | Freq: Once | INTRAVENOUS | Status: AC
  Administered 2020-03-14: 185 mg via INTRAVENOUS
  Filled 2020-03-14: qty 37

## 2020-03-14 MED ORDER — PALONOSETRON HCL INJECTION 0.25 MG/5ML
INTRAVENOUS | Status: AC
  Filled 2020-03-14: qty 5

## 2020-03-14 MED ORDER — SODIUM CHLORIDE 0.9 % IV SOLN
1920.0000 mg/m2 | INTRAVENOUS | Status: DC
  Administered 2020-03-14: 4150 mg via INTRAVENOUS
  Filled 2020-03-14: qty 83

## 2020-03-14 NOTE — Patient Instructions (Signed)
Fluorouracil, 5-FU injection What is this medicine? FLUOROURACIL, 5-FU (flure oh YOOR a sil) is a chemotherapy drug. It slows the growth of cancer cells. This medicine is used to treat many types of cancer like breast cancer, colon or rectal cancer, pancreatic cancer, and stomach cancer. This medicine may be used for other purposes; ask your health care provider or pharmacist if you have questions. COMMON BRAND NAME(S): Adrucil What should I tell my health care provider before I take this medicine? They need to know if you have any of these conditions:  blood disorders  dihydropyrimidine dehydrogenase (DPD) deficiency  infection (especially a virus infection such as chickenpox, cold sores, or herpes)  kidney disease  liver disease  malnourished, poor nutrition  recent or ongoing radiation therapy  an unusual or allergic reaction to fluorouracil, other chemotherapy, other medicines, foods, dyes, or preservatives  pregnant or trying to get pregnant  breast-feeding How should I use this medicine? This drug is given as an infusion or injection into a vein. It is administered in a hospital or clinic by a specially trained health care professional. Talk to your pediatrician regarding the use of this medicine in children. Special care may be needed. Overdosage: If you think you have taken too much of this medicine contact a poison control center or emergency room at once. NOTE: This medicine is only for you. Do not share this medicine with others. What if I miss a dose? It is important not to miss your dose. Call your doctor or health care professional if you are unable to keep an appointment. What may interact with this medicine?  allopurinol  cimetidine  dapsone  digoxin  hydroxyurea  leucovorin  levamisole  medicines for seizures like ethotoin, fosphenytoin, phenytoin  medicines to increase blood counts like filgrastim, pegfilgrastim, sargramostim  medicines that  treat or prevent blood clots like warfarin, enoxaparin, and dalteparin  methotrexate  metronidazole  pyrimethamine  some other chemotherapy drugs like busulfan, cisplatin, estramustine, vinblastine  trimethoprim  trimetrexate  vaccines Talk to your doctor or health care professional before taking any of these medicines:  acetaminophen  aspirin  ibuprofen  ketoprofen  naproxen This list may not describe all possible interactions. Give your health care provider a list of all the medicines, herbs, non-prescription drugs, or dietary supplements you use. Also tell them if you smoke, drink alcohol, or use illegal drugs. Some items may interact with your medicine. What should I watch for while using this medicine? Visit your doctor for checks on your progress. This drug may make you feel generally unwell. This is not uncommon, as chemotherapy can affect healthy cells as well as cancer cells. Report any side effects. Continue your course of treatment even though you feel ill unless your doctor tells you to stop. In some cases, you may be given additional medicines to help with side effects. Follow all directions for their use. Call your doctor or health care professional for advice if you get a fever, chills or sore throat, or other symptoms of a cold or flu. Do not treat yourself. This drug decreases your body's ability to fight infections. Try to avoid being around people who are sick. This medicine may increase your risk to bruise or bleed. Call your doctor or health care professional if you notice any unusual bleeding. Be careful brushing and flossing your teeth or using a toothpick because you may get an infection or bleed more easily. If you have any dental work done, tell your dentist you are  receiving this medicine. Avoid taking products that contain aspirin, acetaminophen, ibuprofen, naproxen, or ketoprofen unless instructed by your doctor. These medicines may hide a fever. Do not  become pregnant while taking this medicine. Women should inform their doctor if they wish to become pregnant or think they might be pregnant. There is a potential for serious side effects to an unborn child. Talk to your health care professional or pharmacist for more information. Do not breast-feed an infant while taking this medicine. Men should inform their doctor if they wish to father a child. This medicine may lower sperm counts. Do not treat diarrhea with over the counter products. Contact your doctor if you have diarrhea that lasts more than 2 days or if it is severe and watery. This medicine can make you more sensitive to the sun. Keep out of the sun. If you cannot avoid being in the sun, wear protective clothing and use sunscreen. Do not use sun lamps or tanning beds/booths. What side effects may I notice from receiving this medicine? Side effects that you should report to your doctor or health care professional as soon as possible:  allergic reactions like skin rash, itching or hives, swelling of the face, lips, or tongue  low blood counts - this medicine may decrease the number of white blood cells, red blood cells and platelets. You may be at increased risk for infections and bleeding.  signs of infection - fever or chills, cough, sore throat, pain or difficulty passing urine  signs of decreased platelets or bleeding - bruising, pinpoint red spots on the skin, black, tarry stools, blood in the urine  signs of decreased red blood cells - unusually weak or tired, fainting spells, lightheadedness  breathing problems  changes in vision  chest pain  mouth sores  nausea and vomiting  pain, swelling, redness at site where injected  pain, tingling, numbness in the hands or feet  redness, swelling, or sores on hands or feet  stomach pain  unusual bleeding Side effects that usually do not require medical attention (report to your doctor or health care professional if they  continue or are bothersome):  changes in finger or toe nails  diarrhea  dry or itchy skin  hair loss  headache  loss of appetite  sensitivity of eyes to the light  stomach upset  unusually teary eyes This list may not describe all possible side effects. Call your doctor for medical advice about side effects. You may report side effects to FDA at 1-800-FDA-1088. Where should I keep my medicine? This drug is given in a hospital or clinic and will not be stored at home. NOTE: This sheet is a summary. It may not cover all possible information. If you have questions about this medicine, talk to your doctor, pharmacist, or health care provider.  2020 [employer] Standard (2007-08-20 13:53:16) Leucovorin injection What is this medicine? LEUCOVORIN (loo koe VOR in) is used to prevent or treat the harmful effects of some medicines. This medicine is used to treat anemia caused by a low amount of folic acid in the body. It is also used with 5-fluorouracil (5-FU) to treat colon cancer. This medicine may be used for other purposes; ask your health care provider or pharmacist if you have questions. What should I tell my health care provider before I take this medicine? They need to know if you have any of these conditions:  anemia from low levels of vitamin B-12 in the blood  an unusual or allergic reaction to  leucovorin, folic acid, other medicines, foods, dyes, or preservatives  pregnant or trying to get pregnant  breast-feeding How should I use this medicine? This medicine is for injection into a muscle or into a vein. It is given by a health care professional in a hospital or clinic setting. Talk to your pediatrician regarding the use of this medicine in children. Special care may be needed. Overdosage: If you think you have taken too much of this medicine contact a poison control center or emergency room at once. NOTE: This medicine is only for you. Do not share this medicine with  others. What if I miss a dose? This does not apply. What may interact with this medicine?  capecitabine  fluorouracil  phenobarbital  phenytoin  primidone  trimethoprim-sulfamethoxazole This list may not describe all possible interactions. Give your health care provider a list of all the medicines, herbs, non-prescription drugs, or dietary supplements you use. Also tell them if you smoke, drink alcohol, or use illegal drugs. Some items may interact with your medicine. What should I watch for while using this medicine? Your condition will be monitored carefully while you are receiving this medicine. This medicine may increase the side effects of 5-fluorouracil, 5-FU. Tell your doctor or health care professional if you have diarrhea or mouth sores that do not get better or that get worse. What side effects may I notice from receiving this medicine? Side effects that you should report to your doctor or health care professional as soon as possible:  allergic reactions like skin rash, itching or hives, swelling of the face, lips, or tongue  breathing problems  fever, infection  mouth sores  unusual bleeding or bruising  unusually weak or tired Side effects that usually do not require medical attention (report to your doctor or health care professional if they continue or are bothersome):  constipation or diarrhea  loss of appetite  nausea, vomiting This list may not describe all possible side effects. Call your doctor for medical advice about side effects. You may report side effects to FDA at 1-800-FDA-1088. Where should I keep my medicine? This drug is given in a hospital or clinic and will not be stored at home. NOTE: This sheet is a summary. It may not cover all possible information. If you have questions about this medicine, talk to your doctor, pharmacist, or health care provider.  2020 [employer]/Gold Standard (2007-10-21 16:50:29) Oxaliplatin Injection What is this  medicine? OXALIPLATIN (ox AL i PLA tin) is a chemotherapy drug. It targets fast dividing cells, like cancer cells, and causes these cells to die. This medicine is used to treat cancers of the colon and rectum, and many other cancers. This medicine may be used for other purposes; ask your health care provider or pharmacist if you have questions. COMMON BRAND NAME(S): Eloxatin What should I tell my health care provider before I take this medicine? They need to know if you have any of these conditions:  heart disease  history of irregular heartbeat  liver disease  low blood counts, like white cells, platelets, or red blood cells  lung or breathing disease, like asthma  take medicines that treat or prevent blood clots  tingling of the fingers or toes, or other nerve disorder  an unusual or allergic reaction to oxaliplatin, other chemotherapy, other medicines, foods, dyes, or preservatives  pregnant or trying to get pregnant  breast-feeding How should I use this medicine? This drug is given as an infusion into a vein. It is administered  in a hospital or clinic by a specially trained health care professional. Talk to your pediatrician regarding the use of this medicine in children. Special care may be needed. Overdosage: If you think you have taken too much of this medicine contact a poison control center or emergency room at once. NOTE: This medicine is only for you. Do not share this medicine with others. What if I miss a dose? It is important not to miss a dose. Call your doctor or health care professional if you are unable to keep an appointment. What may interact with this medicine? Do not take this medicine with any of the following medications:  cisapride  dronedarone  pimozide  thioridazine This medicine may also interact with the following medications:  aspirin and aspirin-like medicines  certain medicines that treat or prevent blood clots like warfarin, apixaban,  dabigatran, and rivaroxaban  cisplatin  cyclosporine  diuretics  medicines for infection like acyclovir, adefovir, amphotericin B, bacitracin, cidofovir, foscarnet, ganciclovir, gentamicin, pentamidine, vancomycin  NSAIDs, medicines for pain and inflammation, like ibuprofen or naproxen  other medicines that prolong the QT interval (an abnormal heart rhythm)  pamidronate  zoledronic acid This list may not describe all possible interactions. Give your health care provider a list of all the medicines, herbs, non-prescription drugs, or dietary supplements you use. Also tell them if you smoke, drink alcohol, or use illegal drugs. Some items may interact with your medicine. What should I watch for while using this medicine? Your condition will be monitored carefully while you are receiving this medicine. You may need blood work done while you are taking this medicine. This medicine may make you feel generally unwell. This is not uncommon as chemotherapy can affect healthy cells as well as cancer cells. Report any side effects. Continue your course of treatment even though you feel ill unless your healthcare professional tells you to stop. This medicine can make you more sensitive to cold. Do not drink cold drinks or use ice. Cover exposed skin before coming in contact with cold temperatures or cold objects. When out in cold weather wear warm clothing and cover your mouth and nose to warm the air that goes into your lungs. Tell your doctor if you get sensitive to the cold. Do not become pregnant while taking this medicine or for 9 months after stopping it. Women should inform their health care professional if they wish to become pregnant or think they might be pregnant. Men should not father a child while taking this medicine and for 6 months after stopping it. There is potential for serious side effects to an unborn child. Talk to your health care professional for more information. Do not  breast-feed a child while taking this medicine or for 3 months after stopping it. This medicine has caused ovarian failure in some women. This medicine may make it more difficult to get pregnant. Talk to your health care professional if you are concerned about your fertility. This medicine has caused decreased sperm counts in some men. This may make it more difficult to father a child. Talk to your health care professional if you are concerned about your fertility. This medicine may increase your risk of getting an infection. Call your health care professional for advice if you get a fever, chills, or sore throat, or other symptoms of a cold or flu. Do not treat yourself. Try to avoid being around people who are sick. Avoid taking medicines that contain aspirin, acetaminophen, ibuprofen, naproxen, or ketoprofen unless instructed by  your health care professional. These medicines may hide a fever. Be careful brushing or flossing your teeth or using a toothpick because you may get an infection or bleed more easily. If you have any dental work done, tell your dentist you are receiving this medicine. What side effects may I notice from receiving this medicine? Side effects that you should report to your doctor or health care professional as soon as possible:  allergic reactions like skin rash, itching or hives, swelling of the face, lips, or tongue  breathing problems  cough  low blood counts - this medicine may decrease the number of white blood cells, red blood cells, and platelets. You may be at increased risk for infections and bleeding  nausea, vomiting  pain, redness, or irritation at site where injected  pain, tingling, numbness in the hands or feet  signs and symptoms of bleeding such as bloody or black, tarry stools; red or dark brown urine; spitting up blood or brown material that looks like coffee grounds; red spots on the skin; unusual bruising or bleeding from the eyes, gums, or  nose  signs and symptoms of a dangerous change in heartbeat or heart rhythm like chest pain; dizziness; fast, irregular heartbeat; palpitations; feeling faint or lightheaded; falls  signs and symptoms of infection like fever; chills; cough; sore throat; pain or trouble passing urine  signs and symptoms of liver injury like dark yellow or brown urine; general ill feeling or flu-like symptoms; light-colored stools; loss of appetite; nausea; right upper belly pain; unusually weak or tired; yellowing of the eyes or skin  signs and symptoms of low red blood cells or anemia such as unusually weak or tired; feeling faint or lightheaded; falls  signs and symptoms of muscle injury like dark urine; trouble passing urine or change in the amount of urine; unusually weak or tired; muscle pain; back pain Side effects that usually do not require medical attention (report to your doctor or health care professional if they continue or are bothersome):  changes in taste  diarrhea  gas  hair loss  loss of appetite  mouth sores This list may not describe all possible side effects. Call your doctor for medical advice about side effects. You may report side effects to FDA at 1-800-FDA-1088. Where should I keep my medicine? This drug is given in a hospital or clinic and will not be stored at home. NOTE: This sheet is a summary. It may not cover all possible information. If you have questions about this medicine, talk to your doctor, pharmacist, or health care provider.  2020 [employer] Standard (2018-09-03 12:20:35)

## 2020-03-14 NOTE — Patient Instructions (Signed)

## 2020-03-14 NOTE — Telephone Encounter (Signed)
Called pt to maker f/u appt per 11/15 LOS    [facility]

## 2020-03-14 NOTE — Progress Notes (Signed)
Hematology and Oncology Follow Up Visit  John Douglas [id number] January 15, 1956 64 y.o. 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 twice daily   Aspirin 81 mg p.o. daily  FOLFOX --  S/p cycle #7-- started on 11/17/2019     Interim History:  John Douglas is in for follow-up.  His birthday is tomorrow.  John Douglas has already done some celebrating.  I am sure that John Douglas will have a wonderful birthday.  John Douglas now is on Eliquis.  The Plavix has been taken away from him.  John Douglas is doing okay on the Eliquis.  John Douglas did have a PET scan done.  This was done on 12 November.  The PET scan showed adenopathy which appears to have been treated nicely.  John Douglas does have a calcified left common iliac node.  There is an aortocaval lymph node.  A right retrocrural lymph node.  Everything measures 1 cm or less with a minimal SUV.  The problem I think is at the rectosigmoid junction where John Douglas has the primary.  This is still thickened with an SUV of 6.8.  I really think that radiation therapy along with low-dose Xeloda would not be a bad idea to try to help treat the primary.  I talked to John Douglas about this.  I told him that radiation is different now from what his wife had.  John Douglas is worried that his wife has severe complications from radiation to the chest for lung cancer.  I explained to him that there is new radiation techniques.  Where John Douglas is getting the radiation his abdomen, John Douglas really should not have a lot of side effects outside of maybe some diarrhea.  I talked him about the Xeloda.  I explained to him what Xeloda was it is a pill.  John Douglas will not have IV chemotherapy.  John Douglas will take the Xeloda daily through the course of radiation.  John Douglas has had no problems with bleeding.  There is no problems with bowels or bladder.  John Douglas has had no abdominal pain.  John Douglas has  had no chest wall pain.  Is had no leg swelling.  John Douglas has had no rashes.  The stroke that John Douglas had really has resolved with respect to side effects.  There really is very little if any weakness over on the left side.  Currently, his performance status is ECOG 1.  Medications:  Current Outpatient Medications:  .  acetaminophen (TYLENOL) 500 MG tablet, Take 500 mg by mouth every 6 (six) hours as needed for fever. , Disp: , Rfl:  .  apixaban (ELIQUIS) 5 MG TABS tablet, Take 1 tablet (5 mg total) by mouth 2 (two) times daily. (Patient not taking: Reported on 02/24/2020), Disp: 60 tablet, Rfl: 5 .  aspirin EC 81 MG tablet, Take 1 tablet (81 mg total) by mouth daily. Swallow whole., Disp: 90 tablet, Rfl: 3 .  diltiazem (CARDIZEM CD) 180 MG 24 hr capsule, TAKE 1 CAPSULE BY MOUTH EVERY DAY, Disp: 30 capsule, Rfl: 0 .  ezetimibe (ZETIA) 10 MG tablet, TAKE 1 TABLET BY MOUTH EVERY DAY, Disp: 90 tablet, Rfl: 0 .  gabapentin (NEURONTIN) 100 MG capsule, TAKE 1 CAPSULE BY MOUTH AT BEDTIME. (Patient taking differently: Take 100 mg by mouth at bedtime. ), Disp: 30 capsule, Rfl: 3 .  glimepiride (AMARYL) 2 MG tablet, Take 1 tablet (  2 mg total) by mouth daily with breakfast., Disp: 30 tablet, Rfl: 0 .  glucose blood test strip, Monitor BS before meals and at bedtime for a couple of weeks. Then may decrease to bid before meals, Disp: 100 each, Rfl: 12 .  Lancets (ONETOUCH ULTRASOFT) lancets, Use as instructed, Disp: 100 each, Rfl: 12 .  metoprolol succinate (TOPROL-XL) 100 MG 24 hr tablet, TAKE 1 TABLET BY MOUTH TWICE A DAY IN MORNING AND BEDTIME, Disp: 180 tablet, Rfl: 0 .  nitroGLYCERIN (NITROSTAT) 0.4 MG SL tablet, Place 0.4 mg under the tongue every 5 (five) minutes as needed for chest pain. , Disp: , Rfl:  .  rosuvastatin (CRESTOR) 20 MG tablet, Take 1 tablet (20 mg total) by mouth daily. (Patient taking differently: Take 20 mg by mouth at bedtime. ), Disp: 30 tablet, Rfl: 0 .  sertraline (ZOLOFT) 50 MG tablet,  Take 50 mg by mouth daily., Disp: , Rfl:   Allergies:  Allergies  Allergen Reactions  . Celexa [Citalopram] Other (See Comments)    Contraindicated with A-Fib  . Ibuprofen Other (See Comments)    Was told to not take this   . Metformin And Related Other (See Comments)    Bloody stools   . Naproxen Other (See Comments)    Was told to not take this   . Yellow Jacket Venom [Bee Venom] Swelling and Other (See Comments)    Severe swelling where stung  . Penicillins Hives    Did it involve swelling of the face/tongue/throat, SOB, or low BP? Unk Did it involve sudden or severe rash/hives, skin peeling, or any reaction on the inside of your mouth or nose? Yes Did you need to seek medical attention at a hospital or doctor's office? Unk When did it last happen?"Childhood- 55 years ago" If all above answers are "NO", may proceed with cephalosporin use.     Past Medical History, Surgical history, Social history, and Family History were reviewed and updated.  Review of Systems: Review of Systems  Constitutional: Negative.   HENT:  Negative.   Eyes: Negative.   Respiratory: Negative.   Cardiovascular: Positive for palpitations.  Gastrointestinal: Positive for blood in stool.  Endocrine: Negative.   Genitourinary: Negative.    Skin: Negative.   Neurological: Positive for extremity weakness.  Hematological: Negative.   Psychiatric/Behavioral: Negative.     Physical Exam:  vitals were not taken for this visit.   Wt Readings from Last 3 Encounters:  03/14/20 220 lb (99.8 kg)  02/24/20 216 lb (98 kg)  02/24/20 214 lb 0.6 oz (97.1 kg)    Physical Exam Vitals reviewed.  HENT:     Head: Normocephalic and atraumatic.  Eyes:     Pupils: Pupils are equal, round, and reactive to light.  Cardiovascular:     Rate and Rhythm: Normal rate and regular rhythm.     Heart sounds: Normal heart sounds.     Comments: Cardiac exam shows an irregular rate and irregular rhythm consistent with  atrial fibrillation.  The rate is fairly well controlled.   There are no murmurs, rubs or bruits. Pulmonary:     Effort: Pulmonary effort is normal.     Breath sounds: Normal breath sounds.  Abdominal:     General: Bowel sounds are normal.     Palpations: Abdomen is soft.     Comments: Abdominal exam is soft.  John Douglas has decent bowel sounds.  There is no guarding or rebound tenderness.  There is no fluid wave.  There is no palpable liver or spleen tip.  Musculoskeletal:        General: No tenderness or deformity. Normal range of motion.     Cervical back: Normal range of motion.  Lymphadenopathy:     Cervical: No cervical adenopathy.  Skin:    General: Skin is warm and dry.     Findings: No erythema or rash.  Neurological:     Mental Status: John Douglas is alert and oriented to person, place, and time.  Psychiatric:        Behavior: Behavior normal.        Thought Content: Thought content normal.        Judgment: Judgment normal.      Lab Results  Component Value Date   WBC 4.9 03/14/2020   HGB 11.2 (L) 03/14/2020   HCT 33.0 (L) 03/14/2020   MCV 95.7 03/14/2020   PLT 199 03/14/2020     Chemistry      Component Value Date/Time   NA 141 03/14/2020 0933   NA 140 08/28/2019 0940   K 3.6 03/14/2020 0933   CL 107 03/14/2020 0933   CO2 27 03/14/2020 0933   BUN 21 03/14/2020 0933   BUN 21 08/28/2019 0940   CREATININE 0.96 03/14/2020 0933      Component Value Date/Time   CALCIUM 9.1 03/14/2020 0933   ALKPHOS 53 03/14/2020 0933   AST 14 (L) 03/14/2020 0933   ALT 10 03/14/2020 0933   BILITOT 1.3 (H) 03/14/2020 0933      Impression and Plan: John Douglas is a very nice 64 year old white male.  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  John Douglas had a rectal bleeding.  John Douglas subsequently was found to have rectal cancer.  John Douglas had adenopathy that was distant to the rectum.  We will proceed with his eighth and hopefully final cycle of chemotherapy.  Again John Douglas is doing very  well.  Is hard to say what the CEA levels are.  I think they have been normal.  They have come down since we started treatment.  We will speak with Dr. Sondra Come of Radiation Oncology.  We will hopefully have him see John Douglas.  We are dealing with abdominal disease that is metastatic but yet is very treatable.  I would like to think that John Douglas has had a nice response to date.  After radiation is completed, we will probably get him back onto chemotherapy for a few more cycles.  I will plan to get him back to see me in about a month.  By then, John Douglas will start his radiation.    Volanda Napoleon, MD 11/15/202110:16 AM

## 2020-03-14 NOTE — Progress Notes (Signed)
Pt discharged in no apparent distress. Pt left ambulatory without assistance. Pt aware of discharge instructions and verbalized understanding and had no further questions.  

## 2020-03-15 ENCOUNTER — Encounter: Payer: Self-pay | Admitting: *Deleted

## 2020-03-15 DIAGNOSIS — C775 Secondary and unspecified malignant neoplasm of intrapelvic lymph nodes: Secondary | ICD-10-CM

## 2020-03-15 DIAGNOSIS — C20 Malignant neoplasm of rectum: Secondary | ICD-10-CM

## 2020-03-15 NOTE — Progress Notes (Signed)
Patient follow up appointments not made. Message sent to scheduling.   Referral order placed per Dr Marin Olp  Oncology Nurse Navigator Documentation  Oncology Nurse Navigator Flowsheets 03/15/2020  Abnormal Finding Date -  Confirmed Diagnosis Date -  Planned Course of Treatment -  Phase of Treatment -  Chemotherapy Actual Start Date: -  Navigator Follow Up Date: -  Navigator Follow Up Reason: -  Production assistant, radio Encounter Type Appt/Treatment Plan Review  Treatment Initiated Date -  Patient Visit Type MedOnc  Treatment Phase Active Tx  Barriers/Navigation Needs Coordination of Care;Education  Education -  Interventions Coordination of Care;Referrals  Acuity Level 2-Minimal Needs (1-2 Barriers Identified)  Referrals Radiation Oncology  Coordination of Care Appts  Education Method -  Support Groups/Services Friends and Family  Time Spent with Patient 30

## 2020-03-16 ENCOUNTER — Inpatient Hospital Stay: Payer: Medicare HMO

## 2020-03-16 ENCOUNTER — Other Ambulatory Visit: Payer: Self-pay

## 2020-03-16 VITALS — BP 129/72 | HR 92 | Temp 98.1°F | Resp 19

## 2020-03-16 DIAGNOSIS — C20 Malignant neoplasm of rectum: Secondary | ICD-10-CM

## 2020-03-16 DIAGNOSIS — Z5111 Encounter for antineoplastic chemotherapy: Secondary | ICD-10-CM | POA: Diagnosis not present

## 2020-03-16 DIAGNOSIS — C775 Secondary and unspecified malignant neoplasm of intrapelvic lymph nodes: Secondary | ICD-10-CM

## 2020-03-16 MED ORDER — SODIUM CHLORIDE 0.9% FLUSH
10.0000 mL | INTRAVENOUS | Status: DC | PRN
  Administered 2020-03-16: 10 mL
  Filled 2020-03-16: qty 10

## 2020-03-16 MED ORDER — HEPARIN SOD (PORK) LOCK FLUSH 100 UNIT/ML IV SOLN
500.0000 [IU] | Freq: Once | INTRAVENOUS | Status: AC | PRN
  Administered 2020-03-16: 500 [IU]
  Filled 2020-03-16: qty 5

## 2020-03-16 NOTE — Patient Instructions (Signed)

## 2020-03-16 NOTE — Progress Notes (Signed)
Pt discharged in no apparent distress. Pt left ambulatory without assistance. Pt aware of discharge instructions and verbalized understanding and had no further questions.  

## 2020-03-17 ENCOUNTER — Institutional Professional Consult (permissible substitution): Payer: Medicare HMO | Admitting: Radiation Oncology

## 2020-03-18 ENCOUNTER — Other Ambulatory Visit: Payer: Self-pay | Admitting: Hematology & Oncology

## 2020-03-18 MED ORDER — CAPECITABINE 500 MG PO TABS: 2000.0000 mg | ORAL_TABLET | Freq: Every day | ORAL | 0 refills | Status: DC

## 2020-03-18 NOTE — Addendum Note (Signed)
Addended by: Burney Gauze R on: 03/18/2020 02:07 PM   Modules accepted: Orders

## 2020-03-20 NOTE — Progress Notes (Signed)
GI Location of Tumor / Histology: Rectum  [name] presented after a  Colonoscopy 09/2018  Biopsies of  (if applicable) revealed: SURGICAL PATHOLOGY   THIS IS AN ADDENDUM REPORT  CASE: [id number]  PATIENT: John Douglas  Surgical Pathology Report    Reason for Addendum #1: DNA Mismatch Repair IHC Results   Clinical History: Rectal bleeding (crm)    FINAL MICROSCOPIC DIAGNOSIS:   A. COLON, DESCENDING, POLYPECTOMY:  - Hyperplastic polyp   B. RECTUM, BIOPSY:  - Adenocarcinoma. See comment   C. RECTUM, POLYPECTOMY:  - Hyperplastic polyp   Past/Anticipated interventions by surgeon, if any: none  Past/Anticipated interventions by medical oncology, if any: FOLFOX started wants to do Xeloda with radiation Weight changes, if any: has gained a few pounds.  Bowel/Bladder complaints, if any: no, gas today from lunch  Nausea / Vomiting, if any: no  Pain issues, if any:  no  Any blood per rectum:  Small amount after polyp removal.  SAFETY ISSUES:  Prior radiation? no  Pacemaker/ICD? No   Possible current pregnancy? n/a  Is the patient on methotrexate? no  Current Complaints/Details: none at this time.  BP 132/62 (BP Location: Left Arm, Patient Position: Sitting, Cuff Size: Large)   Pulse (!) 44   Temp 98.4 F (36.9 C)   Resp 20   Ht _0  (1.778 m)   Wt 221 lb 3.2 oz (100.3 kg)   SpO2 100%   BMI 31.74 kg/m   Wt Readings from Last 3 Encounters:  03/23/20 221 lb 3.2 oz (100.3 kg)  03/14/20 220 lb (99.8 kg)  02/24/20 216 lb (98 kg)

## 2020-03-21 ENCOUNTER — Telehealth: Payer: Self-pay | Admitting: Pharmacy Technician

## 2020-03-21 NOTE — Telephone Encounter (Signed)
Oral Oncology Patient Advocate Encounter  [employer] sent a rejection that the prior authorization for Xeloda could not be completed through ePA process and must call [phone] to complete the request.  Spoke to a representative who tried to transfer me to the Part B dept but could not get to the right person.  She sent a message to the part D team to forward to the correct dept.  Received faxed approval from [facility] that PA has been approved.  Approval dates: 03/21/20-03/21/21.  [place] Patient [place] Phone [phone] Fax [phone] 03/22/2020 9:37 AM

## 2020-03-21 NOTE — Telephone Encounter (Signed)
Oral Oncology Patient Advocate Encounter  Received notification from [facility] that prior authorization for Xeloda is required.  PA submitted on CoverMyMeds Key [id number]  Status is pending  Oral Oncology Clinic will continue to follow.  [place] [facility] [place] Phone [phone] Fax [phone] 03/21/2020 1:07 PM

## 2020-03-22 ENCOUNTER — Telehealth: Payer: Self-pay | Admitting: Pharmacist

## 2020-03-22 NOTE — Telephone Encounter (Signed)
Oral Oncology Pharmacist Encounter  Received new prescription for Xeloda (capecitabine) for the treatment of metastatic rectal cancer in conjunction with XRT, planned duration until the end of radiation.  CMP from 03/14/20 assessed, no relevant lab abnormalities. Prescription dose and frequency assessed. MD electing to do once daily Xeloda dosing vs bid dosing.  Current medication list in Epic reviewed, no DDIs with capecitabine identified.  Evaluated chart and no patient barriers to medication adherence identified.   Prescription has been e-scribed to the [facility] for benefits analysis and approval.  Oral Oncology Clinic will continue to follow for insurance authorization, copayment issues, initial counseling and start date.  Darl Pikes, PharmD, BCPS, BCOP, CPP Hematology/Oncology Clinical Pharmacist Practitioner [facility] [phone]  03/22/2020 3:59 PM

## 2020-03-23 ENCOUNTER — Ambulatory Visit
Admission: RE | Admit: 2020-03-23 | Discharge: 2020-03-23 | Disposition: A | Discharge status: Home / Self Care | Payer: Medicare HMO | Source: Ambulatory Visit | Attending: Radiation Oncology | Admitting: Radiation Oncology

## 2020-03-23 ENCOUNTER — Other Ambulatory Visit: Payer: Self-pay

## 2020-03-23 ENCOUNTER — Encounter: Payer: Self-pay | Admitting: Radiation Oncology

## 2020-03-23 VITALS — BP 132/62 | HR 44 | Temp 98.4°F | Resp 20 | Ht 70.0 in | Wt 221.2 lb

## 2020-03-23 DIAGNOSIS — I2510 Atherosclerotic heart disease of native coronary artery without angina pectoris: Secondary | ICD-10-CM | POA: Insufficient documentation

## 2020-03-23 DIAGNOSIS — Z7982 Long term (current) use of aspirin: Secondary | ICD-10-CM | POA: Diagnosis not present

## 2020-03-23 DIAGNOSIS — E669 Obesity, unspecified: Secondary | ICD-10-CM | POA: Insufficient documentation

## 2020-03-23 DIAGNOSIS — E785 Hyperlipidemia, unspecified: Secondary | ICD-10-CM | POA: Insufficient documentation

## 2020-03-23 DIAGNOSIS — C20 Malignant neoplasm of rectum: Secondary | ICD-10-CM | POA: Insufficient documentation

## 2020-03-23 DIAGNOSIS — Z79899 Other long term (current) drug therapy: Secondary | ICD-10-CM | POA: Diagnosis not present

## 2020-03-23 DIAGNOSIS — F419 Anxiety disorder, unspecified: Secondary | ICD-10-CM | POA: Diagnosis not present

## 2020-03-23 DIAGNOSIS — I10 Essential (primary) hypertension: Secondary | ICD-10-CM | POA: Insufficient documentation

## 2020-03-23 DIAGNOSIS — Z8673 Personal history of transient ischemic attack (TIA), and cerebral infarction without residual deficits: Secondary | ICD-10-CM | POA: Insufficient documentation

## 2020-03-23 DIAGNOSIS — C775 Secondary and unspecified malignant neoplasm of intrapelvic lymph nodes: Secondary | ICD-10-CM | POA: Insufficient documentation

## 2020-03-23 DIAGNOSIS — E1140 Type 2 diabetes mellitus with diabetic neuropathy, unspecified: Secondary | ICD-10-CM | POA: Diagnosis not present

## 2020-03-23 DIAGNOSIS — I252 Old myocardial infarction: Secondary | ICD-10-CM | POA: Insufficient documentation

## 2020-03-23 DIAGNOSIS — Z7901 Long term (current) use of anticoagulants: Secondary | ICD-10-CM | POA: Diagnosis not present

## 2020-03-23 DIAGNOSIS — D500 Iron deficiency anemia secondary to blood loss (chronic): Secondary | ICD-10-CM | POA: Insufficient documentation

## 2020-03-23 DIAGNOSIS — I4891 Unspecified atrial fibrillation: Secondary | ICD-10-CM | POA: Diagnosis not present

## 2020-03-23 DIAGNOSIS — I700 Atherosclerosis of aorta: Secondary | ICD-10-CM | POA: Diagnosis not present

## 2020-03-23 DIAGNOSIS — D1809 Hemangioma of other sites: Secondary | ICD-10-CM | POA: Insufficient documentation

## 2020-03-23 NOTE — Progress Notes (Signed)
Radiation Oncology         [phone] ________________________________  Initial Outpatient Consultation  Name: John Douglas MRN: [id number]  Date: 03/23/2020  DOB: 1955-05-10  CW:[id number], Lorin Mercy, MD  Volanda Napoleon, MD   REFERRING PHYSICIAN: Volanda Napoleon, MD  DIAGNOSIS: The encounter diagnosis was 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 ([place]).  Stage T4a, N2 metastatic adenocarcinoma of the rectum  HISTORY OF PRESENT ILLNESS::John Douglas is a 64 y.o. male who is seen as a courtesy of Dr. Marin Olp for an opinion concerning radiation therapy as part of management for his recently diagnosed rectal cancer. The patient presented to [facility], GI PA-C, on 06/04/2019 with complaints of loose stools and frequent hematochezia with increasing diarrhea. Colonoscopy on 10/07/2019 showed a malignant partially obstructing tumor in the proximal rectum. Pathology from the procedure revealed adenocarcinoma.  CT scan of chest/abdomen/pelvis on 10/10/2019 showed retroperitoneal and upper pelvic lymphadenopathy that was compatible with metastatic disease. There was also a hyperenhancing focus in the left hepatic lobe with surrounding changes in parenchymal attenuation on venous phase; possibly represented a flash filling hemangioma and appeared atypical for what would be expected for rectal cancer metastasis. MRI of pelvis on that same day showed rectal adenocarcinoma (T4a, N2) with retroperitoneal metastatic non-regional lymph nodes. Distance from tumor to the internal anal sphincter was approximately 4 cm.  MRI of liver on 10/12/2019 showed a 16 mm lesion in the left hepatic lobe that favored a flash filling hemangioma. There were all small para-aortic lymph nodes that measured up to 10 mm in short axis and were suspicious for nodal metastases.   CT scan of abdomen and pelvis on 12/17/2019 showed a mild decrease in abdominal and pelvic lymphadenopathy, consistent with improving  metastatic disease. There was no new or progressive metastatic disease identified.  CT scan of chest/abdomen/pelvis on 02/23/2020 mass-like mural thickening of the distal sigmoid colon and proximal rectum, concerning for primary colorectal neoplasm, with some adjacent borderline enlarged mesorectal lymph nodes, as well as mildly enlarged pelvic and retroperitoneal lymph nodes, likely to represent nodal metastatic disease. There was no definitive extra nodal metastatic disease noted elsewhere in the chest, abdomen, and pelvis. Hypervascular lesion in segment 2 of the liver was stable when compared to prior studies.  PET scan on 03/11/2020 showed thickened bowel at the rectosigmoid junction that had a maximum SUV of 6.8 that was suspicious for underlying malignancy. There was also a borderline enlarged partially calcified left common iliac lymph node and a borderline enlarged aortocaval lymph node, both 1.0 cm in short axis diameter and both with a metabolic activity just minimally above blood pool. Surveillance was likely warranted.  The patient has undergone eights cycles of FOLFOX (began on 11/17/2019) under the care of Dr. Marin Olp. Last cycle was on 03/14/2020.  Patient is now referred to radiation oncology for consideration for treatments directed at the primary site given the thickened bowel and persistent activity on PET scan in this region.  PREVIOUS RADIATION THERAPY: No  PAST MEDICAL HISTORY:  Past Medical History:  Diagnosis Date  . Allergic rhinitis    [facility]  . Anxiety   . Atrial fibrillation ([place])    [facility]  . Benign essential hypertension    [facility]  . Coronary artery disease    [facility]  . Diabetic neuropathy, type II diabetes mellitus ([place])     [facility]  . Dysrhythmia   . Iron deficiency  anemia due to chronic blood loss 11/18/2019  . Mixed hyperlipidemia     [facility]  . Myocardial infarct [facility])    [facility]  . Obesity, unspecified     [facility]  . Rectal bleeding     [facility]  . 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 ([place]) 11/17/2019  . Stroke ([place])   . Wide-complex tachycardia ([place]) 06/04/2019    PAST SURGICAL HISTORY: Past Surgical History:  Procedure Laterality Date  . BIOPSY  10/07/2019   Procedure: BIOPSY;  Surgeon: Lavena Bullion, DO;  Location: [facility];  Service: Gastroenterology;;  . COLONOSCOPY WITH PROPOFOL N/A 10/07/2019   Procedure: COLONOSCOPY WITH PROPOFOL;  Surgeon: Lavena Bullion, DO;  Location: [facility];  Service: Gastroenterology;  Laterality: N/A;  . CORONARY ANGIOPLASTY     3 stents  . IR IMAGING GUIDED PORT INSERTION  11/24/2019  . LEFT HEART CATH AND CORONARY ANGIOGRAPHY N/A 06/05/2019   Procedure: LEFT HEART CATH AND CORONARY ANGIOGRAPHY;  Surgeon: Nelva Bush, MD;  Location: [facility];  Service: Cardiovascular;  Laterality: N/A;  . POLYPECTOMY  10/07/2019   Procedure: POLYPECTOMY;  Surgeon: Lavena Bullion, DO;  Location: [facility];  Service: Gastroenterology;;  . Lia Foyer TATTOO INJECTION  10/07/2019   Procedure: SUBMUCOSAL TATTOO INJECTION;  Surgeon: Lavena Bullion, DO;  Location: [facility];  Service: Gastroenterology;;    FAMILY HISTORY:  Family History  Problem Relation Age of Onset  . Diverticulitis Mother   . Hypertension Father   . Heart disease Father        MI  . Prostate cancer Father   . Colon cancer Neg Hx   . Stomach cancer Neg Hx   . Pancreatic cancer Neg Hx   . Rectal cancer Neg Hx     SOCIAL HISTORY:  Social History   Tobacco Use  . Smoking status: Never Smoker  . Smokeless tobacco: Former Systems developer    Types: Secondary school teacher  . Vaping Use: Never used  Substance Use Topics  . Alcohol use: Not Currently    Comment: occasional  . Drug use: Not  Currently    Types: Marijuana    ALLERGIES:  Allergies  Allergen Reactions  . Celexa [Citalopram] Other (See Comments)    Contraindicated with A-Fib  . Ibuprofen Other (See Comments)    Was told to not take this   . Metformin And Related Other (See Comments)    Bloody stools   . Naproxen Other (See Comments)    Was told to not take this   . [place] Venom [Bee Venom] Swelling and Other (See Comments)    Severe swelling where stung  . Penicillins Hives    Did it involve swelling of the face/tongue/throat, SOB, or low BP? Unk Did it involve sudden or severe rash/hives, skin peeling, or any reaction on the inside of your mouth or nose? Yes Did you need to seek medical attention at a hospital or doctor's office? Unk When did it last happen?"Childhood- 55 years ago" If all above answers are "NO", may proceed with cephalosporin use.     MEDICATIONS:  Current Outpatient Medications  Medication Sig Dispense Refill  . acetaminophen (TYLENOL) 500 MG tablet Take 500 mg by mouth every 6 (six) hours as needed for fever.     Marland Kitchen apixaban (ELIQUIS) 5 MG TABS tablet Take 1 tablet (5 mg total) by mouth 2 (two) times daily. (Patient not taking: Reported on  02/24/2020) 60 tablet 5  . aspirin EC 81 MG tablet Take 1 tablet (81 mg total) by mouth daily. Swallow whole. 90 tablet 3  . capecitabine (XELODA) 500 MG tablet Take 4 tablets (2,000 mg total) by mouth daily. Take Xeloda on Monday thru Friday for 6 weeks.Start 2 days before radiation therapy starts 120 tablet 0  . diltiazem (CARDIZEM CD) 180 MG 24 hr capsule TAKE 1 CAPSULE BY MOUTH EVERY DAY 30 capsule 0  . ezetimibe (ZETIA) 10 MG tablet TAKE 1 TABLET BY MOUTH EVERY DAY 90 tablet 0  . gabapentin (NEURONTIN) 100 MG capsule TAKE 1 CAPSULE BY MOUTH AT BEDTIME. (Patient taking differently: Take 100 mg by mouth at bedtime. ) 30 capsule 3  . glimepiride (AMARYL) 2 MG tablet Take 1 tablet (2 mg total) by mouth daily with breakfast. 30 tablet 0  .  glucose blood test strip Monitor BS before meals and at bedtime for a couple of weeks. Then may decrease to bid before meals 100 each 12  . Lancets (ONETOUCH ULTRASOFT) lancets Use as instructed 100 each 12  . metoprolol succinate (TOPROL-XL) 100 MG 24 hr tablet TAKE 1 TABLET BY MOUTH TWICE A DAY IN MORNING AND BEDTIME 180 tablet 0  . nitroGLYCERIN (NITROSTAT) 0.4 MG SL tablet Place 0.4 mg under the tongue every 5 (five) minutes as needed for chest pain.     . rosuvastatin (CRESTOR) 20 MG tablet Take 1 tablet (20 mg total) by mouth daily. (Patient taking differently: Take 20 mg by mouth at bedtime. ) 30 tablet 0  . sertraline (ZOLOFT) 50 MG tablet Take 50 mg by mouth daily.     No current facility-administered medications for this encounter.    REVIEW OF SYSTEMS:  A 10+ POINT REVIEW OF SYSTEMS WAS OBTAINED including neurology, dermatology, psychiatry, cardiac, respiratory, lymph, extremities, GI, GU, musculoskeletal, constitutional, reproductive, HEENT.  Denies any pain in the pelvis region.  He denies any blood in his stools.  He does report loose stools and urge incontinence of stool when delaying bowel movements.  He denies any low back pain or urinary symptoms.   PHYSICAL EXAM:  height is 5\' 10"  (1.778 m) and weight is 221 lb 3.2 oz (100.3 kg). His temperature is 98.4 F (36.9 C). His blood pressure is 132/62 and his pulse is 44 (abnormal). His respiration is 20 and oxygen saturation is 100%.   General: Alert and oriented, in no acute distress HEENT: Head is normocephalic. Extraocular movements are intact. Neck: Neck is supple, no palpable cervical or supraclavicular lymphadenopathy. Heart: irregular rate and rhythm c/w with afib,  no murmurs, rubs, or gallops. Chest: Clear to auscultation bilaterally, with no rhonchi, wheezes, or rales. Abdomen: Soft, nontender, nondistended, with no rigidity or guarding. Extremities: No cyanosis or edema. Lymphatics: see Neck Exam Skin: No concerning  lesions. Musculoskeletal: symmetric strength and muscle tone throughout. Neurologic: Cranial nerves II through XII are grossly intact. No obvious focalities. Speech is fluent. Coordination is intact. Psychiatric: Judgment and insight are intact. Affect is appropriate.   ECOG = 1  0 - Asymptomatic (Fully active, able to carry on all predisease activities without restriction)  1 - Symptomatic but completely ambulatory (Restricted in physically strenuous activity but ambulatory and able to carry out work of a light or sedentary nature. For example, light housework, office work)  2 - Symptomatic, <50% in bed during the day (Ambulatory and capable of all self care but unable to carry out any work activities. Up and about more than  50% of waking hours)  3 - Symptomatic, >50% in bed, but not bedbound (Capable of only limited self-care, confined to bed or chair 50% or more of waking hours)  4 - Bedbound (Completely disabled. Cannot carry on any self-care. Totally confined to bed or chair)  5 - Death   Eustace Pen MM, Creech RH, Tormey DC, et al. [phone]). "Toxicity and response criteria of the [facility]". [place] Oncol. 5 (6): 649-55  LABORATORY DATA:  Lab Results  Component Value Date   WBC 4.9 03/14/2020   HGB 11.2 (L) 03/14/2020   HCT 33.0 (L) 03/14/2020   MCV 95.7 03/14/2020   PLT 199 03/14/2020   NEUTROABS 2.4 03/14/2020   Lab Results  Component Value Date   NA 141 03/14/2020   K 3.6 03/14/2020   CL 107 03/14/2020   CO2 27 03/14/2020   GLUCOSE 174 (H) 03/14/2020   CREATININE 0.96 03/14/2020   CALCIUM 9.1 03/14/2020      RADIOGRAPHY: CT CHEST ABDOMEN PELVIS W CONTRAST  Result Date: 02/23/2020 CLINICAL DATA:  64 year old male with history of colorectal cancer status post chemotherapy. Follow-up study. EXAM: CT CHEST, ABDOMEN, AND PELVIS WITH CONTRAST TECHNIQUE: Multidetector CT imaging of the chest, abdomen and pelvis was performed following the standard  protocol during bolus administration of intravenous contrast. CONTRAST:  171mL OMNIPAQUE IOHEXOL 300 MG/ML  SOLN COMPARISON:  CT the abdomen and pelvis 12/17/2019. CTA of the chest 12/16/2019. FINDINGS: CT CHEST FINDINGS Cardiovascular: Heart size is normal. There is no significant pericardial fluid, thickening or pericardial calcification. There is aortic atherosclerosis, as well as atherosclerosis of the great vessels of the mediastinum and the coronary arteries, including calcified atherosclerotic plaque in the left main, left anterior descending, left circumflex and right coronary arteries. Calcifications of the aortic valve and mitral annulus. Right internal jugular single-lumen porta cath with tip terminating at the superior cavoatrial junction. Mediastinum/Nodes: No pathologically enlarged mediastinal or hilar lymph nodes. Esophagus is unremarkable in appearance. No axillary lymphadenopathy. Lungs/Pleura: No suspicious appearing pulmonary nodules or masses are noted. No acute consolidative airspace disease. No pleural effusions. Musculoskeletal: There are no aggressive appearing lytic or blastic lesions noted in the visualized portions of the skeleton. CT ABDOMEN PELVIS FINDINGS Hepatobiliary: In segment 2 of the liver there is a well-defined 1.1 x 1.4 cm hypervascular lesion (axial image 53 of series 2), incompletely imaged on delayed images, but previously characterized as a cavernous hemangioma on prior abdominal MRI 10/12/2019. No other definite suspicious cystic or solid hepatic lesions. No intra or extrahepatic biliary ductal dilatation. Gallbladder is normal in appearance. Pancreas: No pancreatic mass. No pancreatic ductal dilatation. No pancreatic or peripancreatic fluid collections or inflammatory changes. Spleen: Unremarkable. Adrenals/Urinary Tract: 3 mm nonobstructive calculus in the interpolar collecting system of the right kidney. Bilateral kidneys and adrenal glands are otherwise normal in  appearance. No hydroureteronephrosis. Urinary bladder is normal in appearance. Stomach/Bowel: Normal appearance of the stomach. No pathologic dilatation of small bowel or colon. Mass-like mural thickening of the distal sigmoid colon and proximal rectum estimated to measure approximately 2.8 x 7.4 x 2.6 cm (coronal image 123 of series 4 and sagittal image 107 of series 5). A few scattered colonic diverticulae are noted, without surrounding inflammatory changes to suggest an acute diverticulitis at this time. Normal appendix. Vascular/Lymphatic: Aortic atherosclerosis, without evidence of aneurysm or dissection in the abdominal or pelvic vasculature. Borderline enlarged mesorectal lymph nodes measuring up to 6 mm on the right side (axial image 106 of series  2). Mildly enlarged left common iliac lymph node measuring 1.1 cm in short axis, similar to the prior study. Borderline enlarged and mildly enlarged retroperitoneal lymph nodes measuring up to 1.1 cm in short axis in the aortocaval nodal station (axial image 77 of series 2). Reproductive: Prostate gland and seminal vesicles are unremarkable in appearance. Other: No significant volume of ascites.  No pneumoperitoneum. Musculoskeletal: There are no aggressive appearing lytic or blastic lesions noted in the visualized portions of the skeleton. IMPRESSION: 1. Mass-like mural thickening of the distal sigmoid colon and proximal rectum, concerning for primary colorectal neoplasm, with some adjacent borderline enlarged mesorectal lymph nodes, as well as mildly enlarged pelvic and retroperitoneal lymph nodes, as above, likely to represent nodal metastatic disease. No definite extra nodal metastatic disease noted elsewhere in the chest, abdomen or pelvis. 2. Aortic atherosclerosis, in addition to left main and 3 vessel coronary artery disease. Please note that although the presence of coronary artery calcium documents the presence of coronary artery disease, the severity of  this disease and any potential stenosis cannot be assessed on this non-gated CT examination. Assessment for potential risk factor modification, dietary therapy or pharmacologic therapy may be warranted, if clinically indicated. 3. There are calcifications of the aortic valve and mitral annulus. Echocardiographic correlation for evaluation of potential valvular dysfunction may be warranted if clinically indicated. 4. Hypervascular lesion in segment 2 of the liver, stable compared to prior studies, previously characterized as a cavernous hemangioma. 5. 3 mm nonobstructive calculus in the interpolar collecting system of the right kidney. 6. Mild colonic diverticulosis without evidence of acute diverticulitis at this time. Electronically Signed   By: Vinnie Langton M.D.   On: 02/23/2020 11:37   NM PET Image Initial (PI) Skull Base To Thigh  Result Date: 03/11/2020 CLINICAL DATA:  Subsequent treatment strategy for rectal cancer is. EXAM: NUCLEAR MEDICINE PET SKULL BASE TO THIGH TECHNIQUE: 10.8 mCi F-18 FDG was injected intravenously. Full-ring PET imaging was performed from the skull base to thigh after the radiotracer. CT data was obtained and used for attenuation correction and anatomic localization. Fasting blood glucose: 132 mg/dl COMPARISON:  Multiple exams, including 02/23/2020 FINDINGS: Mediastinal blood pool activity: SUV max 2.4 Liver activity: SUV max NA NECK: Remote right posterior cerebral artery distribution infarct leading to reduced activity in this vicinity intracranially. Incidental CT findings: none CHEST: No significant abnormal hypermetabolic activity in this region. Incidental CT findings: Small bilateral pleural effusions. Coronary, aortic arch, and branch vessel atherosclerotic vascular disease. Right Port-A-Cath tip: Cavoatrial junction. Mitral and aortic valve calcifications. ABDOMEN/PELVIS: No abnormal activity associated with the previously enhancing lesion in the lateral segment left  hepatic lobe, probably a hemangioma based on prior imaging. Wall thickening at the rectosigmoid junction with associated accentuated metabolic activity, maximum SUV 6.8. Small adjacent perirectal lymph nodes are not individually hypermetabolic but are below sensitive PET-CT size thresholds. A partially calcified left common iliac node measuring 1.0 cm in short axis on image 157 of series 4 has a maximum SUV of 2.8, minimally above the blood pool. An aortocaval lymph node measuring 1.0 cm in short axis on image 135 of series 4 has a maximum SUV of 2.6, just above blood pool. A small right retrocrural node with short axis diameter of 0.8 cm on image 106 of series 4 has maximum SUV of 2.6. Incidental CT findings: Nonobstructive right nephrolithiasis. Aortoiliac atherosclerotic vascular disease. Sigmoid colon diverticulosis. SKELETON: No significant abnormal hypermetabolic activity in this region. Incidental CT findings: Lower lumbar spondylosis  and degenerative disc disease. IMPRESSION: 1. The thickened bowel at the rectosigmoid junction has a maximum SUV of 6.8, suspicious for underlying malignancy. 2. There is a borderline enlarged partially calcified left common iliac lymph node and a borderline enlarged aortocaval lymph node, both 1.0 cm in short axis diameter, and both with a metabolic activity just minimally above blood pool. Surveillance is likely warranted. 3. Other imaging findings of potential clinical significance: Old right PCA distribution stroke. Aortic Atherosclerosis (ICD10-I70.0). Coronary atherosclerosis. Mitral and aortic valve calcifications. Small bilateral pleural effusions. Nonobstructive right nephrolithiasis. Sigmoid colon diverticulosis. Lower lumbar spondylosis and degenerative disc disease. Electronically Signed   By: Van Clines M.D.   On: 03/11/2020 14:25      IMPRESSION: Stage T4a, N2 metastatic adenocarcinoma of the rectum  Patient has completed 8 cycles of FOLFOX with  overall improvement of his metastatic disease.  However recent PET scan shows persistent activity in the upper rectum/rectosigmoid area with thickened bowel wall.  It does not appear that the patient has had any recent changes in his bowel habits related to this bowel thickening.  We discussed options for management at this point including watchful waiting or proceeding with localized radiation therapy directed at the primary site.  He does wish to proceed with radiation therapy.  Patient would be a good candidate for radiosensitizing oral Xeloda chemotherapy along with his central pelvic radiation therapy.  Today, I talked to the patient  about the findings and work-up thus far. We discussed the natural history of rectal cancer and general treatment, highlighting the role of radiotherapy in the management.  We discussed the available radiation techniques, and focused on the details of logistics and delivery.  We reviewed the anticipated acute and late sequelae associated with radiation in this setting.  The patient was encouraged to ask questions that I answered to the best of my ability.  A patient consent form was discussed and signed.  We retained a copy for our records.  The patient would like to proceed with radiation and will be scheduled for CT simulation.  PLAN: Patient will proceed with CT simulation next week with treatments to begin the second week in December concomitant with oral Xeloda chemotherapy.  Anticipate 5-1/2 weeks of radiation therapy directed at the central pelvic region.  Total time spent in this encounter was 60 minutes which included reviewing the patient's most recent CT scans, MRIs, PET scan, consultations, follow-ups, chemotherapy, physical examination, and documentation.   ------------------------------------------------  Blair Promise, PhD, MD  This document serves as a record of services personally performed by Gery Pray, MD. It was created on his behalf by Clerance Lav, a trained medical scribe. The creation of this record is based on the scribe's personal observations and the provider's statements to them. This document has been checked and approved by the attending provider.

## 2020-03-30 ENCOUNTER — Ambulatory Visit
Admission: RE | Admit: 2020-03-30 | Discharge: 2020-03-30 | Disposition: A | Discharge status: Home / Self Care | Payer: Medicare HMO | Source: Ambulatory Visit | Attending: Radiation Oncology | Admitting: Radiation Oncology

## 2020-03-30 DIAGNOSIS — C20 Malignant neoplasm of rectum: Secondary | ICD-10-CM | POA: Insufficient documentation

## 2020-03-30 DIAGNOSIS — C775 Secondary and unspecified malignant neoplasm of intrapelvic lymph nodes: Secondary | ICD-10-CM

## 2020-03-30 DIAGNOSIS — Z510 Encounter for antineoplastic radiation therapy: Secondary | ICD-10-CM | POA: Diagnosis not present

## 2020-04-01 MED FILL — CAPECITABINE 500 MG TABLET: 500 | 42 days supply | Qty: 120 | Fill #0

## 2020-04-01 NOTE — Telephone Encounter (Signed)
Oral Oncology Patient Advocate Encounter  I spoke with Mr Baley daughter on 12/2 to set up delivery of Xeloda.  Address verified for shipment.  Xeloda will be filled through [facility] and mailed 12/3 for delivery 12/6.    [facility] will call 7-10 days before next refill is due to complete adherence call and set up delivery of medication.     [place] [facility] [place] Phone [phone] Fax [phone] 04/01/2020 1:44 PM

## 2020-04-03 ENCOUNTER — Other Ambulatory Visit: Payer: Self-pay | Admitting: Internal Medicine

## 2020-04-03 DIAGNOSIS — I4891 Unspecified atrial fibrillation: Secondary | ICD-10-CM

## 2020-04-03 DIAGNOSIS — I4892 Unspecified atrial flutter: Secondary | ICD-10-CM

## 2020-04-04 ENCOUNTER — Other Ambulatory Visit: Payer: Self-pay

## 2020-04-04 ENCOUNTER — Ambulatory Visit (INDEPENDENT_AMBULATORY_CARE_PROVIDER_SITE_OTHER): Payer: Medicare HMO | Admitting: Internal Medicine

## 2020-04-04 ENCOUNTER — Telehealth: Payer: Self-pay | Admitting: Pharmacist

## 2020-04-04 ENCOUNTER — Encounter: Payer: Self-pay | Admitting: Internal Medicine

## 2020-04-04 VITALS — BP 116/78 | HR 50 | Ht 70.0 in | Wt 224.0 lb

## 2020-04-04 DIAGNOSIS — I4819 Other persistent atrial fibrillation: Secondary | ICD-10-CM | POA: Diagnosis not present

## 2020-04-04 DIAGNOSIS — I255 Ischemic cardiomyopathy: Secondary | ICD-10-CM

## 2020-04-04 DIAGNOSIS — I5031 Acute diastolic (congestive) heart failure: Secondary | ICD-10-CM | POA: Diagnosis not present

## 2020-04-04 DIAGNOSIS — E785 Hyperlipidemia, unspecified: Secondary | ICD-10-CM

## 2020-04-04 DIAGNOSIS — I2510 Atherosclerotic heart disease of native coronary artery without angina pectoris: Secondary | ICD-10-CM

## 2020-04-04 MED ORDER — FUROSEMIDE 40 MG PO TABS: 40.0000 mg | ORAL_TABLET | Freq: Every day | ORAL | 2 refills | Status: DC

## 2020-04-04 MED ORDER — POTASSIUM CHLORIDE CRYS ER 20 MEQ PO TBCR: 20.0000 meq | EXTENDED_RELEASE_TABLET | Freq: Every day | ORAL | 2 refills | Status: DC

## 2020-04-04 NOTE — Progress Notes (Signed)
Follow-up Outpatient Visit Date: 04/04/2020  Primary Care Provider: Leonides Sake, MD [place] [address]  Chief Complaint: Follow-up CAD and atrial fibrillation  HPI:  Mr. Hyde is a 64 y.o. male with history of inferior STEMI and primary PCI to the RCA in [id number]/5732 complicated by sustained ventricular tachycardia in the setting of residual left coronary artery disease status post subsequent PCI to the mid LAD (06/2019),hypertension, hyperlipidemia,paroxysmalatrial fibrillationcomplicated by stroke in 09/2019, ischemic cardiomyopathy, type 2 diabetes mellitus, and recurrent GI bleedingwith subsequent diagnosis ofrectalcancer, who presents for follow-up of CAD, cardiomyopathy, and atrial fibrillation.  I last saw him in late October, which time he was doing fairly well, undergoing his seventh round of chemotherapy for rectal cancer.  He was in atrial fibrillation with reasonable heart rate control at that time.  He had previously been advised to begin apixaban by his oncologist, though he deferred adding this until we met in the office.  We agreed to transition from clopidogrel to apixaban 5 mg twice daily and aspirin 81 mg daily.  Today, Mr. Buehl reports that he has been feeling fairly well.  He notes some loose stools today, which he attributes to dietary indiscretion yesterday.  He has not had any chest pain, shortness of breath, palpitations, lightheadedness, or edema.  He has just started taking Xeloda in anticipation of starting radiation therapy for his rectal cancer later this week.  He remains on apixaban and aspirin without bleeding.  He is concerned about recent PET/CT finding of small bilateral pleural effusions.  --------------------------------------------------------------------------------------------------  Past Medical History:  Diagnosis Date  . Allergic rhinitis    [facility]  . Anxiety   . Benign essential hypertension     [facility]  . Coronary artery disease    [facility]  . Diabetic neuropathy, type II diabetes mellitus ([place])    [facility]  . Dysrhythmia   . Iron deficiency anemia due to chronic blood loss 11/18/2019  . Mixed hyperlipidemia    [facility]  . Myocardial infarct [facility])    [facility]  . Obesity, unspecified     [facility]  . Persistent atrial fibrillation ([place])   . Rectal bleeding     [facility]  . 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 ([place]) 11/17/2019  . Stroke ([place])   . Wide-complex tachycardia ([place]) 06/04/2019   Past Surgical History:  Procedure Laterality Date  . BIOPSY  10/07/2019   Procedure: BIOPSY;  Surgeon: Lavena Bullion, DO;  Location: [facility];  Service: Gastroenterology;;  . COLONOSCOPY WITH PROPOFOL N/A 10/07/2019   Procedure: COLONOSCOPY WITH PROPOFOL;  Surgeon: Lavena Bullion, DO;  Location: [facility];  Service: Gastroenterology;  Laterality: N/A;  . CORONARY ANGIOPLASTY     3 stents  . IR IMAGING GUIDED PORT INSERTION  11/24/2019  . LEFT HEART CATH AND CORONARY ANGIOGRAPHY N/A 06/05/2019   Procedure: LEFT HEART CATH AND CORONARY ANGIOGRAPHY;  Surgeon: Nelva Bush, MD;  Location: [facility];  Service: Cardiovascular;  Laterality: N/A;  . POLYPECTOMY  10/07/2019   Procedure: POLYPECTOMY;  Surgeon: Lavena Bullion, DO;  Location: [facility];  Service: Gastroenterology;;  . Lia Foyer TATTOO INJECTION  10/07/2019   Procedure: SUBMUCOSAL TATTOO INJECTION;  Surgeon: Lavena Bullion, DO;  Location: [facility];  Service: Gastroenterology;;     Recent CV Pertinent Labs: Lab Results  Component Value Date   CHOL  148 10/09/2019   CHOL 78 (L) 08/28/2019   HDL 26 (L) 10/09/2019   HDL 28 (L) 08/28/2019   LDLCALC 98 10/09/2019   LDLCALC 32 08/28/2019   TRIG 119 10/09/2019   CHOLHDL 5.7  10/09/2019   INR 1.3 (H) 12/16/2019   K 3.6 03/14/2020   MG 2.0 12/17/2019   BUN 21 03/14/2020   BUN 21 08/28/2019   CREATININE 0.96 03/14/2020    Past medical and surgical history were reviewed and updated in EPIC.  Current Meds  Medication Sig  . acetaminophen (TYLENOL) 500 MG tablet Take 500 mg by mouth every 6 (six) hours as needed for fever.   Marland Kitchen apixaban (ELIQUIS) 5 MG TABS tablet Take 1 tablet (5 mg total) by mouth 2 (two) times daily.  Marland Kitchen aspirin EC 81 MG tablet Take 1 tablet (81 mg total) by mouth daily. Swallow whole.  . capecitabine (XELODA) 500 MG tablet Take 4 tablets (2,000 mg total) by mouth daily. Take Xeloda on Monday thru Friday for 6 weeks.Start 2 days before radiation therapy starts  . diltiazem (CARDIZEM CD) 180 MG 24 hr capsule TAKE 1 CAPSULE BY MOUTH EVERY DAY  . ezetimibe (ZETIA) 10 MG tablet TAKE 1 TABLET BY MOUTH EVERY DAY  . gabapentin (NEURONTIN) 100 MG capsule TAKE 1 CAPSULE BY MOUTH AT BEDTIME. (Patient taking differently: Take 100 mg by mouth at bedtime. )  . glimepiride (AMARYL) 2 MG tablet Take 1 tablet (2 mg total) by mouth daily with breakfast.  . glucose blood test strip Monitor BS before meals and at bedtime for a couple of weeks. Then may decrease to bid before meals  . Lancets (ONETOUCH ULTRASOFT) lancets Use as instructed  . metoprolol succinate (TOPROL-XL) 100 MG 24 hr tablet TAKE 1 TABLET BY MOUTH TWICE A DAY IN MORNING AND BEDTIME  . nitroGLYCERIN (NITROSTAT) 0.4 MG SL tablet Place 0.4 mg under the tongue every 5 (five) minutes as needed for chest pain.   . rosuvastatin (CRESTOR) 20 MG tablet Take 1 tablet (20 mg total) by mouth daily. (Patient taking differently: Take 20 mg by mouth at bedtime. )  . sertraline (ZOLOFT) 50 MG tablet Take 50 mg by mouth daily.    Allergies: Celexa [citalopram], Ibuprofen, Metformin and related, Naproxen, Yellow jacket venom [bee venom], and Penicillins  Social History   Tobacco Use  . Smoking status: Never  Smoker  . Smokeless tobacco: Former Systems developer    Types: Secondary school teacher  . Vaping Use: Never used  Substance Use Topics  . Alcohol use: Not Currently    Comment: occasional  . Drug use: Not Currently    Types: Marijuana    Family History  Problem Relation Age of Onset  . Diverticulitis Mother   . Hypertension Father   . Heart disease Father        MI  . Prostate cancer Father   . Colon cancer Neg Hx   . Stomach cancer Neg Hx   . Pancreatic cancer Neg Hx   . Rectal cancer Neg Hx     Review of Systems: A 12-system review of systems was performed and was negative except as noted in the HPI.  --------------------------------------------------------------------------------------------------  Physical Exam: BP 116/78 (BP Location: Left Arm, Patient Position: Sitting, Cuff Size: Normal)   Pulse (!) 50   Ht 5' 10" (1.778 m)   Wt 224 lb (101.6 kg)   SpO2 92%   BMI 32.14 kg/m   General:  NAD. Neck: No JVD or HJR. Lungs:  Mildly diminished breath sounds at both bases.  No wheezes or crackles. Heart: Irregularly irregular without murmurs, rubs, or gallops. Abd: Soft, NT/ND. Ext: Trace bilateral ankle edema.  EKG:  Atrial fibrillation (ventricular rate 76 bpm) with lateral T wave inversions and possible inferior infarct.  Heart rate has decreased since 02/24/20.  Otherwise, no significant interval change.  Lab Results  Component Value Date   WBC 4.9 03/14/2020   HGB 11.2 (L) 03/14/2020   HCT 33.0 (L) 03/14/2020   MCV 95.7 03/14/2020   PLT 199 03/14/2020    Lab Results  Component Value Date   NA 141 03/14/2020   K 3.6 03/14/2020   CL 107 03/14/2020   CO2 27 03/14/2020   BUN 21 03/14/2020   CREATININE 0.96 03/14/2020   GLUCOSE 174 (H) 03/14/2020   ALT 10 03/14/2020    Lab Results  Component Value Date   CHOL 148 10/09/2019   HDL 26 (L) 10/09/2019   LDLCALC 98 10/09/2019   TRIG 119 10/09/2019   CHOLHDL 5.7 10/09/2019     --------------------------------------------------------------------------------------------------  ASSESSMENT AND PLAN: Persistent atrial fibrillation: Mr. Keziah remains in atrial fibrillation with reasonable ventricular rate control.  Given that he is asymptomatic and is scheduled to begin radiation therapy for his rectal cancer later this week, I think it would be best to continue a rate control strategy.  Once his cancer treatment has completed and physiologic stress decreased, we can consider restoration of sinus rhythm.  We will plan to continue apixaban 5 mg twice daily, given CHADSVASC score of at least 7.  We will continue current doses of metoprolol and diltiazem for rate control.  Coronary artery disease: No symptoms to suggest worsening coronary insufficiency.  We will continue aspirin and apixaban through February, if possible, at which time we will consider discontinuation of aspirin in the setting of long term anticoagulation with apixaban.  Continue statin therapy for secondary prevention.  Acute HFpEF and ischemic cardiomyopathy: Mr. Cremer denies symptoms but has exam findings consistent with mild volume overload.  Recent PET/CT also showed small bilateral pleural effusions, new.  We will repeat a limited echo to reassess his LVEF and start furosemide 40 mg PO daily and potassium chloride 20 mEq daily.  He is scheduled for labs with Dr. Marin Olp next week, at which time renal function and potassium should be rechecked.  Hyperlipidemia: Continue rosuvastatin 20 mg daily and ezetimibe 10 mg daily.  Follow-up: Return to clinic in 1 month.  Nelva Bush, MD 04/04/2020 9:34 PM

## 2020-04-04 NOTE — Patient Instructions (Signed)
Medication Instructions:  Your physician has recommended you make the following change in your medication:  1- START Lasix 40 mg by mouth once a day. 2- START Potassium 20 mEq by mouth once a day.  *If you need a refill on your cardiac medications before your next appointment, please call your pharmacy*  Lab Work: none If you have labs (blood work) drawn today and your tests are completely normal, you will receive your results only by: Marland Kitchen MyChart Message (if you have MyChart) OR . A paper copy in the mail If you have any lab test that is abnormal or we need to change your treatment, we will call you to review the results.   Testing/Procedures: Your physician has requested that you have an LIMITED echocardiogram. Echocardiography is a painless test that uses sound waves to create images of your heart. It provides your doctor with information about the size and shape of your heart and how well your heart's chambers and valves are working. This procedure takes approximately one hour. There are no restrictions for this procedure. There is a possibility that an IV may need to be started during your test to inject an image enhancing agent. This is done to obtain more optimal pictures of your heart. Therefore we ask that you do at least drink some water prior to coming in to hydrate your veins.   Follow-Up: At [facility], you and your health needs are our priority.  As part of our continuing mission to provide you with exceptional heart care, we have created designated Provider Care Teams.  These Care Teams include your primary Cardiologist (physician) and Advanced Practice Providers (APPs -  Physician Assistants and Nurse Practitioners) who all work together to provide you with the care you need, when you need it.  We recommend signing up for the patient portal called "MyChart".  Sign up information is provided on this After Visit Summary.  MyChart is used to connect with patients for Virtual  Visits (Telemedicine).  Patients are able to view lab/test results, encounter notes, upcoming appointments, etc.  Non-urgent messages can be sent to your provider as well.   To learn more about what you can do with MyChart, go to [email or web address].    Your next appointment:   1 month(s)  The format for your next appointment:   In Person  Provider:   You may see Nelva Bush, MD or one of the following Advanced Practice Providers on your designated Care Team:    Murray Hodgkins, NP  Christell Faith, PA-C  Marrianne Mood, PA-C  [place], [place]  Laurann Montana, NP

## 2020-04-04 NOTE — Telephone Encounter (Signed)
Oral Chemotherapy Pharmacist Encounter  Patient Education I spoke with patient for overview of new oral chemotherapy medication: Xeloda (capecitabine) for the treatment of metastatic rectal cancer in conjunction with XRT, planned duration until the end of radiation.   Counseled patient on administration, dosing, side effects, monitoring, drug-food interactions, safe handling, storage, and disposal. Patient will take 4 tablets (2,000 mg total) by mouth daily. Take Xeloda on Monday thru Friday for 6 weeks.Start 2 days before radiation therapy starts.  Side effects include but not limited to: diarrhea, hand-foot syndrome, N/V, fatigue, edema.    Reviewed with patient importance of keeping a medication schedule and plan for any missed doses.  After discussion with patient no patient barriers to medication adherence identified.   Mr. Magan voiced understanding and appreciation. All questions answered. Medication handout provided.  Provided patient with [facility] phone number. Patient knows to call the office with questions or concerns. Oral [facility] will continue to follow.  Darl Pikes, PharmD, BCPS, BCOP, CPP Hematology/Oncology Clinical Pharmacist Practitioner [facility]/HP/AP [facility] [phone]  04/04/2020 8:47 AM

## 2020-04-05 DIAGNOSIS — Z510 Encounter for antineoplastic radiation therapy: Secondary | ICD-10-CM | POA: Diagnosis not present

## 2020-04-06 ENCOUNTER — Ambulatory Visit
Admission: RE | Admit: 2020-04-06 | Discharge: 2020-04-06 | Disposition: A | Discharge status: Home / Self Care | Payer: Medicare HMO | Source: Ambulatory Visit | Attending: Radiation Oncology | Admitting: Radiation Oncology

## 2020-04-06 DIAGNOSIS — C20 Malignant neoplasm of rectum: Secondary | ICD-10-CM

## 2020-04-06 DIAGNOSIS — C775 Secondary and unspecified malignant neoplasm of intrapelvic lymph nodes: Secondary | ICD-10-CM

## 2020-04-06 DIAGNOSIS — Z510 Encounter for antineoplastic radiation therapy: Secondary | ICD-10-CM | POA: Diagnosis not present

## 2020-04-07 ENCOUNTER — Ambulatory Visit
Admission: RE | Admit: 2020-04-07 | Discharge: 2020-04-07 | Disposition: A | Discharge status: Home / Self Care | Payer: Medicare HMO | Source: Ambulatory Visit | Attending: Radiation Oncology | Admitting: Radiation Oncology

## 2020-04-07 DIAGNOSIS — Z510 Encounter for antineoplastic radiation therapy: Secondary | ICD-10-CM | POA: Diagnosis not present

## 2020-04-08 ENCOUNTER — Ambulatory Visit
Admission: RE | Admit: 2020-04-08 | Discharge: 2020-04-08 | Disposition: A | Discharge status: Home / Self Care | Payer: Medicare HMO | Source: Ambulatory Visit | Attending: Radiation Oncology | Admitting: Radiation Oncology

## 2020-04-08 ENCOUNTER — Other Ambulatory Visit: Payer: Self-pay

## 2020-04-08 DIAGNOSIS — Z510 Encounter for antineoplastic radiation therapy: Secondary | ICD-10-CM | POA: Diagnosis not present

## 2020-04-11 ENCOUNTER — Ambulatory Visit
Admission: RE | Admit: 2020-04-11 | Discharge: 2020-04-11 | Disposition: A | Discharge status: Home / Self Care | Payer: Medicare HMO | Source: Ambulatory Visit | Attending: Radiation Oncology | Admitting: Radiation Oncology

## 2020-04-11 DIAGNOSIS — Z510 Encounter for antineoplastic radiation therapy: Secondary | ICD-10-CM | POA: Diagnosis not present

## 2020-04-12 ENCOUNTER — Ambulatory Visit
Admission: RE | Admit: 2020-04-12 | Discharge: 2020-04-12 | Disposition: A | Discharge status: Home / Self Care | Payer: Medicare HMO | Source: Ambulatory Visit | Attending: Radiation Oncology | Admitting: Radiation Oncology

## 2020-04-12 DIAGNOSIS — Z510 Encounter for antineoplastic radiation therapy: Secondary | ICD-10-CM | POA: Diagnosis not present

## 2020-04-13 ENCOUNTER — Ambulatory Visit
Admission: RE | Admit: 2020-04-13 | Discharge: 2020-04-13 | Disposition: A | Discharge status: Home / Self Care | Payer: Medicare HMO | Source: Ambulatory Visit | Attending: Radiation Oncology | Admitting: Radiation Oncology

## 2020-04-13 ENCOUNTER — Other Ambulatory Visit: Payer: Self-pay

## 2020-04-13 DIAGNOSIS — Z510 Encounter for antineoplastic radiation therapy: Secondary | ICD-10-CM | POA: Diagnosis not present

## 2020-04-14 ENCOUNTER — Ambulatory Visit
Admission: RE | Admit: 2020-04-14 | Discharge: 2020-04-14 | Disposition: A | Discharge status: Home / Self Care | Payer: Medicare HMO | Source: Ambulatory Visit | Attending: Radiation Oncology | Admitting: Radiation Oncology

## 2020-04-14 ENCOUNTER — Inpatient Hospital Stay (HOSPITAL_BASED_OUTPATIENT_CLINIC_OR_DEPARTMENT_OTHER): Payer: Medicare HMO | Admitting: Hematology & Oncology

## 2020-04-14 ENCOUNTER — Encounter: Payer: Self-pay | Admitting: *Deleted

## 2020-04-14 ENCOUNTER — Telehealth: Payer: Self-pay

## 2020-04-14 ENCOUNTER — Encounter: Payer: Self-pay | Admitting: Hematology & Oncology

## 2020-04-14 ENCOUNTER — Inpatient Hospital Stay: Payer: Medicare HMO | Attending: Hematology & Oncology

## 2020-04-14 ENCOUNTER — Inpatient Hospital Stay: Payer: Medicare HMO

## 2020-04-14 DIAGNOSIS — D500 Iron deficiency anemia secondary to blood loss (chronic): Secondary | ICD-10-CM | POA: Diagnosis not present

## 2020-04-14 DIAGNOSIS — C775 Secondary and unspecified malignant neoplasm of intrapelvic lymph nodes: Secondary | ICD-10-CM | POA: Diagnosis not present

## 2020-04-14 DIAGNOSIS — C20 Malignant neoplasm of rectum: Secondary | ICD-10-CM | POA: Insufficient documentation

## 2020-04-14 DIAGNOSIS — Z7982 Long term (current) use of aspirin: Secondary | ICD-10-CM | POA: Diagnosis not present

## 2020-04-14 DIAGNOSIS — Z510 Encounter for antineoplastic radiation therapy: Secondary | ICD-10-CM | POA: Diagnosis not present

## 2020-04-14 DIAGNOSIS — Z7901 Long term (current) use of anticoagulants: Secondary | ICD-10-CM | POA: Diagnosis not present

## 2020-04-14 DIAGNOSIS — I4891 Unspecified atrial fibrillation: Secondary | ICD-10-CM | POA: Insufficient documentation

## 2020-04-14 LAB — CBC WITH DIFFERENTIAL (CANCER CENTER ONLY)
Abs Immature Granulocytes: 0.05 10*3/uL (ref 0.00–0.07)
Basophils Absolute: 0.1 10*3/uL (ref 0.0–0.1)
Basophils Relative: 1 %
Eosinophils Absolute: 0.1 10*3/uL (ref 0.0–0.5)
Eosinophils Relative: 1 %
HCT: 35.2 % — ABNORMAL LOW (ref 39.0–52.0)
Hemoglobin: 11.9 g/dL — ABNORMAL LOW (ref 13.0–17.0)
Immature Granulocytes: 1 %
Lymphocytes Relative: 22 %
Lymphs Abs: 1.9 10*3/uL (ref 0.7–4.0)
MCH: 33 pg (ref 26.0–34.0)
MCHC: 33.8 g/dL (ref 30.0–36.0)
MCV: 97.5 fL (ref 80.0–100.0)
Monocytes Absolute: 0.8 10*3/uL (ref 0.1–1.0)
Monocytes Relative: 9 %
Neutro Abs: 5.7 10*3/uL (ref 1.7–7.7)
Neutrophils Relative %: 66 %
Platelet Count: 223 10*3/uL (ref 150–400)
RBC: 3.61 MIL/uL — ABNORMAL LOW (ref 4.22–5.81)
RDW: 17.3 % — ABNORMAL HIGH (ref 11.5–15.5)
WBC Count: 8.6 10*3/uL (ref 4.0–10.5)
nRBC: 0 % (ref 0.0–0.2)

## 2020-04-14 LAB — CMP (CANCER CENTER ONLY)
ALT: 18 U/L (ref 0–44)
AST: 18 U/L (ref 15–41)
Albumin: 3.8 g/dL (ref 3.5–5.0)
Alkaline Phosphatase: 53 U/L (ref 38–126)
Anion gap: 10 (ref 5–15)
BUN: 30 mg/dL — ABNORMAL HIGH (ref 8–23)
CO2: 26 mmol/L (ref 22–32)
Calcium: 8.9 mg/dL (ref 8.9–10.3)
Chloride: 105 mmol/L (ref 98–111)
Creatinine: 1.39 mg/dL — ABNORMAL HIGH (ref 0.61–1.24)
GFR, Estimated: 56 mL/min — ABNORMAL LOW (ref >60)
Glucose, Bld: 178 mg/dL — ABNORMAL HIGH (ref 70–99)
Potassium: 3.7 mmol/L (ref 3.5–5.1)
Sodium: 141 mmol/L (ref 135–145)
Total Bilirubin: 0.9 mg/dL (ref 0.3–1.2)
Total Protein: 6.4 g/dL — ABNORMAL LOW (ref 6.5–8.1)

## 2020-04-14 MED ORDER — SERTRALINE HCL 50 MG PO TABS: 50.0000 mg | ORAL_TABLET | Freq: Every day | ORAL | 0 refills | Status: DC

## 2020-04-14 NOTE — Addendum Note (Signed)
Addended by: Burney Gauze R on: 04/14/2020 05:01 PM   Modules accepted: Orders

## 2020-04-14 NOTE — Progress Notes (Signed)
Hematology and Oncology Follow Up Visit  John Douglas [id number] Jul 31, 1955 64 y.o. 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 twice daily   Aspirin 81 mg p.o. daily  FOLFOX --  S/p cycle #7-- started on 11/17/2019  XRT/Xeloda -- start on 04/01/2020     Interim History:  John Douglas is in for follow-up. He did start the radiation with Xeloda. He is doing okay with this. He has had 6 cycles of treatment.  He has had no problems with the Xeloda so far.  His stools are a little bit loose but not diarrhea.  He is doing well on the Eliquis.  He has the atrial fibrillation.  He is eating well.  He has not had any nausea or vomiting.  He has had no rashes.  He has had no problems with dysuria or urinary frequency.  Overall, I really think he is done quite nicely.  His last CEA level was only 2.0.  Overall, his performance status is ECOG 1.  Medications:  Current Outpatient Medications:  .  acetaminophen (TYLENOL) 500 MG tablet, Take 500 mg by mouth every 6 (six) hours as needed for fever. , Disp: , Rfl:  .  apixaban (ELIQUIS) 5 MG TABS tablet, Take 1 tablet (5 mg total) by mouth 2 (two) times daily., Disp: 60 tablet, Rfl: 5 .  aspirin EC 81 MG tablet, Take 1 tablet (81 mg total) by mouth daily. Swallow whole., Disp: 90 tablet, Rfl: 3 .  capecitabine (XELODA) 500 MG tablet, Take 4 tablets (2,000 mg total) by mouth daily. Take Xeloda on Monday thru Friday for 6 weeks.Start 2 days before radiation therapy starts, Disp: 120 tablet, Rfl: 0 .  diltiazem (CARDIZEM CD) 180 MG 24 hr capsule, TAKE 1 CAPSULE BY MOUTH EVERY DAY, Disp: 30 capsule, Rfl: 0 .  ezetimibe (ZETIA) 10 MG tablet, TAKE 1 TABLET BY MOUTH EVERY DAY, Disp: 90 tablet, Rfl: 0 .  furosemide (LASIX) 40 MG tablet, Take 1 tablet (40 mg total) by mouth  daily., Disp: 30 tablet, Rfl: 2 .  gabapentin (NEURONTIN) 100 MG capsule, TAKE 1 CAPSULE BY MOUTH AT BEDTIME. (Patient taking differently: Take 100 mg by mouth at bedtime. ), Disp: 30 capsule, Rfl: 3 .  glimepiride (AMARYL) 2 MG tablet, Take 1 tablet (2 mg total) by mouth daily with breakfast., Disp: 30 tablet, Rfl: 0 .  glucose blood test strip, Monitor BS before meals and at bedtime for a couple of weeks. Then may decrease to bid before meals, Disp: 100 each, Rfl: 12 .  Lancets (ONETOUCH ULTRASOFT) lancets, Use as instructed, Disp: 100 each, Rfl: 12 .  metoprolol succinate (TOPROL-XL) 100 MG 24 hr tablet, TAKE 1 TABLET BY MOUTH TWICE A DAY IN MORNING AND BEDTIME, Disp: 180 tablet, Rfl: 0 .  nitroGLYCERIN (NITROSTAT) 0.4 MG SL tablet, Place 0.4 mg under the tongue every 5 (five) minutes as needed for chest pain. , Disp: , Rfl:  .  potassium chloride SA (KLOR-CON) 20 MEQ tablet, Take 1 tablet (20 mEq total) by mouth daily., Disp: 30 tablet, Rfl: 2 .  rosuvastatin (CRESTOR) 20 MG tablet, Take 1 tablet (20 mg total) by mouth daily. (Patient taking differently: Take 20 mg by mouth at bedtime. ), Disp: 30 tablet, Rfl: 0 .  sertraline (ZOLOFT) 50 MG tablet, Take 50 mg by mouth daily.,  Disp: , Rfl:   Allergies:  Allergies  Allergen Reactions  . Celexa [Citalopram] Other (See Comments)    Contraindicated with A-Fib  . Ibuprofen Other (See Comments)    Was told to not take this   . Metformin And Related Other (See Comments)    Bloody stools   . Naproxen Other (See Comments)    Was told to not take this   . Yellow Jacket Venom [Bee Venom] Swelling and Other (See Comments)    Severe swelling where stung  . Penicillins Hives    Did it involve swelling of the face/tongue/throat, SOB, or low BP? Unk Did it involve sudden or severe rash/hives, skin peeling, or any reaction on the inside of your mouth or nose? Yes Did you need to seek medical attention at a hospital or doctor's office? Unk When did it  last happen?"Childhood- 55 years ago" If all above answers are "NO", may proceed with cephalosporin use.     Past Medical History, Surgical history, Social history, and Family History were reviewed and updated.  Review of Systems: Review of Systems  Constitutional: Negative.   HENT:  Negative.   Eyes: Negative.   Respiratory: Negative.   Cardiovascular: Positive for palpitations.  Gastrointestinal: Positive for blood in stool.  Endocrine: Negative.   Genitourinary: Negative.    Skin: Negative.   Neurological: Positive for extremity weakness.  Hematological: Negative.   Psychiatric/Behavioral: Negative.     Physical Exam:  weight is 220 lb (99.8 kg) (pended). His oral temperature is 98.4 F (36.9 C) (pended). His blood pressure is 117/70 (pended) and his pulse is 58 (abnormal, pended). His respiration is 18 (pended) and oxygen saturation is 100% (pended).   Wt Readings from Last 3 Encounters:  04/14/20 (P) 220 lb (99.8 kg)  04/04/20 224 lb (101.6 kg)  03/23/20 221 lb 3.2 oz (100.3 kg)    Physical Exam Vitals reviewed.  HENT:     Head: Normocephalic and atraumatic.  Eyes:     Pupils: Pupils are equal, round, and reactive to light.  Cardiovascular:     Rate and Rhythm: Normal rate and regular rhythm.     Heart sounds: Normal heart sounds.     Comments: Cardiac exam shows an irregular rate and irregular rhythm consistent with atrial fibrillation.  The rate is fairly well controlled.   There are no murmurs, rubs or bruits. Pulmonary:     Effort: Pulmonary effort is normal.     Breath sounds: Normal breath sounds.  Abdominal:     General: Bowel sounds are normal.     Palpations: Abdomen is soft.     Comments: Abdominal exam is soft.  He has decent bowel sounds.  There is no guarding or rebound tenderness.  There is no fluid wave.  There is no palpable liver or spleen tip.  Musculoskeletal:        General: No tenderness or deformity. Normal range of motion.     Cervical  back: Normal range of motion.  Lymphadenopathy:     Cervical: No cervical adenopathy.  Skin:    General: Skin is warm and dry.     Findings: No erythema or rash.  Neurological:     Mental Status: He is alert and oriented to person, place, and time.  Psychiatric:        Behavior: Behavior normal.        Thought Content: Thought content normal.        Judgment: Judgment normal.  Lab Results  Component Value Date   WBC 8.6 04/14/2020   HGB 11.9 (L) 04/14/2020   HCT 35.2 (L) 04/14/2020   MCV 97.5 04/14/2020   PLT 223 04/14/2020     Chemistry      Component Value Date/Time   NA 141 03/14/2020 0933   NA 140 08/28/2019 0940   K 3.6 03/14/2020 0933   CL 107 03/14/2020 0933   CO2 27 03/14/2020 0933   BUN 21 03/14/2020 0933   BUN 21 08/28/2019 0940   CREATININE 0.96 03/14/2020 0933      Component Value Date/Time   CALCIUM 9.1 03/14/2020 0933   ALKPHOS 53 03/14/2020 0933   AST 14 (L) 03/14/2020 0933   ALT 10 03/14/2020 0933   BILITOT 1.3 (H) 03/14/2020 0933      Impression and Plan: John Douglas is a very nice 64 year old white male.  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  He had a rectal bleeding.  He subsequently was found to have rectal cancer.  He had adenopathy that was distant to the rectum.  He now is on the radiation and Xeloda portion of therapy.  He is doing well with his.  I know it is early on so it is hard to say what side effects he may have.  He is very tough.  He certainly can handle a lot of challenges.  We will try to be aggressive.  Unfortunately the insurance company would not allow him to have a full course of radiation.  We will now plan to get him back in January.  By then, he should be close to being finished if not done already.    Volanda Napoleon, MD 12/16/20212:41 PM

## 2020-04-14 NOTE — Telephone Encounter (Signed)
appts made and printed for pt per 04/14/20   aom

## 2020-04-14 NOTE — Progress Notes (Signed)
Oncology Nurse Navigator Documentation  Oncology Nurse Navigator Flowsheets 04/14/2020  Abnormal Finding Date -  Confirmed Diagnosis Date -  Planned Course of Treatment Chemo/Radiation Concurrent  Phase of Treatment Chemo/Radiation Concurrent  Chemotherapy Actual Start Date: -  Chemotherapy Actual End Date: 03/14/2020  Chemo/Radiation Concurrent Actual Start Date: 04/06/2020  Chemo/Radiation Concurrent Expected End Date: 04/27/2020  Navigator Follow Up Date: 05/09/2020  Navigator Follow Up Reason: Follow-up Appointment  Navigator Location [facility]  Navigator Encounter Type Appt/Treatment Plan Review  Treatment Initiated Date -  Patient Visit Type MedOnc  Treatment Phase Active Tx  Barriers/Navigation Needs Coordination of Care;Education  Education -  Interventions -  Acuity Level 2-Minimal Needs (1-2 Barriers Identified)  Referrals -  Coordination of Care -  Education Method -  Support Groups/Services Friends and Family  Time Spent with Patient 30

## 2020-04-15 ENCOUNTER — Ambulatory Visit
Admission: RE | Admit: 2020-04-15 | Discharge: 2020-04-15 | Disposition: A | Discharge status: Home / Self Care | Payer: Medicare HMO | Source: Ambulatory Visit | Attending: Radiation Oncology | Admitting: Radiation Oncology

## 2020-04-15 DIAGNOSIS — Z510 Encounter for antineoplastic radiation therapy: Secondary | ICD-10-CM | POA: Diagnosis not present

## 2020-04-15 LAB — CEA (IN HOUSE-CHCC): CEA (CHCC-In House): 1.93 ng/mL (ref 0.00–5.00)

## 2020-04-18 ENCOUNTER — Ambulatory Visit
Admission: RE | Admit: 2020-04-18 | Discharge: 2020-04-18 | Disposition: A | Discharge status: Home / Self Care | Payer: Medicare HMO | Source: Ambulatory Visit | Attending: Radiation Oncology | Admitting: Radiation Oncology

## 2020-04-18 ENCOUNTER — Other Ambulatory Visit: Payer: Self-pay | Admitting: Hematology & Oncology

## 2020-04-18 DIAGNOSIS — Z510 Encounter for antineoplastic radiation therapy: Secondary | ICD-10-CM | POA: Diagnosis not present

## 2020-04-19 ENCOUNTER — Ambulatory Visit
Admission: RE | Admit: 2020-04-19 | Discharge: 2020-04-19 | Disposition: A | Discharge status: Home / Self Care | Payer: Medicare HMO | Source: Ambulatory Visit | Attending: Radiation Oncology | Admitting: Radiation Oncology

## 2020-04-19 DIAGNOSIS — Z510 Encounter for antineoplastic radiation therapy: Secondary | ICD-10-CM | POA: Diagnosis not present

## 2020-04-20 ENCOUNTER — Ambulatory Visit
Admission: RE | Admit: 2020-04-20 | Discharge: 2020-04-20 | Disposition: A | Discharge status: Home / Self Care | Payer: Medicare HMO | Source: Ambulatory Visit | Attending: Radiation Oncology | Admitting: Radiation Oncology

## 2020-04-20 DIAGNOSIS — Z510 Encounter for antineoplastic radiation therapy: Secondary | ICD-10-CM | POA: Diagnosis not present

## 2020-04-21 ENCOUNTER — Ambulatory Visit
Admission: RE | Admit: 2020-04-21 | Discharge: 2020-04-21 | Disposition: A | Discharge status: Home / Self Care | Payer: Medicare HMO | Source: Ambulatory Visit | Attending: Radiation Oncology | Admitting: Radiation Oncology

## 2020-04-21 ENCOUNTER — Other Ambulatory Visit: Payer: Self-pay

## 2020-04-21 DIAGNOSIS — Z510 Encounter for antineoplastic radiation therapy: Secondary | ICD-10-CM | POA: Diagnosis not present

## 2020-04-25 ENCOUNTER — Ambulatory Visit
Admission: RE | Admit: 2020-04-25 | Discharge: 2020-04-25 | Disposition: A | Discharge status: Home / Self Care | Payer: Medicare HMO | Source: Ambulatory Visit | Attending: Radiation Oncology | Admitting: Radiation Oncology

## 2020-04-25 DIAGNOSIS — Z510 Encounter for antineoplastic radiation therapy: Secondary | ICD-10-CM | POA: Diagnosis not present

## 2020-04-26 ENCOUNTER — Ambulatory Visit
Admission: RE | Admit: 2020-04-26 | Discharge: 2020-04-26 | Disposition: A | Discharge status: Home / Self Care | Payer: Medicare HMO | Source: Ambulatory Visit | Attending: Radiation Oncology | Admitting: Radiation Oncology

## 2020-04-26 DIAGNOSIS — Z510 Encounter for antineoplastic radiation therapy: Secondary | ICD-10-CM | POA: Diagnosis not present

## 2020-04-27 ENCOUNTER — Encounter: Payer: Self-pay | Admitting: Radiation Oncology

## 2020-04-27 ENCOUNTER — Ambulatory Visit
Admission: RE | Admit: 2020-04-27 | Discharge: 2020-04-27 | Disposition: A | Discharge status: Home / Self Care | Payer: Medicare HMO | Source: Ambulatory Visit | Attending: Radiation Oncology | Admitting: Radiation Oncology

## 2020-04-27 DIAGNOSIS — Z510 Encounter for antineoplastic radiation therapy: Secondary | ICD-10-CM | POA: Diagnosis not present

## 2020-04-28 ENCOUNTER — Other Ambulatory Visit: Payer: Self-pay | Admitting: Internal Medicine

## 2020-04-28 ENCOUNTER — Ambulatory Visit: Payer: Medicare HMO

## 2020-04-28 DIAGNOSIS — I4892 Unspecified atrial flutter: Secondary | ICD-10-CM

## 2020-04-28 DIAGNOSIS — I4891 Unspecified atrial fibrillation: Secondary | ICD-10-CM

## 2020-05-02 ENCOUNTER — Ambulatory Visit: Payer: Medicare HMO

## 2020-05-03 ENCOUNTER — Other Ambulatory Visit: Payer: Medicare HMO

## 2020-05-03 ENCOUNTER — Ambulatory Visit: Payer: Medicare HMO

## 2020-05-04 ENCOUNTER — Ambulatory Visit: Payer: Medicare HMO

## 2020-05-04 ENCOUNTER — Ambulatory Visit (INDEPENDENT_AMBULATORY_CARE_PROVIDER_SITE_OTHER): Payer: Medicare HMO

## 2020-05-04 ENCOUNTER — Other Ambulatory Visit: Payer: Self-pay

## 2020-05-04 DIAGNOSIS — I4819 Other persistent atrial fibrillation: Secondary | ICD-10-CM

## 2020-05-04 DIAGNOSIS — I255 Ischemic cardiomyopathy: Secondary | ICD-10-CM | POA: Diagnosis not present

## 2020-05-04 DIAGNOSIS — I5031 Acute diastolic (congestive) heart failure: Secondary | ICD-10-CM | POA: Diagnosis not present

## 2020-05-04 DIAGNOSIS — I2510 Atherosclerotic heart disease of native coronary artery without angina pectoris: Secondary | ICD-10-CM

## 2020-05-04 LAB — ECHOCARDIOGRAM LIMITED: S' Lateral: 3.7 cm

## 2020-05-04 MED ORDER — PERFLUTREN LIPID MICROSPHERE
1.0000 mL | INTRAVENOUS | Status: AC | PRN
  Administered 2020-05-04: 2 mL via INTRAVENOUS

## 2020-05-05 ENCOUNTER — Telehealth: Payer: Self-pay | Admitting: Internal Medicine

## 2020-05-05 ENCOUNTER — Ambulatory Visit: Payer: Medicare HMO

## 2020-05-05 NOTE — Telephone Encounter (Signed)
Yvonne Kendall, MD  05/05/2020 2:01 PM EST      Please let Mer. Ruffolo know that his echo shows that his heart's contracting function has decreased slightly but is still in the low-normal range. This can be seen with atrial fibrillation and may also be related to his recent chemotherapy. I recommend that he continue his current medications and follow-up as previously arranged.

## 2020-05-05 NOTE — Telephone Encounter (Signed)
Attempted to call the patient. No answer- I left a message to please call back. Phone note started.  

## 2020-05-06 ENCOUNTER — Ambulatory Visit: Payer: Medicare HMO

## 2020-05-06 NOTE — Telephone Encounter (Signed)
Results called to patient's daughter, ok per DPR. She verbalized understanding of the results and will let the patient know.

## 2020-05-09 ENCOUNTER — Other Ambulatory Visit: Payer: Self-pay

## 2020-05-09 ENCOUNTER — Encounter: Payer: Self-pay | Admitting: Hematology & Oncology

## 2020-05-09 ENCOUNTER — Inpatient Hospital Stay: Payer: Medicare HMO | Attending: Hematology & Oncology

## 2020-05-09 ENCOUNTER — Ambulatory Visit: Payer: Medicare HMO

## 2020-05-09 ENCOUNTER — Inpatient Hospital Stay (HOSPITAL_BASED_OUTPATIENT_CLINIC_OR_DEPARTMENT_OTHER): Payer: Medicare HMO | Admitting: Hematology & Oncology

## 2020-05-09 VITALS — BP 124/66 | HR 56 | Temp 97.6°F | Resp 18 | Wt 224.2 lb

## 2020-05-09 DIAGNOSIS — C20 Malignant neoplasm of rectum: Secondary | ICD-10-CM | POA: Insufficient documentation

## 2020-05-09 DIAGNOSIS — Z7901 Long term (current) use of anticoagulants: Secondary | ICD-10-CM | POA: Insufficient documentation

## 2020-05-09 DIAGNOSIS — R350 Frequency of micturition: Secondary | ICD-10-CM | POA: Diagnosis not present

## 2020-05-09 DIAGNOSIS — C775 Secondary and unspecified malignant neoplasm of intrapelvic lymph nodes: Secondary | ICD-10-CM

## 2020-05-09 DIAGNOSIS — I4891 Unspecified atrial fibrillation: Secondary | ICD-10-CM | POA: Insufficient documentation

## 2020-05-09 DIAGNOSIS — D500 Iron deficiency anemia secondary to blood loss (chronic): Secondary | ICD-10-CM

## 2020-05-09 LAB — CMP (CANCER CENTER ONLY)
ALT: 16 U/L (ref 0–44)
AST: 19 U/L (ref 15–41)
Albumin: 4.1 g/dL (ref 3.5–5.0)
Alkaline Phosphatase: 55 U/L (ref 38–126)
Anion gap: 10 (ref 5–15)
BUN: 29 mg/dL — ABNORMAL HIGH (ref 8–23)
CO2: 27 mmol/L (ref 22–32)
Calcium: 9.3 mg/dL (ref 8.9–10.3)
Chloride: 104 mmol/L (ref 98–111)
Creatinine: 1.17 mg/dL (ref 0.61–1.24)
GFR, Estimated: >60 mL/min (ref >60)
Glucose, Bld: 173 mg/dL — ABNORMAL HIGH (ref 70–99)
Potassium: 3.5 mmol/L (ref 3.5–5.1)
Sodium: 141 mmol/L (ref 135–145)
Total Bilirubin: 1.1 mg/dL (ref 0.3–1.2)
Total Protein: 6.8 g/dL (ref 6.5–8.1)

## 2020-05-09 LAB — CBC WITH DIFFERENTIAL (CANCER CENTER ONLY)
Abs Immature Granulocytes: 0.04 10*3/uL (ref 0.00–0.07)
Basophils Absolute: 0 10*3/uL (ref 0.0–0.1)
Basophils Relative: 0 %
Eosinophils Absolute: 0.4 10*3/uL (ref 0.0–0.5)
Eosinophils Relative: 5 %
HCT: 35.8 % — ABNORMAL LOW (ref 39.0–52.0)
Hemoglobin: 12.5 g/dL — ABNORMAL LOW (ref 13.0–17.0)
Immature Granulocytes: 1 %
Lymphocytes Relative: 16 %
Lymphs Abs: 1.2 10*3/uL (ref 0.7–4.0)
MCH: 34.1 pg — ABNORMAL HIGH (ref 26.0–34.0)
MCHC: 34.9 g/dL (ref 30.0–36.0)
MCV: 97.5 fL (ref 80.0–100.0)
Monocytes Absolute: 0.7 10*3/uL (ref 0.1–1.0)
Monocytes Relative: 9 %
Neutro Abs: 5.3 10*3/uL (ref 1.7–7.7)
Neutrophils Relative %: 69 %
Platelet Count: 163 10*3/uL (ref 150–400)
RBC: 3.67 MIL/uL — ABNORMAL LOW (ref 4.22–5.81)
RDW: 16.3 % — ABNORMAL HIGH (ref 11.5–15.5)
WBC Count: 7.7 10*3/uL (ref 4.0–10.5)
nRBC: 0 % (ref 0.0–0.2)

## 2020-05-09 LAB — IRON AND TIBC
Iron: 99 ug/dL (ref 42–163)
Saturation Ratios: 28 % (ref 20–55)
TIBC: 354 ug/dL (ref 202–409)
UIBC: 255 ug/dL (ref 117–376)

## 2020-05-09 LAB — LACTATE DEHYDROGENASE: LDH: 214 U/L — ABNORMAL HIGH (ref 98–192)

## 2020-05-09 LAB — FERRITIN: Ferritin: 183 ng/mL (ref 24–336)

## 2020-05-09 LAB — CEA (IN HOUSE-CHCC): CEA (CHCC-In House): 1.9 ng/mL (ref 0.00–5.00)

## 2020-05-09 NOTE — Progress Notes (Signed)
Hematology and Oncology Follow Up Visit  John Douglas [id number] 1955/07/02 65 y.o. 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 twice daily   Aspirin 81 mg p.o. daily  FOLFOX --  S/p cycle #8-- started on 11/17/2019  XRT/Xeloda -- start on 04/01/2020 -- completed 04/27/2020     Interim History:  Mr. Sellitto is in for follow-up.  He does look quite good.  He did complete his radiation and Xeloda back on 04/27/2020.  He had 15 treatments.  He does have some loose stool.  He says when he has to go he has to go.  He also has some urinary frequency.  I am not surprised by any of this.  There has been no bleeding.  He has had no problems with nausea or vomiting.  He does feel tired.  He says the radiation did tire him out much more so than chemotherapy.  He has had no palpitations.  He does have the atrial fibrillation and is on Eliquis.  He has had no leg swelling.  He did have a nice Christmas and New Year's.  Has been no headache.  Overall, his performance status is ECOG 1.    Medications:  Current Outpatient Medications:  .  apixaban (ELIQUIS) 5 MG TABS tablet, Take 1 tablet (5 mg total) by mouth 2 (two) times daily., Disp: 60 tablet, Rfl: 5 .  aspirin EC 81 MG tablet, Take 1 tablet (81 mg total) by mouth daily. Swallow whole., Disp: 90 tablet, Rfl: 3 .  capecitabine (XELODA) 500 MG tablet, Take 4 tablets (2,000 mg total) by mouth daily. Take Xeloda on Monday thru Friday for 6 weeks.Start 2 days before radiation therapy starts, Disp: 120 tablet, Rfl: 0 .  diltiazem (CARDIZEM CD) 180 MG 24 hr capsule, TAKE 1 CAPSULE BY MOUTH EVERY DAY, Disp: 30 capsule, Rfl: 0 .  ezetimibe (ZETIA) 10 MG tablet, TAKE 1 TABLET BY MOUTH EVERY DAY, Disp: 90 tablet, Rfl: 0 .  furosemide (LASIX) 40 MG tablet, Take 1 tablet  (40 mg total) by mouth daily., Disp: 30 tablet, Rfl: 2 .  gabapentin (NEURONTIN) 100 MG capsule, TAKE 1 CAPSULE BY MOUTH AT BEDTIME. (Patient taking differently: Take 100 mg by mouth at bedtime.), Disp: 30 capsule, Rfl: 3 .  glimepiride (AMARYL) 2 MG tablet, Take 1 tablet (2 mg total) by mouth daily with breakfast., Disp: 30 tablet, Rfl: 0 .  glucose blood test strip, Monitor BS before meals and at bedtime for a couple of weeks. Then may decrease to bid before meals, Disp: 100 each, Rfl: 12 .  Lancets (ONETOUCH ULTRASOFT) lancets, Use as instructed, Disp: 100 each, Rfl: 12 .  lisinopril (ZESTRIL) 2.5 MG tablet, Take 1 tablet by mouth daily., Disp: , Rfl:  .  metoprolol succinate (TOPROL-XL) 100 MG 24 hr tablet, TAKE 1 TABLET BY MOUTH TWICE A DAY IN MORNING AND BEDTIME, Disp: 180 tablet, Rfl: 0 .  nitroGLYCERIN (NITROSTAT) 0.4 MG SL tablet, Place 0.4 mg under the tongue every 5 (five) minutes as needed for chest pain. , Disp: , Rfl:  .  potassium chloride SA (KLOR-CON) 20 MEQ tablet, Take 1 tablet (20 mEq total) by mouth daily., Disp: 30 tablet, Rfl: 2 .  sertraline (ZOLOFT) 50 MG tablet, Take 1 tablet (50 mg total) by mouth daily., Disp: 90 tablet, Rfl: 0 .  acetaminophen (TYLENOL) 500 MG tablet, Take 500 mg by mouth every 6 (six) hours as needed for fever.  (Patient not taking: Reported on 05/09/2020), Disp: , Rfl:  .  rosuvastatin (CRESTOR) 20 MG tablet, Take 1 tablet (20 mg total) by mouth daily. (Patient taking differently: Take 20 mg by mouth at bedtime. ), Disp: 30 tablet, Rfl: 0  Allergies:  Allergies  Allergen Reactions  . Celexa [Citalopram] Other (See Comments)    Contraindicated with A-Fib  . Ibuprofen Other (See Comments)    Was told to not take this   . Metformin And Related Other (See Comments)    Bloody stools   . Naproxen Other (See Comments)    Was told to not take this   . Yellow Jacket Venom [Bee Venom] Swelling and Other (See Comments)    Severe swelling where stung  .  Penicillins Hives    Did it involve swelling of the face/tongue/throat, SOB, or low BP? Unk Did it involve sudden or severe rash/hives, skin peeling, or any reaction on the inside of your mouth or nose? Yes Did you need to seek medical attention at a hospital or doctor's office? Unk When did it last happen?"Childhood- 55 years ago" If all above answers are "NO", may proceed with cephalosporin use.     Past Medical History, Surgical history, Social history, and Family History were reviewed and updated.  Review of Systems: Review of Systems  Constitutional: Negative.   HENT:  Negative.   Eyes: Negative.   Respiratory: Negative.   Cardiovascular: Positive for palpitations.  Gastrointestinal: Positive for blood in stool.  Endocrine: Negative.   Genitourinary: Negative.    Skin: Negative.   Neurological: Positive for extremity weakness.  Hematological: Negative.   Psychiatric/Behavioral: Negative.     Physical Exam:  weight is 224 lb 4 oz (101.7 kg). His oral temperature is 97.6 F (36.4 C). His blood pressure is 124/66 and his pulse is 56 (abnormal). His respiration is 18 and oxygen saturation is 98%.   Wt Readings from Last 3 Encounters:  05/09/20 224 lb 4 oz (101.7 kg)  04/14/20 (P) 220 lb (99.8 kg)  04/04/20 224 lb (101.6 kg)    Physical Exam Vitals reviewed.  HENT:     Head: Normocephalic and atraumatic.  Eyes:     Pupils: Pupils are equal, round, and reactive to light.  Cardiovascular:     Rate and Rhythm: Normal rate and regular rhythm.     Heart sounds: Normal heart sounds.     Comments: Cardiac exam shows an irregular rate and irregular rhythm consistent with atrial fibrillation.  The rate is fairly well controlled.   There are no murmurs, rubs or bruits. Pulmonary:     Effort: Pulmonary effort is normal.     Breath sounds: Normal breath sounds.  Abdominal:     General: Bowel sounds are normal.     Palpations: Abdomen is soft.     Comments: Abdominal exam is  soft.  He has decent bowel sounds.  There is no guarding or rebound tenderness.  There is no fluid wave.  There is no palpable liver or spleen tip.  Musculoskeletal:        General: No tenderness or deformity. Normal range of motion.     Cervical back: Normal range of motion.  Lymphadenopathy:     Cervical: No cervical adenopathy.  Skin:    General: Skin is warm and dry.     Findings: No erythema or rash.  Neurological:  Mental Status: He is alert and oriented to person, place, and time.  Psychiatric:        Behavior: Behavior normal.        Thought Content: Thought content normal.        Judgment: Judgment normal.      Lab Results  Component Value Date   WBC 7.7 05/09/2020   HGB 12.5 (L) 05/09/2020   HCT 35.8 (L) 05/09/2020   MCV 97.5 05/09/2020   PLT 163 05/09/2020     Chemistry      Component Value Date/Time   NA 141 04/14/2020 1410   NA 140 08/28/2019 0940   K 3.7 04/14/2020 1410   CL 105 04/14/2020 1410   CO2 26 04/14/2020 1410   BUN 30 (H) 04/14/2020 1410   BUN 21 08/28/2019 0940   CREATININE 1.39 (H) 04/14/2020 1410      Component Value Date/Time   CALCIUM 8.9 04/14/2020 1410   ALKPHOS 53 04/14/2020 1410   AST 18 04/14/2020 1410   ALT 18 04/14/2020 1410   BILITOT 0.9 04/14/2020 1410      Impression and Plan: Mr. Cabiness is a very nice 65 year old white male.  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  He had a rectal bleeding.  He subsequently was found to have rectal cancer.  He had adenopathy that was distant to the rectum.  He now has completed radiation and chemotherapy.  We will now plan for follow-up full dose chemotherapy with FOLFOX.  I will give him 4 cycles.  I think that after that, we might be able to be finished or possibly think about some maintenance therapy with Xeloda.  At some point, we probably will have to repeat a CT scan.  I would not get 1 probably until February to let all the radiation get out of his  system.  His labs look okay so we can certainly start him back on treatment in a couple weeks.  I will see him back when he comes in for his 10th cycle of FOLFOX.  Marland Kitchen    Volanda Napoleon, MD 1/10/202211:02 AM

## 2020-05-10 ENCOUNTER — Encounter: Payer: Self-pay | Admitting: *Deleted

## 2020-05-10 ENCOUNTER — Ambulatory Visit: Payer: Medicare HMO

## 2020-05-10 ENCOUNTER — Telehealth: Payer: Self-pay

## 2020-05-10 NOTE — Progress Notes (Signed)
Oncology Nurse Navigator Documentation  Oncology Nurse Navigator Flowsheets 05/10/2020  Abnormal Finding Date -  Confirmed Diagnosis Date -  Planned Course of Treatment -  Phase of Treatment -  Chemotherapy Actual Start Date: -  Chemotherapy Actual End Date: -  Chemo/Radiation Concurrent Actual Start Date: -  Chemo/Radiation Concurrent Expected End Date: -  Navigator Follow Up Date: 05/23/2020  Navigator Follow Up Reason: Chemotherapy  Navigator Location [facility]  Navigator Encounter Type Appt/Treatment Plan Review  Treatment Initiated Date -  Patient Visit Type MedOnc  Treatment Phase Active Tx  Barriers/Navigation Needs Coordination of Care;Education  Education -  Interventions None Required  Acuity Level 2-Minimal Needs (1-2 Barriers Identified)  Referrals -  Coordination of Care -  Education Method -  Support Groups/Services Friends and Family  Time Spent with Patient 15

## 2020-05-10 NOTE — Telephone Encounter (Signed)
appts scheduled per 05/09/20 los, pt will get new appts at 05/23/20 appt and through mychart    aom

## 2020-05-11 ENCOUNTER — Ambulatory Visit: Payer: Medicare HMO

## 2020-05-12 ENCOUNTER — Ambulatory Visit: Payer: Medicare HMO

## 2020-05-13 ENCOUNTER — Ambulatory Visit: Payer: Medicare HMO

## 2020-05-16 ENCOUNTER — Ambulatory Visit: Payer: Medicare HMO

## 2020-05-17 ENCOUNTER — Ambulatory Visit: Payer: Medicare HMO

## 2020-05-17 NOTE — Progress Notes (Signed)
Follow-up Outpatient Visit Date: 05/18/2020  Primary Care Provider: Leonides Sake, MD [place] [address]  Chief Complaint: Follow-up coronary artery disease, cardiomyopathy, and persistent atrial fibrillation  HPI:  John Douglas is a 65 y.o. male with history of CAD with  inferior STEMI and primary PCI to the RCA in [id number] complicated by sustained ventricular tachycardia in the setting of residual left coronary artery disease status post subsequent PCI to the mid LAD (06/2019),hypertension, hyperlipidemia,paroxysmalatrial fibrillationcomplicated by stroke in 09/2019, ischemic cardiomyopathy, type 2 diabetes mellitus, and recurrent GI bleedingwith subsequent diagnosis ofrectalcancer s/p neoadjuvant chemotherapy now undergoing XRT, who presents for follow-up of CAD, a-fib, and cardiomyopathy.  I last saw John Douglas on 04/04/2020, at which time he was feeling fairly well.  He was about to begin radiation therapy for his rectal cancer.  In the setting of incidentally noted small bilateral pleural effusions on his PET/CT, we agreed to repeat an echo to reassess his LVEF as well as start furosemide 40 mg PO daily.  Echo showed stable LVEF of 50-55%.  Today, John Douglas notes that he has been feeling fairly fatigued.  This began with radiation therapy and seems to be getting gradually better after having completed XRT in late December.  He notes that his balance is still off, forcing him frequently to ambulate with a cane.  He attributes this to left leg weakness from his stroke last summer.  He has not fallen.  He denies chest pain, palpitations, and lightheadedness.  He has some occasional swelling in his hands, though this has been minimal.  He does not feel any different since furosemide was started.  He has continued to gain weight.  He is scheduled for follow-up with Dr. Marin Douglas early next month and anticipates receiving additional chemotherapy.  He has not had any significant  bleeding.  --------------------------------------------------------------------------------------------------  Past Medical History:  Diagnosis Date  . Allergic rhinitis    [facility]  . Anxiety   . Benign essential hypertension    [facility]  . Coronary artery disease    [facility]  . Diabetic neuropathy, type II diabetes mellitus ([place])    [facility]  . Dysrhythmia   . Iron deficiency anemia due to chronic blood loss 11/18/2019  . Mixed hyperlipidemia    [facility]  . Myocardial infarct [facility])    [facility]  . Obesity, unspecified    [facility]  . Persistent atrial fibrillation ([place])   . Rectal bleeding     [facility]  . 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 ([place]) 11/17/2019  . Stroke ([place])   . Wide-complex tachycardia ([place]) 06/04/2019   Past Surgical History:  Procedure Laterality Date  . BIOPSY  10/07/2019   Procedure: BIOPSY;  Surgeon: Lavena Bullion, DO;  Location: [facility];  Service: Gastroenterology;;  . COLONOSCOPY WITH PROPOFOL N/A 10/07/2019   Procedure: COLONOSCOPY WITH PROPOFOL;  Surgeon: Lavena Bullion, DO;  Location: [facility];  Service: Gastroenterology;  Laterality: N/A;  . CORONARY ANGIOPLASTY     3 stents  . IR IMAGING GUIDED PORT INSERTION  11/24/2019  . LEFT HEART CATH AND CORONARY ANGIOGRAPHY N/A 06/05/2019   Procedure: LEFT HEART CATH AND CORONARY ANGIOGRAPHY;  Surgeon: Nelva Bush, MD;  Location: [facility];  Service: Cardiovascular;  Laterality: N/A;  . POLYPECTOMY  10/07/2019   Procedure: POLYPECTOMY;  Surgeon: Lavena Bullion, DO;  Location: [facility];  Service: Gastroenterology;;  . SUBMUCOSAL TATTOO INJECTION  10/07/2019   Procedure: SUBMUCOSAL TATTOO INJECTION;  Surgeon: Lavena Bullion, DO;  Location: [facility];  Service:  Gastroenterology;;    Current Meds  Medication Sig  . apixaban (ELIQUIS) 5 MG TABS tablet Take 1 tablet (5 mg total) by mouth 2 (two) times daily.  Marland Kitchen aspirin EC 81 MG tablet Take 1 tablet (81 mg total) by mouth daily. Swallow whole.  . diltiazem (CARDIZEM CD) 180 MG 24 hr capsule TAKE 1 CAPSULE BY MOUTH EVERY DAY  . ezetimibe (ZETIA) 10 MG tablet TAKE 1 TABLET BY MOUTH EVERY DAY  . furosemide (LASIX) 40 MG tablet Take 1 tablet (40 mg total) by mouth daily.  Marland Kitchen gabapentin (NEURONTIN) 100 MG capsule TAKE 1 CAPSULE BY MOUTH AT BEDTIME. (Patient taking differently: Take 100 mg by mouth at bedtime.)  . glimepiride (AMARYL) 2 MG tablet Take 1 tablet (2 mg total) by mouth daily with breakfast.  . glucose blood test strip Monitor BS before meals and at bedtime for a couple of weeks. Then may decrease to bid before meals  . Lancets (ONETOUCH ULTRASOFT) lancets Use as instructed  . metoprolol succinate (TOPROL-XL) 100 MG 24 hr tablet TAKE 1 TABLET BY MOUTH TWICE A DAY IN MORNING AND BEDTIME  . nitroGLYCERIN (NITROSTAT) 0.4 MG SL tablet Place 0.4 mg under the tongue every 5 (five) minutes as needed for chest pain.   . potassium chloride SA (KLOR-CON) 20 MEQ tablet Take 1 tablet (20 mEq total) by mouth daily.  . rosuvastatin (CRESTOR) 20 MG tablet Take 1 tablet (20 mg total) by mouth daily. (Patient taking differently: Take 20 mg by mouth at bedtime.)  . sertraline (ZOLOFT) 50 MG tablet Take 1 tablet (50 mg total) by mouth daily.  . [DISCONTINUED] lisinopril (ZESTRIL) 2.5 MG tablet Take 1 tablet by mouth daily.    Allergies: Celexa [citalopram], Ibuprofen, Metformin and related, Naproxen, Yellow jacket venom [bee venom], and Penicillins  Social History   Tobacco Use  . Smoking status: Never Smoker  . Smokeless tobacco: Former Systems developer    Types: Secondary school teacher  . Vaping Use: Never used  Substance Use Topics  . Alcohol use: Not Currently    Comment: occasional  . Drug use: Not Currently    Types:  Marijuana    Family History  Problem Relation Age of Onset  . Diverticulitis Mother   . Hypertension Father   . Heart disease Father        MI  . Prostate cancer Father   . Colon cancer Neg Hx   . Stomach cancer Neg Hx   . Pancreatic cancer Neg Hx   . Rectal cancer Neg Hx     Review of Systems: A 12-system review of systems was performed and was negative except as noted in the HPI.  --------------------------------------------------------------------------------------------------  Physical Exam: BP 108/80 (BP Location: Left Arm, Patient Position: Sitting, Cuff Size: Large)   Pulse 84   Ht 5\' 10"  (1.778 m)   Wt 227 lb 4 oz (103.1 kg)   SpO2 98%   BMI 32.61 kg/m   General:  NAD. Neck: No JVD or HJR. Lungs: Clear to auscultation bilaterally without wheezes or crackles. Heart: Regular rate and rhythm without murmurs, rubs, or gallops. Abdomen: Soft, nontender, nondistended. Extremities: No lower extremity edema.  EKG: Atrial fibrillation with PVCs versus aberrancy and inferior infarct.  PVC versus a [place] is new.  Otherwise, no significant change from 04/04/2020.  Lab Results  Component Value Date   WBC 7.7 05/09/2020   HGB 12.5 (L) 05/09/2020   HCT 35.8 (L) 05/09/2020   MCV 97.5 05/09/2020   PLT 163 05/09/2020    Lab Results  Component Value Date   NA 141 05/09/2020   K 3.5 05/09/2020   CL 104 05/09/2020   CO2 27 05/09/2020   BUN 29 (H) 05/09/2020   CREATININE 1.17 05/09/2020   GLUCOSE 173 (H) 05/09/2020   ALT 16 05/09/2020    Lab Results  Component Value Date   CHOL 148 10/09/2019   HDL 26 (L) 10/09/2019   LDLCALC 98 10/09/2019   TRIG 119 10/09/2019   CHOLHDL 5.7 10/09/2019    --------------------------------------------------------------------------------------------------  ASSESSMENT AND PLAN: Coronary artery disease: John Douglas does not have any angina and is almost a year out from his most recent PCI to the LAD in 06/2019.  We have agreed to  discontinue aspirin early next month, remaining on indefinite anticoagulation with apixaban.  We will also continue current doses of metoprolol, diltiazem, ezetimibe, and rosuvastatin.  Chronic HFpEF: John Douglas appears euvolemic on examination today without any obvious edema though his weight has been gradually trending up.  He has not noticed any significant improvement with addition of furosemide, though overall he feels relatively well other than fatigue that has gradually been improving since he finished radiation therapy.  We will defer medication changes today.  Recent echo showed low normal LVEF of 50-55%.  Persistent atrial fibrillation: John Douglas remains in atrial fibrillation with reasonable rate control.  He is tolerating apixaban well without significant bleeding (CHA2DS2-VASc score = 7).  As he has now completed chemotherapy and radiation, we discussed the potential for cardioversion.  John Douglas wishes to defer this for now.  Rectal cancer: John Douglas has completed neoadjuvant chemotherapy as well as radiation.  He is scheduled for follow-up with Dr. Marin Douglas next month.  Hyperlipidemia: LDL above goal at last check in 09/2019, now on rosuvastatin and ezetimibe.  Consider repeating lipid panel at follow-up visit.  Follow-up: Return to clinic in 3 months.  Nelva Bush, MD 05/18/2020 12:05 PM

## 2020-05-18 ENCOUNTER — Ambulatory Visit: Payer: Medicare HMO | Admitting: Internal Medicine

## 2020-05-18 ENCOUNTER — Other Ambulatory Visit: Payer: Self-pay

## 2020-05-18 ENCOUNTER — Encounter: Payer: Self-pay | Admitting: Internal Medicine

## 2020-05-18 VITALS — BP 108/80 | HR 84 | Ht 70.0 in | Wt 227.2 lb

## 2020-05-18 DIAGNOSIS — I4819 Other persistent atrial fibrillation: Secondary | ICD-10-CM | POA: Diagnosis not present

## 2020-05-18 DIAGNOSIS — C20 Malignant neoplasm of rectum: Secondary | ICD-10-CM | POA: Diagnosis not present

## 2020-05-18 DIAGNOSIS — I5032 Chronic diastolic (congestive) heart failure: Secondary | ICD-10-CM | POA: Diagnosis not present

## 2020-05-18 DIAGNOSIS — E785 Hyperlipidemia, unspecified: Secondary | ICD-10-CM

## 2020-05-18 DIAGNOSIS — I2510 Atherosclerotic heart disease of native coronary artery without angina pectoris: Secondary | ICD-10-CM

## 2020-05-18 NOTE — Patient Instructions (Signed)
Medication Instructions:  Your physician recommends that you continue on your current medications as directed. Please refer to the Current Medication list given to you today.  *If you need a refill on your cardiac medications before your next appointment, please call your pharmacy*  Follow-Up: At [facility], you and your health needs are our priority.  As part of our continuing mission to provide you with exceptional heart care, we have created designated Provider Care Teams.  These Care Teams include your primary Cardiologist (physician) and Advanced Practice Providers (APPs -  Physician Assistants and Nurse Practitioners) who all work together to provide you with the care you need, when you need it.  We recommend signing up for the patient portal called "MyChart".  Sign up information is provided on this After Visit Summary.  MyChart is used to connect with patients for Virtual Visits (Telemedicine).  Patients are able to view lab/test results, encounter notes, upcoming appointments, etc.  Non-urgent messages can be sent to your provider as well.   To learn more about what you can do with MyChart, go to [email or web address].    Your next appointment:   3 month(s)  The format for your next appointment:   In Person  Provider:   You may see Christopher End, MD or one of the following Advanced Practice Providers on your designated Care Team:    Christopher Berge, NP  Ryan Dunn, PA-C  Jacquelyn Visser, PA-C  Cadence Furth, PA-C  Caitlin Walker, NP  

## 2020-05-19 ENCOUNTER — Encounter: Payer: Self-pay | Admitting: Internal Medicine

## 2020-05-19 DIAGNOSIS — I5032 Chronic diastolic (congestive) heart failure: Secondary | ICD-10-CM | POA: Insufficient documentation

## 2020-05-23 ENCOUNTER — Telehealth: Payer: Self-pay | Admitting: *Deleted

## 2020-05-23 ENCOUNTER — Inpatient Hospital Stay: Payer: Medicare HMO

## 2020-05-23 ENCOUNTER — Telehealth: Payer: Self-pay | Admitting: Hematology & Oncology

## 2020-05-23 NOTE — Telephone Encounter (Signed)
Opened in error

## 2020-05-23 NOTE — Telephone Encounter (Signed)
Patient daughter called and advised she & her husband have tested positive for COVID as of 1/24.  She will take her father to be tested on Friday and call us back with these results so we can get him back on the schedule for treatment.  She understood these instructions as well.

## 2020-05-25 ENCOUNTER — Inpatient Hospital Stay: Payer: Medicare HMO

## 2020-05-26 ENCOUNTER — Telehealth: Payer: Self-pay | Admitting: *Deleted

## 2020-05-26 NOTE — Telephone Encounter (Signed)
Call received from patient's daughter [name] to inform Dr. Marin Olp that pt has tested positive for covid-19 on 05/24/20. [name] states that pt is feeling "ok at this time."  Message sent to scheduling and Dr. Marin Olp notified.

## 2020-05-30 ENCOUNTER — Ambulatory Visit: Payer: Self-pay | Admitting: Radiation Oncology

## 2020-05-30 ENCOUNTER — Other Ambulatory Visit: Payer: Self-pay | Admitting: Internal Medicine

## 2020-05-30 DIAGNOSIS — I4892 Unspecified atrial flutter: Secondary | ICD-10-CM

## 2020-05-30 DIAGNOSIS — I4891 Unspecified atrial fibrillation: Secondary | ICD-10-CM

## 2020-05-30 NOTE — Telephone Encounter (Signed)
Rx request sent to pharmacy.  

## 2020-06-03 ENCOUNTER — Other Ambulatory Visit (HOSPITAL_COMMUNITY): Payer: Self-pay | Admitting: Internal Medicine

## 2020-06-06 ENCOUNTER — Ambulatory Visit: Payer: Medicare HMO | Admitting: Hematology & Oncology

## 2020-06-06 ENCOUNTER — Other Ambulatory Visit: Payer: Medicare HMO

## 2020-06-06 ENCOUNTER — Ambulatory Visit: Payer: Medicare HMO

## 2020-06-18 ENCOUNTER — Other Ambulatory Visit: Payer: Self-pay | Admitting: Internal Medicine

## 2020-06-19 ENCOUNTER — Other Ambulatory Visit: Payer: Self-pay | Admitting: Internal Medicine

## 2020-06-20 ENCOUNTER — Encounter: Payer: Self-pay | Admitting: Hematology & Oncology

## 2020-06-20 ENCOUNTER — Inpatient Hospital Stay (HOSPITAL_BASED_OUTPATIENT_CLINIC_OR_DEPARTMENT_OTHER): Payer: Medicare HMO | Admitting: Hematology & Oncology

## 2020-06-20 ENCOUNTER — Other Ambulatory Visit: Payer: Self-pay

## 2020-06-20 ENCOUNTER — Telehealth: Payer: Self-pay

## 2020-06-20 ENCOUNTER — Encounter: Payer: Self-pay | Admitting: Radiation Oncology

## 2020-06-20 ENCOUNTER — Inpatient Hospital Stay: Payer: Medicare HMO

## 2020-06-20 ENCOUNTER — Inpatient Hospital Stay: Payer: Medicare HMO | Attending: Hematology & Oncology

## 2020-06-20 ENCOUNTER — Ambulatory Visit
Admission: RE | Admit: 2020-06-20 | Discharge: 2020-06-20 | Disposition: A | Discharge status: Home / Self Care | Payer: Medicare HMO | Source: Ambulatory Visit | Attending: Radiation Oncology | Admitting: Radiation Oncology

## 2020-06-20 VITALS — BP 109/66 | HR 69 | Temp 98.1°F | Resp 18 | Wt 228.0 lb

## 2020-06-20 DIAGNOSIS — C775 Secondary and unspecified malignant neoplasm of intrapelvic lymph nodes: Secondary | ICD-10-CM

## 2020-06-20 DIAGNOSIS — Z452 Encounter for adjustment and management of vascular access device: Secondary | ICD-10-CM | POA: Diagnosis not present

## 2020-06-20 DIAGNOSIS — I4891 Unspecified atrial fibrillation: Secondary | ICD-10-CM | POA: Diagnosis not present

## 2020-06-20 DIAGNOSIS — C20 Malignant neoplasm of rectum: Secondary | ICD-10-CM | POA: Diagnosis not present

## 2020-06-20 DIAGNOSIS — Z923 Personal history of irradiation: Secondary | ICD-10-CM | POA: Insufficient documentation

## 2020-06-20 DIAGNOSIS — Z5111 Encounter for antineoplastic chemotherapy: Secondary | ICD-10-CM | POA: Insufficient documentation

## 2020-06-20 DIAGNOSIS — D500 Iron deficiency anemia secondary to blood loss (chronic): Secondary | ICD-10-CM | POA: Diagnosis not present

## 2020-06-20 DIAGNOSIS — Z7901 Long term (current) use of anticoagulants: Secondary | ICD-10-CM | POA: Insufficient documentation

## 2020-06-20 DIAGNOSIS — Z79899 Other long term (current) drug therapy: Secondary | ICD-10-CM | POA: Insufficient documentation

## 2020-06-20 LAB — CMP (CANCER CENTER ONLY)
ALT: 10 U/L (ref 0–44)
AST: 12 U/L — ABNORMAL LOW (ref 15–41)
Albumin: 4.3 g/dL (ref 3.5–5.0)
Alkaline Phosphatase: 63 U/L (ref 38–126)
Anion gap: 8 (ref 5–15)
BUN: 22 mg/dL (ref 8–23)
CO2: 31 mmol/L (ref 22–32)
Calcium: 9.6 mg/dL (ref 8.9–10.3)
Chloride: 102 mmol/L (ref 98–111)
Creatinine: 1.14 mg/dL (ref 0.61–1.24)
GFR, Estimated: >60 mL/min (ref >60)
Glucose, Bld: 283 mg/dL — ABNORMAL HIGH (ref 70–99)
Potassium: 3.6 mmol/L (ref 3.5–5.1)
Sodium: 141 mmol/L (ref 135–145)
Total Bilirubin: 1 mg/dL (ref 0.3–1.2)
Total Protein: 6.8 g/dL (ref 6.5–8.1)

## 2020-06-20 LAB — CBC WITH DIFFERENTIAL (CANCER CENTER ONLY)
Abs Immature Granulocytes: 0.05 10*3/uL (ref 0.00–0.07)
Basophils Absolute: 0 10*3/uL (ref 0.0–0.1)
Basophils Relative: 0 %
Eosinophils Absolute: 0.2 10*3/uL (ref 0.0–0.5)
Eosinophils Relative: 2 %
HCT: 36.9 % — ABNORMAL LOW (ref 39.0–52.0)
Hemoglobin: 13.1 g/dL (ref 13.0–17.0)
Immature Granulocytes: 1 %
Lymphocytes Relative: 15 %
Lymphs Abs: 1.1 10*3/uL (ref 0.7–4.0)
MCH: 32.7 pg (ref 26.0–34.0)
MCHC: 35.5 g/dL (ref 30.0–36.0)
MCV: 92 fL (ref 80.0–100.0)
Monocytes Absolute: 0.6 10*3/uL (ref 0.1–1.0)
Monocytes Relative: 8 %
Neutro Abs: 5.5 10*3/uL (ref 1.7–7.7)
Neutrophils Relative %: 74 %
Platelet Count: 224 10*3/uL (ref 150–400)
RBC: 4.01 MIL/uL — ABNORMAL LOW (ref 4.22–5.81)
RDW: 14.4 % (ref 11.5–15.5)
WBC Count: 7.4 10*3/uL (ref 4.0–10.5)
nRBC: 0 % (ref 0.0–0.2)

## 2020-06-20 LAB — LACTATE DEHYDROGENASE: LDH: 191 U/L (ref 98–192)

## 2020-06-20 LAB — CEA (IN HOUSE-CHCC): CEA (CHCC-In House): 1.21 ng/mL (ref 0.00–5.00)

## 2020-06-20 MED ORDER — LEUCOVORIN CALCIUM INJECTION 350 MG
400.0000 mg/m2 | Freq: Once | INTRAVENOUS | Status: AC
  Administered 2020-06-20: 864 mg via INTRAVENOUS
  Filled 2020-06-20: qty 43.2

## 2020-06-20 MED ORDER — DEXTROSE 5 % IV SOLN
Freq: Once | INTRAVENOUS | Status: AC
  Filled 2020-06-20: qty 250

## 2020-06-20 MED ORDER — SODIUM CHLORIDE 0.9 % IV SOLN
Freq: Once | INTRAVENOUS | Status: DC
  Filled 2020-06-20: qty 250

## 2020-06-20 MED ORDER — FLUOROURACIL CHEMO INJECTION 5 GM/100ML
1920.0000 mg/m2 | INTRAVENOUS | Status: DC
  Administered 2020-06-20: 4150 mg via INTRAVENOUS
  Filled 2020-06-20: qty 83

## 2020-06-20 MED ORDER — SODIUM CHLORIDE 0.9 % IV SOLN
10.0000 mg | Freq: Once | INTRAVENOUS | Status: AC
  Administered 2020-06-20: 10 mg via INTRAVENOUS
  Filled 2020-06-20: qty 10

## 2020-06-20 MED ORDER — PALONOSETRON HCL INJECTION 0.25 MG/5ML
INTRAVENOUS | Status: AC
  Filled 2020-06-20: qty 5

## 2020-06-20 MED ORDER — PALONOSETRON HCL INJECTION 0.25 MG/5ML
0.2500 mg | Freq: Once | INTRAVENOUS | Status: AC
  Administered 2020-06-20: 0.25 mg via INTRAVENOUS

## 2020-06-20 MED ORDER — OXALIPLATIN CHEMO INJECTION 100 MG/20ML
85.0000 mg/m2 | Freq: Once | INTRAVENOUS | Status: AC
  Administered 2020-06-20: 185 mg via INTRAVENOUS
  Filled 2020-06-20: qty 37

## 2020-06-20 MED ORDER — SODIUM CHLORIDE 0.9% FLUSH
10.0000 mL | Freq: Once | INTRAVENOUS | Status: AC
  Administered 2020-06-20: 10 mL via INTRAVENOUS
  Filled 2020-06-20: qty 10

## 2020-06-20 MED ORDER — FLUOROURACIL CHEMO INJECTION 2.5 GM/50ML
400.0000 mg/m2 | Freq: Once | INTRAVENOUS | Status: AC
Start: 2020-06-20 — End: 2020-06-20
  Administered 2020-06-20: 850 mg via INTRAVENOUS
  Filled 2020-06-20: qty 17

## 2020-06-20 NOTE — Patient Instructions (Signed)
Tunneled Central Venous Catheter Flushing Guide  It is important to flush your tunneled central venous catheter each time you use it, both before and after you use it. Flushing your catheter will help prevent it from clogging. What are the risks? Risks may include:  Infection.  Air getting into the catheter and bloodstream. Supplies needed:  A clean pair of gloves.  A disinfecting wipe. Use an alcohol wipe, chlorhexidine wipe, or iodine wipe as told by your health care provider.  A 10 mL syringe that has been prefilled with saline solution.  An empty 10 mL syringe, if a substance called heparin was injected into your catheter. How to flush your catheter When you flush your catheter, make sure you follow any specific instructions from your health care provider or the manufacturer. These are general guidelines. Flushing your catheter before use If there is heparin in your catheter: 1. Wash your hands with soap and water. 2. Put on gloves. 3. Scrub the injection cap for a minimum of 15 seconds with a disinfecting wipe. 4. Unclamp the catheter. 5. Attach the empty syringe to the injection cap. 6. Pull the syringe plunger back and withdraw 10 mL of blood. 7. Place the syringe into an appropriate waste container. 8. Scrub the injection cap for 15 seconds with a disinfecting wipe. 9. Attach the prefilled syringe to the injection cap. 10. Flush the catheter by pushing the plunger forward until all the liquid from the syringe is in the catheter. 11. Remove the syringe from the injection cap. 12. Clamp the catheter. If there is no heparin in your catheter: 1. Wash your hands with soap and water. 2. Put on gloves. 3. Scrub the injection cap for 15 seconds with a disinfecting wipe. 4. Unclamp the catheter. 5. Attach the prefilled syringe to the injection cap. 6. Flush the catheter by pushing the plunger forward until 5 mL of the liquid from the syringe is in the catheter. 7. Pull back on  the syringe until you see blood in the catheter. 8. If you have been asked to collect any blood, follow your health care provider's instructions. Otherwise, flush the catheter with the rest of the solution from the syringe. 9. Remove the syringe from the injection cap. 10. Clamp the catheter.   Flushing your catheter after use 1. Wash your hands with soap and water. 2. Put on gloves. 3. Scrub the injection cap for 15 seconds with a disinfecting wipe. 4. Unclamp the catheter. 5. Attach the prefilled syringe to the injection cap. 6. Flush the catheter by pushing the plunger forward until all of the liquid from the syringe is in the catheter. 7. Remove the syringe from the injection cap. 8. Clamp the catheter. Problems and solutions  If blood cannot be completely cleared from the injection cap, you may need to have the injection cap replaced.  If the catheter is difficult to flush, use the pulsing method. The pulsing method involves pushing only a few milliliters of solution into the catheter at a time and pausing between pushes.  If you do not see blood in the catheter when you pull back on the syringe, change your body position, such as by raising your arms above your head. Take a deep breath and cough. Then, pull back on the syringe. If you still do not see blood, flush the catheter with a small amount of solution. Then, change positions again and take a breath or cough. Pull back on the syringe again. If you still do not   see blood, finish flushing the catheter and contact your health care provider. Do not use your catheter until your health care provider says it is okay. General tips  Have someone help you flush your catheter, if possible.  Do not force fluid through your catheter.  Do not use a syringe that is larger or smaller than 10 mL. Using a smaller syringe can make the catheter burst.  Do not use your catheter without flushing it first if it has heparin in it. Contact a health  care provider if:  You cannot see any blood in the catheter when you flush it before using it.  Your catheter is difficult to flush. Get help right away if:  You cannot flush the catheter.  The catheter leaks when you flush it or when there is fluid in it.  There are cracks or breaks in the catheter. Summary  It is important to flush your tunneled central venous catheter each time you use it, both before and after you use it.  Scrub the injection cap for 15 seconds with a disinfecting wipe before and after you flush it.  When you flush your catheter, make sure you follow any specific instructions from your health care provider or the manufacturer.  Get help right away if you cannot flush the catheter. This information is not intended to replace advice given to you by your health care provider. Make sure you discuss any questions you have with your health care provider. Document Revised: 06/25/2019 Document Reviewed: 07/02/2018 Elsevier Patient Education  2021 [employer] Inc.  

## 2020-06-20 NOTE — Patient Instructions (Signed)
[facility] Discharge Instructions for Patients Receiving Chemotherapy  Today you received the following chemotherapy agents Folox  To help prevent nausea and vomiting after your treatment, we encourage you to take your nausea medication as directed  If you develop nausea and vomiting that is not controlled by your nausea medication, call the clinic.   BELOW ARE SYMPTOMS THAT SHOULD BE REPORTED IMMEDIATELY:  *FEVER GREATER THAN 100.5 F  *CHILLS WITH OR WITHOUT FEVER  NAUSEA AND VOMITING THAT IS NOT CONTROLLED WITH YOUR NAUSEA MEDICATION  *UNUSUAL SHORTNESS OF BREATH  *UNUSUAL BRUISING OR BLEEDING  TENDERNESS IN MOUTH AND THROAT WITH OR WITHOUT PRESENCE OF ULCERS  *URINARY PROBLEMS  *BOWEL PROBLEMS  UNUSUAL RASH Items with * indicate a potential emergency and should be followed up as soon as possible.  Feel free to call the clinic should you have any questions or concerns. The clinic phone number is [phone].  Please show the [facility] at check-in to the Emergency Department and triage nurse.

## 2020-06-20 NOTE — Telephone Encounter (Signed)
appts made per 06/20/20 los and pt will rec sch in tx/avs     John Douglas

## 2020-06-20 NOTE — Progress Notes (Signed)
Radiation Oncology         [phone] ________________________________  Name: John Douglas MRN: [id number]  Date: 06/20/2020  DOB: [birth year]  Follow-Up Visit Note  CC: Hamrick, Lorin Mercy, MD  Volanda Napoleon, MD    ICD-10-CM   1. Rectal cancer ([place])  C20     Diagnosis: Stage T4a, N2 metastatic adenocarcinoma of the rectum  Interval Since Last Radiation: One month, three weeks, and two days  Radiation Treatment Dates: 04/06/2020 through 04/27/2020  Site / Technique / Total Dose (Gy)  / Dose per Fx (Gy) / Completed Fx / Beam Energies Rectum / 3D. A total of 37.5 Gy given in 2.5 Gy per fraction for a total of 15 fractions. 6X, 15X  Narrative:  The patient returns today for routine follow-up. He was last seen by Dr. Marin Olp this morning and will undergo his 10th cycle of chemotherapy with Folfox.  On review of systems, he reports some bowel urgency and urinary urgency after his radiation therapy but these problems have cleared up. He denies significant problems with diarrhea.  He denies any rectal bleeding.  ALLERGIES:  is allergic to celexa [citalopram], ibuprofen, metformin and related, naproxen, yellow jacket venom [bee venom], and penicillins.  Meds: Current Outpatient Medications  Medication Sig Dispense Refill  . apixaban (ELIQUIS) 5 MG TABS tablet Take 1 tablet (5 mg total) by mouth 2 (two) times daily. 60 tablet 5  . diltiazem (CARDIZEM CD) 180 MG 24 hr capsule TAKE 1 CAPSULE BY MOUTH EVERY DAY 30 capsule 6  . ezetimibe (ZETIA) 10 MG tablet Take 1 tablet by mouth once daily 90 tablet 3  . furosemide (LASIX) 40 MG tablet Take 1 tablet (40 mg total) by mouth daily. 30 tablet 2  . gabapentin (NEURONTIN) 100 MG capsule TAKE 1 CAPSULE BY MOUTH AT BEDTIME. (Patient taking differently: Take 100 mg by mouth at bedtime.) 30 capsule 3  . glucose blood test strip Monitor BS before meals and at bedtime for a couple of weeks. Then may decrease to bid before meals 100 each 12  .  Lancets (ONETOUCH ULTRASOFT) lancets Use as instructed 100 each 12  . lisinopril (ZESTRIL) 2.5 MG tablet Take 1 tablet by mouth daily.    . metoprolol succinate (TOPROL-XL) 100 MG 24 hr tablet TAKE 1 TABLET BY MOUTH TWICE A DAY IN MORNING AND BEDTIME 180 tablet 0  . potassium chloride SA (KLOR-CON) 20 MEQ tablet Take 1 tablet (20 mEq total) by mouth daily. 30 tablet 2  . rosuvastatin (CRESTOR) 20 MG tablet TAKE 1 TABLET BY MOUTH EVERY DAY 90 tablet 2  . sertraline (ZOLOFT) 50 MG tablet Take 1 tablet (50 mg total) by mouth daily. 90 tablet 0  . capecitabine (XELODA) 500 MG tablet Take 4 tablets (2,000 mg total) by mouth daily. Take Xeloda on Monday thru Friday for 6 weeks.Start 2 days before radiation therapy starts (Patient not taking: No sig reported) 120 tablet 0  . nitroGLYCERIN (NITROSTAT) 0.4 MG SL tablet Place 0.4 mg under the tongue every 5 (five) minutes as needed for chest pain.      No current facility-administered medications for this encounter.    Physical Findings: The patient is in no acute distress. Patient is alert and oriented.  temporal temperature is 96.9 F (36.1 C) (abnormal). His blood pressure is 119/89 and his pulse is 77. His respiration is 18 and oxygen saturation is 98%.  No significant changes. Lungs are clear to auscultation bilaterally. Heart has regular rate and rhythm.  No palpable cervical, supraclavicular, or axillary adenopathy. Abdomen soft, non-tender, normal bowel sounds.   Lab Findings: Lab Results  Component Value Date   WBC 7.4 06/20/2020   HGB 13.1 06/20/2020   HCT 36.9 (L) 06/20/2020   MCV 92.0 06/20/2020   PLT 224 06/20/2020    Radiographic Findings: No results found.  Impression: Stage T4a, N2 metastatic adenocarcinoma of the rectum  The patient has recovered well from his radiation therapy to the pelvis.  He seems to be tolerating additional chemotherapy well thus far.  Plan: The patient is scheduled to follow up with Dr. Marin Olp on  07/04/2020. He will follow up with radiation oncology in as needed basis in light of the patient's close follow-up with medical oncology.    ____________________________________   Blair Promise, PhD, MD  This document serves as a record of services personally performed by Gery Pray, MD. It was created on his behalf by Clerance Lav, a trained medical scribe. The creation of this record is based on the scribe's personal observations and the provider's statements to them. This document has been checked and approved by the attending provider.

## 2020-06-20 NOTE — Progress Notes (Incomplete)
  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 A total of 37.5 Gy given in 2.5 Gy per fraction for a total of 15 fractions. 6X, 15X  Narrative: The patient tolerated radiation therapy quite well. He did report occasional loose stools, mild fatigue, and urinary urgency.  Plan: The patient will follow-up with radiation oncology in one month.  ________________________________________________   Blair Promise, PhD, MD  This document serves as a record of services personally performed by Gery Pray, MD. It was created on his behalf by Clerance Lav, a trained medical scribe. The creation of this record is based on the scribe's personal observations and the provider's statements to them. This document has been checked and approved by the attending provider.

## 2020-06-20 NOTE — Progress Notes (Signed)
Patient is here today for follow up for radiation to rectal area on 04/27/2020.  He denies having any pain.  Denies nausea/vomiting.  Denies diarrhea/constipation.  Patient states skin in the rectal area is fine.  He reports bowel urgency.  Denies any urinary/bowel incontinence.  Denies any issues with bladder.  Denies any blood in stool.  Vitals:   06/20/20 1538  BP: 119/89  Pulse: 77  Resp: 18  Temp: (!) 96.9 F (36.1 C)  TempSrc: Temporal  SpO2: 98%

## 2020-06-20 NOTE — Progress Notes (Signed)
Hematology and Oncology Follow Up Visit  John Douglas [id number] [birth year] 65 y.o. 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 twice daily   Aspirin 81 mg p.o. daily  FOLFOX --  S/p cycle #9-- started on 11/17/2019  XRT/Xeloda -- start on 04/01/2020 -- completed 04/27/2020     Interim History:  John Douglas is in for follow-up.  So far, everything is doing pretty well.  He had a good weekend.  He apparently had the COVID about a month ago.  He says he feels better but he still has a little bit of "fogginess" with respect to his thinking.  I told him that this may last for several months, particularly since he is also getting chemotherapy.  He has had no issues with his heart.  He does have atrial fibrillation.  I think that the cardiologist wants to cardiovert him.  I certainly would have no problems with him being cardioverted.  He has had no issues with cough or shortness of breath.  He has had no problems with bleeding.  His last CEA level was 1.9.    Overall, his performance status is ECOG 1.    Medications:  Current Outpatient Medications:  .  lisinopril (ZESTRIL) 2.5 MG tablet, Take 1 tablet by mouth daily., Disp: , Rfl:  .  apixaban (ELIQUIS) 5 MG TABS tablet, Take 1 tablet (5 mg total) by mouth 2 (two) times daily., Disp: 60 tablet, Rfl: 5 .  capecitabine (XELODA) 500 MG tablet, Take 4 tablets (2,000 mg total) by mouth daily. Take Xeloda on Monday thru Friday for 6 weeks.Start 2 days before radiation therapy starts (Patient not taking: Reported on 05/18/2020), Disp: 120 tablet, Rfl: 0 .  diltiazem (CARDIZEM CD) 180 MG 24 hr capsule, TAKE 1 CAPSULE BY MOUTH EVERY DAY, Disp: 30 capsule, Rfl: 6 .  ezetimibe (ZETIA) 10 MG tablet, TAKE 1 TABLET BY MOUTH EVERY DAY, Disp: 90 tablet, Rfl: 0 .  furosemide  (LASIX) 40 MG tablet, Take 1 tablet (40 mg total) by mouth daily., Disp: 30 tablet, Rfl: 2 .  gabapentin (NEURONTIN) 100 MG capsule, TAKE 1 CAPSULE BY MOUTH AT BEDTIME. (Patient taking differently: Take 100 mg by mouth at bedtime.), Disp: 30 capsule, Rfl: 3 .  glucose blood test strip, Monitor BS before meals and at bedtime for a couple of weeks. Then may decrease to bid before meals, Disp: 100 each, Rfl: 12 .  Lancets (ONETOUCH ULTRASOFT) lancets, Use as instructed, Disp: 100 each, Rfl: 12 .  metoprolol succinate (TOPROL-XL) 100 MG 24 hr tablet, TAKE 1 TABLET BY MOUTH TWICE A DAY IN MORNING AND BEDTIME, Disp: 180 tablet, Rfl: 0 .  nitroGLYCERIN (NITROSTAT) 0.4 MG SL tablet, Place 0.4 mg under the tongue every 5 (five) minutes as needed for chest pain. , Disp: , Rfl:  .  potassium chloride SA (KLOR-CON) 20 MEQ tablet, Take 1 tablet (20 mEq total) by mouth daily., Disp: 30 tablet, Rfl: 2 .  rosuvastatin (CRESTOR) 20 MG tablet, Take 1 tablet (20 mg total) by mouth daily. (Patient taking differently: Take 20 mg by mouth at bedtime.), Disp: 30 tablet, Rfl: 0 .  sertraline (ZOLOFT) 50 MG tablet, Take 1 tablet (50 mg total) by mouth daily., Disp: 90 tablet, Rfl: 0  Allergies:  Allergies  Allergen Reactions  . Celexa [Citalopram] Other (See Comments)  Contraindicated with A-Fib  . Ibuprofen Other (See Comments)    Was told to not take this   . Metformin And Related Other (See Comments)    Bloody stools   . Naproxen Other (See Comments)    Was told to not take this   . Yellow Jacket Venom [Bee Venom] Swelling and Other (See Comments)    Severe swelling where stung  . Penicillins Hives    Did it involve swelling of the face/tongue/throat, SOB, or low BP? Unk Did it involve sudden or severe rash/hives, skin peeling, or any reaction on the inside of your mouth or nose? Yes Did you need to seek medical attention at a hospital or doctor's office? Unk When did it last happen?"Childhood- 55 years  ago" If all above answers are "NO", may proceed with cephalosporin use.     Past Medical History, Surgical history, Social history, and Family History were reviewed and updated.  Review of Systems: Review of Systems  Constitutional: Negative.   HENT:  Negative.   Eyes: Negative.   Respiratory: Negative.   Cardiovascular: Positive for palpitations.  Gastrointestinal: Positive for blood in stool.  Endocrine: Negative.   Genitourinary: Negative.    Skin: Negative.   Neurological: Positive for extremity weakness.  Hematological: Negative.   Psychiatric/Behavioral: Negative.     Physical Exam:  weight is 228 lb (103.4 kg). His oral temperature is 98.1 F (36.7 C). His blood pressure is 109/66 and his pulse is 69. His respiration is 18 and oxygen saturation is 98%.   Wt Readings from Last 3 Encounters:  06/20/20 228 lb (103.4 kg)  05/18/20 227 lb 4 oz (103.1 kg)  05/09/20 224 lb 4 oz (101.7 kg)    Physical Exam Vitals reviewed.  HENT:     Head: Normocephalic and atraumatic.  Eyes:     Pupils: Pupils are equal, round, and reactive to light.  Cardiovascular:     Rate and Rhythm: Normal rate and regular rhythm.     Heart sounds: Normal heart sounds.     Comments: Cardiac exam shows an irregular rate and irregular rhythm consistent with atrial fibrillation.  The rate is fairly well controlled.   There are no murmurs, rubs or bruits. Pulmonary:     Effort: Pulmonary effort is normal.     Breath sounds: Normal breath sounds.  Abdominal:     General: Bowel sounds are normal.     Palpations: Abdomen is soft.     Comments: Abdominal exam is soft.  He has decent bowel sounds.  There is no guarding or rebound tenderness.  There is no fluid wave.  There is no palpable liver or spleen tip.  Musculoskeletal:        General: No tenderness or deformity. Normal range of motion.     Cervical back: Normal range of motion.  Lymphadenopathy:     Cervical: No cervical adenopathy.  Skin:     General: Skin is warm and dry.     Findings: No erythema or rash.  Neurological:     Mental Status: He is alert and oriented to person, place, and time.  Psychiatric:        Behavior: Behavior normal.        Thought Content: Thought content normal.        Judgment: Judgment normal.      Lab Results  Component Value Date   WBC 7.4 06/20/2020   HGB 13.1 06/20/2020   HCT 36.9 (L) 06/20/2020   MCV 92.0 06/20/2020  PLT 224 06/20/2020     Chemistry      Component Value Date/Time   NA 141 05/09/2020 1001   NA 140 08/28/2019 0940   K 3.5 05/09/2020 1001   CL 104 05/09/2020 1001   CO2 27 05/09/2020 1001   BUN 29 (H) 05/09/2020 1001   BUN 21 08/28/2019 0940   CREATININE 1.17 05/09/2020 1001      Component Value Date/Time   CALCIUM 9.3 05/09/2020 1001   ALKPHOS 55 05/09/2020 1001   AST 19 05/09/2020 1001   ALT 16 05/09/2020 1001   BILITOT 1.1 05/09/2020 1001      Impression and Plan: Mr. Loescher is a very nice 65 year old white male.  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  He had a rectal bleeding.  He subsequently was found to have rectal cancer.  He had adenopathy that was distant to the rectum.  He is on treatment with chemotherapy after his chemoradiation therapy.  We will proceed with his 10th cycle of treatment.  I will try to hold off on any scans till after we complete all of his therapy.    I will have him come back to see [place] in another couple weeks.   Marland Kitchen    Volanda Napoleon, MD 2/21/20229:34 AM

## 2020-06-21 ENCOUNTER — Telehealth: Payer: Self-pay | Admitting: Internal Medicine

## 2020-06-21 ENCOUNTER — Encounter: Payer: Self-pay | Admitting: *Deleted

## 2020-06-21 DIAGNOSIS — I4892 Unspecified atrial flutter: Secondary | ICD-10-CM

## 2020-06-21 DIAGNOSIS — I4891 Unspecified atrial fibrillation: Secondary | ICD-10-CM

## 2020-06-21 MED ORDER — DILTIAZEM HCL ER COATED BEADS 180 MG PO CP24: 180.0000 mg | ORAL_CAPSULE | Freq: Every day | ORAL | 1 refills | Status: DC

## 2020-06-21 NOTE — Telephone Encounter (Signed)
*  STAT* If patient is at the pharmacy, call can be transferred to refill team.   1. Which medications need to be refilled? (please list name of each medication and dose if known) diltiazem   2. Which pharmacy/location (including street and city if local pharmacy) is medication to be sent to? [facility] [place]  3. Do they need a 30 day or 90 day supply? [place]

## 2020-06-21 NOTE — Progress Notes (Signed)
Oncology Nurse Navigator Documentation  Oncology Nurse Navigator Flowsheets 06/21/2020  Abnormal Finding Date -  Confirmed Diagnosis Date -  Planned Course of Treatment -  Phase of Treatment -  Chemotherapy Actual Start Date: -  Chemotherapy Actual End Date: -  Chemo/Radiation Concurrent Actual Start Date: -  Chemo/Radiation Concurrent Expected End Date: -  Navigator Follow Up Date: 07/04/2020  Navigator Follow Up Reason: Follow-up Appointment;Chemotherapy  Navigator Location [facility]  Navigator Encounter Type Appt/Treatment Plan Review  Treatment Initiated Date -  Patient Visit Type MedOnc  Treatment Phase Active Tx  Barriers/Navigation Needs Coordination of Care;Education  Education -  Interventions None Required  Acuity Level 2-Minimal Needs (1-2 Barriers Identified)  Referrals -  Coordination of Care -  Education Method -  Support Groups/Services Friends and Family  Time Spent with Patient 15

## 2020-06-21 NOTE — Telephone Encounter (Signed)
Rx request sent to pharmacy.  

## 2020-06-22 ENCOUNTER — Inpatient Hospital Stay: Payer: Medicare HMO

## 2020-06-22 ENCOUNTER — Other Ambulatory Visit: Payer: Self-pay

## 2020-06-22 VITALS — BP 113/69 | HR 73 | Temp 98.3°F | Resp 17

## 2020-06-22 DIAGNOSIS — Z5111 Encounter for antineoplastic chemotherapy: Secondary | ICD-10-CM | POA: Diagnosis not present

## 2020-06-22 DIAGNOSIS — C20 Malignant neoplasm of rectum: Secondary | ICD-10-CM

## 2020-06-22 MED ORDER — SODIUM CHLORIDE 0.9% FLUSH
10.0000 mL | INTRAVENOUS | Status: DC | PRN
  Administered 2020-06-22: 10 mL
  Filled 2020-06-22: qty 10

## 2020-06-22 MED ORDER — HEPARIN SOD (PORK) LOCK FLUSH 100 UNIT/ML IV SOLN
500.0000 [IU] | Freq: Once | INTRAVENOUS | Status: AC | PRN
  Administered 2020-06-22: 500 [IU]
  Filled 2020-06-22: qty 5

## 2020-06-22 NOTE — Patient Instructions (Signed)

## 2020-06-28 ENCOUNTER — Other Ambulatory Visit: Payer: Self-pay | Admitting: Internal Medicine

## 2020-07-04 ENCOUNTER — Inpatient Hospital Stay: Payer: Medicare HMO | Admitting: Hematology & Oncology

## 2020-07-04 ENCOUNTER — Inpatient Hospital Stay: Payer: Medicare HMO | Attending: Hematology & Oncology

## 2020-07-04 ENCOUNTER — Inpatient Hospital Stay: Payer: Medicare HMO

## 2020-07-04 ENCOUNTER — Other Ambulatory Visit: Payer: Self-pay

## 2020-07-04 ENCOUNTER — Encounter: Payer: Self-pay | Admitting: Hematology & Oncology

## 2020-07-04 VITALS — BP 122/65 | HR 53 | Temp 98.7°F | Resp 17 | Wt 224.0 lb

## 2020-07-04 DIAGNOSIS — Z8673 Personal history of transient ischemic attack (TIA), and cerebral infarction without residual deficits: Secondary | ICD-10-CM | POA: Diagnosis not present

## 2020-07-04 DIAGNOSIS — I4891 Unspecified atrial fibrillation: Secondary | ICD-10-CM | POA: Insufficient documentation

## 2020-07-04 DIAGNOSIS — C20 Malignant neoplasm of rectum: Secondary | ICD-10-CM

## 2020-07-04 DIAGNOSIS — Z7982 Long term (current) use of aspirin: Secondary | ICD-10-CM | POA: Diagnosis not present

## 2020-07-04 DIAGNOSIS — Z5111 Encounter for antineoplastic chemotherapy: Secondary | ICD-10-CM | POA: Diagnosis present

## 2020-07-04 DIAGNOSIS — Z452 Encounter for adjustment and management of vascular access device: Secondary | ICD-10-CM | POA: Diagnosis not present

## 2020-07-04 DIAGNOSIS — D500 Iron deficiency anemia secondary to blood loss (chronic): Secondary | ICD-10-CM

## 2020-07-04 DIAGNOSIS — Z7901 Long term (current) use of anticoagulants: Secondary | ICD-10-CM | POA: Insufficient documentation

## 2020-07-04 DIAGNOSIS — D509 Iron deficiency anemia, unspecified: Secondary | ICD-10-CM | POA: Diagnosis not present

## 2020-07-04 LAB — CBC WITH DIFFERENTIAL (CANCER CENTER ONLY)
Abs Immature Granulocytes: 0.02 10*3/uL (ref 0.00–0.07)
Basophils Absolute: 0 10*3/uL (ref 0.0–0.1)
Basophils Relative: 1 %
Eosinophils Absolute: 0.1 10*3/uL (ref 0.0–0.5)
Eosinophils Relative: 2 %
HCT: 30.5 % — ABNORMAL LOW (ref 39.0–52.0)
Hemoglobin: 10.7 g/dL — ABNORMAL LOW (ref 13.0–17.0)
Immature Granulocytes: 0 %
Lymphocytes Relative: 16 %
Lymphs Abs: 1.1 10*3/uL (ref 0.7–4.0)
MCH: 32.3 pg (ref 26.0–34.0)
MCHC: 35.1 g/dL (ref 30.0–36.0)
MCV: 92.1 fL (ref 80.0–100.0)
Monocytes Absolute: 0.5 10*3/uL (ref 0.1–1.0)
Monocytes Relative: 7 %
Neutro Abs: 4.9 10*3/uL (ref 1.7–7.7)
Neutrophils Relative %: 74 %
Platelet Count: 134 10*3/uL — ABNORMAL LOW (ref 150–400)
RBC: 3.31 MIL/uL — ABNORMAL LOW (ref 4.22–5.81)
RDW: 15.5 % (ref 11.5–15.5)
WBC Count: 6.6 10*3/uL (ref 4.0–10.5)
nRBC: 0 % (ref 0.0–0.2)

## 2020-07-04 LAB — IRON AND TIBC
Iron: 60 ug/dL (ref 42–163)
Saturation Ratios: 19 % — ABNORMAL LOW (ref 20–55)
TIBC: 315 ug/dL (ref 202–409)
UIBC: 255 ug/dL (ref 117–376)

## 2020-07-04 LAB — CMP (CANCER CENTER ONLY)
ALT: 10 U/L (ref 0–44)
AST: 11 U/L — ABNORMAL LOW (ref 15–41)
Albumin: 4.1 g/dL (ref 3.5–5.0)
Alkaline Phosphatase: 61 U/L (ref 38–126)
Anion gap: 8 (ref 5–15)
BUN: 21 mg/dL (ref 8–23)
CO2: 31 mmol/L (ref 22–32)
Calcium: 9.3 mg/dL (ref 8.9–10.3)
Chloride: 100 mmol/L (ref 98–111)
Creatinine: 1.14 mg/dL (ref 0.61–1.24)
GFR, Estimated: >60 mL/min (ref >60)
Glucose, Bld: 294 mg/dL — ABNORMAL HIGH (ref 70–99)
Potassium: 3.2 mmol/L — ABNORMAL LOW (ref 3.5–5.1)
Sodium: 139 mmol/L (ref 135–145)
Total Bilirubin: 1.1 mg/dL (ref 0.3–1.2)
Total Protein: 6.2 g/dL — ABNORMAL LOW (ref 6.5–8.1)

## 2020-07-04 LAB — CEA (IN HOUSE-CHCC): CEA (CHCC-In House): 1.75 ng/mL (ref 0.00–5.00)

## 2020-07-04 LAB — FERRITIN: Ferritin: 300 ng/mL (ref 24–336)

## 2020-07-04 MED ORDER — DEXTROSE 5 % IV SOLN
Freq: Once | INTRAVENOUS | Status: AC
  Filled 2020-07-04: qty 250

## 2020-07-04 MED ORDER — HEPARIN SOD (PORK) LOCK FLUSH 100 UNIT/ML IV SOLN
500.0000 [IU] | Freq: Once | INTRAVENOUS | Status: DC | PRN
  Filled 2020-07-04: qty 5

## 2020-07-04 MED ORDER — LEUCOVORIN CALCIUM INJECTION 350 MG
400.0000 mg/m2 | Freq: Once | INTRAVENOUS | Status: AC
  Administered 2020-07-04: 864 mg via INTRAVENOUS
  Filled 2020-07-04: qty 43.2

## 2020-07-04 MED ORDER — PALONOSETRON HCL INJECTION 0.25 MG/5ML
0.2500 mg | Freq: Once | INTRAVENOUS | Status: AC
  Administered 2020-07-04: 0.25 mg via INTRAVENOUS

## 2020-07-04 MED ORDER — SODIUM CHLORIDE 0.9 % IV SOLN
10.0000 mg | Freq: Once | INTRAVENOUS | Status: AC
  Administered 2020-07-04: 10 mg via INTRAVENOUS
  Filled 2020-07-04: qty 10

## 2020-07-04 MED ORDER — PALONOSETRON HCL INJECTION 0.25 MG/5ML
INTRAVENOUS | Status: AC
  Filled 2020-07-04: qty 5

## 2020-07-04 MED ORDER — SODIUM CHLORIDE 0.9% FLUSH
10.0000 mL | INTRAVENOUS | Status: DC | PRN
  Filled 2020-07-04: qty 10

## 2020-07-04 MED ORDER — SODIUM CHLORIDE 0.9 % IV SOLN
Freq: Once | INTRAVENOUS | Status: DC
  Filled 2020-07-04: qty 250

## 2020-07-04 MED ORDER — FLUOROURACIL CHEMO INJECTION 2.5 GM/50ML
400.0000 mg/m2 | Freq: Once | INTRAVENOUS | Status: AC
  Administered 2020-07-04: 850 mg via INTRAVENOUS
  Filled 2020-07-04: qty 17

## 2020-07-04 MED ORDER — OXALIPLATIN CHEMO INJECTION 100 MG/20ML
85.0000 mg/m2 | Freq: Once | INTRAVENOUS | Status: AC
  Administered 2020-07-04: 185 mg via INTRAVENOUS
  Filled 2020-07-04: qty 37

## 2020-07-04 MED ORDER — SODIUM CHLORIDE 0.9 % IV SOLN
1920.0000 mg/m2 | INTRAVENOUS | Status: DC
  Administered 2020-07-04: 4150 mg via INTRAVENOUS
  Filled 2020-07-04: qty 83

## 2020-07-04 NOTE — Patient Instructions (Signed)
[facility] Discharge Instructions for Patients Receiving Chemotherapy  Today you received the following chemotherapy agents Oxaliplatin, 5FU, Leucovorin  To help prevent nausea and vomiting after your treatment, we encourage you to take your nausea medication    If you develop nausea and vomiting that is not controlled by your nausea medication, call the clinic.   BELOW ARE SYMPTOMS THAT SHOULD BE REPORTED IMMEDIATELY:  *FEVER GREATER THAN 100.5 F  *CHILLS WITH OR WITHOUT FEVER  NAUSEA AND VOMITING THAT IS NOT CONTROLLED WITH YOUR NAUSEA MEDICATION  *UNUSUAL SHORTNESS OF BREATH  *UNUSUAL BRUISING OR BLEEDING  TENDERNESS IN MOUTH AND THROAT WITH OR WITHOUT PRESENCE OF ULCERS  *URINARY PROBLEMS  *BOWEL PROBLEMS  UNUSUAL RASH Items with * indicate a potential emergency and should be followed up as soon as possible.  Feel free to call the clinic should you have any questions or concerns. The clinic phone number is [phone].  Please show the [facility] at check-in to the Emergency Department and triage nurse.

## 2020-07-04 NOTE — Progress Notes (Signed)
Hematology and Oncology Follow Up Visit  John Douglas [id number] 11-11-1955 65 y.o. 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 twice daily   Aspirin 81 mg p.o. daily  FOLFOX --  S/p cycle #9-- started on 11/17/2019  XRT/Xeloda -- start on 04/01/2020 -- completed 04/27/2020     Interim History:  [name] is in for follow-up.  So far, everything is doing pretty well.  He had a good weekend.  He apparently had the COVID about a month ago.  He says he feels better but he still has a little bit of "fogginess" with respect to his thinking.  I told him that this may last for several months, particularly since he is also getting chemotherapy.  He has had no issues with his heart.  He does have atrial fibrillation.  I think that the cardiologist wants to cardiovert him.  I certainly would have no problems with him being cardioverted.  His blood sugars are quite high.  They were 294 today.  He said he was on glipizide but was taken off this.  He will go back to see his doctor about all this.  He has had no issues with cough or shortness of breath.  He has had no problems with bleeding.  His last CEA level was 1.2.    Overall, his performance status is ECOG 1.    Medications:  Current Outpatient Medications:  .  apixaban (ELIQUIS) 5 MG TABS tablet, Take 1 tablet (5 mg total) by mouth 2 (two) times daily., Disp: 60 tablet, Rfl: 5 .  diltiazem (CARDIZEM CD) 180 MG 24 hr capsule, Take 1 capsule (180 mg total) by mouth daily., Disp: 90 capsule, Rfl: 1 .  ezetimibe (ZETIA) 10 MG tablet, Take 1 tablet by mouth once daily, Disp: 90 tablet, Rfl: 3 .  furosemide (LASIX) 40 MG tablet, TAKE 1 TABLET BY MOUTH EVERY DAY, Disp: 90 tablet, Rfl: 0 .  gabapentin (NEURONTIN) 100 MG capsule, TAKE 1 CAPSULE BY MOUTH AT BEDTIME.  (Patient taking differently: Take 100 mg by mouth at bedtime.), Disp: 30 capsule, Rfl: 3 .  KLOR-CON M20 20 MEQ tablet, TAKE 1 TABLET BY MOUTH EVERY DAY, Disp: 90 tablet, Rfl: 0 .  metoprolol succinate (TOPROL-XL) 100 MG 24 hr tablet, TAKE 1 TABLET BY MOUTH TWICE A DAY IN MORNING AND BEDTIME, Disp: 180 tablet, Rfl: 0 .  rosuvastatin (CRESTOR) 20 MG tablet, TAKE 1 TABLET BY MOUTH EVERY DAY, Disp: 90 tablet, Rfl: 2 .  sertraline (ZOLOFT) 50 MG tablet, Take 1 tablet (50 mg total) by mouth daily., Disp: 90 tablet, Rfl: 0 .  capecitabine (XELODA) 500 MG tablet, Take 4 tablets (2,000 mg total) by mouth daily. Take Xeloda on Monday thru Friday for 6 weeks.Start 2 days before radiation therapy starts (Patient not taking: No sig reported), Disp: 120 tablet, Rfl: 0 .  glucose blood test strip, Monitor BS before meals and at bedtime for a couple of weeks. Then may decrease to bid before meals (Patient not taking: Reported on 07/04/2020), Disp: 100 each, Rfl: 12 .  Lancets (ONETOUCH ULTRASOFT) lancets, Use as instructed (Patient not taking: Reported on 07/04/2020), Disp: 100 each, Rfl: 12 .  nitroGLYCERIN (NITROSTAT) 0.4 MG SL tablet, Place 0.4 mg under the tongue every 5 (five) minutes as needed for chest pain. , Disp: , Rfl:  Allergies:  Allergies  Allergen Reactions  . Celexa [Citalopram] Other (See Comments)    Contraindicated with A-Fib  . Ibuprofen Other (See Comments)    Was told to not take this   . Metformin And Related Other (See Comments)    Bloody stools   . Naproxen Other (See Comments)    Was told to not take this   . Yellow Jacket Venom [Bee Venom] Swelling and Other (See Comments)    Severe swelling where stung  . Penicillins Hives    Did it involve swelling of the face/tongue/throat, SOB, or low BP? Unk Did it involve sudden or severe rash/hives, skin peeling, or any reaction on the inside of your mouth or nose? Yes Did you need to seek medical attention at a hospital or doctor's office?  Unk When did it last happen?"Childhood- 55 years ago" If all above answers are "NO", may proceed with cephalosporin use.     Past Medical History, Surgical history, Social history, and Family History were reviewed and updated.  Review of Systems: Review of Systems  Constitutional: Negative.   HENT:  Negative.   Eyes: Negative.   Respiratory: Negative.   Cardiovascular: Positive for palpitations.  Gastrointestinal: Positive for blood in stool.  Endocrine: Negative.   Genitourinary: Negative.    Skin: Negative.   Neurological: Positive for extremity weakness.  Hematological: Negative.   Psychiatric/Behavioral: Negative.     Physical Exam:  weight is 224 lb (101.6 kg). His oral temperature is 98.7 F (37.1 C). His blood pressure is 122/65 and his pulse is 53 (abnormal). His respiration is 17 and oxygen saturation is 100%.   Wt Readings from Last 3 Encounters:  07/04/20 224 lb (101.6 kg)  06/20/20 228 lb (103.4 kg)  05/18/20 227 lb 4 oz (103.1 kg)    Physical Exam Vitals reviewed.  HENT:     Head: Normocephalic and atraumatic.  Eyes:     Pupils: Pupils are equal, round, and reactive to light.  Cardiovascular:     Rate and Rhythm: Normal rate and regular rhythm.     Heart sounds: Normal heart sounds.     Comments: Cardiac exam shows an irregular rate and irregular rhythm consistent with atrial fibrillation.  The rate is fairly well controlled.   There are no murmurs, rubs or bruits. Pulmonary:     Effort: Pulmonary effort is normal.     Breath sounds: Normal breath sounds.  Abdominal:     General: Bowel sounds are normal.     Palpations: Abdomen is soft.     Comments: Abdominal exam is soft.  He has decent bowel sounds.  There is no guarding or rebound tenderness.  There is no fluid wave.  There is no palpable liver or spleen tip.  Musculoskeletal:        General: No tenderness or deformity. Normal range of motion.     Cervical back: Normal range of motion.   Lymphadenopathy:     Cervical: No cervical adenopathy.  Skin:    General: Skin is warm and dry.     Findings: No erythema or rash.  Neurological:     Mental Status: He is alert and oriented to person, place, and time.  Psychiatric:        Behavior: Behavior normal.        Thought Content: Thought content normal.        Judgment: Judgment normal.      Lab Results  Component Value Date   WBC 6.6 07/04/2020  HGB 10.7 (L) 07/04/2020   HCT 30.5 (L) 07/04/2020   MCV 92.1 07/04/2020   PLT 134 (L) 07/04/2020     Chemistry      Component Value Date/Time   NA 139 07/04/2020 0909   NA 140 08/28/2019 0940   K 3.2 (L) 07/04/2020 0909   CL 100 07/04/2020 0909   CO2 31 07/04/2020 0909   BUN 21 07/04/2020 0909   BUN 21 08/28/2019 0940   CREATININE 1.14 07/04/2020 0909      Component Value Date/Time   CALCIUM 9.3 07/04/2020 0909   ALKPHOS 61 07/04/2020 0909   AST 11 (L) 07/04/2020 0909   ALT 10 07/04/2020 0909   BILITOT 1.1 07/04/2020 0909      Impression and Plan: Mr. Menna is a very nice 65 year old white male.  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  He had a rectal bleeding.  He subsequently was found to have rectal cancer.  He had adenopathy that was distant to the rectum.  He is on treatment with chemotherapy after his chemoradiation therapy.  We will proceed with his 10th cycle of treatment.  I will try to hold off on any scans till after we complete all of his therapy.    I will have him come back to see [place] in another couple weeks.   Marland Kitchen    Volanda Napoleon, MD 3/7/202210:03 AM

## 2020-07-04 NOTE — Patient Instructions (Signed)
Implanted Port Insertion, Care After This sheet gives you information about how to care for yourself after your procedure. Your health care provider may also give you more specific instructions. If you have problems or questions, contact your health care provider. What can I expect after the procedure? After the procedure, it is common to have:  Discomfort at the port insertion site.  Bruising on the skin over the port. This should improve over 3-4 days. Follow these instructions at home: Port care  After your port is placed, you will get a manufacturer's information card. The card has information about your port. Keep this card with you at all times.  Take care of the port as told by your health care provider. Ask your health care provider if you or a family member can get training for taking care of the port at home. A home health care nurse may also take care of the port.  Make sure to remember what type of port you have. Incision care  Follow instructions from your health care provider about how to take care of your port insertion site. Make sure you: ? Wash your hands with soap and water before and after you change your bandage (dressing). If soap and water are not available, use hand sanitizer. ? Change your dressing as told by your health care provider. ? Leave stitches (sutures), skin glue, or adhesive strips in place. These skin closures may need to stay in place for 2 weeks or longer. If adhesive strip edges start to loosen and curl up, you may trim the loose edges. Do not remove adhesive strips completely unless your health care provider tells you to do that.  Check your port insertion site every day for signs of infection. Check for: ? Redness, swelling, or pain. ? Fluid or blood. ? Warmth. ? Pus or a bad smell.      Activity  Return to your normal activities as told by your health care provider. Ask your health care provider what activities are safe for you.  Do not  lift anything that is heavier than 10 lb (4.5 kg), or the limit that you are told, until your health care provider says that it is safe. General instructions  Take over-the-counter and prescription medicines only as told by your health care provider.  Do not take baths, swim, or use a hot tub until your health care provider approves. Ask your health care provider if you may take showers. You may only be allowed to take sponge baths.  Do not drive for 24 hours if you were given a sedative during your procedure.  Wear a medical alert bracelet in case of an emergency. This will tell any health care providers that you have a port.  Keep all follow-up visits as told by your health care provider. This is important. Contact a health care provider if:  You cannot flush your port with saline as directed, or you cannot draw blood from the port.  You have a fever or chills.  You have redness, swelling, or pain around your port insertion site.  You have fluid or blood coming from your port insertion site.  Your port insertion site feels warm to the touch.  You have pus or a bad smell coming from the port insertion site. Get help right away if:  You have chest pain or shortness of breath.  You have bleeding from your port that you cannot control. Summary  Take care of the port as told by your   health care provider. Keep the manufacturer's information card with you at all times.  Change your dressing as told by your health care provider.  Contact a health care provider if you have a fever or chills or if you have redness, swelling, or pain around your port insertion site.  Keep all follow-up visits as told by your health care provider. This information is not intended to replace advice given to you by your health care provider. Make sure you discuss any questions you have with your health care provider. Document Revised: 11/12/2017 Document Reviewed: 11/12/2017 Elsevier Patient Education   2021 Elsevier Inc[employer]  

## 2020-07-05 ENCOUNTER — Encounter: Payer: Self-pay | Admitting: *Deleted

## 2020-07-05 NOTE — Progress Notes (Signed)
Oncology Nurse Navigator Documentation  Oncology Nurse Navigator Flowsheets 07/05/2020  Abnormal Finding Date -  Confirmed Diagnosis Date -  Planned Course of Treatment -  Phase of Treatment -  Chemotherapy Actual Start Date: -  Chemotherapy Actual End Date: -  Chemo/Radiation Concurrent Actual Start Date: -  Chemo/Radiation Concurrent Expected End Date: -  Navigator Follow Up Date: -  Navigator Follow Up Reason: -  Navigation Complete Date: 07/05/2020  Post Navigation: Continue to Follow Patient? No  Reason Not Navigating Patient: Patient On Maintenance Chemotherapy  Navigator Location [facility]  Navigator Encounter Type Appt/Treatment Plan Review  Treatment Initiated Date -  Patient Visit Type MedOnc  Treatment Phase Active Tx  Barriers/Navigation Needs Coordination of Care;Education  Education -  Interventions None Required  Acuity Level 1-No Barriers  Referrals -  Coordination of Care -  Education Method -  Support Groups/Services Friends and Family  Time Spent with Patient 15

## 2020-07-06 ENCOUNTER — Other Ambulatory Visit: Payer: Self-pay

## 2020-07-06 ENCOUNTER — Inpatient Hospital Stay: Payer: Medicare HMO

## 2020-07-06 VITALS — BP 148/86 | HR 59 | Temp 97.1°F | Resp 16

## 2020-07-06 DIAGNOSIS — Z5111 Encounter for antineoplastic chemotherapy: Secondary | ICD-10-CM | POA: Diagnosis not present

## 2020-07-06 DIAGNOSIS — C20 Malignant neoplasm of rectum: Secondary | ICD-10-CM

## 2020-07-06 MED ORDER — HEPARIN SOD (PORK) LOCK FLUSH 100 UNIT/ML IV SOLN
500.0000 [IU] | Freq: Once | INTRAVENOUS | Status: AC | PRN
  Administered 2020-07-06: 500 [IU]
  Filled 2020-07-06: qty 5

## 2020-07-06 MED ORDER — SODIUM CHLORIDE 0.9% FLUSH
10.0000 mL | INTRAVENOUS | Status: DC | PRN
  Administered 2020-07-06: 10 mL
  Filled 2020-07-06: qty 10

## 2020-07-06 NOTE — Patient Instructions (Signed)
Fluorouracil, 5-FU injection What is this medicine? FLUOROURACIL, 5-FU (flure oh YOOR a sil) is a chemotherapy drug. It slows the growth of cancer cells. This medicine is used to treat many types of cancer like breast cancer, colon or rectal cancer, pancreatic cancer, and stomach cancer. This medicine may be used for other purposes; ask your health care provider or pharmacist if you have questions. COMMON BRAND NAME(S): Adrucil What should I tell my health care provider before I take this medicine? They need to know if you have any of these conditions:  blood disorders  dihydropyrimidine dehydrogenase (DPD) deficiency  infection (especially a virus infection such as chickenpox, cold sores, or herpes)  kidney disease  liver disease  malnourished, poor nutrition  recent or ongoing radiation therapy  an unusual or allergic reaction to fluorouracil, other chemotherapy, other medicines, foods, dyes, or preservatives  pregnant or trying to get pregnant  breast-feeding How should I use this medicine? This drug is given as an infusion or injection into a vein. It is administered in a hospital or clinic by a specially trained health care professional. Talk to your pediatrician regarding the use of this medicine in children. Special care may be needed. Overdosage: If you think you have taken too much of this medicine contact a poison control center or emergency room at once. NOTE: This medicine is only for you. Do not share this medicine with others. What if I miss a dose? It is important not to miss your dose. Call your doctor or health care professional if you are unable to keep an appointment. What may interact with this medicine? Do not take this medicine with any of the following medications:  live virus vaccines This medicine may also interact with the following medications:  medicines that treat or prevent blood clots like warfarin, enoxaparin, and dalteparin This list may not  describe all possible interactions. Give your health care provider a list of all the medicines, herbs, non-prescription drugs, or dietary supplements you use. Also tell them if you smoke, drink alcohol, or use illegal drugs. Some items may interact with your medicine. What should I watch for while using this medicine? Visit your doctor for checks on your progress. This drug may make you feel generally unwell. This is not uncommon, as chemotherapy can affect healthy cells as well as cancer cells. Report any side effects. Continue your course of treatment even though you feel ill unless your doctor tells you to stop. In some cases, you may be given additional medicines to help with side effects. Follow all directions for their use. Call your doctor or health care professional for advice if you get a fever, chills or sore throat, or other symptoms of a cold or flu. Do not treat yourself. This drug decreases your body's ability to fight infections. Try to avoid being around people who are sick. This medicine may increase your risk to bruise or bleed. Call your doctor or health care professional if you notice any unusual bleeding. Be careful brushing and flossing your teeth or using a toothpick because you may get an infection or bleed more easily. If you have any dental work done, tell your dentist you are receiving this medicine. Avoid taking products that contain aspirin, acetaminophen, ibuprofen, naproxen, or ketoprofen unless instructed by your doctor. These medicines may hide a fever. Do not become pregnant while taking this medicine. Women should inform their doctor if they wish to become pregnant or think they might be pregnant. There is a potential   for serious side effects to an unborn child. Talk to your health care professional or pharmacist for more information. Do not breast-feed an infant while taking this medicine. Men should inform their doctor if they wish to father a child. This medicine may  lower sperm counts. Do not treat diarrhea with over the counter products. Contact your doctor if you have diarrhea that lasts more than 2 days or if it is severe and watery. This medicine can make you more sensitive to the sun. Keep out of the sun. If you cannot avoid being in the sun, wear protective clothing and use sunscreen. Do not use sun lamps or tanning beds/booths. What side effects may I notice from receiving this medicine? Side effects that you should report to your doctor or health care professional as soon as possible:  allergic reactions like skin rash, itching or hives, swelling of the face, lips, or tongue  low blood counts - this medicine may decrease the number of white blood cells, red blood cells and platelets. You may be at increased risk for infections and bleeding.  signs of infection - fever or chills, cough, sore throat, pain or difficulty passing urine  signs of decreased platelets or bleeding - bruising, pinpoint red spots on the skin, black, tarry stools, blood in the urine  signs of decreased red blood cells - unusually weak or tired, fainting spells, lightheadedness  breathing problems  changes in vision  chest pain  mouth sores  nausea and vomiting  pain, swelling, redness at site where injected  pain, tingling, numbness in the hands or feet  redness, swelling, or sores on hands or feet  stomach pain  unusual bleeding Side effects that usually do not require medical attention (report to your doctor or health care professional if they continue or are bothersome):  changes in finger or toe nails  diarrhea  dry or itchy skin  hair loss  headache  loss of appetite  sensitivity of eyes to the light  stomach upset  unusually teary eyes This list may not describe all possible side effects. Call your doctor for medical advice about side effects. You may report side effects to FDA at 1-800-FDA-1088. Where should I keep my medicine? This  drug is given in a hospital or clinic and will not be stored at home. NOTE: This sheet is a summary. It may not cover all possible information. If you have questions about this medicine, talk to your doctor, pharmacist, or health care provider.  2021 Elsevier/Gold Standard (2019-03-17 15:00:03)

## 2020-07-16 ENCOUNTER — Other Ambulatory Visit: Payer: Self-pay | Admitting: Hematology & Oncology

## 2020-07-18 ENCOUNTER — Encounter: Payer: Self-pay | Admitting: Family

## 2020-07-18 ENCOUNTER — Inpatient Hospital Stay: Payer: Medicare HMO

## 2020-07-18 ENCOUNTER — Inpatient Hospital Stay: Payer: Medicare HMO | Admitting: Family

## 2020-07-18 ENCOUNTER — Other Ambulatory Visit: Payer: Self-pay

## 2020-07-18 VITALS — BP 131/66 | HR 65 | Temp 98.4°F | Resp 17 | Ht 70.0 in | Wt 221.4 lb

## 2020-07-18 DIAGNOSIS — K5730 Diverticulosis of large intestine without perforation or abscess without bleeding: Secondary | ICD-10-CM

## 2020-07-18 DIAGNOSIS — C20 Malignant neoplasm of rectum: Secondary | ICD-10-CM

## 2020-07-18 DIAGNOSIS — K625 Hemorrhage of anus and rectum: Secondary | ICD-10-CM

## 2020-07-18 DIAGNOSIS — D500 Iron deficiency anemia secondary to blood loss (chronic): Secondary | ICD-10-CM

## 2020-07-18 DIAGNOSIS — C775 Secondary and unspecified malignant neoplasm of intrapelvic lymph nodes: Secondary | ICD-10-CM

## 2020-07-18 DIAGNOSIS — Z5111 Encounter for antineoplastic chemotherapy: Secondary | ICD-10-CM | POA: Diagnosis not present

## 2020-07-18 LAB — CBC WITH DIFFERENTIAL (CANCER CENTER ONLY)
Abs Immature Granulocytes: 0.02 10*3/uL (ref 0.00–0.07)
Basophils Absolute: 0 10*3/uL (ref 0.0–0.1)
Basophils Relative: 0 %
Eosinophils Absolute: 0.1 10*3/uL (ref 0.0–0.5)
Eosinophils Relative: 1 %
HCT: 27.3 % — ABNORMAL LOW (ref 39.0–52.0)
Hemoglobin: 9.6 g/dL — ABNORMAL LOW (ref 13.0–17.0)
Immature Granulocytes: 0 %
Lymphocytes Relative: 17 %
Lymphs Abs: 0.9 10*3/uL (ref 0.7–4.0)
MCH: 32.9 pg (ref 26.0–34.0)
MCHC: 35.2 g/dL (ref 30.0–36.0)
MCV: 93.5 fL (ref 80.0–100.0)
Monocytes Absolute: 0.5 10*3/uL (ref 0.1–1.0)
Monocytes Relative: 9 %
Neutro Abs: 3.8 10*3/uL (ref 1.7–7.7)
Neutrophils Relative %: 73 %
Platelet Count: 118 10*3/uL — ABNORMAL LOW (ref 150–400)
RBC: 2.92 MIL/uL — ABNORMAL LOW (ref 4.22–5.81)
RDW: 18.2 % — ABNORMAL HIGH (ref 11.5–15.5)
WBC Count: 5.3 10*3/uL (ref 4.0–10.5)
nRBC: 0.4 % — ABNORMAL HIGH (ref 0.0–0.2)

## 2020-07-18 LAB — IRON AND TIBC
Iron: 74 ug/dL (ref 42–163)
Saturation Ratios: 22 % (ref 20–55)
TIBC: 341 ug/dL (ref 202–409)
UIBC: 267 ug/dL (ref 117–376)

## 2020-07-18 LAB — CMP (CANCER CENTER ONLY)
ALT: 11 U/L (ref 0–44)
AST: 13 U/L — ABNORMAL LOW (ref 15–41)
Albumin: 4.1 g/dL (ref 3.5–5.0)
Alkaline Phosphatase: 63 U/L (ref 38–126)
Anion gap: 7 (ref 5–15)
BUN: 19 mg/dL (ref 8–23)
CO2: 29 mmol/L (ref 22–32)
Calcium: 9 mg/dL (ref 8.9–10.3)
Chloride: 104 mmol/L (ref 98–111)
Creatinine: 1.06 mg/dL (ref 0.61–1.24)
GFR, Estimated: >60 mL/min (ref >60)
Glucose, Bld: 265 mg/dL — ABNORMAL HIGH (ref 70–99)
Potassium: 3.4 mmol/L — ABNORMAL LOW (ref 3.5–5.1)
Sodium: 140 mmol/L (ref 135–145)
Total Bilirubin: 1.2 mg/dL (ref 0.3–1.2)
Total Protein: 6.2 g/dL — ABNORMAL LOW (ref 6.5–8.1)

## 2020-07-18 LAB — RETICULOCYTES
Immature Retic Fract: 24.4 % — ABNORMAL HIGH (ref 2.3–15.9)
RBC.: 2.98 MIL/uL — ABNORMAL LOW (ref 4.22–5.81)
Retic Count, Absolute: 202 10*3/uL — ABNORMAL HIGH (ref 19.0–186.0)
Retic Ct Pct: 6.8 % — ABNORMAL HIGH (ref 0.4–3.1)

## 2020-07-18 LAB — FERRITIN: Ferritin: 347 ng/mL — ABNORMAL HIGH (ref 24–336)

## 2020-07-18 LAB — CEA (IN HOUSE-CHCC): CEA (CHCC-In House): 1.83 ng/mL (ref 0.00–5.00)

## 2020-07-18 LAB — LACTATE DEHYDROGENASE: LDH: 234 U/L — ABNORMAL HIGH (ref 98–192)

## 2020-07-18 MED ORDER — SODIUM CHLORIDE 0.9 % IV SOLN
10.0000 mg | Freq: Once | INTRAVENOUS | Status: AC
  Administered 2020-07-18: 10 mg via INTRAVENOUS
  Filled 2020-07-18: qty 10

## 2020-07-18 MED ORDER — ACETAMINOPHEN 325 MG PO TABS
650.0000 mg | ORAL_TABLET | Freq: Once | ORAL | Status: AC
  Administered 2020-07-18: 650 mg via ORAL

## 2020-07-18 MED ORDER — SODIUM CHLORIDE 0.9 % IV SOLN
510.0000 mg | Freq: Once | INTRAVENOUS | Status: AC
  Administered 2020-07-18: 510 mg via INTRAVENOUS
  Filled 2020-07-18: qty 17

## 2020-07-18 MED ORDER — ACETAMINOPHEN 325 MG PO TABS
ORAL_TABLET | ORAL | Status: AC
  Filled 2020-07-18: qty 2

## 2020-07-18 MED ORDER — SODIUM CHLORIDE 0.9 % IV SOLN
Freq: Once | INTRAVENOUS | Status: DC
  Filled 2020-07-18: qty 250

## 2020-07-18 MED ORDER — FAMOTIDINE IN NACL 20-0.9 MG/50ML-% IV SOLN
20.0000 mg | Freq: Once | INTRAVENOUS | Status: AC
  Administered 2020-07-18: 20 mg via INTRAVENOUS

## 2020-07-18 MED ORDER — FAMOTIDINE IN NACL 20-0.9 MG/50ML-% IV SOLN
INTRAVENOUS | Status: AC
  Filled 2020-07-18: qty 50

## 2020-07-18 MED ORDER — SODIUM CHLORIDE 0.9 % IV SOLN
Freq: Once | INTRAVENOUS | Status: AC
  Filled 2020-07-18: qty 250

## 2020-07-18 MED ORDER — DEXTROSE 5 % IV SOLN
Freq: Once | INTRAVENOUS | Status: AC
  Filled 2020-07-18: qty 250

## 2020-07-18 MED ORDER — PALONOSETRON HCL INJECTION 0.25 MG/5ML
INTRAVENOUS | Status: AC
  Filled 2020-07-18: qty 5

## 2020-07-18 MED ORDER — PALONOSETRON HCL INJECTION 0.25 MG/5ML
0.2500 mg | Freq: Once | INTRAVENOUS | Status: AC
  Administered 2020-07-18: 0.25 mg via INTRAVENOUS

## 2020-07-18 MED ORDER — OXALIPLATIN CHEMO INJECTION 100 MG/20ML
85.0000 mg/m2 | Freq: Once | INTRAVENOUS | Status: AC
  Administered 2020-07-18: 185 mg via INTRAVENOUS
  Filled 2020-07-18: qty 37

## 2020-07-18 MED ORDER — DEXTROSE 5 % IV SOLN
400.0000 mg/m2 | Freq: Once | INTRAVENOUS | Status: AC
  Administered 2020-07-18: 864 mg via INTRAVENOUS
  Filled 2020-07-18: qty 43.2

## 2020-07-18 MED ORDER — FLUOROURACIL CHEMO INJECTION 2.5 GM/50ML
400.0000 mg/m2 | Freq: Once | INTRAVENOUS | Status: AC
  Administered 2020-07-18: 850 mg via INTRAVENOUS
  Filled 2020-07-18: qty 17

## 2020-07-18 MED ORDER — SODIUM CHLORIDE 0.9 % IV SOLN
1920.0000 mg/m2 | INTRAVENOUS | Status: DC
  Administered 2020-07-18: 4150 mg via INTRAVENOUS
  Filled 2020-07-18: qty 83

## 2020-07-18 NOTE — Progress Notes (Signed)
Hematology and Oncology Follow Up Visit  John Douglas [id number] 1955-07-29 65 y.o. 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 He is doing fairly well but notes a dry irritated nose possibly due to the pollen.  He denies fever, chills, n/v, cough, rash, dizziness, SOB, chest pain, palpitations, abdominal pain or changes in bowel or bladder habits.  He still has fatigue but notes that his "brain fog" since having Covid a couple months ago seems to be improving.  He is doing well on anticoagulation for atrial fib and has not noted any blood loss. No abnormal bruising, no petechiae.  His Hgb today is 9.6 (previously 10.4). His iron saturation was low at 19% last month.   CEA last month was 1.75. LDH is 234 (previously 191).  No swelling, tenderness, numbness or tingling in his extremities at this time. He notes numbness and tingling in his fingertips for about 4-5 days after each cycle of treatment.  He does note that he has issues at times with balance and is careful when ambulating. No falls or syncope to report.  He has maintained a good appetite and is staying well hydrated. His weight is stable.   ECOG Performance Status: 1 - Symptomatic but completely ambulatory  Medications:  Allergies as of 07/18/2020      Reactions   Celexa [citalopram] Other (See Comments)   Contraindicated with A-Fib   Ibuprofen Other (See Comments)   Was told to not take this    Metformin And Related Other (See Comments)   Bloody stools    Naproxen Other (See  Comments)   Was told to not take this    Yellow Jacket Venom [bee Venom] Swelling, Other (See Comments)   Severe swelling where stung   Penicillins Hives   Did it involve swelling of the face/tongue/throat, SOB, or low BP? Unk Did it involve sudden or severe rash/hives, skin peeling, or any reaction on the inside of your mouth or nose? Yes Did you need to seek medical attention at a hospital or doctor's office? Unk When did it last happen?"Childhood- 55 years ago" If all above answers are "NO", may proceed with cephalosporin use.      Medication List       Accurate as of July 18, 2020  9:03 AM. If you have any questions, ask your nurse or doctor.        apixaban 5 MG Tabs tablet Commonly known as: ELIQUIS Take 1 tablet (5 mg total) by mouth 2 (two) times daily.   capecitabine 500 MG tablet Commonly known as: XELODA Take 4 tablets (2,000 mg total) by mouth daily. Take Xeloda on Monday thru Friday for 6 weeks.Start 2 days before radiation therapy starts   diltiazem 180 MG 24 hr capsule Commonly known as: CARDIZEM CD Take 1 capsule (180 mg total) by mouth daily.   ezetimibe 10 MG tablet Commonly known as: ZETIA Take 1 tablet by mouth once daily   furosemide 40 MG tablet Commonly known as: LASIX TAKE 1 TABLET BY MOUTH EVERY DAY   gabapentin 100 MG capsule Commonly  known as: NEURONTIN TAKE 1 CAPSULE BY MOUTH AT BEDTIME.   glucose blood test strip Monitor BS before meals and at bedtime for a couple of weeks. Then may decrease to bid before meals   Klor-Con M20 20 MEQ tablet Generic drug: potassium chloride SA TAKE 1 TABLET BY MOUTH EVERY DAY   metoprolol succinate 100 MG 24 hr tablet Commonly known as: TOPROL-XL TAKE 1 TABLET BY MOUTH TWICE A DAY IN MORNING AND BEDTIME   nitroGLYCERIN 0.4 MG SL tablet Commonly known as: NITROSTAT Place 0.4 mg under the tongue every 5 (five) minutes as needed for chest pain.   onetouch ultrasoft lancets Use as instructed    rosuvastatin 20 MG tablet Commonly known as: CRESTOR TAKE 1 TABLET BY MOUTH EVERY DAY   sertraline 50 MG tablet Commonly known as: ZOLOFT TAKE 1 TABLET BY MOUTH EVERY DAY       Allergies:  Allergies  Allergen Reactions  . Celexa [Citalopram] Other (See Comments)    Contraindicated with A-Fib  . Ibuprofen Other (See Comments)    Was told to not take this   . Metformin And Related Other (See Comments)    Bloody stools   . Naproxen Other (See Comments)    Was told to not take this   . Yellow Jacket Venom [Bee Venom] Swelling and Other (See Comments)    Severe swelling where stung  . Penicillins Hives    Did it involve swelling of the face/tongue/throat, SOB, or low BP? Unk Did it involve sudden or severe rash/hives, skin peeling, or any reaction on the inside of your mouth or nose? Yes Did you need to seek medical attention at a hospital or doctor's office? Unk When did it last happen?"Childhood- 53 years ago" If all above answers are "NO", may proceed with cephalosporin use.     Past Medical History, Surgical history, Social history, and Family History were reviewed and updated.  Review of Systems: All other 10 point review of systems is negative.   Physical Exam:  vitals were not taken for this visit.   Wt Readings from Last 3 Encounters:  07/04/20 224 lb (101.6 kg)  06/20/20 228 lb (103.4 kg)  05/18/20 227 lb 4 oz (103.1 kg)    Ocular: Sclerae unicteric, pupils equal, round and reactive to light Ear-nose-throat: Oropharynx clear, dentition fair Lymphatic: No cervical, supraclavicular or axillary adenopathy Lungs no rales or rhonchi, good excursion bilaterally Irregular A fib, no murmur appreciated Abd soft, nontender, positive bowel sounds MSK no focal spinal tenderness, no joint edema Neuro: non-focal, well-oriented, appropriate affect Breasts: Deferred   Lab Results  Component Value Date   WBC 6.6 07/04/2020   HGB 10.7 (L) 07/04/2020   HCT 30.5 (L)  07/04/2020   MCV 92.1 07/04/2020   PLT 134 (L) 07/04/2020   Lab Results  Component Value Date   FERRITIN 300 07/04/2020   IRON 60 07/04/2020   TIBC 315 07/04/2020   UIBC 255 07/04/2020   IRONPCTSAT 19 (L) 07/04/2020   Lab Results  Component Value Date   RBC 3.31 (L) 07/04/2020   No results found for: KPAFRELGTCHN, LAMBDASER, KAPLAMBRATIO No results found for: IGGSERUM, IGA, IGMSERUM No results found for: Odetta Pink, SPEI   Chemistry      Component Value Date/Time   NA 139 07/04/2020 0909   NA 140 08/28/2019 0940   K 3.2 (L) 07/04/2020 0909   CL 100 07/04/2020 0909   CO2 31 07/04/2020 0909  BUN 21 07/04/2020 0909   BUN 21 08/28/2019 0940   CREATININE 1.14 07/04/2020 0909      Component Value Date/Time   CALCIUM 9.3 07/04/2020 0909   ALKPHOS 61 07/04/2020 0909   AST 11 (L) 07/04/2020 0909   ALT 10 07/04/2020 0909   BILITOT 1.1 07/04/2020 0909       Impression and Plan: Mr. Fosnaugh is a very pleasant 65 yo gentleman with metastatic rectal cancer, adenopathy that was distant to rectum. He also has history of CVA secondary to atrial fib.  She completed chemoradiation therapy in late December 2021.  He is now on chemotherapy with FOLFOX and tolerating nicely so far. We will proceed with treatment today as planned.  He will also receive IV iron today as well.  We will repeat his PET scan in 3 weeks and he will have follow-up with Dr. Marin Olp the week after that.  He was encouraged to contact our office with any questions or concerns.   Laverna Peace, NP 3/21/20229:03 AM

## 2020-07-18 NOTE — Patient Instructions (Signed)
Fluorouracil, 5-FU injection What is this medicine? FLUOROURACIL, 5-FU (flure oh YOOR a sil) is a chemotherapy drug. It slows the growth of cancer cells. This medicine is used to treat many types of cancer like breast cancer, colon or rectal cancer, pancreatic cancer, and stomach cancer. This medicine may be used for other purposes; ask your health care provider or pharmacist if you have questions. COMMON BRAND NAME(S): Adrucil What should I tell my health care provider before I take this medicine? They need to know if you have any of these conditions:  blood disorders  dihydropyrimidine dehydrogenase (DPD) deficiency  infection (especially a virus infection such as chickenpox, cold sores, or herpes)  kidney disease  liver disease  malnourished, poor nutrition  recent or ongoing radiation therapy  an unusual or allergic reaction to fluorouracil, other chemotherapy, other medicines, foods, dyes, or preservatives  pregnant or trying to get pregnant  breast-feeding How should I use this medicine? This drug is given as an infusion or injection into a vein. It is administered in a hospital or clinic by a specially trained health care professional. Talk to your pediatrician regarding the use of this medicine in children. Special care may be needed. Overdosage: If you think you have taken too much of this medicine contact a poison control center or emergency room at once. NOTE: This medicine is only for you. Do not share this medicine with others. What if I miss a dose? It is important not to miss your dose. Call your doctor or health care professional if you are unable to keep an appointment. What may interact with this medicine? Do not take this medicine with any of the following medications:  live virus vaccines This medicine may also interact with the following medications:  medicines that treat or prevent blood clots like warfarin, enoxaparin, and dalteparin This list may not  describe all possible interactions. Give your health care provider a list of all the medicines, herbs, non-prescription drugs, or dietary supplements you use. Also tell them if you smoke, drink alcohol, or use illegal drugs. Some items may interact with your medicine. What should I watch for while using this medicine? Visit your doctor for checks on your progress. This drug may make you feel generally unwell. This is not uncommon, as chemotherapy can affect healthy cells as well as cancer cells. Report any side effects. Continue your course of treatment even though you feel ill unless your doctor tells you to stop. In some cases, you may be given additional medicines to help with side effects. Follow all directions for their use. Call your doctor or health care professional for advice if you get a fever, chills or sore throat, or other symptoms of a cold or flu. Do not treat yourself. This drug decreases your body's ability to fight infections. Try to avoid being around people who are sick. This medicine may increase your risk to bruise or bleed. Call your doctor or health care professional if you notice any unusual bleeding. Be careful brushing and flossing your teeth or using a toothpick because you may get an infection or bleed more easily. If you have any dental work done, tell your dentist you are receiving this medicine. Avoid taking products that contain aspirin, acetaminophen, ibuprofen, naproxen, or ketoprofen unless instructed by your doctor. These medicines may hide a fever. Do not become pregnant while taking this medicine. Women should inform their doctor if they wish to become pregnant or think they might be pregnant. There is a potential   for serious side effects to an unborn child. Talk to your health care professional or pharmacist for more information. Do not breast-feed an infant while taking this medicine. Men should inform their doctor if they wish to father a child. This medicine may  lower sperm counts. Do not treat diarrhea with over the counter products. Contact your doctor if you have diarrhea that lasts more than 2 days or if it is severe and watery. This medicine can make you more sensitive to the sun. Keep out of the sun. If you cannot avoid being in the sun, wear protective clothing and use sunscreen. Do not use sun lamps or tanning beds/booths. What side effects may I notice from receiving this medicine? Side effects that you should report to your doctor or health care professional as soon as possible:  allergic reactions like skin rash, itching or hives, swelling of the face, lips, or tongue  low blood counts - this medicine may decrease the number of white blood cells, red blood cells and platelets. You may be at increased risk for infections and bleeding.  signs of infection - fever or chills, cough, sore throat, pain or difficulty passing urine  signs of decreased platelets or bleeding - bruising, pinpoint red spots on the skin, black, tarry stools, blood in the urine  signs of decreased red blood cells - unusually weak or tired, fainting spells, lightheadedness  breathing problems  changes in vision  chest pain  mouth sores  nausea and vomiting  pain, swelling, redness at site where injected  pain, tingling, numbness in the hands or feet  redness, swelling, or sores on hands or feet  stomach pain  unusual bleeding Side effects that usually do not require medical attention (report to your doctor or health care professional if they continue or are bothersome):  changes in finger or toe nails  diarrhea  dry or itchy skin  hair loss  headache  loss of appetite  sensitivity of eyes to the light  stomach upset  unusually teary eyes This list may not describe all possible side effects. Call your doctor for medical advice about side effects. You may report side effects to FDA at 1-800-FDA-1088. Where should I keep my medicine? This  drug is given in a hospital or clinic and will not be stored at home. NOTE: This sheet is a summary. It may not cover all possible information. If you have questions about this medicine, talk to your doctor, pharmacist, or health care provider.  2021 Elsevier/Gold Standard (2019-03-17 15:00:03)  

## 2020-07-18 NOTE — Patient Instructions (Signed)

## 2020-07-20 ENCOUNTER — Other Ambulatory Visit: Payer: Self-pay

## 2020-07-20 ENCOUNTER — Inpatient Hospital Stay: Payer: Medicare HMO

## 2020-07-20 VITALS — BP 124/65 | HR 56 | Temp 98.6°F | Resp 18

## 2020-07-20 DIAGNOSIS — C20 Malignant neoplasm of rectum: Secondary | ICD-10-CM

## 2020-07-20 DIAGNOSIS — Z5111 Encounter for antineoplastic chemotherapy: Secondary | ICD-10-CM | POA: Diagnosis not present

## 2020-07-20 MED ORDER — SODIUM CHLORIDE 0.9% FLUSH
10.0000 mL | INTRAVENOUS | Status: DC | PRN
  Administered 2020-07-20: 10 mL
  Filled 2020-07-20: qty 10

## 2020-07-20 MED ORDER — HEPARIN SOD (PORK) LOCK FLUSH 100 UNIT/ML IV SOLN
500.0000 [IU] | Freq: Once | INTRAVENOUS | Status: AC | PRN
  Administered 2020-07-20: 500 [IU]
  Filled 2020-07-20: qty 5

## 2020-07-20 NOTE — Patient Instructions (Signed)
Tunneled Central Venous Catheter Flushing Guide  It is important to flush your tunneled central venous catheter each time you use it, both before and after you use it. Flushing your catheter will help prevent it from clogging. What are the risks? Risks may include:  Infection.  Air getting into the catheter and bloodstream. Supplies needed:  A clean pair of gloves.  A disinfecting wipe. Use an alcohol wipe, chlorhexidine wipe, or iodine wipe as told by your health care provider.  A 10 mL syringe that has been prefilled with saline solution.  An empty 10 mL syringe, if a substance called heparin was injected into your catheter. How to flush your catheter When you flush your catheter, make sure you follow any specific instructions from your health care provider or the manufacturer. These are general guidelines. Flushing your catheter before use If there is heparin in your catheter: 1. Wash your hands with soap and water. 2. Put on gloves. 3. Scrub the injection cap for a minimum of 15 seconds with a disinfecting wipe. 4. Unclamp the catheter. 5. Attach the empty syringe to the injection cap. 6. Pull the syringe plunger back and withdraw 10 mL of blood. 7. Place the syringe into an appropriate waste container. 8. Scrub the injection cap for 15 seconds with a disinfecting wipe. 9. Attach the prefilled syringe to the injection cap. 10. Flush the catheter by pushing the plunger forward until all the liquid from the syringe is in the catheter. 11. Remove the syringe from the injection cap. 12. Clamp the catheter. If there is no heparin in your catheter: 1. Wash your hands with soap and water. 2. Put on gloves. 3. Scrub the injection cap for 15 seconds with a disinfecting wipe. 4. Unclamp the catheter. 5. Attach the prefilled syringe to the injection cap. 6. Flush the catheter by pushing the plunger forward until 5 mL of the liquid from the syringe is in the catheter. 7. Pull back on  the syringe until you see blood in the catheter. 8. If you have been asked to collect any blood, follow your health care provider's instructions. Otherwise, flush the catheter with the rest of the solution from the syringe. 9. Remove the syringe from the injection cap. 10. Clamp the catheter.   Flushing your catheter after use 1. Wash your hands with soap and water. 2. Put on gloves. 3. Scrub the injection cap for 15 seconds with a disinfecting wipe. 4. Unclamp the catheter. 5. Attach the prefilled syringe to the injection cap. 6. Flush the catheter by pushing the plunger forward until all of the liquid from the syringe is in the catheter. 7. Remove the syringe from the injection cap. 8. Clamp the catheter. Problems and solutions  If blood cannot be completely cleared from the injection cap, you may need to have the injection cap replaced.  If the catheter is difficult to flush, use the pulsing method. The pulsing method involves pushing only a few milliliters of solution into the catheter at a time and pausing between pushes.  If you do not see blood in the catheter when you pull back on the syringe, change your body position, such as by raising your arms above your head. Take a deep breath and cough. Then, pull back on the syringe. If you still do not see blood, flush the catheter with a small amount of solution. Then, change positions again and take a breath or cough. Pull back on the syringe again. If you still do not   see blood, finish flushing the catheter and contact your health care provider. Do not use your catheter until your health care provider says it is okay. General tips  Have someone help you flush your catheter, if possible.  Do not force fluid through your catheter.  Do not use a syringe that is larger or smaller than 10 mL. Using a smaller syringe can make the catheter burst.  Do not use your catheter without flushing it first if it has heparin in it. Contact a health  care provider if:  You cannot see any blood in the catheter when you flush it before using it.  Your catheter is difficult to flush. Get help right away if:  You cannot flush the catheter.  The catheter leaks when you flush it or when there is fluid in it.  There are cracks or breaks in the catheter. Summary  It is important to flush your tunneled central venous catheter each time you use it, both before and after you use it.  Scrub the injection cap for 15 seconds with a disinfecting wipe before and after you flush it.  When you flush your catheter, make sure you follow any specific instructions from your health care provider or the manufacturer.  Get help right away if you cannot flush the catheter. This information is not intended to replace advice given to you by your health care provider. Make sure you discuss any questions you have with your health care provider. Document Revised: 06/25/2019 Document Reviewed: 07/02/2018 Elsevier Patient Education  2021 [employer] Inc.  

## 2020-07-22 ENCOUNTER — Other Ambulatory Visit (HOSPITAL_COMMUNITY): Payer: Self-pay

## 2020-08-01 ENCOUNTER — Ambulatory Visit: Payer: Medicare HMO | Admitting: Hematology & Oncology

## 2020-08-01 ENCOUNTER — Other Ambulatory Visit: Payer: Medicare HMO

## 2020-08-01 ENCOUNTER — Ambulatory Visit: Payer: Medicare HMO

## 2020-08-12 ENCOUNTER — Other Ambulatory Visit: Payer: Self-pay

## 2020-08-12 ENCOUNTER — Ambulatory Visit (HOSPITAL_COMMUNITY)
Admission: RE | Admit: 2020-08-12 | Discharge: 2020-08-12 | Disposition: A | Discharge status: Home / Self Care | Payer: Medicare HMO | Source: Ambulatory Visit | Attending: Family | Admitting: Family

## 2020-08-12 DIAGNOSIS — N200 Calculus of kidney: Secondary | ICD-10-CM | POA: Diagnosis not present

## 2020-08-12 DIAGNOSIS — D500 Iron deficiency anemia secondary to blood loss (chronic): Secondary | ICD-10-CM | POA: Insufficient documentation

## 2020-08-12 DIAGNOSIS — I700 Atherosclerosis of aorta: Secondary | ICD-10-CM | POA: Insufficient documentation

## 2020-08-12 DIAGNOSIS — C775 Secondary and unspecified malignant neoplasm of intrapelvic lymph nodes: Secondary | ICD-10-CM | POA: Insufficient documentation

## 2020-08-12 DIAGNOSIS — I2510 Atherosclerotic heart disease of native coronary artery without angina pectoris: Secondary | ICD-10-CM | POA: Insufficient documentation

## 2020-08-12 DIAGNOSIS — C20 Malignant neoplasm of rectum: Secondary | ICD-10-CM | POA: Insufficient documentation

## 2020-08-12 LAB — GLUCOSE, CAPILLARY: Glucose-Capillary: 162 mg/dL — ABNORMAL HIGH (ref 70–99)

## 2020-08-12 MED ORDER — FLUDEOXYGLUCOSE F - 18 (FDG) INJECTION
11.1000 | Freq: Once | INTRAVENOUS | Status: AC
  Administered 2020-08-12: 11.1 via INTRAVENOUS

## 2020-08-14 ENCOUNTER — Other Ambulatory Visit: Payer: Self-pay | Admitting: Hematology & Oncology

## 2020-08-15 ENCOUNTER — Inpatient Hospital Stay (HOSPITAL_BASED_OUTPATIENT_CLINIC_OR_DEPARTMENT_OTHER): Payer: Medicare HMO | Admitting: Hematology & Oncology

## 2020-08-15 ENCOUNTER — Ambulatory Visit: Payer: Medicare HMO

## 2020-08-15 ENCOUNTER — Inpatient Hospital Stay: Payer: Medicare HMO | Attending: Hematology & Oncology

## 2020-08-15 ENCOUNTER — Encounter: Payer: Self-pay | Admitting: Hematology & Oncology

## 2020-08-15 ENCOUNTER — Inpatient Hospital Stay: Payer: Medicare HMO

## 2020-08-15 VITALS — BP 108/72 | HR 51 | Temp 98.5°F | Resp 18 | Wt 218.0 lb

## 2020-08-15 DIAGNOSIS — C20 Malignant neoplasm of rectum: Secondary | ICD-10-CM

## 2020-08-15 DIAGNOSIS — Z5111 Encounter for antineoplastic chemotherapy: Secondary | ICD-10-CM | POA: Insufficient documentation

## 2020-08-15 DIAGNOSIS — Z5112 Encounter for antineoplastic immunotherapy: Secondary | ICD-10-CM | POA: Diagnosis not present

## 2020-08-15 DIAGNOSIS — I4891 Unspecified atrial fibrillation: Secondary | ICD-10-CM | POA: Diagnosis not present

## 2020-08-15 DIAGNOSIS — D500 Iron deficiency anemia secondary to blood loss (chronic): Secondary | ICD-10-CM

## 2020-08-15 DIAGNOSIS — Z452 Encounter for adjustment and management of vascular access device: Secondary | ICD-10-CM | POA: Diagnosis not present

## 2020-08-15 DIAGNOSIS — Z7901 Long term (current) use of anticoagulants: Secondary | ICD-10-CM | POA: Insufficient documentation

## 2020-08-15 LAB — RETICULOCYTES
Immature Retic Fract: 21.8 % — ABNORMAL HIGH (ref 2.3–15.9)
RBC.: 3.43 MIL/uL — ABNORMAL LOW (ref 4.22–5.81)
Retic Count, Absolute: 184.2 10*3/uL (ref 19.0–186.0)
Retic Ct Pct: 5.4 % — ABNORMAL HIGH (ref 0.4–3.1)

## 2020-08-15 LAB — CBC WITH DIFFERENTIAL (CANCER CENTER ONLY)
Abs Immature Granulocytes: 0.05 10*3/uL (ref 0.00–0.07)
Basophils Absolute: 0 10*3/uL (ref 0.0–0.1)
Basophils Relative: 1 %
Eosinophils Absolute: 0.1 10*3/uL (ref 0.0–0.5)
Eosinophils Relative: 1 %
HCT: 32.8 % — ABNORMAL LOW (ref 39.0–52.0)
Hemoglobin: 11 g/dL — ABNORMAL LOW (ref 13.0–17.0)
Immature Granulocytes: 1 %
Lymphocytes Relative: 18 %
Lymphs Abs: 1.1 10*3/uL (ref 0.7–4.0)
MCH: 32.2 pg (ref 26.0–34.0)
MCHC: 33.5 g/dL (ref 30.0–36.0)
MCV: 95.9 fL (ref 80.0–100.0)
Monocytes Absolute: 0.6 10*3/uL (ref 0.1–1.0)
Monocytes Relative: 9 %
Neutro Abs: 4.3 10*3/uL (ref 1.7–7.7)
Neutrophils Relative %: 70 %
Platelet Count: 162 10*3/uL (ref 150–400)
RBC: 3.42 MIL/uL — ABNORMAL LOW (ref 4.22–5.81)
RDW: 17.9 % — ABNORMAL HIGH (ref 11.5–15.5)
WBC Count: 6.1 10*3/uL (ref 4.0–10.5)
nRBC: 0 % (ref 0.0–0.2)

## 2020-08-15 LAB — IRON AND TIBC
Iron: 84 ug/dL (ref 42–163)
Saturation Ratios: 27 % (ref 20–55)
TIBC: 313 ug/dL (ref 202–409)
UIBC: 228 ug/dL (ref 117–376)

## 2020-08-15 LAB — CMP (CANCER CENTER ONLY)
ALT: 11 U/L (ref 0–44)
AST: 16 U/L (ref 15–41)
Albumin: 4.2 g/dL (ref 3.5–5.0)
Alkaline Phosphatase: 50 U/L (ref 38–126)
Anion gap: 7 (ref 5–15)
BUN: 22 mg/dL (ref 8–23)
CO2: 29 mmol/L (ref 22–32)
Calcium: 9.6 mg/dL (ref 8.9–10.3)
Chloride: 105 mmol/L (ref 98–111)
Creatinine: 0.96 mg/dL (ref 0.61–1.24)
GFR, Estimated: >60 mL/min (ref >60)
Glucose, Bld: 177 mg/dL — ABNORMAL HIGH (ref 70–99)
Potassium: 3.5 mmol/L (ref 3.5–5.1)
Sodium: 141 mmol/L (ref 135–145)
Total Bilirubin: 1.1 mg/dL (ref 0.3–1.2)
Total Protein: 6.6 g/dL (ref 6.5–8.1)

## 2020-08-15 LAB — LACTATE DEHYDROGENASE: LDH: 233 U/L — ABNORMAL HIGH (ref 98–192)

## 2020-08-15 LAB — FERRITIN: Ferritin: 516 ng/mL — ABNORMAL HIGH (ref 24–336)

## 2020-08-15 LAB — CEA (IN HOUSE-CHCC): CEA (CHCC-In House): 1.56 ng/mL (ref 0.00–5.00)

## 2020-08-15 MED ORDER — SODIUM CHLORIDE 0.9% FLUSH
10.0000 mL | Freq: Once | INTRAVENOUS | Status: AC
  Administered 2020-08-15: 10 mL via INTRAVENOUS
  Filled 2020-08-15: qty 10

## 2020-08-15 MED ORDER — HEPARIN SOD (PORK) LOCK FLUSH 100 UNIT/ML IV SOLN
500.0000 [IU] | Freq: Once | INTRAVENOUS | Status: AC
  Administered 2020-08-15: 500 [IU] via INTRAVENOUS
  Filled 2020-08-15: qty 5

## 2020-08-15 NOTE — Progress Notes (Signed)
Hematology and Oncology Follow Up Visit  John Douglas [id number] 08/17/1955 65 y.o. 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 twice daily   Aspirin 81 mg p.o. daily  FOLFOX --  S/p cycle #10-- started on 11/17/2019  XRT/Xeloda -- start on 04/01/2020 -- completed 04/27/2020  5-FU/Avastin -- maintenance -- start cycle #1 on 08/22/2020     Interim History:  Mr. Liby is in for follow-up.  He looks pretty good.  He feels pretty good.  He was at the beach I think for the Easter week.  He enjoyed himself.  We did do a PET scan on him.  The PET scan was done on the 15th.  The PET scan thankfully show that he is responding.  He had the PET scan show that there is still activity where he has the primary tumor.  This an SUV of 7.1.  He had some adenopathy in the retroperitoneum, left iliac and retrocrural nodes which were not enlarged by pathologic criteria and had minimally decreased low-level activity.  I really think that we can try to consider him for maintenance type of therapy.  I think that 5-FU infusion plus Avastin would not be a bad idea.  The CVA that he had was secondary to atrial fibrillation.  He is on Eliquis right now.  I think his heart doctor wants to try to treat him and get him out of atrial fibrillation.  He has had no problems with bowels or bladder.  He has had no problems with nausea or vomiting.  He has had no issues with cough or shortness of breath.  Again, his blood sugars have been on the high side.  Overall, I would say his performance status is probably ECOG 1.    Medications:  Current Outpatient Medications:  .  apixaban (ELIQUIS) 5 MG TABS tablet, Take 1 tablet (5 mg total) by mouth 2 (two) times daily., Disp: 60 tablet, Rfl: 5 .  capecitabine (XELODA) 500 MG tablet, TAKE 4  TABLETS (2,000 MG TOTAL) BY MOUTH DAILY. TAKE XELODA ON MONDAY THRU FRIDAY FOR 6 WEEKS.START 2 DAYS BEFORE RADIATION THERAPY STARTS (Patient not taking: No sig reported), Disp: 120 tablet, Rfl: 0 .  diltiazem (CARDIZEM CD) 180 MG 24 hr capsule, Take 1 capsule (180 mg total) by mouth daily., Disp: 90 capsule, Rfl: 1 .  ezetimibe (ZETIA) 10 MG tablet, Take 1 tablet by mouth once daily, Disp: 90 tablet, Rfl: 3 .  furosemide (LASIX) 40 MG tablet, TAKE 1 TABLET BY MOUTH EVERY DAY, Disp: 90 tablet, Rfl: 0 .  gabapentin (NEURONTIN) 100 MG capsule, TAKE 1 CAPSULE BY MOUTH AT BEDTIME. (Patient taking differently: Take 100 mg by mouth at bedtime.), Disp: 30 capsule, Rfl: 3 .  glimepiride (AMARYL) 2 MG tablet, Take 2 mg by mouth daily., Disp: , Rfl:  .  glucose blood test strip, Monitor BS before meals and at bedtime for a couple of weeks. Then may decrease to bid before meals, Disp: 100 each, Rfl: 12 .  KLOR-CON M20 20 MEQ tablet, TAKE 1 TABLET BY MOUTH EVERY DAY, Disp: 90 tablet, Rfl: 0 .  Lancets (ONETOUCH ULTRASOFT) lancets, Use as instructed, Disp: 100 each, Rfl: 12 .  metoprolol succinate (TOPROL-XL) 100 MG 24 hr tablet, TAKE 1 TABLET BY MOUTH TWICE A DAY IN MORNING AND BEDTIME, Disp: 180 tablet,  Rfl: 0 .  nitroGLYCERIN (NITROSTAT) 0.4 MG SL tablet, Place 0.4 mg under the tongue every 5 (five) minutes as needed for chest pain. , Disp: , Rfl:  .  rosuvastatin (CRESTOR) 20 MG tablet, TAKE 1 TABLET BY MOUTH EVERY DAY, Disp: 90 tablet, Rfl: 2 .  sertraline (ZOLOFT) 50 MG tablet, TAKE 1 TABLET BY MOUTH EVERY DAY, Disp: 90 tablet, Rfl: 0  Allergies:  Allergies  Allergen Reactions  . Celexa [Citalopram] Other (See Comments)    Contraindicated with A-Fib  . Ibuprofen Other (See Comments)    Was told to not take this   . Metformin And Related Other (See Comments)    Bloody stools   . Naproxen Other (See Comments)    Was told to not take this   . Yellow Jacket Venom [Bee Venom] Swelling and Other (See  Comments)    Severe swelling where stung  . Penicillins Hives    Did it involve swelling of the face/tongue/throat, SOB, or low BP? Unk Did it involve sudden or severe rash/hives, skin peeling, or any reaction on the inside of your mouth or nose? Yes Did you need to seek medical attention at a hospital or doctor's office? Unk When did it last happen?"Childhood- 55 years ago" If all above answers are "NO", may proceed with cephalosporin use.     Past Medical History, Surgical history, Social history, and Family History were reviewed and updated.  Review of Systems: Review of Systems  Constitutional: Negative.   HENT:  Negative.   Eyes: Negative.   Respiratory: Negative.   Cardiovascular: Positive for palpitations.  Gastrointestinal: Positive for blood in stool.  Endocrine: Negative.   Genitourinary: Negative.    Skin: Negative.   Neurological: Positive for extremity weakness.  Hematological: Negative.   Psychiatric/Behavioral: Negative.     Physical Exam:  weight is 218 lb (98.9 kg). His oral temperature is 98.5 F (36.9 C). His blood pressure is 108/72 and his pulse is 51 (abnormal). His respiration is 18 and oxygen saturation is 98%.   Wt Readings from Last 3 Encounters:  08/15/20 218 lb (98.9 kg)  07/18/20 221 lb 6.4 oz (100.4 kg)  07/04/20 224 lb (101.6 kg)    Physical Exam Vitals reviewed.  HENT:     Head: Normocephalic and atraumatic.  Eyes:     Pupils: Pupils are equal, round, and reactive to light.  Cardiovascular:     Rate and Rhythm: Normal rate and regular rhythm.     Heart sounds: Normal heart sounds.     Comments: Cardiac exam shows an irregular rate and irregular rhythm consistent with atrial fibrillation.  The rate is fairly well controlled.   There are no murmurs, rubs or bruits. Pulmonary:     Effort: Pulmonary effort is normal.     Breath sounds: Normal breath sounds.  Abdominal:     General: Bowel sounds are normal.     Palpations: Abdomen is  soft.     Comments: Abdominal exam is soft.  He has decent bowel sounds.  There is no guarding or rebound tenderness.  There is no fluid wave.  There is no palpable liver or spleen tip.  Musculoskeletal:        General: No tenderness or deformity. Normal range of motion.     Cervical back: Normal range of motion.  Lymphadenopathy:     Cervical: No cervical adenopathy.  Skin:    General: Skin is warm and dry.     Findings: No erythema or rash.  Neurological:     Mental Status: He is alert and oriented to person, place, and time.  Psychiatric:        Behavior: Behavior normal.        Thought Content: Thought content normal.        Judgment: Judgment normal.      Lab Results  Component Value Date   WBC 6.1 08/15/2020   HGB 11.0 (L) 08/15/2020   HCT 32.8 (L) 08/15/2020   MCV 95.9 08/15/2020   PLT 162 08/15/2020     Chemistry      Component Value Date/Time   NA 141 08/15/2020 0915   NA 140 08/28/2019 0940   K 3.5 08/15/2020 0915   CL 105 08/15/2020 0915   CO2 29 08/15/2020 0915   BUN 22 08/15/2020 0915   BUN 21 08/28/2019 0940   CREATININE 0.96 08/15/2020 0915      Component Value Date/Time   CALCIUM 9.6 08/15/2020 0915   ALKPHOS 50 08/15/2020 0915   AST 16 08/15/2020 0915   ALT 11 08/15/2020 0915   BILITOT 1.1 08/15/2020 0915      Impression and Plan: Mr. Bardon is a very nice 65 year old white male.  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  He had a rectal bleeding.  He subsequently was found to have rectal cancer.  He had adenopathy that was distant to the rectum.  Again, at this time, I think we really can go with a maintenance type program.  I think that we might even want to consider Xeloda with Avastin.  We will have to think about this.  I realize that Xeloda might be more toxic.  For right now, we will just see about the 5-FU infusion along with Avastin.  I think we can do this every 3 weeks.  This would be a reasonable way of  trying to maintain him in a good remission/partial remission.  I probably would plan for another scan on him in about 3 months.  We will get treatment started next week.  I will plan to see him back in about 4 weeks.      Volanda Napoleon, MD 4/18/202210:15 AM

## 2020-08-15 NOTE — Progress Notes (Signed)
Follow-up Outpatient Visit Date: 08/17/2020  Primary Care Provider: Leonides Sake, MD [place] [id number]  Chief Complaint: Follow-up CAD and a-fib  HPI:  Mr. Torbert is a 65 y.o. male with history of CAD with inferior STEMI and primary PCI to the RCA in 57/3220 complicated by sustained ventricular tachycardia in the setting of residual left coronary artery disease status post subsequent PCI to the mid LAD (06/2019),hypertension, hyperlipidemia,paroxysmalatrial fibrillationcomplicated by stroke in 09/2019, ischemic cardiomyopathy, type 2 diabetes mellitus, and recurrent GI bleedingwithsubsequentdiagnosis ofrectalcancer s/p neoadjuvant chemotherapy and XRT, who presents for follow-up of coronary artery disease, cardiomyopathy, and atrial fibrillation.  I last saw him in 04/2020, at which time Mr. Kitt reported easy fatigability since starting radiation therapy.  He also noted continued left leg weakness and balance issues dating back to his stroke last summer.  We did not make any medication changes or pursue additional testing, as Mr. Millstein wished to defer cardioversion until after completion of chemotherapy and radiation.  Today, Mr. Pulliam reports that he has been feeling fairly well.  He contracted COVID-19 about 8 weeks ago but was minimally symptomatic other than headaches and some lingering "brain fog."  He also notes some lingering fatigue.  He follows closely with Dr. Marin Olp and may begin "maintenance" chemotherapy in the future.  He reports that PET/CT last week as encouraging.  Mr. Offerdahl denies chest pain, shortness of breath, palpitations, lightheadedness, and edema.  He has been able to come off his diabetes medications and reports that his blood sugars have been well-controlled other than a few isolated high readings.  --------------------------------------------------------------------------------------------------  Past Medical History:  Diagnosis  Date  . Allergic rhinitis    [facility]  . Anxiety   . Benign essential hypertension    [facility]  . Coronary artery disease    [facility]  . Diabetic neuropathy, type II diabetes mellitus ([place])    [facility]  . Dysrhythmia   . Iron deficiency anemia due to chronic blood loss 11/18/2019  . Mixed hyperlipidemia    [facility]  . Myocardial infarct [facility])    [facility]  . Obesity, unspecified    [facility]  . Persistent atrial fibrillation ([place])   . Rectal bleeding    [facility]  . 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 ([place]) 11/17/2019  . Stroke ([place])   . Wide-complex tachycardia ([place]) 06/04/2019   Past Surgical History:  Procedure Laterality Date  . BIOPSY  10/07/2019   Procedure: BIOPSY;  Surgeon: Lavena Bullion, DO;  Location: [facility];  Service: Gastroenterology;;  . COLONOSCOPY WITH PROPOFOL N/A 10/07/2019   Procedure: COLONOSCOPY WITH PROPOFOL;  Surgeon: Lavena Bullion, DO;  Location: [facility];  Service: Gastroenterology;  Laterality: N/A;  . CORONARY ANGIOPLASTY     3 stents  . IR IMAGING GUIDED PORT INSERTION  11/24/2019  . LEFT HEART CATH AND CORONARY ANGIOGRAPHY N/A 06/05/2019   Procedure: LEFT HEART CATH AND CORONARY ANGIOGRAPHY;  Surgeon: Nelva Bush, MD;  Location: [facility];  Service: Cardiovascular;  Laterality: N/A;  . POLYPECTOMY  10/07/2019   Procedure: POLYPECTOMY;  Surgeon: Lavena Bullion, DO;  Location: [facility];  Service: Gastroenterology;;  . Lia Foyer TATTOO INJECTION  10/07/2019   Procedure: SUBMUCOSAL TATTOO INJECTION;  Surgeon: Lavena Bullion, DO;  Location: [facility];  Service: Gastroenterology;;    Current Meds  Medication  Sig  . apixaban (ELIQUIS) 5 MG TABS tablet Take 1 tablet (5 mg total) by mouth 2 (two) times daily.  Marland Kitchen diltiazem  (CARDIZEM CD) 180 MG 24 hr capsule Take 1 capsule (180 mg total) by mouth daily.  Marland Kitchen ezetimibe (ZETIA) 10 MG tablet Take 1 tablet by mouth once daily  . furosemide (LASIX) 40 MG tablet TAKE 1 TABLET BY MOUTH EVERY DAY  . gabapentin (NEURONTIN) 100 MG capsule TAKE 1 CAPSULE BY MOUTH AT BEDTIME.  Marland Kitchen glimepiride (AMARYL) 2 MG tablet Take 2 mg by mouth daily.  Marland Kitchen glucose blood test strip Monitor BS before meals and at bedtime for a couple of weeks. Then may decrease to bid before meals  . KLOR-CON M20 20 MEQ tablet TAKE 1 TABLET BY MOUTH EVERY DAY  . Lancets (ONETOUCH ULTRASOFT) lancets Use as instructed  . metoprolol succinate (TOPROL-XL) 100 MG 24 hr tablet TAKE 1 TABLET BY MOUTH TWICE A DAY IN MORNING AND BEDTIME  . nitroGLYCERIN (NITROSTAT) 0.4 MG SL tablet Place 0.4 mg under the tongue every 5 (five) minutes as needed for chest pain.   . rosuvastatin (CRESTOR) 20 MG tablet TAKE 1 TABLET BY MOUTH EVERY DAY  . sertraline (ZOLOFT) 50 MG tablet TAKE 1 TABLET BY MOUTH EVERY DAY    Allergies: Celexa [citalopram], Ibuprofen, Metformin and related, Naproxen, Yellow jacket venom [bee venom], and Penicillins  Social History   Tobacco Use  . Smoking status: Never Smoker  . Smokeless tobacco: Former Systems developer    Types: Secondary school teacher  . Vaping Use: Never used  Substance Use Topics  . Alcohol use: Not Currently    Comment: occasional  . Drug use: Not Currently    Types: Marijuana    Family History  Problem Relation Age of Onset  . Diverticulitis Mother   . Hypertension Father   . Heart disease Father        MI  . Prostate cancer Father   . Colon cancer Neg Hx   . Stomach cancer Neg Hx   . Pancreatic cancer Neg Hx   . Rectal cancer Neg Hx     Review of Systems: A 12-system review of systems was performed and was negative except as noted in the HPI.  --------------------------------------------------------------------------------------------------  Physical Exam: BP 104/70 (BP Location:  Left Arm, Patient Position: Sitting, Cuff Size: Large)   Pulse 61   Ht 5\' 10"  (1.778 m)   Wt 223 lb (101.2 kg)   SpO2 98%   BMI 32.00 kg/m   General:  NAD. Neck: No JVD or HJR. Lungs: Clear to auscultation bilaterally without wheezes or crackles. Heart: Regular rate and rhythm without murmurs, rubs, or gallops. Abdomen: Soft, nontender, nondistended. Extremities: No lower extremity edema.  EKG:  Sinus bradycardia (HR 59 bpm) with inferior infarct and lateral T wave inversions.  Compared with prior tracing on 05/18/2020, sinus rhythm is now present.  Lab Results  Component Value Date   WBC 6.1 08/15/2020   HGB 11.0 (L) 08/15/2020   HCT 32.8 (L) 08/15/2020   MCV 95.9 08/15/2020   PLT 162 08/15/2020    Lab Results  Component Value Date   NA 141 08/15/2020   K 3.5 08/15/2020   CL 105 08/15/2020   CO2 29 08/15/2020   BUN 22 08/15/2020   CREATININE 0.96 08/15/2020   GLUCOSE 177 (H) 08/15/2020   ALT 11 08/15/2020    Lab Results  Component Value Date   CHOL 148 10/09/2019   HDL 26 (L) 10/09/2019  [place] Smith 98 10/09/2019   TRIG 119 10/09/2019   CHOLHDL 5.7 10/09/2019    --------------------------------------------------------------------------------------------------  ASSESSMENT AND PLAN: Coronary artery disease: No symptoms to suggest worsening coronary insufficiency.  Continue current medications for secondary prevention.  Defer antiplatelet therapy in the setting of rectal cancer with prior bleeding and long-term anticoagulation with apixaban.  Persistent atrial fibrillation: EKG today is low amplitude but is consistent with sinus bradycardia.  Given stroke last year in the setting of CHADSVASC score of at least 7, we will continue indefinite anticoagulation with apixaban 5 mg BID.  We have agreed to decrease metoprolol succinate to 100 mg daily (from BID), given borderline low resting heart rate, soft blood pressure, and fatigue.  Chronic HFpEF: Mr. Harada appears  euvolemic on exam with NYHA class II symptoms.  We will decrease metoprolol as above.  Continue furosemide 40 mg daily.  BMP normal on last check 2 days ago.  Hyperlipidemia associated with type 2 diabetes mellitus: Lipid panel through PCP in 05/2020 showed LDL < 70.  We will continue rosuvastatin 20 mg daily.  Follow-up: Return to clinic in 4 months.  Nelva Bush, MD 08/17/2020 10:08 AM

## 2020-08-15 NOTE — Patient Instructions (Signed)

## 2020-08-16 ENCOUNTER — Telehealth: Payer: Self-pay

## 2020-08-16 NOTE — Telephone Encounter (Signed)
S/w pt per 08/15/20 los and he is aware of his appts, pt will also gain a sch at his 4/20 cardiology appt    Leaann Nevils

## 2020-08-17 ENCOUNTER — Other Ambulatory Visit: Payer: Self-pay

## 2020-08-17 ENCOUNTER — Ambulatory Visit: Payer: Medicare HMO | Admitting: Internal Medicine

## 2020-08-17 ENCOUNTER — Encounter: Payer: Self-pay | Admitting: Internal Medicine

## 2020-08-17 VITALS — BP 104/70 | HR 59 | Ht 70.0 in | Wt 223.0 lb

## 2020-08-17 DIAGNOSIS — E1169 Type 2 diabetes mellitus with other specified complication: Secondary | ICD-10-CM | POA: Insufficient documentation

## 2020-08-17 DIAGNOSIS — I5032 Chronic diastolic (congestive) heart failure: Secondary | ICD-10-CM | POA: Diagnosis not present

## 2020-08-17 DIAGNOSIS — I2510 Atherosclerotic heart disease of native coronary artery without angina pectoris: Secondary | ICD-10-CM

## 2020-08-17 DIAGNOSIS — I4819 Other persistent atrial fibrillation: Secondary | ICD-10-CM | POA: Diagnosis not present

## 2020-08-17 DIAGNOSIS — E785 Hyperlipidemia, unspecified: Secondary | ICD-10-CM

## 2020-08-17 MED ORDER — METOPROLOL SUCCINATE ER 100 MG PO TB24: 100.0000 mg | ORAL_TABLET | Freq: Every day | ORAL | 1 refills | Status: DC

## 2020-08-17 NOTE — Patient Instructions (Signed)
Medication Instructions:  Your physician has recommended you make the following change in your medication:   DECREASE Metoprolol Succinate to 100 mg daily.  *If you need a refill on your cardiac medications before your next appointment, please call your pharmacy*   Lab Work: None ordered If you have labs (blood work) drawn today and your tests are completely normal, you will receive your results only by: Marland Kitchen MyChart Message (if you have MyChart) OR . A paper copy in the mail If you have any lab test that is abnormal or we need to change your treatment, we will call you to review the results.   Testing/Procedures: None ordered   Follow-Up: At [facility], you and your health needs are our priority.  As part of our continuing mission to provide you with exceptional heart care, we have created designated Provider Care Teams.  These Care Teams include your primary Cardiologist (physician) and Advanced Practice Providers (APPs -  Physician Assistants and Nurse Practitioners) who all work together to provide you with the care you need, when you need it.  We recommend signing up for the patient portal called "MyChart".  Sign up information is provided on this After Visit Summary.  MyChart is used to connect with patients for Virtual Visits (Telemedicine).  Patients are able to view lab/test results, encounter notes, upcoming appointments, etc.  Non-urgent messages can be sent to your provider as well.   To learn more about what you can do with MyChart, go to [email or web address].    Your next appointment:   4 month(s)  The format for your next appointment:   In Person  Provider:   You may see Nelva Bush, MD or one of the following Advanced Practice Providers on your designated Care Team:    Murray Hodgkins, NP  Christell Faith, PA-C  Marrianne Mood, PA-C  Cadence Chesterfield, [place]  Laurann Montana, NP    Other Instructions N/A

## 2020-08-23 ENCOUNTER — Other Ambulatory Visit: Payer: Self-pay | Admitting: *Deleted

## 2020-08-23 DIAGNOSIS — K5730 Diverticulosis of large intestine without perforation or abscess without bleeding: Secondary | ICD-10-CM

## 2020-08-23 DIAGNOSIS — C20 Malignant neoplasm of rectum: Secondary | ICD-10-CM

## 2020-08-24 ENCOUNTER — Inpatient Hospital Stay: Payer: Medicare HMO

## 2020-08-24 ENCOUNTER — Other Ambulatory Visit: Payer: Self-pay

## 2020-08-24 VITALS — BP 118/72 | HR 60 | Temp 98.0°F | Resp 18

## 2020-08-24 DIAGNOSIS — C20 Malignant neoplasm of rectum: Secondary | ICD-10-CM

## 2020-08-24 DIAGNOSIS — K5730 Diverticulosis of large intestine without perforation or abscess without bleeding: Secondary | ICD-10-CM

## 2020-08-24 DIAGNOSIS — Z5112 Encounter for antineoplastic immunotherapy: Secondary | ICD-10-CM | POA: Diagnosis not present

## 2020-08-24 LAB — CMP (CANCER CENTER ONLY)
ALT: 12 U/L (ref 0–44)
AST: 13 U/L — ABNORMAL LOW (ref 15–41)
Albumin: 4.4 g/dL (ref 3.5–5.0)
Alkaline Phosphatase: 53 U/L (ref 38–126)
Anion gap: 8 (ref 5–15)
BUN: 20 mg/dL (ref 8–23)
CO2: 31 mmol/L (ref 22–32)
Calcium: 9.8 mg/dL (ref 8.9–10.3)
Chloride: 102 mmol/L (ref 98–111)
Creatinine: 1.07 mg/dL (ref 0.61–1.24)
GFR, Estimated: >60 mL/min (ref >60)
Glucose, Bld: 205 mg/dL — ABNORMAL HIGH (ref 70–99)
Potassium: 3.3 mmol/L — ABNORMAL LOW (ref 3.5–5.1)
Sodium: 141 mmol/L (ref 135–145)
Total Bilirubin: 1.1 mg/dL (ref 0.3–1.2)
Total Protein: 6.7 g/dL (ref 6.5–8.1)

## 2020-08-24 LAB — CBC WITH DIFFERENTIAL (CANCER CENTER ONLY)
Abs Immature Granulocytes: 0.02 10*3/uL (ref 0.00–0.07)
Basophils Absolute: 0 10*3/uL (ref 0.0–0.1)
Basophils Relative: 1 %
Eosinophils Absolute: 0.1 10*3/uL (ref 0.0–0.5)
Eosinophils Relative: 2 %
HCT: 33.9 % — ABNORMAL LOW (ref 39.0–52.0)
Hemoglobin: 12 g/dL — ABNORMAL LOW (ref 13.0–17.0)
Immature Granulocytes: 0 %
Lymphocytes Relative: 14 %
Lymphs Abs: 1 10*3/uL (ref 0.7–4.0)
MCH: 33.1 pg (ref 26.0–34.0)
MCHC: 35.4 g/dL (ref 30.0–36.0)
MCV: 93.4 fL (ref 80.0–100.0)
Monocytes Absolute: 0.6 10*3/uL (ref 0.1–1.0)
Monocytes Relative: 7 %
Neutro Abs: 5.7 10*3/uL (ref 1.7–7.7)
Neutrophils Relative %: 76 %
Platelet Count: 231 10*3/uL (ref 150–400)
RBC: 3.63 MIL/uL — ABNORMAL LOW (ref 4.22–5.81)
RDW: 16.4 % — ABNORMAL HIGH (ref 11.5–15.5)
WBC Count: 7.5 10*3/uL (ref 4.0–10.5)
nRBC: 0 % (ref 0.0–0.2)

## 2020-08-24 LAB — TOTAL PROTEIN, URINE DIPSTICK: Protein, ur: NEGATIVE mg/dL

## 2020-08-24 MED ORDER — SODIUM CHLORIDE 0.9 % IV SOLN
400.0000 mg/m2 | Freq: Once | INTRAVENOUS | Status: AC
Start: 2020-08-24 — End: 2020-08-24
  Administered 2020-08-24: 864 mg via INTRAVENOUS
  Filled 2020-08-24: qty 43.2

## 2020-08-24 MED ORDER — LEUCOVORIN CALCIUM INJECTION 350 MG: 400.0000 mg/m2 | Freq: Once | INTRAVENOUS | Status: DC

## 2020-08-24 MED ORDER — DEXTROSE 5 % IV SOLN
Freq: Once | INTRAVENOUS | Status: DC
  Filled 2020-08-24: qty 250

## 2020-08-24 MED ORDER — SODIUM CHLORIDE 0.9 % IV SOLN
2325.0000 mg/m2 | INTRAVENOUS | Status: DC
  Administered 2020-08-24: 5000 mg via INTRAVENOUS
  Filled 2020-08-24: qty 100

## 2020-08-24 MED ORDER — FLUOROURACIL CHEMO INJECTION 2.5 GM/50ML
400.0000 mg/m2 | Freq: Once | INTRAVENOUS | Status: AC
  Administered 2020-08-24: 850 mg via INTRAVENOUS
  Filled 2020-08-24: qty 17

## 2020-08-24 MED ORDER — LIDOCAINE-PRILOCAINE 2.5-2.5 % EX CREA: TOPICAL_CREAM | CUTANEOUS | 3 refills | Status: DC

## 2020-08-24 MED ORDER — SODIUM CHLORIDE 0.9 % IV SOLN
7.5000 mg/kg | Freq: Once | INTRAVENOUS | Status: AC
  Administered 2020-08-24: 700 mg via INTRAVENOUS
  Filled 2020-08-24: qty 16

## 2020-08-24 MED ORDER — SODIUM CHLORIDE 0.9 % IV SOLN
Freq: Once | INTRAVENOUS | Status: AC
  Filled 2020-08-24: qty 250

## 2020-08-24 NOTE — Patient Instructions (Signed)

## 2020-08-24 NOTE — Progress Notes (Signed)
No premedications for fluorouracil/leucovorin. Resume 2400 mg/m2 dose for fluorouracil pump since oxaliplatin has been discontinued per Dr. Marin Olp.

## 2020-08-24 NOTE — Patient Instructions (Signed)
Fluorouracil, 5-FU injection What is this medicine? FLUOROURACIL, 5-FU (flure oh YOOR a sil) is a chemotherapy drug. It slows the growth of cancer cells. This medicine is used to treat many types of cancer like breast cancer, colon or rectal cancer, pancreatic cancer, and stomach cancer. This medicine may be used for other purposes; ask your health care provider or pharmacist if you have questions. COMMON BRAND NAME(S): Adrucil What should I tell my health care provider before I take this medicine? They need to know if you have any of these conditions:  blood disorders  dihydropyrimidine dehydrogenase (DPD) deficiency  infection (especially a virus infection such as chickenpox, cold sores, or herpes)  kidney disease  liver disease  malnourished, poor nutrition  recent or ongoing radiation therapy  an unusual or allergic reaction to fluorouracil, other chemotherapy, other medicines, foods, dyes, or preservatives  pregnant or trying to get pregnant  breast-feeding How should I use this medicine? This drug is given as an infusion or injection into a vein. It is administered in a hospital or clinic by a specially trained health care professional. Talk to your pediatrician regarding the use of this medicine in children. Special care may be needed. Overdosage: If you think you have taken too much of this medicine contact a poison control center or emergency room at once. NOTE: This medicine is only for you. Do not share this medicine with others. What if I miss a dose? It is important not to miss your dose. Call your doctor or health care professional if you are unable to keep an appointment. What may interact with this medicine? Do not take this medicine with any of the following medications:  live virus vaccines This medicine may also interact with the following medications:  medicines that treat or prevent blood clots like warfarin, enoxaparin, and dalteparin This list may not  describe all possible interactions. Give your health care provider a list of all the medicines, herbs, non-prescription drugs, or dietary supplements you use. Also tell them if you smoke, drink alcohol, or use illegal drugs. Some items may interact with your medicine. What should I watch for while using this medicine? Visit your doctor for checks on your progress. This drug may make you feel generally unwell. This is not uncommon, as chemotherapy can affect healthy cells as well as cancer cells. Report any side effects. Continue your course of treatment even though you feel ill unless your doctor tells you to stop. In some cases, you may be given additional medicines to help with side effects. Follow all directions for their use. Call your doctor or health care professional for advice if you get a fever, chills or sore throat, or other symptoms of a cold or flu. Do not treat yourself. This drug decreases your body's ability to fight infections. Try to avoid being around people who are sick. This medicine may increase your risk to bruise or bleed. Call your doctor or health care professional if you notice any unusual bleeding. Be careful brushing and flossing your teeth or using a toothpick because you may get an infection or bleed more easily. If you have any dental work done, tell your dentist you are receiving this medicine. Avoid taking products that contain aspirin, acetaminophen, ibuprofen, naproxen, or ketoprofen unless instructed by your doctor. These medicines may hide a fever. Do not become pregnant while taking this medicine. Women should inform their doctor if they wish to become pregnant or think they might be pregnant. There is a potential   for serious side effects to an unborn child. Talk to your health care professional or pharmacist for more information. Do not breast-feed an infant while taking this medicine. Men should inform their doctor if they wish to father a child. This medicine may  lower sperm counts. Do not treat diarrhea with over the counter products. Contact your doctor if you have diarrhea that lasts more than 2 days or if it is severe and watery. This medicine can make you more sensitive to the sun. Keep out of the sun. If you cannot avoid being in the sun, wear protective clothing and use sunscreen. Do not use sun lamps or tanning beds/booths. What side effects may I notice from receiving this medicine? Side effects that you should report to your doctor or health care professional as soon as possible:  allergic reactions like skin rash, itching or hives, swelling of the face, lips, or tongue  low blood counts - this medicine may decrease the number of white blood cells, red blood cells and platelets. You may be at increased risk for infections and bleeding.  signs of infection - fever or chills, cough, sore throat, pain or difficulty passing urine  signs of decreased platelets or bleeding - bruising, pinpoint red spots on the skin, black, tarry stools, blood in the urine  signs of decreased red blood cells - unusually weak or tired, fainting spells, lightheadedness  breathing problems  changes in vision  chest pain  mouth sores  nausea and vomiting  pain, swelling, redness at site where injected  pain, tingling, numbness in the hands or feet  redness, swelling, or sores on hands or feet  stomach pain  unusual bleeding Side effects that usually do not require medical attention (report to your doctor or health care professional if they continue or are bothersome):  changes in finger or toe nails  diarrhea  dry or itchy skin  hair loss  headache  loss of appetite  sensitivity of eyes to the light  stomach upset  unusually teary eyes This list may not describe all possible side effects. Call your doctor for medical advice about side effects. You may report side effects to FDA at 1-800-FDA-1088. Where should I keep my medicine? This  drug is given in a hospital or clinic and will not be stored at home. NOTE: This sheet is a summary. It may not cover all possible information. If you have questions about this medicine, talk to your doctor, pharmacist, or health care provider.  2021 Elsevier/Gold Standard (2019-03-17 15:00:03) Leucovorin injection What is this medicine? LEUCOVORIN (loo koe VOR in) is used to prevent or treat the harmful effects of some medicines. This medicine is used to treat anemia caused by a low amount of folic acid in the body. It is also used with 5-fluorouracil (5-FU) to treat colon cancer. This medicine may be used for other purposes; ask your health care provider or pharmacist if you have questions. What should I tell my health care provider before I take this medicine? They need to know if you have any of these conditions:  anemia from low levels of vitamin B-12 in the blood  an unusual or allergic reaction to leucovorin, folic acid, other medicines, foods, dyes, or preservatives  pregnant or trying to get pregnant  breast-feeding How should I use this medicine? This medicine is for injection into a muscle or into a vein. It is given by a health care professional in a hospital or clinic setting. Talk to your pediatrician   regarding the use of this medicine in children. Special care may be needed. Overdosage: If you think you have taken too much of this medicine contact a poison control center or emergency room at once. NOTE: This medicine is only for you. Do not share this medicine with others. What if I miss a dose? This does not apply. What may interact with this medicine?  capecitabine  fluorouracil  phenobarbital  phenytoin  primidone  trimethoprim-sulfamethoxazole This list may not describe all possible interactions. Give your health care provider a list of all the medicines, herbs, non-prescription drugs, or dietary supplements you use. Also tell them if you smoke, drink alcohol,  or use illegal drugs. Some items may interact with your medicine. What should I watch for while using this medicine? Your condition will be monitored carefully while you are receiving this medicine. This medicine may increase the side effects of 5-fluorouracil, 5-FU. Tell your doctor or health care professional if you have diarrhea or mouth sores that do not get better or that get worse. What side effects may I notice from receiving this medicine? Side effects that you should report to your doctor or health care professional as soon as possible:  allergic reactions like skin rash, itching or hives, swelling of the face, lips, or tongue  breathing problems  fever, infection  mouth sores  unusual bleeding or bruising  unusually weak or tired Side effects that usually do not require medical attention (report to your doctor or health care professional if they continue or are bothersome):  constipation or diarrhea  loss of appetite  nausea, vomiting This list may not describe all possible side effects. Call your doctor for medical advice about side effects. You may report side effects to FDA at 1-800-FDA-1088. Where should I keep my medicine? This drug is given in a hospital or clinic and will not be stored at home. NOTE: This sheet is a summary. It may not cover all possible information. If you have questions about this medicine, talk to your doctor, pharmacist, or health care provider.  2021 Elsevier/Gold Standard (2007-10-21 16:50:29) Bevacizumab injection What is this medicine? BEVACIZUMAB (be va SIZ yoo mab) is a monoclonal antibody. It is used to treat many types of cancer. This medicine may be used for other purposes; ask your health care provider or pharmacist if you have questions. COMMON BRAND NAME(S): Avastin, MVASI, Zirabev What should I tell my health care provider before I take this medicine? They need to know if you have any of these conditions:  diabetes  heart  disease  high blood pressure  history of coughing up blood  prior anthracycline chemotherapy (e.g., doxorubicin, daunorubicin, epirubicin)  recent or ongoing radiation therapy  recent or planning to have surgery  stroke  an unusual or allergic reaction to bevacizumab, hamster proteins, mouse proteins, other medicines, foods, dyes, or preservatives  pregnant or trying to get pregnant  breast-feeding How should I use this medicine? This medicine is for infusion into a vein. It is given by a health care professional in a hospital or clinic setting. Talk to your pediatrician regarding the use of this medicine in children. Special care may be needed. Overdosage: If you think you have taken too much of this medicine contact a poison control center or emergency room at once. NOTE: This medicine is only for you. Do not share this medicine with others. What if I miss a dose? It is important not to miss your dose. Call your doctor or health care professional   if you are unable to keep an appointment. What may interact with this medicine? Interactions are not expected. This list may not describe all possible interactions. Give your health care provider a list of all the medicines, herbs, non-prescription drugs, or dietary supplements you use. Also tell them if you smoke, drink alcohol, or use illegal drugs. Some items may interact with your medicine. What should I watch for while using this medicine? Your condition will be monitored carefully while you are receiving this medicine. You will need important blood work and urine testing done while you are taking this medicine. This medicine may increase your risk to bruise or bleed. Call your doctor or health care professional if you notice any unusual bleeding. Before having surgery, talk to your health care provider to make sure it is ok. This drug can increase the risk of poor healing of your surgical site or wound. You will need to stop this drug  for 28 days before surgery. After surgery, wait at least 28 days before restarting this drug. Make sure the surgical site or wound is healed enough before restarting this drug. Talk to your health care provider if questions. Do not become pregnant while taking this medicine or for 6 months after stopping it. Women should inform their doctor if they wish to become pregnant or think they might be pregnant. There is a potential for serious side effects to an unborn child. Talk to your health care professional or pharmacist for more information. Do not breast-feed an infant while taking this medicine and for 6 months after the last dose. This medicine has caused ovarian failure in some women. This medicine may interfere with the ability to have a child. You should talk to your doctor or health care professional if you are concerned about your fertility. What side effects may I notice from receiving this medicine? Side effects that you should report to your doctor or health care professional as soon as possible:  allergic reactions like skin rash, itching or hives, swelling of the face, lips, or tongue  chest pain or chest tightness  chills  coughing up blood  high fever  seizures  severe constipation  signs and symptoms of bleeding such as bloody or black, tarry stools; red or dark-brown urine; spitting up blood or brown material that looks like coffee grounds; red spots on the skin; unusual bruising or bleeding from the eye, gums, or nose  signs and symptoms of a blood clot such as breathing problems; chest pain; severe, sudden headache; pain, swelling, warmth in the leg  signs and symptoms of a stroke like changes in vision; confusion; trouble speaking or understanding; severe headaches; sudden numbness or weakness of the face, arm or leg; trouble walking; dizziness; loss of balance or coordination  stomach pain  sweating  swelling of legs or ankles  vomiting  weight gain Side  effects that usually do not require medical attention (report to your doctor or health care professional if they continue or are bothersome):  back pain  changes in taste  decreased appetite  dry skin  nausea  tiredness This list may not describe all possible side effects. Call your doctor for medical advice about side effects. You may report side effects to FDA at 1-800-FDA-1088. Where should I keep my medicine? This drug is given in a hospital or clinic and will not be stored at home. NOTE: This sheet is a summary. It may not cover all possible information. If you have questions about this medicine,   talk to your doctor, pharmacist, or health care provider.  2021 Elsevier/Gold Standard (2019-02-11 10:50:46)  

## 2020-08-26 ENCOUNTER — Inpatient Hospital Stay: Payer: Medicare HMO

## 2020-08-26 ENCOUNTER — Other Ambulatory Visit: Payer: Self-pay

## 2020-08-26 VITALS — BP 122/68 | HR 62 | Temp 98.2°F | Resp 18

## 2020-08-26 DIAGNOSIS — C20 Malignant neoplasm of rectum: Secondary | ICD-10-CM

## 2020-08-26 DIAGNOSIS — Z5112 Encounter for antineoplastic immunotherapy: Secondary | ICD-10-CM | POA: Diagnosis not present

## 2020-08-26 MED ORDER — HEPARIN SOD (PORK) LOCK FLUSH 100 UNIT/ML IV SOLN
500.0000 [IU] | Freq: Once | INTRAVENOUS | Status: AC | PRN
  Administered 2020-08-26: 500 [IU]
  Filled 2020-08-26: qty 5

## 2020-08-26 MED ORDER — SODIUM CHLORIDE 0.9% FLUSH
10.0000 mL | INTRAVENOUS | Status: DC | PRN
  Administered 2020-08-26: 10 mL
  Filled 2020-08-26: qty 10

## 2020-08-26 NOTE — Patient Instructions (Signed)
Tunneled Central Venous Catheter Flushing Guide  It is important to flush your tunneled central venous catheter each time you use it, both before and after you use it. Flushing your catheter will help prevent it from clogging. What are the risks? Risks may include:  Infection.  Air getting into the catheter and bloodstream. Supplies needed:  A clean pair of gloves.  A disinfecting wipe. Use an alcohol wipe, chlorhexidine wipe, or iodine wipe as told by your health care provider.  A 10 mL syringe that has been prefilled with saline solution.  An empty 10 mL syringe, if a substance called heparin was injected into your catheter. How to flush your catheter When you flush your catheter, make sure you follow any specific instructions from your health care provider or the manufacturer. These are general guidelines. Flushing your catheter before use If there is heparin in your catheter: 1. Wash your hands with soap and water. 2. Put on gloves. 3. Scrub the injection cap for a minimum of 15 seconds with a disinfecting wipe. 4. Unclamp the catheter. 5. Attach the empty syringe to the injection cap. 6. Pull the syringe plunger back and withdraw 10 mL of blood. 7. Place the syringe into an appropriate waste container. 8. Scrub the injection cap for 15 seconds with a disinfecting wipe. 9. Attach the prefilled syringe to the injection cap. 10. Flush the catheter by pushing the plunger forward until all the liquid from the syringe is in the catheter. 11. Remove the syringe from the injection cap. 12. Clamp the catheter. If there is no heparin in your catheter: 1. Wash your hands with soap and water. 2. Put on gloves. 3. Scrub the injection cap for 15 seconds with a disinfecting wipe. 4. Unclamp the catheter. 5. Attach the prefilled syringe to the injection cap. 6. Flush the catheter by pushing the plunger forward until 5 mL of the liquid from the syringe is in the catheter. 7. Pull back on  the syringe until you see blood in the catheter. 8. If you have been asked to collect any blood, follow your health care provider's instructions. Otherwise, flush the catheter with the rest of the solution from the syringe. 9. Remove the syringe from the injection cap. 10. Clamp the catheter.   Flushing your catheter after use 1. Wash your hands with soap and water. 2. Put on gloves. 3. Scrub the injection cap for 15 seconds with a disinfecting wipe. 4. Unclamp the catheter. 5. Attach the prefilled syringe to the injection cap. 6. Flush the catheter by pushing the plunger forward until all of the liquid from the syringe is in the catheter. 7. Remove the syringe from the injection cap. 8. Clamp the catheter. Problems and solutions  If blood cannot be completely cleared from the injection cap, you may need to have the injection cap replaced.  If the catheter is difficult to flush, use the pulsing method. The pulsing method involves pushing only a few milliliters of solution into the catheter at a time and pausing between pushes.  If you do not see blood in the catheter when you pull back on the syringe, change your body position, such as by raising your arms above your head. Take a deep breath and cough. Then, pull back on the syringe. If you still do not see blood, flush the catheter with a small amount of solution. Then, change positions again and take a breath or cough. Pull back on the syringe again. If you still do not   see blood, finish flushing the catheter and contact your health care provider. Do not use your catheter until your health care provider says it is okay. General tips  Have someone help you flush your catheter, if possible.  Do not force fluid through your catheter.  Do not use a syringe that is larger or smaller than 10 mL. Using a smaller syringe can make the catheter burst.  Do not use your catheter without flushing it first if it has heparin in it. Contact a health  care provider if:  You cannot see any blood in the catheter when you flush it before using it.  Your catheter is difficult to flush. Get help right away if:  You cannot flush the catheter.  The catheter leaks when you flush it or when there is fluid in it.  There are cracks or breaks in the catheter. Summary  It is important to flush your tunneled central venous catheter each time you use it, both before and after you use it.  Scrub the injection cap for 15 seconds with a disinfecting wipe before and after you flush it.  When you flush your catheter, make sure you follow any specific instructions from your health care provider or the manufacturer.  Get help right away if you cannot flush the catheter. This information is not intended to replace advice given to you by your health care provider. Make sure you discuss any questions you have with your health care provider. Document Revised: 06/25/2019 Document Reviewed: 07/02/2018 Elsevier Patient Education  2021 [employer] Inc.  

## 2020-09-13 ENCOUNTER — Other Ambulatory Visit: Payer: Self-pay

## 2020-09-13 ENCOUNTER — Inpatient Hospital Stay: Payer: Medicare HMO

## 2020-09-13 ENCOUNTER — Encounter: Payer: Self-pay | Admitting: Hematology & Oncology

## 2020-09-13 ENCOUNTER — Inpatient Hospital Stay (HOSPITAL_BASED_OUTPATIENT_CLINIC_OR_DEPARTMENT_OTHER): Payer: Medicare HMO | Admitting: Hematology & Oncology

## 2020-09-13 ENCOUNTER — Inpatient Hospital Stay: Payer: Medicare HMO | Attending: Hematology & Oncology

## 2020-09-13 ENCOUNTER — Telehealth: Payer: Self-pay

## 2020-09-13 VITALS — BP 123/70 | HR 64 | Temp 98.6°F | Resp 18 | Wt 220.5 lb

## 2020-09-13 DIAGNOSIS — Z452 Encounter for adjustment and management of vascular access device: Secondary | ICD-10-CM | POA: Diagnosis not present

## 2020-09-13 DIAGNOSIS — C20 Malignant neoplasm of rectum: Secondary | ICD-10-CM

## 2020-09-13 DIAGNOSIS — Z5111 Encounter for antineoplastic chemotherapy: Secondary | ICD-10-CM | POA: Diagnosis present

## 2020-09-13 DIAGNOSIS — Z5112 Encounter for antineoplastic immunotherapy: Secondary | ICD-10-CM | POA: Diagnosis not present

## 2020-09-13 LAB — CBC WITH DIFFERENTIAL (CANCER CENTER ONLY)
Abs Immature Granulocytes: 0.05 10*3/uL (ref 0.00–0.07)
Basophils Absolute: 0 10*3/uL (ref 0.0–0.1)
Basophils Relative: 1 %
Eosinophils Absolute: 0.2 10*3/uL (ref 0.0–0.5)
Eosinophils Relative: 3 %
HCT: 30.9 % — ABNORMAL LOW (ref 39.0–52.0)
Hemoglobin: 10.9 g/dL — ABNORMAL LOW (ref 13.0–17.0)
Immature Granulocytes: 1 %
Lymphocytes Relative: 17 %
Lymphs Abs: 1.1 10*3/uL (ref 0.7–4.0)
MCH: 33.5 pg (ref 26.0–34.0)
MCHC: 35.3 g/dL (ref 30.0–36.0)
MCV: 95.1 fL (ref 80.0–100.0)
Monocytes Absolute: 0.8 10*3/uL (ref 0.1–1.0)
Monocytes Relative: 12 %
Neutro Abs: 4.1 10*3/uL (ref 1.7–7.7)
Neutrophils Relative %: 66 %
Platelet Count: 190 10*3/uL (ref 150–400)
RBC: 3.25 MIL/uL — ABNORMAL LOW (ref 4.22–5.81)
RDW: 16.6 % — ABNORMAL HIGH (ref 11.5–15.5)
WBC Count: 6.2 10*3/uL (ref 4.0–10.5)
nRBC: 0 % (ref 0.0–0.2)

## 2020-09-13 LAB — CMP (CANCER CENTER ONLY)
ALT: 8 U/L (ref 0–44)
AST: 11 U/L — ABNORMAL LOW (ref 15–41)
Albumin: 4.1 g/dL (ref 3.5–5.0)
Alkaline Phosphatase: 53 U/L (ref 38–126)
Anion gap: 7 (ref 5–15)
BUN: 22 mg/dL (ref 8–23)
CO2: 31 mmol/L (ref 22–32)
Calcium: 9.9 mg/dL (ref 8.9–10.3)
Chloride: 104 mmol/L (ref 98–111)
Creatinine: 1.22 mg/dL (ref 0.61–1.24)
GFR, Estimated: >60 mL/min (ref >60)
Glucose, Bld: 141 mg/dL — ABNORMAL HIGH (ref 70–99)
Potassium: 3.5 mmol/L (ref 3.5–5.1)
Sodium: 142 mmol/L (ref 135–145)
Total Bilirubin: 0.9 mg/dL (ref 0.3–1.2)
Total Protein: 5.9 g/dL — ABNORMAL LOW (ref 6.5–8.1)

## 2020-09-13 LAB — TOTAL PROTEIN, URINE DIPSTICK: Protein, ur: NEGATIVE mg/dL

## 2020-09-13 MED ORDER — SODIUM CHLORIDE 0.9 % IV SOLN
2325.0000 mg/m2 | INTRAVENOUS | Status: DC
  Administered 2020-09-13: 5000 mg via INTRAVENOUS
  Filled 2020-09-13: qty 100

## 2020-09-13 MED ORDER — SODIUM CHLORIDE 0.9 % IV SOLN
Freq: Once | INTRAVENOUS | Status: DC
  Filled 2020-09-13: qty 250

## 2020-09-13 MED ORDER — SODIUM CHLORIDE 0.9 % IV SOLN
7.5000 mg/kg | Freq: Once | INTRAVENOUS | Status: AC
  Administered 2020-09-13: 700 mg via INTRAVENOUS
  Filled 2020-09-13: qty 16

## 2020-09-13 MED ORDER — DEXTROSE 5 % IV SOLN
Freq: Once | INTRAVENOUS | Status: DC
  Filled 2020-09-13: qty 250

## 2020-09-13 MED ORDER — FLUOROURACIL CHEMO INJECTION 2.5 GM/50ML
400.0000 mg/m2 | Freq: Once | INTRAVENOUS | Status: AC
  Administered 2020-09-13: 850 mg via INTRAVENOUS
  Filled 2020-09-13: qty 17

## 2020-09-13 MED ORDER — SODIUM CHLORIDE 0.9 % IV SOLN
Freq: Once | INTRAVENOUS | Status: AC
Start: 2020-09-13 — End: 2020-09-13
  Filled 2020-09-13: qty 250

## 2020-09-13 MED ORDER — SODIUM CHLORIDE 0.9 % IV SOLN
400.0000 mg/m2 | Freq: Once | INTRAVENOUS | Status: AC
  Administered 2020-09-13: 864 mg via INTRAVENOUS
  Filled 2020-09-13: qty 43.2

## 2020-09-13 NOTE — Patient Instructions (Signed)

## 2020-09-13 NOTE — Telephone Encounter (Signed)
appts made per 09/13/20 los, pt to gain sch in tx/avs and through [employer]

## 2020-09-13 NOTE — Progress Notes (Signed)
Hematology and Oncology Follow Up Visit  John Douglas [id number] January 13, 1956 65 y.o. 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 twice daily   Aspirin 81 mg p.o. daily  FOLFOX --  S/p cycle #10-- started on 11/17/2019  XRT/Xeloda -- start on 04/01/2020 -- completed 04/27/2020  5-FU/Avastin -- maintenance -- s/p cycle #1 - start on 08/22/2020     Interim History:  John Douglas is in for follow-up.  He looks pretty good.  He feels okay.  I feel that the real positive right now is that he does not have atrial fibrillation.  He is on Eliquis.  He has had no problems with abdominal pain.  He has had no change in bowel or bladder habits.  His blood sugars are up and down.  He does be a little bit liberal with what he eats.  He has had no problems with fever.  There has been no leg swelling.  He has had no bleeding.  There has been no headache.  He has had no neurological issues.  There has been no problems with cough or shortness of breath.  Currently, his performance status is ECOG 1.     Medications:  Current Outpatient Medications:  .  apixaban (ELIQUIS) 5 MG TABS tablet, Take 1 tablet (5 mg total) by mouth 2 (two) times daily., Disp: 60 tablet, Rfl: 5 .  diltiazem (CARDIZEM CD) 180 MG 24 hr capsule, Take 1 capsule (180 mg total) by mouth daily., Disp: 90 capsule, Rfl: 1 .  ezetimibe (ZETIA) 10 MG tablet, Take 1 tablet by mouth once daily, Disp: 90 tablet, Rfl: 3 .  furosemide (LASIX) 40 MG tablet, TAKE 1 TABLET BY MOUTH EVERY DAY, Disp: 90 tablet, Rfl: 0 .  gabapentin (NEURONTIN) 100 MG capsule, TAKE 1 CAPSULE BY MOUTH AT BEDTIME., Disp: 30 capsule, Rfl: 3 .  glimepiride (AMARYL) 2 MG tablet, Take 2 mg by mouth daily., Disp: , Rfl:  .  glucose blood test strip, Monitor BS before meals and at bedtime for a  couple of weeks. Then may decrease to bid before meals, Disp: 100 each, Rfl: 12 .  KLOR-CON M20 20 MEQ tablet, TAKE 1 TABLET BY MOUTH EVERY DAY, Disp: 90 tablet, Rfl: 0 .  Lancets (ONETOUCH ULTRASOFT) lancets, Use as instructed, Disp: 100 each, Rfl: 12 .  lidocaine-prilocaine (EMLA) cream, Apply to affected area once, Disp: 30 g, Rfl: 3 .  metoprolol succinate (TOPROL-XL) 100 MG 24 hr tablet, Take 1 tablet (100 mg total) by mouth daily. Take with or immediately following a meal., Disp: 90 tablet, Rfl: 1 .  nitroGLYCERIN (NITROSTAT) 0.4 MG SL tablet, Place 0.4 mg under the tongue every 5 (five) minutes as needed for chest pain. , Disp: , Rfl:  .  rosuvastatin (CRESTOR) 20 MG tablet, TAKE 1 TABLET BY MOUTH EVERY DAY, Disp: 90 tablet, Rfl: 2 .  sertraline (ZOLOFT) 50 MG tablet, TAKE 1 TABLET BY MOUTH EVERY DAY, Disp: 90 tablet, Rfl: 0  Allergies:  Allergies  Allergen Reactions  . Celexa [Citalopram] Other (See Comments)    Contraindicated with A-Fib  . Ibuprofen Other (See Comments)    Was told to not take this   . Metformin And Related Other (See Comments)    Bloody stools   . Naproxen Other (See Comments)    Was told to not  take this   . Yellow Jacket Venom [Bee Venom] Swelling and Other (See Comments)    Severe swelling where stung  . Penicillins Hives    Did it involve swelling of the face/tongue/throat, SOB, or low BP? Unk Did it involve sudden or severe rash/hives, skin peeling, or any reaction on the inside of your mouth or nose? Yes Did you need to seek medical attention at a hospital or doctor's office? Unk When did it last happen?"Childhood- 55 years ago" If all above answers are "NO", may proceed with cephalosporin use.     Past Medical History, Surgical history, Social history, and Family History were reviewed and updated.  Review of Systems: Review of Systems  Constitutional: Negative.   HENT:  Negative.   Eyes: Negative.   Respiratory: Negative.   Cardiovascular:  Positive for palpitations.  Gastrointestinal: Positive for blood in stool.  Endocrine: Negative.   Genitourinary: Negative.    Skin: Negative.   Neurological: Positive for extremity weakness.  Hematological: Negative.   Psychiatric/Behavioral: Negative.     Physical Exam:  weight is 220 lb 8 oz (100 kg). His oral temperature is 98.6 F (37 C). His blood pressure is 123/70 and his pulse is 64. His respiration is 18 and oxygen saturation is 98%.   Wt Readings from Last 3 Encounters:  09/13/20 220 lb 8 oz (100 kg)  08/17/20 223 lb (101.2 kg)  08/15/20 218 lb (98.9 kg)    Physical Exam Vitals reviewed.  HENT:     Head: Normocephalic and atraumatic.  Eyes:     Pupils: Pupils are equal, round, and reactive to light.  Cardiovascular:     Rate and Rhythm: Normal rate and regular rhythm.     Heart sounds: Normal heart sounds.     Comments: Cardiac exam shows an irregular rate and irregular rhythm consistent with atrial fibrillation.  The rate is fairly well controlled.   There are no murmurs, rubs or bruits. Pulmonary:     Effort: Pulmonary effort is normal.     Breath sounds: Normal breath sounds.  Abdominal:     General: Bowel sounds are normal.     Palpations: Abdomen is soft.     Comments: Abdominal exam is soft.  He has decent bowel sounds.  There is no guarding or rebound tenderness.  There is no fluid wave.  There is no palpable liver or spleen tip.  Musculoskeletal:        General: No tenderness or deformity. Normal range of motion.     Cervical back: Normal range of motion.  Lymphadenopathy:     Cervical: No cervical adenopathy.  Skin:    General: Skin is warm and dry.     Findings: No erythema or rash.  Neurological:     Mental Status: He is alert and oriented to person, place, and time.  Psychiatric:        Behavior: Behavior normal.        Thought Content: Thought content normal.        Judgment: Judgment normal.      Lab Results  Component Value Date   WBC  6.2 09/13/2020   HGB 10.9 (L) 09/13/2020   HCT 30.9 (L) 09/13/2020   MCV 95.1 09/13/2020   PLT 190 09/13/2020     Chemistry      Component Value Date/Time   NA 141 08/24/2020 1146   NA 140 08/28/2019 0940   K 3.3 (L) 08/24/2020 1146   CL 102 08/24/2020 1146  CO2 31 08/24/2020 1146   BUN 20 08/24/2020 1146   BUN 21 08/28/2019 0940   CREATININE 1.07 08/24/2020 1146      Component Value Date/Time   CALCIUM 9.8 08/24/2020 1146   ALKPHOS 53 08/24/2020 1146   AST 13 (L) 08/24/2020 1146   ALT 12 08/24/2020 1146   BILITOT 1.1 08/24/2020 1146      Impression and Plan: Mr. Cranshaw is a very nice 65 year old white male.  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  He had a rectal bleeding.  He subsequently was found to have rectal cancer.  He had adenopathy that was distant to the rectum.  We now have him on maintenance therapy.  He is on a 5-FU pump along with Avastin.  Again we might want to think about switch him over to Xeloda.  This may not be a bad idea.  It certainly would be easier for him.  With the summer coming up, and he being very busy, I think a pill was certainly be much more amenable.  At this point, we will plan to get him back in 3 weeks.  At that time, we will just do Avastin and then we will see about getting him set up with Xeloda.       Volanda Napoleon, MD 5/17/20229:37 AM

## 2020-09-14 LAB — IRON AND TIBC
Iron: 66 ug/dL (ref 42–163)
Saturation Ratios: 22 % (ref 20–55)
TIBC: 300 ug/dL (ref 202–409)
UIBC: 234 ug/dL (ref 117–376)

## 2020-09-14 LAB — FERRITIN: Ferritin: 324 ng/mL (ref 24–336)

## 2020-09-14 LAB — CEA (IN HOUSE-CHCC): CEA (CHCC-In House): 1.03 ng/mL (ref 0.00–5.00)

## 2020-09-15 ENCOUNTER — Inpatient Hospital Stay: Payer: Medicare HMO

## 2020-09-15 ENCOUNTER — Other Ambulatory Visit: Payer: Self-pay

## 2020-09-15 VITALS — BP 151/69 | HR 70 | Temp 98.6°F | Resp 18

## 2020-09-15 DIAGNOSIS — Z5112 Encounter for antineoplastic immunotherapy: Secondary | ICD-10-CM | POA: Diagnosis not present

## 2020-09-15 DIAGNOSIS — C20 Malignant neoplasm of rectum: Secondary | ICD-10-CM

## 2020-09-15 MED ORDER — SODIUM CHLORIDE 0.9% FLUSH
10.0000 mL | Freq: Once | INTRAVENOUS | Status: AC
Start: 2020-09-15 — End: 2020-09-15
  Administered 2020-09-15: 10 mL via INTRAVENOUS
  Filled 2020-09-15: qty 10

## 2020-09-15 MED ORDER — HEPARIN SOD (PORK) LOCK FLUSH 100 UNIT/ML IV SOLN
500.0000 [IU] | Freq: Once | INTRAVENOUS | Status: AC
  Administered 2020-09-15: 500 [IU] via INTRAVENOUS
  Filled 2020-09-15: qty 5

## 2020-09-15 NOTE — Patient Instructions (Signed)
Implanted Port Insertion, Care After This sheet gives you information about how to care for yourself after your procedure. Your health care provider may also give you more specific instructions. If you have problems or questions, contact your health care provider. What can I expect after the procedure? After the procedure, it is common to have:  Discomfort at the port insertion site.  Bruising on the skin over the port. This should improve over 3-4 days. Follow these instructions at home: Port care  After your port is placed, you will get a manufacturer's information card. The card has information about your port. Keep this card with you at all times.  Take care of the port as told by your health care provider. Ask your health care provider if you or a family member can get training for taking care of the port at home. A home health care nurse may also take care of the port.  Make sure to remember what type of port you have. Incision care  Follow instructions from your health care provider about how to take care of your port insertion site. Make sure you: ? Wash your hands with soap and water before and after you change your bandage (dressing). If soap and water are not available, use hand sanitizer. ? Change your dressing as told by your health care provider. ? Leave stitches (sutures), skin glue, or adhesive strips in place. These skin closures may need to stay in place for 2 weeks or longer. If adhesive strip edges start to loosen and curl up, you may trim the loose edges. Do not remove adhesive strips completely unless your health care provider tells you to do that.  Check your port insertion site every day for signs of infection. Check for: ? Redness, swelling, or pain. ? Fluid or blood. ? Warmth. ? Pus or a bad smell.      Activity  Return to your normal activities as told by your health care provider. Ask your health care provider what activities are safe for you.  Do not  lift anything that is heavier than 10 lb (4.5 kg), or the limit that you are told, until your health care provider says that it is safe. General instructions  Take over-the-counter and prescription medicines only as told by your health care provider.  Do not take baths, swim, or use a hot tub until your health care provider approves. Ask your health care provider if you may take showers. You may only be allowed to take sponge baths.  Do not drive for 24 hours if you were given a sedative during your procedure.  Wear a medical alert bracelet in case of an emergency. This will tell any health care providers that you have a port.  Keep all follow-up visits as told by your health care provider. This is important. Contact a health care provider if:  You cannot flush your port with saline as directed, or you cannot draw blood from the port.  You have a fever or chills.  You have redness, swelling, or pain around your port insertion site.  You have fluid or blood coming from your port insertion site.  Your port insertion site feels warm to the touch.  You have pus or a bad smell coming from the port insertion site. Get help right away if:  You have chest pain or shortness of breath.  You have bleeding from your port that you cannot control. Summary  Take care of the port as told by your   health care provider. Keep the manufacturer's information card with you at all times.  Change your dressing as told by your health care provider.  Contact a health care provider if you have a fever or chills or if you have redness, swelling, or pain around your port insertion site.  Keep all follow-up visits as told by your health care provider. This information is not intended to replace advice given to you by your health care provider. Make sure you discuss any questions you have with your health care provider. Document Revised: 11/12/2017 Document Reviewed: 11/12/2017 Elsevier Patient Education   2021 Elsevier Inc[employer]  

## 2020-10-03 ENCOUNTER — Telehealth: Payer: Self-pay | Admitting: *Deleted

## 2020-10-03 NOTE — Telephone Encounter (Signed)
Called and lvm with patient of rescheduled  appointment per voicemail that was requested

## 2020-10-04 ENCOUNTER — Inpatient Hospital Stay: Payer: Medicare HMO | Admitting: Family

## 2020-10-04 ENCOUNTER — Inpatient Hospital Stay: Payer: Medicare HMO

## 2020-10-04 ENCOUNTER — Other Ambulatory Visit: Payer: Self-pay | Admitting: Internal Medicine

## 2020-10-11 ENCOUNTER — Other Ambulatory Visit: Payer: Medicare HMO

## 2020-10-11 ENCOUNTER — Ambulatory Visit: Payer: Medicare HMO | Admitting: Family

## 2020-10-12 ENCOUNTER — Inpatient Hospital Stay (HOSPITAL_BASED_OUTPATIENT_CLINIC_OR_DEPARTMENT_OTHER): Payer: Medicare HMO | Admitting: Family

## 2020-10-12 ENCOUNTER — Encounter: Payer: Self-pay | Admitting: Family

## 2020-10-12 ENCOUNTER — Other Ambulatory Visit: Payer: Self-pay | Admitting: Hematology & Oncology

## 2020-10-12 ENCOUNTER — Other Ambulatory Visit: Payer: Self-pay

## 2020-10-12 ENCOUNTER — Inpatient Hospital Stay: Payer: Medicare HMO | Attending: Hematology & Oncology

## 2020-10-12 ENCOUNTER — Inpatient Hospital Stay: Payer: Medicare HMO

## 2020-10-12 ENCOUNTER — Other Ambulatory Visit (HOSPITAL_COMMUNITY): Payer: Self-pay

## 2020-10-12 VITALS — BP 140/82 | HR 64 | Temp 98.2°F | Wt 215.8 lb

## 2020-10-12 DIAGNOSIS — Z5112 Encounter for antineoplastic immunotherapy: Secondary | ICD-10-CM | POA: Diagnosis not present

## 2020-10-12 DIAGNOSIS — Z7982 Long term (current) use of aspirin: Secondary | ICD-10-CM | POA: Insufficient documentation

## 2020-10-12 DIAGNOSIS — C20 Malignant neoplasm of rectum: Secondary | ICD-10-CM | POA: Insufficient documentation

## 2020-10-12 DIAGNOSIS — C775 Secondary and unspecified malignant neoplasm of intrapelvic lymph nodes: Secondary | ICD-10-CM

## 2020-10-12 DIAGNOSIS — Z7901 Long term (current) use of anticoagulants: Secondary | ICD-10-CM | POA: Insufficient documentation

## 2020-10-12 DIAGNOSIS — I4891 Unspecified atrial fibrillation: Secondary | ICD-10-CM | POA: Insufficient documentation

## 2020-10-12 LAB — LACTATE DEHYDROGENASE: LDH: 192 U/L (ref 98–192)

## 2020-10-12 LAB — CBC WITH DIFFERENTIAL (CANCER CENTER ONLY)
Abs Immature Granulocytes: 0.05 10*3/uL (ref 0.00–0.07)
Basophils Absolute: 0.1 10*3/uL (ref 0.0–0.1)
Basophils Relative: 1 %
Eosinophils Absolute: 0.2 10*3/uL (ref 0.0–0.5)
Eosinophils Relative: 2 %
HCT: 36.5 % — ABNORMAL LOW (ref 39.0–52.0)
Hemoglobin: 12.8 g/dL — ABNORMAL LOW (ref 13.0–17.0)
Immature Granulocytes: 1 %
Lymphocytes Relative: 19 %
Lymphs Abs: 1.8 10*3/uL (ref 0.7–4.0)
MCH: 33.2 pg (ref 26.0–34.0)
MCHC: 35.1 g/dL (ref 30.0–36.0)
MCV: 94.6 fL (ref 80.0–100.0)
Monocytes Absolute: 0.7 10*3/uL (ref 0.1–1.0)
Monocytes Relative: 8 %
Neutro Abs: 6.4 10*3/uL (ref 1.7–7.7)
Neutrophils Relative %: 69 %
Platelet Count: 180 10*3/uL (ref 150–400)
RBC: 3.86 MIL/uL — ABNORMAL LOW (ref 4.22–5.81)
RDW: 15.9 % — ABNORMAL HIGH (ref 11.5–15.5)
WBC Count: 9.2 10*3/uL (ref 4.0–10.5)
nRBC: 0 % (ref 0.0–0.2)

## 2020-10-12 LAB — CMP (CANCER CENTER ONLY)
ALT: 11 U/L (ref 0–44)
AST: 13 U/L — ABNORMAL LOW (ref 15–41)
Albumin: 4.3 g/dL (ref 3.5–5.0)
Alkaline Phosphatase: 56 U/L (ref 38–126)
Anion gap: 10 (ref 5–15)
BUN: 21 mg/dL (ref 8–23)
CO2: 28 mmol/L (ref 22–32)
Calcium: 10 mg/dL (ref 8.9–10.3)
Chloride: 103 mmol/L (ref 98–111)
Creatinine: 1.04 mg/dL (ref 0.61–1.24)
GFR, Estimated: >60 mL/min (ref >60)
Glucose, Bld: 127 mg/dL — ABNORMAL HIGH (ref 70–99)
Potassium: 3.6 mmol/L (ref 3.5–5.1)
Sodium: 141 mmol/L (ref 135–145)
Total Bilirubin: 1 mg/dL (ref 0.3–1.2)
Total Protein: 6.6 g/dL (ref 6.5–8.1)

## 2020-10-12 LAB — TOTAL PROTEIN, URINE DIPSTICK: Protein, ur: NEGATIVE mg/dL

## 2020-10-12 MED ORDER — SODIUM CHLORIDE 0.9 % IV SOLN
Freq: Once | INTRAVENOUS | Status: AC
  Filled 2020-10-12: qty 250

## 2020-10-12 MED ORDER — SODIUM CHLORIDE 0.9% FLUSH
10.0000 mL | INTRAVENOUS | Status: DC | PRN
  Administered 2020-10-12: 10 mL
  Filled 2020-10-12: qty 10

## 2020-10-12 MED ORDER — SODIUM CHLORIDE 0.9 % IV SOLN
7.5000 mg/kg | Freq: Once | INTRAVENOUS | Status: AC
  Administered 2020-10-12: 700 mg via INTRAVENOUS
  Filled 2020-10-12: qty 12

## 2020-10-12 MED ORDER — HEPARIN SOD (PORK) LOCK FLUSH 100 UNIT/ML IV SOLN
500.0000 [IU] | Freq: Once | INTRAVENOUS | Status: AC | PRN
  Administered 2020-10-12: 500 [IU]
  Filled 2020-10-12: qty 5

## 2020-10-12 NOTE — Progress Notes (Signed)
Hematology and Oncology Follow Up Visit  John Douglas [id number] 11/18/1955 65 y.o. 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 twice daily Aspirin 81 mg p.o. daily Zirabev (biosimilar Avastin)/Xeloda maintenance - started 10/12/2020   Interim History:  John Douglas is here today for follow-up and treatment. He will get Avastin only today, no 5-FU pump.  He is feeling fatigued today. He states that his daughter went into labor last night with his first granddaughter and he didn't sleep well as a result.  No fever, chills, n/v, cough, rash, dizziness, SOB, chest pain, palpitations, abdominal pain or changes in bowel or bladder habits.  He is tolerating Eliquis nicely and has had no issue with blood loss. No abnormal bruising, no petechiae.  He states that after planting seeds [place] his land  he has had some weakness in the right arm from over use as well as tenderness in the middle finger.  No swelling noted in his extremities. He denies numbness and tingling.  No falls or syncope to report.  His appetite is described as ok and he is doing his best to stay well hydrated throughout the day. His weight is 215 lbs.   ECOG Performance Status: 1 - Symptomatic but completely ambulatory  Medications:  Allergies as of 10/12/2020       Reactions   Celexa [citalopram] Other (See Comments)   Contraindicated with A-Fib   Ibuprofen Other (See Comments)   Was told to not take this    Metformin And Related Other (See Comments)   Bloody stools    Naproxen Other (See Comments)   Was told to not take this    Yellow Jacket Venom [bee Venom]  Swelling, Other (See Comments)   Severe swelling where stung   Penicillins Hives   Did it involve swelling of the face/tongue/throat, SOB, or low BP? Unk Did it involve sudden or severe rash/hives, skin peeling, or any reaction on the inside of your mouth or nose? Yes Did you need to seek medical attention at a hospital or doctor's office? Unk When did it last happen? "Childhood- 55 years ago" If all above answers are "NO", may proceed with cephalosporin use.        Medication List        Accurate as of October 12, 2020 10:42 AM. If you have any questions, ask your nurse or doctor.          diltiazem 180 MG 24 hr capsule Commonly known as: CARDIZEM CD Take 1 capsule (180 mg total) by mouth daily.   Eliquis 5 MG Tabs tablet Generic drug: apixaban TAKE 1 TABLET BY MOUTH TWICE A DAY   ezetimibe 10 MG tablet Commonly known as: ZETIA Take 1 tablet by mouth once daily   furosemide 40 MG tablet Commonly known as: LASIX TAKE 1 TABLET BY MOUTH EVERY DAY   gabapentin 100 MG capsule Commonly known as: NEURONTIN TAKE 1 CAPSULE BY MOUTH AT BEDTIME.   glimepiride 2 MG tablet Commonly known as: AMARYL Take 2 mg by mouth daily.   glucose blood test strip Monitor BS before meals and at bedtime for a couple of weeks. Then may decrease to bid before meals   Klor-Con  M20 20 MEQ tablet Generic drug: potassium chloride SA TAKE 1 TABLET BY MOUTH EVERY DAY   lidocaine-prilocaine cream Commonly known as: EMLA Apply to affected area once   metoprolol succinate 100 MG 24 hr tablet Commonly known as: TOPROL-XL Take 1 tablet (100 mg total) by mouth daily. Take with or immediately following a meal.   nitroGLYCERIN 0.4 MG SL tablet Commonly known as: NITROSTAT Place 0.4 mg under the tongue every 5 (five) minutes as needed for chest pain.   onetouch ultrasoft lancets Use as instructed   rosuvastatin 20 MG tablet Commonly known as: CRESTOR TAKE 1 TABLET BY MOUTH EVERY DAY    sertraline 50 MG tablet Commonly known as: ZOLOFT TAKE 1 TABLET BY MOUTH EVERY DAY        Allergies:  Allergies  Allergen Reactions   Celexa [Citalopram] Other (See Comments)    Contraindicated with A-Fib   Ibuprofen Other (See Comments)    Was told to not take this    Metformin And Related Other (See Comments)    Bloody stools    Naproxen Other (See Comments)    Was told to not take this    Yellow Jacket Venom [Bee Venom] Swelling and Other (See Comments)    Severe swelling where stung   Penicillins Hives    Did it involve swelling of the face/tongue/throat, SOB, or low BP? Unk Did it involve sudden or severe rash/hives, skin peeling, or any reaction on the inside of your mouth or nose? Yes Did you need to seek medical attention at a hospital or doctor's office? Unk When did it last happen? "Childhood- 55 years ago" If all above answers are "NO", may proceed with cephalosporin use.     Past Medical History, Surgical history, Social history, and Family History were reviewed and updated.  Review of Systems: All other 10 point review of systems is negative.   Physical Exam:  weight is 215 lb 12.8 oz (97.9 kg). His oral temperature is 98.2 F (36.8 C). His blood pressure is 140/82 and his pulse is 64. His oxygen saturation is 100%.   Wt Readings from Last 3 Encounters:  10/12/20 215 lb 12.8 oz (97.9 kg)  09/13/20 220 lb 8 oz (100 kg)  08/17/20 223 lb (101.2 kg)    Ocular: Sclerae unicteric, pupils equal, round and reactive to light Ear-nose-throat: Oropharynx clear, dentition fair Lymphatic: No cervical or supraclavicular adenopathy Lungs no rales or rhonchi, good excursion bilaterally Heart regular rate and rhythm, no murmur appreciated Abd soft, nontender, positive bowel sounds MSK no focal spinal tenderness, no joint edema Neuro: non-focal, well-oriented, appropriate affect Breasts: Deferred   Lab Results  Component Value Date   WBC 9.2 10/12/2020   HGB  12.8 (L) 10/12/2020   HCT 36.5 (L) 10/12/2020   MCV 94.6 10/12/2020   PLT 180 10/12/2020   Lab Results  Component Value Date   FERRITIN 324 09/13/2020   IRON 66 09/13/2020   TIBC 300 09/13/2020   UIBC 234 09/13/2020   IRONPCTSAT 22 09/13/2020   Lab Results  Component Value Date   RETICCTPCT 5.4 (H) 08/15/2020   RBC 3.86 (L) 10/12/2020   No results found for: KPAFRELGTCHN, LAMBDASER, KAPLAMBRATIO No results found for: IGGSERUM, IGA, IGMSERUM No results found for: Ronnald Ramp, A1GS, A2GS, Violet Baldy, MSPIKE, SPEI   Chemistry      Component Value Date/Time   NA 142 09/13/2020 0920   NA 140 08/28/2019 0940   K 3.5 09/13/2020 0920   CL  104 09/13/2020 0920   CO2 31 09/13/2020 0920   BUN 22 09/13/2020 0920   BUN 21 08/28/2019 0940   CREATININE 1.22 09/13/2020 0920      Component Value Date/Time   CALCIUM 9.9 09/13/2020 0920   ALKPHOS 53 09/13/2020 0920   AST 11 (L) 09/13/2020 0920   ALT 8 09/13/2020 0920   BILITOT 0.9 09/13/2020 0920       Impression and Plan: John Douglas is a pleasant 65 yo caucasian gentleman with  history of CVA secondary to atrial fib and rectal bleeding. He was diagnosed with metastatic adenocarcinoma of the rectum-K-ras wild type/BRAF wild-type/HER-2 negative/MMR proficient. He had adenopathy distant to the rectum.  He is tolerating treatment nicely so far. He will get Avastin only today and we will look at getting him started on Xeloda which will be 2 weeks on and one week off. Dr. Marin Olp will send his prescription in.  Labs forwarded to PCP Daiva Eves, MD per patient's request.  Port flush, lab and treatment every 3 weeks and follow-up in 6 weeks.  He can contact our office with any questions or concerns.   Laverna Peace, NP 6/15/202210:42 AM

## 2020-10-12 NOTE — Patient Instructions (Signed)
Implanted Port Home Guide An implanted port is a device that is placed under the skin. It is usually placed in the chest. The device can be used to give IV medicine, to take blood, or for dialysis. You may have an implanted port if: You need IV medicine that would be irritating to the small veins in your hands or arms. You need IV medicines, such as antibiotics, for a long period of time. You need IV nutrition for a long period of time. You need dialysis. When you have a port, your health care provider can choose to use the port instead of veins in your arms for these procedures. You may have fewer limitations when using a port than you would if you used other types of long-term IVs, and you will likely be able to return to normal activities afteryour incision heals. An implanted port has two main parts: Reservoir. The reservoir is the part where a needle is inserted to give medicines or draw blood. The reservoir is round. After it is placed, it appears as a small, raised area under your skin. Catheter. The catheter is a thin, flexible tube that connects the reservoir to a vein. Medicine that is inserted into the reservoir goes into the catheter and then into the vein. How is my port accessed? To access your port: A numbing cream may be placed on the skin over the port site. Your health care provider will put on a mask and sterile gloves. The skin over your port will be cleaned carefully with a germ-killing soap and allowed to dry. Your health care provider will gently pinch the port and insert a needle into it. Your health care provider will check for a blood return to make sure the port is in the vein and is not clogged. If your port needs to remain accessed to get medicine continuously (constant infusion), your health care provider will place a clear bandage (dressing) over the needle site. The dressing and needle will need to be changed every week, or as told by your health care provider. What  is flushing? Flushing helps keep the port from getting clogged. Follow instructions from your health care provider about how and when to flush the port. Ports are usually flushed with saline solution or a medicine called heparin. The need for flushing will depend on how the port is used: If the port is only used from time to time to give medicines or draw blood, the port may need to be flushed: Before and after medicines have been given. Before and after blood has been drawn. As part of routine maintenance. Flushing may be recommended every 4-6 weeks. If a constant infusion is running, the port may not need to be flushed. Throw away any syringes in a disposal container that is meant for sharp items (sharps container). You can buy a sharps container from a pharmacy, or you can make one by using an empty hard plastic bottle with a cover. How long will my port stay implanted? The port can stay in for as long as your health care provider thinks it is needed. When it is time for the port to come out, a surgery will be done to remove it. The surgery will be similar to the procedure that was done to putthe port in. Follow these instructions at home:  Flush your port as told by your health care provider. If you need an infusion over several days, follow instructions from your health care provider about how to take   care of your port site. Make sure you: Wash your hands with soap and water before you change your dressing. If soap and water are not available, use alcohol-based hand sanitizer. Change your dressing as told by your health care provider. Place any used dressings or infusion bags into a plastic bag. Throw that bag in the trash. Keep the dressing that covers the needle clean and dry. Do not get it wet. Do not use scissors or sharp objects near the tube. Keep the tube clamped, unless it is being used. Check your port site every day for signs of infection. Check for: Redness, swelling, or  pain. Fluid or blood. Pus or a bad smell. Protect the skin around the port site. Avoid wearing bra straps that rub or irritate the site. Protect the skin around your port from seat belts. Place a soft pad over your chest if needed. Bathe or shower as told by your health care provider. The site may get wet as long as you are not actively receiving an infusion. Return to your normal activities as told by your health care provider. Ask your health care provider what activities are safe for you. Carry a medical alert card or wear a medical alert bracelet at all times. This will let health care providers know that you have an implanted port in case of an emergency. Get help right away if: You have redness, swelling, or pain at the port site. You have fluid or blood coming from your port site. You have pus or a bad smell coming from the port site. You have a fever. Summary Implanted ports are usually placed in the chest for long-term IV access. Follow instructions from your health care provider about flushing the port and changing bandages (dressings). Take care of the area around your port by avoiding clothing that puts pressure on the area, and by watching for signs of infection. Protect the skin around your port from seat belts. Place a soft pad over your chest if needed. Get help right away if you have a fever or you have redness, swelling, pain, drainage, or a bad smell at the port site. This information is not intended to replace advice given to you by your health care provider. Make sure you discuss any questions you have with your healthcare provider. Document Revised: 08/31/2019 Document Reviewed: 08/31/2019 Elsevier Patient Education  2022 [employer] Inc.  

## 2020-10-13 ENCOUNTER — Telehealth: Payer: Self-pay | Admitting: *Deleted

## 2020-10-13 LAB — CEA (IN HOUSE-CHCC): CEA (CHCC-In House): 1.45 ng/mL (ref 0.00–5.00)

## 2020-10-13 NOTE — Telephone Encounter (Signed)
No 10/12/20 los

## 2020-10-14 ENCOUNTER — Encounter: Payer: Self-pay | Admitting: Hematology & Oncology

## 2020-10-20 ENCOUNTER — Other Ambulatory Visit: Payer: Self-pay | Admitting: Hematology & Oncology

## 2020-11-01 ENCOUNTER — Ambulatory Visit: Payer: Medicare HMO

## 2020-11-01 ENCOUNTER — Other Ambulatory Visit: Payer: Medicare HMO

## 2020-11-22 ENCOUNTER — Encounter: Payer: Self-pay | Admitting: Hematology & Oncology

## 2020-11-23 ENCOUNTER — Telehealth: Payer: Self-pay | Admitting: Pharmacist

## 2020-11-23 ENCOUNTER — Inpatient Hospital Stay: Payer: Medicare HMO

## 2020-11-23 ENCOUNTER — Other Ambulatory Visit (HOSPITAL_COMMUNITY): Payer: Self-pay

## 2020-11-23 ENCOUNTER — Telehealth: Payer: Self-pay | Admitting: Pharmacy Technician

## 2020-11-23 ENCOUNTER — Encounter: Payer: Self-pay | Admitting: Hematology & Oncology

## 2020-11-23 ENCOUNTER — Inpatient Hospital Stay (HOSPITAL_BASED_OUTPATIENT_CLINIC_OR_DEPARTMENT_OTHER): Payer: Medicare HMO | Admitting: Hematology & Oncology

## 2020-11-23 ENCOUNTER — Inpatient Hospital Stay: Payer: Medicare HMO | Attending: Hematology & Oncology

## 2020-11-23 ENCOUNTER — Other Ambulatory Visit: Payer: Self-pay

## 2020-11-23 VITALS — BP 108/68 | HR 63 | Temp 98.6°F | Resp 18 | Wt 221.0 lb

## 2020-11-23 VITALS — BP 115/64 | HR 54 | Resp 17

## 2020-11-23 DIAGNOSIS — C20 Malignant neoplasm of rectum: Secondary | ICD-10-CM

## 2020-11-23 DIAGNOSIS — Z7901 Long term (current) use of anticoagulants: Secondary | ICD-10-CM | POA: Insufficient documentation

## 2020-11-23 DIAGNOSIS — I4891 Unspecified atrial fibrillation: Secondary | ICD-10-CM | POA: Insufficient documentation

## 2020-11-23 DIAGNOSIS — Z5112 Encounter for antineoplastic immunotherapy: Secondary | ICD-10-CM | POA: Diagnosis not present

## 2020-11-23 DIAGNOSIS — Z7982 Long term (current) use of aspirin: Secondary | ICD-10-CM | POA: Insufficient documentation

## 2020-11-23 DIAGNOSIS — C775 Secondary and unspecified malignant neoplasm of intrapelvic lymph nodes: Secondary | ICD-10-CM

## 2020-11-23 LAB — CBC WITH DIFFERENTIAL (CANCER CENTER ONLY)
Abs Immature Granulocytes: 0.04 10*3/uL (ref 0.00–0.07)
Basophils Absolute: 0.1 10*3/uL (ref 0.0–0.1)
Basophils Relative: 1 %
Eosinophils Absolute: 0.3 10*3/uL (ref 0.0–0.5)
Eosinophils Relative: 3 %
HCT: 37.5 % — ABNORMAL LOW (ref 39.0–52.0)
Hemoglobin: 13 g/dL (ref 13.0–17.0)
Immature Granulocytes: 1 %
Lymphocytes Relative: 16 %
Lymphs Abs: 1.4 10*3/uL (ref 0.7–4.0)
MCH: 31.8 pg (ref 26.0–34.0)
MCHC: 34.7 g/dL (ref 30.0–36.0)
MCV: 91.7 fL (ref 80.0–100.0)
Monocytes Absolute: 0.6 10*3/uL (ref 0.1–1.0)
Monocytes Relative: 7 %
Neutro Abs: 6.1 10*3/uL (ref 1.7–7.7)
Neutrophils Relative %: 72 %
Platelet Count: 190 10*3/uL (ref 150–400)
RBC: 4.09 MIL/uL — ABNORMAL LOW (ref 4.22–5.81)
RDW: 15.7 % — ABNORMAL HIGH (ref 11.5–15.5)
WBC Count: 8.3 10*3/uL (ref 4.0–10.5)
nRBC: 0 % (ref 0.0–0.2)

## 2020-11-23 LAB — CMP (CANCER CENTER ONLY)
ALT: 9 U/L (ref 0–44)
AST: 13 U/L — ABNORMAL LOW (ref 15–41)
Albumin: 4.2 g/dL (ref 3.5–5.0)
Alkaline Phosphatase: 53 U/L (ref 38–126)
Anion gap: 7 (ref 5–15)
BUN: 20 mg/dL (ref 8–23)
CO2: 32 mmol/L (ref 22–32)
Calcium: 9.3 mg/dL (ref 8.9–10.3)
Chloride: 102 mmol/L (ref 98–111)
Creatinine: 1.07 mg/dL (ref 0.61–1.24)
GFR, Estimated: >60 mL/min (ref >60)
Glucose, Bld: 207 mg/dL — ABNORMAL HIGH (ref 70–99)
Potassium: 3.4 mmol/L — ABNORMAL LOW (ref 3.5–5.1)
Sodium: 141 mmol/L (ref 135–145)
Total Bilirubin: 0.9 mg/dL (ref 0.3–1.2)
Total Protein: 6.9 g/dL (ref 6.5–8.1)

## 2020-11-23 LAB — LACTATE DEHYDROGENASE: LDH: 180 U/L (ref 98–192)

## 2020-11-23 LAB — CEA (IN HOUSE-CHCC): CEA (CHCC-In House): 1.14 ng/mL (ref 0.00–5.00)

## 2020-11-23 MED ORDER — SODIUM CHLORIDE 0.9 % IV SOLN
7.5000 mg/kg | Freq: Once | INTRAVENOUS | Status: AC
  Administered 2020-11-23: 700 mg via INTRAVENOUS
  Filled 2020-11-23: qty 16

## 2020-11-23 MED ORDER — SODIUM CHLORIDE 0.9 % IV SOLN
Freq: Once | INTRAVENOUS | Status: DC
  Filled 2020-11-23: qty 250

## 2020-11-23 MED ORDER — CAPECITABINE 500 MG PO TABS
1000.0000 mg/m2 | ORAL_TABLET | Freq: Two times a day (BID) | ORAL | 6 refills | Status: DC
  Filled 2020-11-23: qty 112, 14d supply, fill #0

## 2020-11-23 MED ORDER — SODIUM CHLORIDE 0.9% FLUSH
10.0000 mL | INTRAVENOUS | Status: DC | PRN
  Administered 2020-11-23: 10 mL
  Filled 2020-11-23: qty 10

## 2020-11-23 MED ORDER — HEPARIN SOD (PORK) LOCK FLUSH 100 UNIT/ML IV SOLN
500.0000 [IU] | Freq: Once | INTRAVENOUS | Status: AC | PRN
  Administered 2020-11-23: 500 [IU]
  Filled 2020-11-23: qty 5

## 2020-11-23 NOTE — Telephone Encounter (Signed)
Oral Chemotherapy Pharmacist Encounter  Patient Education I spoke with patient for overview of new oral chemotherapy medication: Xeloda (capecitabine) for the maintenance treatment of metastatic rectal cancer in conjunction with bevacizumab, planned duration until disease progression or unacceptable toxicity.  Pt is doing well. Counseled patient on administration, dosing, side effects, monitoring, drug-food interactions, safe handling, storage, and disposal. Patient will take 4 tablets (2,000 mg total) by mouth 2 (two) times daily after a meal. Take for 14 days, then hold for 7 days. Repeat every 21 days.  Side effects include but not limited to: diarrhea, hand-foot syndrome, mouth sores, edema, decreased wbc, fatigue, N/V.    Reviewed with patient importance of keeping a medication schedule and plan for any missed doses.  After discussion with patient no patient barriers to medication adherence identified. He has taking capecitabine previously.  John Douglas voiced understanding and appreciation. All questions answered. Medication handout provided.  Provided patient with [facility] phone number. Patient knows to call the office with questions or concerns. [facility] will continue to follow.  Darl Pikes, PharmD, BCPS, BCOP, CPP Hematology/Oncology Clinical Pharmacist Practitioner [facility]/HP/AP [facility] [phone]  11/24/2020 12:23 PM

## 2020-11-23 NOTE — Telephone Encounter (Signed)
Oral Oncology Patient Advocate Encounter  After completing a benefits investigation, prior authorization for Xeloda is not required at this time through [facility].  Patient's copay is $10.00  [facility] [place] Phone [phone] Fax [phone] 11/23/2020 4:22 PM

## 2020-11-23 NOTE — Progress Notes (Signed)
Hematology and Oncology Follow Up Visit  John Douglas [id number] June 08, 1955 65 y.o. 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 twice daily Aspirin 81 mg p.o. daily Zirabev (biosimilar Avastin)/Xeloda maintenance - started 10/12/2020   Interim History:  John Douglas is here today for follow-up and treatment.  Unfortunately, he has not yet started the Xeloda.  Somehow, the order never went through.  We will have to get him on Xeloda.  I will put him on 2000 mg p.o. twice daily for 14 days on and 7 days off.  I really think that this is going be important for him.  He has had some problems with arthritis.  He has a lot of swelling and pain in his right hand.  He does have arthritis in the family.  I will think this is anything related to the Avastin.  He has had no problems with bowels or bladder.  He is on Eliquis.  He has had no bleeding.  There is been no problems with pain.  He has had no cough or shortness of breath.  Is been no issues with headache.  He is active.  He just got back from the beach.  He is quite busy around the house.  His last CEA level today was 1.14.  Overall, I would say his performance status is ECOG 1.     Medications:  Allergies as of 11/23/2020       Reactions   Celexa [citalopram] Other (See Comments)   Contraindicated with A-Fib   Ibuprofen Other (See Comments)   Was told to not take this    Metformin And Related Other (See Comments)   Bloody stools    Naproxen Other (See Comments)   Was told to not take this    Yellow Jacket Venom [bee Venom] Swelling, Other (See Comments)   Severe swelling  where stung   Penicillins Hives   Did it involve swelling of the face/tongue/throat, SOB, or low BP? Unk Did it involve sudden or severe rash/hives, skin peeling, or any reaction on the inside of your mouth or nose? Yes Did you need to seek medical attention at a hospital or doctor's office? Unk When did it last happen? "Childhood- 55 years ago" If all above answers are "NO", may proceed with cephalosporin use.        Medication List        Accurate as of November 23, 2020  9:25 AM. If you have any questions, ask your nurse or doctor.          diltiazem 180 MG 24 hr capsule Commonly known as: CARDIZEM CD Take 1 capsule (180 mg total) by mouth daily.   Eliquis 5 MG Tabs tablet Generic drug: apixaban TAKE 1 TABLET BY MOUTH TWICE A DAY   ezetimibe 10 MG tablet Commonly known as: ZETIA Take 1 tablet by mouth once daily   furosemide 40 MG tablet Commonly known as: LASIX TAKE 1 TABLET BY MOUTH EVERY DAY   gabapentin 100 MG capsule Commonly known as: NEURONTIN TAKE 1 CAPSULE BY MOUTH AT BEDTIME.   glimepiride 2 MG tablet Commonly known as: AMARYL Take 2 mg by mouth daily.   glucose blood test strip  Monitor BS before meals and at bedtime for a couple of weeks. Then may decrease to bid before meals   Klor-Con M20 20 MEQ tablet Generic drug: potassium chloride SA TAKE 1 TABLET BY MOUTH EVERY DAY   lidocaine-prilocaine cream Commonly known as: EMLA Apply to affected area once   metoprolol succinate 100 MG 24 hr tablet Commonly known as: TOPROL-XL Take 1 tablet (100 mg total) by mouth daily. Take with or immediately following a meal.   nitroGLYCERIN 0.4 MG SL tablet Commonly known as: NITROSTAT Place 0.4 mg under the tongue every 5 (five) minutes as needed for chest pain.   onetouch ultrasoft lancets Use as instructed   rosuvastatin 20 MG tablet Commonly known as: CRESTOR TAKE 1 TABLET BY MOUTH EVERY DAY   sertraline 50 MG tablet Commonly known as: ZOLOFT TAKE  1 TABLET BY MOUTH EVERY DAY        Allergies:  Allergies  Allergen Reactions   Celexa [Citalopram] Other (See Comments)    Contraindicated with A-Fib   Ibuprofen Other (See Comments)    Was told to not take this    Metformin And Related Other (See Comments)    Bloody stools    Naproxen Other (See Comments)    Was told to not take this    Yellow Jacket Venom [Bee Venom] Swelling and Other (See Comments)    Severe swelling where stung   Penicillins Hives    Did it involve swelling of the face/tongue/throat, SOB, or low BP? Unk Did it involve sudden or severe rash/hives, skin peeling, or any reaction on the inside of your mouth or nose? Yes Did you need to seek medical attention at a hospital or doctor's office? Unk When did it last happen? "Childhood- 55 years ago" If all above answers are "NO", may proceed with cephalosporin use.     Past Medical History, Surgical history, Social history, and Family History were reviewed and updated.  Review of Systems: . Review of Systems  Constitutional: Negative.   HENT: Negative.    Eyes: Negative.   Respiratory: Negative.    Cardiovascular: Negative.   Gastrointestinal: Negative.   Genitourinary: Negative.   Musculoskeletal:  Positive for joint pain.  Skin: Negative.   Neurological: Negative.   Endo/Heme/Allergies: Negative.   Psychiatric/Behavioral: Negative.      Physical Exam:  weight is 221 lb (100.2 kg). His oral temperature is 98.6 F (37 C). His blood pressure is 108/68 and his pulse is 63. His respiration is 18 and oxygen saturation is 100%.   Wt Readings from Last 3 Encounters:  11/23/20 221 lb (100.2 kg)  10/12/20 215 lb 12.8 oz (97.9 kg)  09/13/20 220 lb 8 oz (100 kg)    Physical Exam Vitals reviewed.  HENT:     Head: Normocephalic and atraumatic.  Eyes:     Pupils: Pupils are equal, round, and reactive to light.  Cardiovascular:     Rate and Rhythm: Normal rate and regular rhythm.     Heart sounds:  Normal heart sounds.  Pulmonary:     Effort: Pulmonary effort is normal.     Breath sounds: Normal breath sounds.  Abdominal:     General: Bowel sounds are normal.     Palpations: Abdomen is soft.  Musculoskeletal:        General: No tenderness or deformity. Normal range of motion.     Cervical back: Normal range of motion.     Comments: He does have some swelling in the fingers of  his right hand.  The main finger is his second finger.  This is quite swollen.  He does have some limited range of motion of his fingers.  It is hard for him to make a fist.  Lymphadenopathy:     Cervical: No cervical adenopathy.  Skin:    General: Skin is warm and dry.     Findings: No erythema or rash.  Neurological:     Mental Status: He is alert and oriented to person, place, and time.  Psychiatric:        Behavior: Behavior normal.        Thought Content: Thought content normal.        Judgment: Judgment normal.    Lab Results  Component Value Date   WBC 8.3 11/23/2020   HGB 13.0 11/23/2020   HCT 37.5 (L) 11/23/2020   MCV 91.7 11/23/2020   PLT 190 11/23/2020   Lab Results  Component Value Date   FERRITIN 324 09/13/2020   IRON 66 09/13/2020   TIBC 300 09/13/2020   UIBC 234 09/13/2020   IRONPCTSAT 22 09/13/2020   Lab Results  Component Value Date   RETICCTPCT 5.4 (H) 08/15/2020   RBC 4.09 (L) 11/23/2020   No results found for: KPAFRELGTCHN, LAMBDASER, KAPLAMBRATIO No results found for: IGGSERUM, IGA, IGMSERUM No results found for: Odetta Pink, SPEI   Chemistry      Component Value Date/Time   NA 141 11/23/2020 0845   NA 140 08/28/2019 0940   K 3.4 (L) 11/23/2020 0845   CL 102 11/23/2020 0845   CO2 32 11/23/2020 0845   BUN 20 11/23/2020 0845   BUN 21 08/28/2019 0940   CREATININE 1.07 11/23/2020 0845      Component Value Date/Time   CALCIUM 9.3 11/23/2020 0845   ALKPHOS 53 11/23/2020 0845   AST 13 (L) 11/23/2020 0845    ALT 9 11/23/2020 0845   BILITOT 0.9 11/23/2020 0845       Impression and Plan: Mr. Hineman is a pleasant 65 yo caucasian gentleman with  history of CVA secondary to atrial fib and rectal bleeding. He was diagnosed with metastatic adenocarcinoma of the rectum-K-ras wild type/BRAF wild-type/HER-2 negative/MMR proficient. He had adenopathy distant to the rectum.   Has been on aggressive treatment to date.  He has had a full dose systemic chemotherapy and then chemotherapy with radiation therapy.  I have I still believe he is got nasal count of maintenance therapy.  I think Xeloda would be perfect with Avastin.  I am not sure as to why he did not get the Xeloda.  Not sure why the prescription did not go through.  Again I will put him on 2000 mg twice a day of Xeloda.  I told him about the side effects.  He understands all this well.  I told to make sure he has to wear sunscreen if he is going be outside a lot.  We will continue him on the Avastin.  I would like to get a PET scan on him.  His last PET scan was probably back in March.  I think we have to get another 1 to see how things look.  We will plan to get him back to see [place] in another month.  Volanda Napoleon, MD 7/27/20229:25 AM

## 2020-11-23 NOTE — Patient Instructions (Signed)
Implanted Port Home Guide An implanted port is a device that is placed under the skin. It is usually placed in the chest. The device can be used to give IV medicine, to take blood, or for dialysis. You may have an implanted port if: You need IV medicine that would be irritating to the small veins in your hands or arms. You need IV medicines, such as antibiotics, for a long period of time. You need IV nutrition for a long period of time. You need dialysis. When you have a port, your health care provider can choose to use the port instead of veins in your arms for these procedures. You may have fewer limitations when using a port than you would if you used other types of long-term IVs, and you will likely be able to return to normal activities afteryour incision heals. An implanted port has two main parts: Reservoir. The reservoir is the part where a needle is inserted to give medicines or draw blood. The reservoir is round. After it is placed, it appears as a small, raised area under your skin. Catheter. The catheter is a thin, flexible tube that connects the reservoir to a vein. Medicine that is inserted into the reservoir goes into the catheter and then into the vein. How is my port accessed? To access your port: A numbing cream may be placed on the skin over the port site. Your health care provider will put on a mask and sterile gloves. The skin over your port will be cleaned carefully with a germ-killing soap and allowed to dry. Your health care provider will gently pinch the port and insert a needle into it. Your health care provider will check for a blood return to make sure the port is in the vein and is not clogged. If your port needs to remain accessed to get medicine continuously (constant infusion), your health care provider will place a clear bandage (dressing) over the needle site. The dressing and needle will need to be changed every week, or as told by your health care provider. What  is flushing? Flushing helps keep the port from getting clogged. Follow instructions from your health care provider about how and when to flush the port. Ports are usually flushed with saline solution or a medicine called heparin. The need for flushing will depend on how the port is used: If the port is only used from time to time to give medicines or draw blood, the port may need to be flushed: Before and after medicines have been given. Before and after blood has been drawn. As part of routine maintenance. Flushing may be recommended every 4-6 weeks. If a constant infusion is running, the port may not need to be flushed. Throw away any syringes in a disposal container that is meant for sharp items (sharps container). You can buy a sharps container from a pharmacy, or you can make one by using an empty hard plastic bottle with a cover. How long will my port stay implanted? The port can stay in for as long as your health care provider thinks it is needed. When it is time for the port to come out, a surgery will be done to remove it. The surgery will be similar to the procedure that was done to putthe port in. Follow these instructions at home:  Flush your port as told by your health care provider. If you need an infusion over several days, follow instructions from your health care provider about how to take   care of your port site. Make sure you: Wash your hands with soap and water before you change your dressing. If soap and water are not available, use alcohol-based hand sanitizer. Change your dressing as told by your health care provider. Place any used dressings or infusion bags into a plastic bag. Throw that bag in the trash. Keep the dressing that covers the needle clean and dry. Do not get it wet. Do not use scissors or sharp objects near the tube. Keep the tube clamped, unless it is being used. Check your port site every day for signs of infection. Check for: Redness, swelling, or  pain. Fluid or blood. Pus or a bad smell. Protect the skin around the port site. Avoid wearing bra straps that rub or irritate the site. Protect the skin around your port from seat belts. Place a soft pad over your chest if needed. Bathe or shower as told by your health care provider. The site may get wet as long as you are not actively receiving an infusion. Return to your normal activities as told by your health care provider. Ask your health care provider what activities are safe for you. Carry a medical alert card or wear a medical alert bracelet at all times. This will let health care providers know that you have an implanted port in case of an emergency. Get help right away if: You have redness, swelling, or pain at the port site. You have fluid or blood coming from your port site. You have pus or a bad smell coming from the port site. You have a fever. Summary Implanted ports are usually placed in the chest for long-term IV access. Follow instructions from your health care provider about flushing the port and changing bandages (dressings). Take care of the area around your port by avoiding clothing that puts pressure on the area, and by watching for signs of infection. Protect the skin around your port from seat belts. Place a soft pad over your chest if needed. Get help right away if you have a fever or you have redness, swelling, pain, drainage, or a bad smell at the port site. This information is not intended to replace advice given to you by your health care provider. Make sure you discuss any questions you have with your healthcare provider. Document Revised: 08/31/2019 Document Reviewed: 08/31/2019 Elsevier Patient Education  2022 [employer] Inc.  

## 2020-11-24 ENCOUNTER — Telehealth: Payer: Self-pay

## 2020-11-24 ENCOUNTER — Other Ambulatory Visit (HOSPITAL_COMMUNITY): Payer: Self-pay

## 2020-11-24 MED ORDER — CAPECITABINE 500 MG PO TABS
1000.0000 mg/m2 | ORAL_TABLET | Freq: Two times a day (BID) | ORAL | 6 refills | Status: DC
  Filled 2020-11-24 (×2): qty 112, 14d supply, fill #0
  Filled ????-??-??: fill #1

## 2020-11-24 NOTE — Telephone Encounter (Signed)
Oral Oncology Pharmacy Student Encounter  Received new prescription for Xeloda (capecitabine) for the maintenance treatment of metastatic rectal cancer in conjunction with bevacizumab, planned duration until disease progression or unacceptable toxicity.  CMP from 11/23/2020 assessed, no relevant lab abnormalities. Prescription dose and frequency assessed.   Current medication list in Epic reviewed, no relevant DDIs with capecitabine identified.  Evaluated chart and no patient barriers to medication adherence identified.   Prescription has been e-scribed to the [facility] for benefits analysis and approval.  Oral Oncology Clinic will continue to follow for insurance authorization, copayment issues, initial counseling and start date.   Herby Abraham, PharmD Candidate [place] [facility], [facility] [facility] [phone]  11/24/2020 9:47 AM

## 2020-11-25 ENCOUNTER — Inpatient Hospital Stay: Payer: Medicare HMO

## 2020-11-28 ENCOUNTER — Other Ambulatory Visit (HOSPITAL_COMMUNITY): Payer: Self-pay

## 2020-11-28 NOTE — Telephone Encounter (Signed)
Oral Oncology Patient Advocate Encounter  I spoke with John Douglas on 11/24/20 to set up delivery of Xeloda.  Address verified for shipment.  Xeloda will be filled through [facility] and mailed 11/24/20 for delivery 11/25/20.    Espy will call 7-10 days before next refill is due to complete adherence call and set up delivery of medication.     [place] Patient [place] Phone [phone] Fax [phone] 11/28/2020 9:01 AM

## 2020-11-29 ENCOUNTER — Other Ambulatory Visit (HOSPITAL_COMMUNITY): Payer: Self-pay

## 2020-12-12 ENCOUNTER — Inpatient Hospital Stay (HOSPITAL_BASED_OUTPATIENT_CLINIC_OR_DEPARTMENT_OTHER): Payer: Medicare HMO | Admitting: Hematology & Oncology

## 2020-12-12 ENCOUNTER — Encounter: Payer: Self-pay | Admitting: Hematology & Oncology

## 2020-12-12 ENCOUNTER — Inpatient Hospital Stay: Payer: Medicare HMO | Attending: Hematology & Oncology

## 2020-12-12 ENCOUNTER — Other Ambulatory Visit (HOSPITAL_COMMUNITY): Payer: Self-pay

## 2020-12-12 ENCOUNTER — Other Ambulatory Visit: Payer: Self-pay

## 2020-12-12 ENCOUNTER — Inpatient Hospital Stay: Payer: Medicare HMO

## 2020-12-12 ENCOUNTER — Other Ambulatory Visit (HOSPITAL_BASED_OUTPATIENT_CLINIC_OR_DEPARTMENT_OTHER): Payer: Self-pay

## 2020-12-12 VITALS — BP 116/66 | HR 49

## 2020-12-12 VITALS — Wt 221.0 lb

## 2020-12-12 DIAGNOSIS — Z5112 Encounter for antineoplastic immunotherapy: Secondary | ICD-10-CM | POA: Diagnosis present

## 2020-12-12 DIAGNOSIS — C20 Malignant neoplasm of rectum: Secondary | ICD-10-CM | POA: Insufficient documentation

## 2020-12-12 DIAGNOSIS — Z7901 Long term (current) use of anticoagulants: Secondary | ICD-10-CM | POA: Insufficient documentation

## 2020-12-12 DIAGNOSIS — I4891 Unspecified atrial fibrillation: Secondary | ICD-10-CM | POA: Diagnosis not present

## 2020-12-12 DIAGNOSIS — Z7982 Long term (current) use of aspirin: Secondary | ICD-10-CM | POA: Diagnosis not present

## 2020-12-12 LAB — CBC WITH DIFFERENTIAL (CANCER CENTER ONLY)
Abs Immature Granulocytes: 0.12 10*3/uL — ABNORMAL HIGH (ref 0.00–0.07)
Basophils Absolute: 0.1 10*3/uL (ref 0.0–0.1)
Basophils Relative: 1 %
Eosinophils Absolute: 0.2 10*3/uL (ref 0.0–0.5)
Eosinophils Relative: 2 %
HCT: 33.8 % — ABNORMAL LOW (ref 39.0–52.0)
Hemoglobin: 11.9 g/dL — ABNORMAL LOW (ref 13.0–17.0)
Immature Granulocytes: 1 %
Lymphocytes Relative: 20 %
Lymphs Abs: 1.7 10*3/uL (ref 0.7–4.0)
MCH: 32.8 pg (ref 26.0–34.0)
MCHC: 35.2 g/dL (ref 30.0–36.0)
MCV: 93.1 fL (ref 80.0–100.0)
Monocytes Absolute: 0.6 10*3/uL (ref 0.1–1.0)
Monocytes Relative: 7 %
Neutro Abs: 6 10*3/uL (ref 1.7–7.7)
Neutrophils Relative %: 69 %
Platelet Count: 187 10*3/uL (ref 150–400)
RBC: 3.63 MIL/uL — ABNORMAL LOW (ref 4.22–5.81)
RDW: 19.2 % — ABNORMAL HIGH (ref 11.5–15.5)
WBC Count: 8.7 10*3/uL (ref 4.0–10.5)
nRBC: 0.3 % — ABNORMAL HIGH (ref 0.0–0.2)

## 2020-12-12 LAB — CMP (CANCER CENTER ONLY)
ALT: 12 U/L (ref 0–44)
AST: 14 U/L — ABNORMAL LOW (ref 15–41)
Albumin: 4.1 g/dL (ref 3.5–5.0)
Alkaline Phosphatase: 51 U/L (ref 38–126)
Anion gap: 8 (ref 5–15)
BUN: 26 mg/dL — ABNORMAL HIGH (ref 8–23)
CO2: 26 mmol/L (ref 22–32)
Calcium: 9.3 mg/dL (ref 8.9–10.3)
Chloride: 105 mmol/L (ref 98–111)
Creatinine: 1.22 mg/dL (ref 0.61–1.24)
GFR, Estimated: >60 mL/min (ref >60)
Glucose, Bld: 199 mg/dL — ABNORMAL HIGH (ref 70–99)
Potassium: 3.5 mmol/L (ref 3.5–5.1)
Sodium: 139 mmol/L (ref 135–145)
Total Bilirubin: 0.9 mg/dL (ref 0.3–1.2)
Total Protein: 6 g/dL — ABNORMAL LOW (ref 6.5–8.1)

## 2020-12-12 LAB — CEA (IN HOUSE-CHCC): CEA (CHCC-In House): 1.07 ng/mL (ref 0.00–5.00)

## 2020-12-12 LAB — TOTAL PROTEIN, URINE DIPSTICK: Protein, ur: NEGATIVE mg/dL

## 2020-12-12 LAB — IRON AND TIBC
Iron: 82 ug/dL (ref 42–163)
Saturation Ratios: 27 % (ref 20–55)
TIBC: 308 ug/dL (ref 202–409)
UIBC: 226 ug/dL (ref 117–376)

## 2020-12-12 LAB — FERRITIN: Ferritin: 307 ng/mL (ref 24–336)

## 2020-12-12 MED ORDER — SODIUM CHLORIDE 0.9 % IV SOLN
7.5000 mg/kg | Freq: Once | INTRAVENOUS | Status: AC
  Administered 2020-12-12: 700 mg via INTRAVENOUS
  Filled 2020-12-12: qty 16

## 2020-12-12 MED ORDER — HEPARIN SOD (PORK) LOCK FLUSH 100 UNIT/ML IV SOLN
500.0000 [IU] | Freq: Once | INTRAVENOUS | Status: AC | PRN
  Administered 2020-12-12: 500 [IU]
  Filled 2020-12-12: qty 5

## 2020-12-12 MED ORDER — SODIUM CHLORIDE 0.9 % IV SOLN
Freq: Once | INTRAVENOUS | Status: AC
  Filled 2020-12-12: qty 250

## 2020-12-12 MED ORDER — SODIUM CHLORIDE 0.9% FLUSH
10.0000 mL | INTRAVENOUS | Status: DC | PRN
  Administered 2020-12-12: 10 mL
  Filled 2020-12-12: qty 10

## 2020-12-12 NOTE — Progress Notes (Signed)
Hematology and Oncology Follow Up Visit  John Douglas [id number] 1955-05-15 65 y.o. 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 twice daily Aspirin 81 mg p.o. daily Zirabev (biosimilar Avastin)/Xeloda maintenance - started 10/12/2020   Interim History:  John Douglas is here today for follow-up and treatment.  He finally did get the Xeloda.  He is doing well with the Xeloda.  He has had no problems with this so far.  He has had no mouth sores.  There is no pain or peeling in the hands or feet.  He has had no diarrhea.  His last CEA level, which we did today was 1.07.  He has had no cough..  He does have the arthritic issues.  This mostly is in his right hand.  I am unsure if he is got see a orthopedist for this.  He has had no bleeding.  He has had no fever.  There has been no headache.  Overall, I would have to say his performance status is probably ECOG 1.    Medications:  Allergies as of 12/12/2020       Reactions   Celexa [citalopram] Other (See Comments)   Contraindicated with A-Fib   Ibuprofen Other (See Comments)   Was told to not take this    Metformin And Related Other (See Comments)   Bloody stools    Naproxen Other (See Comments)   Was told to not take this    Yellow Jacket Venom [bee Venom] Swelling, Other (See Comments)   Severe swelling where stung   Penicillins Hives   Did it involve swelling of the face/tongue/throat, SOB, or low BP? Unk Did it involve sudden or severe rash/hives, skin peeling, or any reaction on the inside of your mouth or nose? Yes Did you need to seek medical attention at a hospital or  doctor's office? Unk When did it last happen? "Childhood- 55 years ago" If all above answers are "NO", may proceed with cephalosporin use.        Medication List        Accurate as of December 12, 2020  2:27 PM. If you have any questions, ask your nurse or doctor.          capecitabine 500 MG tablet Commonly known as: Xeloda Take 4 tablets (2,000 mg total) by mouth 2 (two) times daily after a meal. Take for 14 days, then hold for 7 days. Repeat every 21 days.   diltiazem 180 MG 24 hr capsule Commonly known as: CARDIZEM CD Take 1 capsule (180 mg total) by mouth daily.   Eliquis 5 MG Tabs tablet Generic drug: apixaban TAKE 1 TABLET BY MOUTH TWICE A DAY   ezetimibe 10 MG tablet Commonly known as: ZETIA Take 1 tablet by mouth once daily   furosemide 40 MG tablet Commonly known as: LASIX TAKE 1 TABLET BY MOUTH EVERY DAY   gabapentin 100 MG capsule Commonly known as: NEURONTIN TAKE 1 CAPSULE BY MOUTH AT BEDTIME.   glimepiride 2 MG tablet Commonly known as: AMARYL Take 2 mg by mouth daily.   glucose blood test strip Monitor BS before meals and at bedtime for a couple of weeks. Then may decrease to bid  before meals   Klor-Con M20 20 MEQ tablet Generic drug: potassium chloride SA TAKE 1 TABLET BY MOUTH EVERY DAY   lidocaine-prilocaine cream Commonly known as: EMLA Apply to affected area once   metoprolol succinate 100 MG 24 hr tablet Commonly known as: TOPROL-XL Take 1 tablet (100 mg total) by mouth daily. Take with or immediately following a meal.   nitroGLYCERIN 0.4 MG SL tablet Commonly known as: NITROSTAT Place 0.4 mg under the tongue every 5 (five) minutes as needed for chest pain.   onetouch ultrasoft lancets Use as instructed   rosuvastatin 20 MG tablet Commonly known as: CRESTOR TAKE 1 TABLET BY MOUTH EVERY DAY   sertraline 100 MG tablet Commonly known as: ZOLOFT Take 100 mg by mouth daily. What changed: Another medication with the same name was  removed. Continue taking this medication, and follow the directions you see here. Changed by: Volanda Napoleon, MD        Allergies:  Allergies  Allergen Reactions   Celexa [Citalopram] Other (See Comments)    Contraindicated with A-Fib   Ibuprofen Other (See Comments)    Was told to not take this    Metformin And Related Other (See Comments)    Bloody stools    Naproxen Other (See Comments)    Was told to not take this    Yellow Jacket Venom [Bee Venom] Swelling and Other (See Comments)    Severe swelling where stung   Penicillins Hives    Did it involve swelling of the face/tongue/throat, SOB, or low BP? Unk Did it involve sudden or severe rash/hives, skin peeling, or any reaction on the inside of your mouth or nose? Yes Did you need to seek medical attention at a hospital or doctor's office? Unk When did it last happen? "Childhood- 55 years ago" If all above answers are "NO", may proceed with cephalosporin use.     Past Medical History, Surgical history, Social history, and Family History were reviewed and updated.  Review of Systems: . Review of Systems  Constitutional: Negative.   HENT: Negative.    Eyes: Negative.   Respiratory: Negative.    Cardiovascular: Negative.   Gastrointestinal: Negative.   Genitourinary: Negative.   Musculoskeletal:  Positive for joint pain.  Skin: Negative.   Neurological: Negative.   Endo/Heme/Allergies: Negative.   Psychiatric/Behavioral: Negative.      Physical Exam:  weight is 221 lb (100.2 kg).   Wt Readings from Last 3 Encounters:  12/12/20 221 lb (100.2 kg)  11/23/20 221 lb (100.2 kg)  10/12/20 215 lb 12.8 oz (97.9 kg)    Physical Exam Vitals reviewed.  HENT:     Head: Normocephalic and atraumatic.  Eyes:     Pupils: Pupils are equal, round, and reactive to light.  Cardiovascular:     Rate and Rhythm: Normal rate and regular rhythm.     Heart sounds: Normal heart sounds.  Pulmonary:     Effort: Pulmonary effort  is normal.     Breath sounds: Normal breath sounds.  Abdominal:     General: Bowel sounds are normal.     Palpations: Abdomen is soft.  Musculoskeletal:        General: No tenderness or deformity. Normal range of motion.     Cervical back: Normal range of motion.     Comments: He does have some swelling in the fingers of his right hand.  The main finger is his second finger.  This is quite swollen.  He does have  some limited range of motion of his fingers.  It is hard for him to make a fist.  Lymphadenopathy:     Cervical: No cervical adenopathy.  Skin:    General: Skin is warm and dry.     Findings: No erythema or rash.  Neurological:     Mental Status: He is alert and oriented to person, place, and time.  Psychiatric:        Behavior: Behavior normal.        Thought Content: Thought content normal.        Judgment: Judgment normal.    Lab Results  Component Value Date   WBC 8.7 12/12/2020   HGB 11.9 (L) 12/12/2020   HCT 33.8 (L) 12/12/2020   MCV 93.1 12/12/2020   PLT 187 12/12/2020   Lab Results  Component Value Date   FERRITIN 307 12/12/2020   IRON 82 12/12/2020   TIBC 308 12/12/2020   UIBC 226 12/12/2020   IRONPCTSAT 27 12/12/2020   Lab Results  Component Value Date   RETICCTPCT 5.4 (H) 08/15/2020   RBC 3.63 (L) 12/12/2020   No results found for: KPAFRELGTCHN, LAMBDASER, KAPLAMBRATIO No results found for: IGGSERUM, IGA, IGMSERUM No results found for: Odetta Pink, SPEI   Chemistry      Component Value Date/Time   NA 139 12/12/2020 0824   NA 140 08/28/2019 0940   K 3.5 12/12/2020 0824   CL 105 12/12/2020 0824   CO2 26 12/12/2020 0824   BUN 26 (H) 12/12/2020 0824   BUN 21 08/28/2019 0940   CREATININE 1.22 12/12/2020 0824      Component Value Date/Time   CALCIUM 9.3 12/12/2020 0824   ALKPHOS 51 12/12/2020 0824   AST 14 (L) 12/12/2020 0824   ALT 12 12/12/2020 0824   BILITOT 0.9 12/12/2020 0824        Impression and Plan: John Douglas is a pleasant 65 yo caucasian gentleman with  history of CVA secondary to atrial fib and rectal bleeding. He was diagnosed with metastatic adenocarcinoma of the rectum-K-ras wild type/BRAF wild-type/HER-2 negative/MMR proficient. He had adenopathy distant to the rectum.   Has been on aggressive treatment to date.  He has had a full dose systemic chemotherapy and then chemotherapy with radiation therapy.   Currently, he is on maintenance therapy with Xeloda/Avastin.  I will set him up with a another PET scan so we see how everything looks.  His last scan I think was done back in April.  I think that the CEA level being normal is certainly a good indicator.  Hopefully, as we go further, we might be able to spread his treatments out a little bit more.  I will plan to see him back myself in another 3 weeks.    Volanda Napoleon, MD 8/15/20222:27 PM

## 2020-12-13 ENCOUNTER — Other Ambulatory Visit (HOSPITAL_BASED_OUTPATIENT_CLINIC_OR_DEPARTMENT_OTHER): Payer: Self-pay

## 2020-12-13 ENCOUNTER — Other Ambulatory Visit (HOSPITAL_COMMUNITY): Payer: Self-pay

## 2020-12-13 MED ORDER — CAPECITABINE 500 MG PO TABS
1000.0000 mg/m2 | ORAL_TABLET | Freq: Two times a day (BID) | ORAL | 6 refills | Status: DC
  Filled 2020-12-13: qty 112, 14d supply, fill #0
  Filled 2020-12-13: qty 112, 21d supply, fill #0
  Filled 2021-01-03: qty 112, 21d supply, fill #1
  Filled 2021-01-19: qty 112, 21d supply, fill #2
  Filled 2021-02-16: qty 112, 21d supply, fill #3
  Filled 2021-03-06: qty 112, 21d supply, fill #4
  Filled 2021-03-21: qty 112, 21d supply, fill #5

## 2020-12-13 NOTE — Addendum Note (Signed)
Addended by: Burney Gauze R on: 12/13/2020 08:11 AM   Modules accepted: Orders

## 2020-12-14 ENCOUNTER — Other Ambulatory Visit (HOSPITAL_COMMUNITY): Payer: Self-pay

## 2020-12-14 ENCOUNTER — Ambulatory Visit: Payer: Medicare HMO | Admitting: Internal Medicine

## 2020-12-14 ENCOUNTER — Encounter: Payer: Self-pay | Admitting: Internal Medicine

## 2020-12-14 ENCOUNTER — Other Ambulatory Visit: Payer: Self-pay

## 2020-12-14 VITALS — BP 124/80 | HR 57 | Ht 70.0 in | Wt 222.0 lb

## 2020-12-14 DIAGNOSIS — I2510 Atherosclerotic heart disease of native coronary artery without angina pectoris: Secondary | ICD-10-CM | POA: Diagnosis not present

## 2020-12-14 DIAGNOSIS — E1169 Type 2 diabetes mellitus with other specified complication: Secondary | ICD-10-CM

## 2020-12-14 DIAGNOSIS — C20 Malignant neoplasm of rectum: Secondary | ICD-10-CM

## 2020-12-14 DIAGNOSIS — I5032 Chronic diastolic (congestive) heart failure: Secondary | ICD-10-CM | POA: Diagnosis not present

## 2020-12-14 DIAGNOSIS — I4819 Other persistent atrial fibrillation: Secondary | ICD-10-CM

## 2020-12-14 DIAGNOSIS — E785 Hyperlipidemia, unspecified: Secondary | ICD-10-CM

## 2020-12-14 MED ORDER — FLUTICASONE PROPIONATE 50 MCG/ACT NA SUSP: 1.0000 | Freq: Two times a day (BID) | NASAL | 3 refills | Status: DC | PRN

## 2020-12-14 MED ORDER — NITROGLYCERIN 0.4 MG SL SUBL: 0.4000 mg | SUBLINGUAL_TABLET | SUBLINGUAL | 99 refills | Status: DC | PRN

## 2020-12-14 MED ORDER — METOPROLOL SUCCINATE ER 50 MG PO TB24: 50.0000 mg | ORAL_TABLET | Freq: Every day | ORAL | 3 refills | Status: DC

## 2020-12-14 NOTE — Patient Instructions (Signed)
Medication Instructions:   Decrease metoprolol succinate to 50 mg daily  Stop rosuvastatin (Crestor) for 1 month.  At that time, please call us to let us know if your hand/finger pain and swelling have improved.  Start Flonase 1 spray each nostril twice daily as needed for sinus congestion.  *If you need a refill on your cardiac medications before your next appointment, please call your pharmacy*   Lab Work: None  If you have labs (blood work) drawn today and your tests are completely normal, you will receive your results only by: [place] (if you have MyChart) OR A paper copy in the mail If you have any lab test that is abnormal or we need to change your treatment, we will call you to review the results.   Testing/Procedures: None   Follow-Up: At [facility], you and your health needs are our priority.  As part of our continuing mission to provide you with exceptional heart care, we have created designated Provider Care Teams.  These Care Teams include your primary Cardiologist (physician) and Advanced Practice Providers (APPs -  Physician Assistants and Nurse Practitioners) who all work together to provide you with the care you need, when you need it.  We recommend signing up for the patient portal called "MyChart".  Sign up information is provided on this After Visit Summary.  MyChart is used to connect with patients for Virtual Visits (Telemedicine).  Patients are able to view lab/test results, encounter notes, upcoming appointments, etc.  Non-urgent messages can be sent to your provider as well.   To learn more about what you can do with MyChart, go to [email or web address].    Your next appointment:   6 month(s)  The format for your next appointment:   In Person  Provider:   You may see Nelva Bush, MD or one of the following Advanced Practice Providers on your designated Care Team:   Murray Hodgkins, NP Christell Faith, PA-C Marrianne Mood, PA-C [place]  [place], [place]

## 2020-12-14 NOTE — Progress Notes (Signed)
Follow-up Outpatient Visit Date: 12/14/2020  Primary Care Provider: Leonides Sake, MD [place] [id number]  Chief Complaint: Follow-up CAD, HFpEF, and atrial fibrillation  HPI:  Mr. Mcaulay is a 65 y.o. male with history of CAD with inferior STEMI and primary PCI to the RCA in [id number] complicated by sustained ventricular tachycardia in the setting of residual left coronary artery disease status post subsequent PCI to the mid LAD (06/2019), hypertension, hyperlipidemia, paroxysmal atrial fibrillation complicated by stroke in 09/2019, ischemic cardiomyopathy, type 2 diabetes mellitus, and recurrent GI bleeding with subsequent diagnosis of rectal cancer s/p neoadjuvant chemotherapy and XRT, who presents for follow-up of coronary artery disease and atrial fibrillation.  I last saw Mr. Christodoulou in April, at which time he was doing well.  He continues to feel relatively well, denying chest pain, shortness of breath, palpitations, and lightheadedness.  He notes a little bit of fatigue at times, noting that he does not sleep well some nights.  He mentions occasional sinus congestion and requests a refill for Flonase which has helped him in the past.  Over the last few weeks, he has noticed some stiffness, swelling, and tenderness in his right hand, predominantly affecting the middle finger.  At first he thought he may have overused his hand while sewing seed on his property.  He now wonders if it could be related to his medications, particularly rosuvastatin, as he has experienced myalgias in the past with statin therapy.  Mr. Rivenburgh has not had any bleeding, remaining on apixaban.  He feels like his gait is somewhat unsteady, making it challenging for him to walk extended distances without stopping or holding onto something.  This is not new.  He has not fallen or passed out.  --------------------------------------------------------------------------------------------------  Past Medical  History:  Diagnosis Date   Allergic rhinitis    [facility]   Anxiety    Benign essential hypertension    [facility]   Coronary artery disease    [facility]   Diabetic neuropathy, type II diabetes mellitus ([place])    [facility]   Dysrhythmia    Iron deficiency anemia due to chronic blood loss 11/18/2019   Mixed hyperlipidemia    [facility]   Myocardial infarct [facility])    [place]   Obesity, unspecified    [facility]   Persistent atrial fibrillation [facility])    Rectal bleeding    [facility]   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 ([place]) 11/17/2019   Stroke ([place])    Wide-complex tachycardia ([place]) 06/04/2019   Past Surgical History:  Procedure Laterality Date   BIOPSY  10/07/2019   Procedure: BIOPSY;  Surgeon: Lavena Bullion, DO;  Location: [facility];  Service: Gastroenterology;;   COLONOSCOPY WITH PROPOFOL N/A 10/07/2019   Procedure: COLONOSCOPY WITH PROPOFOL;  Surgeon: Lavena Bullion, DO;  Location: [facility];  Service: Gastroenterology;  Laterality: N/A;   CORONARY ANGIOPLASTY     3 stents   IR IMAGING GUIDED PORT INSERTION  11/24/2019   LEFT HEART CATH AND CORONARY ANGIOGRAPHY N/A 06/05/2019   Procedure: LEFT HEART CATH AND CORONARY ANGIOGRAPHY;  Surgeon: Nelva Bush, MD;  Location: [place] [facility];  Service: Cardiovascular;  Laterality: N/A;   POLYPECTOMY  10/07/2019   Procedure: POLYPECTOMY;  Surgeon: Lavena Bullion, DO;  Location: [facility];  Service: Gastroenterology;;   SUBMUCOSAL TATTOO INJECTION  10/07/2019  Procedure: SUBMUCOSAL TATTOO INJECTION;  Surgeon: Lavena Bullion, DO;  Location: [facility];  Service: Gastroenterology;;     Current Meds  Medication Sig   capecitabine (XELODA) 500 MG tablet Take 4 tablets (2,000 mg total) by mouth 2 (two) times daily after a  meal. Take for 14 days, then hold for 7 days. Repeat every 21 days.   diltiazem (CARDIZEM CD) 180 MG 24 hr capsule Take 1 capsule (180 mg total) by mouth daily.   ELIQUIS 5 MG TABS tablet TAKE 1 TABLET BY MOUTH TWICE A DAY   ezetimibe (ZETIA) 10 MG tablet Take 1 tablet by mouth once daily   furosemide (LASIX) 40 MG tablet TAKE 1 TABLET BY MOUTH EVERY DAY   gabapentin (NEURONTIN) 100 MG capsule TAKE 1 CAPSULE BY MOUTH AT BEDTIME.   glimepiride (AMARYL) 2 MG tablet Take 2 mg by mouth daily.   glucose blood test strip Monitor BS before meals and at bedtime for a couple of weeks. Then may decrease to bid before meals   KLOR-CON M20 20 MEQ tablet TAKE 1 TABLET BY MOUTH EVERY DAY   Lancets (ONETOUCH ULTRASOFT) lancets Use as instructed   lidocaine-prilocaine (EMLA) cream Apply to affected area once   metoprolol succinate (TOPROL-XL) 100 MG 24 hr tablet Take 1 tablet (100 mg total) by mouth daily. Take with or immediately following a meal.   nitroGLYCERIN (NITROSTAT) 0.4 MG SL tablet Place 0.4 mg under the tongue every 5 (five) minutes as needed for chest pain.    rosuvastatin (CRESTOR) 20 MG tablet TAKE 1 TABLET BY MOUTH EVERY DAY   sertraline (ZOLOFT) 100 MG tablet Take 100 mg by mouth daily.    Allergies: Celexa [citalopram], Ibuprofen, Metformin and related, Naproxen, Yellow jacket venom [bee venom], and Penicillins  Social History   Tobacco Use   Smoking status: Never   Smokeless tobacco: Former    Types: Chew    Quit date: 05/04/2019  Vaping Use   Vaping Use: Never used  Substance Use Topics   Alcohol use: Not Currently    Comment: occasional   Drug use: Yes    Types: Marijuana    Comment: occassionally    Family History  Problem Relation Age of Onset   Diverticulitis Mother    Hypertension Father    Heart disease Father        MI   Prostate cancer Father    Colon cancer Neg Hx    Stomach cancer Neg Hx    Pancreatic cancer Neg Hx    Rectal cancer Neg Hx     Review of  Systems: A 12-system review of systems was performed and was negative except as noted in the HPI.  --------------------------------------------------------------------------------------------------  Physical Exam: BP 124/80 (BP Location: Left Arm, Patient Position: Sitting, Cuff Size: Large)   Pulse (!) 57   Ht '5\' 10"'$  (1.778 m)   Wt 222 lb (100.7 kg)   SpO2 97%   BMI 31.85 kg/m   General:  NAD. Neck: No JVD or HJR. Lungs: Clear to auscultation bilaterally without wheezes or crackles. Heart: Bradycardic but regular without murmurs, rubs, or gallops. Abdomen: Soft, nontender, nondistended. Extremities: No lower extremity edema.  Right hand is slightly swollen, most pronounced in the middle finger.  No erythema or focal tenderness appreciated.  EKG: Baseline artifact noted.  Sinus bradycardia with lateral T wave inversions.  No significant change from prior tracing on 08/17/2020.  Lab Results  Component Value Date   WBC 8.7 12/12/2020   HGB  11.9 (L) 12/12/2020   HCT 33.8 (L) 12/12/2020   MCV 93.1 12/12/2020   PLT 187 12/12/2020    Lab Results  Component Value Date   NA 139 12/12/2020   K 3.5 12/12/2020   CL 105 12/12/2020   CO2 26 12/12/2020   BUN 26 (H) 12/12/2020   CREATININE 1.22 12/12/2020   GLUCOSE 199 (H) 12/12/2020   ALT 12 12/12/2020    Lab Results  Component Value Date   CHOL 148 10/09/2019   HDL 26 (L) 10/09/2019   LDLCALC 98 10/09/2019   TRIG 119 10/09/2019   CHOLHDL 5.7 10/09/2019    --------------------------------------------------------------------------------------------------  ASSESSMENT AND PLAN: Coronary artery disease: Mr. Lydecker continues to do well without angina.  Given his hand swelling and pain, we have agreed to a 1 month statin holiday, though this would seem to be an unusual reaction to rosuvastatin.  If he does not have any improvement after a month, we will plan to restart rosuvastatin and consider rheumatologic evaluation.  Defer  reinitiation of aspirin given long-term anticoagulation with apixaban and history of severe GI bleeding in the setting of rectal cancer.  Chronic HFpEF: Mr. Buffone appears euvolemic with NYHA class II symptoms.  Continue furosemide 40 mg daily.  Persistent atrial fibrillation: Mr. Burckhard is maintaining sinus rhythm again today.  He notes some occasional fatigue and balance issues when walking in the setting of sinus bradycardia today.  We have agreed to halve metoprolol succinate to 50 mg daily.  Continue diltiazem 180 mg daily as well as indefinite anticoagulation with apixaban 5 mg twice daily, as long as significant bleeding does not occur.  I have renewed his handicap placard given difficulty ambulating more than 200 yards.  Hyperlipidemia associated with type 2 diabetes mellitus: Most recent lipid panel in 05/2020 through Dr. Lisbeth Ply showed excellent LDL of 47 and triglycerides of 111.  Given history of statin associated myalgias and recent right hand pain/swelling, we have agreed to hold rosuvastatin for 1 month to see if his symptoms improve.  If not, we will plan to restart rosuvastatin and consider rheumatologic evaluation.  Sinus congestion: Intermittent and responsive to Flonase.  We will refill Flonase today.  Rectal cancer: No bleeding reported.  Continue ongoing follow-up/management with Dr. Marin Olp.  Follow-up: Return to clinic in 6 months.  Nelva Bush, MD 12/14/2020 4:52 PM

## 2020-12-16 ENCOUNTER — Encounter: Payer: Self-pay | Admitting: Internal Medicine

## 2020-12-19 ENCOUNTER — Encounter: Payer: Self-pay | Admitting: Hematology & Oncology

## 2020-12-23 ENCOUNTER — Other Ambulatory Visit: Payer: Self-pay

## 2020-12-23 ENCOUNTER — Ambulatory Visit (HOSPITAL_COMMUNITY)
Admission: RE | Admit: 2020-12-23 | Discharge: 2020-12-23 | Disposition: A | Discharge status: Home / Self Care | Payer: Medicare HMO | Source: Ambulatory Visit | Attending: Hematology & Oncology | Admitting: Hematology & Oncology

## 2020-12-23 ENCOUNTER — Telehealth: Payer: Self-pay | Admitting: *Deleted

## 2020-12-23 DIAGNOSIS — Z79899 Other long term (current) drug therapy: Secondary | ICD-10-CM | POA: Insufficient documentation

## 2020-12-23 DIAGNOSIS — C20 Malignant neoplasm of rectum: Secondary | ICD-10-CM | POA: Diagnosis present

## 2020-12-23 LAB — GLUCOSE, CAPILLARY: Glucose-Capillary: 128 mg/dL — ABNORMAL HIGH (ref 70–99)

## 2020-12-23 NOTE — Telephone Encounter (Signed)
Call received from patient's daughter [name] to inform Dr. Marin Olp that pt has been staying awake all night and sleeping all day.  She states that pt.'s PCP has increased Zoloft dose to 100 mg, but that pt still remains aggitated and irate at times.  Krystle instructed to contact pt.'s PCP for further direction regarding Zoloft.  Dr. Marin Olp notified of above.  No orders received at this time.

## 2020-12-26 ENCOUNTER — Telehealth: Payer: Self-pay

## 2020-12-26 NOTE — Telephone Encounter (Signed)
-----   Message from Volanda Napoleon, MD sent at 12/26/2020 10:23 AM EDT ----- Call - there is NO evidence of any metastatic disease!!!  Laurey Arrow

## 2020-12-26 NOTE — Telephone Encounter (Signed)
Called and informed patient of lab results, patient verbalized understanding and denies any questions or concerns at this time.   

## 2021-01-01 ENCOUNTER — Other Ambulatory Visit: Payer: Self-pay | Admitting: Internal Medicine

## 2021-01-03 ENCOUNTER — Encounter: Payer: Self-pay | Admitting: Hematology & Oncology

## 2021-01-03 ENCOUNTER — Other Ambulatory Visit (HOSPITAL_COMMUNITY): Payer: Self-pay

## 2021-01-09 ENCOUNTER — Other Ambulatory Visit: Payer: Self-pay

## 2021-01-09 MED ORDER — POTASSIUM CHLORIDE CRYS ER 20 MEQ PO TBCR: 20.0000 meq | EXTENDED_RELEASE_TABLET | Freq: Every day | ORAL | 2 refills | Status: DC

## 2021-01-16 ENCOUNTER — Inpatient Hospital Stay: Payer: Medicare HMO | Attending: Hematology & Oncology

## 2021-01-16 ENCOUNTER — Inpatient Hospital Stay: Payer: Medicare HMO

## 2021-01-16 ENCOUNTER — Other Ambulatory Visit: Payer: Self-pay | Admitting: *Deleted

## 2021-01-16 ENCOUNTER — Inpatient Hospital Stay: Payer: Medicare HMO | Admitting: Hematology & Oncology

## 2021-01-16 ENCOUNTER — Encounter: Payer: Self-pay | Admitting: Hematology & Oncology

## 2021-01-16 ENCOUNTER — Other Ambulatory Visit: Payer: Self-pay

## 2021-01-16 ENCOUNTER — Telehealth: Payer: Self-pay

## 2021-01-16 VITALS — BP 117/60 | HR 64 | Temp 98.6°F | Resp 17 | Ht 70.0 in | Wt 224.0 lb

## 2021-01-16 DIAGNOSIS — I4891 Unspecified atrial fibrillation: Secondary | ICD-10-CM | POA: Diagnosis not present

## 2021-01-16 DIAGNOSIS — D500 Iron deficiency anemia secondary to blood loss (chronic): Secondary | ICD-10-CM

## 2021-01-16 DIAGNOSIS — K625 Hemorrhage of anus and rectum: Secondary | ICD-10-CM | POA: Diagnosis not present

## 2021-01-16 DIAGNOSIS — Z886 Allergy status to analgesic agent status: Secondary | ICD-10-CM | POA: Diagnosis not present

## 2021-01-16 DIAGNOSIS — C779 Secondary and unspecified malignant neoplasm of lymph node, unspecified: Secondary | ICD-10-CM | POA: Insufficient documentation

## 2021-01-16 DIAGNOSIS — Z91030 Bee allergy status: Secondary | ICD-10-CM | POA: Insufficient documentation

## 2021-01-16 DIAGNOSIS — M1990 Unspecified osteoarthritis, unspecified site: Secondary | ICD-10-CM | POA: Diagnosis not present

## 2021-01-16 DIAGNOSIS — K649 Unspecified hemorrhoids: Secondary | ICD-10-CM | POA: Diagnosis not present

## 2021-01-16 DIAGNOSIS — Z7982 Long term (current) use of aspirin: Secondary | ICD-10-CM | POA: Diagnosis not present

## 2021-01-16 DIAGNOSIS — Z8673 Personal history of transient ischemic attack (TIA), and cerebral infarction without residual deficits: Secondary | ICD-10-CM | POA: Insufficient documentation

## 2021-01-16 DIAGNOSIS — Z880 Allergy status to penicillin: Secondary | ICD-10-CM | POA: Diagnosis not present

## 2021-01-16 DIAGNOSIS — Z7901 Long term (current) use of anticoagulants: Secondary | ICD-10-CM | POA: Diagnosis not present

## 2021-01-16 DIAGNOSIS — Z95828 Presence of other vascular implants and grafts: Secondary | ICD-10-CM

## 2021-01-16 DIAGNOSIS — C20 Malignant neoplasm of rectum: Secondary | ICD-10-CM

## 2021-01-16 DIAGNOSIS — M2550 Pain in unspecified joint: Secondary | ICD-10-CM | POA: Diagnosis not present

## 2021-01-16 LAB — CBC WITH DIFFERENTIAL (CANCER CENTER ONLY)
Abs Immature Granulocytes: 0.04 10*3/uL (ref 0.00–0.07)
Basophils Absolute: 0 10*3/uL (ref 0.0–0.1)
Basophils Relative: 1 %
Eosinophils Absolute: 0.1 10*3/uL (ref 0.0–0.5)
Eosinophils Relative: 2 %
HCT: 34.1 % — ABNORMAL LOW (ref 39.0–52.0)
Hemoglobin: 12.5 g/dL — ABNORMAL LOW (ref 13.0–17.0)
Immature Granulocytes: 1 %
Lymphocytes Relative: 21 %
Lymphs Abs: 1.3 10*3/uL (ref 0.7–4.0)
MCH: 35.6 pg — ABNORMAL HIGH (ref 26.0–34.0)
MCHC: 36.7 g/dL — ABNORMAL HIGH (ref 30.0–36.0)
MCV: 97.2 fL (ref 80.0–100.0)
Monocytes Absolute: 0.5 10*3/uL (ref 0.1–1.0)
Monocytes Relative: 7 %
Neutro Abs: 4.3 10*3/uL (ref 1.7–7.7)
Neutrophils Relative %: 68 %
Platelet Count: 151 10*3/uL (ref 150–400)
RBC: 3.51 MIL/uL — ABNORMAL LOW (ref 4.22–5.81)
RDW: 21.1 % — ABNORMAL HIGH (ref 11.5–15.5)
WBC Count: 6.3 10*3/uL (ref 4.0–10.5)
nRBC: 0 % (ref 0.0–0.2)

## 2021-01-16 LAB — CMP (CANCER CENTER ONLY)
ALT: 17 U/L (ref 0–44)
AST: 22 U/L (ref 15–41)
Albumin: 3.7 g/dL (ref 3.5–5.0)
Alkaline Phosphatase: 60 U/L (ref 38–126)
Anion gap: 15 (ref 5–15)
BUN: 26 mg/dL — ABNORMAL HIGH (ref 8–23)
CO2: 23 mmol/L (ref 22–32)
Calcium: 9.6 mg/dL (ref 8.9–10.3)
Chloride: 103 mmol/L (ref 98–111)
Creatinine: 1.31 mg/dL — ABNORMAL HIGH (ref 0.61–1.24)
GFR, Estimated: >60 mL/min (ref >60)
Glucose, Bld: 158 mg/dL — ABNORMAL HIGH (ref 70–99)
Potassium: 3.7 mmol/L (ref 3.5–5.1)
Sodium: 141 mmol/L (ref 135–145)
Total Bilirubin: 1.4 mg/dL — ABNORMAL HIGH (ref 0.3–1.2)
Total Protein: 6.2 g/dL — ABNORMAL LOW (ref 6.5–8.1)

## 2021-01-16 MED ORDER — HYDROCORTISONE ACE-PRAMOXINE 1-1 % EX CREA: 1.0000 "application " | TOPICAL_CREAM | Freq: Two times a day (BID) | CUTANEOUS | 1 refills | Status: DC

## 2021-01-16 MED ORDER — HEPARIN SOD (PORK) LOCK FLUSH 100 UNIT/ML IV SOLN
500.0000 [IU] | Freq: Once | INTRAVENOUS | Status: AC
  Administered 2021-01-16: 500 [IU] via INTRAVENOUS

## 2021-01-16 MED ORDER — SODIUM CHLORIDE 0.9% FLUSH
10.0000 mL | Freq: Once | INTRAVENOUS | Status: AC
  Administered 2021-01-16: 10 mL via INTRAVENOUS

## 2021-01-16 NOTE — Telephone Encounter (Signed)
Appts made per 01/16/21 lois, pt to gain updated sch at [employer]

## 2021-01-16 NOTE — Patient Instructions (Signed)
Implanted Port Home Guide An implanted port is a device that is placed under the skin. It is usually placed in the chest. The device can be used to give IV medicine, to take blood, or for dialysis. You may have an implanted port if: You need IV medicine that would be irritating to the small veins in your hands or arms. You need IV medicines, such as antibiotics, for a long period of time. You need IV nutrition for a long period of time. You need dialysis. When you have a port, your health care provider can choose to use the port instead of veins in your arms for these procedures. You may have fewer limitations when using a port than you would if you used other types of long-term IVs, and you will likely be able to return to normal activities after your incision heals. An implanted port has two main parts: Reservoir. The reservoir is the part where a needle is inserted to give medicines or draw blood. The reservoir is round. After it is placed, it appears as a small, raised area under your skin. Catheter. The catheter is a thin, flexible tube that connects the reservoir to a vein. Medicine that is inserted into the reservoir goes into the catheter and then into the vein. How is my port accessed? To access your port: A numbing cream may be placed on the skin over the port site. Your health care provider will put on a mask and sterile gloves. The skin over your port will be cleaned carefully with a germ-killing soap and allowed to dry. Your health care provider will gently pinch the port and insert a needle into it. Your health care provider will check for a blood return to make sure the port is in the vein and is not clogged. If your port needs to remain accessed to get medicine continuously (constant infusion), your health care provider will place a clear bandage (dressing) over the needle site. The dressing and needle will need to be changed every week, or as told by your health care provider. What  is flushing? Flushing helps keep the port from getting clogged. Follow instructions from your health care provider about how and when to flush the port. Ports are usually flushed with saline solution or a medicine called heparin. The need for flushing will depend on how the port is used: If the port is only used from time to time to give medicines or draw blood, the port may need to be flushed: Before and after medicines have been given. Before and after blood has been drawn. As part of routine maintenance. Flushing may be recommended every 4-6 weeks. If a constant infusion is running, the port may not need to be flushed. Throw away any syringes in a disposal container that is meant for sharp items (sharps container). You can buy a sharps container from a pharmacy, or you can make one by using an empty hard plastic bottle with a cover. How long will my port stay implanted? The port can stay in for as long as your health care provider thinks it is needed. When it is time for the port to come out, a surgery will be done to remove it. The surgery will be similar to the procedure that was done to put the port in. Follow these instructions at home:  Flush your port as told by your health care provider. If you need an infusion over several days, follow instructions from your health care provider about how   to take care of your port site. Make sure you: Wash your hands with soap and water before you change your dressing. If soap and water are not available, use alcohol-based hand sanitizer. Change your dressing as told by your health care provider. Place any used dressings or infusion bags into a plastic bag. Throw that bag in the trash. Keep the dressing that covers the needle clean and dry. Do not get it wet. Do not use scissors or sharp objects near the tube. Keep the tube clamped, unless it is being used. Check your port site every day for signs of infection. Check for: Redness, swelling, or  pain. Fluid or blood. Pus or a bad smell. Protect the skin around the port site. Avoid wearing bra straps that rub or irritate the site. Protect the skin around your port from seat belts. Place a soft pad over your chest if needed. Bathe or shower as told by your health care provider. The site may get wet as long as you are not actively receiving an infusion. Return to your normal activities as told by your health care provider. Ask your health care provider what activities are safe for you. Carry a medical alert card or wear a medical alert bracelet at all times. This will let health care providers know that you have an implanted port in case of an emergency. Get help right away if: You have redness, swelling, or pain at the port site. You have fluid or blood coming from your port site. You have pus or a bad smell coming from the port site. You have a fever. Summary Implanted ports are usually placed in the chest for long-term IV access. Follow instructions from your health care provider about flushing the port and changing bandages (dressings). Take care of the area around your port by avoiding clothing that puts pressure on the area, and by watching for signs of infection. Protect the skin around your port from seat belts. Place a soft pad over your chest if needed. Get help right away if you have a fever or you have redness, swelling, pain, drainage, or a bad smell at the port site. This information is not intended to replace advice given to you by your health care provider. Make sure you discuss any questions you have with your health care provider. Document Revised: 07/06/2020 Document Reviewed: 08/31/2019 Elsevier Patient Education  2022 [employer]  

## 2021-01-16 NOTE — Addendum Note (Signed)
Addended by: Burney Gauze R on: 01/16/2021 12:48 PM   Modules accepted: Orders

## 2021-01-16 NOTE — Addendum Note (Signed)
Addended by: Amelia Jo I on: 01/16/2021 09:50 AM   Modules accepted: Orders

## 2021-01-16 NOTE — Progress Notes (Signed)
Hematology and Oncology Follow Up Visit  John Douglas [id number] 07/03/1955 65 y.o. 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 twice daily Aspirin 81 mg p.o. daily Zirabev (biosimilar Avastin)/Xeloda maintenance - started 10/12/2020   Interim History:  John Douglas is here today for follow-up and treatment.  He continues to do amazingly well with respect to response to therapy.  We did do a PET scan on him.  This was done on 12/23/2020.  The PET scan did not show any evidence of any metastatic disease.  He had mild residual wall thickening in the upper rectum.  This had an SUV of 5.7.  I really think that we should try to consider surgical resection at this point.  I think he has a special category of metastatic disease in which he might be cured with surgery.  Hopefully, this will not require a colostomy.  We does require permanent colostomy, I know that he would go through with it.  He is mostly bothered by hemorrhoids.  He has bad hemorrhoids.  I took a look.  He has 1 hemorrhoid that is enlarged.  We can call in some hemorrhoidal cream to see if this may help a little bit.  He does have arthritis.  I am unsure if the arthritis is secondary to his treatments.  This typically is not seen with the Xeloda or Avastin.  John Douglas is still in good health.  I would like to try to be aggressive if we can.  He is on Eliquis because of atrial fibrillation.  I do not hear any atrial fibrillation today.  He has had no problems with cough.  He has had no nausea or vomiting.  He is eating well.  There is no leg swelling.  Overall, I think his  performance status is ECOG 1.    Medications:  Allergies as of 01/16/2021       Reactions   Celexa [citalopram] Other (See Comments)   Contraindicated with A-Fib   Ibuprofen Other (See Comments)   Was told to not take this    Metformin And Related Other (See Comments)   Bloody stools    Naproxen Other (See Comments)   Was told to not take this    Yellow Jacket Venom [bee Venom] Swelling, Other (See Comments)   Severe swelling where stung   Penicillins Hives   Did it involve swelling of the face/tongue/throat, SOB, or low BP? Unk Did it involve sudden or severe rash/hives, skin peeling, or any reaction on the inside of your mouth or nose? Yes Did you need to seek medical attention at a hospital or doctor's office? Unk When did it last happen? "Childhood- 55 years ago" If all above answers are "NO", may proceed with cephalosporin use.        Medication List        Accurate as of January 16, 2021  9:22 AM. If you have any questions, ask your nurse or doctor.          STOP taking these medications    rosuvastatin 20 MG tablet Commonly known as: CRESTOR Stopped by: Josph Macho, MD       TAKE these medications  capecitabine 500 MG tablet Commonly known as: Xeloda Take 4 tablets (2,000 mg total) by mouth 2 (two) times daily after a meal. Take for 14 days, then hold for 7 days. Repeat every 21 days.   diltiazem 180 MG 24 hr capsule Commonly known as: CARDIZEM CD Take 1 capsule (180 mg total) by mouth daily.   Eliquis 5 MG Tabs tablet Generic drug: apixaban TAKE 1 TABLET BY MOUTH TWICE A DAY   ezetimibe 10 MG tablet Commonly known as: ZETIA Take 1 tablet by mouth once daily   FLUoxetine 10 MG capsule Commonly known as: PROZAC Take by mouth daily.   fluticasone 50 MCG/ACT nasal spray Commonly known as: Flonase Place 1 spray into both nostrils 2 (two) times daily as needed (sinus congestion).   furosemide 40 MG tablet Commonly known as: LASIX TAKE  1 TABLET BY MOUTH EVERY DAY   gabapentin 100 MG capsule Commonly known as: NEURONTIN TAKE 1 CAPSULE BY MOUTH AT BEDTIME.   glimepiride 2 MG tablet Commonly known as: AMARYL Take 2 mg by mouth daily.   glucose blood test strip Monitor BS before meals and at bedtime for a couple of weeks. Then may decrease to bid before meals   lidocaine-prilocaine cream Commonly known as: EMLA Apply to affected area once   metoprolol succinate 50 MG 24 hr tablet Commonly known as: TOPROL-XL Take 1 tablet (50 mg total) by mouth daily. Take with or immediately following a meal.   nitroGLYCERIN 0.4 MG SL tablet Commonly known as: NITROSTAT Place 1 tablet (0.4 mg total) under the tongue every 5 (five) minutes as needed for chest pain.   onetouch ultrasoft lancets Use as instructed   potassium chloride SA 20 MEQ tablet Commonly known as: Klor-Con M20 Take 1 tablet (20 mEq total) by mouth daily.   sertraline 100 MG tablet Commonly known as: ZOLOFT Take 100 mg by mouth daily.        Allergies:  Allergies  Allergen Reactions   Celexa [Citalopram] Other (See Comments)    Contraindicated with A-Fib   Ibuprofen Other (See Comments)    Was told to not take this    Metformin And Related Other (See Comments)    Bloody stools    Naproxen Other (See Comments)    Was told to not take this    Yellow Jacket Venom [Bee Venom] Swelling and Other (See Comments)    Severe swelling where stung   Penicillins Hives    Did it involve swelling of the face/tongue/throat, SOB, or low BP? Unk Did it involve sudden or severe rash/hives, skin peeling, or any reaction on the inside of your mouth or nose? Yes Did you need to seek medical attention at a hospital or doctor's office? Unk When did it last happen? "Childhood- 53 years ago" If all above answers are "NO", may proceed with cephalosporin use.     Past Medical History, Surgical history, Social history, and Family History were reviewed and  updated.  Review of Systems: . Review of Systems  Constitutional: Negative.   HENT: Negative.    Eyes: Negative.   Respiratory: Negative.    Cardiovascular: Negative.   Gastrointestinal: Negative.   Genitourinary: Negative.   Musculoskeletal:  Positive for joint pain.  Skin: Negative.   Neurological: Negative.   Endo/Heme/Allergies: Negative.   Psychiatric/Behavioral: Negative.      Physical Exam:  vitals were not taken for this visit.   Wt Readings from Last 3 Encounters:  01/16/21 224 lb (101.6 kg)  12/14/20  222 lb (100.7 kg)  12/12/20 221 lb (100.2 kg)    Physical Exam Vitals reviewed.  HENT:     Head: Normocephalic and atraumatic.  Eyes:     Pupils: Pupils are equal, round, and reactive to light.  Cardiovascular:     Rate and Rhythm: Normal rate and regular rhythm.     Heart sounds: Normal heart sounds.  Pulmonary:     Effort: Pulmonary effort is normal.     Breath sounds: Normal breath sounds.  Abdominal:     General: Bowel sounds are normal.     Palpations: Abdomen is soft.  Musculoskeletal:        General: No tenderness or deformity. Normal range of motion.     Cervical back: Normal range of motion.     Comments: He does have some swelling in the fingers of his right hand.  The main finger is his second finger.  This is quite swollen.  He does have some limited range of motion of his fingers.  It is hard for him to make a fist.  Lymphadenopathy:     Cervical: No cervical adenopathy.  Skin:    General: Skin is warm and dry.     Findings: No erythema or rash.  Neurological:     Mental Status: He is alert and oriented to person, place, and time.  Psychiatric:        Behavior: Behavior normal.        Thought Content: Thought content normal.        Judgment: Judgment normal.    Lab Results  Component Value Date   WBC 6.3 01/16/2021   HGB 12.5 (L) 01/16/2021   HCT 34.1 (L) 01/16/2021   MCV 97.2 01/16/2021   PLT 151 01/16/2021   Lab Results   Component Value Date   FERRITIN 307 12/12/2020   IRON 82 12/12/2020   TIBC 308 12/12/2020   UIBC 226 12/12/2020   IRONPCTSAT 27 12/12/2020   Lab Results  Component Value Date   RETICCTPCT 5.4 (H) 08/15/2020   RBC 3.51 (L) 01/16/2021   No results found for: KPAFRELGTCHN, LAMBDASER, KAPLAMBRATIO No results found for: IGGSERUM, IGA, IGMSERUM No results found for: Odetta Pink, SPEI   Chemistry      Component Value Date/Time   NA 139 12/12/2020 0824   NA 140 08/28/2019 0940   K 3.5 12/12/2020 0824   CL 105 12/12/2020 0824   CO2 26 12/12/2020 0824   BUN 26 (H) 12/12/2020 0824   BUN 21 08/28/2019 0940   CREATININE 1.22 12/12/2020 0824      Component Value Date/Time   CALCIUM 9.3 12/12/2020 0824   ALKPHOS 51 12/12/2020 0824   AST 14 (L) 12/12/2020 0824   ALT 12 12/12/2020 0824   BILITOT 0.9 12/12/2020 0824       Impression and Plan: Mr. Hackman is a pleasant 65 yo caucasian gentleman with  history of CVA secondary to atrial fib and rectal bleeding. He was diagnosed with metastatic adenocarcinoma of the rectum-K-ras wild type/BRAF wild-type/HER-2 negative/MMR proficient. He had adenopathy distant to the rectum.   Has been on aggressive treatment to date.  He has had a full dose systemic chemotherapy and then chemotherapy with radiation therapy.   Currently, he is on maintenance therapy with Xeloda/Avastin.  I will speak with one of our colorectal surgeons to see what they would think about the possibility of resecting out the primary.  Again, I realize this is a "  gray area."  However, I think since Mr. Goldner is done so well, I think we should see if surgical resection cannot provide a long-term remission.  I am going to hold his Avastin today.  If there is a role for surgery, I want to make sure that there is no Avastin in his system that would prevent tissue healing.  I will plan to get him back in a month.  I realize  this is a complex problem.  He has done so well from the beginning.  He is only had nodal metastasis.    Volanda Napoleon, MD 9/19/20229:22 AM

## 2021-01-17 LAB — CEA: CEA: 1.8 ng/mL (ref 0.0–4.7)

## 2021-01-19 ENCOUNTER — Other Ambulatory Visit (HOSPITAL_COMMUNITY): Payer: Self-pay

## 2021-01-23 ENCOUNTER — Other Ambulatory Visit (HOSPITAL_COMMUNITY): Payer: Self-pay

## 2021-02-08 ENCOUNTER — Other Ambulatory Visit (HOSPITAL_COMMUNITY): Payer: Self-pay

## 2021-02-10 ENCOUNTER — Other Ambulatory Visit (HOSPITAL_COMMUNITY): Payer: Self-pay

## 2021-02-14 ENCOUNTER — Inpatient Hospital Stay: Payer: Medicare HMO | Attending: Hematology & Oncology

## 2021-02-14 ENCOUNTER — Inpatient Hospital Stay: Payer: Medicare HMO

## 2021-02-14 ENCOUNTER — Encounter: Payer: Self-pay | Admitting: Hematology & Oncology

## 2021-02-14 ENCOUNTER — Other Ambulatory Visit: Payer: Self-pay

## 2021-02-14 ENCOUNTER — Inpatient Hospital Stay: Payer: Medicare HMO | Admitting: Hematology & Oncology

## 2021-02-14 VITALS — BP 128/86 | HR 70 | Temp 98.4°F | Resp 18 | Wt 223.5 lb

## 2021-02-14 DIAGNOSIS — C20 Malignant neoplasm of rectum: Secondary | ICD-10-CM | POA: Insufficient documentation

## 2021-02-14 DIAGNOSIS — Z7901 Long term (current) use of anticoagulants: Secondary | ICD-10-CM | POA: Diagnosis not present

## 2021-02-14 DIAGNOSIS — D500 Iron deficiency anemia secondary to blood loss (chronic): Secondary | ICD-10-CM

## 2021-02-14 DIAGNOSIS — Z95828 Presence of other vascular implants and grafts: Secondary | ICD-10-CM

## 2021-02-14 DIAGNOSIS — I4891 Unspecified atrial fibrillation: Secondary | ICD-10-CM | POA: Diagnosis not present

## 2021-02-14 DIAGNOSIS — Z8673 Personal history of transient ischemic attack (TIA), and cerebral infarction without residual deficits: Secondary | ICD-10-CM | POA: Insufficient documentation

## 2021-02-14 DIAGNOSIS — Z7982 Long term (current) use of aspirin: Secondary | ICD-10-CM | POA: Diagnosis not present

## 2021-02-14 DIAGNOSIS — Z452 Encounter for adjustment and management of vascular access device: Secondary | ICD-10-CM | POA: Diagnosis not present

## 2021-02-14 LAB — CBC WITH DIFFERENTIAL (CANCER CENTER ONLY)
Abs Immature Granulocytes: 0.09 10*3/uL — ABNORMAL HIGH (ref 0.00–0.07)
Basophils Absolute: 0 10*3/uL (ref 0.0–0.1)
Basophils Relative: 1 %
Eosinophils Absolute: 0.2 10*3/uL (ref 0.0–0.5)
Eosinophils Relative: 2 %
HCT: 32.7 % — ABNORMAL LOW (ref 39.0–52.0)
Hemoglobin: 12 g/dL — ABNORMAL LOW (ref 13.0–17.0)
Immature Granulocytes: 1 %
Lymphocytes Relative: 19 %
Lymphs Abs: 1.4 10*3/uL (ref 0.7–4.0)
MCH: 36.6 pg — ABNORMAL HIGH (ref 26.0–34.0)
MCHC: 36.7 g/dL — ABNORMAL HIGH (ref 30.0–36.0)
MCV: 99.7 fL (ref 80.0–100.0)
Monocytes Absolute: 0.6 10*3/uL (ref 0.1–1.0)
Monocytes Relative: 8 %
Neutro Abs: 5.1 10*3/uL (ref 1.7–7.7)
Neutrophils Relative %: 69 %
Platelet Count: 139 10*3/uL — ABNORMAL LOW (ref 150–400)
RBC: 3.28 MIL/uL — ABNORMAL LOW (ref 4.22–5.81)
RDW: 20.1 % — ABNORMAL HIGH (ref 11.5–15.5)
WBC Count: 7.3 10*3/uL (ref 4.0–10.5)
nRBC: 0 % (ref 0.0–0.2)

## 2021-02-14 LAB — IRON AND TIBC
Iron: 125 ug/dL (ref 42–163)
Saturation Ratios: 38 % (ref 20–55)
TIBC: 329 ug/dL (ref 202–409)
UIBC: 204 ug/dL (ref 117–376)

## 2021-02-14 LAB — FERRITIN: Ferritin: 374 ng/mL — ABNORMAL HIGH (ref 24–336)

## 2021-02-14 LAB — CMP (CANCER CENTER ONLY)
ALT: 15 U/L (ref 0–44)
AST: 16 U/L (ref 15–41)
Albumin: 4.1 g/dL (ref 3.5–5.0)
Alkaline Phosphatase: 61 U/L (ref 38–126)
Anion gap: 10 (ref 5–15)
BUN: 19 mg/dL (ref 8–23)
CO2: 26 mmol/L (ref 22–32)
Calcium: 9.3 mg/dL (ref 8.9–10.3)
Chloride: 103 mmol/L (ref 98–111)
Creatinine: 1.01 mg/dL (ref 0.61–1.24)
GFR, Estimated: >60 mL/min (ref >60)
Glucose, Bld: 151 mg/dL — ABNORMAL HIGH (ref 70–99)
Potassium: 3.2 mmol/L — ABNORMAL LOW (ref 3.5–5.1)
Sodium: 139 mmol/L (ref 135–145)
Total Bilirubin: 1.7 mg/dL — ABNORMAL HIGH (ref 0.3–1.2)
Total Protein: 6.6 g/dL (ref 6.5–8.1)

## 2021-02-14 LAB — LACTATE DEHYDROGENASE: LDH: 266 U/L — ABNORMAL HIGH (ref 98–192)

## 2021-02-14 LAB — CEA (IN HOUSE-CHCC): CEA (CHCC-In House): 1.45 ng/mL (ref 0.00–5.00)

## 2021-02-14 LAB — TOTAL PROTEIN, URINE DIPSTICK: Protein, ur: NEGATIVE mg/dL

## 2021-02-14 MED ORDER — SODIUM CHLORIDE 0.9% FLUSH
10.0000 mL | INTRAVENOUS | Status: AC | PRN
  Administered 2021-02-14: 10 mL via INTRAVENOUS

## 2021-02-14 MED ORDER — HEPARIN SOD (PORK) LOCK FLUSH 100 UNIT/ML IV SOLN
500.0000 [IU] | Freq: Once | INTRAVENOUS | Status: AC
  Administered 2021-02-14: 500 [IU] via INTRAVENOUS

## 2021-02-14 NOTE — Progress Notes (Signed)
Hematology and Oncology Follow Up Visit  John Douglas [id number] 1955-07-22 65 y.o. 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 twice daily Aspirin 81 mg p.o. daily Zirabev (biosimilar Avastin)/Xeloda maintenance - started 10/12/2020   Interim History:  John Douglas is here today for follow-up and treatment.  One of his daughters comes in with him.  There is obviously a discrepancy as to how he is doing.  He tells 1 story.  She tells another story.  I am sure that the truth is somewhere in between.  I am not sure what I can do with respect to his home status.  He apparently goes to bed late.  He wakes up late.  He does not do any exercise.  He has not changed his clothes in 4 days according to his daughter.  He does look a little bit disheveled today.  I will have to admit that.  He is not "disabled.".  I told him that he can do what he would like.  He can exercise.  He can go on vacation.  I will send a restriction that need to be placed on him.  I realize that he is on Eliquis.  However, I will think this would be that much of a hindrance for his activities.  We still need to get him to surgery.  I put a referral in earlier.  We will have to call them directly.  I think he would be a candidate for surgery.  His last PET scan looked wonderful.  His last CEA level was normal at 1.47.  He has not been on Avastin for the past month..  I have him off Avastin in anticipation of possible surgery.  He is doing okay on the Xeloda.  He has had no problems hands or feet.  He has had no blistering.  He has had no mouth sores.  Overall, I  would have to say that his performance status is probably ECOG 1.  He continues to do amazingly well with respect to response to therapy.  We did do a PET scan on him.  This was done on 12/23/2020.  The PET scan did not show any evidence of any metastatic disease.  He had mild residual wall thickening in the upper rectum.  This had an SUV of 5.7.  Medications:  Allergies as of 02/14/2021       Reactions   Celexa [citalopram] Other (See Comments)   Contraindicated with A-Fib   Ibuprofen Other (See Comments)   Was told to not take this    Metformin And Related Other (See Comments)   Bloody stools    Naproxen Other (See Comments)   Was told to not take this    Yellow Jacket Venom [bee Venom] Swelling, Other (See Comments)   Severe swelling where stung   Penicillins Hives   Did it involve swelling of the face/tongue/throat, SOB, or low BP? Unk Did it involve sudden or severe rash/hives, skin peeling, or any reaction on the inside of your mouth or nose? Yes Did you need to seek medical attention at a hospital or doctor's office? Unk When did it last happen? "Childhood- 55 years ago" If all above answers  are "NO", may proceed with cephalosporin use.        Medication List        Accurate as of February 14, 2021 10:06 AM. If you have any questions, ask your nurse or doctor.          capecitabine 500 MG tablet Commonly known as: Xeloda Take 4 tablets (2,000 mg total) by mouth 2 (two) times daily after a meal. Take for 14 days, then hold for 7 days. Repeat every 21 days.   diltiazem 180 MG 24 hr capsule Commonly known as: CARDIZEM CD Take 1 capsule (180 mg total) by mouth daily.   Eliquis 5 MG Tabs tablet Generic drug: apixaban TAKE 1 TABLET BY MOUTH TWICE A DAY   ezetimibe 10 MG tablet Commonly known as: ZETIA Take 1 tablet by mouth once daily   FLUoxetine 10 MG capsule Commonly known as: PROZAC Take by mouth daily.   FLUoxetine 20 MG capsule Commonly known as:  PROZAC Take 20 mg by mouth every morning.   fluticasone 50 MCG/ACT nasal spray Commonly known as: Flonase Place 1 spray into both nostrils 2 (two) times daily as needed (sinus congestion).   furosemide 40 MG tablet Commonly known as: LASIX TAKE 1 TABLET BY MOUTH EVERY DAY   gabapentin 100 MG capsule Commonly known as: NEURONTIN TAKE 1 CAPSULE BY MOUTH AT BEDTIME.   glimepiride 2 MG tablet Commonly known as: AMARYL Take 2 mg by mouth daily.   glucose blood test strip Monitor BS before meals and at bedtime for a couple of weeks. Then may decrease to bid before meals   lidocaine-prilocaine cream Commonly known as: EMLA Apply to affected area once   metoprolol succinate 50 MG 24 hr tablet Commonly known as: TOPROL-XL Take 1 tablet (50 mg total) by mouth daily. Take with or immediately following a meal.   nitroGLYCERIN 0.4 MG SL tablet Commonly known as: NITROSTAT Place 1 tablet (0.4 mg total) under the tongue every 5 (five) minutes as needed for chest pain.   onetouch ultrasoft lancets Use as instructed   potassium chloride SA 20 MEQ tablet Commonly known as: Klor-Con M20 Take 1 tablet (20 mEq total) by mouth daily.   pramoxine-hydrocortisone 1-1 % rectal cream Commonly known as: PROCTOCREAM-HC Place 1 application rectally 2 (two) times daily.   sertraline 100 MG tablet Commonly known as: ZOLOFT Take 100 mg by mouth daily.        Allergies:  Allergies  Allergen Reactions   Celexa [Citalopram] Other (See Comments)    Contraindicated with A-Fib   Ibuprofen Other (See Comments)    Was told to not take this    Metformin And Related Other (See Comments)    Bloody stools    Naproxen Other (See Comments)    Was told to not take this    Yellow Jacket Venom [Bee Venom] Swelling and Other (See Comments)    Severe swelling where stung   Penicillins Hives    Did it involve swelling of the face/tongue/throat, SOB, or low BP? Unk Did it involve sudden or severe  rash/hives, skin peeling, or any reaction on the inside of your mouth or nose? Yes Did you need to seek medical attention at a hospital or doctor's office? Unk When did it last happen? "Childhood- 55 years ago" If all above answers are "NO", may proceed with cephalosporin use.     Past Medical History, Surgical history, Social history, and Family History were reviewed and updated.  Review of Systems: .  Review of Systems  Constitutional: Negative.   HENT: Negative.    Eyes: Negative.   Respiratory: Negative.    Cardiovascular: Negative.   Gastrointestinal: Negative.   Genitourinary: Negative.   Musculoskeletal:  Positive for joint pain.  Skin: Negative.   Neurological: Negative.   Endo/Heme/Allergies: Negative.   Psychiatric/Behavioral: Negative.      Physical Exam:  weight is 223 lb 8 oz (101.4 kg). His oral temperature is 98.4 F (36.9 C). His blood pressure is 128/86 and his pulse is 70. His respiration is 18 and oxygen saturation is 99%.   Wt Readings from Last 3 Encounters:  02/14/21 223 lb 8 oz (101.4 kg)  01/16/21 224 lb (101.6 kg)  12/14/20 222 lb (100.7 kg)    Physical Exam Vitals reviewed.  HENT:     Head: Normocephalic and atraumatic.  Eyes:     Pupils: Pupils are equal, round, and reactive to light.  Cardiovascular:     Rate and Rhythm: Normal rate and regular rhythm.     Heart sounds: Normal heart sounds.  Pulmonary:     Effort: Pulmonary effort is normal.     Breath sounds: Normal breath sounds.  Abdominal:     General: Bowel sounds are normal.     Palpations: Abdomen is soft.  Musculoskeletal:        General: No tenderness or deformity. Normal range of motion.     Cervical back: Normal range of motion.     Comments: He does have some swelling in the fingers of his right hand.  The main finger is his second finger.  This is quite swollen.  He does have some limited range of motion of his fingers.  It is hard for him to make a fist.  Lymphadenopathy:      Cervical: No cervical adenopathy.  Skin:    General: Skin is warm and dry.     Findings: No erythema or rash.  Neurological:     Mental Status: He is alert and oriented to person, place, and time.  Psychiatric:        Behavior: Behavior normal.        Thought Content: Thought content normal.        Judgment: Judgment normal.    Lab Results  Component Value Date   WBC 7.3 02/14/2021   HGB 12.0 (L) 02/14/2021   HCT 32.7 (L) 02/14/2021   MCV 99.7 02/14/2021   PLT 139 (L) 02/14/2021   Lab Results  Component Value Date   FERRITIN 307 12/12/2020   IRON 82 12/12/2020   TIBC 308 12/12/2020   UIBC 226 12/12/2020   IRONPCTSAT 27 12/12/2020   Lab Results  Component Value Date   RETICCTPCT 5.4 (H) 08/15/2020   RBC 3.28 (L) 02/14/2021   No results found for: KPAFRELGTCHN, LAMBDASER, KAPLAMBRATIO No results found for: IGGSERUM, IGA, IGMSERUM No results found for: Odetta Pink, SPEI   Chemistry      Component Value Date/Time   NA 139 02/14/2021 0936   NA 140 08/28/2019 0940   K 3.2 (L) 02/14/2021 0936   CL 103 02/14/2021 0936   CO2 26 02/14/2021 0936   BUN 19 02/14/2021 0936   BUN 21 08/28/2019 0940   CREATININE 1.01 02/14/2021 0936      Component Value Date/Time   CALCIUM 9.3 02/14/2021 0936   ALKPHOS 61 02/14/2021 0936   AST 16 02/14/2021 0936   ALT 15 02/14/2021 0936   BILITOT 1.7 (H) 02/14/2021  0936       Impression and Plan: John Douglas is a pleasant 65 yo caucasian gentleman with  history of CVA secondary to atrial fib and rectal bleeding. He was diagnosed with metastatic adenocarcinoma of the rectum-K-ras wild type/BRAF wild-type/HER-2 negative/MMR proficient. He had adenopathy distant to the rectum.   Has been on aggressive treatment to date.  He has had a full dose systemic chemotherapy and then chemotherapy with radiation therapy.   Currently, he is on maintenance therapy with Xeloda/Avastin.  I still  think would be a good idea to see if surgery might be able to resect out the primary.  I would like to believe that this would be possible.  Again, show much more chemotherapy is going to benefit John Douglas.  He is done really well.  He has had normalization of his CEA level.  We will see about surgery seeing him.  We will try to call them and see if they cannot get him in and maybe do surgery before Thanksgiving.  He will continue his Xeloda.  I just feel bad that there is issues with his family.  I understand their family's concerns.  I know they just want what is best for him.    Volanda Napoleon, MD 10/18/202210:06 AM

## 2021-02-14 NOTE — Patient Instructions (Signed)
Tunneled Central Venous Catheter Flushing Guide It is important to flush your tunneled central venous catheter each time you use it, both before and after you use it. Flushing your catheter will help prevent it from clogging. What are the risks? Risks may include: Infection. Air getting into the catheter and bloodstream. Supplies needed: A clean pair of gloves. A disinfecting wipe. Use an alcohol wipe, chlorhexidine wipe, or iodine wipe as told by your health care provider. A 10 mL syringe that has been prefilled with saline solution. An empty 10 mL syringe, if a substance called heparin was injected into your catheter. How to flush your catheter When you flush your catheter, make sure you follow any specific instructions from your health care provider or the manufacturer. These are general guidelines. Flushing your catheter before use If there is heparin in your catheter: Wash your hands with soap and water. Put on gloves. Scrub the injection cap for a minimum of 15 seconds with a disinfecting wipe. Unclamp the catheter. Attach the empty syringe to the injection cap. Pull the syringe plunger back and withdraw 10 mL of blood. Place the syringe into an appropriate waste container. Scrub the injection cap for 15 seconds with a disinfecting wipe. Attach the prefilled syringe to the injection cap. Flush the catheter by pushing the plunger forward until all the liquid from the syringe is in the catheter. Remove the syringe from the injection cap. Clamp the catheter. If there is no heparin in your catheter: Wash your hands with soap and water. Put on gloves. Scrub the injection cap for 15 seconds with a disinfecting wipe. Unclamp the catheter. Attach the prefilled syringe to the injection cap. Flush the catheter by pushing the plunger forward until 5 mL of the liquid from the syringe is in the catheter. Pull back on the syringe until you see blood in the catheter. If you have been asked  to collect any blood, follow your health care provider's instructions. Otherwise, flush the catheter with the rest of the solution from the syringe. Remove the syringe from the injection cap. Clamp the catheter.  Flushing your catheter after use Wash your hands with soap and water. Put on gloves. Scrub the injection cap for 15 seconds with a disinfecting wipe. Unclamp the catheter. Attach the prefilled syringe to the injection cap. Flush the catheter by pushing the plunger forward until all of the liquid from the syringe is in the catheter. Remove the syringe from the injection cap. Clamp the catheter. Problems and solutions If blood cannot be completely cleared from the injection cap, you may need to have the injection cap replaced. If the catheter is difficult to flush, use the pulsing method. The pulsing method involves pushing only a few milliliters of solution into the catheter at a time and pausing between pushes. If you do not see blood in the catheter when you pull back on the syringe, change your body position, such as by raising your arms above your head. Take a deep breath and cough. Then, pull back on the syringe. If you still do not see blood, flush the catheter with a small amount of solution. Then, change positions again and take a breath or cough. Pull back on the syringe again. If you still do not see blood, finish flushing the catheter and contact your health care provider. Do not use your catheter until your health care provider says it is okay. General tips Have someone help you flush your catheter, if possible. Do not force fluid   through your catheter. Do not use a syringe that is larger or smaller than 10 mL. Using a smaller syringe can make the catheter burst. Do not use your catheter without flushing it first if it has heparin in it. Contact a health care provider if: You cannot see any blood in the catheter when you flush it before using it. Your catheter is difficult  to flush. Get help right away if: You cannot flush the catheter. The catheter leaks when you flush it or when there is fluid in it. There are cracks or breaks in the catheter. Summary It is important to flush your tunneled central venous catheter each time you use it, both before and after you use it. Scrub the injection cap for 15 seconds with a disinfecting wipe before and after you flush it. When you flush your catheter, make sure you follow any specific instructions from your health care provider or the manufacturer. Get help right away if you cannot flush the catheter. This information is not intended to replace advice given to you by your health care provider. Make sure you discuss any questions you have with your health care provider. Document Revised: 06/25/2019 Document Reviewed: 07/02/2018 Elsevier Patient Education  2022 [employer]  

## 2021-02-14 NOTE — Addendum Note (Signed)
Addended by: Melton Krebs on: 02/14/2021 11:01 AM   Modules accepted: Orders, SmartSet

## 2021-02-15 ENCOUNTER — Telehealth: Payer: Self-pay | Admitting: Hematology & Oncology

## 2021-02-15 NOTE — Telephone Encounter (Signed)
No los 10/18

## 2021-02-15 NOTE — Addendum Note (Signed)
Addended by: Burney Gauze R on: 02/15/2021 01:13 PM   Modules accepted: Orders

## 2021-02-16 ENCOUNTER — Other Ambulatory Visit (HOSPITAL_COMMUNITY): Payer: Self-pay

## 2021-03-02 ENCOUNTER — Other Ambulatory Visit (HOSPITAL_COMMUNITY): Payer: Self-pay

## 2021-03-06 ENCOUNTER — Other Ambulatory Visit (HOSPITAL_COMMUNITY): Payer: Self-pay

## 2021-03-07 ENCOUNTER — Other Ambulatory Visit (HOSPITAL_COMMUNITY): Payer: Self-pay

## 2021-03-07 ENCOUNTER — Telehealth: Payer: Self-pay | Admitting: Internal Medicine

## 2021-03-07 NOTE — Telephone Encounter (Signed)
   [facility] Pre-operative Risk Assessment    Patient Name: Opie Maclaughlin  DOB: May 22, 1955 MRN: [id number]  HEARTCARE STAFF:  - IMPORTANT!!!!!! Under Visit Info/Reason for Call, type in Other and utilize the format Clearance MM/DD/YY or Clearance TBD. Do not use dashes or single digits. - Please review there is not already an duplicate clearance open for this procedure. - If request is for dental extraction, please clarify the # of teeth to be extracted. - If the patient is currently at the dentist's office, call Pre-Op Callback Staff (MA/nurse) to input urgent request.  - If the patient is not currently in the dentist office, please route to the Pre-Op pool.  Request for surgical clearance:  What type of surgery is being performed? Rectal Cancer Surgery   When is this surgery scheduled? ASAP   What type of clearance is required (medical clearance vs. Pharmacy clearance to hold med vs. Both)? Both   Are there any medications that need to be held prior to surgery and how long? Eliquis instructions   Practice name and name of physician performing surgery? [facility]  What is the office phone number? [phone]   7.   What is the office fax number? [phone]  8.   Anesthesia type (None, local, MAC, general) ? general   Ace Gins 03/07/2021, 2:14 PM  _________________________________________________________________   (provider comments below)

## 2021-03-07 NOTE — Telephone Encounter (Signed)
Clinical pharmacist to review Eliquis. Patient has preop visit tomorrow

## 2021-03-07 NOTE — Telephone Encounter (Signed)
Patient with diagnosis of afib on Eliquis for anticoagulation.    Procedure: rectal cancer surgery Date of procedure: ASAP   CHA2DS2-VASc Score = 7  This indicates a 11.2% annual risk of stroke. The patient's score is based upon: CHF History: 1 HTN History: 1 Diabetes History: 1 Stroke History: 2 Vascular Disease History: 1 Age Score: 1 Gender Score: 0   CrCl 85.48 mL/min using adjusted body weight due to obesity Platelet count 139K  Per note from [facility] on 11/7, patient reported CVA was embolic secondary to coming off anticoagulation for colonoscopy. Due to patient's elevated CV risk, prefer to limit time off anticoagulation as much as possible. Would recommend he hold Eliquis for 1 day prior to procedure and resume as soon as safely possible after. If longer than 1 day hold is required, would defer to MD regarding potential bridging with Lovenox (also at a higher bleed risk due to history of severe GI bleeding in the setting of rectal cancer).

## 2021-03-07 NOTE — Progress Notes (Signed)
Follow-up Outpatient Visit Date: 03/08/2021  Primary Care Provider: Ailene Ravel, MD [address] [place] [address]  Chief Complaint: Discussed rectal surgery  HPI:  [name] is a 65 y.o. male with history of CAD with inferior STEMI and primary PCI to the RCA in 03/2019 complicated by sustained ventricular tachycardia in the setting of residual left coronary artery disease status post subsequent PCI to the mid LAD (06/2019), hypertension, hyperlipidemia, paroxysmal atrial fibrillation complicated by stroke in 09/2019, ischemic cardiomyopathy, type 2 diabetes mellitus, and recurrent GI bleeding with subsequent diagnosis of rectal cancer s/p neoadjuvant chemotherapy and XRT, who presents for preoperative assessment in anticipation of possible rectal surgery.  I last saw him in August, at which time he was feeling fairly well other than some stiffness, swelling, and tenderness in the right hand.  He was doing well from a heart standpoint other than occasional transient fatigue and insomnia.  Today, [name] reports that he continues to feel fairly well.  At times, he wonders if he might have a little more shortness of breath with strenuous activities compared to prior visits, though he also says he overall feels better.  He has not had any chest pain, palpitations, lightheadedness, or edema.  He recently met with Dr. Cliffton Asters [facility]) to discuss low anterior resection of his rectal cancer.  [name] was advised that he would likely need a diverting ostomy and could ultimately Codee Bloodworth up with a permanent ostomy if he does not have adequate healing in the surgical field.  He is particularly concerned about the risk for recurrent stroke, given prior stroke in the setting of anticoagulation interruption for colonoscopy.  He remains compliant with apixaban.  --------------------------------------------------------------------------------------------------  Past Medical History:   Diagnosis Date   Allergic rhinitis    [facility]   Anxiety    Benign essential hypertension    [facility]   Coronary artery disease    [facility]   Diabetic neuropathy, type II diabetes mellitus (HCC)    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) 11/17/2019   Stroke (HCC)    Wide-complex tachycardia 06/04/2019   Past Surgical History:  Procedure Laterality Date   BIOPSY  10/07/2019   Procedure: BIOPSY;  Surgeon: Shellia Cleverly, DO;  Location: [facility];  Service: Gastroenterology;;   COLONOSCOPY WITH PROPOFOL N/A 10/07/2019   Procedure: COLONOSCOPY WITH PROPOFOL;  Surgeon: Shellia Cleverly, DO;  Location: [facility];  Service: Gastroenterology;  Laterality: N/A;   CORONARY ANGIOPLASTY     3 stents   IR IMAGING GUIDED PORT INSERTION  11/24/2019   LEFT HEART CATH AND CORONARY ANGIOGRAPHY N/A 06/05/2019   Procedure: LEFT HEART CATH AND CORONARY ANGIOGRAPHY;  Surgeon: Yvonne Kendall, MD;  Location: [facility];  Service: Cardiovascular;  Laterality: N/A;   POLYPECTOMY  10/07/2019   Procedure: POLYPECTOMY;  Surgeon: Shellia Cleverly, DO;  Location: [facility];  Service: Gastroenterology;;   SUBMUCOSAL TATTOO INJECTION  10/07/2019   Procedure: SUBMUCOSAL TATTOO INJECTION;  Surgeon: Doristine Locks  V, DO;  Location: [facility];  Service: Gastroenterology;;    Current Meds  Medication Sig   diltiazem (CARDIZEM CD) 180 MG 24 hr capsule Take 1 capsule (180 mg total) by mouth daily.   ELIQUIS 5 MG TABS tablet TAKE  1 TABLET BY MOUTH TWICE A DAY   ezetimibe (ZETIA) 10 MG tablet Take 1 tablet by mouth once daily   FLUoxetine (PROZAC) 20 MG capsule Take 20 mg by mouth every morning.   fluticasone (FLONASE) 50 MCG/ACT nasal spray Place 1 spray into both nostrils 2 (two) times daily as needed (sinus congestion).   furosemide (LASIX) 40 MG tablet TAKE 1 TABLET BY MOUTH EVERY DAY   gabapentin (NEURONTIN) 100 MG capsule TAKE 1 CAPSULE BY MOUTH AT BEDTIME.   glimepiride (AMARYL) 2 MG tablet Take 2 mg by mouth daily.   glucose blood test strip Monitor BS before meals and at bedtime for a couple of weeks. Then may decrease to bid before meals   Lancets (ONETOUCH ULTRASOFT) lancets Use as instructed   metoprolol succinate (TOPROL-XL) 50 MG 24 hr tablet Take 1 tablet (50 mg total) by mouth daily. Take with or immediately following a meal.   nitroGLYCERIN (NITROSTAT) 0.4 MG SL tablet Place 1 tablet (0.4 mg total) under the tongue every 5 (five) minutes as needed for chest pain.   Potassium Chloride ER 20 MEQ TBCR Take 2 tablets by mouth daily.   pramoxine-hydrocortisone (PROCTOCREAM-HC) 1-1 % rectal cream Place 1 application rectally 2 (two) times daily as needed for hemorrhoids or anal itching.    Allergies: Celexa [citalopram], Ibuprofen, Metformin and related, Naproxen, Yellow jacket venom [bee venom], and Penicillins  Social History   Tobacco Use   Smoking status: Never   Smokeless tobacco: Former    Types: Chew    Quit date: 05/04/2019  Vaping Use   Vaping Use: Never used  Substance Use Topics   Alcohol use: Not Currently    Comment: occasional   Drug use: Yes    Types: Marijuana    Comment: occassionally    Family History  Problem Relation Age of Onset   Diverticulitis Mother    Hypertension Father    Heart disease Father        MI   Prostate cancer Father    Colon cancer Neg Hx    Stomach cancer Neg Hx    Pancreatic cancer Neg Hx    Rectal cancer Neg Hx     Review of Systems: A 12-system  review of systems was performed and was negative except as noted in the HPI.  --------------------------------------------------------------------------------------------------  Physical Exam: BP 120/80 (BP Location: Left Arm, Patient Position: Sitting, Cuff Size: Large)   Pulse 63   Ht $R'5\' 10"'Gr$  (1.778 m)   Wt 224 lb (101.6 kg)   SpO2 98%   BMI 32.14 kg/m   General:  NAD. Neck: No JVD or HJR. Lungs: Clear to auscultation bilaterally without wheezes or crackles. Heart: Regular rate and rhythm without murmurs, rubs, or gallops. Abdomen: Soft, nontender, nondistended. Extremities: No lower extremity edema.  EKG: Normal sinus rhythm with first-degree AV block (PR interval 218 ms and lateral ST/T changes.  Heart rate has increased since 12/14/2020.  Otherwise, there has been no significant interval change.  Lab Results  Component Value Date   WBC 7.3 02/14/2021   HGB 12.0 (L) 02/14/2021   HCT 32.7 (L) 02/14/2021   MCV 99.7 02/14/2021   PLT 139 (L) 02/14/2021    Lab Results  Component Value  Date   NA 139 02/14/2021   K 3.2 (L) 02/14/2021   CL 103 02/14/2021   CO2 26 02/14/2021   BUN 19 02/14/2021   CREATININE 1.01 02/14/2021   GLUCOSE 151 (H) 02/14/2021   ALT 15 02/14/2021    Lab Results  Component Value Date   CHOL 148 10/09/2019   HDL 26 (L) 10/09/2019   LDLCALC 98 10/09/2019   TRIG 119 10/09/2019   CHOLHDL 5.7 10/09/2019    --------------------------------------------------------------------------------------------------  ASSESSMENT AND PLAN: Coronary artery disease without angina: Overall, [name] reports feeling well though he makes note of some increased exertional dyspnea over the last few months.  Echocardiogram earlier this year showed low normal LVEF.  He has not undergone repeat ischemia evaluation since his PCI to the LAD in 06/2019.  I think it is reasonable to continue with medical therapy and close surveillance.  We will plan to continue secondary  prevention with ezetimibe given history of statin intolerance and excellent LDL on last check through PCP in 05/2020.  If he were to have worsening exertional dyspnea or develop chest pain, we would need to consider repeat ischemia evaluation.  Persistent atrial fibrillation: [name] maintains sinus rhythm today.  Heart rate has increased slightly from our last visit when metoprolol succinate was decreased to 50 mg daily.  PR interval remains minimally increased.  We will continue current doses of diltiazem and metoprolol as well as indefinite anticoagulation with apixaban.  Where he to need interruption of anticoagulation for surgical procedures, I would favor bridging with enoxaparin.  Chronic HFpEF: [name] appears euvolemic with NYHA II symptoms.  No medication changes today.  Hyperlipidemia associated with type 2 diabetes mellitus: Continue ezetimibe 10 mg daily with history of statin intolerance and excellent lipid profile on last check through PCP in February (LDL 47, triglycerides 111).  Rectal cancer and preoperative cardiovascular risk assessment: [name] remains uncertain about whether or not to proceed with low anterior resection for management of his rectal cancer.  I think it would be reasonable for him to proceed without additional cardiac testing, given that he does not have any unstable cardiac symptoms.  Primary concern would be risk for thrombotic events including cardioembolic events in the setting of persistent atrial fibrillation (though he has been maintaining sinus rhythm for the last several months).  If he were to undergo surgery, I would advocate for bridging with enoxaparin or IV heparin before and after surgery, which our pharmacy team could help coordinate.  [name] wishes to contemplate surgery a little while longer given other perioperative risks.  I encouraged him to speak with Drs. Ennever and White regarding close monitoring of his rectal cancer and potential  for surgery down the road if it were to progress.  Follow-up: Return to clinic in 6 months.  Nelva Bush, MD 03/08/2021 11:48 AM

## 2021-03-08 ENCOUNTER — Ambulatory Visit: Payer: Medicare HMO | Admitting: Internal Medicine

## 2021-03-08 ENCOUNTER — Other Ambulatory Visit: Payer: Self-pay

## 2021-03-08 ENCOUNTER — Other Ambulatory Visit (HOSPITAL_COMMUNITY): Payer: Self-pay

## 2021-03-08 ENCOUNTER — Encounter: Payer: Self-pay | Admitting: Internal Medicine

## 2021-03-08 VITALS — BP 120/80 | HR 63 | Ht 70.0 in | Wt 224.0 lb

## 2021-03-08 DIAGNOSIS — Z01810 Encounter for preprocedural cardiovascular examination: Secondary | ICD-10-CM | POA: Diagnosis not present

## 2021-03-08 DIAGNOSIS — I2510 Atherosclerotic heart disease of native coronary artery without angina pectoris: Secondary | ICD-10-CM

## 2021-03-08 DIAGNOSIS — E785 Hyperlipidemia, unspecified: Secondary | ICD-10-CM

## 2021-03-08 DIAGNOSIS — I5032 Chronic diastolic (congestive) heart failure: Secondary | ICD-10-CM

## 2021-03-08 DIAGNOSIS — I4819 Other persistent atrial fibrillation: Secondary | ICD-10-CM

## 2021-03-08 DIAGNOSIS — C20 Malignant neoplasm of rectum: Secondary | ICD-10-CM

## 2021-03-08 DIAGNOSIS — E1169 Type 2 diabetes mellitus with other specified complication: Secondary | ICD-10-CM

## 2021-03-08 NOTE — Telephone Encounter (Signed)
Pt would be bridged with Lovenox for procedure if he holds Eliquis for 3 days prior, however MD performing procedure would want to delay resuming therapeutic anticoagulation for 48-72 hours post-procedure for total of 3-4 days off of anticoag. If not acceptable from cardiology standpoint, would not proceed with surgery. Prior stroke occurred in the setting of periprocedural anticoagulation hold which is typically only a few days for DOAC therapy. Will forward additional post-procedure info to Dr End to weigh in on risk.

## 2021-03-08 NOTE — Telephone Encounter (Signed)
I saw John Douglas in clinic today; please see my office note for further details regarding our discussion of possible surgery.  I think it would be okay for him to proceed with Lovenox bridging.  I would favor restarting IV heparin or Lovenox as soon as it is felt safe to do so afterwards.  Given his history of CVA while off anticoagulation for colonoscopy, I would be reluctant to hold anticoagulation much beyond 48 hours.  Fortunately, John Douglas has been in sinus rhythm the last few times that I saw him.  This may confer lower risk of perioperative stroke, though physiologic stress from a major surgery like the one he is planning to undergo could put him back into atrial fibrillation.  John Douglas is reluctant to proceed with the procedure.  I have advised him to reach out to John Douglas to see if there would be any problem with deferring surgery at least a few months to allow for further surveillance of his rectal cancer.  Nelva Bush, MD [facility]

## 2021-03-08 NOTE — Telephone Encounter (Signed)
S/w Dr. Harrell Gave White's surgery scheduler. I presented the question that our office is asking if Dr. Dema Severin is ok with holding Eliquis x 1 day, due to pt h/o of CVA and CHA2DS2-VASc score. If Dr. Dema Severin prefers Eliquis to be held longer than 1 day the pt will need to be bridged with Lovenox. Nurse will check with Dr. Dema Severin and will let us know his preference for Eliquis hold time.

## 2021-03-08 NOTE — Telephone Encounter (Signed)
Pre-op covering staff, please see note from Pharmacy below. Can you please check with surgeon's office to see if they are OK with only holding Eliquis for 1 day given history of CVA and elevated CHA2DS2-VASc score. If they are going to need a long hold, we are going to have to ask MD about Lovenox bridge.  Thank you!

## 2021-03-08 NOTE — Telephone Encounter (Signed)
See notes from Dr. Nadeen Landau: Ileana Roup, MD  Cherylin Mylar, Erick Blinks, [place] it. He would need to be off the Eliquis for 72 hrs prior to surgery so sounds like if he decides to have surgery, would need to be bridged. He wasn't sure he would proceed with surgery at present so some variables aren't known yet. I would also plan to hold his therapeutic doses of heparin/lovenox until he is at least 48-72 hrs from surgery (totaling minimum 3-4 days off all anticoagulation aside from prophylaxis). If that would be prohibitive from cardiology standpoint would not plan to proceed with surgery.   Can we relay this info please? Thank you for your help!   CW        I will forward to pre op pool and pre op pharm-d with the note from Dr. Dema Severin.

## 2021-03-08 NOTE — Telephone Encounter (Signed)
    Patient Name: John Douglas  DOB: 02-Oct-1955 MRN: [id number]  Primary Cardiologist: Nelva Bush, MD  Chart reviewed as part of pre-operative protocol coverage. Patient was seen by Dr. Saunders Revel today in the office. Per Dr. Darnelle Bos office visit note: "Rectal cancer and preoperative cardiovascular risk assessment: [name] remains uncertain about whether or not to proceed with low anterior resection for management of his rectal cancer.  I think it would be reasonable for him to proceed without additional cardiac testing, given that he does not have any unstable cardiac symptoms.  Primary concern would be risk for thrombotic events including cardioembolic events in the setting of persistent atrial fibrillation (though he has been maintaining sinus rhythm for the last several months).  If he were to undergo surgery, I would advocate for bridging with enoxaparin or IV heparin before and after surgery, which our pharmacy team could help coordinate.  [name] wishes to contemplate surgery a little while longer given other perioperative risks.  I encouraged him to speak with Drs. Ennever and White regarding close monitoring of his rectal cancer and potential for surgery down the road if it were to progress." Dr. Saunders Revel also stated, "I would favor restarting IV heparin or Lovenox as soon as it is felt safe to do so afterwards.  Given his history of CVA while off anticoagulation for colonoscopy, I would be reluctant to hold anticoagulation much beyond 48 hours."  Please let our office know if patient decides to proceed with surgery so that we can help arrange Lovenox bridge.   I will route this recommendation to the requesting party via Epic fax function and remove from pre-op pool.  Please call with questions.  Darreld Mclean, PA-C 03/08/2021, 5:11 PM

## 2021-03-08 NOTE — Patient Instructions (Signed)
Medication Instructions:   Your physician recommends that you continue on your current medications as directed. Please refer to the Current Medication list given to you today.  *If you need a refill on your cardiac medications before your next appointment, please call your pharmacy*   Lab Work:  None ordered  Testing/Procedures:  None ordered   Follow-Up: At [facility], you and your health needs are our priority.  As part of our continuing mission to provide you with exceptional heart care, we have created designated Provider Care Teams.  These Care Teams include your primary Cardiologist (physician) and Advanced Practice Providers (APPs -  Physician Assistants and Nurse Practitioners) who all work together to provide you with the care you need, when you need it.  We recommend signing up for the patient portal called "MyChart".  Sign up information is provided on this After Visit Summary.  MyChart is used to connect with patients for Virtual Visits (Telemedicine).  Patients are able to view lab/test results, encounter notes, upcoming appointments, etc.  Non-urgent messages can be sent to your provider as well.   To learn more about what you can do with MyChart, go to [email or web address].    Your next appointment:   6 month(s)  The format for your next appointment:   In Person  Provider:   You may see Nelva Bush, MD or one of the following Advanced Practice Providers on your designated Care Team:   Murray Hodgkins, NP Christell Faith, PA-C Cadence Kathlen Mody, [place]

## 2021-03-20 ENCOUNTER — Other Ambulatory Visit: Payer: Self-pay | Admitting: Internal Medicine

## 2021-03-21 ENCOUNTER — Other Ambulatory Visit (HOSPITAL_COMMUNITY): Payer: Self-pay

## 2021-03-21 ENCOUNTER — Telehealth: Payer: Self-pay | Admitting: Hematology & Oncology

## 2021-03-29 ENCOUNTER — Other Ambulatory Visit (HOSPITAL_COMMUNITY): Payer: Self-pay

## 2021-03-29 ENCOUNTER — Other Ambulatory Visit: Payer: Medicare HMO

## 2021-04-06 ENCOUNTER — Inpatient Hospital Stay (HOSPITAL_BASED_OUTPATIENT_CLINIC_OR_DEPARTMENT_OTHER): Payer: Medicare HMO

## 2021-04-06 ENCOUNTER — Inpatient Hospital Stay: Payer: Medicare HMO

## 2021-04-06 ENCOUNTER — Other Ambulatory Visit: Payer: Self-pay

## 2021-04-06 DIAGNOSIS — Z8673 Personal history of transient ischemic attack (TIA), and cerebral infarction without residual deficits: Secondary | ICD-10-CM | POA: Diagnosis not present

## 2021-04-06 DIAGNOSIS — Z79899 Other long term (current) drug therapy: Secondary | ICD-10-CM | POA: Insufficient documentation

## 2021-04-06 DIAGNOSIS — Z7982 Long term (current) use of aspirin: Secondary | ICD-10-CM | POA: Insufficient documentation

## 2021-04-06 DIAGNOSIS — C20 Malignant neoplasm of rectum: Secondary | ICD-10-CM | POA: Diagnosis present

## 2021-04-06 DIAGNOSIS — Z9221 Personal history of antineoplastic chemotherapy: Secondary | ICD-10-CM | POA: Diagnosis not present

## 2021-04-06 DIAGNOSIS — Z7901 Long term (current) use of anticoagulants: Secondary | ICD-10-CM | POA: Diagnosis not present

## 2021-04-06 DIAGNOSIS — Z452 Encounter for adjustment and management of vascular access device: Secondary | ICD-10-CM | POA: Insufficient documentation

## 2021-04-06 DIAGNOSIS — I4891 Unspecified atrial fibrillation: Secondary | ICD-10-CM | POA: Diagnosis not present

## 2021-04-06 DIAGNOSIS — Z923 Personal history of irradiation: Secondary | ICD-10-CM | POA: Diagnosis not present

## 2021-04-06 MED ORDER — SODIUM CHLORIDE 0.9% FLUSH
10.0000 mL | Freq: Once | INTRAVENOUS | Status: AC
  Administered 2021-04-06: 10 mL via INTRAVENOUS

## 2021-04-06 MED ORDER — HEPARIN SOD (PORK) LOCK FLUSH 100 UNIT/ML IV SOLN
500.0000 [IU] | Freq: Once | INTRAVENOUS | Status: AC
  Administered 2021-04-06: 500 [IU] via INTRAVENOUS

## 2021-04-06 NOTE — Patient Instructions (Signed)

## 2021-04-06 NOTE — Progress Notes (Signed)
Patient declined labwork today, states he just had it done at [facility] 2 days ago and will have them fax it over. Port flushed and deaccessed.

## 2021-04-07 ENCOUNTER — Other Ambulatory Visit: Payer: Self-pay | Admitting: Internal Medicine

## 2021-04-12 ENCOUNTER — Other Ambulatory Visit: Payer: Self-pay | Admitting: Hematology & Oncology

## 2021-04-20 ENCOUNTER — Other Ambulatory Visit (HOSPITAL_COMMUNITY): Payer: Self-pay

## 2021-04-21 ENCOUNTER — Inpatient Hospital Stay: Payer: Medicare HMO | Attending: Hematology & Oncology | Admitting: Hematology & Oncology

## 2021-04-21 ENCOUNTER — Other Ambulatory Visit: Payer: Self-pay | Admitting: *Deleted

## 2021-04-21 ENCOUNTER — Telehealth: Payer: Self-pay | Admitting: *Deleted

## 2021-04-21 DIAGNOSIS — C20 Malignant neoplasm of rectum: Secondary | ICD-10-CM

## 2021-04-21 NOTE — Telephone Encounter (Signed)
Opened in error

## 2021-04-21 NOTE — Telephone Encounter (Signed)
Message received from patient stating that he needs a refill of Xeloda and has decided against surgery at this time. Pt also states that he was not able to make his appt with Dr. Marin Olp today because his eyes and nose are running. Dr. Marin Olp notified.  Order received to not refill Xeloda at this time and that Dr. Marin Olp will discuss Xeloda with pt at next scheduled appt.  Pt notified of above.  Message sent to scheduling.

## 2021-04-27 ENCOUNTER — Inpatient Hospital Stay: Payer: Medicare HMO | Admitting: Hematology & Oncology

## 2021-04-27 ENCOUNTER — Other Ambulatory Visit: Payer: Self-pay

## 2021-04-27 ENCOUNTER — Inpatient Hospital Stay: Payer: Medicare HMO

## 2021-04-27 ENCOUNTER — Encounter: Payer: Self-pay | Admitting: Hematology & Oncology

## 2021-04-27 VITALS — BP 146/84 | HR 60 | Temp 98.7°F | Resp 18

## 2021-04-27 VITALS — Wt 225.0 lb

## 2021-04-27 DIAGNOSIS — Z452 Encounter for adjustment and management of vascular access device: Secondary | ICD-10-CM | POA: Diagnosis not present

## 2021-04-27 DIAGNOSIS — C20 Malignant neoplasm of rectum: Secondary | ICD-10-CM | POA: Diagnosis not present

## 2021-04-27 DIAGNOSIS — Z95828 Presence of other vascular implants and grafts: Secondary | ICD-10-CM

## 2021-04-27 LAB — URINALYSIS, DIPSTICK ONLY
Bilirubin Urine: NEGATIVE
Glucose, UA: NEGATIVE mg/dL
Hgb urine dipstick: NEGATIVE
Ketones, ur: NEGATIVE mg/dL
Leukocytes,Ua: NEGATIVE
Nitrite: NEGATIVE
Protein, ur: NEGATIVE mg/dL
Specific Gravity, Urine: 1.015 (ref 1.005–1.030)
pH: 5 (ref 5.0–8.0)

## 2021-04-27 LAB — CBC WITH DIFFERENTIAL (CANCER CENTER ONLY)
Abs Immature Granulocytes: 0.06 10*3/uL (ref 0.00–0.07)
Basophils Absolute: 0.1 10*3/uL (ref 0.0–0.1)
Basophils Relative: 1 %
Eosinophils Absolute: 0.1 10*3/uL (ref 0.0–0.5)
Eosinophils Relative: 2 %
HCT: 34.3 % — ABNORMAL LOW (ref 39.0–52.0)
Hemoglobin: 12.4 g/dL — ABNORMAL LOW (ref 13.0–17.0)
Immature Granulocytes: 1 %
Lymphocytes Relative: 15 %
Lymphs Abs: 1.1 10*3/uL (ref 0.7–4.0)
MCH: 39.2 pg — ABNORMAL HIGH (ref 26.0–34.0)
MCHC: 36.2 g/dL — ABNORMAL HIGH (ref 30.0–36.0)
MCV: 108.5 fL — ABNORMAL HIGH (ref 80.0–100.0)
Monocytes Absolute: 0.5 10*3/uL (ref 0.1–1.0)
Monocytes Relative: 6 %
Neutro Abs: 5.4 10*3/uL (ref 1.7–7.7)
Neutrophils Relative %: 75 %
Platelet Count: 125 10*3/uL — ABNORMAL LOW (ref 150–400)
RBC: 3.16 MIL/uL — ABNORMAL LOW (ref 4.22–5.81)
RDW: 15.2 % (ref 11.5–15.5)
WBC Count: 7.1 10*3/uL (ref 4.0–10.5)
nRBC: 0 % (ref 0.0–0.2)

## 2021-04-27 LAB — CMP (CANCER CENTER ONLY)
ALT: 16 U/L (ref 0–44)
AST: 15 U/L (ref 15–41)
Albumin: 4.1 g/dL (ref 3.5–5.0)
Alkaline Phosphatase: 64 U/L (ref 38–126)
Anion gap: 9 (ref 5–15)
BUN: 21 mg/dL (ref 8–23)
CO2: 28 mmol/L (ref 22–32)
Calcium: 9.3 mg/dL (ref 8.9–10.3)
Chloride: 104 mmol/L (ref 98–111)
Creatinine: 0.98 mg/dL (ref 0.61–1.24)
GFR, Estimated: >60 mL/min (ref >60)
Glucose, Bld: 197 mg/dL — ABNORMAL HIGH (ref 70–99)
Potassium: 3.4 mmol/L — ABNORMAL LOW (ref 3.5–5.1)
Sodium: 141 mmol/L (ref 135–145)
Total Bilirubin: 1.7 mg/dL — ABNORMAL HIGH (ref 0.3–1.2)
Total Protein: 6.2 g/dL — ABNORMAL LOW (ref 6.5–8.1)

## 2021-04-27 LAB — TOTAL PROTEIN, URINE DIPSTICK: Protein, ur: NEGATIVE mg/dL

## 2021-04-27 LAB — LACTATE DEHYDROGENASE: LDH: 253 U/L — ABNORMAL HIGH (ref 98–192)

## 2021-04-27 MED ORDER — HEPARIN SOD (PORK) LOCK FLUSH 100 UNIT/ML IV SOLN
500.0000 [IU] | Freq: Once | INTRAVENOUS | Status: AC
  Administered 2021-04-27: 11:00:00 500 [IU] via INTRAVENOUS

## 2021-04-27 MED ORDER — SODIUM CHLORIDE 0.9% FLUSH
10.0000 mL | INTRAVENOUS | Status: DC | PRN
  Administered 2021-04-27: 11:00:00 10 mL via INTRAVENOUS

## 2021-04-27 NOTE — Progress Notes (Signed)
Hematology and Oncology Follow Up Visit  John Douglas [id number] July 25, 1955 65 y.o. 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 twice daily Aspirin 81 mg p.o. daily Zirabev (biosimilar Avastin)/Xeloda maintenance - started 10/12/2020   Interim History:  John Douglas is here today for follow-up.  Is been a couple months since we last saw him.  We did send him over to surgery to see if they would recommend any type of surgical resection of his disease.  This surgeon did feel that he was surgically resectable.  However, John Douglas is a little bit hesitant because of the chance of him having a colostomy.  I talked to John Douglas at length.  I told him that at some point, chemotherapy is not going to work.  He will become a resilient to therapy and he will then have progressive disease will eventually take him.  I told him that I thought that the best way for him to have a long-term remission and potential cure would be surgical resection.  I think it be worthwhile doing another PET scan on him.  His last 1 was I think 3 or 4 months ago.  His last CEA level back in October was 1.45.  He has had no problems with cough or shortness of breath.  I think he did have influenza which is why he did not come back in November.  He has had no change in bowel or bladder habits.  He has not had any abdominal pain.  His appetite is okay.  He has had no cough or shortness of breath.  He has had no fever.  He has had no bleeding.  He has had no leg swelling.  He has seemed to gotten over his CVA quite nicely.  I think he is on Eliquis.  Currently, I  would have to say that his performance status is probably ECOG 1.    Medications:  Allergies as of 04/27/2021       Reactions   Celexa [citalopram] Other (See Comments)   Contraindicated with A-Fib   Ibuprofen Other (See Comments)   Was told to not take this    Metformin And Related Other (See Comments)   Bloody stools    Naproxen Other (See Comments)   Was told to not take this    Yellow Jacket Venom [bee Venom] Swelling, Other (See Comments)   Severe swelling where stung   Penicillins Hives   Did it involve swelling of the face/tongue/throat, SOB, or low BP? Unk Did it involve sudden or severe rash/hives, skin peeling, or any reaction on the inside of your mouth or nose? Yes Did you need to seek medical attention at a hospital or doctor's office? Unk When did it last happen? "Childhood- 55 years ago" If all above answers are "NO", may proceed with cephalosporin use.        Medication List        Accurate as of April 27, 2021  4:29 PM. If you have any questions, ask your nurse or doctor.          capecitabine 500 MG tablet Commonly known as: Xeloda Take 4 tablets (2,000 mg total) by mouth 2 (two) times  daily after a meal. Take for 14 days, then hold for 7 days. Repeat every 21 days.   diltiazem 180 MG 24 hr capsule Commonly known as: CARDIZEM CD Take 1 capsule (180 mg total) by mouth daily.   Eliquis 5 MG Tabs tablet Generic drug: apixaban TAKE 1 TABLET BY MOUTH TWICE A DAY   ezetimibe 10 MG tablet Commonly known as: ZETIA Take 1 tablet by mouth once daily   FLUoxetine 20 MG capsule Commonly known as: PROZAC Take 20 mg by mouth every morning.   fluticasone 50 MCG/ACT nasal spray Commonly known as: Flonase Place 1 spray into both nostrils 2 (two) times daily as needed (sinus congestion).   furosemide 40 MG tablet Commonly known as: LASIX TAKE 1 TABLET BY MOUTH EVERY DAY   gabapentin 100 MG capsule Commonly known as: NEURONTIN TAKE 1 CAPSULE BY  MOUTH AT BEDTIME.   glimepiride 1 MG tablet Commonly known as: AMARYL Take 1 mg by mouth daily.   glucose blood test strip Monitor BS before meals and at bedtime for a couple of weeks. Then may decrease to bid before meals   metoprolol succinate 50 MG 24 hr tablet Commonly known as: TOPROL-XL Take 1 tablet (50 mg total) by mouth daily. Take with or immediately following a meal.   nitroGLYCERIN 0.4 MG SL tablet Commonly known as: NITROSTAT Place 1 tablet (0.4 mg total) under the tongue every 5 (five) minutes as needed for chest pain.   onetouch ultrasoft lancets Use as instructed   Potassium Chloride ER 20 MEQ Tbcr Take 2 tablets by mouth daily.   pramoxine-hydrocortisone 1-1 % rectal cream Commonly known as: PROCTOCREAM-HC Place 1 application rectally 2 (two) times daily as needed for hemorrhoids or anal itching.        Allergies:  Allergies  Allergen Reactions   Celexa [Citalopram] Other (See Comments)    Contraindicated with A-Fib   Ibuprofen Other (See Comments)    Was told to not take this    Metformin And Related Other (See Comments)    Bloody stools    Naproxen Other (See Comments)    Was told to not take this    Yellow Jacket Venom [Bee Venom] Swelling and Other (See Comments)    Severe swelling where stung   Penicillins Hives    Did it involve swelling of the face/tongue/throat, SOB, or low BP? Unk Did it involve sudden or severe rash/hives, skin peeling, or any reaction on the inside of your mouth or nose? Yes Did you need to seek medical attention at a hospital or doctor's office? Unk When did it last happen? "Childhood- 55 years ago" If all above answers are "NO", may proceed with cephalosporin use.     Past Medical History, Surgical history, Social history, and Family History were reviewed and updated.  Review of Systems: . Review of Systems  Constitutional: Negative.   HENT: Negative.    Eyes: Negative.   Respiratory: Negative.     Cardiovascular: Negative.   Gastrointestinal: Negative.   Genitourinary: Negative.   Musculoskeletal:  Positive for joint pain.  Skin: Negative.   Neurological: Negative.   Endo/Heme/Allergies: Negative.   Psychiatric/Behavioral: Negative.      Physical Exam:  weight is 225 lb (102.1 kg).   Wt Readings from Last 3 Encounters:  04/27/21 225 lb (102.1 kg)  03/08/21 224 lb (101.6 kg)  02/14/21 223 lb 8 oz (101.4 kg)    Physical Exam Vitals reviewed.  HENT:     Head: Normocephalic and  atraumatic.  Eyes:     Pupils: Pupils are equal, round, and reactive to light.  Cardiovascular:     Rate and Rhythm: Normal rate and regular rhythm.     Heart sounds: Normal heart sounds.  Pulmonary:     Effort: Pulmonary effort is normal.     Breath sounds: Normal breath sounds.  Abdominal:     General: Bowel sounds are normal.     Palpations: Abdomen is soft.  Musculoskeletal:        General: No tenderness or deformity. Normal range of motion.     Cervical back: Normal range of motion.     Comments: He does have some swelling in the fingers of his right hand.  The main finger is his second finger.  This is quite swollen.  He does have some limited range of motion of his fingers.  It is hard for him to make a fist.  Lymphadenopathy:     Cervical: No cervical adenopathy.  Skin:    General: Skin is warm and dry.     Findings: No erythema or rash.  Neurological:     Mental Status: He is alert and oriented to person, place, and time.  Psychiatric:        Behavior: Behavior normal.        Thought Content: Thought content normal.        Judgment: Judgment normal.    Lab Results  Component Value Date   WBC 7.1 04/27/2021   HGB 12.4 (L) 04/27/2021   HCT 34.3 (L) 04/27/2021   MCV 108.5 (H) 04/27/2021   PLT 125 (L) 04/27/2021   Lab Results  Component Value Date   FERRITIN 374 (H) 02/14/2021   IRON 125 02/14/2021   TIBC 329 02/14/2021   UIBC 204 02/14/2021   IRONPCTSAT 38 02/14/2021    Lab Results  Component Value Date   RETICCTPCT 5.4 (H) 08/15/2020   RBC 3.16 (L) 04/27/2021   No results found for: KPAFRELGTCHN, LAMBDASER, KAPLAMBRATIO No results found for: IGGSERUM, IGA, IGMSERUM No results found for: Odetta Pink, SPEI   Chemistry      Component Value Date/Time   NA 141 04/27/2021 1110   NA 140 08/28/2019 0940   K 3.4 (L) 04/27/2021 1110   CL 104 04/27/2021 1110   CO2 28 04/27/2021 1110   BUN 21 04/27/2021 1110   BUN 21 08/28/2019 0940   CREATININE 0.98 04/27/2021 1110      Component Value Date/Time   CALCIUM 9.3 04/27/2021 1110   ALKPHOS 64 04/27/2021 1110   AST 15 04/27/2021 1110   ALT 16 04/27/2021 1110   BILITOT 1.7 (H) 04/27/2021 1110       Impression and Plan: John Douglas is a pleasant 64 yo caucasian gentleman with  history of CVA secondary to atrial fib and rectal bleeding. He was diagnosed with metastatic adenocarcinoma of the rectum-K-ras wild type/BRAF wild-type/HER-2 negative/MMR proficient. He had adenopathy distant to the rectum.   I really think that we have accomplished quite a bit with chemotherapy.  He has had chemotherapy along with chemotherapy and radiation therapy.  He still has some activity with his last PET scan in the rectal area.  Again, we will see what a PET scan shows.  I think this would be quite helpful.  I think the PET scan is still relatively normal, outside of the primary in the rectum, I think John Douglas would move to surgery.  I would wholly recommend  this.  We will get the PET scan set up in a couple weeks.  We will then get back with him and then figure out what we need to do.    Volanda Napoleon, MD 12/29/20224:29 PM

## 2021-05-09 ENCOUNTER — Other Ambulatory Visit (HOSPITAL_COMMUNITY): Payer: Self-pay

## 2021-05-10 ENCOUNTER — Other Ambulatory Visit: Payer: Self-pay | Admitting: Internal Medicine

## 2021-05-12 ENCOUNTER — Other Ambulatory Visit: Payer: Self-pay

## 2021-05-12 ENCOUNTER — Ambulatory Visit (HOSPITAL_COMMUNITY)
Admission: RE | Admit: 2021-05-12 | Discharge: 2021-05-12 | Disposition: A | Discharge status: Home / Self Care | Payer: Medicare HMO | Source: Ambulatory Visit | Attending: Hematology & Oncology | Admitting: Hematology & Oncology

## 2021-05-12 DIAGNOSIS — C20 Malignant neoplasm of rectum: Secondary | ICD-10-CM | POA: Insufficient documentation

## 2021-05-12 LAB — GLUCOSE, CAPILLARY: Glucose-Capillary: 128 mg/dL — ABNORMAL HIGH (ref 70–99)

## 2021-05-12 MED ORDER — FLUDEOXYGLUCOSE F - 18 (FDG) INJECTION
11.3000 | Freq: Once | INTRAVENOUS | Status: AC
  Administered 2021-05-12: 10.8 via INTRAVENOUS

## 2021-05-15 ENCOUNTER — Encounter: Payer: Self-pay | Admitting: *Deleted

## 2021-05-24 ENCOUNTER — Other Ambulatory Visit: Payer: Self-pay

## 2021-05-24 ENCOUNTER — Telehealth: Payer: Self-pay | Admitting: Internal Medicine

## 2021-05-24 MED ORDER — POTASSIUM CHLORIDE ER 20 MEQ PO TBCR: 2.0000 | EXTENDED_RELEASE_TABLET | Freq: Every day | ORAL | 0 refills | Status: DC

## 2021-05-24 NOTE — Telephone Encounter (Signed)
°*  STAT* If patient is at the pharmacy, call can be transferred to refill team.   1. Which medications need to be refilled? (please list name of each medication and dose if known) potassium 20 MEQ two times daily   2. Which pharmacy/location (including street and city if local pharmacy) is medication to be sent to? [facility] [place]  3. Do they need a 30 day or 90 day supply? 90 day

## 2021-05-24 NOTE — Telephone Encounter (Signed)
Potassium Chloride ER 20 MEQ TBCR 180 tablet 0 05/24/2021    Sig - Route: Take 2 tablets by mouth daily. - Oral    Pharmacy  [id number] - [place], [place] - [address] [facility] [address] [facility]

## 2021-06-06 ENCOUNTER — Telehealth: Payer: Self-pay | Admitting: Hematology & Oncology

## 2021-06-06 ENCOUNTER — Encounter: Payer: Self-pay | Admitting: Hematology & Oncology

## 2021-06-06 NOTE — Telephone Encounter (Signed)
Called to schedule port flush per 2/7 sch msg, left voicemail

## 2021-06-09 ENCOUNTER — Telehealth: Payer: Self-pay | Admitting: Hematology & Oncology

## 2021-06-09 NOTE — Telephone Encounter (Signed)
Called 2x to schedule port flush per schedule message, patient called and left voicemail 2/9, called patient back -no answer and left voicemail

## 2021-06-12 ENCOUNTER — Inpatient Hospital Stay: Payer: Medicare HMO | Attending: Hematology & Oncology

## 2021-06-12 ENCOUNTER — Inpatient Hospital Stay: Payer: Medicare HMO

## 2021-06-12 ENCOUNTER — Other Ambulatory Visit: Payer: Self-pay

## 2021-06-12 VITALS — BP 138/68 | HR 62 | Temp 97.8°F | Resp 20

## 2021-06-12 DIAGNOSIS — Z95828 Presence of other vascular implants and grafts: Secondary | ICD-10-CM

## 2021-06-12 DIAGNOSIS — C20 Malignant neoplasm of rectum: Secondary | ICD-10-CM

## 2021-06-12 LAB — CBC WITH DIFFERENTIAL/PLATELET
Abs Immature Granulocytes: 0.02 10*3/uL (ref 0.00–0.07)
Basophils Absolute: 0.1 10*3/uL (ref 0.0–0.1)
Basophils Relative: 1 %
Eosinophils Absolute: 0.1 10*3/uL (ref 0.0–0.5)
Eosinophils Relative: 2 %
HCT: 37.7 % — ABNORMAL LOW (ref 39.0–52.0)
Hemoglobin: 13.1 g/dL (ref 13.0–17.0)
Immature Granulocytes: 0 %
Lymphocytes Relative: 15 %
Lymphs Abs: 1 10*3/uL (ref 0.7–4.0)
MCH: 33.4 pg (ref 26.0–34.0)
MCHC: 34.7 g/dL (ref 30.0–36.0)
MCV: 96.2 fL (ref 80.0–100.0)
Monocytes Absolute: 0.5 10*3/uL (ref 0.1–1.0)
Monocytes Relative: 7 %
Neutro Abs: 5 10*3/uL (ref 1.7–7.7)
Neutrophils Relative %: 75 %
Platelets: 177 10*3/uL (ref 150–400)
RBC: 3.92 MIL/uL — ABNORMAL LOW (ref 4.22–5.81)
RDW: 14.1 % (ref 11.5–15.5)
WBC: 6.7 10*3/uL (ref 4.0–10.5)
nRBC: 0 % (ref 0.0–0.2)

## 2021-06-12 MED ORDER — SODIUM CHLORIDE 0.9% FLUSH
10.0000 mL | Freq: Once | INTRAVENOUS | Status: AC
  Administered 2021-06-12: 10 mL via INTRAVENOUS

## 2021-06-12 MED ORDER — HEPARIN SOD (PORK) LOCK FLUSH 100 UNIT/ML IV SOLN
500.0000 [IU] | Freq: Once | INTRAVENOUS | Status: AC
  Administered 2021-06-12: 500 [IU] via INTRAVENOUS

## 2021-06-12 NOTE — Patient Instructions (Signed)

## 2021-06-27 ENCOUNTER — Telehealth: Payer: Self-pay

## 2021-06-27 NOTE — Telephone Encounter (Signed)
Called pt. Pt is scheduled for an office with Dr. Bryan Lemma on 3/9 at 11:20 am. Pt verbalized understanding and had no other concerns at end of call.

## 2021-06-27 NOTE — Telephone Encounter (Signed)
-----   Message from [place], DO sent at 06/27/2021  1:02 PM EST ----- Regarding: FW: Flex sig Please set up OV with me.   Thanks!  ----- Message ----- From: Ileana Roup, MD Sent: 06/27/2021   9:24 AM EST To: Lavena Bullion, DO Subject: Flex sig                                       Hey Luanna Salk - this guy has not pursued surgery as of yet for a rectal cancer. He initially had concerns for systemic disease and underwent chemotherapy; ultimately followed by chemoXRT and he completed this over a year ago. He had a recent PET with Dr. Marin Olp and had no clear residual disease. Would you be able to see him soon for repeat flex sig? If he were to have residual cancer, he would favor surgery. Appreciate your help!  Gerald Stabs

## 2021-06-28 ENCOUNTER — Other Ambulatory Visit (HOSPITAL_COMMUNITY): Payer: Self-pay

## 2021-06-30 ENCOUNTER — Other Ambulatory Visit: Payer: Self-pay | Admitting: Internal Medicine

## 2021-06-30 DIAGNOSIS — I4892 Unspecified atrial flutter: Secondary | ICD-10-CM

## 2021-06-30 DIAGNOSIS — I4891 Unspecified atrial fibrillation: Secondary | ICD-10-CM

## 2021-07-06 ENCOUNTER — Other Ambulatory Visit: Payer: Self-pay

## 2021-07-06 ENCOUNTER — Ambulatory Visit: Payer: Medicare HMO | Admitting: Gastroenterology

## 2021-07-06 ENCOUNTER — Encounter: Payer: Self-pay | Admitting: Gastroenterology

## 2021-07-06 VITALS — BP 122/80 | HR 60 | Ht 70.0 in | Wt 223.0 lb

## 2021-07-06 DIAGNOSIS — Z7901 Long term (current) use of anticoagulants: Secondary | ICD-10-CM

## 2021-07-06 DIAGNOSIS — Z8673 Personal history of transient ischemic attack (TIA), and cerebral infarction without residual deficits: Secondary | ICD-10-CM

## 2021-07-06 DIAGNOSIS — Z79899 Other long term (current) drug therapy: Secondary | ICD-10-CM

## 2021-07-06 DIAGNOSIS — I480 Paroxysmal atrial fibrillation: Secondary | ICD-10-CM | POA: Diagnosis not present

## 2021-07-06 DIAGNOSIS — C189 Malignant neoplasm of colon, unspecified: Secondary | ICD-10-CM

## 2021-07-06 NOTE — Patient Instructions (Signed)
If you are age 68 or older, your body mass index should be between 23-30. Your Body mass index is 32 kg/m?Marland Kitchen If this is out of the aforementioned range listed, please consider follow up with your Primary Care Provider. ? ?If you are age 29 or younger, your body mass index should be between 19-25. Your Body mass index is 32 kg/m?Marland Kitchen If this is out of the aformentioned range listed, please consider follow up with your Primary Care Provider.  ? ?________________________________________________________ ? ?[facility] providers would like to encourage you to use [facility] to communicate with providers for non-urgent requests or questions.  Due to long hold times on the telephone, sending your provider a message by [facility] may be a faster and more efficient way to get a response.  Please allow 48 business hours for a response.  Please remember that this is for non-urgent requests.  ?_______________________________________________________ ? ?You have been scheduled for a flexible sigmoidoscopy. Please follow the written instructions given to you at your visit today. ?If you use inhalers (even only as needed), please bring them with you on the day of your procedure. ? ?Please call with any questions or concerns. ? ?It was a pleasure to see you today! ? ?Gerrit Heck, D.O. ? ?

## 2021-07-06 NOTE — Progress Notes (Signed)
? ?Chief Complaint:    Rectal cancer ? ?GI History: 66 year old male with a history of HTN, HLD, A-fib (on Xarelto), CVA (embolic stroke 2/2 holding anticoagulation for colonoscopy).  Was diagnosed with rectal cancer on colonoscopy in 09/2019. ? ?- 10/07/2019: Colonoscopy: fungating ulcerated partial obstructing mass in the proximal rectum, 7 cm from the anal verge. Traversed with ultraslim scope. Area was tattooed 3 cm proximal 1 cm distal to the mass. Biopsied. 2 other small polyps were removed from the colon. Rectal mass biopsy returned adenocarcinoma. The other polyps removed returned as hyperplastic polyps. ?- 10/10/2019: Pelvic MRI: rectal mass- [id number] involving the anterior peritoneal reflection. 4 cm from the internal anal sphincter ?- 10/10/2019: CT C/A/P:retroperitoneal and upper pelvic lymphadenopathy. Hyperenhancing focus in the left liver lobe. Circumferential distal esophageal thickening. ?- MRI Liver 10/12/19 -28m lesion in the left hepatic lobe favored to be hemangioma. Para-aortic lymph nodes measuring up to 1 cm in short axis suspicious for nodal metastases. ?- CT A/P 12/17/19 -moderate sigmoid diverticulitis. Mild decrease in abdominal and pelvic lymphadenopathy. No new or progressive metastatic disease. ?- CT CAP 02/23/20 -masslike mural thickening of the distal sigmoid colon and proximal rectum. Enlarged mesorectal lymph nodes as well as mildly enlarged pelvic and retroperitoneal nodes. Aortic atherosclerosis. Three-vessel CAD. ?- PET Scan 03/11/20 malignancy seen at the rectosigmoid junction. Borderline enlarged partially calcified left common iliac lymph node as well as aortocaval node both 1 cm in diameter. Metabolic activity is just above blood pool and surveillance was recommended. ?- Repeat PET 08/12/20 no change in appearance of the primary. Abdominal retroperitoneal, left, iliac, and retrocrural nodes demonstrate minimally decreased low-level hypermetabolism, below mediastinal blood pool.  Favored to be reactive. No new or progressive disease. ?- 05/12/2021: PET scan: No findings of residual or recurrent metabolically active tumor in the rectum.  Diffuse activity through the colon likely due to high blood sugar and could potentially discrete residual disease.  No abdominal/pelvic metastatic disease. ? ?Treatment: ?FOLFOX 10 cycles, started 11/17/19 ?XRT/Xeloda - started 04/01/20 - completed 04/27/20 ?5-FU/Avastin - maintenance - started 08/22/20 ? ?HPI:   ? ? ?Patient is a 66y.o. male presenting to the Gastroenterology Clinic for follow-up.  History of rectal CA diagnosed in 2021 treated with chemotherapy and XRT as outlined above. ? ?Was most recently seen by Dr. WDema Severinat Colorectal Surgery on 06/27/2021.  No evidence of metastatic disease on most recent PET/CT in 04/2021.  Was referred to the GI clinic today to discuss colonoscopy/flexible sigmoidoscopy to assess for residual cancer and further clarify decision on whether or not to pursue surgical resection.   ? ?He is o/w w/ any GI sxs ? ? ?Review of systems:     No chest pain, no SOB, no fevers, no urinary sx  ? ?Past Medical History:  ?Diagnosis Date  ? Allergic rhinitis   ? [facility]  ? Anxiety   ? Benign essential hypertension   ? [facility]  ? Coronary artery disease   ?  [facility]  ? Diabetic neuropathy, type II diabetes mellitus ([place]   ? [facility]  ? Dysrhythmia   ? Iron deficiency anemia due to chronic blood loss 11/18/2019  ? Mixed hyperlipidemia   ? [facility]  ? Myocardial infarct ([facility]   ? [facility]  ? Obesity, unspecified   ?  [facility]  ? Persistent atrial fibrillation ([facility]   ? Rectal bleeding   ? [facility]  ? 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 ([place]) 11/17/2019  ? Stroke [facility])   ? Wide-complex tachycardia 06/04/2019  ? ? ?Patient's surgical history,  family medical history, social history, medications and allergies were all reviewed in Epic  ? ? ?Current Outpatient Medications  ?Medication Sig Dispense Refill  ? diltiazem (CARDIZEM CD) 180 MG 24 hr capsule TAKE 1 CAPSULE BY MOUTH EVERY DAY 90 capsule 1  ? ELIQUIS 5 MG TABS tablet TAKE 1 TABLET BY MOUTH TWICE A DAY 60 tablet 5  ? ezetimibe (ZETIA) 10 MG tablet Take 1 tablet by mouth once daily 90 tablet 3  ? FLUoxetine (PROZAC) 20 MG capsule Take 20 mg by mouth every morning.    ? fluticasone (FLONASE) 50 MCG/ACT nasal spray Place 1 spray into both nostrils 2 (two) times daily as needed (sinus congestion). 18.2 mL 3  ? furosemide (LASIX) 40 MG tablet TAKE 1 TABLET BY MOUTH EVERY DAY 90 tablet 2  ? glimepiride (AMARYL) 1 MG tablet Take 1 mg by mouth daily.    ? glucose blood test strip Monitor BS before meals and at bedtime for a couple of weeks. Then may decrease to bid before meals 100 each 12  ? Lancets (ONETOUCH ULTRASOFT) lancets Use as instructed 100 each 12  ? metoprolol succinate (TOPROL-XL) 50 MG 24 hr tablet Take 1 tablet (50 mg total) by mouth daily. Take with or immediately following a meal. 90 tablet 3  ? nitroGLYCERIN (NITROSTAT) 0.4 MG SL tablet Place 1 tablet (0.4 mg total) under the tongue every 5 (five) minutes as needed for chest pain. 25 tablet PRN  ? Potassium Chloride ER 20 MEQ TBCR Take 2 tablets by mouth daily. 180 tablet 0  ? pramoxine-hydrocortisone (PROCTOCREAM-HC) 1-1 % rectal cream Place 1 application rectally 2 (two) times daily as needed for hemorrhoids or anal itching.    ? ?No current facility-administered medications for this visit.  ? ?Facility-Administered Medications Ordered in Other Visits  ?Medication Dose Route Frequency Provider Last Rate Last Admin  ? sodium chloride flush (NS) 0.9 % injection 10 mL  10 mL Intravenous PRN Celso Amy, NP   10 mL at 02/14/21 1054  ? ? ?Physical Exam:   ? ? ?BP 122/80   Pulse 60   Ht '5\' 10"'$  (1.778 m)   Wt 223 lb (101.2 kg)   SpO2 99%    BMI 32.00 kg/m?  ? ?GENERAL:  Pleasant male in NAD ?PSYCH: : Cooperative, normal affect ?Musculoskeletal:  Normal muscle tone, normal strength ?NEURO: Alert and oriented x 3, no focal neurologic deficits ? ? ?IMPRESSION and PLAN:   ? ?1) Rectal Adenocarcinoma ?Has completed chemotherapy and XRT without any residual disease or metastatic disease on most recent PET/CT in January.  Was evaluated in the Colorectal Surgery clinic last month to discuss surgical resection.  I discussed the role/utility of repeat flexible sigmoidoscopy vs colonoscopy with the patient and his daughter today.  He would strongly prefer flexible sigmoidoscopy. ? ?- Schedule flexible sigmoidoscopy to evaluate for residual adenocarcinoma ?- Clear liquid diet the day prior along with enema per protocol ?- Plan to continue Eliquis through the perioperative period given prior stroke in the setting of Plavix hold in the past ?- To follow-up with Dr. Dema Severin in the Colorectal Surgery clinic after flexible sigmoidoscopy ?- Follow-up with Dr. Marin Olp in the Hematology/Oncology clinic ? ?2) Atrial fibrillation ?3) Systemic anticoagulation ?4) History of CVA ?5) CAD ?- As above, plan to continue Eliquis through  the perioperative period.  Still able to perform mucosal biopsies, but would avoid large polypectomy ? ?The indications, risks, and benefits of flexible sigmoidoscopy were explained to the patient in detail. Risks include but are not limited to bleeding, perforation, adverse reaction to medications, and cardiopulmonary compromise. Sequelae include but are not limited to the possibility of surgery, hospitalization, and mortality. The patient verbalized understanding and wished to proceed. All questions answered.  Further recommendations pending results of the exam.  ? ? ?    ?    ? ?Lavena Bullion ,DO, FACG 07/06/2021, 11:40 AM ? ?

## 2021-07-16 ENCOUNTER — Telehealth: Payer: Self-pay | Admitting: Physician Assistant

## 2021-07-16 NOTE — Telephone Encounter (Signed)
Telephone Call ? ?Patient called saying that he was supposed to take Mag Citrate prior to sigmoidoscopy but they dont make it anymore.  He was wondering if he needed something to substitute- reassured him that as long as he is not constipated (he has had 2-3 BM already this morning) and stays on clear diet today and does enemas in the morning it should be fine. ? ?He verbalized understanding and thanked me for the call. ? ?[name], Pa-C ?

## 2021-07-17 ENCOUNTER — Ambulatory Visit (AMBULATORY_SURGERY_CENTER): Payer: Medicare HMO | Admitting: Gastroenterology

## 2021-07-17 ENCOUNTER — Encounter: Payer: Self-pay | Admitting: Gastroenterology

## 2021-07-17 ENCOUNTER — Other Ambulatory Visit: Payer: Self-pay | Admitting: Gastroenterology

## 2021-07-17 VITALS — BP 130/80 | HR 54 | Temp 96.8°F | Resp 15 | Ht 70.0 in | Wt 223.0 lb

## 2021-07-17 DIAGNOSIS — R194 Change in bowel habit: Secondary | ICD-10-CM

## 2021-07-17 DIAGNOSIS — K621 Rectal polyp: Secondary | ICD-10-CM

## 2021-07-17 DIAGNOSIS — C189 Malignant neoplasm of colon, unspecified: Secondary | ICD-10-CM

## 2021-07-17 DIAGNOSIS — K624 Stenosis of anus and rectum: Secondary | ICD-10-CM

## 2021-07-17 DIAGNOSIS — K641 Second degree hemorrhoids: Secondary | ICD-10-CM

## 2021-07-17 DIAGNOSIS — K625 Hemorrhage of anus and rectum: Secondary | ICD-10-CM

## 2021-07-17 DIAGNOSIS — D128 Benign neoplasm of rectum: Secondary | ICD-10-CM

## 2021-07-17 DIAGNOSIS — Z85048 Personal history of other malignant neoplasm of rectum, rectosigmoid junction, and anus: Secondary | ICD-10-CM | POA: Diagnosis not present

## 2021-07-17 DIAGNOSIS — K623 Rectal prolapse: Secondary | ICD-10-CM | POA: Diagnosis not present

## 2021-07-17 DIAGNOSIS — K6289 Other specified diseases of anus and rectum: Secondary | ICD-10-CM

## 2021-07-17 MED ORDER — SODIUM CHLORIDE 0.9 % IV SOLN: 500.0000 mL | INTRAVENOUS | Status: DC

## 2021-07-17 NOTE — Progress Notes (Signed)
Pt's states no medical or surgical changes since previsit or office visit. 

## 2021-07-17 NOTE — Patient Instructions (Signed)
Discharge instructions given. ?Handouts on polyps and hemorrhoids. ?Resume previous medications. ?Follow up with Dr. Dema Severin in the [facility]. ?YOU HAD AN ENDOSCOPIC PROCEDURE TODAY AT [facility]:   Refer to the procedure report that was given to you for any specific questions about what was found during the examination.  If the procedure report does not answer your questions, please call your gastroenterologist to clarify.  If you requested that your care partner not be given the details of your procedure findings, then the procedure report has been included in a sealed envelope for you to review at your convenience later. ? ?YOU SHOULD EXPECT: Some feelings of bloating in the abdomen. Passage of more gas than usual.  Walking can help get rid of the air that was put into your GI tract during the procedure and reduce the bloating. If you had a lower endoscopy (such as a colonoscopy or flexible sigmoidoscopy) you may notice spotting of blood in your stool or on the toilet paper. If you underwent a bowel prep for your procedure, you may not have a normal bowel movement for a few days. ? ?Please Note:  You might notice some irritation and congestion in your nose or some drainage.  This is from the oxygen used during your procedure.  There is no need for concern and it should clear up in a day or so. ? ?SYMPTOMS TO REPORT IMMEDIATELY: ? ?Following lower endoscopy (colonoscopy or flexible sigmoidoscopy): ? Excessive amounts of blood in the stool ? Significant tenderness or worsening of abdominal pains ? Swelling of the abdomen that is new, acute ? Fever of 100?F or higher ? ? ?For urgent or emergent issues, a gastroenterologist can be reached at any hour by calling [phone]. ?Do not use MyChart messaging for urgent concerns.  ? ? ?DIET:  We do recommend a small meal at first, but then you may proceed to your regular diet.  Drink plenty of fluids but you should avoid alcoholic  beverages for 24 hours. ? ?ACTIVITY:  You should plan to take it easy for the rest of today and you should NOT DRIVE or use heavy machinery until tomorrow (because of the sedation medicines used during the test).   ? ?FOLLOW UP: ?Our staff will call the number listed on your records 48-72 hours following your procedure to check on you and address any questions or concerns that you may have regarding the information given to you following your procedure. If we do not reach you, we will leave a message.  We will attempt to reach you two times.  During this call, we will ask if you have developed any symptoms of COVID 19. If you develop any symptoms (ie: fever, flu-like symptoms, shortness of breath, cough etc.) before then, please call [phone].  If you test positive for Covid 19 in the 2 weeks post procedure, please call and report this information to [place].   ? ?If any biopsies were taken you will be contacted by phone or by letter within the next 1-3 weeks.  Please call us at [phone] if you have not heard about the biopsies in 3 weeks.  ? ? ?SIGNATURES/CONFIDENTIALITY: ?You and/or your care partner have signed paperwork which will be entered into your electronic medical record.  These signatures attest to the fact that that the information above on your After Visit Summary has been reviewed and is understood.  Full responsibility of the confidentiality of this discharge information lies with you and/or  your care-partner.  ?

## 2021-07-17 NOTE — Progress Notes (Signed)
Called to room to assist during endoscopic procedure.  Patient ID and intended procedure confirmed with present staff. Received instructions for my participation in the procedure from the performing physician.  

## 2021-07-17 NOTE — Op Note (Signed)
[facility] ?Patient Name: John Douglas ?Procedure Date: 07/17/2021 3:00 PM ?MRN: [id number] ?Endoscopist: Gerrit Heck , MD ?Age: 66 ?Referring MD:  ?Date of Birth: 02-Apr-1956 ?Gender: Male ?Account #: [id number] ?Procedure:                Flexible Sigmoidoscopy ?Indications:              Personal history of malignant neoplasm of the colon ?                          66 yo male with history of rectal adenocarcinoma  ?                          diagnosed on index colonoscopy in 09/2019. He has  ?                          since completed chemotherapy and radiation, and  ?                          most recent imaging negative for residual disease.  ?                          He presents today to evaluate. ?Medicines:                Monitored Anesthesia Care ?Procedure:                Pre-Anesthesia Assessment: ?                          - Prior to the procedure, a History and Physical  ?                          was performed, and patient medications and  ?                          allergies were reviewed. The patient's tolerance of  ?                          previous anesthesia was also reviewed. The risks  ?                          and benefits of the procedure and the sedation  ?                          options and risks were discussed with the patient.  ?                          All questions were answered, and informed consent  ?                          was obtained. Prior Anticoagulants: The patient has  ?                          taken Eliquis (apixaban), last dose was day of  ?  procedure. ASA Grade Assessment: III - A patient  ?                          with severe systemic disease. After reviewing the  ?                          risks and benefits, the patient was deemed in  ?                          satisfactory condition to undergo the procedure. ?                          After obtaining informed consent, the scope was  ?                          passed under  direct vision. The 0441 [id number]-[id number]  ?                          Slim SB Colonoscope was introduced through the anus  ?                          and advanced to the the rectum. The flexible  ?                          sigmoidoscopy was technically difficult and complex  ?                          due to bowel stenosis. Attempted to traverse by  ?                          withdrawing the scope and replacing with the  ?                          Ultraslim scope, but still unable to traverse. The  ?                          quality of the bowel preparation was adequate. ?Scope In: ?Scope Out: ?Findings:                 Skin tags were found on perianal exam. ?                          A severe stenosis measuring 5 mm (inner diameter)  ?                          was found in the rectum and was non-traversed. This  ?                          was located approximately 6-7 cm from the anal  ?                          verge. The pediatric colonoscope was withdrawn and  ?  replaced with the Ultraslim scope, but this was  ?                          still not traversable. Able to achieve limited  ?                          views of the mucosa proximal to the stricture,  ?                          which was suspicious in appearance for at least  ?                          residual adenoma. Biopsies were taken with a cold  ?                          forceps for histology. Estimated blood loss was  ?                          minimal. ?                          A tattoo was seen in the rectum. ?                          A 3 mm polyp was found in the distal rectum. The  ?                          polyp was sessile. The polyp was removed with a  ?                          cold snare. Resection and retrieval were complete.  ?                          Estimated blood loss was minimal. ?                          Non-bleeding internal hemorrhoids were found during  ?                          retroflexion. The  hemorrhoids were small and Grade  ?                          II (internal hemorrhoids that prolapse but reduce  ?                          spontaneously). ?Complications:            No immediate complications. ?Estimated Blood Loss:     Estimated blood loss was minimal. ?Impression:               - Perianal skin tags found on perianal exam. ?                          - Severe, non-traversable stricture in the rectum.  ?  Biopsied. ?                          - A tattoo was seen in the rectum. ?                          - One 3 mm polyp in the distal rectum, removed with  ?                          a cold snare. Resected and retrieved. ?                          - Non-bleeding internal hemorrhoids. ?Recommendation:           - Discharge patient to home (with escort). ?                          - Resume previous diet today. ?                          - Continue present medications. ?                          - Await pathology results. ?                          - Follow-up with Dr. Dema Severin in the [facility]                          [facility]. ?Gerrit Heck, MD ?07/17/2021 3:47:27 PM ?

## 2021-07-17 NOTE — Progress Notes (Signed)
? ?GASTROENTEROLOGY PROCEDURE H&P NOTE  ? ?Primary Care Physician: ?Leonides Sake, MD ? ? ? ?Reason for Procedure:   Rectal CA surveillance  ? ?Plan:    Flexible sigmoidoscopy ? ?Patient is appropriate for endoscopic procedure(s) in the ambulatory ([place]) setting. ? ?The nature of the procedure, as well as the risks, benefits, and alternatives were carefully and thoroughly reviewed with the patient. Ample time for discussion and questions allowed. The patient understood, was satisfied, and agreed to proceed.  ? ? ? ?HPI: ?John Douglas is a 66 y.o. male who presents for Flex sigmoidoscopy for rectal CA surveillance.  ? ?Was most recently seen by Dr. Dema Severin at [facility] on 06/27/2021.  No evidence of metastatic disease on most recent PET/CT in 04/2021.  Was referred to the GI clinic on 07/06/2021 to discuss flexible sigmoidoscopy to assess for residual cancer and further clarify decision on whether or not to pursue surgical resection.   ?  ?He is o/w w/ any GI sxs ? ?Past Medical History:  ?Diagnosis Date  ? Allergic rhinitis   ? [facility]  ? Anxiety   ? Benign essential hypertension   ? [facility]  ? Coronary artery disease   ? [facility]  ? Diabetic neuropathy, type II diabetes mellitus ([place])   ? [facility]  ? Dysrhythmia   ? Iron deficiency anemia due to chronic blood loss 11/18/2019  ? Mixed hyperlipidemia   ? [facility]  ? Myocardial infarct [facility])   ? [facility]  ? Obesity, unspecified   ? [facility]  ? Persistent atrial fibrillation ([place])   ? Rectal bleeding   ? [facility]  ? 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 Stroke [facility])   ? Wide-complex tachycardia 06/04/2019  ? ? ?Past Surgical History:  ?Procedure Laterality Date  ? BIOPSY  10/07/2019  ? Procedure: BIOPSY;  Surgeon: Lavena Bullion, DO;  Location:  [facility];  Service: Gastroenterology;;  ? COLONOSCOPY WITH PROPOFOL N/A 10/07/2019  ? Procedure: COLONOSCOPY WITH PROPOFOL;  Surgeon: Lavena Bullion, DO;  Location: [facility];  Service: Gastroenterology;  Laterality: N/A;  ? CORONARY ANGIOPLASTY    ? 3 stents  ? IR IMAGING GUIDED PORT INSERTION  11/24/2019  ? LEFT HEART CATH AND CORONARY ANGIOGRAPHY N/A 06/05/2019  ? Procedure: LEFT HEART CATH AND CORONARY ANGIOGRAPHY;  Surgeon: Nelva Bush, MD;  Location: [facility];  Service: Cardiovascular;  Laterality: N/A;  ? POLYPECTOMY  10/07/2019  ? Procedure: POLYPECTOMY;  Surgeon: Lavena Bullion, DO;  Location: [facility];  Service: Gastroenterology;;  ? SUBMUCOSAL TATTOO INJECTION  10/07/2019  ? Procedure: SUBMUCOSAL TATTOO INJECTION;  Surgeon: Lavena Bullion, DO;  Location: [facility];  Service: Gastroenterology;;  ? ? ?Prior to Admission medications   ?Medication Sig Start Date End Date Taking? Authorizing Provider  ?diltiazem (CARDIZEM CD) 180 MG 24 hr capsule TAKE 1 CAPSULE BY MOUTH EVERY DAY 06/30/21  Yes End, Harrell Gave, MD  ?ELIQUIS 5 MG TABS tablet TAKE 1 TABLET BY MOUTH TWICE A DAY 04/12/21  Yes Ennever, Rudell Cobb, MD  ?ezetimibe (ZETIA) 10 MG tablet Take 1 tablet by mouth once daily 06/20/20  Yes End, Harrell Gave, MD  ?FLUoxetine (PROZAC) 20 MG capsule Take 20 mg by mouth every morning. 01/28/21  Yes [provider]  ?furosemide (LASIX) 40 MG tablet TAKE 1 TABLET BY MOUTH EVERY DAY 04/07/21  Yes End, Harrell Gave, MD  ?glimepiride (AMARYL) 1 MG tablet Take 1 mg by mouth daily. 07/31/20  Yes [provider]  ?glucose blood test strip Monitor BS before meals and at bedtime for a couple of weeks. Then may decrease to bid before meals 10/23/19  Yes Love, Ivan Anchors, PA-C  ?Lancets (ONETOUCH ULTRASOFT) lancets Use as instructed 10/23/19  Yes Love, Ivan Anchors, PA-C  ?metoprolol succinate (TOPROL-XL) 50 MG 24 hr tablet Take 1 tablet (50 mg total) by mouth daily. Take with or immediately  following a meal. 12/14/20  Yes End, Harrell Gave, MD  ?Potassium Chloride ER 20 MEQ TBCR Take 2 tablets by mouth daily. 05/24/21  Yes End, Harrell Gave, MD  ?fluticasone (FLONASE) 50 MCG/ACT nasal spray Place 1 spray into both nostrils 2 (two) times daily as needed (sinus congestion). 12/14/20   End, Harrell Gave, MD  ?nitroGLYCERIN (NITROSTAT) 0.4 MG SL tablet Place 1 tablet (0.4 mg total) under the tongue every 5 (five) minutes as needed for chest pain. 12/14/20   End, Harrell Gave, MD  ?pramoxine-hydrocortisone (PROCTOCREAM-HC) 1-1 % rectal cream Place 1 application rectally 2 (two) times daily as needed for hemorrhoids or anal itching.    [provider]  ? ? ?Current Outpatient Medications  ?Medication Sig Dispense Refill  ? diltiazem (CARDIZEM CD) 180 MG 24 hr capsule TAKE 1 CAPSULE BY MOUTH EVERY DAY 90 capsule 1  ? ELIQUIS 5 MG TABS tablet TAKE 1 TABLET BY MOUTH TWICE A DAY 60 tablet 5  ? ezetimibe (ZETIA) 10 MG tablet Take 1 tablet by mouth once daily 90 tablet 3  ? FLUoxetine (PROZAC) 20 MG capsule Take 20 mg by mouth every morning.    ? furosemide (LASIX) 40 MG tablet TAKE 1 TABLET BY MOUTH EVERY DAY 90 tablet 2  ? glimepiride (AMARYL) 1 MG tablet Take 1 mg by mouth daily.    ? glucose blood test strip Monitor BS before meals and at bedtime for a couple of weeks. Then may decrease to bid before meals 100 each 12  ? Lancets (ONETOUCH ULTRASOFT) lancets Use as instructed 100 each 12  ? metoprolol succinate (TOPROL-XL) 50 MG 24 hr tablet Take 1 tablet (50 mg total) by mouth daily. Take with or immediately following a meal. 90 tablet 3  ? Potassium Chloride ER 20 MEQ TBCR Take 2 tablets by mouth daily. 180 tablet 0  ? fluticasone (FLONASE) 50 MCG/ACT nasal spray Place 1 spray into both nostrils 2 (two) times daily as needed (sinus congestion). 18.2 mL 3  ? nitroGLYCERIN (NITROSTAT) 0.4 MG SL tablet Place 1 tablet (0.4 mg total) under the tongue every 5 (five) minutes as needed for chest pain. 25 tablet PRN   ? pramoxine-hydrocortisone (PROCTOCREAM-HC) 1-1 % rectal cream Place 1 application rectally 2 (two) times daily as needed for hemorrhoids or anal itching.    ? ?Current Facility-Administered Medications  ?Medication Dose Route Frequency Provider Last Rate Last Admin  ? 0.9 %  sodium chloride infusion  500 mL Intravenous Continuous Ericah Scotto V, DO      ? ?Facility-Administered Medications Ordered in Other Visits  ?Medication Dose Route Frequency Provider Last Rate Last Admin  ? sodium chloride flush (NS) 0.9 % injection 10 mL  10 mL Intravenous PRN Celso Amy, NP   10 mL at 02/14/21 1054  ? ? ?Allergies as of 07/17/2021 - Review Complete 07/17/2021  ?Allergen Reaction Noted  ? Celexa [citalopram] Other (See Comments) 12/16/2019  ? Ibuprofen Other (See Comments) 06/04/2019  ? Metformin and  related Other (See Comments) 10/02/2019  ? Naproxen Other (See Comments) 06/04/2019  ? Yellow jacket venom [bee venom] Swelling and Other (See Comments) 06/04/2019  ? Penicillins Hives 06/04/2019  ? ? ?Family History  ?Problem Relation Age of Onset  ? Diverticulitis Mother   ? Hypertension Father   ? Heart disease Father   ?     MI  ? Prostate cancer Father   ? Colon cancer Neg Hx   ? Stomach cancer Neg Hx   ? Pancreatic cancer Neg Hx   ? Rectal cancer Neg Hx   ? ? ?Social History  ? ?Socioeconomic History  ? Marital status: Divorced  ?  Spouse name: Not on file  ? Number of children: 2  ? Years of education: Not on file  ? Highest education level: Not on file  ?Occupational History  ? Occupation: retired  ?Tobacco Use  ? Smoking status: Never  ? Smokeless tobacco: Former  ?  Types: Chew  ?  Quit date: 05/04/2019  ?Vaping Use  ? Vaping Use: Never used  ?Substance and Sexual Activity  ? Alcohol use: Not Currently  ?  Comment: occasional  ? Drug use: Yes  ?  Types: Marijuana  ?  Comment: occassionally  ? Sexual activity: Not Currently  ?Other Topics Concern  ? Not on file  ?Social History Narrative  ? Divorced with 2 adult  children  ? 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 No tobacco  ? + marijuana to treat anxiety  ? occ EtOH  ? ?Social Determinants of Health  ? ?Financial Resource Strain: Not on

## 2021-07-19 ENCOUNTER — Telehealth: Payer: Self-pay | Admitting: *Deleted

## 2021-07-19 ENCOUNTER — Telehealth: Payer: Self-pay

## 2021-07-19 NOTE — Telephone Encounter (Signed)
?  Follow up Call- ? ? ?  07/17/2021  ?  2:25 PM  ?Call back number  ?Post procedure Call Back phone  # [phone]  ?Permission to leave phone message Yes  ?  ? ?Patient questions: ? ?Do you have a fever, pain , or abdominal swelling? No. ?Pain Score  0 * ? ?Have you tolerated food without any problems? Yes.   ? ?Have you been able to return to your normal activities? Yes.   ? ?Do you have any questions about your discharge instructions: ?Diet   No. ?Medications  No. ?Follow up visit  No. ? ?Do you have questions or concerns about your Care? No. ? ?Actions: ?* If pain score is 4 or above: ?No action needed, pain <4. ? ? ?

## 2021-07-19 NOTE — Telephone Encounter (Signed)
No answer, left message to call back later today, B.Solita Macadam RN. 

## 2021-07-20 ENCOUNTER — Encounter: Payer: Self-pay | Admitting: Hematology & Oncology

## 2021-07-20 ENCOUNTER — Other Ambulatory Visit: Payer: Medicare HMO | Admitting: Gastroenterology

## 2021-07-22 IMAGING — PT NM PET TUM IMG RESTAG (PS) SKULL BASE T - THIGH
7 series · 25 of 25 positions shown · non-contrast
Comparison: 03/11/2020.

CLINICAL DATA: Subsequent treatment strategy for colorectal cancer.
Evaluate treatment response to metastatic rectal cancer. History of
radiation therapy last year. Currently on chemotherapy.

EXAM:
NUCLEAR MEDICINE PET SKULL BASE TO THIGH
TECHNIQUE: 11.1 mCi F-18 FDG was injected intravenously. Full-ring PET imaging
was performed from the skull base to thigh after the radiotracer. CT
data was obtained and used for attenuation correction and anatomic
localization.
Fasting blood glucose: 162 mg/dl

[Series 3: pet sk_thigh ac · axial · 5.0mm · 4.07mm/px · z∈[-995,-27]mm · 5 of 243 slices shown]
[im 1/243]
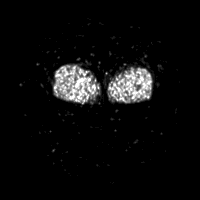
[im 61/243]
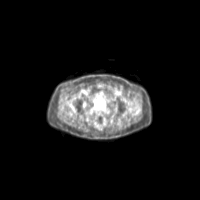
[im 122/243]
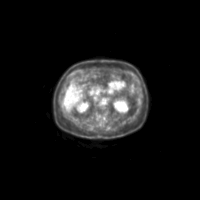
[im 182/243]
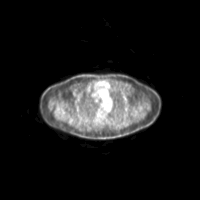
[im 243/243]
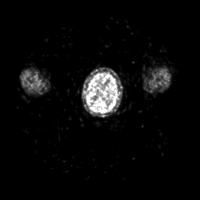

[Series 4: ct sk_thigh 5.0 bf37 · axial · 5.0mm · 0.98mm/px · z∈[-995,-27]mm · 6 of 243 slices shown]
[im 1/243]
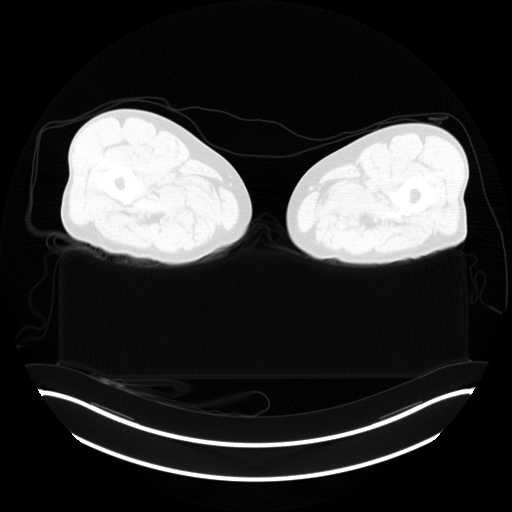
[im 49/243]
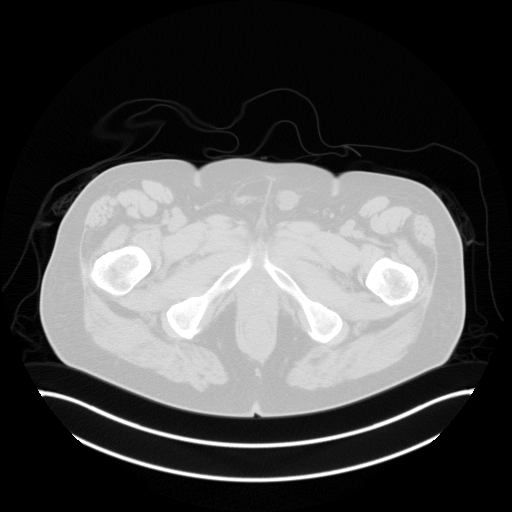
[im 97/243]
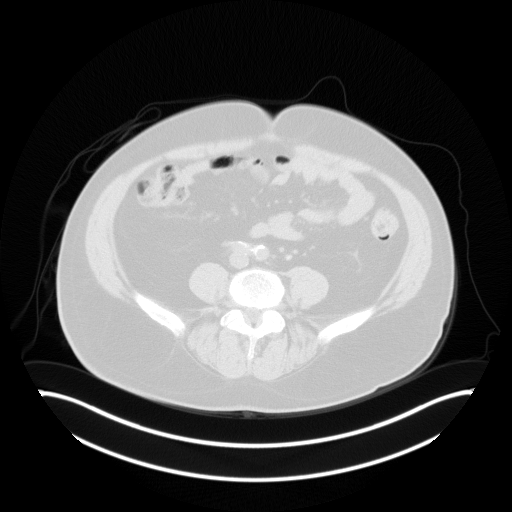
[im 146/243]
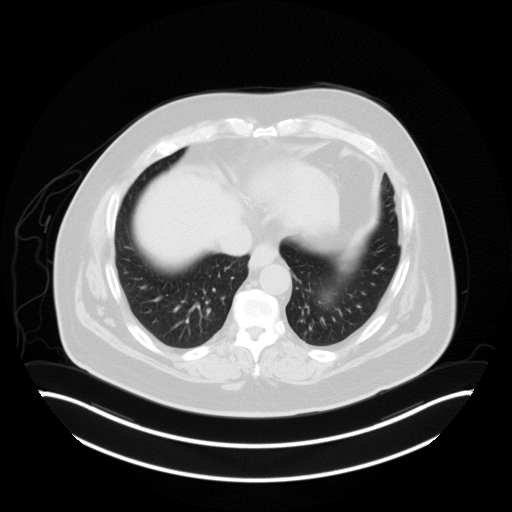
[im 194/243]
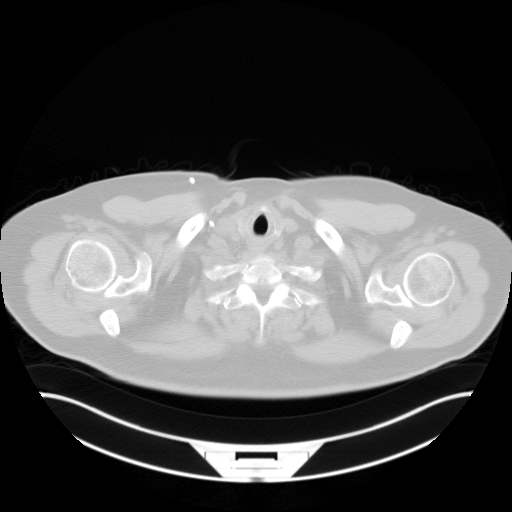
[im 243/243  brain]
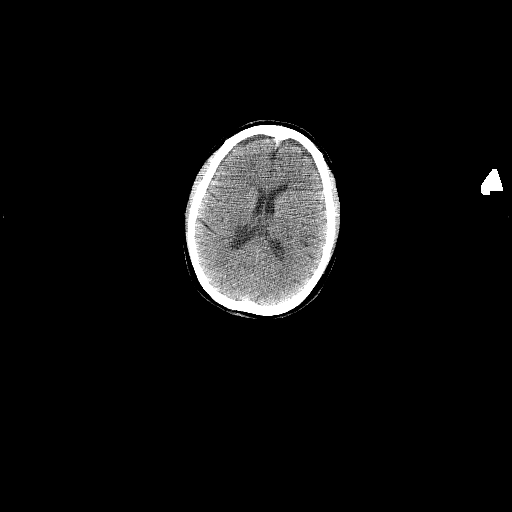

[Series 5: pet sk_thigh nac · axial · 5.0mm · 4.07mm/px · z∈[-995,-27]mm · 6 of 243 slices shown]
[im 1/243]
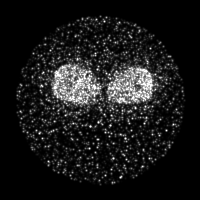
[im 49/243]
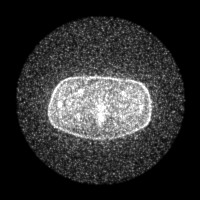
[im 97/243]
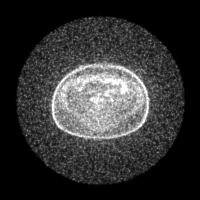
[im 146/243]
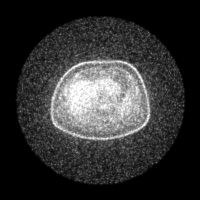
[im 194/243]
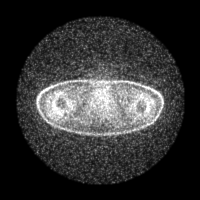
[im 243/243]
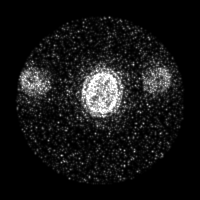

[Series 8: ct sk_thigh 5.0 br59 lung_bone · axial · 5.0mm · 0.66mm/px · 1 of 62 slices shown]
[im 1/62  brain]
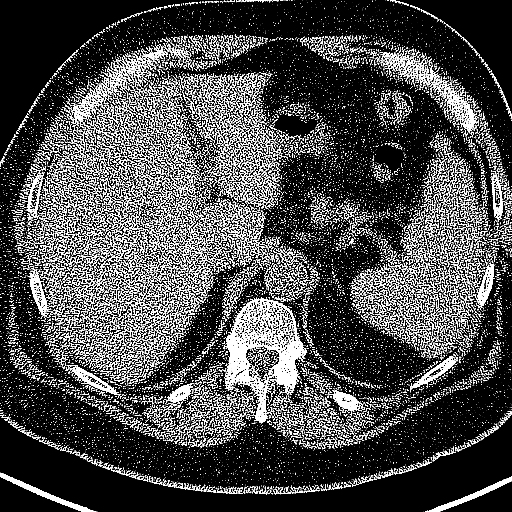

[Series 603: <mip collection> · coronal · 2.01mm/px · 1 of 32 slices shown]
[im 1/32]
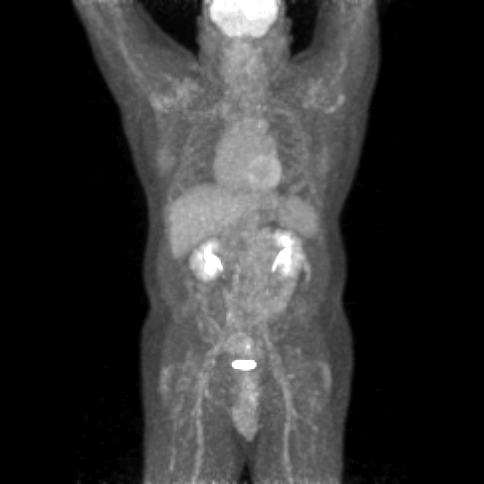

[Series 604: fused cor · 1 of 41 slices shown]
[im 1/41]
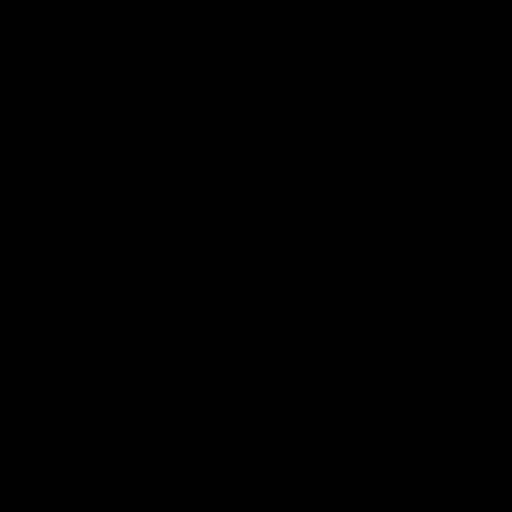

[Series 605: range-ct sk_thigh 5.0 bf37-tra-<alpha range> · 5 of 237 slices shown]
[im 1/237]
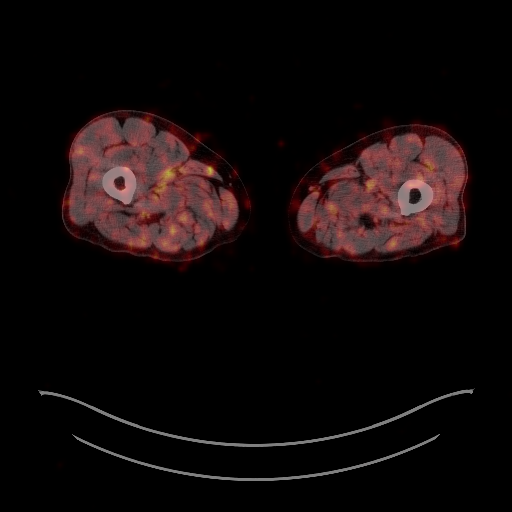
[im 60/237]
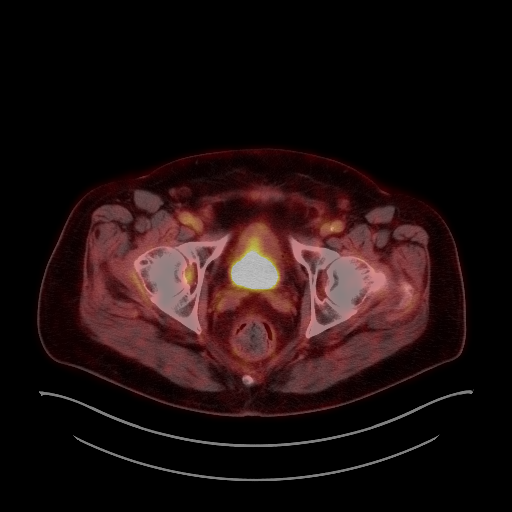
[im 119/237]
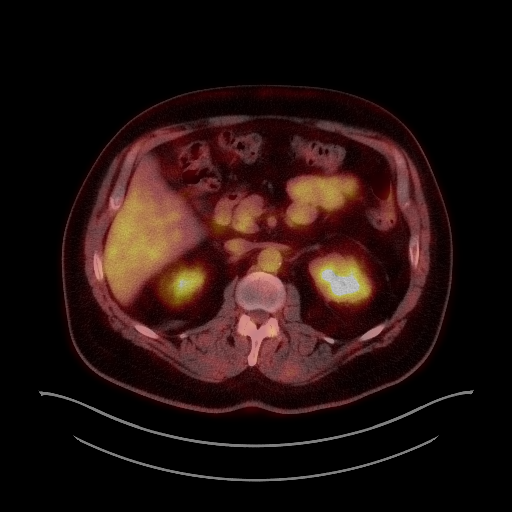
[im 178/237]
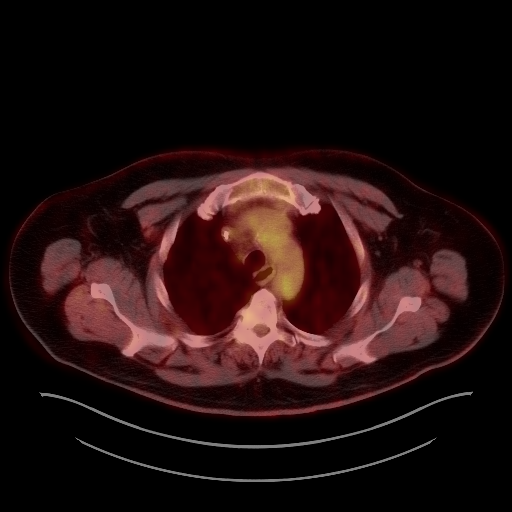
[im 237/237]
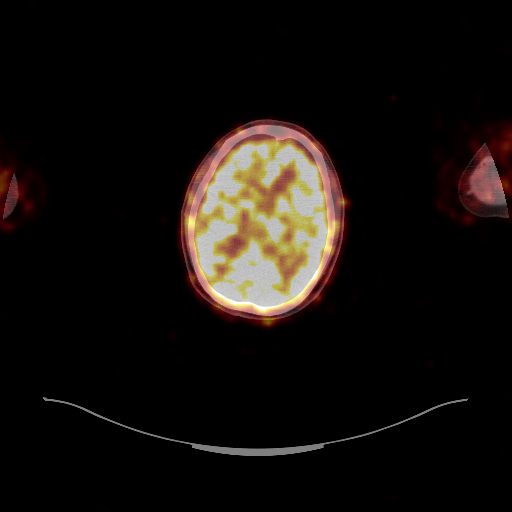

[25 of 25 positions shown; findings below may reference images not displayed]

FINDINGS: Mediastinal blood pool activity: SUV max

Liver activity: SUV max NA

NECK: No areas of abnormal hypermetabolism.

Incidental CT findings: Bilateral carotid atherosclerosis. No
cervical adenopathy.

CHEST: No pulmonary parenchymal or thoracic nodal hypermetabolism. A
right retrocrural node measures 6 mm and a S.U.V. max of 2.5 versus
8 mm and a S.U.V. max of 2.6 on the prior.

Incidental CT findings: Right Port-A-Cath tip at high right atrium.
Aortic and dense coronary artery calcifications. Resolution of
bilateral pleural effusions.

ABDOMEN/PELVIS: The rectosigmoid wall thickening persists with
hypermetabolism of a S.U.V. max of 7.1 including on 173/4.
Relatively similar in CT appearance and a S.U.V. max of 6.8 on the
prior exam.

Aortocaval node measures 8 mm and a S.U.V. max of 2.2 on 133/4.
Compare similar in size and a S.U.V. max of 2.6 on the prior exam.

Calcified left common iliac node measures 9 mm and a S.U.V. max of
2.7 on 155/4. Compare 10 mm and a S.U.V. max of 2.8 on the prior
exam.

Incidental CT findings: Abdominal aortic atherosclerosis. Normal
adrenal glands. Interpolar right renal 3 mm collecting system
calculus. Fat containing right inguinal hernia.

SKELETON: No abnormal marrow activity.

Incidental CT findings: No acute osseous abnormality.
IMPRESSION: 1. No change in appearance of the primary since 03/11/2020. Similar
rectosigmoid wall thickening and hypermetabolism, which could be
treatment related or represent residual disease.
2. Abdominal retroperitoneal, left common iliac, and retrocrural
nodes which are not pathologic by size criteria and demonstrate
minimally decreased low-level hypermetabolism, below mediastinal
blood pool. Favored to be reactive versus less likely treated nodal
metastasis.
3. No new or progressive disease.
4. Incidental findings, including: Coronary artery atherosclerosis.
Aortic Atherosclerosis ([id number]). Right nephrolithiasis.
5. Resolved bilateral pleural effusions

## 2021-08-04 ENCOUNTER — Inpatient Hospital Stay: Payer: Medicare HMO | Admitting: Hematology & Oncology

## 2021-08-04 ENCOUNTER — Other Ambulatory Visit: Payer: Self-pay | Admitting: *Deleted

## 2021-08-04 ENCOUNTER — Encounter: Payer: Self-pay | Admitting: Hematology & Oncology

## 2021-08-04 ENCOUNTER — Other Ambulatory Visit: Payer: Self-pay

## 2021-08-04 ENCOUNTER — Inpatient Hospital Stay: Payer: Medicare HMO | Attending: Hematology & Oncology

## 2021-08-04 ENCOUNTER — Inpatient Hospital Stay: Payer: Medicare HMO

## 2021-08-04 VITALS — BP 124/75 | HR 56 | Temp 98.7°F | Resp 18 | Ht 70.0 in | Wt 217.0 lb

## 2021-08-04 DIAGNOSIS — Z7901 Long term (current) use of anticoagulants: Secondary | ICD-10-CM | POA: Insufficient documentation

## 2021-08-04 DIAGNOSIS — Z95828 Presence of other vascular implants and grafts: Secondary | ICD-10-CM

## 2021-08-04 DIAGNOSIS — E119 Type 2 diabetes mellitus without complications: Secondary | ICD-10-CM | POA: Insufficient documentation

## 2021-08-04 DIAGNOSIS — C20 Malignant neoplasm of rectum: Secondary | ICD-10-CM

## 2021-08-04 DIAGNOSIS — I4891 Unspecified atrial fibrillation: Secondary | ICD-10-CM | POA: Insufficient documentation

## 2021-08-04 DIAGNOSIS — Z452 Encounter for adjustment and management of vascular access device: Secondary | ICD-10-CM | POA: Insufficient documentation

## 2021-08-04 LAB — CBC WITH DIFFERENTIAL/PLATELET
Abs Immature Granulocytes: 0.04 10*3/uL (ref 0.00–0.07)
Basophils Absolute: 0.1 10*3/uL (ref 0.0–0.1)
Basophils Relative: 1 %
Eosinophils Absolute: 0.2 10*3/uL (ref 0.0–0.5)
Eosinophils Relative: 3 %
HCT: 40 % (ref 39.0–52.0)
Hemoglobin: 13.7 g/dL (ref 13.0–17.0)
Immature Granulocytes: 1 %
Lymphocytes Relative: 19 %
Lymphs Abs: 1.5 10*3/uL (ref 0.7–4.0)
MCH: 31.3 pg (ref 26.0–34.0)
MCHC: 34.3 g/dL (ref 30.0–36.0)
MCV: 91.3 fL (ref 80.0–100.0)
Monocytes Absolute: 0.5 10*3/uL (ref 0.1–1.0)
Monocytes Relative: 7 %
Neutro Abs: 5.5 10*3/uL (ref 1.7–7.7)
Neutrophils Relative %: 69 %
Platelets: 219 10*3/uL (ref 150–400)
RBC: 4.38 MIL/uL (ref 4.22–5.81)
RDW: 15.2 % (ref 11.5–15.5)
WBC: 7.9 10*3/uL (ref 4.0–10.5)
nRBC: 0 % (ref 0.0–0.2)

## 2021-08-04 LAB — CMP (CANCER CENTER ONLY)
ALT: 11 U/L (ref 0–44)
AST: 11 U/L — ABNORMAL LOW (ref 15–41)
Albumin: 4.5 g/dL (ref 3.5–5.0)
Alkaline Phosphatase: 56 U/L (ref 38–126)
Anion gap: 6 (ref 5–15)
BUN: 27 mg/dL — ABNORMAL HIGH (ref 8–23)
CO2: 29 mmol/L (ref 22–32)
Calcium: 9.7 mg/dL (ref 8.9–10.3)
Chloride: 106 mmol/L (ref 98–111)
Creatinine: 1.37 mg/dL — ABNORMAL HIGH (ref 0.61–1.24)
GFR, Estimated: 57 mL/min — ABNORMAL LOW (ref >60)
Glucose, Bld: 95 mg/dL (ref 70–99)
Potassium: 4.1 mmol/L (ref 3.5–5.1)
Sodium: 141 mmol/L (ref 135–145)
Total Bilirubin: 0.7 mg/dL (ref 0.3–1.2)
Total Protein: 6.8 g/dL (ref 6.5–8.1)

## 2021-08-04 MED ORDER — HEPARIN SOD (PORK) LOCK FLUSH 100 UNIT/ML IV SOLN
500.0000 [IU] | Freq: Once | INTRAVENOUS | Status: AC
  Administered 2021-08-04: 500 [IU] via INTRAVENOUS

## 2021-08-04 MED ORDER — SODIUM CHLORIDE 0.9% FLUSH
10.0000 mL | INTRAVENOUS | Status: DC | PRN
  Administered 2021-08-04: 10 mL via INTRAVENOUS

## 2021-08-04 NOTE — Progress Notes (Signed)
?Hematology and Oncology Follow Up Visit ? ?John Douglas ?[id number] ?Jan 10, 1956 66 y.o. ?08/04/2021 ? ? 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 ?Past Therapy: ?FOLFOX --  S/p cycle #10-- started on 11/17/2019 ?XRT/Xeloda -- start on 04/01/2020 -- completed 04/27/2020 ?5-FU/Avastin -- maintenance -- started on 08/22/2020, s/p cycle 2 ?  ?Current Therapy:  ?Eliquis 5 mg p.o. twice daily ?Aspirin 81 mg p.o. daily ?John Douglas (biosimilar Avastin)/Xeloda maintenance - started 10/12/2020 ?  ?Interim History:  [name] is here today for follow-up.  Have not seen him since late December.  He has been doing pretty well.  We did do a PET scan on him back in January.  The PET scan was actually negative for any obvious active disease. ? ?He saw Dr. Dema Severin of surgical oncology.  Dr. Dema Severin talk to him about the possibility of surgery.  However, [name] was not sure he wanted surgery.  Dr. Dema Severin was not sure that he still had any disease. ? ?A endoscopy was done.  He did have a little bit of a rectal stricture.  Biopsies were taken.  The pathology report ([id number]) showed some inflammatory changes.  There is no evidence of malignancy. ? ?His last CEA was 1.45. ? ?He feels great.  He has had no problems with bleeding.  He has had no issues with his Eliquis.  He has atrial fibrillation is gone. ? ?He has had no cough or shortness of breath.  He has had no obvious change in bowel or bladder habits.  He has had no rashes.  Is been no leg swelling. ? ?He does have diabetes.  However, his blood sugars have been under very good control. ? ?Overall, his performance status is ECOG 0.    ? ?Medications:  ?Allergies as of 08/04/2021   ? ?   Reactions  ? Celexa [citalopram] Other (See Comments)  ? Contraindicated with A-Fib  ? Ibuprofen Other (See Comments)  ? Was told to not take this   ? Metformin And Related Other  (See Comments)  ? Bloody stools   ? Naproxen Other (See Comments)  ? Was told to not take this   ? Yellow Jacket Venom [bee Venom] Swelling, Other (See Comments)  ? Severe swelling where stung  ? Penicillins Hives  ? Did it involve swelling of the face/tongue/throat, SOB, or low BP? Unk ?Did it involve sudden or severe rash/hives, skin peeling, or any reaction on the inside of your mouth or nose? Yes ?Did you need to seek medical attention at a hospital or doctor's office? Unk ?When did it last happen? "Childhood- 55 years ago" ?If all above answers are "NO", may proceed with cephalosporin use.  ? ?  ? ?  ?Medication List  ?  ? ?  ? Accurate as of August 04, 2021  4:19 PM. If you have any questions, ask your nurse or doctor.  ?  ?  ? ?  ? ?diltiazem 180 MG 24 hr capsule ?Commonly known as: CARDIZEM CD ?TAKE 1 CAPSULE BY MOUTH EVERY DAY ?  ?Eliquis 5 MG Tabs tablet ?Generic drug: apixaban ?TAKE 1 TABLET BY MOUTH TWICE A DAY ?  ?ezetimibe 10 MG tablet ?Commonly known as: ZETIA ?Take 1 tablet by mouth once daily ?  ?FLUoxetine 20 MG capsule ?Commonly known as: PROZAC ?Take 20 mg by mouth every morning. ?  ?fluticasone 50 MCG/ACT nasal spray ?Commonly known as: Flonase ?Place 1 spray into both  nostrils 2 (two) times daily as needed (sinus congestion). ?  ?furosemide 40 MG tablet ?Commonly known as: LASIX ?TAKE 1 TABLET BY MOUTH EVERY DAY ?  ?glimepiride 1 MG tablet ?Commonly known as: AMARYL ?Take 1 mg by mouth daily. ?  ?glucose blood test strip ?Monitor BS before meals and at bedtime for a couple of weeks. Then may decrease to bid before meals ?  ?metoprolol succinate 50 MG 24 hr tablet ?Commonly known as: TOPROL-XL ?Take 1 tablet (50 mg total) by mouth daily. Take with or immediately following a meal. ?  ?nitroGLYCERIN 0.4 MG SL tablet ?Commonly known as: NITROSTAT ?Place 1 tablet (0.4 mg total) under the tongue every 5 (five) minutes as needed for chest pain. ?  ?onetouch ultrasoft lancets ?Use as instructed ?   ?Potassium Chloride ER 20 MEQ Tbcr ?Take 2 tablets by mouth daily. ?  ?pramoxine-hydrocortisone 1-1 % rectal cream ?Commonly known as: PROCTOCREAM-HC ?Place 1 application rectally 2 (two) times daily as needed for hemorrhoids or anal itching. ?  ? ?  ? ? ?Allergies:  ?Allergies  ?Allergen Reactions  ? Celexa [Citalopram] Other (See Comments)  ?  Contraindicated with A-Fib  ? Ibuprofen Other (See Comments)  ?  Was told to not take this   ? Metformin And Related Other (See Comments)  ?  Bloody stools   ? Naproxen Other (See Comments)  ?  Was told to not take this   ? Yellow Jacket Venom [Bee Venom] Swelling and Other (See Comments)  ?  Severe swelling where stung  ? Penicillins Hives  ?  Did it involve swelling of the face/tongue/throat, SOB, or low BP? Unk ?Did it involve sudden or severe rash/hives, skin peeling, or any reaction on the inside of your mouth or nose? Yes ?Did you need to seek medical attention at a hospital or doctor's office? Unk ?When did it last happen? "Childhood- 53 years ago" ?If all above answers are "NO", may proceed with cephalosporin use. ?  ? ? ?Past Medical History, Surgical history, Social history, and Family History were reviewed and updated. ? ?Review of Systems: ?. ?Review of Systems  ?Constitutional: Negative.   ?HENT: Negative.    ?Eyes: Negative.   ?Respiratory: Negative.    ?Cardiovascular: Negative.   ?Gastrointestinal: Negative.   ?Genitourinary: Negative.   ?Musculoskeletal:  Positive for joint pain.  ?Skin: Negative.   ?Neurological: Negative.   ?Endo/Heme/Allergies: Negative.   ?Psychiatric/Behavioral: Negative.    ? ? ?Physical Exam: ? height is $RemoveB'5\' 10"'gGTLxSiN$  (1.778 m) and weight is 217 lb (98.4 kg). His oral temperature is 98.7 ?F (37.1 ?C). His blood pressure is 124/75 and his pulse is 56 (abnormal). His respiration is 18 and oxygen saturation is 99%.  ? ?Wt Readings from Last 3 Encounters:  ?08/04/21 217 lb (98.4 kg)  ?07/17/21 223 lb (101.2 kg)  ?07/06/21 223 lb (101.2 kg)   ? ? ?Physical Exam ?Vitals reviewed.  ?HENT:  ?   Head: Normocephalic and atraumatic.  ?Eyes:  ?   Pupils: Pupils are equal, round, and reactive to light.  ?Cardiovascular:  ?   Rate and Rhythm: Normal rate and regular rhythm.  ?   Heart sounds: Normal heart sounds.  ?Pulmonary:  ?   Effort: Pulmonary effort is normal.  ?   Breath sounds: Normal breath sounds.  ?Abdominal:  ?   General: Bowel sounds are normal.  ?   Palpations: Abdomen is soft.  ?Musculoskeletal:     ?   General: No tenderness or  deformity. Normal range of motion.  ?   Cervical back: Normal range of motion.  ?   Comments: He does have some swelling in the fingers of his right hand.  The main finger is his second finger.  This is quite swollen.  He does have some limited range of motion of his fingers.  It is hard for him to make a fist.  ?Lymphadenopathy:  ?   Cervical: No cervical adenopathy.  ?Skin: ?   General: Skin is warm and dry.  ?   Findings: No erythema or rash.  ?Neurological:  ?   Mental Status: He is alert and oriented to person, place, and time.  ?Psychiatric:     ?   Behavior: Behavior normal.     ?   Thought Content: Thought content normal.     ?   Judgment: Judgment normal.  ? ? ?Lab Results  ?Component Value Date  ? WBC 7.9 08/04/2021  ? HGB 13.7 08/04/2021  ? HCT 40.0 08/04/2021  ? MCV 91.3 08/04/2021  ? PLT 219 08/04/2021  ? ?Lab Results  ?Component Value Date  ? FERRITIN 374 (H) 02/14/2021  ? IRON 125 02/14/2021  ? TIBC 329 02/14/2021  ? UIBC 204 02/14/2021  ? IRONPCTSAT 38 02/14/2021  ? ?Lab Results  ?Component Value Date  ? RETICCTPCT 5.4 (H) 08/15/2020  ? RBC 4.38 08/04/2021  ? ?No results found for: KPAFRELGTCHN, LAMBDASER, KAPLAMBRATIO ?No results found for: IGGSERUM, IGA, IGMSERUM ?No results found for: TOTALPROTELP, ALBUMINELP, A1GS, A2GS, BETS, BETA2SER, GAMS, MSPIKE, SPEI ?  Chemistry   ?   ?Component Value Date/Time  ? NA 141 08/04/2021 1525  ? NA 140 08/28/2019 0940  ? K 4.1 08/04/2021 1525  ? CL 106 08/04/2021 1525   ? CO2 29 08/04/2021 1525  ? BUN 27 (H) 08/04/2021 1525  ? BUN 21 08/28/2019 0940  ? CREATININE 1.37 (H) 08/04/2021 1525  ?    ?Component Value Date/Time  ? CALCIUM 9.7 08/04/2021 1525  ? ALKPHOS 56 08/04/2021 1525

## 2021-08-04 NOTE — Patient Instructions (Signed)

## 2021-08-07 LAB — CEA (IN HOUSE-CHCC): CEA (CHCC-In House): 1.74 ng/mL (ref 0.00–5.00)

## 2021-08-08 NOTE — Addendum Note (Signed)
Addended by: Burney Gauze R on: 08/08/2021 12:06 PM ? ? Modules accepted: Orders ? ?

## 2021-08-20 ENCOUNTER — Other Ambulatory Visit: Payer: Self-pay | Admitting: Internal Medicine

## 2021-08-24 ENCOUNTER — Ambulatory Visit: Payer: Medicare HMO | Admitting: Internal Medicine

## 2021-08-24 ENCOUNTER — Encounter: Payer: Self-pay | Admitting: Internal Medicine

## 2021-08-24 VITALS — BP 130/80 | HR 54 | Ht 70.0 in | Wt 220.0 lb

## 2021-08-24 DIAGNOSIS — I5032 Chronic diastolic (congestive) heart failure: Secondary | ICD-10-CM | POA: Diagnosis not present

## 2021-08-24 DIAGNOSIS — I10 Essential (primary) hypertension: Secondary | ICD-10-CM | POA: Diagnosis not present

## 2021-08-24 DIAGNOSIS — C20 Malignant neoplasm of rectum: Secondary | ICD-10-CM

## 2021-08-24 DIAGNOSIS — E1169 Type 2 diabetes mellitus with other specified complication: Secondary | ICD-10-CM

## 2021-08-24 DIAGNOSIS — I2510 Atherosclerotic heart disease of native coronary artery without angina pectoris: Secondary | ICD-10-CM

## 2021-08-24 DIAGNOSIS — I4819 Other persistent atrial fibrillation: Secondary | ICD-10-CM

## 2021-08-24 DIAGNOSIS — E785 Hyperlipidemia, unspecified: Secondary | ICD-10-CM

## 2021-08-24 NOTE — Progress Notes (Signed)
? ?Follow-up Outpatient Visit ?Date: 08/24/2021 ? ?Primary Care Provider: ?Leonides Sake, MD ?[place] ?[place] [address] ? ?Chief Complaint: Follow-up coronary artery disease and atrial fibrillation ? ?HPI:  John Douglas is a 66 y.o. male with history of CAD with inferior STEMI and primary PCI to the RCA in 56/3149 complicated by sustained ventricular tachycardia in the setting of residual left coronary artery disease status post subsequent PCI to the mid LAD (06/2019), hypertension, hyperlipidemia, paroxysmal atrial fibrillation complicated by stroke in 09/2019, ischemic cardiomyopathy, type 2 diabetes mellitus, and recurrent GI bleeding with subsequent diagnosis of rectal cancer s/p neoadjuvant chemotherapy and XRT complicated by colonic stricture (undergoing surveillance with general surgery), who presents for follow-up of coronary artery disease and atrial fibrillation.  I last saw [name] in 02/2021, at which time he was feeling well, though he mentioned slight increase in shortness of breath with strenuous activities.  We agreed to defer additional testing and medication changes. ? ?Today, [name] reports that he continues to feel well.  He denies chest pain, shortness of breath, palpitations, lightheadedness, and edema.  He was recently seen by Dr. Dema Severin (general surgery) for follow-up of his rectal cancer.  Though colonic stricture was noted with inability to traverse the stricture endoscopically to exclude proximal malignancy, Mr. Sherrard elected to defer colectomy in favor of close surveillance.  He remains very active [place] his [place] without significant limitations.  He has not had any bleeding.  Mr. Bob notes that he may need a tooth extraction soon and inquires about safety of this in the setting of anticoagulation. ? ?-------------------------------------------------------------------------------------------------- ? ?Past Medical History:  ?Diagnosis Date  ? Allergic rhinitis   ?  [facility]  ? Anxiety   ? Benign essential hypertension   ? [facility]  ? Coronary artery disease   ? [facility]  ? Diabetic neuropathy, type II diabetes mellitus ([place])   ? [facility]  ? Dysrhythmia   ? Iron deficiency anemia due to chronic blood loss 11/18/2019  ? Mixed hyperlipidemia   ? [facility]  ? Myocardial infarct [facility])   ? [facility]  ? Obesity, unspecified   ? [facility]  ? Persistent atrial fibrillation ([place])   ? Rectal bleeding   ? [facility]  ? 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 ([place]) 11/17/2019  ? Stroke [facility]   ? Wide-complex tachycardia 06/04/2019  ? ?Past Surgical History:  ?Procedure Laterality Date  ? BIOPSY  10/07/2019  ? Procedure: BIOPSY;  Surgeon: Lavena Bullion, DO;  Location: [facility];  Service: Gastroenterology;;  ? COLONOSCOPY WITH PROPOFOL N/A 10/07/2019  ? Procedure: COLONOSCOPY WITH PROPOFOL;  Surgeon: Lavena Bullion, DO;  Location: [facility];  Service: Gastroenterology;  Laterality: N/A;  ? CORONARY ANGIOPLASTY    ? 3 stents  ? IR IMAGING GUIDED PORT INSERTION  11/24/2019  ? LEFT HEART CATH AND CORONARY ANGIOGRAPHY N/A 06/05/2019  ? Procedure: LEFT HEART CATH AND CORONARY ANGIOGRAPHY;  Surgeon: Nelva Bush, MD;  Location: [facility];  Service: Cardiovascular;  Laterality: N/A;  ? POLYPECTOMY  10/07/2019  ? Procedure: POLYPECTOMY;  Surgeon: Lavena Bullion, DO;  Location: [facility];  Service: Gastroenterology;;  ? SUBMUCOSAL TATTOO INJECTION  10/07/2019  ? Procedure: SUBMUCOSAL TATTOO INJECTION;  Surgeon: Lavena Bullion, DO;  Location: [facility];  Service: Gastroenterology;;  ? ? ?Current Meds  ?Medication Sig  ?  diltiazem (CARDIZEM CD) 180 MG 24 hr capsule TAKE 1 CAPSULE BY MOUTH EVERY DAY  ? ELIQUIS 5 MG TABS tablet TAKE 1 TABLET BY MOUTH TWICE A DAY  ? ezetimibe (ZETIA) 10  MG tablet Take 1 tablet by mouth once daily  ? FLUoxetine (PROZAC) 20 MG capsule Take 20 mg by mouth every morning.  ? fluticasone (FLONASE) 50 MCG/ACT nasal spray Place 1 spray into both nostrils 2 (two) times daily as needed (sinus congestion).  ? furosemide (LASIX) 40 MG tablet TAKE 1 TABLET BY MOUTH EVERY DAY  ? glimepiride (AMARYL) 1 MG tablet Take 1 mg by mouth daily.  ? glucose blood test strip Monitor BS before meals and at bedtime for a couple of weeks. Then may decrease to bid before meals  ? Lancets (ONETOUCH ULTRASOFT) lancets Use as instructed  ? metoprolol succinate (TOPROL-XL) 50 MG 24 hr tablet Take 1 tablet (50 mg total) by mouth daily. Take with or immediately following a meal.  ? nitroGLYCERIN (NITROSTAT) 0.4 MG SL tablet Place 1 tablet (0.4 mg total) under the tongue every 5 (five) minutes as needed for chest pain.  ? Potassium Chloride ER 20 MEQ TBCR TAKE 2 TABLETS BY MOUTH DAILY  ? pramoxine-hydrocortisone (PROCTOCREAM-HC) 1-1 % rectal cream Place 1 application rectally 2 (two) times daily as needed for hemorrhoids or anal itching.  ? ? ?Allergies: Celexa [citalopram], Ibuprofen, Metformin and related, Naproxen, Yellow jacket venom [bee venom], and Penicillins ? ?Social History  ? ?Tobacco Use  ? Smoking status: Never  ? Smokeless tobacco: Former  ?  Types: Chew  ?  Quit date: 05/04/2019  ?Vaping Use  ? Vaping Use: Never used  ?Substance Use Topics  ? Alcohol use: Not Currently  ?  Comment: occasional  ? Drug use: Yes  ?  Types: Marijuana  ?  Comment: occassionally  ? ? ?Family History  ?Problem Relation Age of Onset  ? Diverticulitis Mother   ? Hypertension Father   ? Heart disease Father   ?     MI  ? Prostate cancer Father   ? Colon cancer Neg Hx   ? Stomach cancer Neg Hx   ? Pancreatic cancer Neg Hx   ? Rectal cancer Neg Hx   ? ? ?Review of Systems: ?A 12-system review of systems was performed and was negative except as noted in the  HPI. ? ?-------------------------------------------------------------------------------------------------- ? ?Physical Exam: ?BP 130/80 (BP Location: Left Arm, Patient Position: Sitting, Cuff Size: Large)   Pulse (!) 54   Ht '5\' 10"'$  (1.778 m)   Wt 220 lb (99.8 kg)   SpO2 98%   BMI 31.57 kg/m?  ? ?General:  NAD. ?Neck: No JVD or HJR. ?Lungs: Clear to auscultation bilaterally without wheezes or crackles. ?Heart: Bradycardic but regular without murmurs, rubs, or gallops. ?Abdomen: Soft, nontender, nondistended. ?Extremities: No lower extremity edema. ? ?EKG:  Sinus bradycardia with 1st degree AV block, lateral ST/T changes, and inferior infarct.  HR is slightly lower compared with 03/08/2021.  Otherwise, no significant interval change. ? ?Lab Results  ?Component Value Date  ? WBC 7.9 08/04/2021  ? HGB 13.7 08/04/2021  ? HCT 40.0 08/04/2021  ? MCV 91.3 08/04/2021  ? PLT 219 08/04/2021  ? ? ?Lab Results  ?Component Value Date  ? NA 141 08/04/2021  ? K 4.1 08/04/2021  ? CL 106 08/04/2021  ? CO2 29 08/04/2021  ? BUN 27 (H) 08/04/2021  ? CREATININE 1.37 (H) 08/04/2021  ? GLUCOSE 95 08/04/2021  ? ALT  11 08/04/2021  ? ? ?Lab Results  ?Component Value Date  ? CHOL 148 10/09/2019  ? HDL 26 (L) 10/09/2019  ? [place] 98 10/09/2019  ? TRIG 119 10/09/2019  ? CHOLHDL 5.7 10/09/2019  ? ? ?-------------------------------------------------------------------------------------------------- ? ?ASSESSMENT AND PLAN: ?Coronary artery disease: ?No recurrent angina reported.  Continue current medications for secondary prevention including apixaban and lieu of aspirin in the setting of atrial fibrillation and ezetimibe and lieu of statins given history of statin intolerance and excellent LDL (followed through PCPs office). ? ?Persistent atrial fibrillation: ?Mr. Frampton is maintaining sinus rhythm, albeit with mild bradycardia and continued first-degree AV block.  He has previously been reluctant to de-escalate/stop diltiazem and metoprolol  out of concerns that he may have recurrent atrial fibrillation.  Given that he is asymptomatic, we will not make any medication changes today, though we will need to continue monitoring his heart rate and AV node conduction.  We will continue with indefinite anticoagulati

## 2021-08-24 NOTE — Patient Instructions (Signed)

## 2021-08-25 ENCOUNTER — Encounter: Payer: Self-pay | Admitting: Internal Medicine

## 2021-09-01 ENCOUNTER — Other Ambulatory Visit: Payer: Self-pay | Admitting: Internal Medicine

## 2021-09-01 NOTE — Telephone Encounter (Signed)
Ok to send

## 2021-09-20 ENCOUNTER — Ambulatory Visit (HOSPITAL_COMMUNITY)
Admission: RE | Admit: 2021-09-20 | Discharge: 2021-09-20 | Disposition: A | Discharge status: Home / Self Care | Payer: Medicare HMO | Source: Ambulatory Visit | Attending: Hematology & Oncology | Admitting: Hematology & Oncology

## 2021-09-20 DIAGNOSIS — C20 Malignant neoplasm of rectum: Secondary | ICD-10-CM | POA: Diagnosis present

## 2021-09-20 LAB — GLUCOSE, CAPILLARY: Glucose-Capillary: 150 mg/dL — ABNORMAL HIGH (ref 70–99)

## 2021-09-20 MED ORDER — FLUDEOXYGLUCOSE F - 18 (FDG) INJECTION
10.4000 | Freq: Once | INTRAVENOUS | Status: AC | PRN
  Administered 2021-09-20: 10.9 via INTRAVENOUS

## 2021-09-22 ENCOUNTER — Telehealth: Payer: Self-pay | Admitting: *Deleted

## 2021-09-22 NOTE — Telephone Encounter (Signed)
As noted below by Dr. Marin Olp, I informed the patient that the PET scan does not show any obvious cancer. He verbalized understanding.

## 2021-09-22 NOTE — Telephone Encounter (Signed)
-----   Message from Volanda Napoleon, MD sent at 09/22/2021  7:59 AM EDT ----- Call - the PET scan does NOT show any obvious cancer!!!  pete

## 2021-10-01 ENCOUNTER — Other Ambulatory Visit: Payer: Self-pay | Admitting: Internal Medicine

## 2021-10-05 ENCOUNTER — Other Ambulatory Visit: Payer: Self-pay | Admitting: Oncology

## 2021-10-05 ENCOUNTER — Encounter: Payer: Self-pay | Admitting: Hematology & Oncology

## 2021-10-05 ENCOUNTER — Inpatient Hospital Stay: Payer: Medicare HMO

## 2021-10-05 ENCOUNTER — Other Ambulatory Visit: Payer: Self-pay

## 2021-10-05 ENCOUNTER — Inpatient Hospital Stay: Payer: Medicare HMO | Admitting: Hematology & Oncology

## 2021-10-05 ENCOUNTER — Inpatient Hospital Stay: Payer: Medicare HMO | Attending: Hematology & Oncology

## 2021-10-05 VITALS — BP 122/60 | HR 58 | Temp 98.2°F | Resp 18 | Ht 70.0 in | Wt 221.0 lb

## 2021-10-05 DIAGNOSIS — Z7982 Long term (current) use of aspirin: Secondary | ICD-10-CM | POA: Diagnosis not present

## 2021-10-05 DIAGNOSIS — K5730 Diverticulosis of large intestine without perforation or abscess without bleeding: Secondary | ICD-10-CM

## 2021-10-05 DIAGNOSIS — Z7901 Long term (current) use of anticoagulants: Secondary | ICD-10-CM | POA: Insufficient documentation

## 2021-10-05 DIAGNOSIS — K625 Hemorrhage of anus and rectum: Secondary | ICD-10-CM

## 2021-10-05 DIAGNOSIS — C20 Malignant neoplasm of rectum: Secondary | ICD-10-CM | POA: Insufficient documentation

## 2021-10-05 DIAGNOSIS — I480 Paroxysmal atrial fibrillation: Secondary | ICD-10-CM | POA: Insufficient documentation

## 2021-10-05 DIAGNOSIS — Z452 Encounter for adjustment and management of vascular access device: Secondary | ICD-10-CM | POA: Diagnosis not present

## 2021-10-05 LAB — CBC WITH DIFFERENTIAL (CANCER CENTER ONLY)
Abs Immature Granulocytes: 0.02 10*3/uL (ref 0.00–0.07)
Basophils Absolute: 0.1 10*3/uL (ref 0.0–0.1)
Basophils Relative: 1 %
Eosinophils Absolute: 0.2 10*3/uL (ref 0.0–0.5)
Eosinophils Relative: 2 %
HCT: 41.1 % (ref 39.0–52.0)
Hemoglobin: 14.3 g/dL (ref 13.0–17.0)
Immature Granulocytes: 0 %
Lymphocytes Relative: 20 %
Lymphs Abs: 1.6 10*3/uL (ref 0.7–4.0)
MCH: 30.8 pg (ref 26.0–34.0)
MCHC: 34.8 g/dL (ref 30.0–36.0)
MCV: 88.6 fL (ref 80.0–100.0)
Monocytes Absolute: 0.5 10*3/uL (ref 0.1–1.0)
Monocytes Relative: 6 %
Neutro Abs: 5.6 10*3/uL (ref 1.7–7.7)
Neutrophils Relative %: 71 %
Platelet Count: 237 10*3/uL (ref 150–400)
RBC: 4.64 MIL/uL (ref 4.22–5.81)
RDW: 15.8 % — ABNORMAL HIGH (ref 11.5–15.5)
WBC Count: 7.9 10*3/uL (ref 4.0–10.5)
nRBC: 0 % (ref 0.0–0.2)

## 2021-10-05 LAB — CMP (CANCER CENTER ONLY)
ALT: 11 U/L (ref 0–44)
AST: 13 U/L — ABNORMAL LOW (ref 15–41)
Albumin: 4.4 g/dL (ref 3.5–5.0)
Alkaline Phosphatase: 60 U/L (ref 38–126)
Anion gap: 7 (ref 5–15)
BUN: 22 mg/dL (ref 8–23)
CO2: 30 mmol/L (ref 22–32)
Calcium: 9.5 mg/dL (ref 8.9–10.3)
Chloride: 104 mmol/L (ref 98–111)
Creatinine: 1.38 mg/dL — ABNORMAL HIGH (ref 0.61–1.24)
GFR, Estimated: 56 mL/min — ABNORMAL LOW (ref >60)
Glucose, Bld: 134 mg/dL — ABNORMAL HIGH (ref 70–99)
Potassium: 3.7 mmol/L (ref 3.5–5.1)
Sodium: 141 mmol/L (ref 135–145)
Total Bilirubin: 0.8 mg/dL (ref 0.3–1.2)
Total Protein: 6.7 g/dL (ref 6.5–8.1)

## 2021-10-05 LAB — LACTATE DEHYDROGENASE: LDH: 179 U/L (ref 98–192)

## 2021-10-05 LAB — CEA (IN HOUSE-CHCC): CEA (CHCC-In House): 1.1 ng/mL (ref 0.00–5.00)

## 2021-10-05 MED ORDER — SODIUM CHLORIDE 0.9% FLUSH
10.0000 mL | Freq: Once | INTRAVENOUS | Status: AC | PRN
  Administered 2021-10-05: 10 mL

## 2021-10-05 MED ORDER — HEPARIN SOD (PORK) LOCK FLUSH 100 UNIT/ML IV SOLN
500.0000 [IU] | Freq: Once | INTRAVENOUS | Status: AC | PRN
  Administered 2021-10-05: 500 [IU]

## 2021-10-05 NOTE — Progress Notes (Signed)
Hematology and Oncology Follow Up Visit  John Douglas [id number] [birth year] 66 y.o. 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 twice daily Aspirin 81 mg p.o. daily Zirabev (biosimilar Avastin)/Xeloda maintenance - started 10/12/2020 -- d/c on 11/2020   Interim History:  John Douglas is here today for follow-up.  He looks quite good.  He feels good.  He has gained a little bit of weight.  He has had no problems with abdominal pain.  He has had no problems with cough or shortness of breath.  He is still on the Eliquis.  He has paroxysmal atrial fibrillation.  We did do a PET scan on him.  This was done a week or so ago.  The PET scan did not show any evidence of obvious active disease.  His last CEA level back in April was 1.74.  He does not want any surgery.  He does not want to have a ostomy bag.  He has had no cough.  There is been no chest wall pain.  He has had no leg swelling.  Overall, I would say his performance status is probably ECOG 1.   Medications:  Allergies as of 10/05/2021       Reactions   Celexa [citalopram] Other (See Comments)   Contraindicated with A-Fib   Ibuprofen Other (See Comments)   Was told to not take this    Metformin And Related Other (See Comments)   Bloody stools    Naproxen Other (See Comments)   Was told to not take this    Yellow Jacket Venom [bee Venom] Swelling, Other (See Comments)   Severe swelling where stung   Penicillins Hives   Did it involve swelling of the face/tongue/throat, SOB, or low BP? Unk Did it involve sudden or severe rash/hives, skin peeling, or any reaction on  the inside of your mouth or nose? Yes Did you need to seek medical attention at a hospital or doctor's office? Unk When did it last happen? "Childhood- 55 years ago" If all above answers are "NO", may proceed with cephalosporin use.        Medication List        Accurate as of October 05, 2021  4:15 PM. If you have any questions, ask your nurse or doctor.          diltiazem 180 MG 24 hr capsule Commonly known as: CARDIZEM CD TAKE 1 CAPSULE BY MOUTH EVERY DAY   Eliquis 5 MG Tabs tablet Generic drug: apixaban TAKE 1 TABLET BY MOUTH TWICE A DAY   ezetimibe 10 MG tablet Commonly known as: ZETIA Take 1 tablet by mouth once daily   FLUoxetine 20 MG capsule Commonly known as: PROZAC Take 20 mg by mouth every morning.   fluticasone 50 MCG/ACT nasal spray Commonly known as: Flonase Place 1 spray into both nostrils 2 (two) times daily as needed (sinus congestion).   furosemide 40 MG tablet Commonly known as: LASIX TAKE 1 TABLET BY MOUTH EVERY DAY   glimepiride 1 MG tablet Commonly known as: AMARYL Take 1 mg by mouth daily.   glucose blood test strip Monitor BS before meals and at bedtime for a couple of weeks. Then may decrease  to bid before meals   Klor-Con M20 20 MEQ tablet Generic drug: potassium chloride SA TAKE 2 TABLETS BY MOUTH EVERY DAY   metoprolol succinate 50 MG 24 hr tablet Commonly known as: TOPROL-XL Take 1 tablet (50 mg total) by mouth daily. Take with or immediately following a meal.   nitroGLYCERIN 0.4 MG SL tablet Commonly known as: NITROSTAT Place 1 tablet (0.4 mg total) under the tongue every 5 (five) minutes as needed for chest pain.   onetouch ultrasoft lancets Use as instructed   pramoxine-hydrocortisone 1-1 % rectal cream Commonly known as: PROCTOCREAM-HC Place 1 application rectally 2 (two) times daily as needed for hemorrhoids or anal itching.        Allergies:  Allergies  Allergen Reactions   Celexa [Citalopram] Other (See  Comments)    Contraindicated with A-Fib   Ibuprofen Other (See Comments)    Was told to not take this    Metformin And Related Other (See Comments)    Bloody stools    Naproxen Other (See Comments)    Was told to not take this    Yellow Jacket Venom [Bee Venom] Swelling and Other (See Comments)    Severe swelling where stung   Penicillins Hives    Did it involve swelling of the face/tongue/throat, SOB, or low BP? Unk Did it involve sudden or severe rash/hives, skin peeling, or any reaction on the inside of your mouth or nose? Yes Did you need to seek medical attention at a hospital or doctor's office? Unk When did it last happen? "Childhood- 53 years ago" If all above answers are "NO", may proceed with cephalosporin use.     Past Medical History, Surgical history, Social history, and Family History were reviewed and updated.  Review of Systems: . Review of Systems  Constitutional: Negative.   HENT: Negative.    Eyes: Negative.   Respiratory: Negative.    Cardiovascular: Negative.   Gastrointestinal: Negative.   Genitourinary: Negative.   Musculoskeletal:  Positive for joint pain.  Skin: Negative.   Neurological: Negative.   Endo/Heme/Allergies: Negative.   Psychiatric/Behavioral: Negative.       Physical Exam:  height is $RemoveB'5\' 10"'yNAlrebL$  (1.778 m) and weight is 221 lb (100.2 kg). His oral temperature is 98.2 F (36.8 C). His blood pressure is 122/60 and his pulse is 58 (abnormal). His respiration is 18 and oxygen saturation is 97%.   Wt Readings from Last 3 Encounters:  10/05/21 221 lb (100.2 kg)  08/24/21 220 lb (99.8 kg)  08/04/21 217 lb (98.4 kg)    Physical Exam Vitals reviewed.  HENT:     Head: Normocephalic and atraumatic.  Eyes:     Pupils: Pupils are equal, round, and reactive to light.  Cardiovascular:     Rate and Rhythm: Normal rate and regular rhythm.     Heart sounds: Normal heart sounds.  Pulmonary:     Effort: Pulmonary effort is normal.     Breath  sounds: Normal breath sounds.  Abdominal:     General: Bowel sounds are normal.     Palpations: Abdomen is soft.  Musculoskeletal:        General: No tenderness or deformity. Normal range of motion.     Cervical back: Normal range of motion.     Comments: He does have some swelling in the fingers of his right hand.  The main finger is his second finger.  This is quite swollen.  He does have some limited range of motion of his fingers.  It is hard for him to make a fist.  Lymphadenopathy:     Cervical: No cervical adenopathy.  Skin:    General: Skin is warm and dry.     Findings: No erythema or rash.  Neurological:     Mental Status: He is alert and oriented to person, place, and time.  Psychiatric:        Behavior: Behavior normal.        Thought Content: Thought content normal.        Judgment: Judgment normal.     Lab Results  Component Value Date   WBC 7.9 10/05/2021   HGB 14.3 10/05/2021   HCT 41.1 10/05/2021   MCV 88.6 10/05/2021   PLT 237 10/05/2021   Lab Results  Component Value Date   FERRITIN 374 (H) 02/14/2021   IRON 125 02/14/2021   TIBC 329 02/14/2021   UIBC 204 02/14/2021   IRONPCTSAT 38 02/14/2021   Lab Results  Component Value Date   RETICCTPCT 5.4 (H) 08/15/2020   RBC 4.64 10/05/2021   No results found for: "KPAFRELGTCHN", "LAMBDASER", "KAPLAMBRATIO" No results found for: "IGGSERUM", "IGA", "IGMSERUM" No results found for: "TOTALPROTELP", "ALBUMINELP", "A1GS", "A2GS", "BETS", "BETA2SER", "GAMS", "MSPIKE", "SPEI"   Chemistry      Component Value Date/Time   NA 141 10/05/2021 1431   NA 140 08/28/2019 0940   K 3.7 10/05/2021 1431   CL 104 10/05/2021 1431   CO2 30 10/05/2021 1431   BUN 22 10/05/2021 1431   BUN 21 08/28/2019 0940   CREATININE 1.38 (H) 10/05/2021 1431      Component Value Date/Time   CALCIUM 9.5 10/05/2021 1431   ALKPHOS 60 10/05/2021 1431   AST 13 (L) 10/05/2021 1431   ALT 11 10/05/2021 1431   BILITOT 0.8 10/05/2021 1431        Impression and Plan: Mr. Runk is a pleasant 66 yo caucasian gentleman with  history of CVA secondary to atrial fib and rectal bleeding. He was diagnosed with metastatic adenocarcinoma of the rectum-K-ras wild type/BRAF wild-type/HER-2 negative/MMR proficient. He had adenopathy distant to the rectum.   For right now, we will just follow him along.  Hopefully, he will stay in remission for quite a while.  I do have to worry about the possibility of recurrence on him.  I know he was treated incredibly aggressively.  I probably would not do another PET scan until after Labor Day.  I will see him back in 6 weeks.  We will make sure his Port-A-Cath is flushed.  Volanda Napoleon, MD 6/8/20234:15 PM

## 2021-10-05 NOTE — Patient Instructions (Signed)

## 2021-10-13 ENCOUNTER — Other Ambulatory Visit: Payer: Self-pay | Admitting: Hematology & Oncology

## 2021-10-14 ENCOUNTER — Encounter: Payer: Self-pay | Admitting: Hematology & Oncology

## 2021-11-20 ENCOUNTER — Other Ambulatory Visit: Payer: Self-pay

## 2021-11-23 ENCOUNTER — Other Ambulatory Visit: Payer: Self-pay

## 2021-11-23 ENCOUNTER — Encounter: Payer: Self-pay | Admitting: Hematology & Oncology

## 2021-11-23 ENCOUNTER — Inpatient Hospital Stay: Payer: Medicare HMO | Admitting: Hematology & Oncology

## 2021-11-23 ENCOUNTER — Inpatient Hospital Stay: Payer: Medicare HMO | Attending: Hematology & Oncology

## 2021-11-23 ENCOUNTER — Inpatient Hospital Stay: Payer: Medicare HMO

## 2021-11-23 VITALS — BP 149/92 | HR 58 | Temp 98.2°F | Resp 18 | Ht 70.0 in | Wt 225.0 lb

## 2021-11-23 DIAGNOSIS — C20 Malignant neoplasm of rectum: Secondary | ICD-10-CM | POA: Diagnosis not present

## 2021-11-23 DIAGNOSIS — Z7901 Long term (current) use of anticoagulants: Secondary | ICD-10-CM | POA: Diagnosis not present

## 2021-11-23 DIAGNOSIS — I4891 Unspecified atrial fibrillation: Secondary | ICD-10-CM | POA: Diagnosis not present

## 2021-11-23 DIAGNOSIS — Z7982 Long term (current) use of aspirin: Secondary | ICD-10-CM | POA: Diagnosis not present

## 2021-11-23 DIAGNOSIS — Z08 Encounter for follow-up examination after completed treatment for malignant neoplasm: Secondary | ICD-10-CM | POA: Diagnosis present

## 2021-11-23 DIAGNOSIS — Z85048 Personal history of other malignant neoplasm of rectum, rectosigmoid junction, and anus: Secondary | ICD-10-CM | POA: Diagnosis present

## 2021-11-23 LAB — CBC WITH DIFFERENTIAL (CANCER CENTER ONLY)
Abs Immature Granulocytes: 0.05 10*3/uL (ref 0.00–0.07)
Basophils Absolute: 0 10*3/uL (ref 0.0–0.1)
Basophils Relative: 1 %
Eosinophils Absolute: 0.1 10*3/uL (ref 0.0–0.5)
Eosinophils Relative: 2 %
HCT: 38.5 % — ABNORMAL LOW (ref 39.0–52.0)
Hemoglobin: 13.2 g/dL (ref 13.0–17.0)
Immature Granulocytes: 1 %
Lymphocytes Relative: 16 %
Lymphs Abs: 1.3 10*3/uL (ref 0.7–4.0)
MCH: 30.6 pg (ref 26.0–34.0)
MCHC: 34.3 g/dL (ref 30.0–36.0)
MCV: 89.1 fL (ref 80.0–100.0)
Monocytes Absolute: 0.6 10*3/uL (ref 0.1–1.0)
Monocytes Relative: 7 %
Neutro Abs: 6.3 10*3/uL (ref 1.7–7.7)
Neutrophils Relative %: 73 %
Platelet Count: 193 10*3/uL (ref 150–400)
RBC: 4.32 MIL/uL (ref 4.22–5.81)
RDW: 15.9 % — ABNORMAL HIGH (ref 11.5–15.5)
WBC Count: 8.4 10*3/uL (ref 4.0–10.5)
nRBC: 0 % (ref 0.0–0.2)

## 2021-11-23 LAB — CMP (CANCER CENTER ONLY)
ALT: 12 U/L (ref 0–44)
AST: 13 U/L — ABNORMAL LOW (ref 15–41)
Albumin: 4.4 g/dL (ref 3.5–5.0)
Alkaline Phosphatase: 59 U/L (ref 38–126)
Anion gap: 8 (ref 5–15)
BUN: 17 mg/dL (ref 8–23)
CO2: 30 mmol/L (ref 22–32)
Calcium: 9.4 mg/dL (ref 8.9–10.3)
Chloride: 104 mmol/L (ref 98–111)
Creatinine: 1.11 mg/dL (ref 0.61–1.24)
GFR, Estimated: >60 mL/min (ref >60)
Glucose, Bld: 131 mg/dL — ABNORMAL HIGH (ref 70–99)
Potassium: 3.3 mmol/L — ABNORMAL LOW (ref 3.5–5.1)
Sodium: 142 mmol/L (ref 135–145)
Total Bilirubin: 0.8 mg/dL (ref 0.3–1.2)
Total Protein: 6.6 g/dL (ref 6.5–8.1)

## 2021-11-23 LAB — LACTATE DEHYDROGENASE: LDH: 184 U/L (ref 98–192)

## 2021-11-23 LAB — CEA (IN HOUSE-CHCC): CEA (CHCC-In House): 1.6 ng/mL (ref 0.00–5.00)

## 2021-11-23 NOTE — Patient Instructions (Signed)

## 2021-11-23 NOTE — Progress Notes (Signed)
Hematology and Oncology Follow Up Visit  John Douglas [id number] [birth year] 66 y.o. 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 twice daily Aspirin 81 mg p.o. daily Zirabev (biosimilar Avastin)/Xeloda maintenance - started 10/12/2020 -- d/c on 11/2020   Interim History:  John Douglas is here today for follow-up.  We last saw him back in May.  Since then, has been doing okay.  He had a good summer so far.  He is going go to the mountains this weekend.  It sounds like he will have a good time.  He has had no problems with bowels or bladder.  He is on Eliquis because of his atrial fibrillation.  This appears to be back in sinus rhythm 40 says.  He has had no abdominal pain.  He has had no problems with cough or shortness of breath.  There is been no leg swelling.  His last CEA level was 1.1.  He has had no cough or shortness of breath.  He has had no headache.  So far, he is doing well with respect to having the past CVA.  Currently, I would say his performance status is probably ECOG is 0.    Medications:  Allergies as of 11/23/2021       Reactions   Celexa [citalopram] Other (See Comments)   Contraindicated with A-Fib   Ibuprofen Other (See Comments)   Was told to not take this    Metformin And Related Other (See Comments)   Bloody stools    Naproxen Other (See Comments)   Was told to not take this    Yellow Jacket Venom [bee Venom] Swelling, Other (See Comments)   Severe swelling where stung   Penicillins Hives   Did it involve swelling of the face/tongue/throat, SOB, or low BP? Unk Did it involve sudden or severe  rash/hives, skin peeling, or any reaction on the inside of your mouth or nose? Yes Did you need to seek medical attention at a hospital or doctor's office? Unk When did it last happen? "Childhood- 55 years ago" If all above answers are "NO", may proceed with cephalosporin use.        Medication List        Accurate as of November 23, 2021  4:13 PM. If you have any questions, ask your nurse or doctor.          diltiazem 180 MG 24 hr capsule Commonly known as: CARDIZEM CD TAKE 1 CAPSULE BY MOUTH EVERY DAY   Eliquis 5 MG Tabs tablet Generic drug: apixaban TAKE 1 TABLET BY MOUTH TWICE A DAY   ezetimibe 10 MG tablet Commonly known as: ZETIA Take 1 tablet by mouth once daily   FLUoxetine 20 MG capsule Commonly known as: PROZAC Take 20 mg by mouth every morning.   fluticasone 50 MCG/ACT nasal spray Commonly known as: Flonase Place 1 spray into both nostrils 2 (two) times daily as needed (sinus congestion).   furosemide 40 MG tablet Commonly known as: LASIX TAKE 1 TABLET BY MOUTH EVERY DAY   glimepiride 1 MG tablet Commonly known as: AMARYL Take 1 mg by mouth daily.   glucose blood test strip Monitor BS before meals and at bedtime  for a couple of weeks. Then may decrease to bid before meals   Klor-Con M20 20 MEQ tablet Generic drug: potassium chloride SA TAKE 2 TABLETS BY MOUTH EVERY DAY   metoprolol succinate 50 MG 24 hr tablet Commonly known as: TOPROL-XL Take 1 tablet (50 mg total) by mouth daily. Take with or immediately following a meal.   nitroGLYCERIN 0.4 MG SL tablet Commonly known as: NITROSTAT Place 1 tablet (0.4 mg total) under the tongue every 5 (five) minutes as needed for chest pain.   onetouch ultrasoft lancets Use as instructed   pramoxine-hydrocortisone 1-1 % rectal cream Commonly known as: PROCTOCREAM-HC Place 1 application rectally 2 (two) times daily as needed for hemorrhoids or anal itching.        Allergies:  Allergies  Allergen  Reactions   Celexa [Citalopram] Other (See Comments)    Contraindicated with A-Fib   Ibuprofen Other (See Comments)    Was told to not take this    Metformin And Related Other (See Comments)    Bloody stools    Naproxen Other (See Comments)    Was told to not take this    Yellow Jacket Venom [Bee Venom] Swelling and Other (See Comments)    Severe swelling where stung   Penicillins Hives    Did it involve swelling of the face/tongue/throat, SOB, or low BP? Unk Did it involve sudden or severe rash/hives, skin peeling, or any reaction on the inside of your mouth or nose? Yes Did you need to seek medical attention at a hospital or doctor's office? Unk When did it last happen? "Childhood- 55 years ago" If all above answers are "NO", may proceed with cephalosporin use.     Past Medical History, Surgical history, Social history, and Family History were reviewed and updated.  Review of Systems: . Review of Systems  Constitutional: Negative.   HENT: Negative.    Eyes: Negative.   Respiratory: Negative.    Cardiovascular: Negative.   Gastrointestinal: Negative.   Genitourinary: Negative.   Musculoskeletal:  Positive for joint pain.  Skin: Negative.   Neurological: Negative.   Endo/Heme/Allergies: Negative.   Psychiatric/Behavioral: Negative.       Physical Exam:  height is $RemoveB'5\' 10"'eWzDbvgI$  (1.778 m) and weight is 225 lb (102.1 kg). His oral temperature is 98.2 F (36.8 C). His blood pressure is 149/92 (abnormal) and his pulse is 58 (abnormal). His respiration is 18 and oxygen saturation is 97%.   Wt Readings from Last 3 Encounters:  11/23/21 225 lb (102.1 kg)  10/05/21 221 lb (100.2 kg)  08/24/21 220 lb (99.8 kg)    Physical Exam Vitals reviewed.  HENT:     Head: Normocephalic and atraumatic.  Eyes:     Pupils: Pupils are equal, round, and reactive to light.  Cardiovascular:     Rate and Rhythm: Normal rate and regular rhythm.     Heart sounds: Normal heart sounds.  Pulmonary:      Effort: Pulmonary effort is normal.     Breath sounds: Normal breath sounds.  Abdominal:     General: Bowel sounds are normal.     Palpations: Abdomen is soft.  Musculoskeletal:        General: No tenderness or deformity. Normal range of motion.     Cervical back: Normal range of motion.     Comments: He does have some swelling in the fingers of his right hand.  The main finger is his second finger.  This is quite swollen.  He does  have some limited range of motion of his fingers.  It is hard for him to make a fist.  Lymphadenopathy:     Cervical: No cervical adenopathy.  Skin:    General: Skin is warm and dry.     Findings: No erythema or rash.  Neurological:     Mental Status: He is alert and oriented to person, place, and time.  Psychiatric:        Behavior: Behavior normal.        Thought Content: Thought content normal.        Judgment: Judgment normal.    Lab Results  Component Value Date   WBC 8.4 11/23/2021   HGB 13.2 11/23/2021   HCT 38.5 (L) 11/23/2021   MCV 89.1 11/23/2021   PLT 193 11/23/2021   Lab Results  Component Value Date   FERRITIN 374 (H) 02/14/2021   IRON 125 02/14/2021   TIBC 329 02/14/2021   UIBC 204 02/14/2021   IRONPCTSAT 38 02/14/2021   Lab Results  Component Value Date   RETICCTPCT 5.4 (H) 08/15/2020   RBC 4.32 11/23/2021   No results found for: "KPAFRELGTCHN", "LAMBDASER", "KAPLAMBRATIO" No results found for: "IGGSERUM", "IGA", "IGMSERUM" No results found for: "TOTALPROTELP", "ALBUMINELP", "A1GS", "A2GS", "BETS", "BETA2SER", "GAMS", "MSPIKE", "SPEI"   Chemistry      Component Value Date/Time   NA 142 11/23/2021 1400   NA 140 08/28/2019 0940   K 3.3 (L) 11/23/2021 1400   CL 104 11/23/2021 1400   CO2 30 11/23/2021 1400   BUN 17 11/23/2021 1400   BUN 21 08/28/2019 0940   CREATININE 1.11 11/23/2021 1400      Component Value Date/Time   CALCIUM 9.4 11/23/2021 1400   ALKPHOS 59 11/23/2021 1400   AST 13 (L) 11/23/2021 1400   ALT  12 11/23/2021 1400   BILITOT 0.8 11/23/2021 1400       Impression and Plan: John Douglas is a pleasant 66 yo caucasian gentleman with  history of CVA secondary to atrial fib and rectal bleeding. He was diagnosed with metastatic adenocarcinoma of the rectum-K-ras wild type/BRAF wild-type/HER-2 negative/MMR proficient. He had adenopathy distant to the rectum.   I am just amazed as to how well he is done.  He has been on therapy for about a year.  Again he has done incredibly well.  I suppose that his cancer could always come back.  However, for right now, there is no evidence that he does have any malignancy from his last PET scan.  The CEA has been okay.  Given the fact that his CEA has been all right, I think we can hold on doing any PET scans until we see a change in the CEA level.  I know that he does not want any surgery for any kind of residual disease.  He actually did have a colonoscopy back in March.  Thankfully, this did not show any obvious residual malignancy.  He still has his Port-A-Cath in.  We will still flushes every 6 weeks.  I would like to see him back in October.  We will then hopefully be able to get him through the [place] season.   Volanda Napoleon, MD 7/27/20234:13 PM

## 2021-11-25 ENCOUNTER — Other Ambulatory Visit: Payer: Self-pay

## 2021-11-27 ENCOUNTER — Other Ambulatory Visit: Payer: Self-pay

## 2021-11-28 ENCOUNTER — Other Ambulatory Visit: Payer: Self-pay

## 2021-12-04 ENCOUNTER — Telehealth: Payer: Self-pay | Admitting: Internal Medicine

## 2021-12-04 NOTE — Telephone Encounter (Signed)
I spoke with the patient. He stated that he called to make sure that his refills didn't run out before his next appointment.  I advised the patient that his pharmacy should notify us when the refills on his current RX's run out so we can refill these within the appropriate time frame.  The patient voices understanding and is agreeable.

## 2021-12-04 NOTE — Telephone Encounter (Signed)
Asking that his medication be on automatic refill

## 2021-12-05 ENCOUNTER — Other Ambulatory Visit: Payer: Self-pay

## 2021-12-07 ENCOUNTER — Other Ambulatory Visit: Payer: Self-pay | Admitting: Internal Medicine

## 2021-12-14 ENCOUNTER — Other Ambulatory Visit: Payer: Self-pay | Admitting: Internal Medicine

## 2021-12-14 DIAGNOSIS — I4891 Unspecified atrial fibrillation: Secondary | ICD-10-CM

## 2021-12-31 ENCOUNTER — Other Ambulatory Visit: Payer: Self-pay | Admitting: Internal Medicine

## 2022-01-05 ENCOUNTER — Inpatient Hospital Stay: Payer: Medicare HMO | Attending: Hematology & Oncology

## 2022-01-05 DIAGNOSIS — K625 Hemorrhage of anus and rectum: Secondary | ICD-10-CM

## 2022-01-05 DIAGNOSIS — Z452 Encounter for adjustment and management of vascular access device: Secondary | ICD-10-CM | POA: Diagnosis present

## 2022-01-05 DIAGNOSIS — K5730 Diverticulosis of large intestine without perforation or abscess without bleeding: Secondary | ICD-10-CM

## 2022-01-05 DIAGNOSIS — Z85048 Personal history of other malignant neoplasm of rectum, rectosigmoid junction, and anus: Secondary | ICD-10-CM | POA: Diagnosis present

## 2022-01-05 MED ORDER — SODIUM CHLORIDE 0.9% FLUSH
10.0000 mL | Freq: Once | INTRAVENOUS | Status: AC | PRN
  Administered 2022-01-05: 10 mL

## 2022-01-05 MED ORDER — HEPARIN SOD (PORK) LOCK FLUSH 100 UNIT/ML IV SOLN
500.0000 [IU] | Freq: Once | INTRAVENOUS | Status: AC | PRN
  Administered 2022-01-05: 500 [IU]

## 2022-01-05 NOTE — Patient Instructions (Signed)

## 2022-01-29 LAB — HM AWV

## 2022-02-07 ENCOUNTER — Inpatient Hospital Stay: Payer: Medicare HMO | Admitting: Hematology & Oncology

## 2022-02-07 ENCOUNTER — Inpatient Hospital Stay: Payer: Medicare HMO | Attending: Hematology & Oncology

## 2022-02-07 ENCOUNTER — Encounter: Payer: Self-pay | Admitting: Hematology & Oncology

## 2022-02-07 ENCOUNTER — Inpatient Hospital Stay: Payer: Medicare HMO

## 2022-02-07 VITALS — BP 121/99 | HR 96 | Temp 97.6°F | Resp 17 | Wt 219.1 lb

## 2022-02-07 DIAGNOSIS — Z08 Encounter for follow-up examination after completed treatment for malignant neoplasm: Secondary | ICD-10-CM | POA: Insufficient documentation

## 2022-02-07 DIAGNOSIS — C20 Malignant neoplasm of rectum: Secondary | ICD-10-CM

## 2022-02-07 DIAGNOSIS — Z85048 Personal history of other malignant neoplasm of rectum, rectosigmoid junction, and anus: Secondary | ICD-10-CM | POA: Insufficient documentation

## 2022-02-07 DIAGNOSIS — Z452 Encounter for adjustment and management of vascular access device: Secondary | ICD-10-CM | POA: Insufficient documentation

## 2022-02-07 DIAGNOSIS — Z95828 Presence of other vascular implants and grafts: Secondary | ICD-10-CM

## 2022-02-07 LAB — CMP (CANCER CENTER ONLY)
ALT: 12 U/L (ref 0–44)
AST: 11 U/L — ABNORMAL LOW (ref 15–41)
Albumin: 4.4 g/dL (ref 3.5–5.0)
Alkaline Phosphatase: 65 U/L (ref 38–126)
Anion gap: 10 (ref 5–15)
BUN: 38 mg/dL — ABNORMAL HIGH (ref 8–23)
CO2: 27 mmol/L (ref 22–32)
Calcium: 9.9 mg/dL (ref 8.9–10.3)
Chloride: 103 mmol/L (ref 98–111)
Creatinine: 1.38 mg/dL — ABNORMAL HIGH (ref 0.61–1.24)
GFR, Estimated: 56 mL/min — ABNORMAL LOW (ref >60)
Glucose, Bld: 233 mg/dL — ABNORMAL HIGH (ref 70–99)
Potassium: 3.5 mmol/L (ref 3.5–5.1)
Sodium: 140 mmol/L (ref 135–145)
Total Bilirubin: 0.8 mg/dL (ref 0.3–1.2)
Total Protein: 7 g/dL (ref 6.5–8.1)

## 2022-02-07 LAB — CBC WITH DIFFERENTIAL (CANCER CENTER ONLY)
Abs Immature Granulocytes: 0.03 10*3/uL (ref 0.00–0.07)
Basophils Absolute: 0.1 10*3/uL (ref 0.0–0.1)
Basophils Relative: 1 %
Eosinophils Absolute: 0.1 10*3/uL (ref 0.0–0.5)
Eosinophils Relative: 1 %
HCT: 41.7 % (ref 39.0–52.0)
Hemoglobin: 14.5 g/dL (ref 13.0–17.0)
Immature Granulocytes: 0 %
Lymphocytes Relative: 14 %
Lymphs Abs: 1.4 10*3/uL (ref 0.7–4.0)
MCH: 30.5 pg (ref 26.0–34.0)
MCHC: 34.8 g/dL (ref 30.0–36.0)
MCV: 87.6 fL (ref 80.0–100.0)
Monocytes Absolute: 0.5 10*3/uL (ref 0.1–1.0)
Monocytes Relative: 6 %
Neutro Abs: 7.8 10*3/uL — ABNORMAL HIGH (ref 1.7–7.7)
Neutrophils Relative %: 78 %
Platelet Count: 268 10*3/uL (ref 150–400)
RBC: 4.76 MIL/uL (ref 4.22–5.81)
RDW: 16.3 % — ABNORMAL HIGH (ref 11.5–15.5)
WBC Count: 9.9 10*3/uL (ref 4.0–10.5)
nRBC: 0 % (ref 0.0–0.2)

## 2022-02-07 LAB — LACTATE DEHYDROGENASE: LDH: 166 U/L (ref 98–192)

## 2022-02-07 LAB — CEA (IN HOUSE-CHCC): CEA (CHCC-In House): 1.57 ng/mL (ref 0.00–5.00)

## 2022-02-07 MED ORDER — SODIUM CHLORIDE 0.9% FLUSH
10.0000 mL | Freq: Once | INTRAVENOUS | Status: AC
  Administered 2022-02-07: 10 mL via INTRAVENOUS

## 2022-02-07 MED ORDER — HEPARIN SOD (PORK) LOCK FLUSH 100 UNIT/ML IV SOLN
500.0000 [IU] | Freq: Once | INTRAVENOUS | Status: AC
  Administered 2022-02-07: 500 [IU] via INTRAVENOUS

## 2022-02-07 NOTE — Patient Instructions (Signed)

## 2022-02-07 NOTE — Progress Notes (Signed)
Hematology and Oncology Follow Up Visit  John Douglas [id number] 01/14/1956 66 y.o. 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 twice daily Aspirin 81 mg p.o. daily Zirabev (biosimilar Avastin)/Xeloda maintenance - started 10/12/2020 -- d/c on 11/2020   Interim History:  John Douglas is here today for follow-up.  He has been doing pretty well.  We last saw him I think about 3 months ago.  He had no problems over the summer.  Recently, he was bitten by fire ants.  This was on his right ankle and foot.  The wounds look incredibly painful.  However, he says that they have been cleaned and thankfully, there is no infection.  His last CEA level was 1.6.  This is holding steady for him.  He is on Eliquis.  He is doing well on the Eliquis.  He has had no bleeding with the Eliquis.  He also takes a baby aspirin with the Eliquis.  He has had no cough or shortness of breath.  He has had no fever.  His fiance recently had COVID.  Thankfully, she was not hospitalized.  There is been no problems with rashes.  He has had no obvious change in bowel or bladder habits.  He has had no neurological issues.  Overall, I was his performance status is probably ECOG 1.    Medications:  Allergies as of 02/07/2022       Reactions   Celexa [citalopram] Other (See Comments)   Contraindicated with A-Fib   Ibuprofen Other (See Comments)   Was told to not take this    Metformin And Related Other (See Comments)   Bloody stools    Naproxen Other (See Comments)   Was told to not take this    Yellow Jacket Venom [bee Venom] Swelling, Other (See Comments)    Severe swelling where stung   Penicillins Hives   Did it involve swelling of the face/tongue/throat, SOB, or low BP? Unk Did it involve sudden or severe rash/hives, skin peeling, or any reaction on the inside of your mouth or nose? Yes Did you need to seek medical attention at a hospital or doctor's office? Unk When did it last happen? "Childhood- 55 years ago" If all above answers are "NO", may proceed with cephalosporin use.        Medication List        Accurate as of February 07, 2022  2:55 PM. If you have any questions, ask your nurse or doctor.          diltiazem 180 MG 24 hr capsule Commonly known as: CARDIZEM CD TAKE 1 CAPSULE BY MOUTH EVERY DAY   Eliquis 5 MG Tabs tablet Generic drug: apixaban TAKE 1 TABLET BY MOUTH TWICE A DAY   ezetimibe 10 MG tablet Commonly known as: ZETIA Take 1 tablet by mouth once daily   FLUoxetine 20 MG capsule Commonly known as: PROZAC Take 20 mg by mouth every morning.   fluticasone 50 MCG/ACT nasal spray Commonly known as: Flonase Place 1 spray into both nostrils 2 (two) times daily as needed (sinus congestion).   furosemide 40 MG tablet Commonly known as: LASIX TAKE 1 TABLET BY MOUTH EVERY DAY   glimepiride  1 MG tablet Commonly known as: AMARYL Take 1 mg by mouth daily.   glucose blood test strip Monitor BS before meals and at bedtime for a couple of weeks. Then may decrease to bid before meals   Klor-Con M20 20 MEQ tablet Generic drug: potassium chloride SA TAKE 2 TABLETS BY MOUTH EVERY DAY   metoprolol succinate 50 MG 24 hr tablet Commonly known as: TOPROL-XL TAKE 1 TABLET BY MOUTH DAILY. TAKE WITH OR IMMEDIATELY FOLLOWING A MEAL.   nitroGLYCERIN 0.4 MG SL tablet Commonly known as: NITROSTAT Place 1 tablet (0.4 mg total) under the tongue every 5 (five) minutes as needed for chest pain.   onetouch ultrasoft lancets Use as instructed   pramoxine-hydrocortisone 1-1 % rectal cream Commonly known as:  PROCTOCREAM-HC Place 1 application rectally 2 (two) times daily as needed for hemorrhoids or anal itching.        Allergies:  Allergies  Allergen Reactions   Celexa [Citalopram] Other (See Comments)    Contraindicated with A-Fib   Ibuprofen Other (See Comments)    Was told to not take this    Metformin And Related Other (See Comments)    Bloody stools    Naproxen Other (See Comments)    Was told to not take this    Yellow Jacket Venom [Bee Venom] Swelling and Other (See Comments)    Severe swelling where stung   Penicillins Hives    Did it involve swelling of the face/tongue/throat, SOB, or low BP? Unk Did it involve sudden or severe rash/hives, skin peeling, or any reaction on the inside of your mouth or nose? Yes Did you need to seek medical attention at a hospital or doctor's office? Unk When did it last happen? "Childhood- 55 years ago" If all above answers are "NO", may proceed with cephalosporin use.     Past Medical History, Surgical history, Social history, and Family History were reviewed and updated.  Review of Systems: . Review of Systems  Constitutional: Negative.   HENT: Negative.    Eyes: Negative.   Respiratory: Negative.    Cardiovascular: Negative.   Gastrointestinal: Negative.   Genitourinary: Negative.   Musculoskeletal:  Positive for joint pain.  Skin: Negative.   Neurological: Negative.   Endo/Heme/Allergies: Negative.   Psychiatric/Behavioral: Negative.       Physical Exam:  weight is 219 lb 1.3 oz (99.4 kg). His oral temperature is 97.6 F (36.4 C). His blood pressure is 121/99 (abnormal) and his pulse is 96. His respiration is 17 and oxygen saturation is 100%.   Wt Readings from Last 3 Encounters:  02/07/22 219 lb 1.3 oz (99.4 kg)  11/23/21 225 lb (102.1 kg)  10/05/21 221 lb (100.2 kg)    Physical Exam Vitals reviewed.  HENT:     Head: Normocephalic and atraumatic.  Eyes:     Pupils: Pupils are equal, round, and reactive to  light.  Cardiovascular:     Rate and Rhythm: Normal rate and regular rhythm.     Heart sounds: Normal heart sounds.  Pulmonary:     Effort: Pulmonary effort is normal.     Breath sounds: Normal breath sounds.  Abdominal:     General: Bowel sounds are normal.     Palpations: Abdomen is soft.  Musculoskeletal:        General: No tenderness or deformity. Normal range of motion.     Cervical back: Normal range of motion.     Comments: He does have some swelling in the fingers of  his right hand.  The main finger is his second finger.  This is quite swollen.  He does have some limited range of motion of his fingers.  It is hard for him to make a fist.  Lymphadenopathy:     Cervical: No cervical adenopathy.  Skin:    General: Skin is warm and dry.     Findings: No erythema or rash.     Comments: Has healing bite wounds on the right ankle and foot.  There are probably about 25 of these.  They are healing.  They are scarred up.  Neurological:     Mental Status: He is alert and oriented to person, place, and time.  Psychiatric:        Behavior: Behavior normal.        Thought Content: Thought content normal.        Judgment: Judgment normal.     Lab Results  Component Value Date   WBC 9.9 02/07/2022   HGB 14.5 02/07/2022   HCT 41.7 02/07/2022   MCV 87.6 02/07/2022   PLT 268 02/07/2022   Lab Results  Component Value Date   FERRITIN 374 (H) 02/14/2021   IRON 125 02/14/2021   TIBC 329 02/14/2021   UIBC 204 02/14/2021   IRONPCTSAT 38 02/14/2021   Lab Results  Component Value Date   RETICCTPCT 5.4 (H) 08/15/2020   RBC 4.76 02/07/2022   No results found for: "KPAFRELGTCHN", "LAMBDASER", "KAPLAMBRATIO" No results found for: "IGGSERUM", "IGA", "IGMSERUM" No results found for: "TOTALPROTELP", "ALBUMINELP", "A1GS", "A2GS", "BETS", "BETA2SER", "GAMS", "MSPIKE", "SPEI"   Chemistry      Component Value Date/Time   NA 142 11/23/2021 1400   NA 140 08/28/2019 0940   K 3.3 (L)  11/23/2021 1400   CL 104 11/23/2021 1400   CO2 30 11/23/2021 1400   BUN 17 11/23/2021 1400   BUN 21 08/28/2019 0940   CREATININE 1.11 11/23/2021 1400      Component Value Date/Time   CALCIUM 9.4 11/23/2021 1400   ALKPHOS 59 11/23/2021 1400   AST 13 (L) 11/23/2021 1400   ALT 12 11/23/2021 1400   BILITOT 0.8 11/23/2021 1400       Impression and Plan: John Douglas is a pleasant 66 yo caucasian gentleman with  history of CVA secondary to atrial fib and rectal bleeding. He was diagnosed with metastatic adenocarcinoma of the rectum-K-ras wild type/BRAF wild-type/HER-2 negative/MMR proficient. He had adenopathy distant to the rectum.   I am troubled by the fact that he had this fire ant incident.  I hate this for him.  I know that he has been pretty diligent with watching out for himself.  We will see what the CEA level is.  I think that as long as CEA level looks okay, we can hold off on doing a PET scan on him.  He is totally asymptomatic.  We will still plan for follow-up in 3 months.  He does have a Port-A-Cath in that we need to flush.  He will come back in 6 weeks for this.    Volanda Napoleon, MD 10/11/20232:55 PM

## 2022-02-08 ENCOUNTER — Other Ambulatory Visit: Payer: Self-pay

## 2022-02-09 ENCOUNTER — Other Ambulatory Visit: Payer: Self-pay

## 2022-02-20 ENCOUNTER — Other Ambulatory Visit: Payer: Self-pay | Admitting: Internal Medicine

## 2022-02-28 ENCOUNTER — Ambulatory Visit: Payer: Medicare HMO | Attending: Internal Medicine | Admitting: Internal Medicine

## 2022-02-28 ENCOUNTER — Encounter: Payer: Self-pay | Admitting: Internal Medicine

## 2022-02-28 VITALS — BP 130/74 | HR 62 | Ht 70.0 in | Wt 224.0 lb

## 2022-02-28 DIAGNOSIS — E1169 Type 2 diabetes mellitus with other specified complication: Secondary | ICD-10-CM

## 2022-02-28 DIAGNOSIS — I2510 Atherosclerotic heart disease of native coronary artery without angina pectoris: Secondary | ICD-10-CM | POA: Diagnosis not present

## 2022-02-28 DIAGNOSIS — I4819 Other persistent atrial fibrillation: Secondary | ICD-10-CM | POA: Diagnosis not present

## 2022-02-28 DIAGNOSIS — I10 Essential (primary) hypertension: Secondary | ICD-10-CM

## 2022-02-28 DIAGNOSIS — E785 Hyperlipidemia, unspecified: Secondary | ICD-10-CM

## 2022-02-28 DIAGNOSIS — C20 Malignant neoplasm of rectum: Secondary | ICD-10-CM

## 2022-02-28 NOTE — Patient Instructions (Addendum)
Medication Instructions:  Your physician recommends that you continue on your current medications as directed. Please refer to the Current Medication list given to you today.  *If you need a refill on your cardiac medications before your next appointment, please call your pharmacy*  Lab Work: Your physician recommends that you have fasting lipid panel done at the cancer center when you are having other lab work done.   If you have labs (blood work) drawn today and your tests are completely normal, you will receive your results only by: [place] (if you have MyChart) OR A paper copy in the mail If you have any lab test that is abnormal or we need to change your treatment, we will call you to review the results.  Testing/Procedures: None ordered today.  Follow-Up: At [facility], you and your health needs are our priority.  As part of our continuing mission to provide you with exceptional heart care, we have created designated Provider Care Teams.  These Care Teams include your primary Cardiologist (physician) and Advanced Practice Providers (APPs -  Physician Assistants and Nurse Practitioners) who all work together to provide you with the care you need, when you need it.  We recommend signing up for the patient portal called "MyChart".  Sign up information is provided on this After Visit Summary.  MyChart is used to connect with patients for Virtual Visits (Telemedicine).  Patients are able to view lab/test results, encounter notes, upcoming appointments, etc.  Non-urgent messages can be sent to your provider as well.   To learn more about what you can do with MyChart, go to [email or web address].    Your next appointment:   6 month(s)  The format for your next appointment:   In Person  Provider:   You may see Nelva Bush, MD or one of the following Advanced Practice Providers on your designated Care Team:   Murray Hodgkins, NP Christell Faith, PA-C Cadence Kathlen Mody,  PA-C Gerrie Nordmann, NP    Important Information About Sugar

## 2022-02-28 NOTE — Progress Notes (Unsigned)
Follow-up Outpatient Visit Date: 02/28/2022  Primary Care Provider: Leonides Sake, MD [place] [address]  Chief Complaint: Follow-up CAD and atrial fibrillation  HPI:  John Douglas is a 66 y.o. male with history of CAD with inferior STEMI and primary PCI to the RCA in 16/0109 complicated by sustained ventricular tachycardia in the setting of residual left coronary artery disease status post subsequent PCI to the mid LAD (06/2019), hypertension, hyperlipidemia, paroxysmal atrial fibrillation complicated by stroke in 09/2019, ischemic cardiomyopathy, type 2 diabetes mellitus, and recurrent GI bleeding with subsequent diagnosis of rectal cancer s/p neoadjuvant chemotherapy and XRT complicated by colonic stricture (undergoing surveillance with general surgery), who presents for follow-up of coronary artery disease and atrial fibrillation.  I last saw him in April, at which time John Douglas was feeling well.  We did not make any medication changes or pursue additional testing.  Today, John Douglas continues to feel well.  His only concern is that recent blood work showed mildly elevated triglycerides (he believes they were 213, though he forgot to bring the lab report with him).  He notes that he was not fasting at that time.  His PCP encouraged him to speak with [place] regarding further lipid therapy.  He has not had any chest pain, shortness of breath, palpitations, lightheadedness, focal neurologic change, or bleeding.  He received multiple fireant bites about a month ago that led to significant foot swelling, though that has now resolved.  He continues to undergo cancer surveillance with Dr. Marin Douglas.  --------------------------------------------------------------------------------------------------  Past Medical History:  Diagnosis Date   Allergic rhinitis    [facility]   Anxiety    Benign essential hypertension    [facility]   Coronary  artery disease    [facility]   Diabetic neuropathy, type II diabetes mellitus ([place])    [facility]   Dysrhythmia    Iron deficiency anemia due to chronic blood loss 11/18/2019   Mixed hyperlipidemia    [facility]   Myocardial infarct [facility])    [place]   Obesity, unspecified    [facility]   Persistent atrial fibrillation [facility])    Rectal bleeding    [facility]   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 ([place]) 11/17/2019   Stroke ([place])    Wide-complex tachycardia 06/04/2019   Past Surgical History:  Procedure Laterality Date   BIOPSY  10/07/2019   Procedure: BIOPSY;  Surgeon: John Bullion, DO;  Location: [facility];  Service: Gastroenterology;;   COLONOSCOPY WITH PROPOFOL N/A 10/07/2019   Procedure: COLONOSCOPY WITH PROPOFOL;  Surgeon: John Bullion, DO;  Location: [facility];  Service: Gastroenterology;  Laterality: N/A;   CORONARY ANGIOPLASTY     3 stents   IR IMAGING GUIDED PORT INSERTION  11/24/2019   LEFT HEART CATH AND CORONARY ANGIOGRAPHY N/A 06/05/2019   Procedure: LEFT HEART CATH AND CORONARY ANGIOGRAPHY;  Surgeon: Nelva Bush, MD;  Location: [facility];  Service: Cardiovascular;  Laterality: N/A;   POLYPECTOMY  10/07/2019   Procedure: POLYPECTOMY;  Surgeon: John Bullion, DO;  Location: [facility];  Service: Gastroenterology;;   SUBMUCOSAL TATTOO INJECTION  10/07/2019   Procedure: SUBMUCOSAL TATTOO INJECTION;  Surgeon: John Bullion, DO;  Location: [facility];  Service: Gastroenterology;;    Current Meds  Medication Sig   diltiazem (CARDIZEM CD) 180 MG 24 hr capsule TAKE 1  CAPSULE BY MOUTH EVERY DAY   ELIQUIS 5 MG TABS tablet TAKE 1 TABLET BY MOUTH TWICE A DAY   ezetimibe (ZETIA) 10 MG tablet Take 1 tablet by mouth once daily   fluticasone (FLONASE) 50 MCG/ACT nasal spray Place 1 spray into both nostrils  2 (two) times daily as needed (sinus congestion).   furosemide (LASIX) 40 MG tablet TAKE 1 TABLET BY MOUTH EVERY DAY   glimepiride (AMARYL) 1 MG tablet Take 1 mg by mouth daily.   glucose blood test strip Monitor BS before meals and at bedtime for a couple of weeks. Then may decrease to bid before meals   KLOR-CON M20 20 MEQ tablet TAKE 2 TABLETS BY MOUTH EVERY DAY   Lancets (ONETOUCH ULTRASOFT) lancets Use as instructed   metoprolol succinate (TOPROL-XL) 50 MG 24 hr tablet TAKE 1 TABLET BY MOUTH DAILY. TAKE WITH OR IMMEDIATELY FOLLOWING A MEAL.   nitroGLYCERIN (NITROSTAT) 0.4 MG SL tablet Place 1 tablet (0.4 mg total) under the tongue every 5 (five) minutes as needed for chest pain.   pramoxine-hydrocortisone (PROCTOCREAM-HC) 1-1 % rectal cream Place 1 application rectally 2 (two) times daily as needed for hemorrhoids or anal itching.    Allergies: Celexa [citalopram], Ibuprofen, Metformin and related, Naproxen, Yellow jacket venom [bee venom], and Penicillins  Social History   Tobacco Use   Smoking status: Never   Smokeless tobacco: Former    Types: Chew    Quit date: 05/04/2019  Vaping Use   Vaping Use: Never used  Substance Use Topics   Alcohol use: Not Currently    Comment: occasional   Drug use: Yes    Types: Marijuana    Comment: occassionally    Family History  Problem Relation Age of Onset   Diverticulitis Mother    Hypertension Father    Heart disease Father        MI   Prostate cancer Father    Colon cancer Neg Hx    Stomach cancer Neg Hx    Pancreatic cancer Neg Hx    Rectal cancer Neg Hx     Review of Systems: A 12-system review of systems was performed and was negative except as noted in the HPI.  --------------------------------------------------------------------------------------------------  Physical Exam: BP 130/74 (BP Location: Left Arm, Patient Position: Sitting, Cuff Size: Large)   Pulse 62   Ht '5\' 10"'$  (1.778 m)   Wt 224 lb (101.6 kg)   SpO2  98%   BMI 32.14 kg/m   General:  NAD. Neck: No JVD or HJR. Lungs: Clear to auscultation bilaterally without wheezes or crackles. Heart: Regular rate and rhythm without murmurs, rubs, or gallops. Abdomen: Soft, nontender, nondistended. Extremities: No lower extremity edema.  EKG:  Normal sinus rhythm with inferior infarct and nonspecific T wave changes.  No significant change from prior tracing on 08/24/2021.  Lab Results  Component Value Date   WBC 9.9 02/07/2022   HGB 14.5 02/07/2022   HCT 41.7 02/07/2022   MCV 87.6 02/07/2022   PLT 268 02/07/2022    Lab Results  Component Value Date   NA 140 02/07/2022   K 3.5 02/07/2022   CL 103 02/07/2022   CO2 27 02/07/2022   BUN 38 (H) 02/07/2022   CREATININE 1.38 (H) 02/07/2022   GLUCOSE 233 (H) 02/07/2022   ALT 12 02/07/2022    Lab Results  Component Value Date   CHOL 148 10/09/2019   HDL 26 (L) 10/09/2019   LDLCALC 98 10/09/2019   TRIG 119 10/09/2019  CHOLHDL 5.7 10/09/2019    --------------------------------------------------------------------------------------------------  ASSESSMENT AND PLAN: Coronary artery disease: No angina reported.  Continue current medications for secondary prevention, including apixaban and lieu of aspirin given history of persistent atrial fibrillation.  Persistent atrial fibrillation: John Douglas is maintaining sinus rhythm without symptoms to suggest recurrent atrial fibrillation.  We will plan to continue indefinite anticoagulation with apixaban.  Heart rate is reasonable today; continue current doses of diltiazem and metoprolol.  Hypertension: Blood pressure upper normal today.  No medication changes.  Hyperlipidemia associated with type 2 diabetes mellitus: John Douglas brother reports that his most recent lipid panel showed well-controlled LDL but mildly elevated triglycerides.  Should be noted that he was not fasting at that time.  We discussed addition of Vascepa for secondary prevention,  though he is concerned about cost of additional medications.  We have therefore agreed to obtain a fasting lipid panel when he has his next port flush.  We will defer medication changes for now, continuing ezetimibe 10 mg daily given history of statin intolerance.  Rectal cancer: No evidence of recurrence.  Continue surveillance with Dr. Marin Douglas.  Follow-up: Return to clinic in 6 months  Nelva Bush, MD 02/28/2022 4:11 PM

## 2022-03-01 ENCOUNTER — Other Ambulatory Visit: Payer: Self-pay

## 2022-03-01 ENCOUNTER — Encounter: Payer: Self-pay | Admitting: Internal Medicine

## 2022-03-12 ENCOUNTER — Other Ambulatory Visit: Payer: Self-pay | Admitting: Internal Medicine

## 2022-03-12 DIAGNOSIS — I4891 Unspecified atrial fibrillation: Secondary | ICD-10-CM

## 2022-03-27 ENCOUNTER — Inpatient Hospital Stay: Payer: Medicare HMO | Attending: Hematology & Oncology

## 2022-03-27 VITALS — BP 130/68 | HR 65 | Temp 98.2°F | Resp 18

## 2022-03-27 DIAGNOSIS — Z85048 Personal history of other malignant neoplasm of rectum, rectosigmoid junction, and anus: Secondary | ICD-10-CM | POA: Diagnosis present

## 2022-03-27 DIAGNOSIS — Z95828 Presence of other vascular implants and grafts: Secondary | ICD-10-CM

## 2022-03-27 DIAGNOSIS — Z452 Encounter for adjustment and management of vascular access device: Secondary | ICD-10-CM | POA: Insufficient documentation

## 2022-03-27 MED ORDER — HEPARIN SOD (PORK) LOCK FLUSH 100 UNIT/ML IV SOLN
500.0000 [IU] | Freq: Once | INTRAVENOUS | Status: AC
  Administered 2022-03-27: 500 [IU] via INTRAVENOUS

## 2022-03-27 MED ORDER — SODIUM CHLORIDE 0.9% FLUSH
10.0000 mL | Freq: Once | INTRAVENOUS | Status: AC
  Administered 2022-03-27: 10 mL via INTRAVENOUS

## 2022-03-27 NOTE — Patient Instructions (Signed)

## 2022-04-11 ENCOUNTER — Other Ambulatory Visit: Payer: Self-pay | Admitting: Hematology & Oncology

## 2022-04-19 ENCOUNTER — Other Ambulatory Visit: Payer: Self-pay | Admitting: Internal Medicine

## 2022-05-10 ENCOUNTER — Inpatient Hospital Stay: Payer: Medicare HMO | Admitting: Hematology & Oncology

## 2022-05-10 ENCOUNTER — Inpatient Hospital Stay: Payer: Medicare HMO | Attending: Hematology & Oncology

## 2022-05-10 ENCOUNTER — Inpatient Hospital Stay: Payer: Medicare HMO

## 2022-05-10 ENCOUNTER — Encounter: Payer: Self-pay | Admitting: Hematology & Oncology

## 2022-05-10 ENCOUNTER — Other Ambulatory Visit: Payer: Self-pay

## 2022-05-10 VITALS — BP 135/86 | HR 71 | Temp 98.2°F | Resp 18 | Ht 70.0 in | Wt 220.0 lb

## 2022-05-10 DIAGNOSIS — D500 Iron deficiency anemia secondary to blood loss (chronic): Secondary | ICD-10-CM

## 2022-05-10 DIAGNOSIS — C20 Malignant neoplasm of rectum: Secondary | ICD-10-CM

## 2022-05-10 DIAGNOSIS — I4891 Unspecified atrial fibrillation: Secondary | ICD-10-CM | POA: Insufficient documentation

## 2022-05-10 DIAGNOSIS — Z08 Encounter for follow-up examination after completed treatment for malignant neoplasm: Secondary | ICD-10-CM | POA: Insufficient documentation

## 2022-05-10 DIAGNOSIS — Z7901 Long term (current) use of anticoagulants: Secondary | ICD-10-CM | POA: Diagnosis not present

## 2022-05-10 DIAGNOSIS — Z85048 Personal history of other malignant neoplasm of rectum, rectosigmoid junction, and anus: Secondary | ICD-10-CM | POA: Diagnosis present

## 2022-05-10 DIAGNOSIS — Z7982 Long term (current) use of aspirin: Secondary | ICD-10-CM | POA: Insufficient documentation

## 2022-05-10 LAB — CMP (CANCER CENTER ONLY)
ALT: 16 U/L (ref 0–44)
AST: 14 U/L — ABNORMAL LOW (ref 15–41)
Albumin: 4.4 g/dL (ref 3.5–5.0)
Alkaline Phosphatase: 69 U/L (ref 38–126)
Anion gap: 10 (ref 5–15)
BUN: 18 mg/dL (ref 8–23)
CO2: 29 mmol/L (ref 22–32)
Calcium: 9.5 mg/dL (ref 8.9–10.3)
Chloride: 102 mmol/L (ref 98–111)
Creatinine: 1.14 mg/dL (ref 0.61–1.24)
GFR, Estimated: >60 mL/min (ref >60)
Glucose, Bld: 139 mg/dL — ABNORMAL HIGH (ref 70–99)
Potassium: 3.5 mmol/L (ref 3.5–5.1)
Sodium: 141 mmol/L (ref 135–145)
Total Bilirubin: 0.8 mg/dL (ref 0.3–1.2)
Total Protein: 7.2 g/dL (ref 6.5–8.1)

## 2022-05-10 LAB — CBC WITH DIFFERENTIAL (CANCER CENTER ONLY)
Abs Immature Granulocytes: 0.05 10*3/uL (ref 0.00–0.07)
Basophils Absolute: 0.1 10*3/uL (ref 0.0–0.1)
Basophils Relative: 1 %
Eosinophils Absolute: 0.3 10*3/uL (ref 0.0–0.5)
Eosinophils Relative: 3 %
HCT: 39.7 % (ref 39.0–52.0)
Hemoglobin: 13.5 g/dL (ref 13.0–17.0)
Immature Granulocytes: 1 %
Lymphocytes Relative: 17 %
Lymphs Abs: 1.8 10*3/uL (ref 0.7–4.0)
MCH: 29.5 pg (ref 26.0–34.0)
MCHC: 34 g/dL (ref 30.0–36.0)
MCV: 86.9 fL (ref 80.0–100.0)
Monocytes Absolute: 0.7 10*3/uL (ref 0.1–1.0)
Monocytes Relative: 7 %
Neutro Abs: 7.7 10*3/uL (ref 1.7–7.7)
Neutrophils Relative %: 71 %
Platelet Count: 196 10*3/uL (ref 150–400)
RBC: 4.57 MIL/uL (ref 4.22–5.81)
RDW: 17 % — ABNORMAL HIGH (ref 11.5–15.5)
WBC Count: 10.5 10*3/uL (ref 4.0–10.5)
nRBC: 0 % (ref 0.0–0.2)

## 2022-05-10 LAB — LACTATE DEHYDROGENASE: LDH: 182 U/L (ref 98–192)

## 2022-05-10 LAB — CEA (IN HOUSE-CHCC): CEA (CHCC-In House): 1.75 ng/mL (ref 0.00–5.00)

## 2022-05-10 NOTE — Patient Instructions (Signed)

## 2022-05-10 NOTE — Progress Notes (Signed)
Hematology and Oncology Follow Up Visit  John Douglas [id number] 1956-02-12 67 y.o. 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 twice daily Aspirin 81 mg p.o. daily Zirabev (biosimilar Avastin)/Xeloda maintenance - started 10/12/2020 -- d/c on 11/2020   Interim History:  John Douglas is here today for follow-up.  We last saw him back in October.  At that time, his CEA level was holding steady at 1.57.  He is feeling well.  He has gained some weight over the holidays.  He has had no problems with bowels or bladder.  He has had no cough or shortness of breath.  He has arthritis in his hands.  He has had no rashes.  There is been no bleeding.  His last PET scan was done back in May.  We really held off on this unless we see a rise in his CEA.  He has had no fever.  He has been avoiding COVID.  There is been no problems with headache.  He has had no issues with his heart.  He is back in sinus rhythm.  Overall, I would say his performance status is probably ECOG is 0.    Medications:  Allergies as of 05/10/2022       Reactions   Celexa [citalopram] Other (See Comments)   Contraindicated with A-Fib   Ibuprofen Other (See Comments)   Was told to not take this    Metformin And Related Other (See Comments)   Bloody stools    Naproxen Other (See Comments)   Was told to not take this    Yellow Jacket Venom [bee Venom] Swelling, Other (See Comments)   Severe swelling where stung   Penicillins Hives   Did it involve swelling of the face/tongue/throat, SOB, or low BP? Unk Did it involve sudden or severe rash/hives, skin peeling, or  any reaction on the inside of your mouth or nose? Yes Did you need to seek medical attention at a hospital or doctor's office? Unk When did it last happen? "Childhood- 55 years ago" If all above answers are "NO", may proceed with cephalosporin use.        Medication List        Accurate as of May 10, 2022  3:29 PM. If you have any questions, ask your nurse or doctor.          diltiazem 180 MG 24 hr capsule Commonly known as: CARDIZEM CD TAKE 1 CAPSULE BY MOUTH EVERY DAY   Eliquis 5 MG Tabs tablet Generic drug: apixaban TAKE 1 TABLET BY MOUTH TWICE A DAY   ezetimibe 10 MG tablet Commonly known as: ZETIA Take 1 tablet by mouth once daily   fluticasone 50 MCG/ACT nasal spray Commonly known as: Flonase Place 1 spray into both nostrils 2 (two) times daily as needed (sinus congestion).   furosemide 40 MG tablet Commonly known as: LASIX TAKE 1 TABLET BY MOUTH EVERY DAY   glimepiride 1 MG tablet Commonly known as: AMARYL Take 1 mg by mouth daily.   glucose blood test strip Monitor BS before meals and at bedtime for a couple of weeks. Then may decrease to bid before meals   Klor-Con M20 20 MEQ tablet Generic  drug: potassium chloride SA TAKE 2 TABLETS BY MOUTH EVERY DAY   metoprolol succinate 50 MG 24 hr tablet Commonly known as: TOPROL-XL TAKE 1 TABLET BY MOUTH DAILY. TAKE WITH OR IMMEDIATELY FOLLOWING A MEAL.   nitroGLYCERIN 0.4 MG SL tablet Commonly known as: NITROSTAT Place 1 tablet (0.4 mg total) under the tongue every 5 (five) minutes as needed for chest pain.   onetouch ultrasoft lancets Use as instructed   pramoxine-hydrocortisone 1-1 % rectal cream Commonly known as: PROCTOCREAM-HC Place 1 application rectally 2 (two) times daily as needed for hemorrhoids or anal itching.        Allergies:  Allergies  Allergen Reactions   Celexa [Citalopram] Other (See Comments)    Contraindicated with A-Fib   Ibuprofen Other (See Comments)    Was told to  not take this    Metformin And Related Other (See Comments)    Bloody stools    Naproxen Other (See Comments)    Was told to not take this    Yellow Jacket Venom [Bee Venom] Swelling and Other (See Comments)    Severe swelling where stung   Penicillins Hives    Did it involve swelling of the face/tongue/throat, SOB, or low BP? Unk Did it involve sudden or severe rash/hives, skin peeling, or any reaction on the inside of your mouth or nose? Yes Did you need to seek medical attention at a hospital or doctor's office? Unk When did it last happen? "Childhood- 53 years ago" If all above answers are "NO", may proceed with cephalosporin use.     Past Medical History, Surgical history, Social history, and Family History were reviewed and updated.  Review of Systems: . Review of Systems  Constitutional: Negative.   HENT: Negative.    Eyes: Negative.   Respiratory: Negative.    Cardiovascular: Negative.   Gastrointestinal: Negative.   Genitourinary: Negative.   Musculoskeletal:  Positive for joint pain.  Skin: Negative.   Neurological: Negative.   Endo/Heme/Allergies: Negative.   Psychiatric/Behavioral: Negative.       Physical Exam:  height is '5\' 10"'$  (1.778 m) and weight is 220 lb (99.8 kg). His oral temperature is 98.2 F (36.8 C). His blood pressure is 135/86 and his pulse is 71. His respiration is 18 and oxygen saturation is 100%.   Wt Readings from Last 3 Encounters:  05/10/22 220 lb (99.8 kg)  02/28/22 224 lb (101.6 kg)  02/07/22 219 lb 1.3 oz (99.4 kg)    Physical Exam Vitals reviewed.  HENT:     Head: Normocephalic and atraumatic.  Eyes:     Pupils: Pupils are equal, round, and reactive to light.  Cardiovascular:     Rate and Rhythm: Normal rate and regular rhythm.     Heart sounds: Normal heart sounds.  Pulmonary:     Effort: Pulmonary effort is normal.     Breath sounds: Normal breath sounds.  Abdominal:     General: Bowel sounds are normal.     Palpations:  Abdomen is soft.  Musculoskeletal:        General: No tenderness or deformity. Normal range of motion.     Cervical back: Normal range of motion.     Comments: He does have some swelling in the fingers of his right hand.  The main finger is his second finger.  This is quite swollen.  He does have some limited range of motion of his fingers.  It is hard for him to make a fist.  Lymphadenopathy:  Cervical: No cervical adenopathy.  Skin:    General: Skin is warm and dry.     Findings: No erythema or rash.  Neurological:     Mental Status: He is alert and oriented to person, place, and time.  Psychiatric:        Behavior: Behavior normal.        Thought Content: Thought content normal.        Judgment: Judgment normal.     Lab Results  Component Value Date   WBC 10.5 05/10/2022   HGB 13.5 05/10/2022   HCT 39.7 05/10/2022   MCV 86.9 05/10/2022   PLT 196 05/10/2022   Lab Results  Component Value Date   FERRITIN 374 (H) 02/14/2021   IRON 125 02/14/2021   TIBC 329 02/14/2021   UIBC 204 02/14/2021   IRONPCTSAT 38 02/14/2021   Lab Results  Component Value Date   RETICCTPCT 5.4 (H) 08/15/2020   RBC 4.57 05/10/2022   No results found for: "KPAFRELGTCHN", "LAMBDASER", "KAPLAMBRATIO" No results found for: "IGGSERUM", "IGA", "IGMSERUM" No results found for: "TOTALPROTELP", "ALBUMINELP", "A1GS", "A2GS", "BETS", "BETA2SER", "GAMS", "MSPIKE", "SPEI"   Chemistry      Component Value Date/Time   NA 141 05/10/2022 1430   NA 140 08/28/2019 0940   K 3.5 05/10/2022 1430   CL 102 05/10/2022 1430   CO2 29 05/10/2022 1430   BUN 18 05/10/2022 1430   BUN 21 08/28/2019 0940   CREATININE 1.14 05/10/2022 1430      Component Value Date/Time   CALCIUM 9.5 05/10/2022 1430   ALKPHOS 69 05/10/2022 1430   AST 14 (L) 05/10/2022 1430   ALT 16 05/10/2022 1430   BILITOT 0.8 05/10/2022 1430       Impression and Plan: Mr. Logan is a pleasant 67 yo caucasian gentleman with  history of CVA  secondary to atrial fib and rectal bleeding. He was diagnosed with metastatic adenocarcinoma of the rectum -K-ras wild type/BRAF wild-type/HER-2 negative/MMR proficient. He had adenopathy distant to the rectum.   He really looks quite good.  We will see what his CEA level is.  I would like to hope that this will still be normal.  As long as the CEA level is normal, I do not see need for putting her through any scans.  He still has a Port-A-Cath in.  We will continue to flushes every 6 weeks.  Will plan to get him back to see [place] in another 3 months.    Volanda Napoleon, MD 1/11/20243:29 PM

## 2022-05-11 ENCOUNTER — Encounter: Payer: Self-pay | Admitting: *Deleted

## 2022-06-03 ENCOUNTER — Other Ambulatory Visit: Payer: Self-pay | Admitting: Internal Medicine

## 2022-06-07 ENCOUNTER — Encounter (HOSPITAL_COMMUNITY): Payer: Self-pay | Admitting: *Deleted

## 2022-06-21 ENCOUNTER — Inpatient Hospital Stay: Payer: Medicare HMO | Attending: Hematology & Oncology

## 2022-06-21 VITALS — BP 147/81 | HR 67 | Temp 98.3°F | Resp 18

## 2022-06-21 DIAGNOSIS — Z85048 Personal history of other malignant neoplasm of rectum, rectosigmoid junction, and anus: Secondary | ICD-10-CM | POA: Insufficient documentation

## 2022-06-21 DIAGNOSIS — Z452 Encounter for adjustment and management of vascular access device: Secondary | ICD-10-CM | POA: Insufficient documentation

## 2022-06-21 DIAGNOSIS — Z95828 Presence of other vascular implants and grafts: Secondary | ICD-10-CM

## 2022-06-21 MED ORDER — HEPARIN SOD (PORK) LOCK FLUSH 100 UNIT/ML IV SOLN
500.0000 [IU] | Freq: Once | INTRAVENOUS | Status: AC
  Administered 2022-06-21: 500 [IU] via INTRAVENOUS

## 2022-06-21 MED ORDER — SODIUM CHLORIDE 0.9% FLUSH
10.0000 mL | INTRAVENOUS | Status: DC | PRN
  Administered 2022-06-21: 10 mL via INTRAVENOUS

## 2022-08-09 ENCOUNTER — Other Ambulatory Visit: Payer: Self-pay

## 2022-08-09 ENCOUNTER — Encounter: Payer: Self-pay | Admitting: Hematology & Oncology

## 2022-08-09 ENCOUNTER — Inpatient Hospital Stay: Payer: Medicare HMO

## 2022-08-09 ENCOUNTER — Inpatient Hospital Stay: Payer: Medicare HMO | Admitting: Hematology & Oncology

## 2022-08-09 ENCOUNTER — Inpatient Hospital Stay: Payer: Medicare HMO | Attending: Hematology & Oncology

## 2022-08-09 VITALS — BP 149/76 | HR 60 | Temp 98.1°F | Resp 18 | Ht 70.0 in | Wt 224.0 lb

## 2022-08-09 DIAGNOSIS — Z08 Encounter for follow-up examination after completed treatment for malignant neoplasm: Secondary | ICD-10-CM | POA: Insufficient documentation

## 2022-08-09 DIAGNOSIS — C20 Malignant neoplasm of rectum: Secondary | ICD-10-CM

## 2022-08-09 DIAGNOSIS — Z7982 Long term (current) use of aspirin: Secondary | ICD-10-CM | POA: Diagnosis not present

## 2022-08-09 DIAGNOSIS — Z85048 Personal history of other malignant neoplasm of rectum, rectosigmoid junction, and anus: Secondary | ICD-10-CM | POA: Diagnosis present

## 2022-08-09 DIAGNOSIS — Z7901 Long term (current) use of anticoagulants: Secondary | ICD-10-CM | POA: Diagnosis not present

## 2022-08-09 DIAGNOSIS — I4891 Unspecified atrial fibrillation: Secondary | ICD-10-CM | POA: Diagnosis not present

## 2022-08-09 LAB — CEA (IN HOUSE-CHCC): CEA (CHCC-In House): 2.2 ng/mL (ref 0.00–5.00)

## 2022-08-09 LAB — CBC WITH DIFFERENTIAL (CANCER CENTER ONLY)
Abs Immature Granulocytes: 0.05 10*3/uL (ref 0.00–0.07)
Basophils Absolute: 0.1 10*3/uL (ref 0.0–0.1)
Basophils Relative: 1 %
Eosinophils Absolute: 0.2 10*3/uL (ref 0.0–0.5)
Eosinophils Relative: 2 %
HCT: 39.7 % (ref 39.0–52.0)
Hemoglobin: 13.6 g/dL (ref 13.0–17.0)
Immature Granulocytes: 1 %
Lymphocytes Relative: 18 %
Lymphs Abs: 1.7 10*3/uL (ref 0.7–4.0)
MCH: 29.9 pg (ref 26.0–34.0)
MCHC: 34.3 g/dL (ref 30.0–36.0)
MCV: 87.3 fL (ref 80.0–100.0)
Monocytes Absolute: 0.6 10*3/uL (ref 0.1–1.0)
Monocytes Relative: 6 %
Neutro Abs: 6.9 10*3/uL (ref 1.7–7.7)
Neutrophils Relative %: 72 %
Platelet Count: 224 10*3/uL (ref 150–400)
RBC: 4.55 MIL/uL (ref 4.22–5.81)
RDW: 16.2 % — ABNORMAL HIGH (ref 11.5–15.5)
WBC Count: 9.5 10*3/uL (ref 4.0–10.5)
nRBC: 0 % (ref 0.0–0.2)

## 2022-08-09 LAB — CMP (CANCER CENTER ONLY)
ALT: 12 U/L (ref 0–44)
AST: 12 U/L — ABNORMAL LOW (ref 15–41)
Albumin: 4.3 g/dL (ref 3.5–5.0)
Alkaline Phosphatase: 64 U/L (ref 38–126)
Anion gap: 9 (ref 5–15)
BUN: 22 mg/dL (ref 8–23)
CO2: 28 mmol/L (ref 22–32)
Calcium: 9.2 mg/dL (ref 8.9–10.3)
Chloride: 104 mmol/L (ref 98–111)
Creatinine: 1.22 mg/dL (ref 0.61–1.24)
GFR, Estimated: >60 mL/min (ref >60)
Glucose, Bld: 106 mg/dL — ABNORMAL HIGH (ref 70–99)
Potassium: 3.6 mmol/L (ref 3.5–5.1)
Sodium: 141 mmol/L (ref 135–145)
Total Bilirubin: 0.7 mg/dL (ref 0.3–1.2)
Total Protein: 7.1 g/dL (ref 6.5–8.1)

## 2022-08-09 LAB — LIPID PANEL
Cholesterol: 173 (ref 0–200)
HDL: 31 — AB (ref 35–70)
LDL Cholesterol: 104
LDl/HDL Ratio: 3.4
Triglycerides: 221 — AB (ref 40–160)

## 2022-08-09 LAB — LACTATE DEHYDROGENASE: LDH: 188 U/L (ref 98–192)

## 2022-08-09 LAB — PSA: PSA: 1.8

## 2022-08-09 NOTE — Progress Notes (Signed)
Hematology and Oncology Follow Up Visit  John Douglas [id number] 1955-10-14 67 y.o. 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 twice daily Aspirin 81 mg p.o. daily Zirabev (biosimilar Avastin)/Xeloda maintenance - started 10/12/2020 -- d/c on 11/2020   Interim History:  John Douglas is here today for follow-up.  We last saw him back in January.  At that time, his CEA level was 1.75.  He is doing quite nicely.  He is still bothered by arthritis.  He has had no problems with nausea or vomiting.  There is no change in bowel or bladder habits.  He has had no cough or shortness of breath.  There is no swollen lymph nodes.  He has had no leg swelling.  He is on blood thinner.  He is on Eliquis.  He has had no bleeding.  There is no problems with pain.  He has been quite active.  He is on his tractor.  He is getting ready to plant a lot of wild flowers that he used for a wedding celebrations.  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  He is quite busy with this.  Currently, I would say that his performance status is probably ECOG 1.    Medications:  Allergies as of 08/09/2022       Reactions   Celexa [citalopram] Other (See Comments)   Contraindicated with A-Fib   Ibuprofen Other (See Comments)   Was told to not take this    Metformin And Related Other (See Comments)   Bloody stools    Naproxen Other (See Comments)   Was told to not take this    Yellow Jacket Venom [bee Venom] Swelling, Other (See Comments)   Severe swelling where stung   Penicillins Hives   Did it involve swelling of the face/tongue/throat, SOB, or low BP?  Unk Did it involve sudden or severe rash/hives, skin peeling, or any reaction on the inside of your mouth or nose? Yes Did you need to seek medical attention at a hospital or doctor's office? Unk When did it last happen? "Childhood- 55 years ago" If all above answers are "NO", may proceed with cephalosporin use.        Medication List        Accurate as of August 09, 2022  2:41 PM. If you have any questions, ask your nurse or doctor.          diltiazem 180 MG 24 hr capsule Commonly known as: CARDIZEM CD TAKE 1 CAPSULE BY MOUTH EVERY DAY   Eliquis 5 MG Tabs tablet Generic drug: apixaban TAKE 1 TABLET BY MOUTH TWICE A DAY   ezetimibe 10 MG tablet Commonly known as: ZETIA Take 1 tablet by mouth once daily   fluticasone 50 MCG/ACT nasal spray Commonly known as: Flonase Place 1 spray into both nostrils 2 (two) times daily as needed (sinus congestion).   furosemide 40 MG tablet Commonly known as: LASIX TAKE 1 TABLET BY MOUTH EVERY DAY   glimepiride 1 MG tablet Commonly known as: AMARYL Take 1 mg by mouth daily.   glucose blood test strip Monitor BS before meals and at bedtime for a couple of weeks. Then may decrease to  bid before meals   Klor-Con M20 20 MEQ tablet Generic drug: potassium chloride SA TAKE 2 TABLETS BY MOUTH EVERY DAY   metoprolol succinate 50 MG 24 hr tablet Commonly known as: TOPROL-XL TAKE 1 TABLET BY MOUTH DAILY. TAKE WITH OR IMMEDIATELY FOLLOWING A MEAL.   nitroGLYCERIN 0.4 MG SL tablet Commonly known as: NITROSTAT Place 1 tablet (0.4 mg total) under the tongue every 5 (five) minutes as needed for chest pain.   onetouch ultrasoft lancets Use as instructed   pramoxine-hydrocortisone 1-1 % rectal cream Commonly known as: PROCTOCREAM-HC Place 1 application rectally 2 (two) times daily as needed for hemorrhoids or anal itching.        Allergies:  Allergies  Allergen Reactions   Celexa [Citalopram] Other (See Comments)     Contraindicated with A-Fib   Ibuprofen Other (See Comments)    Was told to not take this    Metformin And Related Other (See Comments)    Bloody stools    Naproxen Other (See Comments)    Was told to not take this    Yellow Jacket Venom [Bee Venom] Swelling and Other (See Comments)    Severe swelling where stung   Penicillins Hives    Did it involve swelling of the face/tongue/throat, SOB, or low BP? Unk Did it involve sudden or severe rash/hives, skin peeling, or any reaction on the inside of your mouth or nose? Yes Did you need to seek medical attention at a hospital or doctor's office? Unk When did it last happen? "Childhood- 55 years ago" If all above answers are "NO", may proceed with cephalosporin use.     Past Medical History, Surgical history, Social history, and Family History were reviewed and updated.  Review of Systems: . Review of Systems  Constitutional: Negative.   HENT: Negative.    Eyes: Negative.   Respiratory: Negative.    Cardiovascular: Negative.   Gastrointestinal: Negative.   Genitourinary: Negative.   Musculoskeletal:  Positive for joint pain.  Skin: Negative.   Neurological: Negative.   Endo/Heme/Allergies: Negative.   Psychiatric/Behavioral: Negative.       Physical Exam:  height is 5\' 10"  (1.778 m) and weight is 224 lb (101.6 kg). His oral temperature is 98.1 F (36.7 C). His blood pressure is 149/76 (abnormal) and his pulse is 60. His respiration is 18 and oxygen saturation is 100%.   Wt Readings from Last 3 Encounters:  08/09/22 224 lb (101.6 kg)  05/10/22 220 lb (99.8 kg)  02/28/22 224 lb (101.6 kg)    Physical Exam Vitals reviewed.  HENT:     Head: Normocephalic and atraumatic.  Eyes:     Pupils: Pupils are equal, round, and reactive to light.  Cardiovascular:     Rate and Rhythm: Normal rate and regular rhythm.     Heart sounds: Normal heart sounds.  Pulmonary:     Effort: Pulmonary effort is normal.     Breath sounds: Normal  breath sounds.  Abdominal:     General: Bowel sounds are normal.     Palpations: Abdomen is soft.  Musculoskeletal:        General: No tenderness or deformity. Normal range of motion.     Cervical back: Normal range of motion.     Comments: He does have some swelling in the fingers of his right hand.  The main finger is his second finger.  This is quite swollen.  He does have some limited range of motion of his fingers.  It is hard  for him to make a fist.  Lymphadenopathy:     Cervical: No cervical adenopathy.  Skin:    General: Skin is warm and dry.     Findings: No erythema or rash.  Neurological:     Mental Status: He is alert and oriented to person, place, and time.  Psychiatric:        Behavior: Behavior normal.        Thought Content: Thought content normal.        Judgment: Judgment normal.    Lab Results  Component Value Date   WBC 9.5 08/09/2022   HGB 13.6 08/09/2022   HCT 39.7 08/09/2022   MCV 87.3 08/09/2022   PLT 224 08/09/2022   Lab Results  Component Value Date   FERRITIN 374 (H) 02/14/2021   IRON 125 02/14/2021   TIBC 329 02/14/2021   UIBC 204 02/14/2021   IRONPCTSAT 38 02/14/2021   Lab Results  Component Value Date   RETICCTPCT 5.4 (H) 08/15/2020   RBC 4.55 08/09/2022   No results found for: "KPAFRELGTCHN", "LAMBDASER", "KAPLAMBRATIO" No results found for: "IGGSERUM", "IGA", "IGMSERUM" No results found for: "TOTALPROTELP", "ALBUMINELP", "A1GS", "A2GS", "BETS", "BETA2SER", "GAMS", "MSPIKE", "SPEI"   Chemistry      Component Value Date/Time   NA 141 08/09/2022 1310   NA 140 08/28/2019 0940   K 3.6 08/09/2022 1310   CL 104 08/09/2022 1310   CO2 28 08/09/2022 1310   BUN 22 08/09/2022 1310   BUN 21 08/28/2019 0940   CREATININE 1.22 08/09/2022 1310      Component Value Date/Time   CALCIUM 9.2 08/09/2022 1310   ALKPHOS 64 08/09/2022 1310   AST 12 (L) 08/09/2022 1310   ALT 12 08/09/2022 1310   BILITOT 0.7 08/09/2022 1310       Impression and  Plan: John Douglas is a pleasant 67 yo caucasian gentleman with  history of CVA secondary to atrial fib and rectal bleeding. He was diagnosed with metastatic adenocarcinoma of the rectum -K-ras wild type/BRAF wild-type/HER-2 negative/MMR proficient. He had adenopathy distant to the rectum.   He really looks quite good.  We will see what his CEA level is.  I would like to hope that this will still be normal.  As long as the CEA level is normal, I do not see need for putting her through any scans.  He still has a Port-A-Cath in.  We will continue to flushes every 6 weeks.  Will plan to get him back to see [place] in another 3 months.    Josph Macho, MD 4/11/20242:41 PM

## 2022-08-09 NOTE — Patient Instructions (Signed)

## 2022-08-31 ENCOUNTER — Encounter: Payer: Self-pay | Admitting: Internal Medicine

## 2022-08-31 ENCOUNTER — Ambulatory Visit: Payer: Medicare HMO | Attending: Internal Medicine | Admitting: Internal Medicine

## 2022-08-31 VITALS — BP 130/80 | HR 90 | Ht 70.0 in | Wt 225.0 lb

## 2022-08-31 DIAGNOSIS — E1169 Type 2 diabetes mellitus with other specified complication: Secondary | ICD-10-CM | POA: Diagnosis not present

## 2022-08-31 DIAGNOSIS — I480 Paroxysmal atrial fibrillation: Secondary | ICD-10-CM

## 2022-08-31 DIAGNOSIS — I2510 Atherosclerotic heart disease of native coronary artery without angina pectoris: Secondary | ICD-10-CM

## 2022-08-31 DIAGNOSIS — I10 Essential (primary) hypertension: Secondary | ICD-10-CM

## 2022-08-31 DIAGNOSIS — E785 Hyperlipidemia, unspecified: Secondary | ICD-10-CM

## 2022-08-31 MED ORDER — NITROGLYCERIN 0.4 MG SL SUBL: 0.4000 mg | SUBLINGUAL_TABLET | SUBLINGUAL | 3 refills | Status: DC | PRN

## 2022-08-31 NOTE — Patient Instructions (Signed)
Medication Instructions:  Your Physician recommend you continue on your current medication as directed.    *If you need a refill on your cardiac medications before your next appointment, please call your pharmacy*   Lab Work: None ordered today   Testing/Procedures: None ordered today   Follow-Up: At [facility], you and your health needs are our priority.  As part of our continuing mission to provide you with exceptional heart care, we have created designated Provider Care Teams.  These Care Teams include your primary Cardiologist (physician) and Advanced Practice Providers (APPs -  Physician Assistants and Nurse Practitioners) who all work together to provide you with the care you need, when you need it.  We recommend signing up for the patient portal called "MyChart".  Sign up information is provided on this After Visit Summary.  MyChart is used to connect with patients for Virtual Visits (Telemedicine).  Patients are able to view lab/test results, encounter notes, upcoming appointments, etc.  Non-urgent messages can be sent to your provider as well.   To learn more about what you can do with MyChart, go to [email or web address].    Your next appointment:   3-4 week(s)  Provider:   You may see Yvonne Kendall, MD or one of the following Advanced Practice Providers on your designated Care Team:   Nicolasa Ducking, NP Eula Listen, PA-C Cadence Fransico Michael, PA-C Charlsie Quest, NP

## 2022-09-01 ENCOUNTER — Encounter: Payer: Self-pay | Admitting: Internal Medicine

## 2022-09-01 NOTE — Progress Notes (Signed)
Follow-up Outpatient Visit Date: 08/31/2022  Primary Care Provider: Ailene Ravel, MD [address] [place] [address]  Chief Complaint: Follow-up CAD and atrial fibrillation  HPI:  [name] is a 67 y.o. male with history of CAD with inferior STEMI and primary PCI to the RCA in 03/2019 complicated by sustained ventricular tachycardia in the setting of residual left coronary artery disease status post subsequent PCI to the mid LAD (06/2019), hypertension, hyperlipidemia, paroxysmal atrial fibrillation complicated by stroke in 09/2019, ischemic cardiomyopathy, type 2 diabetes mellitus, and recurrent GI bleeding with subsequent diagnosis of rectal cancer s/p neoadjuvant chemotherapy and XRT complicated by colonic stricture (undergoing surveillance with general surgery and oncology), who presents for follow-up of coronary artery disease and atrial fibrillation.  Today, [name] reports that he has been feeling well.  He has not had any chest pain, shortness of breath, palpitations, lightheadedness, or edema.  He remains compliant with apixaban and has not had any significant bleeding.  He denies focal neurologic deficits to suggest recurrent stroke.  --------------------------------------------------------------------------------------------------  Past Medical History:  Diagnosis Date   Allergic rhinitis    [facility]   Anxiety    Benign essential hypertension    [facility]   Coronary artery disease    [facility]   Diabetic neuropathy, type II diabetes mellitus (HCC)    [facility]   Dysrhythmia    Iron deficiency anemia due to chronic blood loss 11/18/2019   Mixed hyperlipidemia    [facility]   Myocardial infarct [facility])    [facility]   Obesity, unspecified    [facility]   Persistent atrial fibrillation [facility])    Rectal bleeding     [facility]   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) 11/17/2019   Stroke (HCC)    Wide-complex tachycardia 06/04/2019   Past Surgical History:  Procedure Laterality Date   BIOPSY  10/07/2019   Procedure: BIOPSY;  Surgeon: Shellia Cleverly, DO;  Location: [facility];  Service: Gastroenterology;;   COLONOSCOPY WITH PROPOFOL N/A 10/07/2019   Procedure: COLONOSCOPY WITH PROPOFOL;  Surgeon: Shellia Cleverly, DO;  Location: [facility];  Service: Gastroenterology;  Laterality: N/A;   CORONARY ANGIOPLASTY     3 stents   IR IMAGING GUIDED PORT INSERTION  11/24/2019   LEFT HEART CATH AND CORONARY ANGIOGRAPHY N/A 06/05/2019   Procedure: LEFT HEART CATH AND CORONARY ANGIOGRAPHY;  Surgeon: Yvonne Kendall, MD;  Location: [facility];  Service: Cardiovascular;  Laterality: N/A;   POLYPECTOMY  10/07/2019   Procedure: POLYPECTOMY;  Surgeon: Shellia Cleverly, DO;  Location: [facility];  Service: Gastroenterology;;   SUBMUCOSAL TATTOO INJECTION  10/07/2019   Procedure: SUBMUCOSAL TATTOO INJECTION;  Surgeon: Shellia Cleverly, DO;  Location: [facility];  Service: Gastroenterology;;    Current Meds  Medication Sig   diltiazem (CARDIZEM CD) 180 MG 24 hr capsule TAKE 1 CAPSULE BY MOUTH EVERY DAY   ELIQUIS 5 MG TABS tablet TAKE 1 TABLET BY MOUTH TWICE A DAY   ezetimibe (ZETIA) 10 MG tablet Take 1 tablet by mouth once daily   fluticasone (FLONASE) 50 MCG/ACT nasal spray Place 1 spray into both nostrils 2 (two) times daily as needed (sinus congestion).   furosemide (LASIX) 40 MG tablet TAKE 1 TABLET BY MOUTH EVERY DAY   glimepiride (AMARYL) 1 MG tablet Take 1 mg by mouth daily.  glucose blood test strip Monitor BS before meals and at bedtime for a couple of weeks. Then may decrease to bid before meals   KLOR-CON M20 20 MEQ tablet TAKE 2 TABLETS BY MOUTH EVERY DAY   Lancets (ONETOUCH ULTRASOFT) lancets Use as instructed   metoprolol succinate (TOPROL-XL)  50 MG 24 hr tablet TAKE 1 TABLET BY MOUTH DAILY. TAKE WITH OR IMMEDIATELY FOLLOWING A MEAL.   pramoxine-hydrocortisone (PROCTOCREAM-HC) 1-1 % rectal cream Place 1 application rectally 2 (two) times daily as needed for hemorrhoids or anal itching.   [DISCONTINUED] nitroGLYCERIN (NITROSTAT) 0.4 MG SL tablet Place 1 tablet (0.4 mg total) under the tongue every 5 (five) minutes as needed for chest pain.    Allergies: Celexa [citalopram], Ibuprofen, Metformin and related, Naproxen, Yellow jacket venom [bee venom], and Penicillins  Social History   Tobacco Use   Smoking status: Never   Smokeless tobacco: Former    Types: Chew    Quit date: 05/04/2019  Vaping Use   Vaping Use: Never used  Substance Use Topics   Alcohol use: Not Currently    Comment: occasional   Drug use: Yes    Types: Marijuana    Comment: occassionally    Family History  Problem Relation Age of Onset   Diverticulitis Mother    Hypertension Father    Heart disease Father        MI   Prostate cancer Father    Colon cancer Neg Hx    Stomach cancer Neg Hx    Pancreatic cancer Neg Hx    Rectal cancer Neg Hx     Review of Systems: A 12-system review of systems was performed and was negative except as noted in the HPI.  --------------------------------------------------------------------------------------------------  Physical Exam: BP 130/80 (BP Location: Left Arm, Patient Position: Sitting, Cuff Size: Normal)   Pulse 90   Ht 5\' 10"  (1.778 m)   Wt 225 lb (102.1 kg)   SpO2 98%   BMI 32.28 kg/m   General:  NAD. Neck: No JVD or HJR. Lungs: Clear to auscultation bilaterally without wheezes or crackles. Heart: Irregularly irregular rhythm without murmurs. Abdomen: Soft, nontender, nondistended. Extremities: No lower extremity edema.  EKG: Atrial fibrillation with inferior infarct and nonspecific ST/T changes.  Compared to prior tracing from 02/28/2022, atrial fibrillation has replaced sinus rhythm.  Lab  Results  Component Value Date   WBC 9.5 08/09/2022   HGB 13.6 08/09/2022   HCT 39.7 08/09/2022   MCV 87.3 08/09/2022   PLT 224 08/09/2022    Lab Results  Component Value Date   NA 141 08/09/2022   K 3.6 08/09/2022   CL 104 08/09/2022   CO2 28 08/09/2022   BUN 22 08/09/2022   CREATININE 1.22 08/09/2022   GLUCOSE 106 (H) 08/09/2022   ALT 12 08/09/2022    Lab Results  Component Value Date   CHOL 148 10/09/2019   HDL 26 (L) 10/09/2019   LDLCALC 98 10/09/2019   TRIG 119 10/09/2019   CHOLHDL 5.7 10/09/2019    --------------------------------------------------------------------------------------------------  ASSESSMENT AND PLAN: Paroxysmal atrial fibrillation: [name] has a history of persistent atrial fibrillation but has been maintaining sinus rhythm for over a year.  He is back in atrial fibrillation today albeit with adequate rate control.  He is asymptomatic.  Will have him return in about 3 to 4 weeks to reassess his rhythm.  If he remains in atrial fibrillation, we will consider moving forward with cardioversion at that time.  He should continue current regimen  of metoprolol, diltiazem, and apixaban.  Coronary artery disease and hyperlipidemia associated with type 2 diabetes mellitus: No angina reported.  Continue ezetimibe 10 mg daily with history of statin intolerance.  Hypertension: Blood pressure moderately elevated on initial check, improved but still slightly above goal on recheck.  Defer medication changes today, though escalation of antihypertensive therapy will need to be considered at follow-up if blood pressure remains above 130/80.  Follow-up: Return to clinic in 3 to 4 weeks.  Yvonne Kendall, MD 09/01/2022 4:16 PM

## 2022-09-07 ENCOUNTER — Other Ambulatory Visit: Payer: Self-pay | Admitting: Internal Medicine

## 2022-09-07 DIAGNOSIS — I4891 Unspecified atrial fibrillation: Secondary | ICD-10-CM

## 2022-09-17 ENCOUNTER — Other Ambulatory Visit: Payer: Self-pay | Admitting: Internal Medicine

## 2022-09-26 NOTE — Progress Notes (Unsigned)
Cardiology Office Note    Date:  09/27/2022   ID:  Beauregard Zender, DOB [birth year], MRN [id number]  PCP:  Ailene Ravel, MD  Cardiologist:  Yvonne Kendall, MD  Electrophysiologist:  None   Chief Complaint: Follow-up  History of Present Illness:   John Douglas is a 67 y.o. male with history of CAD with inferior ST elevation MI in 03/2019 status post PCI to the RCA complicated by sustained VT in the setting of residual coronary disease status post subsequent PCI to the mid LAD in 06/2019, PAF, CVA in 09/2019, ICM, DM2, recurrent GI bleeding with subsequent diagnosis of rectal cancer status post neoadjuvant chemotherapy and radiation therapy complicated by colonic stricture, HTN, and HLD who presents for follow-up of A-fib.  He was previously followed by [facility].  In 03/2019, he presented to [facility] with an inferior ST elevation MI.  Emergent LHC showed occluded RCA with residual LAD and LCx disease.  He underwent PCI x3 to the RCA with medical management of residual left coronary tree disease.  Echo at that time showed an EF of 45%, akinesis of the inferior wall, mildly dilated left atrium, mildly reduced RV systolic function.  He was seen by GI in 06/2019 with hematochezia and was noted to be hypotensive and tachycardic.  He was transferred to [facility] via EMS for an irregular heart rhythm.  In route to the ED he was noted to be in a wide-complex tachycardic rhythm, possibly VT, with heart rates in the 180s.  He was started on amiodarone infusion.  He subsequently underwent LHC on 06/05/2019 which showed significant single vessel CAD with diffuse mid LAD stenosis up to 80 to 90% extending into the ostium of D1.  There was mild to moderate nonobstructive CAD otherwise in the remaining LAD, LCx, and RCA territories.  There were widely patent overlapping stents within the RCA extending from the ostium through the distal vessel.  He underwent successful PCI/DES to the mid LAD.  The D2 branch was jailed and  exhibited 90% ostial stenosis though had TIMI-3 flow.  At this time, apixaban was held given the further risk of GI bleed.  Echo during that admission showed an EF of 45 to 50%, inferior basal hypokinesis, normal RV systolic function and RV cavity size, trivial mitral regurgitation, and mild to moderate aortic valve sclerosis without stenosis.  He was subsequently diagnosed with rectal cancer in the setting of recurrent hematochezia 09/2019.  He was admitted to the hospital in 09/2019 with right PCA territory infarct.  Echo during that admission showed a normal LVEF at 60 to 65%, no regional wall motion abnormalities, grade 1 diastolic dysfunction, normal RV systolic function and RV cavity size, mildly dilated left atrium, mild to moderate aortic valve sclerosis without stenosis, mildly dilated aorta measuring 38 mm, and negative saline study.  Most recent echo from 04/2020 showed an EF of 50 to 55%, no regional wall motion abnormalities, normal RV systolic function and ventricular cavity size, and no significant valvular abnormalities.  He was last seen in the office on 08/31/2022 and was without symptoms of angina or cardiac decompensation.  He was noted to be back in A-fib (previously had been maintaining sinus rhythm for over a year) with controlled ventricular response.  He was asymptomatic.  It was recommended he follow-up today to reassess the rhythm.  He comes in doing well from a cardiac perspective and is without symptoms of angina or cardiac decompensation.  No palpitations, dizziness, presyncope, or syncope.  No  falls, hematochezia, or melena.  No lower extremity edema or progressive orthopnea.  He reports his girlfriend's FitBit has indicated he has been in sinus rhythm.  He has not fully missed a dose of apixaban, though late taking his morning dose yesterday, which she took around 2 PM and took his evening dose around 2 AM.  No focal neurologic deficits.  He is somewhat hesitant to move forward with  DCCV.   Labs independently reviewed: 07/2022 - potassium 3.6, BUN 22, serum creatinine 1.22, albumin 4.3, AST/ALT not elevated Hgb 13.6, PLT 224 05/2020 - TC 99, TG 111, HDL 31, LDL 37 11/2019 - magnesium 2.0 09/2019 - A1c 8.0 06/2019 - TSH normal  Past Medical History:  Diagnosis Date   Allergic rhinitis    [facility]   Anxiety    Benign essential hypertension    [facility]   Coronary artery disease    [facility]   Diabetic neuropathy, type II diabetes mellitus (HCC)    [facility]   Dysrhythmia    Iron deficiency anemia due to chronic blood loss 11/18/2019   Mixed hyperlipidemia    [facility]   Myocardial infarct [facility])    [facility]   Obesity, unspecified     [facility]   Persistent atrial fibrillation [facility])    Rectal bleeding     [facility]   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) 11/17/2019   Stroke (HCC)    Wide-complex tachycardia 06/04/2019    Past Surgical History:  Procedure Laterality Date   BIOPSY  10/07/2019   Procedure: BIOPSY;  Surgeon: Shellia Cleverly, DO;  Location: [facility];  Service: Gastroenterology;;   COLONOSCOPY WITH PROPOFOL N/A 10/07/2019   Procedure: COLONOSCOPY WITH PROPOFOL;  Surgeon: Shellia Cleverly, DO;  Location: [facility];  Service: Gastroenterology;  Laterality: N/A;   CORONARY ANGIOPLASTY     3 stents   IR IMAGING GUIDED PORT INSERTION  11/24/2019   LEFT HEART CATH AND CORONARY ANGIOGRAPHY N/A 06/05/2019   Procedure: LEFT HEART CATH AND CORONARY ANGIOGRAPHY;  Surgeon: Yvonne Kendall, MD;  Location: [facility]VAS[facility] CV LAB;  Service: Cardiovascular;  Laterality: N/A;   POLYPECTOMY  10/07/2019   Procedure: POLYPECTOMY;  Surgeon: Shellia Cleverly, DO;  Location: [facility];  Service: Gastroenterology;;   SUBMUCOSAL TATTOO INJECTION  10/07/2019   Procedure:  SUBMUCOSAL TATTOO INJECTION;  Surgeon: Shellia Cleverly, DO;  Location: [facility];  Service: Gastroenterology;;    Current Medications: Current Meds  Medication Sig   diltiazem (CARDIZEM CD) 180 MG 24 hr capsule TAKE 1 CAPSULE BY MOUTH EVERY DAY   ELIQUIS 5 MG TABS tablet TAKE 1 TABLET BY MOUTH TWICE A DAY   ezetimibe (ZETIA) 10 MG tablet Take 1 tablet by mouth once daily   fluticasone (FLONASE) 50 MCG/ACT nasal spray Place 1 spray into both nostrils 2 (two) times daily as needed (sinus congestion).   furosemide (LASIX) 40 MG tablet TAKE 1 TABLET BY MOUTH EVERY DAY   glimepiride (AMARYL) 1 MG tablet Take 1 mg by mouth daily.   glucose blood test strip Monitor BS before meals and at bedtime for a couple of weeks. Then may decrease to bid before meals   KLOR-CON M20 20 MEQ tablet TAKE 2 TABLETS BY MOUTH EVERY DAY   Lancets (ONETOUCH ULTRASOFT) lancets Use as instructed   metoprolol succinate (TOPROL-XL) 50 MG 24 hr tablet TAKE 1 TABLET BY  MOUTH DAILY. TAKE WITH OR IMMEDIATELY FOLLOWING A MEAL.   nitroGLYCERIN (NITROSTAT) 0.4 MG SL tablet Place 1 tablet (0.4 mg total) under the tongue every 5 (five) minutes as needed for chest pain.   pramoxine-hydrocortisone (PROCTOCREAM-HC) 1-1 % rectal cream Place 1 application rectally 2 (two) times daily as needed for hemorrhoids or anal itching.    Allergies:   Celexa [citalopram], Ibuprofen, Metformin and related, Naproxen, Yellow jacket venom [bee venom], and Penicillins   Social History   Socioeconomic History   Marital status: Divorced    Spouse name: Not on file   Number of children: 2   Years of education: Not on file   Highest education level: Not on file  Occupational History   Occupation: retired  Tobacco Use   Smoking status: Never   Smokeless tobacco: Former    Types: Chew    Quit date: 05/04/2019  Vaping Use   Vaping Use: Never used  Substance and Sexual Activity   Alcohol use: Not Currently    Comment: occasional   Drug  use: Yes    Types: Marijuana    Comment: occassionally   Sexual activity: Not Currently  Other Topics Concern   Not on file  Social History Narrative   Divorced with 2 adult children   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 There is no history of Colon cancer, Stomach cancer, Pancreatic cancer, or Rectal cancer.  ROS:   12-point review of systems is negative unless otherwise noted in the HPI.   EKGs/Labs/Other Studies Reviewed:    Studies reviewed were summarized above. The additional studies were reviewed today:  Limited echo 05/04/2020: 1. Left ventricular ejection fraction, by estimation, is 50 to 55%. The  left ventricle has low normal function. The left ventricle has no regional  wall motion abnormalities.   2. Right ventricular systolic function is normal. The right ventricular  size is normal.   3. The mitral valve is grossly normal. No evidence of mitral valve  regurgitation.  __________  2D echo 10/09/2019: 1. Left ventricular ejection fraction, by estimation, is 60 to 65%. The  left ventricle has normal function. The left ventricle has no regional  wall motion abnormalities. Left ventricular diastolic parameters are  consistent with Grade I diastolic  dysfunction (impaired relaxation).   2. Right ventricular systolic function is normal. The right ventricular  size is normal.   3. Left atrial size was mildly dilated.   4. The mitral valve is normal in structure. No evidence of mitral valve   regurgitation. No evidence of mitral stenosis.   5. The aortic valve is tricuspid. Aortic valve regurgitation is not  visualized. Mild to moderate aortic valve sclerosis/calcification is  present, without any evidence of aortic stenosis.   6. Aortic dilatation noted. There is mild dilatation at the level of the  sinuses of Valsalva measuring 38 mm.   7. The inferior vena cava is normal in size with greater than 50%  respiratory variability, suggesting  right atrial pressure of 3 mmHg.   8. Agitated saline contrast bubble study was negative, with no evidence  of any interatrial shunt.  __________  [facility] 06/05/2019: Conclusions: Significant single-vessel coronary artery disease with diffuse mid LAD disease of up to 80-90%, extending into the ostium of D1. Mild to moderate, non-obstructive coronary artery disease involving the proximal LAD, LCx and RCA. Widely patent overlapping stents in the RCA extending from the ostium through the distal vessel. Successful PCI to the mid LAD using Synergy 2.5 x 38 mm drug-eluting stent (post-dilated to 2.9 mm) with 0% residual stenosis and TIMI-3 flow.  Jailed D2 branch exhibits 90% ostial stenosis but TIMI-3 flow.  This branch was not intervened upon.   Recommendations: Dual antiplatelet therapy with aspirin and ticagrelor for at least 3 months, at which point we could consider stopping aspirin given report of hematochezia. I favor holding apixaban going further, as I worry that the risk of GI bleeding outweight the benefit at this time. Aggressive secondary prevention. __________  2D echo 06/04/2019: 1. Left ventricular ejection fraction, by visual estimation, is 45 to  50%. The left ventricle has mildly decreased function. There is no left  ventricular hypertrophy.   2. The left ventricle demonstrates regional wall motion abnormalities.   3. Inferior basal hypokinesis.   4. Global right ventricle has normal systolic function.The right  ventricular size  is normal. No increase in right ventricular wall  thickness.   5. Left atrial size was normal.   6. Right atrial size was normal.   7. Mild mitral annular calcification.   8. The mitral valve is normal in structure. Trivial mitral valve  regurgitation. No evidence of mitral stenosis.   9. The tricuspid valve is normal in structure.  10. The tricuspid valve is normal in structure. Tricuspid valve  regurgitation is not demonstrated.  11. The aortic valve is normal in structure. Aortic valve regurgitation is  not visualized. Mild to moderate aortic valve sclerosis/calcification  without any evidence of aortic stenosis.  12. The pulmonic valve was normal in structure. Pulmonic valve  regurgitation is not visualized.  13. The inferior vena cava is normal in size with greater than 50%  respiratory variability, suggesting right atrial pressure of 3 mmHg.    EKG:  EKG is ordered today.  The EKG ordered today demonstrates atrial flutter with variable AV block, 70 bpm, rare PVC nonspecific ST-T changes  Recent Labs: 08/09/2022: ALT 12; BUN 22; Creatinine 1.22; Hemoglobin 13.6; Platelet Count 224; Potassium 3.6; Sodium 141  Recent Lipid Panel    Component Value Date/Time   CHOL 148 10/09/2019 0500   CHOL 78 (L) 08/28/2019 0940   TRIG 119 10/09/2019 0500   HDL 26 (L) 10/09/2019 0500   HDL 28 (L) 08/28/2019 0940   CHOLHDL 5.7 10/09/2019 0500   VLDL 24 10/09/2019 0500   LDLCALC 98 10/09/2019 0500   LDLCALC 32 08/28/2019 0940    PHYSICAL EXAM:    VS:  BP 130/80 (BP Location: Left Arm, Patient Position: Sitting, Cuff Size: Normal)   Pulse 70   Ht 5\' 10"  (1.778 m)   Wt 223 lb 4 oz (101.3 kg)   SpO2 98%   BMI 32.03 kg/m   BMI: Body mass index is 32.03 kg/m.  Physical Exam Vitals reviewed.  Constitutional:      Appearance: He is well-developed.  HENT:     Head: Normocephalic and atraumatic.  Eyes:     General:        Right  eye: No discharge.        Left eye: No discharge.  Neck:      Vascular: No JVD.  Cardiovascular:     Rate and Rhythm: Normal rate. Rhythm irregularly irregular.     Heart sounds: Normal heart sounds, S1 normal and S2 normal. Heart sounds not distant. No midsystolic click and no opening snap. No murmur heard.    No friction rub.  Pulmonary:     Effort: Pulmonary effort is normal. No respiratory distress.     Breath sounds: Normal breath sounds. No decreased breath sounds, wheezing or rales.  Chest:     Chest wall: No tenderness.  Abdominal:     General: There is no distension.  Musculoskeletal:     Cervical back: Normal range of motion.  Skin:    General: Skin is warm and dry.     Nails: There is no clubbing.  Neurological:     Mental Status: He is alert and oriented to person, place, and time.  Psychiatric:        Speech: Speech normal.        Behavior: Behavior normal.        Thought Content: Thought content normal.        Judgment: Judgment normal.     Wt Readings from Last 3 Encounters:  09/27/22 223 lb 4 oz (101.3 kg)  08/31/22 225 lb (102.1 kg)  08/09/22 224 lb (101.6 kg)     ASSESSMENT & PLAN:   PAF/flutter: Currently in atrial flutter with variable AV block.  Asymptomatic.  He reports his girlfriend's Fitbit has indicated episodes of sinus rhythm.  Further details of this are unclear.  He is hesitant to move forward with DCCV at this time.  If he is in fact having paroxysms of A-fib with spontaneous conversion to sinus rhythm, move forward with DCCV at this time until further information is obtained.  He was also late in taking a dose of apixaban yesterday, though did not completely miss a dose.  To further evaluate if he has been maintaining A-fib/flutter since his last visit, versus having paroxysms of A-fib/flutter with spontaneous conversion to sinus rhythm, we will place a 7-day ZIO XT.  We will also try and correlate this with with his girlfriend's Fitbit readings.  Continue diltiazem and metoprolol.  CHA2DS2-VASc at least 7  (CHF, HTN, age x 1, DM, CVA x 2, vascular disease).  He remains on apixaban 5 mg twice daily and does not meet reduced dosing criteria.  CAD involving the native coronary arteries without angina with HLD: He is doing well and without symptoms of angina or cardiac decompensation.  LDL 37.  He remains on ezetimibe 10 mg daily in the setting of statin intolerance.  HTN: Blood pressure is reasonably controlled in the office today.  He remains on diltiazem and metoprolol.    Disposition: F/u with Dr. Okey Dupre or an APP in 1 month.   Medication Adjustments/Labs and Tests Ordered: Current medicines are reviewed at length with the patient today.  Concerns regarding medicines are outlined above. Medication changes, Labs and Tests ordered today are summarized above and listed in the Patient Instructions accessible in Encounters.   Signed, Eula Listen, PA-C 09/27/2022 4:40 PM     [facility] [place] [address] [place], [place] [address] [phone]

## 2022-09-27 ENCOUNTER — Encounter: Payer: Self-pay | Admitting: Physician Assistant

## 2022-09-27 ENCOUNTER — Ambulatory Visit (INDEPENDENT_AMBULATORY_CARE_PROVIDER_SITE_OTHER): Payer: Medicare HMO

## 2022-09-27 ENCOUNTER — Ambulatory Visit: Payer: Medicare HMO | Attending: Physician Assistant | Admitting: Physician Assistant

## 2022-09-27 VITALS — BP 130/80 | HR 70 | Ht 70.0 in | Wt 223.2 lb

## 2022-09-27 DIAGNOSIS — I480 Paroxysmal atrial fibrillation: Secondary | ICD-10-CM

## 2022-09-27 DIAGNOSIS — I10 Essential (primary) hypertension: Secondary | ICD-10-CM | POA: Diagnosis not present

## 2022-09-27 DIAGNOSIS — E785 Hyperlipidemia, unspecified: Secondary | ICD-10-CM

## 2022-09-27 DIAGNOSIS — I2510 Atherosclerotic heart disease of native coronary artery without angina pectoris: Secondary | ICD-10-CM

## 2022-09-27 NOTE — Patient Instructions (Signed)
Medication Instructions:  Your Physician recommend you continue on your current medication as directed.    *If you need a refill on your cardiac medications before your next appointment, please call your pharmacy*   Lab Work: None ordered today   Testing/Procedures: Your physician has recommended that you wear a 7 day Zio monitor.   This monitor is a medical device that records the heart's electrical activity. Doctors most often use these monitors to diagnose arrhythmias. Arrhythmias are problems with the speed or rhythm of the heartbeat. The monitor is a small device applied to your chest. You can wear one while you do your normal daily activities. While wearing this monitor if you have any symptoms to push the button and record what you felt. Once you have worn this monitor for the period of time provider prescribed (Usually 14 days), you will return the monitor device in the postage paid box. Once it is returned they will download the data collected and provide [place] with a report which the provider will then review and we will call you with those results. Important tips:  Avoid showering during the first 24 hours of wearing the monitor. Avoid excessive sweating to help maximize wear time. Do not submerge the device, no hot tubs, and no swimming pools. Keep any lotions or oils away from the patch. After 24 hours you may shower with the patch on. Take brief showers with your back facing the shower head.  Do not remove patch once it has been placed because that will interrupt data and decrease adhesive wear time. Push the button when you have any symptoms and write down what you were feeling. Once you have completed wearing your monitor, remove and place into box which has postage paid and place in your outgoing mailbox.  If for some reason you have misplaced your box then call our office and we can provide another box and/or mail it off for you.      Follow-Up: At [facility],  you and your health needs are our priority.  As part of our continuing mission to provide you with exceptional heart care, we have created designated Provider Care Teams.  These Care Teams include your primary Cardiologist (physician) and Advanced Practice Providers (APPs -  Physician Assistants and Nurse Practitioners) who all work together to provide you with the care you need, when you need it.  We recommend signing up for the patient portal called "MyChart".  Sign up information is provided on this After Visit Summary.  MyChart is used to connect with patients for Virtual Visits (Telemedicine).  Patients are able to view lab/test results, encounter notes, upcoming appointments, etc.  Non-urgent messages can be sent to your provider as well.   To learn more about what you can do with MyChart, go to [email or web address].    Your next appointment:   1 month(s)  Provider:   You may see Yvonne Kendall, MD or one of the following Advanced Practice Providers on your designated Care Team:   Nicolasa Ducking, NP Eula Listen, PA-C Cadence Fransico Michael, PA-C Charlsie Quest, NP

## 2022-09-30 DIAGNOSIS — I480 Paroxysmal atrial fibrillation: Secondary | ICD-10-CM | POA: Diagnosis not present

## 2022-10-01 ENCOUNTER — Inpatient Hospital Stay: Payer: Medicare HMO | Attending: Hematology & Oncology

## 2022-10-01 DIAGNOSIS — Z85048 Personal history of other malignant neoplasm of rectum, rectosigmoid junction, and anus: Secondary | ICD-10-CM | POA: Insufficient documentation

## 2022-10-01 DIAGNOSIS — Z452 Encounter for adjustment and management of vascular access device: Secondary | ICD-10-CM | POA: Diagnosis not present

## 2022-10-01 DIAGNOSIS — Z95828 Presence of other vascular implants and grafts: Secondary | ICD-10-CM

## 2022-10-01 MED ORDER — SODIUM CHLORIDE 0.9% FLUSH
10.0000 mL | Freq: Once | INTRAVENOUS | Status: AC
  Administered 2022-10-01: 10 mL via INTRAVENOUS

## 2022-10-01 MED ORDER — HEPARIN SOD (PORK) LOCK FLUSH 100 UNIT/ML IV SOLN
500.0000 [IU] | Freq: Once | INTRAVENOUS | Status: AC
  Administered 2022-10-01: 500 [IU] via INTRAVENOUS

## 2022-10-01 NOTE — Patient Instructions (Signed)

## 2022-10-11 ENCOUNTER — Other Ambulatory Visit: Payer: Self-pay | Admitting: Hematology & Oncology

## 2022-10-24 NOTE — Progress Notes (Signed)
Cardiology Office Note    Date:  10/26/2022   ID:  John Douglas, DOB 1955/07/24, MRN [id number]  PCP:  Ailene Ravel, MD  Cardiologist:  Yvonne Kendall, MD  Electrophysiologist:  None   Chief Complaint: Follow up  History of Present Illness:   John Douglas is a 67 y.o. male with history of CAD with inferior ST elevation MI in 03/2019 status post PCI to the RCA complicated by sustained VT in the setting of residual coronary disease status post subsequent PCI to the mid LAD in 06/2019, persistent A-fib, CVA in 09/2019, ICM, DM2, recurrent GI bleeding with subsequent diagnosis of rectal cancer status post neoadjuvant chemotherapy and radiation therapy complicated by colonic stricture, HTN, and HLD who presents for follow-up of A-fib/Zio patch.   He was previously followed by [facility].  In 03/2019, he presented to [facility] with an inferior ST elevation MI.  Emergent LHC showed occluded RCA with residual LAD and LCx disease.  He underwent PCI x3 to the RCA with medical management of residual left coronary tree disease.  Echo at that time showed an EF of 45%, akinesis of the inferior wall, mildly dilated left atrium, mildly reduced RV systolic function.  He was seen by GI in 06/2019 with hematochezia and was noted to be hypotensive and tachycardic.  He was transferred to [facility] via EMS for an irregular heart rhythm.  In route to the ED he was noted to be in a wide-complex tachycardic rhythm, possibly VT, with heart rates in the 180s.  He was started on amiodarone infusion.  He subsequently underwent LHC on 06/05/2019 which showed significant single vessel CAD with diffuse mid LAD stenosis up to 80 to 90% extending into the ostium of D1.  There was mild to moderate nonobstructive CAD otherwise in the remaining LAD, LCx, and RCA territories.  There were widely patent overlapping stents within the RCA extending from the ostium through the distal vessel.  He underwent successful PCI/DES to the mid LAD.  The D2  branch was jailed and exhibited 90% ostial stenosis though had TIMI-3 flow.  At this time, apixaban was held given the further risk of GI bleed.  Echo during that admission showed an EF of 45 to 50%, inferior basal hypokinesis, normal RV systolic function and RV cavity size, trivial mitral regurgitation, and mild to moderate aortic valve sclerosis without stenosis.  He was subsequently diagnosed with rectal cancer in the setting of recurrent hematochezia 09/2019.  He was admitted to the hospital in 09/2019 with right PCA territory infarct.  Echo during that admission showed a normal LVEF at 60 to 65%, no regional wall motion abnormalities, grade 1 diastolic dysfunction, normal RV systolic function and RV cavity size, mildly dilated left atrium, mild to moderate aortic valve sclerosis without stenosis, mildly dilated aorta measuring 38 mm, and negative saline study.  Most recent echo from 04/2020 showed an EF of 50 to 55%, no regional wall motion abnormalities, normal RV systolic function and ventricular cavity size, and no significant valvular abnormalities.  He was seen in the office on 08/31/2022 and was without symptoms of angina or cardiac decompensation.  He was noted to be back in A-fib (previously had been maintaining sinus rhythm for over a year) with controlled ventricular response.  He was asymptomatic.  He was continued on metoprolol, diltiazem, and apixaban.  He followed up on 08/31/2022 and was without symptoms of angina or cardiac decompensation.  No palpitations, dizziness, presyncope, or syncope.  He reported his girlfriend's  Fitbit indicated he had been in sinus rhythm.  He had not missed apixaban, though was late in taking one dose.  He was hesitant to move forward with DCCV.  To further evaluate if he was having paroxysms of A-fib with spontaneous conversion to sinus rhythm, as reported by his girlfriend's Fitbit, he underwent Zio patch in 08/2022 demonstrated a 100% coarse A-fib versus atrial flutter  burden with an average ventricular rate of 95 bpm (range 60 to 152 bpm), occasional PVCs versus aberrantly conducted beats with a 2.5% burden, no prolonged pauses, and patient triggered events corresponded to A-fib/flutter.  He comes in doing well from a cardiac perspective and remains without symptoms of angina or cardiac decompensation.  No palpitations, dizziness, presyncope, or syncope.  He did have issues with keeping the Zio patch applied secondary to working out in the heat and sweat.  In the setting, data was limited.  However, it did show a 100% A-fib burden as outlined above.  He remains hesitant to move forward with rhythm control, including pharmacologic or cardioversion.  Reports his girlfriend's Fitbit has demonstrated variable heart rates.  No falls.  He did have 1 episode of BRBPR in the setting of increased wiping, none since.  No melena.  Weight well.  No lower extremity swelling or progressive orthopnea.  Overall, he feels well and does not have any acute cardiac concerns at this time.   Labs independently reviewed: 07/2022 - potassium 3.6, BUN 22, serum creatinine 1.22, albumin 4.3, AST/ALT not elevated Hgb 13.6, PLT 224 05/2020 - TC 99, TG 111, HDL 31, LDL 37 11/2019 - magnesium 2.0 09/2019 - A1c 8.0 06/2019 - TSH normal  Past Medical History:  Diagnosis Date   Allergic rhinitis    [facility]   Anxiety    Benign essential hypertension    [facility]   Coronary artery disease    [facility]   Diabetic neuropathy, type II diabetes mellitus (HCC)    [facility]   Dysrhythmia    Iron deficiency anemia due to chronic blood loss 11/18/2019   Mixed hyperlipidemia    [facility]   Myocardial infarct [facility])    [facility]   Obesity, unspecified    [facility]   Persistent atrial fibrillation [facility])    Rectal bleeding    [facility]   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) 11/17/2019   Stroke (HCC)    Wide-complex tachycardia 06/04/2019    Past Surgical History:  Procedure Laterality Date   BIOPSY  10/07/2019   Procedure: BIOPSY;  Surgeon: Shellia Cleverly, DO;  Location: [facility];  Service: Gastroenterology;;   COLONOSCOPY WITH PROPOFOL N/A 10/07/2019   Procedure: COLONOSCOPY WITH PROPOFOL;  Surgeon: Shellia Cleverly, DO;  Location: [facility];  Service: Gastroenterology;  Laterality: N/A;   CORONARY ANGIOPLASTY     3 stents   IR IMAGING GUIDED PORT INSERTION  11/24/2019   LEFT HEART CATH AND CORONARY ANGIOGRAPHY N/A 06/05/2019   Procedure: LEFT HEART CATH AND CORONARY ANGIOGRAPHY;  Surgeon: Yvonne Kendall, MD;  Location: [facility] INVAS[facility] CV LAB;  Service: Cardiovascular;  Laterality: N/A;   POLYPECTOMY  10/07/2019   Procedure: POLYPECTOMY;  Surgeon: Shellia Cleverly, DO;  Location: [facility];  Service: Gastroenterology;;   SUBMUCOSAL TATTOO INJECTION  10/07/2019   Procedure: SUBMUCOSAL TATTOO INJECTION;  Surgeon: Shellia Cleverly, DO;  Location: [facility];  Service: Gastroenterology;;  Current Medications: Current Meds  Medication Sig   ELIQUIS 5 MG TABS tablet TAKE 1 TABLET BY MOUTH TWICE A DAY   ezetimibe (ZETIA) 10 MG tablet Take 1 tablet by mouth once daily   fluticasone (FLONASE) 50 MCG/ACT nasal spray Place 1 spray into both nostrils 2 (two) times daily as needed (sinus congestion).   furosemide (LASIX) 40 MG tablet TAKE 1 TABLET BY MOUTH EVERY DAY   glimepiride (AMARYL) 1 MG tablet Take 1 mg by mouth daily.   glucose blood test strip Monitor BS before meals and at bedtime for a couple of weeks. Then may decrease to bid before meals   KLOR-CON M20 20 MEQ tablet TAKE 2 TABLETS BY MOUTH EVERY DAY   Lancets (ONETOUCH ULTRASOFT) lancets Use as instructed   metoprolol succinate (TOPROL-XL) 50 MG 24 hr tablet TAKE 1 TABLET BY MOUTH DAILY. TAKE WITH OR IMMEDIATELY  FOLLOWING A MEAL.   nitroGLYCERIN (NITROSTAT) 0.4 MG SL tablet Place 1 tablet (0.4 mg total) under the tongue every 5 (five) minutes as needed for chest pain.   pramoxine-hydrocortisone (PROCTOCREAM-HC) 1-1 % rectal cream Place 1 application rectally 2 (two) times daily as needed for hemorrhoids or anal itching.   [DISCONTINUED] diltiazem (CARDIZEM CD) 180 MG 24 hr capsule TAKE 1 CAPSULE BY MOUTH EVERY DAY    Allergies:   Celexa [citalopram], Ibuprofen, Metformin and related, Naproxen, Yellow jacket venom [bee venom], and Penicillins   Social History   Socioeconomic History   Marital status: Divorced    Spouse name: Not on file   Number of children: 2   Years of education: Not on file   Highest education level: Not on file  Occupational History   Occupation: retired  Tobacco Use   Smoking status: Never   Smokeless tobacco: Former    Types: Chew    Quit date: 05/04/2019  Vaping Use   Vaping Use: Never used  Substance and Sexual Activity   Alcohol use: Not Currently    Comment: occasional   Drug use: Yes    Types: Marijuana    Comment: occassionally   Sexual activity: Not Currently  Other Topics Concern   Not on file  Social History Narrative   Divorced with 2 adult children   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 There is no history of Colon cancer, Stomach cancer, Pancreatic cancer, or Rectal cancer.  ROS:   12-point review of systems is negative unless otherwise noted in the  HPI.   EKGs/Labs/Other Studies Reviewed:    Studies reviewed were summarized above. The additional studies were reviewed today:  Zio patch 08/2022:   The patient was monitored for 7 days, 2 hours.   The predominant rhythm was coarse atrial fibrillation versus atrial flutter (100% burden) with an average ventricular rate of 95 bpm (range 60-152 bpm).   There were occasional PVCs versus aberrantly conducted beats (2.5% burden).   No prolonged pause was observed.   Patient triggered event corresponds to atrial  fibrillation/flutter.   Persistent coarse atrial fibrillation versus atrial flutter with variable conduction, as detailed above.  Occasional PVCs versus aberrantly conducted beats observed. __________  Limited echo 05/04/2020: 1. Left ventricular ejection fraction, by estimation, is 50 to 55%. The  left ventricle has low normal function. The left ventricle has no regional  wall motion abnormalities.   2. Right ventricular systolic function is normal. The right ventricular  size is normal.   3. The mitral valve is grossly normal. No evidence of mitral valve  regurgitation.  __________   2D echo 10/09/2019: 1. Left ventricular ejection fraction, by estimation, is 60 to 65%. The  left ventricle has normal function. The left ventricle has no regional  wall motion abnormalities. Left ventricular diastolic parameters are  consistent with Grade I diastolic  dysfunction (impaired relaxation).   2. Right ventricular systolic function is normal. The right ventricular  size is normal.   3. Left atrial size was mildly dilated.   4. The mitral valve is normal in structure. No evidence of mitral valve  regurgitation. No evidence of mitral stenosis.   5. The aortic valve is tricuspid. Aortic valve regurgitation is not  visualized. Mild to moderate aortic valve sclerosis/calcification is  present, without any evidence of aortic stenosis.   6. Aortic dilatation noted. There is mild dilatation  at the level of the  sinuses of Valsalva measuring 38 mm.   7. The inferior vena cava is normal in size with greater than 50%  respiratory variability, suggesting right atrial pressure of 3 mmHg.   8. Agitated saline contrast bubble study was negative, with no evidence  of any interatrial shunt.  __________   [facility] 06/05/2019: Conclusions: Significant single-vessel coronary artery disease with diffuse mid LAD disease of up to 80-90%, extending into the ostium of D1. Mild to moderate, non-obstructive coronary artery disease involving the proximal LAD, LCx and RCA. Widely patent overlapping stents in the RCA extending from the ostium through the distal vessel. Successful PCI to the mid LAD using Synergy 2.5 x 38 mm drug-eluting stent (post-dilated to 2.9 mm) with 0% residual stenosis and TIMI-3 flow.  Jailed D2 branch exhibits 90% ostial stenosis but TIMI-3 flow.  This branch was not intervened upon.   Recommendations: Dual antiplatelet therapy with aspirin and ticagrelor for at least 3 months, at which point we could consider stopping aspirin given report of hematochezia. I favor holding apixaban going further, as I worry that the risk of GI bleeding outweight the benefit at this time. Aggressive secondary prevention. __________   2D echo 06/04/2019: 1. Left ventricular ejection fraction, by visual estimation, is 45 to  50%. The left ventricle has mildly decreased function. There is no left  ventricular hypertrophy.   2. The left ventricle demonstrates regional wall motion abnormalities.   3. Inferior basal hypokinesis.   4. Global right ventricle has normal systolic function.The right  ventricular size is normal. No increase in right ventricular wall  thickness.   5. Left atrial size was normal.   6. Right atrial size was normal.   7. Mild mitral annular calcification.   8. The mitral valve is normal in structure. Trivial mitral valve  regurgitation. No evidence of mitral stenosis.   9.  The tricuspid valve is normal in structure.  10. The tricuspid valve is normal in structure. Tricuspid valve  regurgitation is not demonstrated.  11. The aortic valve is normal in structure. Aortic valve regurgitation is  not visualized. Mild to moderate aortic valve sclerosis/calcification  without any evidence of aortic stenosis.  12. The pulmonic valve was normal in structure. Pulmonic valve  regurgitation is not visualized.  13. The inferior vena cava is normal in size with greater than 50%  respiratory variability, suggesting right atrial pressure of 3 mmHg.    EKG:  EKG is ordered today.  The EKG ordered today demonstrates A-fib, 72 bpm, prior inferior infarct, nonspecific ST-T changes, consistent with prior tracing  Recent Labs: 08/09/2022: ALT 12; BUN 22; Creatinine 1.22; Hemoglobin 13.6; Platelet Count 224; Potassium 3.6; Sodium 141  Recent Lipid Panel    Component Value Date/Time   CHOL 148 10/09/2019 0500   CHOL 78 (L) 08/28/2019 0940   TRIG 119 10/09/2019 0500   HDL 26 (L) 10/09/2019 0500   HDL 28 (L) 08/28/2019 0940   CHOLHDL 5.7 10/09/2019 0500   VLDL 24 10/09/2019 0500   LDLCALC 98 10/09/2019 0500   LDLCALC 32 08/28/2019 0940    PHYSICAL EXAM:    VS:  BP 116/76 (BP Location: Left Arm, Patient Position: Sitting, Cuff Size: Normal)   Pulse 72   Ht 5\' 10"  (1.778 m)   Wt 226 lb (102.5 kg)   SpO2 97%   BMI 32.43 kg/m   BMI: Body mass index is 32.43 kg/m.  Physical Exam Vitals reviewed.  Constitutional:      Appearance: He is well-developed.  HENT:     Head: Normocephalic and atraumatic.  Eyes:     General:        Right eye: No discharge.        Left eye: No discharge.  Neck:     Vascular: No JVD.  Cardiovascular:     Rate and Rhythm: Normal rate. Rhythm irregularly irregular.     Heart sounds: Normal heart sounds, S1 normal and S2 normal. Heart sounds not distant. No midsystolic click and no opening snap. No murmur heard.    No friction rub.   Pulmonary:     Effort: Pulmonary effort is normal. No respiratory distress.     Breath sounds: Normal breath sounds. No decreased breath sounds, wheezing or rales.  Chest:     Chest wall: No tenderness.  Abdominal:     General: There is no distension.  Musculoskeletal:     Cervical back: Normal range of motion.  Skin:    General: Skin is warm and dry.     Nails: There is no clubbing.  Neurological:     Mental Status: He is alert and oriented to person, place, and time.  Psychiatric:        Speech: Speech normal.        Behavior: Behavior normal.        Thought Content: Thought content normal.        Judgment: Judgment normal.     Wt Readings from Last 3 Encounters:  10/26/22 226 lb (102.5 kg)  09/27/22 223 lb 4 oz (101.3 kg)  08/31/22 225 lb (102.1 kg)     ASSESSMENT & PLAN:   Persistent A-fib/flutter: He remains in A-fib with controlled ventricular response and is asymptomatic.  Zio patch demonstrated a 100% A-fib burden.  He declines moving forward with DCCV at this time.  He would not like to pursue pharmacologic rhythm control either.  He has been advised of the difficulty of pursuing rhythm control the longer he remains in A-fib, as well as potential comorbid conditions.  He is agreeable to titration of diltiazem to 240 mg daily with continuation of Toprol-XL 50 mg daily.  CHA2DS2-VASc at least 7.  He remains on apixaban 5 mg twice daily and does not meet reduced dosing criteria.  Recent labs stable.  CAD involving the native coronary arteries without angina with HLD and statin intolerance: He is without symptoms of angina or cardiac decompensation.  LDL 37 in 2022.  He remains on apixaban in place of aspirin given underlying A-fib in an effort to minimize bleeding risk.  Continue lisinopril 10 mg daily with noted statin intolerance.  HTN: Blood pressure is well-controlled in the office today.  Continue current medical therapy as outlined above.  History of rectal cancer  with GI bleeding: He reports 1 episode of bright red blood per rectum, with increased wiping.  None since.  Have advised him to discuss this further with his PCP.  Hemodynamically stable today.   Disposition: F/u with Dr. Okey Dupre or an APP in 2 months.   Medication Adjustments/Labs and Tests Ordered: Current medicines are reviewed at length with the patient today.  Concerns regarding medicines are outlined above. Medication changes, Labs and Tests ordered today are summarized above and listed in the Patient Instructions accessible in Encounters.   Signed, Eula Listen, PA-C 10/26/2022 4:25 PM     [facility] [address] [place], [place] [address] [phone]

## 2022-10-26 ENCOUNTER — Encounter: Payer: Self-pay | Admitting: Physician Assistant

## 2022-10-26 ENCOUNTER — Ambulatory Visit: Payer: Medicare HMO | Attending: Physician Assistant | Admitting: Physician Assistant

## 2022-10-26 VITALS — BP 116/76 | HR 72 | Ht 70.0 in | Wt 226.0 lb

## 2022-10-26 DIAGNOSIS — Z85048 Personal history of other malignant neoplasm of rectum, rectosigmoid junction, and anus: Secondary | ICD-10-CM

## 2022-10-26 DIAGNOSIS — I10 Essential (primary) hypertension: Secondary | ICD-10-CM

## 2022-10-26 DIAGNOSIS — Z789 Other specified health status: Secondary | ICD-10-CM

## 2022-10-26 DIAGNOSIS — E785 Hyperlipidemia, unspecified: Secondary | ICD-10-CM | POA: Diagnosis not present

## 2022-10-26 DIAGNOSIS — I4819 Other persistent atrial fibrillation: Secondary | ICD-10-CM

## 2022-10-26 DIAGNOSIS — I2510 Atherosclerotic heart disease of native coronary artery without angina pectoris: Secondary | ICD-10-CM

## 2022-10-26 MED ORDER — DILTIAZEM HCL ER COATED BEADS 240 MG PO CP24
ORAL_CAPSULE | ORAL | 0 refills | Status: AC
Start: 2022-10-26 — End: ?

## 2022-10-26 NOTE — Patient Instructions (Signed)
Medication Instructions:   Your physician has recommended you make the following change in your medication:   INCREASE Diltiazem 240 mg tablet by mouth daily.  *If you need a refill on your cardiac medications before your next appointment, please call your pharmacy*   Lab Work:  NO LAB WORK ORDERED TODAY.  If you have labs (blood work) drawn today and your tests are completely normal, you will receive your results only by: MyChart Message (if you have MyChart) OR A paper copy in the mail If you have any lab test that is abnormal or we need to change your treatment, we will call you to review the results.   Testing/Procedures:  NO TESTING ORDERED TODAY.   Follow-Up: At [facility], you and your health needs are our priority.  As part of our continuing mission to provide you with exceptional heart care, we have created designated Provider Care Teams.  These Care Teams include your primary Cardiologist (physician) and Advanced Practice Providers (APPs -  Physician Assistants and Nurse Practitioners) who all work together to provide you with the care you need, when you need it.  We recommend signing up for the patient portal called "MyChart".  Sign up information is provided on this After Visit Summary.  MyChart is used to connect with patients for Virtual Visits (Telemedicine).  Patients are able to view lab/test results, encounter notes, upcoming appointments, etc.  Non-urgent messages can be sent to your provider as well.   To learn more about what you can do with MyChart, go to [email or web address].    Your next appointment:   2 month(s)  Provider:   You may see Yvonne Kendall, MD or one of the following Advanced Practice Providers on your designated Care Team:   Nicolasa Ducking, NP Eula Listen, PA-C Cadence Fransico Michael, PA-C Charlsie Quest, NP

## 2022-11-08 ENCOUNTER — Inpatient Hospital Stay: Payer: Medicare HMO | Attending: Hematology & Oncology

## 2022-11-08 ENCOUNTER — Inpatient Hospital Stay: Payer: Medicare HMO | Admitting: Hematology & Oncology

## 2022-11-08 ENCOUNTER — Inpatient Hospital Stay: Payer: Medicare HMO

## 2022-11-08 ENCOUNTER — Other Ambulatory Visit: Payer: Self-pay

## 2022-11-08 ENCOUNTER — Encounter: Payer: Self-pay | Admitting: Hematology & Oncology

## 2022-11-08 VITALS — BP 133/80 | HR 47 | Temp 98.6°F | Resp 16 | Ht 70.0 in | Wt 220.0 lb

## 2022-11-08 DIAGNOSIS — Z7982 Long term (current) use of aspirin: Secondary | ICD-10-CM | POA: Diagnosis not present

## 2022-11-08 DIAGNOSIS — Z95828 Presence of other vascular implants and grafts: Secondary | ICD-10-CM

## 2022-11-08 DIAGNOSIS — C20 Malignant neoplasm of rectum: Secondary | ICD-10-CM

## 2022-11-08 DIAGNOSIS — I4891 Unspecified atrial fibrillation: Secondary | ICD-10-CM | POA: Insufficient documentation

## 2022-11-08 DIAGNOSIS — Z08 Encounter for follow-up examination after completed treatment for malignant neoplasm: Secondary | ICD-10-CM | POA: Diagnosis present

## 2022-11-08 DIAGNOSIS — D500 Iron deficiency anemia secondary to blood loss (chronic): Secondary | ICD-10-CM

## 2022-11-08 DIAGNOSIS — Z7901 Long term (current) use of anticoagulants: Secondary | ICD-10-CM | POA: Insufficient documentation

## 2022-11-08 DIAGNOSIS — Z452 Encounter for adjustment and management of vascular access device: Secondary | ICD-10-CM | POA: Insufficient documentation

## 2022-11-08 DIAGNOSIS — Z85048 Personal history of other malignant neoplasm of rectum, rectosigmoid junction, and anus: Secondary | ICD-10-CM | POA: Diagnosis present

## 2022-11-08 LAB — CMP (CANCER CENTER ONLY)
ALT: 10 U/L (ref 0–44)
AST: 11 U/L — ABNORMAL LOW (ref 15–41)
Albumin: 4.4 g/dL (ref 3.5–5.0)
Alkaline Phosphatase: 93 U/L (ref 38–126)
Anion gap: 9 (ref 5–15)
BUN: 19 mg/dL (ref 8–23)
CO2: 28 mmol/L (ref 22–32)
Calcium: 9.5 mg/dL (ref 8.9–10.3)
Chloride: 103 mmol/L (ref 98–111)
Creatinine: 1.11 mg/dL (ref 0.61–1.24)
GFR, Estimated: >60 mL/min (ref >60)
Glucose, Bld: 224 mg/dL — ABNORMAL HIGH (ref 70–99)
Potassium: 3.5 mmol/L (ref 3.5–5.1)
Sodium: 140 mmol/L (ref 135–145)
Total Bilirubin: 0.9 mg/dL (ref 0.3–1.2)
Total Protein: 7.1 g/dL (ref 6.5–8.1)

## 2022-11-08 LAB — CBC WITH DIFFERENTIAL (CANCER CENTER ONLY)
Abs Immature Granulocytes: 0.06 10*3/uL (ref 0.00–0.07)
Basophils Absolute: 0 10*3/uL (ref 0.0–0.1)
Basophils Relative: 0 %
Eosinophils Absolute: 0.2 10*3/uL (ref 0.0–0.5)
Eosinophils Relative: 2 %
HCT: 40.5 % (ref 39.0–52.0)
Hemoglobin: 13.9 g/dL (ref 13.0–17.0)
Immature Granulocytes: 1 %
Lymphocytes Relative: 16 %
Lymphs Abs: 1.6 10*3/uL (ref 0.7–4.0)
MCH: 30.2 pg (ref 26.0–34.0)
MCHC: 34.3 g/dL (ref 30.0–36.0)
MCV: 87.9 fL (ref 80.0–100.0)
Monocytes Absolute: 0.5 10*3/uL (ref 0.1–1.0)
Monocytes Relative: 5 %
Neutro Abs: 7.5 10*3/uL (ref 1.7–7.7)
Neutrophils Relative %: 76 %
Platelet Count: 227 10*3/uL (ref 150–400)
RBC: 4.61 MIL/uL (ref 4.22–5.81)
RDW: 16.2 % — ABNORMAL HIGH (ref 11.5–15.5)
WBC Count: 9.9 10*3/uL (ref 4.0–10.5)
nRBC: 0 % (ref 0.0–0.2)

## 2022-11-08 LAB — CEA (IN HOUSE-CHCC): CEA (CHCC-In House): 2.47 ng/mL (ref 0.00–5.00)

## 2022-11-08 LAB — LACTATE DEHYDROGENASE: LDH: 213 U/L — ABNORMAL HIGH (ref 98–192)

## 2022-11-08 MED ORDER — SODIUM CHLORIDE 0.9% FLUSH
10.0000 mL | INTRAVENOUS | Status: DC | PRN
  Administered 2022-11-08: 10 mL via INTRAVENOUS

## 2022-11-08 MED ORDER — HEPARIN SOD (PORK) LOCK FLUSH 100 UNIT/ML IV SOLN
500.0000 [IU] | Freq: Once | INTRAVENOUS | Status: AC
  Administered 2022-11-08: 500 [IU] via INTRAVENOUS

## 2022-11-08 NOTE — Patient Instructions (Signed)

## 2022-11-08 NOTE — Progress Notes (Signed)
Hematology and Oncology Follow Up Visit  John Douglas [id number] 11-Jun-1955 67 y.o. 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 twice daily Aspirin 81 mg p.o. daily Zirabev (biosimilar Avastin)/Xeloda maintenance - started 10/12/2020 -- d/c on 11/2020   Interim History:  John Douglas is here today for follow-up.  We saw him back in April.  Since then, he has been quite busy, as always.  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  He is quite busy with this.  I think his daughter's water runs this.  His last CEA level was 2.2.  He is on Eliquis.  He is doing well with this.  His atrial fibrillation has been a little bit of a problem.  He had his Cardizem dose increased to 240 mg p.o. daily.  There is been no problems with nausea or vomiting.  He has had no cough or shortness of breath.  He has had a little bit of leg swelling but this is chronic.  He has had no bleeding.  Overall, I would say that his performance status is probably ECOG 0.      Medications:  Allergies as of 11/08/2022       Reactions   Celexa [citalopram] Other (See Comments)   Contraindicated with A-Fib   Ibuprofen Other (See Comments)   Was told to not take this    Metformin And Related Other (See Comments)   Bloody stools    Naproxen Other (See Comments)   Was told to not take this    Yellow Jacket Venom [bee Venom] Swelling, Other (See Comments)   Severe swelling where stung   Penicillins Hives   Did it involve swelling of the face/tongue/throat, SOB, or low BP? Unk Did it involve sudden or severe rash/hives, skin peeling, or any  reaction on the inside of your mouth or nose? Yes Did you need to seek medical attention at a hospital or doctor's office? Unk When did it last happen? "Childhood- 55 years ago" If all above answers are "NO", may proceed with cephalosporin use.        Medication List        Accurate as of November 08, 2022  2:02 PM. If you have any questions, ask your nurse or doctor.          diltiazem 240 MG 24 hr capsule Commonly known as: CARDIZEM CD TAKE 1 CAPSULE BY MOUTH EVERY DAY   Eliquis 5 MG Tabs tablet Generic drug: apixaban TAKE 1 TABLET BY MOUTH TWICE A DAY   ezetimibe 10 MG tablet Commonly known as: ZETIA Take 1 tablet by mouth once daily   fluticasone 50 MCG/ACT nasal spray Commonly known as: Flonase Place 1 spray into both nostrils 2 (two) times daily as needed (sinus congestion).   furosemide 40 MG tablet Commonly known as: LASIX TAKE 1 TABLET BY MOUTH EVERY DAY   glimepiride 1 MG tablet Commonly known as: AMARYL Take 1 mg by mouth daily.   glucose blood test strip Monitor BS before meals and at bedtime for a couple of weeks. Then may decrease to bid before meals   Klor-Con M20 20 MEQ tablet Generic drug: potassium chloride SA  TAKE 2 TABLETS BY MOUTH EVERY DAY   metoprolol succinate 50 MG 24 hr tablet Commonly known as: TOPROL-XL TAKE 1 TABLET BY MOUTH DAILY. TAKE WITH OR IMMEDIATELY FOLLOWING A MEAL.   nitroGLYCERIN 0.4 MG SL tablet Commonly known as: NITROSTAT Place 1 tablet (0.4 mg total) under the tongue every 5 (five) minutes as needed for chest pain.   onetouch ultrasoft lancets Use as instructed   pramoxine-hydrocortisone 1-1 % rectal cream Commonly known as: PROCTOCREAM-HC Place 1 application rectally 2 (two) times daily as needed for hemorrhoids or anal itching.        Allergies:  Allergies  Allergen Reactions   Celexa [Citalopram] Other (See Comments)    Contraindicated with A-Fib   Ibuprofen Other (See Comments)    Was told to not take  this    Metformin And Related Other (See Comments)    Bloody stools    Naproxen Other (See Comments)    Was told to not take this    Yellow Jacket Venom [Bee Venom] Swelling and Other (See Comments)    Severe swelling where stung   Penicillins Hives    Did it involve swelling of the face/tongue/throat, SOB, or low BP? Unk Did it involve sudden or severe rash/hives, skin peeling, or any reaction on the inside of your mouth or nose? Yes Did you need to seek medical attention at a hospital or doctor's office? Unk When did it last happen? "Childhood- 55 years ago" If all above answers are "NO", may proceed with cephalosporin use.     Past Medical History, Surgical history, Social history, and Family History were reviewed and updated.  Review of Systems: . Review of Systems  Constitutional: Negative.   HENT: Negative.    Eyes: Negative.   Respiratory: Negative.    Cardiovascular: Negative.   Gastrointestinal: Negative.   Genitourinary: Negative.   Musculoskeletal:  Positive for joint pain.  Skin: Negative.   Neurological: Negative.   Endo/Heme/Allergies: Negative.   Psychiatric/Behavioral: Negative.       Physical Exam:  height is 5\' 10"  (1.778 m) and weight is 220 lb (99.8 kg). His oral temperature is 98.6 F (37 C). His blood pressure is 133/80 and his pulse is 47 (abnormal). His respiration is 16 and oxygen saturation is 98%.   Wt Readings from Last 3 Encounters:  11/08/22 220 lb (99.8 kg)  10/26/22 226 lb (102.5 kg)  09/27/22 223 lb 4 oz (101.3 kg)    Physical Exam Vitals reviewed.  HENT:     Head: Normocephalic and atraumatic.  Eyes:     Pupils: Pupils are equal, round, and reactive to light.  Cardiovascular:     Rate and Rhythm: Normal rate and regular rhythm.     Heart sounds: Normal heart sounds.  Pulmonary:     Effort: Pulmonary effort is normal.     Breath sounds: Normal breath sounds.  Abdominal:     General: Bowel sounds are normal.     Palpations:  Abdomen is soft.  Musculoskeletal:        General: No tenderness or deformity. Normal range of motion.     Cervical back: Normal range of motion.     Comments: He does have some swelling in the fingers of his right hand.  The main finger is his second finger.  This is quite swollen.  He does have some limited range of motion of his fingers.  It is hard for him to make a fist.  Lymphadenopathy:     Cervical:  No cervical adenopathy.  Skin:    General: Skin is warm and dry.     Findings: No erythema or rash.  Neurological:     Mental Status: He is alert and oriented to person, place, and time.  Psychiatric:        Behavior: Behavior normal.        Thought Content: Thought content normal.        Judgment: Judgment normal.     Lab Results  Component Value Date   WBC 9.9 11/08/2022   HGB 13.9 11/08/2022   HCT 40.5 11/08/2022   MCV 87.9 11/08/2022   PLT 227 11/08/2022   Lab Results  Component Value Date   FERRITIN 374 (H) 02/14/2021   IRON 125 02/14/2021   TIBC 329 02/14/2021   UIBC 204 02/14/2021   IRONPCTSAT 38 02/14/2021   Lab Results  Component Value Date   RETICCTPCT 5.4 (H) 08/15/2020   RBC 4.61 11/08/2022   No results found for: "KPAFRELGTCHN", "LAMBDASER", "KAPLAMBRATIO" No results found for: "IGGSERUM", "IGA", "IGMSERUM" No results found for: "TOTALPROTELP", "ALBUMINELP", "A1GS", "A2GS", "BETS", "BETA2SER", "GAMS", "MSPIKE", "SPEI"   Chemistry      Component Value Date/Time   NA 140 11/08/2022 1304   NA 140 08/28/2019 0940   K 3.5 11/08/2022 1304   CL 103 11/08/2022 1304   CO2 28 11/08/2022 1304   BUN 19 11/08/2022 1304   BUN 21 08/28/2019 0940   CREATININE 1.11 11/08/2022 1304      Component Value Date/Time   CALCIUM 9.5 11/08/2022 1304   ALKPHOS 93 11/08/2022 1304   AST 11 (L) 11/08/2022 1304   ALT 10 11/08/2022 1304   BILITOT 0.9 11/08/2022 1304       Impression and Plan: Mr. Kozlov is a pleasant 67 yo caucasian gentleman with  history of CVA  secondary to atrial fib and rectal bleeding. He was diagnosed with metastatic adenocarcinoma of the rectum -K-ras wild type/BRAF wild-type/HER-2 negative/MMR proficient. He had adenopathy distant to the rectum.   So far, we have not found any evidence of disease recurrence.  We will follow his CEA levels.  As long as the CEA level is normal, I do not see that we have to do any scans on him.  Hopefully, the atrial fibrillation is not going to be a problem.  He still has a Port-A-Cath in so we have to flush this.  I will still plan to get him back in another 3 months.   Josph Macho, MD 7/11/20242:02 PM

## 2022-12-01 ENCOUNTER — Other Ambulatory Visit: Payer: Self-pay | Admitting: Internal Medicine

## 2022-12-19 ENCOUNTER — Other Ambulatory Visit: Payer: Self-pay | Admitting: Internal Medicine

## 2022-12-20 ENCOUNTER — Inpatient Hospital Stay: Payer: Medicare HMO | Attending: Hematology & Oncology

## 2022-12-20 VITALS — BP 129/85 | HR 80 | Temp 98.4°F | Resp 18

## 2022-12-20 DIAGNOSIS — Z85048 Personal history of other malignant neoplasm of rectum, rectosigmoid junction, and anus: Secondary | ICD-10-CM | POA: Diagnosis present

## 2022-12-20 DIAGNOSIS — Z452 Encounter for adjustment and management of vascular access device: Secondary | ICD-10-CM | POA: Diagnosis not present

## 2022-12-20 DIAGNOSIS — C20 Malignant neoplasm of rectum: Secondary | ICD-10-CM

## 2022-12-20 MED ORDER — HEPARIN SOD (PORK) LOCK FLUSH 100 UNIT/ML IV SOLN
500.0000 [IU] | Freq: Once | INTRAVENOUS | Status: AC
  Administered 2022-12-20: 500 [IU] via INTRAVENOUS

## 2022-12-20 MED ORDER — SODIUM CHLORIDE 0.9% FLUSH
10.0000 mL | INTRAVENOUS | Status: DC | PRN
  Administered 2022-12-20: 10 mL via INTRAVENOUS

## 2022-12-20 NOTE — Patient Instructions (Signed)

## 2023-01-09 ENCOUNTER — Other Ambulatory Visit: Payer: Self-pay | Admitting: Internal Medicine

## 2023-01-10 ENCOUNTER — Encounter: Payer: Self-pay | Admitting: Internal Medicine

## 2023-01-10 ENCOUNTER — Ambulatory Visit: Payer: Medicare HMO | Attending: Internal Medicine | Admitting: Internal Medicine

## 2023-01-10 VITALS — BP 148/90 | HR 86 | Ht 70.0 in | Wt 215.4 lb

## 2023-01-10 DIAGNOSIS — Z789 Other specified health status: Secondary | ICD-10-CM

## 2023-01-10 DIAGNOSIS — I2510 Atherosclerotic heart disease of native coronary artery without angina pectoris: Secondary | ICD-10-CM | POA: Diagnosis not present

## 2023-01-10 DIAGNOSIS — M5440 Lumbago with sciatica, unspecified side: Secondary | ICD-10-CM

## 2023-01-10 DIAGNOSIS — I4819 Other persistent atrial fibrillation: Secondary | ICD-10-CM | POA: Diagnosis not present

## 2023-01-10 DIAGNOSIS — E785 Hyperlipidemia, unspecified: Secondary | ICD-10-CM

## 2023-01-10 DIAGNOSIS — R109 Unspecified abdominal pain: Secondary | ICD-10-CM | POA: Diagnosis not present

## 2023-01-10 DIAGNOSIS — I480 Paroxysmal atrial fibrillation: Secondary | ICD-10-CM

## 2023-01-10 DIAGNOSIS — C20 Malignant neoplasm of rectum: Secondary | ICD-10-CM

## 2023-01-10 NOTE — Progress Notes (Signed)
Cardiology Office Note:  .   Date:  01/11/2023  ID:  John Douglas, DOB Feb 05, 1956, MRN [id number] PCP: Ailene Ravel, MD  [facility] Providers Cardiologist:  Yvonne Kendall, MD     History of Present Illness: .   John Douglas is a 67 y.o. male with history of CAD with inferior STEMI and primary PCI to the RCA in 03/2019 complicated by sustained ventricular tachycardia in the setting of residual left coronary artery disease status post subsequent PCI to the mid LAD (06/2019), hypertension, hyperlipidemia, persistent atrial fibrillation complicated by stroke in 09/2019, ischemic cardiomyopathy, type 2 diabetes mellitus, and recurrent GI bleeding with subsequent diagnosis of rectal cancer s/p neoadjuvant chemotherapy and XRT complicated by colonic stricture (undergoing surveillance with general surgery and oncology), who presents for follow-up of CAD and a-fib.  He was last seen in our office in late June by John Listen, PA, at which time he was feeling well.  Prior event monitor showed persistent atrial fibrillation.  He declined DCCV and wished to continue with a rate-control strategy.  Today, John Douglas reports that he has been having significant back pain for the last 2 weeks.  He has had issues with his back off and on for decades following a remote accident.  He believes he may have stressed his back about a month ago when trying to stand up.  He would like to use NSAIDs but has been avoiding these on account of his anticoagulation.  He notes that acetaminophen does not help much.  He also had significant abdominal discomfort after eating a salad a week or 2 ago.  He had dry heaves and wonders if he may have pulled an abdominal muscle.  He recently saw a masseuse who commented on tense muscles in the right lower quadrant.  Today, his back and abdominal pain is better, though he still has a "dull nagging pain."  From a heart standpoint, John Douglas has not had any symptoms, denying chest  pain, shortness of breath, palpitations, lightheadedness, and edema.  He remains compliant with his medications.  ROS: See HPI  Studies Reviewed: Marland Kitchen   EKG Interpretation Date/Time:  Thursday January 10 2023 16:40:31 EDT Ventricular Rate:  90 PR Interval:    QRS Duration:  106 QT Interval:  370 QTC Calculation: 452 R Axis:   -25  Text Interpretation: Atrial fibrillation with premature ventricular or aberrantly conducted complexes Cannot rule out Inferior infarct (cited on or before 10-Jan-2023) Cannot rule out Anterior infarct , age undetermined When compared with ECG of 26-Oct-2022 14:02, Vent. rate has increased Premature ventricular complexes versus aberrantly conducted complexes are now present Poor R wave progression is now Present Confirmed by Skylier Kretschmer [phone]) on 01/11/2023 11:20:51 AM    Risk Assessment/Calculations:    CHA2DS2-VASc Score = 6   This indicates a 9.7% annual risk of stroke. The patient's score is based upon: CHF History: 0 HTN History: 1 Diabetes History: 1 Stroke History: 2 Vascular Disease History: 1 Age Score: 1 Gender Score: 0    HYPERTENSION CONTROL Vitals:   01/10/23 1552 01/10/23 1603 01/10/23 1625  BP: (!) 148/100 (!) 169/105 (!) 148/90    The patient's blood pressure is elevated above target today.  In order to address the patient's elevated BP: Blood pressure will be monitored at home to determine if medication changes need to be made.          Physical Exam:   VS:  BP (!) 148/90 (BP Location: Left Arm)  Pulse 86   Ht 5\' 10"  (1.778 m)   Wt 215 lb 6.4 oz (97.7 kg)   SpO2 98%   BMI 30.91 kg/m    Wt Readings from Last 3 Encounters:  01/10/23 215 lb 6.4 oz (97.7 kg)  11/08/22 220 lb (99.8 kg)  10/26/22 226 lb (102.5 kg)    General:  NAD. Neck: No JVD or HJR. Lungs: Clear to auscultation bilaterally without wheezes or crackles. Heart: Irregularly irregular rhythm without murmurs, rubs, or gallops. Abdomen: Soft, nontender,  nondistended. Extremities: No lower extremity edema.  ASSESSMENT AND PLAN: .    Abdominal and low back pain: This has been present for a couple of weeks.  Low back pain is not a new issue and dates back to a remote accident.  However, it seems to have been more severe this time around.  Given his history of rectal cancer, we will proceed with an expedited CT of the abdomen and pelvis to ensure that there is not recurrence of his malignancy or other lower abdominal/pelvic pathology to explain his symptoms.  He can use NSAIDs sparingly or try a topical treatment to help with his back pain.  Coronary artery disease: No angina reported.  Continue current medications for secondary prevention, including apixaban and lieu of aspirin given history of atrial fibrillation.  Persistent atrial fibrillation: Ventricular rates remain adequately controlled.  Mr. Blok does not wish to pursue a rhythm control strategy.  Continue current doses of metoprolol and diltiazem as well as long-term anticoagulation with apixaban.  Rectal cancer: This has been in remission following chemotherapy and radiation.  However, with new low back and abdominal/pelvic pain, we will obtain a CT of the abdomen pelvis as soon as possible.  If this is unrevealing, Mr. Wieber should continue his routine follow-up with Dr. Myna Hidalgo.  Hyperlipidemia with statin intolerance: Continue ezetimibe 10 mg daily as Mr. Vivian has been intolerant of statins in the past.  LDL on last check in April was above goal at 104.  We will need to readdress this at follow-up, including trial of a PCSK9 inhibitor.    Dispo: Return to clinic in 3 months.  Signed, Yvonne Kendall, MD

## 2023-01-10 NOTE — Patient Instructions (Addendum)
Medication Instructions:  Your physician recommends that you continue on your current medications as directed. Please refer to the Current Medication list given to you today.   *If you need a refill on your cardiac medications before your next appointment, please call your pharmacy*   Lab Work: No labs ordered today    Testing/Procedures: CT Abdomen/ Pelvis scanning, (CAT scanning), is a noninvasive, special x-ray that produces cross-sectional images of the body using x-rays and a computer. CT scans help physicians diagnose and treat medical conditions. For some CT exams, a contrast material is used to enhance visibility in the area of the body being studied. CT scans provide greater clarity and reveal more details than regular x-ray exams. [facility]   Follow-Up: At [facility], you and your health needs are our priority.  As part of our continuing mission to provide you with exceptional heart care, we have created designated Provider Care Teams.  These Care Teams include your primary Cardiologist (physician) and Advanced Practice Providers (APPs -  Physician Assistants and Nurse Practitioners) who all work together to provide you with the care you need, when you need it.  We recommend signing up for the patient portal called "MyChart".  Sign up information is provided on this After Visit Summary.  MyChart is used to connect with patients for Virtual Visits (Telemedicine).  Patients are able to view lab/test results, encounter notes, upcoming appointments, etc.  Non-urgent messages can be sent to your provider as well.   To learn more about what you can do with MyChart, go to [email or web address].    Your next appointment:   3 month(s)  Provider:   You may see Yvonne Kendall, MD or one of the following Advanced Practice Providers on your designated Care Team:   Nicolasa Ducking, NP Eula Listen, PA-C Cadence Fransico Michael, PA-C Charlsie Quest, NP

## 2023-01-11 DIAGNOSIS — R109 Unspecified abdominal pain: Secondary | ICD-10-CM | POA: Insufficient documentation

## 2023-01-11 DIAGNOSIS — M5440 Lumbago with sciatica, unspecified side: Secondary | ICD-10-CM | POA: Insufficient documentation

## 2023-01-24 ENCOUNTER — Ambulatory Visit
Admission: RE | Admit: 2023-01-24 | Discharge: 2023-01-24 | Disposition: A | Discharge status: Home / Self Care | Payer: Medicare HMO | Source: Ambulatory Visit | Attending: Internal Medicine | Admitting: Internal Medicine

## 2023-01-24 DIAGNOSIS — C20 Malignant neoplasm of rectum: Secondary | ICD-10-CM | POA: Insufficient documentation

## 2023-01-24 DIAGNOSIS — R109 Unspecified abdominal pain: Secondary | ICD-10-CM | POA: Diagnosis present

## 2023-01-24 DIAGNOSIS — M5440 Lumbago with sciatica, unspecified side: Secondary | ICD-10-CM | POA: Diagnosis present

## 2023-01-24 LAB — POCT I-STAT CREATININE: Creatinine, Ser: 1.2 mg/dL (ref 0.61–1.24)

## 2023-01-24 MED ORDER — IOHEXOL 300 MG/ML  SOLN
100.0000 mL | Freq: Once | INTRAMUSCULAR | Status: AC | PRN
  Administered 2023-01-24: 100 mL via INTRAVENOUS

## 2023-01-29 ENCOUNTER — Encounter: Payer: Self-pay | Admitting: Internal Medicine

## 2023-01-29 NOTE — Telephone Encounter (Signed)
CT A/P reviewed with Mr. Borkenhagen by phone.  He reports that his back pain is still there but has improved since it began.  I suspect back pain could be due to degenerative disc disease noted in the lumbar spine.  However, progressive retroperitoneal and lower thoracic lymphadenopathy could be contributing and is concerning for possible recurrent malignancy.  Mr. Adorno already has follow-up with Dr. Myna Hidalgo on 02/13/2023.  I will reach out to Dr. Myna Hidalgo and inform him of the results of the CT scan.  Yvonne Kendall, MD [facility]

## 2023-01-31 ENCOUNTER — Other Ambulatory Visit: Payer: Self-pay | Admitting: Hematology & Oncology

## 2023-01-31 ENCOUNTER — Other Ambulatory Visit: Payer: Self-pay | Admitting: Physician Assistant

## 2023-01-31 DIAGNOSIS — C20 Malignant neoplasm of rectum: Secondary | ICD-10-CM

## 2023-01-31 DIAGNOSIS — Z85048 Personal history of other malignant neoplasm of rectum, rectosigmoid junction, and anus: Secondary | ICD-10-CM

## 2023-02-08 ENCOUNTER — Other Ambulatory Visit: Payer: Medicare HMO

## 2023-02-08 ENCOUNTER — Ambulatory Visit: Payer: Medicare HMO | Admitting: Hematology & Oncology

## 2023-02-08 ENCOUNTER — Encounter (HOSPITAL_COMMUNITY)
Admission: RE | Admit: 2023-02-08 | Discharge: 2023-02-08 | Disposition: A | Discharge status: Home / Self Care | Payer: Medicare HMO | Source: Ambulatory Visit | Attending: Hematology & Oncology | Admitting: Hematology & Oncology

## 2023-02-08 DIAGNOSIS — C20 Malignant neoplasm of rectum: Secondary | ICD-10-CM | POA: Diagnosis present

## 2023-02-08 LAB — GLUCOSE, CAPILLARY: Glucose-Capillary: 115 mg/dL — ABNORMAL HIGH (ref 70–99)

## 2023-02-08 MED ORDER — FLUDEOXYGLUCOSE F - 18 (FDG) INJECTION
11.0000 | Freq: Once | INTRAVENOUS | Status: AC | PRN
  Administered 2023-02-08: 11.5 via INTRAVENOUS

## 2023-02-11 LAB — HEMOGLOBIN A1C: Hemoglobin A1C: 6

## 2023-02-11 LAB — PROTEIN / CREATININE RATIO, URINE
Albumin, U: 31.6
Creatinine, Urine: 181.7

## 2023-02-11 LAB — MICROALBUMIN / CREATININE URINE RATIO: Microalb Creat Ratio: 17

## 2023-02-13 ENCOUNTER — Inpatient Hospital Stay: Payer: Medicare HMO

## 2023-02-13 ENCOUNTER — Inpatient Hospital Stay: Payer: Medicare HMO | Attending: Hematology & Oncology

## 2023-02-13 ENCOUNTER — Inpatient Hospital Stay: Payer: Medicare HMO | Admitting: Hematology & Oncology

## 2023-02-13 ENCOUNTER — Encounter: Payer: Self-pay | Admitting: Hematology & Oncology

## 2023-02-13 VITALS — BP 125/72 | HR 74 | Temp 97.8°F | Resp 20 | Ht 70.0 in | Wt 213.0 lb

## 2023-02-13 DIAGNOSIS — R599 Enlarged lymph nodes, unspecified: Secondary | ICD-10-CM | POA: Insufficient documentation

## 2023-02-13 DIAGNOSIS — Z8673 Personal history of transient ischemic attack (TIA), and cerebral infarction without residual deficits: Secondary | ICD-10-CM | POA: Insufficient documentation

## 2023-02-13 DIAGNOSIS — C20 Malignant neoplasm of rectum: Secondary | ICD-10-CM | POA: Diagnosis present

## 2023-02-13 DIAGNOSIS — D500 Iron deficiency anemia secondary to blood loss (chronic): Secondary | ICD-10-CM

## 2023-02-13 DIAGNOSIS — I4891 Unspecified atrial fibrillation: Secondary | ICD-10-CM | POA: Diagnosis not present

## 2023-02-13 DIAGNOSIS — Z7901 Long term (current) use of anticoagulants: Secondary | ICD-10-CM | POA: Insufficient documentation

## 2023-02-13 LAB — CMP (CANCER CENTER ONLY)
ALT: 8 U/L (ref 0–44)
AST: 12 U/L — ABNORMAL LOW (ref 15–41)
Albumin: 4.2 g/dL (ref 3.5–5.0)
Alkaline Phosphatase: 119 U/L (ref 38–126)
Anion gap: 10 (ref 5–15)
BUN: 22 mg/dL (ref 8–23)
CO2: 30 mmol/L (ref 22–32)
Calcium: 9.7 mg/dL (ref 8.9–10.3)
Chloride: 101 mmol/L (ref 98–111)
Creatinine: 1.14 mg/dL (ref 0.61–1.24)
GFR, Estimated: >60 mL/min (ref >60)
Glucose, Bld: 119 mg/dL — ABNORMAL HIGH (ref 70–99)
Potassium: 3.3 mmol/L — ABNORMAL LOW (ref 3.5–5.1)
Sodium: 141 mmol/L (ref 135–145)
Total Bilirubin: 0.9 mg/dL (ref 0.3–1.2)
Total Protein: 7 g/dL (ref 6.5–8.1)

## 2023-02-13 LAB — CBC WITH DIFFERENTIAL (CANCER CENTER ONLY)
Abs Immature Granulocytes: 0.04 10*3/uL (ref 0.00–0.07)
Basophils Absolute: 0.1 10*3/uL (ref 0.0–0.1)
Basophils Relative: 1 %
Eosinophils Absolute: 0.2 10*3/uL (ref 0.0–0.5)
Eosinophils Relative: 2 %
HCT: 40.1 % (ref 39.0–52.0)
Hemoglobin: 13.9 g/dL (ref 13.0–17.0)
Immature Granulocytes: 0 %
Lymphocytes Relative: 17 %
Lymphs Abs: 1.8 10*3/uL (ref 0.7–4.0)
MCH: 29.8 pg (ref 26.0–34.0)
MCHC: 34.7 g/dL (ref 30.0–36.0)
MCV: 86.1 fL (ref 80.0–100.0)
Monocytes Absolute: 0.7 10*3/uL (ref 0.1–1.0)
Monocytes Relative: 7 %
Neutro Abs: 7.6 10*3/uL (ref 1.7–7.7)
Neutrophils Relative %: 73 %
Platelet Count: 240 10*3/uL (ref 150–400)
RBC: 4.66 MIL/uL (ref 4.22–5.81)
RDW: 15.9 % — ABNORMAL HIGH (ref 11.5–15.5)
WBC Count: 10.3 10*3/uL (ref 4.0–10.5)
nRBC: 0 % (ref 0.0–0.2)

## 2023-02-13 LAB — CEA (IN HOUSE-CHCC): CEA (CHCC-In House): 2.65 ng/mL (ref 0.00–5.00)

## 2023-02-13 LAB — LACTATE DEHYDROGENASE: LDH: 220 U/L — ABNORMAL HIGH (ref 98–192)

## 2023-02-13 NOTE — Patient Instructions (Signed)

## 2023-02-13 NOTE — Progress Notes (Signed)
Hematology and Oncology Follow Up Visit  John Douglas [id number] Aug 22, 1955 67 y.o. 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 twice daily Aspirin 81 mg p.o. daily Zirabev (biosimilar Avastin)/Xeloda maintenance - started 10/12/2020 -- d/c on 11/2020 FOLFOX/Vectibix --start on 03/07/2023 Xgeva 120 mg subcu every 3 months-start 03/07/2023   Interim History:  John Douglas is here today for follow-up.  Unfortunately, looks like we have relapsed for him.  He really have not seen by his family doctor.  I think he had scans done.  Looks like that he had some new adenopathy.  We went ahead and did a PET scan on him.  This was done on 02/08/2023.  The PET scan clearly shows that he had hypermetabolic activity in the rectum.  He had a hypermetabolic activity in lower thoracic, retrocrural and retroperitoneal lymph nodes.  He also had a new lesion that was hypermetabolic at L5.  He has had chronic back issues.  I am not sure if this L5 lesion is causing problems.  However, we will have to get an MRI to see what might be going on.  At this point, we can have to get him back on treatment.  He has not had treatment now for about 2-3 years.  I think that would be reasonable would be FOLFOX/Vectibix.  I think this would be reasonable.  I told him about the skin side effects with the Vectibix.  Again, he seems to be in pretty good shape.  He has had no leg swelling.  He does have the atrial fibrillation.  He has not had any neurological issues since he had that CVA from the atrial fibrillation.  He is on anticoagulation with  Eliquis.  Currently, I would say his performance status is probably ECOG 1.    Medications:  Allergies as of 02/13/2023       Reactions   Celexa [citalopram] Other (See Comments)   Contraindicated with A-Fib   Ibuprofen Other (See Comments)   Was told to not take this    Metformin And Related Other (See Comments)   Bloody stools    Naproxen Other (See Comments)   Was told to not take this    Yellow Jacket Venom [bee Venom] Swelling, Other (See Comments)   Severe swelling where stung   Penicillins Hives   Did it involve swelling of the face/tongue/throat, SOB, or low BP? Unk Did it involve sudden or severe rash/hives, skin peeling, or any reaction on the inside of your mouth or nose? Yes Did you need to seek medical attention at a hospital or doctor's office? Unk When did it last happen? "Childhood- 55 years ago" If all above answers are "NO", may proceed with cephalosporin use.        Medication List        Accurate as of February 13, 2023  5:09 PM. If you have any questions, ask your nurse or doctor.          diltiazem 240 MG 24 hr capsule Commonly known as: CARDIZEM CD TAKE 1 CAPSULE BY MOUTH EVERY DAY   Eliquis 5 MG Tabs tablet Generic drug: apixaban TAKE 1 TABLET BY MOUTH TWICE A DAY  ezetimibe 10 MG tablet Commonly known as: ZETIA Take 1 tablet by mouth once daily   fluticasone 50 MCG/ACT nasal spray Commonly known as: Flonase Place 1 spray into both nostrils 2 (two) times daily as needed (sinus congestion).   furosemide 40 MG tablet Commonly known as: LASIX TAKE 1 TABLET BY MOUTH EVERY DAY   glimepiride 1 MG tablet Commonly known as: AMARYL Take 1 mg by mouth daily.   Klor-Con M20 20 MEQ tablet Generic drug: potassium chloride SA TAKE 2 TABLETS BY MOUTH EVERY DAY   metoprolol succinate 50 MG 24 hr tablet Commonly known as: TOPROL-XL TAKE 1 TABLET BY MOUTH EVERY DAY WITH OR IMMEDIATELY FOLLOWING A MEAL   nitroGLYCERIN 0.4 MG SL  tablet Commonly known as: NITROSTAT Place 1 tablet (0.4 mg total) under the tongue every 5 (five) minutes as needed for chest pain.   pramoxine-hydrocortisone 1-1 % rectal cream Commonly known as: PROCTOCREAM-HC Place 1 application rectally 2 (two) times daily as needed for hemorrhoids or anal itching.        Allergies:  Allergies  Allergen Reactions   Celexa [Citalopram] Other (See Comments)    Contraindicated with A-Fib   Ibuprofen Other (See Comments)    Was told to not take this    Metformin And Related Other (See Comments)    Bloody stools    Naproxen Other (See Comments)    Was told to not take this    Yellow Jacket Venom [Bee Venom] Swelling and Other (See Comments)    Severe swelling where stung   Penicillins Hives    Did it involve swelling of the face/tongue/throat, SOB, or low BP? Unk Did it involve sudden or severe rash/hives, skin peeling, or any reaction on the inside of your mouth or nose? Yes Did you need to seek medical attention at a hospital or doctor's office? Unk When did it last happen? "Childhood- 53 years ago" If all above answers are "NO", may proceed with cephalosporin use.     Past Medical History, Surgical history, Social history, and Family History were reviewed and updated.  Review of Systems: . Review of Systems  Constitutional: Negative.   HENT: Negative.    Eyes: Negative.   Respiratory: Negative.    Cardiovascular: Negative.   Gastrointestinal: Negative.   Genitourinary: Negative.   Musculoskeletal:  Positive for joint pain.  Skin: Negative.   Neurological: Negative.   Endo/Heme/Allergies: Negative.   Psychiatric/Behavioral: Negative.       Physical Exam:  height is 5\' 10"  (1.778 m) and weight is 213 lb (96.6 kg). His oral temperature is 97.8 F (36.6 C). His blood pressure is 125/72 and his pulse is 74. His respiration is 20 and oxygen saturation is 100%.   Wt Readings from Last 3 Encounters:  02/13/23 213 lb (96.6 kg)   01/10/23 215 lb 6.4 oz (97.7 kg)  11/08/22 220 lb (99.8 kg)    Physical Exam Vitals reviewed.  HENT:     Head: Normocephalic and atraumatic.  Eyes:     Pupils: Pupils are equal, round, and reactive to light.  Cardiovascular:     Rate and Rhythm: Normal rate and regular rhythm.     Heart sounds: Normal heart sounds.  Pulmonary:     Effort: Pulmonary effort is normal.     Breath sounds: Normal breath sounds.  Abdominal:     General: Bowel sounds are normal.     Palpations: Abdomen is soft.  Musculoskeletal:        General: No  tenderness or deformity. Normal range of motion.     Cervical back: Normal range of motion.     Comments: He does have some swelling in the fingers of his right hand.  The main finger is his second finger.  This is quite swollen.  He does have some limited range of motion of his fingers.  It is hard for him to make a fist.  Lymphadenopathy:     Cervical: No cervical adenopathy.  Skin:    General: Skin is warm and dry.     Findings: No erythema or rash.  Neurological:     Mental Status: He is alert and oriented to person, place, and time.  Psychiatric:        Behavior: Behavior normal.        Thought Content: Thought content normal.        Judgment: Judgment normal.     Lab Results  Component Value Date   WBC 10.3 02/13/2023   HGB 13.9 02/13/2023   HCT 40.1 02/13/2023   MCV 86.1 02/13/2023   PLT 240 02/13/2023   Lab Results  Component Value Date   FERRITIN 374 (H) 02/14/2021   IRON 125 02/14/2021   TIBC 329 02/14/2021   UIBC 204 02/14/2021   IRONPCTSAT 38 02/14/2021   Lab Results  Component Value Date   RETICCTPCT 5.4 (H) 08/15/2020   RBC 4.66 02/13/2023   No results found for: "KPAFRELGTCHN", "LAMBDASER", "KAPLAMBRATIO" No results found for: "IGGSERUM", "IGA", "IGMSERUM" No results found for: "TOTALPROTELP", "ALBUMINELP", "A1GS", "A2GS", "BETS", "BETA2SER", "GAMS", "MSPIKE", "SPEI"   Chemistry      Component Value Date/Time   NA  141 02/13/2023 1500   NA 140 08/28/2019 0940   K 3.3 (L) 02/13/2023 1500   CL 101 02/13/2023 1500   CO2 30 02/13/2023 1500   BUN 22 02/13/2023 1500   BUN 21 08/28/2019 0940   CREATININE 1.14 02/13/2023 1500      Component Value Date/Time   CALCIUM 9.7 02/13/2023 1500   ALKPHOS 119 02/13/2023 1500   AST 12 (L) 02/13/2023 1500   ALT 8 02/13/2023 1500   BILITOT 0.9 02/13/2023 1500       Impression and Plan: John Douglas is a pleasant 67 yo caucasian gentleman with  history of CVA secondary to atrial fib and rectal bleeding. He was diagnosed with metastatic adenocarcinoma of the rectum -K-ras wild type/BRAF wild-type/HER-2 negative/MMR proficient. He had adenopathy distant to the rectum.   Now, we have recurrent disease by PET scan.  We will see what his CEA level looks like.  Again, we will have to get him started on treatment.  I would like to get an MRI of the thoracic spine and lumbar spine so we can see exactly the extent of any metastatic disease.  Perini other there is disease at L5 on PET scan.  I do not think we need any radiation therapy for the L5 lesion at the present time.  He really is not having any increased pain in that region.  We will get him on some Xgeva.  Again, we will try him on FOLFOX/Vectibix.  I think this is a reasonable protocol for him.  I know he did well with the FOLFOX by itself.  Now that we know that he has a wild-type K-ras G, we can add Vectibix.  He already has a Port-A-Cath and so we do not have to worry about this.  We will try to get treatment started in 2 or 3 weeks.  We will give him 4 cycles and then repeat the PET scan.  It would be nice to see what his CEA level is.  We will plan to get him back probably for the second cycle of treatment.  I do not think we have to see him for the first cycle.  Josph Macho, MD 10/16/20245:09 PM

## 2023-02-13 NOTE — Progress Notes (Signed)
ON PATHWAY REGIMEN - Colorectal  No Change  Continue With Treatment as Ordered.  Original Decision Date/Time: 11/17/2019 15:23     A cycle is every 14 days:     Panitumumab      Oxaliplatin      Leucovorin      Fluorouracil      Fluorouracil   **Always confirm dose/schedule in your pharmacy ordering system**  Patient Characteristics: Distant Metastases, Nonsurgical Candidate, KRAS/NRAS Wild-Type (BRAF V600 Wild-Type/Unknown), Standard Cytotoxic Therapy, First Line Standard Cytotoxic Therapy, Left-sided Tumor, PS = 0,1 Tumor Location: Rectal Therapeutic Status: Distant Metastases Microsatellite/Mismatch Repair Status: MSS/pMMR BRAF Mutation Status: Wild-Type (no mutation) KRAS/NRAS Mutation Status: Wild-Type (no mutation) Preferred Therapy Approach: Standard Cytotoxic Therapy Standard Cytotoxic Line of Therapy: First Line Standard Cytotoxic Therapy ECOG Performance Status: 1 Primary Tumor Location: Left-sided Tumor Intent of Therapy: Non-Curative / Palliative Intent, Discussed with Patient

## 2023-02-15 ENCOUNTER — Other Ambulatory Visit: Payer: Self-pay

## 2023-02-18 ENCOUNTER — Encounter: Payer: Self-pay | Admitting: Hematology & Oncology

## 2023-02-19 ENCOUNTER — Other Ambulatory Visit: Payer: Self-pay

## 2023-02-19 MED ORDER — HYDROCODONE-ACETAMINOPHEN 5-325 MG PO TABS: 1.0000 | ORAL_TABLET | Freq: Four times a day (QID) | ORAL | 0 refills | Status: AC | PRN

## 2023-02-20 ENCOUNTER — Ambulatory Visit (HOSPITAL_COMMUNITY)
Admission: RE | Admit: 2023-02-20 | Discharge: 2023-02-20 | Disposition: A | Discharge status: Home / Self Care | Payer: Medicare HMO | Source: Ambulatory Visit | Attending: Hematology & Oncology | Admitting: Hematology & Oncology

## 2023-02-20 DIAGNOSIS — C20 Malignant neoplasm of rectum: Secondary | ICD-10-CM | POA: Insufficient documentation

## 2023-02-20 MED ORDER — GADOBUTROL 1 MMOL/ML IV SOLN
9.6000 mL | Freq: Once | INTRAVENOUS | Status: AC | PRN
  Administered 2023-02-20: 9.6 mL via INTRAVENOUS

## 2023-02-26 ENCOUNTER — Other Ambulatory Visit: Payer: Self-pay | Admitting: Hematology & Oncology

## 2023-02-26 NOTE — Progress Notes (Unsigned)
Pharmacist Chemotherapy Monitoring - Initial Assessment    Anticipated start date: 03/05/23   The following has been reviewed per standard work regarding the patient's treatment regimen: The patient's diagnosis, treatment plan and drug doses, and organ/hematologic function Lab orders and baseline tests specific to treatment regimen  The treatment plan start date, drug sequencing, and pre-medications Prior authorization status  Patient's documented medication list, including drug-drug interaction screen and prescriptions for anti-emetics and supportive care specific to the treatment regimen The drug concentrations, fluid compatibility, administration routes, and timing of the medications to be used The patient's access for treatment and lifetime cumulative dose history, if applicable  The patient's medication allergies and previous infusion related reactions, if applicable   Changes made to treatment plan:  treatment plan date  Follow up needed:  N/A   John Douglas, Nevin Bloodgood, [facility], 02/26/2023  1:33 PM

## 2023-03-01 NOTE — Progress Notes (Signed)
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 with bone mets      [name] presented  symptoms of: Back pain  Biopsies of colon (if applicable) revealed:    Past/Anticipated interventions by surgeon, if any: colonoscopy performed in 2021  Past/Anticipated interventions by medical oncology, if any: yes, FOLFOX/Vectibix --start on 03/07/2023 Xgeva 120 mg subcu every 3 months-start 03/07/2023  Weight changes, if any: no Wt Readings from Last 3 Encounters:  03/04/23 212 lb 8 oz (96.4 kg)  02/13/23 213 lb (96.6 kg)  01/10/23 215 lb 6.4 oz (97.7 kg)     Bowel/Bladder complaints, if any: no  Nausea / Vomiting, if any: no  Pain issues, if any:  yes, back pain. Rates 3/10 at this time. Pain lessens with Hydrocodone- APAP  Any blood per rectum:   no  SAFETY ISSUES: Prior radiation? Yes 2021 Pacemaker/ICD? no Possible current pregnancy? no Is the patient on methotrexate? no  Current Complaints/Details:  BP (!) 148/85 (BP Location: Left Arm, Patient Position: Sitting)   Pulse (!) 58   Temp (!) 96.6 F (35.9 C) (Temporal)   Resp 18   Ht 5\' 10"  (1.778 m)   Wt 212 lb 8 oz (96.4 kg)   SpO2 99%   BMI 30.49 kg/m

## 2023-03-03 NOTE — Progress Notes (Signed)
Radiation Oncology         [phone]) [phone] ________________________________  Outpatient Re-Consultation  Name: John Douglas MRN: [id number]  Date: 03/04/2023  DOB: 02/14/1956  MW:[id number], Durward Fortes, MD  Josph Macho, MD   REFERRING PHYSICIAN: Josph Macho, MD  DIAGNOSIS: {There were no encounter diagnoses. (Refresh or delete this SmartLink)}  Stage T4a, N2 metastatic adenocarcinoma of the rectum diagnosed in 2021 : s/p primary chemotherapy and radiation therapy  - Now with recent PET evidence of recurrent disease along the rectosigmoid junction, along with hypermetabolic lower thoracic, retrocrural and retroperitoneal lymphadenopathy; and new osseous metastatic disease to L5  HISTORY OF PRESENT ILLNESS::John Douglas is a 67 y.o. male who is accompanied by ***. he is seen as a courtesy of Dr. Myna Hidalgo for re-consultation and for an opinion concerning radiation therapy as part of management for his recent recurrence of metastatic rectal cancer ***.   He was last seen here on 06/20/2020 for follow-up after completing radiation to the rectum on 04/27/2020. To review, he also finished his final cycle of Xeloda on 04/27/2020 under Dr. Myna Hidalgo. He did not require further follow-up with radiation oncology after our last visit and continued to meet with Dr. Myna Hidalgo for surveillance and maintenance 5-FU/Avastin which he began on 08/22/20.   Repeat PET on 08/12/20 showed no change in appearance of the primary, and a minimal decrease in hypermetabolism of the abdominal retroperitoneal, left, iliac, and retrocrural nodes, below mediastinal blood pool; favored to be reactive in etiology. Overall, no evidence of new or progressive disease was appreciated.   He was then transitioned to maintenance Xeloda/Avastin starting in July of 2022 which he soon after completed in August of 2022.   He was evaluated by general surgery in November of 2022 for consideration of resection. He was deemed to be a good  surgical candidate but the patient was hesitant to consider surgery due to concerns of having a colostomy.   For restaging of his disease, he presented for a PET scan on 05/12/21 which showed no evidence of residual or recurrent metabolically active tumor in the rectum, and no evidence of metastatic disease in the chest, abdomen. or pelvis. PET did show a small focus of hypermetabolism in left lower lobe but no CT correlate.  He continued on surveillance and presented for a restaging PET scan on 09/20/21 which showed no evidence of active malignancy.    Based on PET findings showing NED and the fact that his CEA remained stable, Dr. Myna Hidalgo recommended holding off on repeat PET scans unless prompted by changing CEA levels.   He continued to remain stable from a disease standpoint for the remainder of 2023 and into 2024 (that is, until his recent recurrence, detailed as follows).   Recent recurrence:   The patient presented to his cardiologist for routine follow-up on 01/10/23 with c/o new onset lower back pain  and abdominal pain.   His symptoms prompted a CT AP with contrast on 01/24/23 which showed: worsening retroperitoneal adenopathy and lower thoracic adenopathy, suspicious for active recurrence, and a narrow appearing segment of the upper rectum correlating with the prior region of hypermetabolic activity and presumed tumor. Other findings of potential clinical significance included: nonspecific circumferential distal esophageal wall thickening ; nonobstructive left nephrolithiasis; borderline prostatomegaly; a small direct left inguinal hernia; and evidence of degenerative disc disease and facet arthropathy contributing to bilateral foraminal impingement at L5-S1 and left foraminal impingement at L4-5.   He subsequently presented for a PET scan on 02/08/23  which showed: hypermetabolism along the rectosigmoid junction in the area of the patient's prior neoplasm suspected to represent recurrent  tumor; hypermetabolic lower thoracic, retrocrural and retroperitoneal lymphadenopathy; and a new hypermetabolic L5 vertebral body lesion consistent with metastatic disease. PET otherwise showed no other sites concerning for metastatic disease.   To further evaluate the findings at L5, an MRI of the thoracic and lumbar spine was performed on 02/20/23 which confirmed osseous metastatic disease to L5, without evidence of pathologic fracture. No other sites of metastatic disease to the thoracic or lumbar spine were appreciated. Other notable findings included moderate spinal canal stenosis and moderate right neural foraminal stenosis at the level of L3-4, and severe bilateral neural foraminal stenosis at the level of L5-S1.   In terms of systemic treatment, Dr. Myna Hidalgo has recommended chemotherapy consisting of FOLFOX/Vectibix. Dr. Myna Hidalgo will also start him on Xgeva. He is tentatively scheduled to start treatment on 03/07/23.    PREVIOUS RADIATION THERAPY: Yes   Radiation Treatment Dates: 04/06/2020 through 04/27/2020   Site / Technique / Total Dose (Gy)  / Dose per Fx (Gy) / Completed Fx / Beam Energies Rectum / 3D. A total of 37.5 Gy given in 2.5 Gy per fraction for a total of 15 fractions. 6X, 15X  PAST MEDICAL HISTORY:  Past Medical History:  Diagnosis Date   Allergic rhinitis    [facility]   Anxiety    Benign essential hypertension    [facility]   Coronary artery disease    [facility]   Diabetic neuropathy, type II diabetes mellitus (HCC)    [facility]   Dysrhythmia    Iron deficiency anemia due to chronic blood loss 11/18/2019   Mixed hyperlipidemia    [facility]   Myocardial infarct [facility])    [facility]   Obesity, unspecified    [facility]   Persistent atrial fibrillation [facility]    [facility]    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) 11/17/2019   Stroke (HCC)    Wide-complex tachycardia 06/04/2019    PAST SURGICAL HISTORY: Past Surgical History:  Procedure Laterality Date   BIOPSY  10/07/2019   Procedure: BIOPSY;  Surgeon: Shellia Cleverly, DO;  Location: [facility];  Service: Gastroenterology;;   COLONOSCOPY WITH PROPOFOL N/A 10/07/2019   Procedure: COLONOSCOPY WITH PROPOFOL;  Surgeon: Shellia Cleverly, DO;  Location: [facility];  Service: Gastroenterology;  Laterality: N/A;   CORONARY ANGIOPLASTY     3 stents   IR IMAGING GUIDED PORT INSERTION  11/24/2019   LEFT HEART CATH AND CORONARY ANGIOGRAPHY N/A 06/05/2019   Procedure: LEFT HEART CATH AND CORONARY ANGIOGRAPHY;  Surgeon: Yvonne Kendall, MD;  Location: [facility];  Service: Cardiovascular;  Laterality: N/A;   POLYPECTOMY  10/07/2019   Procedure: POLYPECTOMY;  Surgeon: Shellia Cleverly, DO;  Location: [facility];  Service: Gastroenterology;;   SUBMUCOSAL TATTOO INJECTION  10/07/2019   Procedure: SUBMUCOSAL TATTOO INJECTION;  Surgeon: Shellia Cleverly, DO;  Location: [facility];  Service: Gastroenterology;;    FAMILY HISTORY:  Family History  Problem Relation Age of Onset   Diverticulitis Mother    Hypertension Father    Heart disease Father        MI   Prostate cancer Father    Colon cancer Neg Hx    Stomach cancer Neg Hx    Pancreatic cancer  Neg Hx    Rectal cancer Neg Hx     SOCIAL HISTORY:  Social History   Tobacco Use   Smoking status: Never   Smokeless tobacco: Former    Types: Chew    Quit date: 05/04/2019  Vaping Use   Vaping status: Never Used  Substance Use Topics   Alcohol use: Not Currently    Comment: occasional   Drug use: Yes    Types: Marijuana    Comment: occassionally    ALLERGIES:  Allergies  Allergen Reactions   Celexa [Citalopram] Other (See Comments)    Contraindicated with A-Fib   Ibuprofen Other (See Comments)    Was told to not take this     Metformin And Related Other (See Comments)    Bloody stools    Naproxen Other (See Comments)    Was told to not take this    Yellow Jacket Venom [Bee Venom] Swelling and Other (See Comments)    Severe swelling where stung   Penicillins Hives    Did it involve swelling of the face/tongue/throat, SOB, or low BP? Unk Did it involve sudden or severe rash/hives, skin peeling, or any reaction on the inside of your mouth or nose? Yes Did you need to seek medical attention at a hospital or doctor's office? Unk When did it last happen? "Childhood- 53 years ago" If all above answers are "NO", may proceed with cephalosporin use.     MEDICATIONS:  Current Outpatient Medications  Medication Sig Dispense Refill   diltiazem (CARDIZEM CD) 240 MG 24 hr capsule TAKE 1 CAPSULE BY MOUTH EVERY DAY 90 capsule 0   ELIQUIS 5 MG TABS tablet TAKE 1 TABLET BY MOUTH TWICE A DAY 60 tablet 5   ezetimibe (ZETIA) 10 MG tablet Take 1 tablet by mouth once daily 90 tablet 3   fluticasone (FLONASE) 50 MCG/ACT nasal spray Place 1 spray into both nostrils 2 (two) times daily as needed (sinus congestion). (Patient not taking: Reported on 02/13/2023) 18.2 mL 3   furosemide (LASIX) 40 MG tablet TAKE 1 TABLET BY MOUTH EVERY DAY 90 tablet 0   glimepiride (AMARYL) 1 MG tablet Take 1 mg by mouth daily.     HYDROcodone-acetaminophen (NORCO) 5-325 MG tablet Take 1 tablet by mouth every 6 (six) hours as needed for up to 14 days (For severe cancer related pain). 56 tablet 0   KLOR-CON M20 20 MEQ tablet TAKE 2 TABLETS BY MOUTH EVERY DAY 180 tablet 0   metoprolol succinate (TOPROL-XL) 50 MG 24 hr tablet TAKE 1 TABLET BY MOUTH EVERY DAY WITH OR IMMEDIATELY FOLLOWING A MEAL 90 tablet 0   nitroGLYCERIN (NITROSTAT) 0.4 MG SL tablet Place 1 tablet (0.4 mg total) under the tongue every 5 (five) minutes as needed for chest pain. (Patient not taking: Reported on 11/08/2022) 25 tablet 3   pramoxine-hydrocortisone (PROCTOCREAM-HC) 1-1 % rectal  cream Place 1 application rectally 2 (two) times daily as needed for hemorrhoids or anal itching. (Patient not taking: Reported on 02/13/2023)     No current facility-administered medications for this encounter.   Facility-Administered Medications Ordered in Other Encounters  Medication Dose Route Frequency Provider Last Rate Last Admin   sodium chloride flush (NS) 0.9 % injection 10 mL  10 mL Intravenous PRN Erenest Blank, NP   10 mL at 02/14/21 1054    REVIEW OF SYSTEMS:  A 10+ POINT REVIEW OF SYSTEMS WAS OBTAINED including neurology, dermatology, psychiatry, cardiac, respiratory, lymph, extremities, GI, GU, musculoskeletal, constitutional, reproductive, HEENT. ***  PHYSICAL EXAM:  vitals were not taken for this visit.   General: Alert and oriented, in no acute distress HEENT: Head is normocephalic. Extraocular movements are intact. Oropharynx is clear. Neck: Neck is supple, no palpable cervical or supraclavicular lymphadenopathy. Heart: Regular in rate and rhythm with no murmurs, rubs, or gallops. Chest: Clear to auscultation bilaterally, with no rhonchi, wheezes, or rales. Abdomen: Soft, nontender, nondistended, with no rigidity or guarding. Extremities: No cyanosis or edema. Lymphatics: see Neck Exam Skin: No concerning lesions. Musculoskeletal: symmetric strength and muscle tone throughout. Neurologic: Cranial nerves II through XII are grossly intact. No obvious focalities. Speech is fluent. Coordination is intact. Psychiatric: Judgment and insight are intact. Affect is appropriate. ***  ECOG = ***  0 - Asymptomatic (Fully active, able to carry on all predisease activities without restriction)  1 - Symptomatic but completely ambulatory (Restricted in physically strenuous activity but ambulatory and able to carry out work of a light or sedentary nature. For example, light housework, office work)  2 - Symptomatic, <50% in bed during the day (Ambulatory and capable of all self  care but unable to carry out any work activities. Up and about more than 50% of waking hours)  3 - Symptomatic, >50% in bed, but not bedbound (Capable of only limited self-care, confined to bed or chair 50% or more of waking hours)  4 - Bedbound (Completely disabled. Cannot carry on any self-care. Totally confined to bed or chair)  5 - Death   Santiago Glad MM, Creech RH, Tormey DC, et al. [phone]). "Toxicity and response criteria of the [facility] [employer]". Am. Evlyn Clines. Oncol. 5 (6): 649-55  LABORATORY DATA:  Lab Results  Component Value Date   WBC 10.3 02/13/2023   HGB 13.9 02/13/2023   HCT 40.1 02/13/2023   MCV 86.1 02/13/2023   PLT 240 02/13/2023   NEUTROABS 7.6 02/13/2023   Lab Results  Component Value Date   NA 141 02/13/2023   K 3.3 (L) 02/13/2023   CL 101 02/13/2023   CO2 30 02/13/2023   GLUCOSE 119 (H) 02/13/2023   BUN 22 02/13/2023   CREATININE 1.14 02/13/2023   CALCIUM 9.7 02/13/2023      RADIOGRAPHY: MR THORACIC SPINE W WO CONTRAST  Result Date: 02/20/2023 CLINICAL DATA:  Low back pain with abnormal L5 appearance on PET scan. Metastatic rectal carcinoma. EXAM: MRI THORACIC AND LUMBAR SPINE WITHOUT AND WITH CONTRAST TECHNIQUE: Multiplanar and multiecho pulse sequences of the thoracic and lumbar spine were obtained without and with intravenous contrast. CONTRAST:  9.22mL GADAVIST GADOBUTROL 1 MMOL/ML IV SOLN COMPARISON:  None Available. FINDINGS: MRI THORACIC SPINE FINDINGS Alignment:  Physiologic. Vertebrae: No fracture, evidence of discitis, or suspicious bone lesion. There is a hemangioma at T3. Cord:  Normal signal and morphology. Paraspinal and other soft tissues: Negative. Disc levels: Multilevel mild midthoracic degenerative disc disease greatest at T6-7 and T9-10. No spinal canal stenosis or neural impingement. MRI LUMBAR SPINE FINDINGS Segmentation:  Standard. Alignment:  Physiologic. Vertebrae: Abnormal bone marrow lesion of the L5 vertebral body extending  into the right pedicle. This lesion enhances following contrast infusion. There is a hemangioma at L2. Conus medullaris: Extends to the L1 level and appears normal. Paraspinal and other soft tissues: Negative. Disc levels: L1-L2: Normal disc space and facet joints. No spinal canal stenosis. No neural foraminal stenosis. L2-L3: Small disc bulge with narrowing of both lateral recesses. No central spinal canal stenosis. No neural foraminal stenosis. L3-L4: Small disc bulge with narrowing of the  lateral recesses and moderate facet arthrosis. Moderate spinal canal stenosis. Moderate right neural foraminal stenosis. L4-L5: Left asymmetric disc bulge with narrowing of the left lateral recess. No central spinal canal stenosis. Moderate left neural foraminal stenosis. L5-S1: Intermediate sized disc bulge with endplate spurring narrowing the right-greater-than-left lateral recess. No central spinal canal stenosis. Severe bilateral neural foraminal stenosis. Visualized sacrum: Normal. IMPRESSION: 1. Osseous L5 metastasis without pathologic fracture. 2. No metastatic disease of the thoracic spine. 3. Moderate L3-4 spinal canal stenosis and moderate right neural foraminal stenosis. 4. Severe bilateral L5-S1 neural foraminal stenosis. Electronically Signed   By: Deatra Robinson M.D.   On: 02/20/2023 21:29   MR Lumbar Spine W Wo Contrast  Result Date: 02/20/2023 CLINICAL DATA:  Low back pain with abnormal L5 appearance on PET scan. Metastatic rectal carcinoma. EXAM: MRI THORACIC AND LUMBAR SPINE WITHOUT AND WITH CONTRAST TECHNIQUE: Multiplanar and multiecho pulse sequences of the thoracic and lumbar spine were obtained without and with intravenous contrast. CONTRAST:  9.75mL GADAVIST GADOBUTROL 1 MMOL/ML IV SOLN COMPARISON:  None Available. FINDINGS: MRI THORACIC SPINE FINDINGS Alignment:  Physiologic. Vertebrae: No fracture, evidence of discitis, or suspicious bone lesion. There is a hemangioma at T3. Cord:  Normal signal and  morphology. Paraspinal and other soft tissues: Negative. Disc levels: Multilevel mild midthoracic degenerative disc disease greatest at T6-7 and T9-10. No spinal canal stenosis or neural impingement. MRI LUMBAR SPINE FINDINGS Segmentation:  Standard. Alignment:  Physiologic. Vertebrae: Abnormal bone marrow lesion of the L5 vertebral body extending into the right pedicle. This lesion enhances following contrast infusion. There is a hemangioma at L2. Conus medullaris: Extends to the L1 level and appears normal. Paraspinal and other soft tissues: Negative. Disc levels: L1-L2: Normal disc space and facet joints. No spinal canal stenosis. No neural foraminal stenosis. L2-L3: Small disc bulge with narrowing of both lateral recesses. No central spinal canal stenosis. No neural foraminal stenosis. L3-L4: Small disc bulge with narrowing of the lateral recesses and moderate facet arthrosis. Moderate spinal canal stenosis. Moderate right neural foraminal stenosis. L4-L5: Left asymmetric disc bulge with narrowing of the left lateral recess. No central spinal canal stenosis. Moderate left neural foraminal stenosis. L5-S1: Intermediate sized disc bulge with endplate spurring narrowing the right-greater-than-left lateral recess. No central spinal canal stenosis. Severe bilateral neural foraminal stenosis. Visualized sacrum: Normal. IMPRESSION: 1. Osseous L5 metastasis without pathologic fracture. 2. No metastatic disease of the thoracic spine. 3. Moderate L3-4 spinal canal stenosis and moderate right neural foraminal stenosis. 4. Severe bilateral L5-S1 neural foraminal stenosis. Electronically Signed   By: Deatra Robinson M.D.   On: 02/20/2023 21:29   NM PET Image Restag (PS) Skull Base To Thigh  Result Date: 02/09/2023 CLINICAL DATA:  Subsequent treatment strategy for rectal cancer. Progressive adenopathy on recent CT scan. EXAM: NUCLEAR MEDICINE PET SKULL BASE TO THIGH TECHNIQUE: 11.5 mCi F-18 FDG was injected intravenously.  Full-ring PET imaging was performed from the skull base to thigh after the radiotracer. CT data was obtained and used for attenuation correction and anatomic localization. Fasting blood glucose: 115 mg/dl COMPARISON:  Multiple prior PET CTs. The most recent is 09/20/2021. Most recent CT scan 01/24/2023 FINDINGS: Mediastinal blood pool activity: SUV max 2.27 Liver activity: SUV max NA NECK: No hypermetabolic lymph nodes in the neck. Incidental CT findings: None. CHEST: Hypermetabolism noted in the patient's port and also along the catheter possibly related to a fibrin sheath. Small periaortic/posterior mediastinal lymph nodes are hypermetabolic and consistent with metastatic adenopathy. Small lymph node  between the aorta and esophagus on image 71/4 measures 7 mm and SUV max is 3.56. 13 mm retrocrural node on image 102/4 has an SUV max of 406 No supraclavicular or axillary hypermetabolic adenopathy. No pulmonary nodules to suggest pulmonary metastatic disease. Incidental CT findings: Stable age advanced atherosclerotic calcifications involving the aorta and coronary arteries. ABDOMEN/PELVIS: Retroperitoneal adenopathy is hypermetabolic. 15 mm inter aortocaval node on image 126/4 has an SUV max 5.79. Lower left retroperitoneal lymph node measures 11 mm on image 134/4 and has an SUV max of 6.69. 10 mm left common iliac node on image 144/4 has an SUV max of 12.76 Hypermetabolism along the rectosigmoid junction in the area of the patient's prior neoplasm. SUV max is 5.5 and suspected recurrent tumor. No hypermetabolic mesorectal or sigmoid mesocolon lymph nodes. No inguinal adenopathy. No findings for hepatic metastatic disease. Incidental CT findings: Stable atherosclerotic calcifications involving the aorta and iliac arteries. Stable sigmoid colon diverticulosis. SKELETON: New hypermetabolic bone lesion involving the L5 vertebral body in an area mild sclerosis noted on the CT scan. SUV max is 7.61. No other definite  bone lesions. Incidental CT findings: None. IMPRESSION: 1. Hypermetabolism along the rectosigmoid junction in the area of the patient's prior neoplasm is suspected recurrent tumor. 2. Hypermetabolic lower thoracic, retrocrural and retroperitoneal lymphadenopathy. 3. New hypermetabolic L5 vertebral body lesion consistent with metastatic disease. 4. No findings for hepatic metastatic disease. 5. Hypermetabolism along the patient's port and also along the catheter possibly related to a fibrin sheath. Aortic Atherosclerosis (ICD10-I70.0). Electronically Signed   By: Rudie Meyer M.D.   On: 02/09/2023 10:06      IMPRESSION: Stage T4a, N2 metastatic adenocarcinoma of the rectum diagnosed in 2021 : s/p primary chemotherapy and radiation therapy  - Now with recent PET evidence of recurrent disease along the rectosigmoid junction, along with hypermetabolic lower thoracic, retrocrural and retroperitoneal lymphadenopathy; and new osseous metastatic disease to L5  ***  Today, I talked to the patient and family about the findings and work-up thus far.  We discussed the natural history of *** and general treatment, highlighting the role of radiotherapy in the management.  We discussed the available radiation techniques, and focused on the details of logistics and delivery.  We reviewed the anticipated acute and late sequelae associated with radiation in this setting.  The patient was encouraged to ask questions that I answered to the best of my ability. *** A patient consent form was discussed and signed.  We retained a copy for our records.  The patient would like to proceed with radiation and will be scheduled for CT simulation.  PLAN: ***    *** minutes of total time was spent for this patient encounter, including preparation, face-to-face counseling with the patient and coordination of care, physical exam, and documentation of the encounter.   ------------------------------------------------  Billie Lade,  PhD, MD  This document serves as a record of services personally performed by Antony Blackbird, MD. It was created on his behalf by Neena Rhymes, a trained medical scribe. The creation of this record is based on the scribe's personal observations and the provider's statements to them. This document has been checked and approved by the attending provider.

## 2023-03-04 ENCOUNTER — Ambulatory Visit
Admission: RE | Admit: 2023-03-04 | Discharge: 2023-03-04 | Disposition: A | Discharge status: Home / Self Care | Payer: Medicare HMO | Source: Ambulatory Visit | Attending: Radiation Oncology | Admitting: Radiation Oncology

## 2023-03-04 ENCOUNTER — Encounter: Payer: Self-pay | Admitting: Radiation Oncology

## 2023-03-04 VITALS — BP 148/85 | HR 58 | Temp 96.6°F | Resp 18 | Ht 70.0 in | Wt 212.5 lb

## 2023-03-04 DIAGNOSIS — C20 Malignant neoplasm of rectum: Secondary | ICD-10-CM

## 2023-03-04 DIAGNOSIS — C7951 Secondary malignant neoplasm of bone: Secondary | ICD-10-CM | POA: Diagnosis present

## 2023-03-04 DIAGNOSIS — Z9221 Personal history of antineoplastic chemotherapy: Secondary | ICD-10-CM | POA: Diagnosis not present

## 2023-03-04 DIAGNOSIS — Z7901 Long term (current) use of anticoagulants: Secondary | ICD-10-CM | POA: Diagnosis not present

## 2023-03-04 DIAGNOSIS — Z8042 Family history of malignant neoplasm of prostate: Secondary | ICD-10-CM | POA: Insufficient documentation

## 2023-03-04 DIAGNOSIS — E782 Mixed hyperlipidemia: Secondary | ICD-10-CM | POA: Insufficient documentation

## 2023-03-04 DIAGNOSIS — M48061 Spinal stenosis, lumbar region without neurogenic claudication: Secondary | ICD-10-CM | POA: Insufficient documentation

## 2023-03-04 DIAGNOSIS — M51379 Other intervertebral disc degeneration, lumbosacral region without mention of lumbar back pain or lower extremity pain: Secondary | ICD-10-CM | POA: Diagnosis not present

## 2023-03-04 DIAGNOSIS — I4819 Other persistent atrial fibrillation: Secondary | ICD-10-CM | POA: Insufficient documentation

## 2023-03-04 DIAGNOSIS — Z8673 Personal history of transient ischemic attack (TIA), and cerebral infarction without residual deficits: Secondary | ICD-10-CM | POA: Insufficient documentation

## 2023-03-04 DIAGNOSIS — E1140 Type 2 diabetes mellitus with diabetic neuropathy, unspecified: Secondary | ICD-10-CM | POA: Insufficient documentation

## 2023-03-04 DIAGNOSIS — Z510 Encounter for antineoplastic radiation therapy: Secondary | ICD-10-CM | POA: Diagnosis present

## 2023-03-04 DIAGNOSIS — Z87891 Personal history of nicotine dependence: Secondary | ICD-10-CM | POA: Insufficient documentation

## 2023-03-04 DIAGNOSIS — I10 Essential (primary) hypertension: Secondary | ICD-10-CM | POA: Diagnosis not present

## 2023-03-04 DIAGNOSIS — I252 Old myocardial infarction: Secondary | ICD-10-CM | POA: Diagnosis not present

## 2023-03-04 DIAGNOSIS — R590 Localized enlarged lymph nodes: Secondary | ICD-10-CM | POA: Diagnosis not present

## 2023-03-04 DIAGNOSIS — Z923 Personal history of irradiation: Secondary | ICD-10-CM | POA: Insufficient documentation

## 2023-03-04 DIAGNOSIS — Z7984 Long term (current) use of oral hypoglycemic drugs: Secondary | ICD-10-CM | POA: Diagnosis not present

## 2023-03-04 DIAGNOSIS — I2510 Atherosclerotic heart disease of native coronary artery without angina pectoris: Secondary | ICD-10-CM | POA: Insufficient documentation

## 2023-03-04 DIAGNOSIS — Z79899 Other long term (current) drug therapy: Secondary | ICD-10-CM | POA: Insufficient documentation

## 2023-03-05 ENCOUNTER — Inpatient Hospital Stay: Payer: Medicare HMO

## 2023-03-05 ENCOUNTER — Other Ambulatory Visit: Payer: Self-pay | Admitting: Internal Medicine

## 2023-03-05 DIAGNOSIS — Z510 Encounter for antineoplastic radiation therapy: Secondary | ICD-10-CM | POA: Diagnosis not present

## 2023-03-07 ENCOUNTER — Other Ambulatory Visit: Payer: Self-pay

## 2023-03-07 ENCOUNTER — Inpatient Hospital Stay: Payer: Medicare HMO

## 2023-03-07 ENCOUNTER — Ambulatory Visit
Admission: RE | Admit: 2023-03-07 | Discharge: 2023-03-07 | Disposition: A | Discharge status: Home / Self Care | Payer: Medicare HMO | Source: Ambulatory Visit | Attending: Radiation Oncology | Admitting: Radiation Oncology

## 2023-03-07 ENCOUNTER — Encounter: Payer: Self-pay | Admitting: Hematology & Oncology

## 2023-03-07 DIAGNOSIS — C20 Malignant neoplasm of rectum: Secondary | ICD-10-CM

## 2023-03-07 DIAGNOSIS — Z510 Encounter for antineoplastic radiation therapy: Secondary | ICD-10-CM | POA: Diagnosis not present

## 2023-03-07 LAB — RAD ONC ARIA SESSION SUMMARY
Course Elapsed Days: 0
Plan Fractions Treated to Date: 1
Plan Prescribed Dose Per Fraction: 3 Gy
Plan Total Fractions Prescribed: 10
Plan Total Prescribed Dose: 30 Gy
Reference Point Dosage Given to Date: 3 Gy
Reference Point Session Dosage Given: 3 Gy
Session Number: 1

## 2023-03-08 ENCOUNTER — Ambulatory Visit
Admission: RE | Admit: 2023-03-08 | Discharge: 2023-03-08 | Disposition: A | Discharge status: Home / Self Care | Payer: Medicare HMO | Source: Ambulatory Visit | Attending: Radiation Oncology | Admitting: Radiation Oncology

## 2023-03-08 ENCOUNTER — Other Ambulatory Visit: Payer: Self-pay | Admitting: *Deleted

## 2023-03-08 ENCOUNTER — Other Ambulatory Visit: Payer: Self-pay

## 2023-03-08 DIAGNOSIS — Z510 Encounter for antineoplastic radiation therapy: Secondary | ICD-10-CM | POA: Diagnosis not present

## 2023-03-08 LAB — RAD ONC ARIA SESSION SUMMARY
Course Elapsed Days: 1
Plan Fractions Treated to Date: 2
Plan Prescribed Dose Per Fraction: 3 Gy
Plan Total Fractions Prescribed: 10
Plan Total Prescribed Dose: 30 Gy
Reference Point Dosage Given to Date: 6 Gy
Reference Point Session Dosage Given: 3 Gy
Session Number: 2

## 2023-03-08 MED ORDER — HYDROCODONE-ACETAMINOPHEN 5-325 MG PO TABS: 1.0000 | ORAL_TABLET | Freq: Four times a day (QID) | ORAL | 0 refills | Status: DC | PRN

## 2023-03-11 ENCOUNTER — Other Ambulatory Visit: Payer: Self-pay

## 2023-03-11 ENCOUNTER — Ambulatory Visit
Admission: RE | Admit: 2023-03-11 | Discharge: 2023-03-11 | Disposition: A | Discharge status: Home / Self Care | Payer: Medicare HMO | Source: Ambulatory Visit | Attending: Radiation Oncology | Admitting: Radiation Oncology

## 2023-03-11 DIAGNOSIS — Z510 Encounter for antineoplastic radiation therapy: Secondary | ICD-10-CM | POA: Diagnosis not present

## 2023-03-11 LAB — RAD ONC ARIA SESSION SUMMARY
Course Elapsed Days: 4
Plan Fractions Treated to Date: 3
Plan Prescribed Dose Per Fraction: 3 Gy
Plan Total Fractions Prescribed: 10
Plan Total Prescribed Dose: 30 Gy
Reference Point Dosage Given to Date: 9 Gy
Reference Point Session Dosage Given: 3 Gy
Session Number: 3

## 2023-03-12 ENCOUNTER — Other Ambulatory Visit: Payer: Self-pay

## 2023-03-12 ENCOUNTER — Ambulatory Visit: Payer: Medicare HMO

## 2023-03-12 ENCOUNTER — Ambulatory Visit
Admission: RE | Admit: 2023-03-12 | Discharge: 2023-03-12 | Disposition: A | Discharge status: Home / Self Care | Payer: Medicare HMO | Source: Ambulatory Visit | Attending: Radiation Oncology | Admitting: Radiation Oncology

## 2023-03-12 DIAGNOSIS — Z510 Encounter for antineoplastic radiation therapy: Secondary | ICD-10-CM | POA: Diagnosis not present

## 2023-03-12 LAB — RAD ONC ARIA SESSION SUMMARY
Course Elapsed Days: 5
Plan Fractions Treated to Date: 4
Plan Prescribed Dose Per Fraction: 3 Gy
Plan Total Fractions Prescribed: 10
Plan Total Prescribed Dose: 30 Gy
Reference Point Dosage Given to Date: 12 Gy
Reference Point Session Dosage Given: 3 Gy
Session Number: 4

## 2023-03-13 ENCOUNTER — Ambulatory Visit
Admission: RE | Admit: 2023-03-13 | Discharge: 2023-03-13 | Disposition: A | Discharge status: Home / Self Care | Payer: Medicare HMO | Source: Ambulatory Visit | Attending: Radiation Oncology | Admitting: Radiation Oncology

## 2023-03-13 ENCOUNTER — Other Ambulatory Visit: Payer: Self-pay

## 2023-03-13 DIAGNOSIS — Z510 Encounter for antineoplastic radiation therapy: Secondary | ICD-10-CM | POA: Diagnosis not present

## 2023-03-13 LAB — RAD ONC ARIA SESSION SUMMARY
Course Elapsed Days: 6
Plan Fractions Treated to Date: 5
Plan Prescribed Dose Per Fraction: 3 Gy
Plan Total Fractions Prescribed: 10
Plan Total Prescribed Dose: 30 Gy
Reference Point Dosage Given to Date: 15 Gy
Reference Point Session Dosage Given: 3 Gy
Session Number: 5

## 2023-03-14 ENCOUNTER — Other Ambulatory Visit: Payer: Self-pay

## 2023-03-14 ENCOUNTER — Ambulatory Visit
Admission: RE | Admit: 2023-03-14 | Discharge: 2023-03-14 | Disposition: A | Discharge status: Home / Self Care | Payer: Medicare HMO | Source: Ambulatory Visit | Attending: Radiation Oncology | Admitting: Radiation Oncology

## 2023-03-14 DIAGNOSIS — Z510 Encounter for antineoplastic radiation therapy: Secondary | ICD-10-CM | POA: Diagnosis not present

## 2023-03-14 LAB — RAD ONC ARIA SESSION SUMMARY
Course Elapsed Days: 7
Plan Fractions Treated to Date: 6
Plan Prescribed Dose Per Fraction: 3 Gy
Plan Total Fractions Prescribed: 10
Plan Total Prescribed Dose: 30 Gy
Reference Point Dosage Given to Date: 18 Gy
Reference Point Session Dosage Given: 3 Gy
Session Number: 6

## 2023-03-15 ENCOUNTER — Other Ambulatory Visit: Payer: Self-pay

## 2023-03-15 ENCOUNTER — Ambulatory Visit
Admission: RE | Admit: 2023-03-15 | Discharge: 2023-03-15 | Disposition: A | Discharge status: Home / Self Care | Payer: Medicare HMO | Source: Ambulatory Visit | Attending: Radiation Oncology | Admitting: Radiation Oncology

## 2023-03-15 DIAGNOSIS — Z510 Encounter for antineoplastic radiation therapy: Secondary | ICD-10-CM | POA: Diagnosis not present

## 2023-03-15 LAB — RAD ONC ARIA SESSION SUMMARY
Course Elapsed Days: 8
Plan Fractions Treated to Date: 7
Plan Prescribed Dose Per Fraction: 3 Gy
Plan Total Fractions Prescribed: 10
Plan Total Prescribed Dose: 30 Gy
Reference Point Dosage Given to Date: 21 Gy
Reference Point Session Dosage Given: 3 Gy
Session Number: 7

## 2023-03-18 ENCOUNTER — Ambulatory Visit
Admission: RE | Admit: 2023-03-18 | Discharge: 2023-03-18 | Disposition: A | Discharge status: Home / Self Care | Payer: Medicare HMO | Source: Ambulatory Visit | Attending: Radiation Oncology | Admitting: Radiation Oncology

## 2023-03-18 ENCOUNTER — Other Ambulatory Visit: Payer: Self-pay

## 2023-03-18 DIAGNOSIS — Z510 Encounter for antineoplastic radiation therapy: Secondary | ICD-10-CM | POA: Diagnosis not present

## 2023-03-18 LAB — RAD ONC ARIA SESSION SUMMARY
Course Elapsed Days: 11
Plan Fractions Treated to Date: 8
Plan Prescribed Dose Per Fraction: 3 Gy
Plan Total Fractions Prescribed: 10
Plan Total Prescribed Dose: 30 Gy
Reference Point Dosage Given to Date: 24 Gy
Reference Point Session Dosage Given: 3 Gy
Session Number: 8

## 2023-03-19 ENCOUNTER — Other Ambulatory Visit: Payer: Self-pay

## 2023-03-19 ENCOUNTER — Inpatient Hospital Stay: Payer: Medicare HMO

## 2023-03-19 ENCOUNTER — Inpatient Hospital Stay: Payer: Medicare HMO | Attending: Hematology & Oncology | Admitting: Hematology & Oncology

## 2023-03-19 ENCOUNTER — Encounter: Payer: Self-pay | Admitting: Hematology & Oncology

## 2023-03-19 ENCOUNTER — Ambulatory Visit
Admission: RE | Admit: 2023-03-19 | Discharge: 2023-03-19 | Disposition: A | Discharge status: Home / Self Care | Payer: Medicare HMO | Source: Ambulatory Visit | Attending: Radiation Oncology | Admitting: Radiation Oncology

## 2023-03-19 VITALS — BP 121/78 | HR 83 | Temp 97.9°F | Resp 20 | Wt 212.8 lb

## 2023-03-19 DIAGNOSIS — Z7901 Long term (current) use of anticoagulants: Secondary | ICD-10-CM | POA: Diagnosis not present

## 2023-03-19 DIAGNOSIS — C20 Malignant neoplasm of rectum: Secondary | ICD-10-CM

## 2023-03-19 DIAGNOSIS — Z8673 Personal history of transient ischemic attack (TIA), and cerebral infarction without residual deficits: Secondary | ICD-10-CM | POA: Diagnosis not present

## 2023-03-19 DIAGNOSIS — C7951 Secondary malignant neoplasm of bone: Secondary | ICD-10-CM | POA: Diagnosis not present

## 2023-03-19 DIAGNOSIS — Z5112 Encounter for antineoplastic immunotherapy: Secondary | ICD-10-CM | POA: Diagnosis present

## 2023-03-19 DIAGNOSIS — Z5111 Encounter for antineoplastic chemotherapy: Secondary | ICD-10-CM | POA: Insufficient documentation

## 2023-03-19 DIAGNOSIS — I4891 Unspecified atrial fibrillation: Secondary | ICD-10-CM | POA: Diagnosis not present

## 2023-03-19 DIAGNOSIS — Z7982 Long term (current) use of aspirin: Secondary | ICD-10-CM | POA: Diagnosis not present

## 2023-03-19 DIAGNOSIS — Z510 Encounter for antineoplastic radiation therapy: Secondary | ICD-10-CM | POA: Diagnosis not present

## 2023-03-19 DIAGNOSIS — Z452 Encounter for adjustment and management of vascular access device: Secondary | ICD-10-CM | POA: Insufficient documentation

## 2023-03-19 LAB — CEA (IN HOUSE-CHCC): CEA (CHCC-In House): 4.07 ng/mL (ref 0.00–5.00)

## 2023-03-19 LAB — RAD ONC ARIA SESSION SUMMARY
Course Elapsed Days: 12
Plan Fractions Treated to Date: 9
Plan Prescribed Dose Per Fraction: 3 Gy
Plan Total Fractions Prescribed: 10
Plan Total Prescribed Dose: 30 Gy
Reference Point Dosage Given to Date: 27 Gy
Reference Point Session Dosage Given: 3 Gy
Session Number: 9

## 2023-03-19 LAB — CMP (CANCER CENTER ONLY)
ALT: 10 U/L (ref 0–44)
AST: 12 U/L — ABNORMAL LOW (ref 15–41)
Albumin: 4.6 g/dL (ref 3.5–5.0)
Alkaline Phosphatase: 72 U/L (ref 38–126)
Anion gap: 9 (ref 5–15)
BUN: 21 mg/dL (ref 8–23)
CO2: 29 mmol/L (ref 22–32)
Calcium: 9.5 mg/dL (ref 8.9–10.3)
Chloride: 102 mmol/L (ref 98–111)
Creatinine: 1.11 mg/dL (ref 0.61–1.24)
GFR, Estimated: >60 mL/min (ref >60)
Glucose, Bld: 196 mg/dL — ABNORMAL HIGH (ref 70–99)
Potassium: 3.4 mmol/L — ABNORMAL LOW (ref 3.5–5.1)
Sodium: 140 mmol/L (ref 135–145)
Total Bilirubin: 1.2 mg/dL — ABNORMAL HIGH (ref <1.2)
Total Protein: 6.8 g/dL (ref 6.5–8.1)

## 2023-03-19 LAB — CBC WITH DIFFERENTIAL (CANCER CENTER ONLY)
Abs Immature Granulocytes: 0.04 10*3/uL (ref 0.00–0.07)
Basophils Absolute: 0 10*3/uL (ref 0.0–0.1)
Basophils Relative: 0 %
Eosinophils Absolute: 0.3 10*3/uL (ref 0.0–0.5)
Eosinophils Relative: 3 %
HCT: 37 % — ABNORMAL LOW (ref 39.0–52.0)
Hemoglobin: 12.9 g/dL — ABNORMAL LOW (ref 13.0–17.0)
Immature Granulocytes: 1 %
Lymphocytes Relative: 9 %
Lymphs Abs: 0.7 10*3/uL (ref 0.7–4.0)
MCH: 30.4 pg (ref 26.0–34.0)
MCHC: 34.9 g/dL (ref 30.0–36.0)
MCV: 87.1 fL (ref 80.0–100.0)
Monocytes Absolute: 0.4 10*3/uL (ref 0.1–1.0)
Monocytes Relative: 5 %
Neutro Abs: 6.6 10*3/uL (ref 1.7–7.7)
Neutrophils Relative %: 82 %
Platelet Count: 202 10*3/uL (ref 150–400)
RBC: 4.25 MIL/uL (ref 4.22–5.81)
RDW: 15.7 % — ABNORMAL HIGH (ref 11.5–15.5)
WBC Count: 8 10*3/uL (ref 4.0–10.5)
nRBC: 0 % (ref 0.0–0.2)

## 2023-03-19 LAB — MAGNESIUM: Magnesium: 1.9 mg/dL (ref 1.7–2.4)

## 2023-03-19 MED ORDER — PALONOSETRON HCL INJECTION 0.25 MG/5ML
0.2500 mg | Freq: Once | INTRAVENOUS | Status: AC
Start: 2023-03-19 — End: 2023-03-19
  Administered 2023-03-19: 0.25 mg via INTRAVENOUS
  Filled 2023-03-19: qty 5

## 2023-03-19 MED ORDER — OXALIPLATIN CHEMO INJECTION 100 MG/20ML
85.0000 mg/m2 | Freq: Once | INTRAVENOUS | Status: AC
  Administered 2023-03-19: 200 mg via INTRAVENOUS
  Filled 2023-03-19: qty 40

## 2023-03-19 MED ORDER — SODIUM CHLORIDE 0.9 % IV SOLN: INTRAVENOUS | Status: DC

## 2023-03-19 MED ORDER — LEUCOVORIN CALCIUM INJECTION 350 MG
400.0000 mg/m2 | Freq: Once | INTRAVENOUS | Status: AC
  Administered 2023-03-19: 872 mg via INTRAVENOUS
  Filled 2023-03-19: qty 43.6

## 2023-03-19 MED ORDER — DEXTROSE 5 % IV SOLN: INTRAVENOUS | Status: DC

## 2023-03-19 MED ORDER — ONDANSETRON HCL 8 MG PO TABS: 8.0000 mg | ORAL_TABLET | Freq: Three times a day (TID) | ORAL | 1 refills | Status: DC | PRN

## 2023-03-19 MED ORDER — FLUOROURACIL CHEMO INJECTION 2.5 GM/50ML
400.0000 mg/m2 | Freq: Once | INTRAVENOUS | Status: AC
  Administered 2023-03-19: 850 mg via INTRAVENOUS
  Filled 2023-03-19: qty 17

## 2023-03-19 MED ORDER — FLUOROURACIL CHEMO INJECTION 5 GM/100ML
2400.0000 mg/m2 | INTRAVENOUS | Status: DC
  Administered 2023-03-19: 5000 mg via INTRAVENOUS
  Filled 2023-03-19: qty 100

## 2023-03-19 MED ORDER — SODIUM CHLORIDE 0.9 % IV SOLN
6.0000 mg/kg | Freq: Once | INTRAVENOUS | Status: AC
  Administered 2023-03-19: 600 mg via INTRAVENOUS
  Filled 2023-03-19: qty 20

## 2023-03-19 MED ORDER — PROCHLORPERAZINE MALEATE 10 MG PO TABS: 10.0000 mg | ORAL_TABLET | Freq: Four times a day (QID) | ORAL | 1 refills | Status: DC | PRN

## 2023-03-19 MED ORDER — DEXAMETHASONE SODIUM PHOSPHATE 10 MG/ML IJ SOLN
10.0000 mg | Freq: Once | INTRAMUSCULAR | Status: AC
  Administered 2023-03-19: 10 mg via INTRAVENOUS
  Filled 2023-03-19: qty 1

## 2023-03-19 NOTE — Progress Notes (Signed)
Hematology and Oncology Follow Up Visit  John Douglas [id number] 1955/06/30 67 y.o. 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 twice daily Aspirin 81 mg p.o. daily Zirabev (biosimilar Avastin)/Xeloda maintenance - started 10/12/2020 -- d/c on 11/2020 FOLFOX/Vectibix --start on 03/07/2023 Xgeva 120 mg subcu every 3 months-start 03/07/2023 XRT to L5 -- Completed on 03/19/2023   Interim History:  John Douglas is here today for follow-up.  We can start his chemotherapy today.  Hopefully, he will do okay with chemotherapy.  We are going to do FOLFOX with Vectibix.  He has 1 more radiation treatment for the L5 vertebral metastasis.  He has tolerated this pretty well.  He continues on the Eliquis for the atrial fibrillation.  He has had no problems with nausea or vomiting.  He has had no cough or shortness of breath.  There is been no diarrhea.  Of note, the last on that we saw him, his CEA level was 2.65.  This really has never been elevated.  He has had no leg swelling.  He has had no neuropathy.  Overall, I would say that his performance status is probably ECOG 1.   Medications:  Allergies as of 03/19/2023       Reactions   Celexa [citalopram] Other (See Comments)   Contraindicated with A-Fib   Ibuprofen Other (See Comments)   Was told to not take this    Metformin And Related Other (See Comments)   Bloody stools    Naproxen Other (See Comments)   Was told to not take this    Yellow Jacket Venom [bee Venom] Swelling, Other (See Comments)   Severe swelling where stung   Penicillins Hives   Did it involve  swelling of the face/tongue/throat, SOB, or low BP? Unk Did it involve sudden or severe rash/hives, skin peeling, or any reaction on the inside of your mouth or nose? Yes Did you need to seek medical attention at a hospital or doctor's office? Unk When did it last happen? "Childhood- 55 years ago" If all above answers are "NO", may proceed with cephalosporin use.        Medication List        Accurate as of March 19, 2023 11:17 AM. If you have any questions, ask your nurse or doctor.          diltiazem 240 MG 24 hr capsule Commonly known as: CARDIZEM CD TAKE 1 CAPSULE BY MOUTH EVERY DAY   Eliquis 5 MG Tabs tablet Generic drug: apixaban TAKE 1 TABLET BY MOUTH TWICE A DAY   ezetimibe 10 MG tablet Commonly known as: ZETIA Take 1 tablet by mouth once daily   fluticasone 50 MCG/ACT nasal spray Commonly known as: Flonase Place 1 spray into both nostrils 2 (two) times daily as needed (sinus congestion).   furosemide 40 MG tablet Commonly known as: LASIX TAKE 1 TABLET BY MOUTH EVERY DAY   glimepiride 1 MG tablet Commonly known as: AMARYL Take 1 mg by mouth daily.   HYDROcodone-acetaminophen 5-325 MG tablet Commonly known as: Norco Take 1 tablet by mouth every 6 (six) hours as needed for moderate pain (pain score 4-6).   Klor-Con M20 20 MEQ tablet  Generic drug: potassium chloride SA TAKE 2 TABLETS BY MOUTH EVERY DAY   metoprolol succinate 50 MG 24 hr tablet Commonly known as: TOPROL-XL Take 1 tablet (50 mg total) by mouth daily.   nitroGLYCERIN 0.4 MG SL tablet Commonly known as: NITROSTAT Place 1 tablet (0.4 mg total) under the tongue every 5 (five) minutes as needed for chest pain.   ondansetron 8 MG tablet Commonly known as: Zofran Take 1 tablet (8 mg total) by mouth every 8 (eight) hours as needed for nausea or vomiting. Start on the third day after chemotherapy.   pramoxine-hydrocortisone 1-1 % rectal cream Commonly known as: PROCTOCREAM-HC Place 1  application rectally 2 (two) times daily as needed for hemorrhoids or anal itching.   prochlorperazine 10 MG tablet Commonly known as: COMPAZINE Take 1 tablet (10 mg total) by mouth every 6 (six) hours as needed for nausea or vomiting.        Allergies:  Allergies  Allergen Reactions   Celexa [Citalopram] Other (See Comments)    Contraindicated with A-Fib   Ibuprofen Other (See Comments)    Was told to not take this    Metformin And Related Other (See Comments)    Bloody stools    Naproxen Other (See Comments)    Was told to not take this    Yellow Jacket Venom [Bee Venom] Swelling and Other (See Comments)    Severe swelling where stung   Penicillins Hives    Did it involve swelling of the face/tongue/throat, SOB, or low BP? Unk Did it involve sudden or severe rash/hives, skin peeling, or any reaction on the inside of your mouth or nose? Yes Did you need to seek medical attention at a hospital or doctor's office? Unk When did it last happen? "Childhood- 55 years ago" If all above answers are "NO", may proceed with cephalosporin use.     Past Medical History, Surgical history, Social history, and Family History were reviewed and updated.  Review of Systems: . Review of Systems  Constitutional: Negative.   HENT: Negative.    Eyes: Negative.   Respiratory: Negative.    Cardiovascular: Negative.   Gastrointestinal: Negative.   Genitourinary: Negative.   Musculoskeletal:  Positive for joint pain.  Skin: Negative.   Neurological: Negative.   Endo/Heme/Allergies: Negative.   Psychiatric/Behavioral: Negative.       Physical Exam:  Vital signs show temperature of 97.9.  Pulse 83.  Blood pressure 121/78.  Weight is 212 pounds.   Wt Readings from Last 3 Encounters:  03/19/23 212 lb 12.8 oz (96.5 kg)  03/04/23 212 lb 8 oz (96.4 kg)  02/13/23 213 lb (96.6 kg)    Physical Exam Vitals reviewed.  HENT:     Head: Normocephalic and atraumatic.  Eyes:     Pupils: Pupils  are equal, round, and reactive to light.  Cardiovascular:     Rate and Rhythm: Normal rate and regular rhythm.     Heart sounds: Normal heart sounds.  Pulmonary:     Effort: Pulmonary effort is normal.     Breath sounds: Normal breath sounds.  Abdominal:     General: Bowel sounds are normal.     Palpations: Abdomen is soft.  Musculoskeletal:        General: No tenderness or deformity. Normal range of motion.     Cervical back: Normal range of motion.     Comments: He does have some swelling in the fingers of his right hand.  The main finger is his second  finger.  This is quite swollen.  He does have some limited range of motion of his fingers.  It is hard for him to make a fist.  Lymphadenopathy:     Cervical: No cervical adenopathy.  Skin:    General: Skin is warm and dry.     Findings: No erythema or rash.  Neurological:     Mental Status: He is alert and oriented to person, place, and time.  Psychiatric:        Behavior: Behavior normal.        Thought Content: Thought content normal.        Judgment: Judgment normal.     Lab Results  Component Value Date   WBC 8.0 03/19/2023   HGB 12.9 (L) 03/19/2023   HCT 37.0 (L) 03/19/2023   MCV 87.1 03/19/2023   PLT 202 03/19/2023   Lab Results  Component Value Date   FERRITIN 374 (H) 02/14/2021   IRON 125 02/14/2021   TIBC 329 02/14/2021   UIBC 204 02/14/2021   IRONPCTSAT 38 02/14/2021   Lab Results  Component Value Date   RETICCTPCT 5.4 (H) 08/15/2020   RBC 4.25 03/19/2023   No results found for: "KPAFRELGTCHN", "LAMBDASER", "KAPLAMBRATIO" No results found for: "IGGSERUM", "IGA", "IGMSERUM" No results found for: "TOTALPROTELP", "ALBUMINELP", "A1GS", "A2GS", "BETS", "BETA2SER", "GAMS", "MSPIKE", "SPEI"   Chemistry      Component Value Date/Time   NA 140 03/19/2023 0950   NA 140 08/28/2019 0940   K 3.4 (L) 03/19/2023 0950   CL 102 03/19/2023 0950   CO2 29 03/19/2023 0950   BUN 21 03/19/2023 0950   BUN 21 08/28/2019  0940   CREATININE 1.11 03/19/2023 0950      Component Value Date/Time   CALCIUM 9.5 03/19/2023 0950   ALKPHOS 72 03/19/2023 0950   AST 12 (L) 03/19/2023 0950   ALT 10 03/19/2023 0950   BILITOT 1.2 (H) 03/19/2023 0950       Impression and Plan: [name] is a pleasant 67 yo caucasian gentleman with  history of CVA secondary to atrial fib and rectal bleeding. He was diagnosed with metastatic adenocarcinoma of the rectum -K-ras wild type/BRAF wild-type/HER-2 negative/MMR proficient. He had adenopathy distant to the rectum.   He now has disease recurrence.  He is currently getting radiation therapy.  He will finish this up tomorrow.  Will start him on systemic chemotherapy.  We will give him 4 cycles and then repeat his PET scan.  He had a very nice response the last time that we treated him.  Maybe, with the Vectibix added to the FOLFOX, we will still get a very good response.  I would like to see him back in another couple weeks for his second cycle of treatment.  Josph Macho, MD 11/19/202411:17 AM

## 2023-03-19 NOTE — Patient Instructions (Signed)

## 2023-03-19 NOTE — Patient Instructions (Signed)
[facility] - A DEPT OF [facility]  Discharge Instructions: Thank you for choosing [facility] to provide your oncology and hematology care.   If you have a lab appointment with the Cancer Center, please go directly to the Cancer Center and check in at the registration area.  Wear comfortable clothing and clothing appropriate for easy access to any Portacath or PICC line.   We strive to give you quality time with your provider. You may need to reschedule your appointment if you arrive late (15 or more minutes).  Arriving late affects you and other patients whose appointments are after yours.  Also, if you miss three or more appointments without notifying the office, you may be dismissed from the clinic at the provider's discretion.      For prescription refill requests, have your pharmacy contact our office and allow 72 hours for refills to be completed.    Today you received the following chemotherapy and/or immunotherapy agents FOLFOX, Vectibix      To help prevent nausea and vomiting after your treatment, we encourage you to take your nausea medication as directed.  BELOW ARE SYMPTOMS THAT SHOULD BE REPORTED IMMEDIATELY: *FEVER GREATER THAN 100.4 F (38 C) OR HIGHER *CHILLS OR SWEATING *NAUSEA AND VOMITING THAT IS NOT CONTROLLED WITH YOUR NAUSEA MEDICATION *UNUSUAL SHORTNESS OF BREATH *UNUSUAL BRUISING OR BLEEDING *URINARY PROBLEMS (pain or burning when urinating, or frequent urination) *BOWEL PROBLEMS (unusual diarrhea, constipation, pain near the anus) TENDERNESS IN MOUTH AND THROAT WITH OR WITHOUT PRESENCE OF ULCERS (sore throat, sores in mouth, or a toothache) UNUSUAL RASH, SWELLING OR PAIN  UNUSUAL VAGINAL DISCHARGE OR ITCHING   Items with * indicate a potential emergency and should be followed up as soon as possible or go to the Emergency Department if any problems should occur.  Please show the CHEMOTHERAPY ALERT CARD or  IMMUNOTHERAPY ALERT CARD at check-in to the Emergency Department and triage nurse. Should you have questions after your visit or need to cancel or reschedule your appointment, please contact [facility] - A DEPT OF [facility]  [phone] and follow the prompts.  Office hours are 8:00 a.m. to 4:30 p.m. Monday - Friday. Please note that voicemails left after 4:00 p.m. may not be returned until the following business day.  We are closed weekends and major holidays. You have access to a nurse at all times for urgent questions. Please call the main number to the clinic [phone] and follow the prompts.  For any non-urgent questions, you may also contact your provider using MyChart. We now offer e-Visits for anyone 78 and older to request care online for non-urgent symptoms. For details visit [email or web address].   Also download the MyChart app! Go to the app store, search "MyChart", open the app, select [facility], and log in with your MyChart username and password.

## 2023-03-20 ENCOUNTER — Ambulatory Visit
Admission: RE | Admit: 2023-03-20 | Discharge: 2023-03-20 | Disposition: A | Discharge status: Home / Self Care | Payer: Medicare HMO | Source: Ambulatory Visit | Attending: Radiation Oncology | Admitting: Radiation Oncology

## 2023-03-20 ENCOUNTER — Other Ambulatory Visit: Payer: Self-pay

## 2023-03-20 DIAGNOSIS — Z510 Encounter for antineoplastic radiation therapy: Secondary | ICD-10-CM | POA: Diagnosis not present

## 2023-03-20 LAB — RAD ONC ARIA SESSION SUMMARY
Course Elapsed Days: 13
Plan Fractions Treated to Date: 10
Plan Prescribed Dose Per Fraction: 3 Gy
Plan Total Fractions Prescribed: 10
Plan Total Prescribed Dose: 30 Gy
Reference Point Dosage Given to Date: 30 Gy
Reference Point Session Dosage Given: 3 Gy
Session Number: 10

## 2023-03-21 ENCOUNTER — Inpatient Hospital Stay: Payer: Medicare HMO

## 2023-03-21 VITALS — BP 141/96 | HR 86 | Temp 98.5°F | Resp 18

## 2023-03-21 DIAGNOSIS — C20 Malignant neoplasm of rectum: Secondary | ICD-10-CM

## 2023-03-21 DIAGNOSIS — Z5112 Encounter for antineoplastic immunotherapy: Secondary | ICD-10-CM | POA: Diagnosis not present

## 2023-03-21 MED ORDER — SODIUM CHLORIDE 0.9% FLUSH
10.0000 mL | INTRAVENOUS | Status: DC | PRN
  Administered 2023-03-21: 10 mL

## 2023-03-21 MED ORDER — HEPARIN SOD (PORK) LOCK FLUSH 100 UNIT/ML IV SOLN
500.0000 [IU] | Freq: Once | INTRAVENOUS | Status: AC | PRN
  Administered 2023-03-21: 500 [IU]

## 2023-03-21 NOTE — Radiation Completion Notes (Signed)
Patient Name: John Douglas, John Douglas MRN: [id number] Date of Birth: [birth year] Referring Physician: Burnell Blanks, M.D. Date of Service: 2023-03-21 Radiation Oncologist: Arnette Schaumann, M.D. [facility]                             RADIATION ONCOLOGY END OF TREATMENT NOTE     Diagnosis: C20 Malignant neoplasm of rectum Intent: Palliative     ==========DELIVERED PLANS==========  First Treatment Date: 2023-03-07 - Last Treatment Date: 2023-03-20   Plan Name: Spine_L5 Site: Lumbar Spine Technique: 3D Mode: Photon Dose Per Fraction: 3 Gy Prescribed Dose (Delivered / Prescribed): 30 Gy / 30 Gy Prescribed Fxs (Delivered / Prescribed): 10 / 10     ==========ON TREATMENT VISIT DATES========== 2023-03-12, 2023-03-20     ==========UPCOMING VISITS==========       ==========APPENDIX - ON TREATMENT VISIT NOTES==========   See weekly On Treatment Notes in Epic for details.

## 2023-03-21 NOTE — Patient Instructions (Signed)

## 2023-03-27 ENCOUNTER — Encounter: Payer: Self-pay | Admitting: Hematology & Oncology

## 2023-04-02 ENCOUNTER — Inpatient Hospital Stay: Payer: Medicare HMO

## 2023-04-02 ENCOUNTER — Inpatient Hospital Stay (HOSPITAL_BASED_OUTPATIENT_CLINIC_OR_DEPARTMENT_OTHER): Payer: Medicare HMO

## 2023-04-02 ENCOUNTER — Inpatient Hospital Stay: Payer: Medicare HMO | Attending: Hematology & Oncology

## 2023-04-02 ENCOUNTER — Encounter: Payer: Self-pay | Admitting: Medical Oncology

## 2023-04-02 ENCOUNTER — Other Ambulatory Visit: Payer: Self-pay | Admitting: Medical Oncology

## 2023-04-02 ENCOUNTER — Inpatient Hospital Stay (HOSPITAL_BASED_OUTPATIENT_CLINIC_OR_DEPARTMENT_OTHER): Payer: Medicare HMO | Admitting: Medical Oncology

## 2023-04-02 VITALS — BP 137/79 | HR 86 | Temp 98.3°F | Resp 18 | Ht 70.0 in | Wt 213.0 lb

## 2023-04-02 DIAGNOSIS — D6959 Other secondary thrombocytopenia: Secondary | ICD-10-CM | POA: Diagnosis not present

## 2023-04-02 DIAGNOSIS — D500 Iron deficiency anemia secondary to blood loss (chronic): Secondary | ICD-10-CM | POA: Diagnosis not present

## 2023-04-02 DIAGNOSIS — Z8673 Personal history of transient ischemic attack (TIA), and cerebral infarction without residual deficits: Secondary | ICD-10-CM | POA: Diagnosis not present

## 2023-04-02 DIAGNOSIS — I4891 Unspecified atrial fibrillation: Secondary | ICD-10-CM | POA: Insufficient documentation

## 2023-04-02 DIAGNOSIS — D701 Agranulocytosis secondary to cancer chemotherapy: Secondary | ICD-10-CM | POA: Diagnosis not present

## 2023-04-02 DIAGNOSIS — Z7901 Long term (current) use of anticoagulants: Secondary | ICD-10-CM | POA: Diagnosis not present

## 2023-04-02 DIAGNOSIS — T451X5A Adverse effect of antineoplastic and immunosuppressive drugs, initial encounter: Secondary | ICD-10-CM

## 2023-04-02 DIAGNOSIS — D6481 Anemia due to antineoplastic chemotherapy: Secondary | ICD-10-CM | POA: Diagnosis not present

## 2023-04-02 DIAGNOSIS — Z5111 Encounter for antineoplastic chemotherapy: Secondary | ICD-10-CM | POA: Insufficient documentation

## 2023-04-02 DIAGNOSIS — K521 Toxic gastroenteritis and colitis: Secondary | ICD-10-CM

## 2023-04-02 DIAGNOSIS — C20 Malignant neoplasm of rectum: Secondary | ICD-10-CM

## 2023-04-02 DIAGNOSIS — Z7982 Long term (current) use of aspirin: Secondary | ICD-10-CM | POA: Diagnosis not present

## 2023-04-02 DIAGNOSIS — Z5112 Encounter for antineoplastic immunotherapy: Secondary | ICD-10-CM | POA: Diagnosis present

## 2023-04-02 DIAGNOSIS — Z452 Encounter for adjustment and management of vascular access device: Secondary | ICD-10-CM | POA: Insufficient documentation

## 2023-04-02 LAB — CMP (CANCER CENTER ONLY)
ALT: 12 U/L (ref 0–44)
AST: 10 U/L — ABNORMAL LOW (ref 15–41)
Albumin: 3.5 g/dL (ref 3.5–5.0)
Alkaline Phosphatase: 49 U/L (ref 38–126)
Anion gap: 6 (ref 5–15)
BUN: 12 mg/dL (ref 8–23)
CO2: 28 mmol/L (ref 22–32)
Calcium: 8.6 mg/dL — ABNORMAL LOW (ref 8.9–10.3)
Chloride: 106 mmol/L (ref 98–111)
Creatinine: 0.84 mg/dL (ref 0.61–1.24)
GFR, Estimated: >60 mL/min (ref >60)
Glucose, Bld: 158 mg/dL — ABNORMAL HIGH (ref 70–99)
Potassium: 3.4 mmol/L — ABNORMAL LOW (ref 3.5–5.1)
Sodium: 140 mmol/L (ref 135–145)
Total Bilirubin: 1.1 mg/dL (ref <1.2)
Total Protein: 5.6 g/dL — ABNORMAL LOW (ref 6.5–8.1)

## 2023-04-02 LAB — CBC WITH DIFFERENTIAL (CANCER CENTER ONLY)
Abs Immature Granulocytes: 0.02 10*3/uL (ref 0.00–0.07)
Basophils Absolute: 0 10*3/uL (ref 0.0–0.1)
Basophils Relative: 1 %
Eosinophils Absolute: 0.2 10*3/uL (ref 0.0–0.5)
Eosinophils Relative: 5 %
HCT: 33.3 % — ABNORMAL LOW (ref 39.0–52.0)
Hemoglobin: 11.6 g/dL — ABNORMAL LOW (ref 13.0–17.0)
Immature Granulocytes: 1 %
Lymphocytes Relative: 15 %
Lymphs Abs: 0.6 10*3/uL — ABNORMAL LOW (ref 0.7–4.0)
MCH: 31.1 pg (ref 26.0–34.0)
MCHC: 34.8 g/dL (ref 30.0–36.0)
MCV: 89.3 fL (ref 80.0–100.0)
Monocytes Absolute: 0.5 10*3/uL (ref 0.1–1.0)
Monocytes Relative: 12 %
Neutro Abs: 2.7 10*3/uL (ref 1.7–7.7)
Neutrophils Relative %: 66 %
Platelet Count: 132 10*3/uL — ABNORMAL LOW (ref 150–400)
RBC: 3.73 MIL/uL — ABNORMAL LOW (ref 4.22–5.81)
RDW: 18.2 % — ABNORMAL HIGH (ref 11.5–15.5)
WBC Count: 3.9 10*3/uL — ABNORMAL LOW (ref 4.0–10.5)
nRBC: 0.5 % — ABNORMAL HIGH (ref 0.0–0.2)

## 2023-04-02 LAB — MAGNESIUM: Magnesium: 1.9 mg/dL (ref 1.7–2.4)

## 2023-04-02 MED ORDER — PALONOSETRON HCL INJECTION 0.25 MG/5ML
0.2500 mg | Freq: Once | INTRAVENOUS | Status: AC
  Administered 2023-04-02: 0.25 mg via INTRAVENOUS
  Filled 2023-04-02: qty 5

## 2023-04-02 MED ORDER — SODIUM CHLORIDE 0.9 % IV SOLN
2400.0000 mg/m2 | INTRAVENOUS | Status: DC
  Administered 2023-04-02: 5000 mg via INTRAVENOUS
  Filled 2023-04-02: qty 100

## 2023-04-02 MED ORDER — LEUCOVORIN CALCIUM INJECTION 350 MG
400.0000 mg/m2 | Freq: Once | INTRAVENOUS | Status: DC
  Filled 2023-04-02: qty 43.6

## 2023-04-02 MED ORDER — OXALIPLATIN CHEMO INJECTION 100 MG/20ML
75.0000 mg/m2 | Freq: Once | INTRAVENOUS | Status: AC
  Administered 2023-04-02: 150 mg via INTRAVENOUS
  Filled 2023-04-02: qty 20

## 2023-04-02 MED ORDER — DEXAMETHASONE SODIUM PHOSPHATE 10 MG/ML IJ SOLN
10.0000 mg | Freq: Once | INTRAMUSCULAR | Status: AC
  Administered 2023-04-02: 10 mg via INTRAVENOUS
  Filled 2023-04-02: qty 1

## 2023-04-02 MED ORDER — SODIUM CHLORIDE 0.9 % IV SOLN
6.0000 mg/kg | Freq: Once | INTRAVENOUS | Status: AC
  Administered 2023-04-02: 600 mg via INTRAVENOUS
  Filled 2023-04-02: qty 20

## 2023-04-02 MED ORDER — OXALIPLATIN CHEMO INJECTION 100 MG/20ML: 85.0000 mg/m2 | Freq: Once | INTRAVENOUS | Status: DC

## 2023-04-02 MED ORDER — SODIUM CHLORIDE 0.9 % IV SOLN
INTRAVENOUS | Status: DC
Start: 2023-04-02 — End: 2023-04-02

## 2023-04-02 MED ORDER — FLUOROURACIL CHEMO INJECTION 2.5 GM/50ML
400.0000 mg/m2 | Freq: Once | INTRAVENOUS | Status: DC
Start: 2023-04-02 — End: 2023-04-02

## 2023-04-02 MED ORDER — DEXTROSE 5 % IV SOLN: INTRAVENOUS | Status: DC

## 2023-04-02 MED ORDER — LEUCOVORIN CALCIUM INJECTION 350 MG
400.0000 mg/m2 | Freq: Once | INTRAVENOUS | Status: AC
  Administered 2023-04-02: 872 mg via INTRAVENOUS
  Filled 2023-04-02: qty 43.6

## 2023-04-02 NOTE — Progress Notes (Signed)
thrombocytopenia; hold 5FU bolus per Clent Jacks, PA.  Keep 5FU pump dose as ordered.  Ebony Hail, Pharm.D., CPP 04/02/2023@1 :30 PM

## 2023-04-02 NOTE — Progress Notes (Signed)
Hematology and Oncology Follow Up Visit  John Douglas [id number] 1955/08/19 67 y.o. 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 twice daily Aspirin 81 mg p.o. daily FOLFOX/Vectibix --start on 03/07/2023; Dose adjusted for cycle 2 Xgeva 120 mg subcu every 3 months-start 03/07/2023   Interim History:  John Douglas is here today for follow-up and consideration of FOLFOX with Vectibix Cycle 2.   John Douglas reports that John Douglas tolerated Cycle 1 with some mild neuropathy and fairly extensive loose stools. No fevers, bloody movements, nausea. This has improved a bit over the last few days. His plan is to try Mylanta or Tums as suggested by Dr. Myna Hidalgo.   John Douglas continues on the Eliquis for his atrial fibrillation.  John Douglas has had no problems with nausea or vomiting.  John Douglas has had no cough or shortness of breath.    Of note, the last on that we saw him, his CEA level was 4.07- this is up a bit from 2.65 1 month prior but has never really been elevated.   John Douglas has had no leg swelling.  John Douglas has had no neuropathy.  Overall, I would say that his performance status is probably ECOG 1. Wt Readings from Last 3 Encounters:  04/02/23 213 lb (96.6 kg)  03/19/23 212 lb 12.8 oz (96.5 kg)  03/04/23 212 lb 8 oz (96.4 kg)    Medications:  Allergies as of 04/02/2023       Reactions   Celexa [citalopram] Other (See Comments)   Contraindicated with A-Fib   Ibuprofen Other (See Comments)   Was told to not take this    Metformin And Related  Other (See Comments)   Bloody stools    Naproxen Other (See Comments)   Was told to not take this    Yellow Jacket Venom [bee Venom] Swelling, Other (See Comments)   Severe swelling where stung   Penicillins Hives   Did it involve swelling of the face/tongue/throat, SOB, or low BP? Unk Did it involve sudden or severe rash/hives, skin peeling, or any reaction on the inside of your mouth or nose? Yes Did you need to seek medical attention at a hospital or doctor's office? Unk When did it last happen? "Childhood- 55 years ago" If all above answers are "NO", may proceed with cephalosporin use.        Medication List        Accurate as of April 02, 2023  3:15 PM. If you have any questions, ask your nurse or doctor.          diltiazem 240 MG 24 hr capsule Commonly known as: CARDIZEM CD TAKE 1 CAPSULE BY MOUTH EVERY DAY   Eliquis 5 MG Tabs tablet Generic drug: apixaban TAKE 1 TABLET BY MOUTH TWICE A DAY   ezetimibe 10 MG tablet Commonly known as: ZETIA Take 1 tablet by mouth once daily   fluticasone 50 MCG/ACT nasal spray Commonly known as: Flonase Place 1 spray into both nostrils 2 (two) times daily as needed (sinus congestion).   furosemide 40 MG tablet Commonly known as: LASIX TAKE 1 TABLET BY  MOUTH EVERY DAY   glimepiride 1 MG tablet Commonly known as: AMARYL Take 1 mg by mouth daily.   HYDROcodone-acetaminophen 5-325 MG tablet Commonly known as: Norco Take 1 tablet by mouth every 6 (six) hours as needed for moderate pain (pain score 4-6).   Klor-Con M20 20 MEQ tablet Generic drug: potassium chloride SA TAKE 2 TABLETS BY MOUTH EVERY DAY   metoprolol succinate 50 MG 24 hr tablet Commonly known as: TOPROL-XL Take 1 tablet (50 mg total) by mouth daily.   nitroGLYCERIN 0.4 MG SL tablet Commonly known as: NITROSTAT Place 1 tablet (0.4 mg total) under the tongue every 5 (five) minutes as needed for chest pain.   ondansetron 8 MG tablet Commonly known as:  Zofran Take 1 tablet (8 mg total) by mouth every 8 (eight) hours as needed for nausea or vomiting. Start on the third day after chemotherapy.   pramoxine-hydrocortisone 1-1 % rectal cream Commonly known as: PROCTOCREAM-HC Place 1 application rectally 2 (two) times daily as needed for hemorrhoids or anal itching.   prochlorperazine 10 MG tablet Commonly known as: COMPAZINE Take 1 tablet (10 mg total) by mouth every 6 (six) hours as needed for nausea or vomiting.        Allergies:  Allergies  Allergen Reactions   Celexa [Citalopram] Other (See Comments)    Contraindicated with A-Fib   Ibuprofen Other (See Comments)    Was told to not take this    Metformin And Related Other (See Comments)    Bloody stools    Naproxen Other (See Comments)    Was told to not take this    Yellow Jacket Venom [Bee Venom] Swelling and Other (See Comments)    Severe swelling where stung   Penicillins Hives    Did it involve swelling of the face/tongue/throat, SOB, or low BP? Unk Did it involve sudden or severe rash/hives, skin peeling, or any reaction on the inside of your mouth or nose? Yes Did you need to seek medical attention at a hospital or doctor's office? Unk When did it last happen? "Childhood- 55 years ago" If all above answers are "NO", may proceed with cephalosporin use.     Past Medical History, Surgical history, Social history, and Family History were reviewed and updated.  Review of Systems:  Review of Systems  Constitutional: Negative.   HENT: Negative.    Eyes: Negative.   Respiratory: Negative.    Cardiovascular: Negative.   Gastrointestinal:  Positive for diarrhea.  Genitourinary: Negative.   Musculoskeletal:  Positive for joint pain.  Skin: Negative.   Neurological: Negative.   Endo/Heme/Allergies: Negative.   Psychiatric/Behavioral: Negative.       Physical Exam:  Vitals:   04/02/23 1218  BP: 137/79  Pulse: 86  Resp: 18  Temp: 98.3 F (36.8 C)  SpO2: 98%     Wt Readings from Last 3 Encounters:  04/02/23 213 lb (96.6 kg)  03/19/23 212 lb 12.8 oz (96.5 kg)  03/04/23 212 lb 8 oz (96.4 kg)    Physical Exam Vitals reviewed.  HENT:     Head: Normocephalic and atraumatic.  Eyes:     Pupils: Pupils are equal, round, and reactive to light.  Cardiovascular:     Rate and Rhythm: Normal rate and regular rhythm.     Heart sounds: Normal heart sounds.  Pulmonary:     Effort: Pulmonary effort is normal.     Breath sounds: Normal breath sounds.  Abdominal:     General: Bowel sounds are  normal.     Palpations: Abdomen is soft.  Musculoskeletal:        General: No tenderness or deformity. Normal range of motion.     Cervical back: Normal range of motion.  Lymphadenopathy:     Cervical: No cervical adenopathy.  Skin:    General: Skin is warm and dry.     Findings: No erythema or rash.  Neurological:     Mental Status: John Douglas is alert and oriented to person, place, and time.  Psychiatric:        Behavior: Behavior normal.        Thought Content: Thought content normal.        Judgment: Judgment normal.     Lab Results  Component Value Date   WBC 3.9 (L) 04/02/2023   HGB 11.6 (L) 04/02/2023   HCT 33.3 (L) 04/02/2023   MCV 89.3 04/02/2023   PLT 132 (L) 04/02/2023   Lab Results  Component Value Date   FERRITIN 374 (H) 02/14/2021   IRON 125 02/14/2021   TIBC 329 02/14/2021   UIBC 204 02/14/2021   IRONPCTSAT 38 02/14/2021   Lab Results  Component Value Date   RETICCTPCT 5.4 (H) 08/15/2020   RBC 3.73 (L) 04/02/2023   No results found for: "KPAFRELGTCHN", "LAMBDASER", "KAPLAMBRATIO" No results found for: "IGGSERUM", "IGA", "IGMSERUM" No results found for: "TOTALPROTELP", "ALBUMINELP", "A1GS", "A2GS", "BETS", "BETA2SER", "GAMS", "MSPIKE", "SPEI"   Chemistry      Component Value Date/Time   NA 140 04/02/2023 0950   NA 140 08/28/2019 0940   K 3.4 (L) 04/02/2023 0950   CL 106 04/02/2023 0950   CO2 28 04/02/2023 0950   BUN 12  04/02/2023 0950   BUN 21 08/28/2019 0940   CREATININE 0.84 04/02/2023 0950      Component Value Date/Time   CALCIUM 8.6 (L) 04/02/2023 0950   ALKPHOS 49 04/02/2023 0950   AST 10 (L) 04/02/2023 0950   ALT 12 04/02/2023 0950   BILITOT 1.1 04/02/2023 0950     Encounter Diagnoses  Name Primary?   Rectal cancer (HCC) Yes   Iron deficiency anemia due to chronic blood loss    Chemotherapy induced diarrhea    Leukopenia due to antineoplastic chemotherapy (HCC)    Anemia due to antineoplastic chemotherapy    Chemotherapy-induced thrombocytopenia     Impression and Plan: John Douglas is a pleasant 67 yo caucasian gentleman with history of CVA secondary to atrial fib and rectal bleeding. John Douglas was diagnosed with metastatic adenocarcinoma of the rectum -K-ras wild type/BRAF wild-type/HER-2 negative/MMR proficient. John Douglas had adenopathy distant to the rectum. John Douglas now has disease recurrence.  John Douglas has finished radiation therapy.   John Douglas is now on systemic chemotherapy with plan for 4 cycles prior to repeat PET imaging.   After review of his CBC today it appears that this treatment regimen had some fairly substantial effects on his blood counts. John Douglas also had some fairly significant loose stools which has improved. Little concern for infectious cause given history however I have suggested a dose change for this cycle. Plan discussed and agreeable by Dr. Myna Hidalgo and patient. I would like for him to be seen in 2 weeks for APP and labs to ensure counts/symptoms are still stable.   Dose adjusted treatment today RTC 2 weeks MD, labs (CBC w/, CMP, iron, ferritin, CEA, sample to blood bank), Cycle 3  Rushie Chestnut, PA-C 12/3/20243:15 PM

## 2023-04-02 NOTE — Progress Notes (Signed)
Pltc = 132 today.  Per Clent Jacks, PA will reduce Oxaliplatin to 75 mg/m2.  Ebony Hail, Pharm.D., CPP 04/02/2023@11 :41 AM

## 2023-04-04 ENCOUNTER — Inpatient Hospital Stay: Payer: Medicare HMO

## 2023-04-04 VITALS — BP 132/80 | HR 70 | Temp 97.5°F | Resp 20

## 2023-04-04 DIAGNOSIS — Z5112 Encounter for antineoplastic immunotherapy: Secondary | ICD-10-CM | POA: Diagnosis not present

## 2023-04-04 DIAGNOSIS — C20 Malignant neoplasm of rectum: Secondary | ICD-10-CM

## 2023-04-04 MED ORDER — SODIUM CHLORIDE 0.9% FLUSH
10.0000 mL | INTRAVENOUS | Status: DC | PRN
  Administered 2023-04-04: 10 mL

## 2023-04-04 MED ORDER — HEPARIN SOD (PORK) LOCK FLUSH 100 UNIT/ML IV SOLN
500.0000 [IU] | Freq: Once | INTRAVENOUS | Status: AC | PRN
  Administered 2023-04-04: 500 [IU]

## 2023-04-04 NOTE — Patient Instructions (Signed)

## 2023-04-13 ENCOUNTER — Other Ambulatory Visit: Payer: Self-pay | Admitting: Internal Medicine

## 2023-04-16 ENCOUNTER — Encounter: Payer: Self-pay | Admitting: Medical Oncology

## 2023-04-16 ENCOUNTER — Inpatient Hospital Stay (HOSPITAL_BASED_OUTPATIENT_CLINIC_OR_DEPARTMENT_OTHER): Payer: Medicare HMO | Admitting: Medical Oncology

## 2023-04-16 ENCOUNTER — Inpatient Hospital Stay: Payer: Medicare HMO

## 2023-04-16 ENCOUNTER — Other Ambulatory Visit: Payer: Self-pay

## 2023-04-16 ENCOUNTER — Other Ambulatory Visit: Payer: Self-pay | Admitting: Hematology & Oncology

## 2023-04-16 VITALS — BP 139/89 | HR 80 | Resp 18

## 2023-04-16 VITALS — BP 122/73 | HR 77 | Temp 98.5°F | Resp 19 | Ht 70.0 in | Wt 208.0 lb

## 2023-04-16 DIAGNOSIS — D701 Agranulocytosis secondary to cancer chemotherapy: Secondary | ICD-10-CM

## 2023-04-16 DIAGNOSIS — C20 Malignant neoplasm of rectum: Secondary | ICD-10-CM

## 2023-04-16 DIAGNOSIS — T451X5A Adverse effect of antineoplastic and immunosuppressive drugs, initial encounter: Secondary | ICD-10-CM

## 2023-04-16 DIAGNOSIS — D500 Iron deficiency anemia secondary to blood loss (chronic): Secondary | ICD-10-CM

## 2023-04-16 DIAGNOSIS — D6481 Anemia due to antineoplastic chemotherapy: Secondary | ICD-10-CM | POA: Diagnosis not present

## 2023-04-16 DIAGNOSIS — Z5112 Encounter for antineoplastic immunotherapy: Secondary | ICD-10-CM | POA: Diagnosis not present

## 2023-04-16 LAB — CBC WITH DIFFERENTIAL (CANCER CENTER ONLY)
Abs Immature Granulocytes: 0.08 10*3/uL — ABNORMAL HIGH (ref 0.00–0.07)
Basophils Absolute: 0 10*3/uL (ref 0.0–0.1)
Basophils Relative: 1 %
Eosinophils Absolute: 0.1 10*3/uL (ref 0.0–0.5)
Eosinophils Relative: 1 %
HCT: 29.9 % — ABNORMAL LOW (ref 39.0–52.0)
Hemoglobin: 10.3 g/dL — ABNORMAL LOW (ref 13.0–17.0)
Immature Granulocytes: 2 %
Lymphocytes Relative: 12 %
Lymphs Abs: 0.6 10*3/uL — ABNORMAL LOW (ref 0.7–4.0)
MCH: 31.3 pg (ref 26.0–34.0)
MCHC: 34.4 g/dL (ref 30.0–36.0)
MCV: 90.9 fL (ref 80.0–100.0)
Monocytes Absolute: 0.7 10*3/uL (ref 0.1–1.0)
Monocytes Relative: 13 %
Neutro Abs: 3.7 10*3/uL (ref 1.7–7.7)
Neutrophils Relative %: 71 %
Platelet Count: 177 10*3/uL (ref 150–400)
RBC: 3.29 MIL/uL — ABNORMAL LOW (ref 4.22–5.81)
RDW: 20.9 % — ABNORMAL HIGH (ref 11.5–15.5)
WBC Count: 5.2 10*3/uL (ref 4.0–10.5)
nRBC: 0.6 % — ABNORMAL HIGH (ref 0.0–0.2)

## 2023-04-16 LAB — IRON AND IRON BINDING CAPACITY (CC-WL,HP ONLY)
Iron: 80 ug/dL (ref 45–182)
Saturation Ratios: 28 % (ref 17.9–39.5)
TIBC: 283 ug/dL (ref 250–450)
UIBC: 203 ug/dL (ref 117–376)

## 2023-04-16 LAB — CMP (CANCER CENTER ONLY)
ALT: 12 U/L (ref 0–44)
AST: 11 U/L — ABNORMAL LOW (ref 15–41)
Albumin: 3.7 g/dL (ref 3.5–5.0)
Alkaline Phosphatase: 64 U/L (ref 38–126)
Anion gap: 10 (ref 5–15)
BUN: 16 mg/dL (ref 8–23)
CO2: 27 mmol/L (ref 22–32)
Calcium: 9 mg/dL (ref 8.9–10.3)
Chloride: 103 mmol/L (ref 98–111)
Creatinine: 0.87 mg/dL (ref 0.61–1.24)
GFR, Estimated: >60 mL/min (ref >60)
Glucose, Bld: 209 mg/dL — ABNORMAL HIGH (ref 70–99)
Potassium: 3.2 mmol/L — ABNORMAL LOW (ref 3.5–5.1)
Sodium: 140 mmol/L (ref 135–145)
Total Bilirubin: 1 mg/dL (ref <1.2)
Total Protein: 6.1 g/dL — ABNORMAL LOW (ref 6.5–8.1)

## 2023-04-16 LAB — SAMPLE TO BLOOD BANK

## 2023-04-16 LAB — CEA (IN HOUSE-CHCC): CEA (CHCC-In House): 2.55 ng/mL (ref 0.00–5.00)

## 2023-04-16 LAB — FERRITIN: Ferritin: 532 ng/mL — ABNORMAL HIGH (ref 24–336)

## 2023-04-16 LAB — MAGNESIUM: Magnesium: 1.7 mg/dL (ref 1.7–2.4)

## 2023-04-16 MED ORDER — SODIUM CHLORIDE 0.9 % IV SOLN
6.0000 mg/kg | Freq: Once | INTRAVENOUS | Status: AC
  Administered 2023-04-16: 600 mg via INTRAVENOUS
  Filled 2023-04-16: qty 20

## 2023-04-16 MED ORDER — DEXTROSE 5 % IV SOLN: INTRAVENOUS | Status: DC

## 2023-04-16 MED ORDER — SODIUM CHLORIDE 0.9 % IV SOLN
2400.0000 mg/m2 | INTRAVENOUS | Status: DC
  Administered 2023-04-16: 5000 mg via INTRAVENOUS
  Filled 2023-04-16: qty 100

## 2023-04-16 MED ORDER — DEXAMETHASONE SODIUM PHOSPHATE 10 MG/ML IJ SOLN
10.0000 mg | Freq: Once | INTRAMUSCULAR | Status: AC
  Administered 2023-04-16: 10 mg via INTRAVENOUS
  Filled 2023-04-16: qty 1

## 2023-04-16 MED ORDER — OXALIPLATIN CHEMO INJECTION 100 MG/20ML
150.0000 mg | Freq: Once | INTRAVENOUS | Status: AC
  Administered 2023-04-16: 150 mg via INTRAVENOUS
  Filled 2023-04-16: qty 20

## 2023-04-16 MED ORDER — PALONOSETRON HCL INJECTION 0.25 MG/5ML
0.2500 mg | Freq: Once | INTRAVENOUS | Status: AC
  Administered 2023-04-16: 0.25 mg via INTRAVENOUS
  Filled 2023-04-16: qty 5

## 2023-04-16 MED ORDER — LEUCOVORIN CALCIUM INJECTION 350 MG
400.0000 mg/m2 | Freq: Once | INTRAVENOUS | Status: AC
  Administered 2023-04-16: 872 mg via INTRAVENOUS
  Filled 2023-04-16: qty 43.6

## 2023-04-16 MED ORDER — FLUOROURACIL CHEMO INJECTION 2.5 GM/50ML
400.0000 mg/m2 | Freq: Once | INTRAVENOUS | Status: AC
Start: 2023-04-16 — End: 2023-04-16
  Administered 2023-04-16: 850 mg via INTRAVENOUS
  Filled 2023-04-16: qty 17

## 2023-04-16 MED ORDER — SODIUM CHLORIDE 0.9 % IV SOLN
INTRAVENOUS | Status: DC
Start: 2023-04-16 — End: 2023-04-16

## 2023-04-16 NOTE — Patient Instructions (Signed)

## 2023-04-16 NOTE — Progress Notes (Signed)
Continue Oxaliplatin 75mg /m2 today.  Ok to proceed with 5FU bolus this cycle.  Anola Gurney [place], [place], BCPS, BCOP 04/16/2023 11:44 AM

## 2023-04-16 NOTE — Patient Instructions (Signed)
[facility] - A DEPT OF [facility]  Discharge Instructions: Thank you for choosing [facility] to provide your oncology and hematology care.   If you have a lab appointment with the Cancer Center, please go directly to the Cancer Center and check in at the registration area.  Wear comfortable clothing and clothing appropriate for easy access to any Portacath or PICC line.   We strive to give you quality time with your provider. You may need to reschedule your appointment if you arrive late (15 or more minutes).  Arriving late affects you and other patients whose appointments are after yours.  Also, if you miss three or more appointments without notifying the office, you may be dismissed from the clinic at the provider's discretion.      For prescription refill requests, have your pharmacy contact our office and allow 72 hours for refills to be completed.    Today you received the following chemotherapy and/or immunotherapy agents Vectibix/Oxaliplatin/Leucovorinl/ 5 FU       To help prevent nausea and vomiting after your treatment, we encourage you to take your nausea medication as directed.  BELOW ARE SYMPTOMS THAT SHOULD BE REPORTED IMMEDIATELY: *FEVER GREATER THAN 100.4 F (38 C) OR HIGHER *CHILLS OR SWEATING *NAUSEA AND VOMITING THAT IS NOT CONTROLLED WITH YOUR NAUSEA MEDICATION *UNUSUAL SHORTNESS OF BREATH *UNUSUAL BRUISING OR BLEEDING *URINARY PROBLEMS (pain or burning when urinating, or frequent urination) *BOWEL PROBLEMS (unusual diarrhea, constipation, pain near the anus) TENDERNESS IN MOUTH AND THROAT WITH OR WITHOUT PRESENCE OF ULCERS (sore throat, sores in mouth, or a toothache) UNUSUAL RASH, SWELLING OR PAIN  UNUSUAL VAGINAL DISCHARGE OR ITCHING   Items with * indicate a potential emergency and should be followed up as soon as possible or go to the Emergency Department if any problems should occur.  Please show the CHEMOTHERAPY  ALERT CARD or IMMUNOTHERAPY ALERT CARD at check-in to the Emergency Department and triage nurse. Should you have questions after your visit or need to cancel or reschedule your appointment, please contact [facility] - A DEPT OF [facility]  [phone] and follow the prompts.  Office hours are 8:00 a.m. to 4:30 p.m. Monday - Friday. Please note that voicemails left after 4:00 p.m. may not be returned until the following business day.  We are closed weekends and major holidays. You have access to a nurse at all times for urgent questions. Please call the main number to the clinic [phone] and follow the prompts.  For any non-urgent questions, you may also contact your provider using MyChart. We now offer e-Visits for anyone 62 and older to request care online for non-urgent symptoms. For details visit [email or web address].   Also download the MyChart app! Go to the app store, search "MyChart", open the app, select [facility], and log in with your MyChart username and password.

## 2023-04-16 NOTE — Progress Notes (Signed)
Hematology and Oncology Follow Up Visit  John Douglas [id number] 12-Mar-1956 67 y.o. 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 twice daily Aspirin 81 mg p.o. daily FOLFOX/Vectibix --start on 03/07/2023; Dose adjusted for cycle 2 Xgeva 120 mg subcu every 3 months-start 03/07/2023   Interim History:  Mr. Deets is here today for follow-up and consideration of FOLFOX with Vectibix Cycle 3.   Today he states that he is doing well. He had a slight rash around his nose after last treatment but this has almost fully resolved. He tolerated cycle 2 much better than cycle 3.    No fevers, bloody movements, nausea  He continues on the Eliquis for his atrial fibrillation.  He has had no problems with nausea or vomiting.  He has had no cough or shortness of breath.    Of note, the last on that we saw him, his CEA level was 4.07- this is up a bit from 2.65 1 month prior but has never really been elevated. Today's value is pending   He has had no leg swelling.  He has had no neuropathy.  Overall, I would say that his performance status is probably ECOG 1. Wt Readings from Last 3 Encounters:  04/16/23 208 lb (94.3 kg)  04/02/23 213 lb (96.6 kg)  03/19/23 212 lb 12.8 oz (96.5 kg)    Medications:  Allergies as of 04/16/2023       Reactions   Celexa [citalopram] Other (See Comments)   Contraindicated with A-Fib   Ibuprofen Other (See Comments)   Was told to not take this    Metformin And Related  Other (See Comments)   Bloody stools    Naproxen Other (See Comments)   Was told to not take this    Yellow Jacket Venom [bee Venom] Swelling, Other (See Comments)   Severe swelling where stung   Penicillins Hives   Did it involve swelling of the face/tongue/throat, SOB, or low BP? Unk Did it involve sudden or severe rash/hives, skin peeling, or any reaction on the inside of your mouth or nose? Yes Did you need to seek medical attention at a hospital or doctor's office? Unk When did it last happen? "Childhood- 55 years ago" If all above answers are "NO", may proceed with cephalosporin use.        Medication List        Accurate as of April 16, 2023 10:21 AM. If you have any questions, ask your nurse or doctor.          diltiazem 240 MG 24 hr capsule Commonly known as: CARDIZEM CD TAKE 1 CAPSULE BY MOUTH EVERY DAY   Eliquis 5 MG Tabs tablet Generic drug: apixaban TAKE 1 TABLET BY MOUTH TWICE A DAY   ezetimibe 10 MG tablet Commonly known as: ZETIA Take 1 tablet by mouth once daily   fluticasone 50 MCG/ACT nasal spray Commonly known as: Flonase Place 1 spray into both nostrils 2 (two) times daily as needed (sinus congestion).   furosemide 40 MG tablet Commonly known as: LASIX TAKE 1 TABLET BY MOUTH EVERY  DAY   glimepiride 1 MG tablet Commonly known as: AMARYL Take 1 mg by mouth daily.   HYDROcodone-acetaminophen 5-325 MG tablet Commonly known as: Norco Take 1 tablet by mouth every 6 (six) hours as needed for moderate pain (pain score 4-6).   Klor-Con M20 20 MEQ tablet Generic drug: potassium chloride SA TAKE 2 TABLETS BY MOUTH EVERY DAY   metoprolol succinate 50 MG 24 hr tablet Commonly known as: TOPROL-XL Take 1 tablet (50 mg total) by mouth daily.   nitroGLYCERIN 0.4 MG SL tablet Commonly known as: NITROSTAT Place 1 tablet (0.4 mg total) under the tongue every 5 (five) minutes as needed for chest pain.   ondansetron 8 MG tablet Commonly known as:  Zofran Take 1 tablet (8 mg total) by mouth every 8 (eight) hours as needed for nausea or vomiting. Start on the third day after chemotherapy.   pramoxine-hydrocortisone 1-1 % rectal cream Commonly known as: PROCTOCREAM-HC Place 1 application rectally 2 (two) times daily as needed for hemorrhoids or anal itching.   prochlorperazine 10 MG tablet Commonly known as: COMPAZINE Take 1 tablet (10 mg total) by mouth every 6 (six) hours as needed for nausea or vomiting.        Allergies:  Allergies  Allergen Reactions   Celexa [Citalopram] Other (See Comments)    Contraindicated with A-Fib   Ibuprofen Other (See Comments)    Was told to not take this    Metformin And Related Other (See Comments)    Bloody stools    Naproxen Other (See Comments)    Was told to not take this    Yellow Jacket Venom [Bee Venom] Swelling and Other (See Comments)    Severe swelling where stung   Penicillins Hives    Did it involve swelling of the face/tongue/throat, SOB, or low BP? Unk Did it involve sudden or severe rash/hives, skin peeling, or any reaction on the inside of your mouth or nose? Yes Did you need to seek medical attention at a hospital or doctor's office? Unk When did it last happen? "Childhood- 53 years ago" If all above answers are "NO", may proceed with cephalosporin use.     Past Medical History, Surgical history, Social history, and Family History were reviewed and updated.  Review of Systems:  Review of Systems  Constitutional: Negative.   HENT: Negative.    Eyes: Negative.   Respiratory: Negative.    Cardiovascular: Negative.   Gastrointestinal:  Negative for diarrhea.  Genitourinary: Negative.   Musculoskeletal:  Negative for joint pain.  Skin: Negative.   Neurological: Negative.   Endo/Heme/Allergies: Negative.   Psychiatric/Behavioral: Negative.       Physical Exam:  Vitals:   04/16/23 0933  BP: 122/73  Pulse: 77  Resp: 19  Temp: 98.5 F (36.9 C)  SpO2: 98%     Wt Readings from Last 3 Encounters:  04/16/23 208 lb (94.3 kg)  04/02/23 213 lb (96.6 kg)  03/19/23 212 lb 12.8 oz (96.5 kg)    Physical Exam Vitals reviewed.  HENT:     Head: Normocephalic and atraumatic.  Eyes:     Pupils: Pupils are equal, round, and reactive to light.  Cardiovascular:     Rate and Rhythm: Normal rate and regular rhythm.     Heart sounds: Normal heart sounds.  Pulmonary:     Effort: Pulmonary effort is normal.     Breath sounds: Normal breath sounds.  Abdominal:     General: Bowel sounds are normal.  Palpations: Abdomen is soft.  Musculoskeletal:        General: No tenderness or deformity. Normal range of motion.     Cervical back: Normal range of motion.  Lymphadenopathy:     Cervical: No cervical adenopathy.  Skin:    General: Skin is warm and dry.     Findings: No erythema or rash.  Neurological:     Mental Status: He is alert and oriented to person, place, and time.  Psychiatric:        Behavior: Behavior normal.        Thought Content: Thought content normal.        Judgment: Judgment normal.     Lab Results  Component Value Date   WBC 5.2 04/16/2023   HGB 10.3 (L) 04/16/2023   HCT 29.9 (L) 04/16/2023   MCV 90.9 04/16/2023   PLT 177 04/16/2023   Lab Results  Component Value Date   FERRITIN 374 (H) 02/14/2021   IRON 125 02/14/2021   TIBC 329 02/14/2021   UIBC 204 02/14/2021   IRONPCTSAT 38 02/14/2021   Lab Results  Component Value Date   RETICCTPCT 5.4 (H) 08/15/2020   RBC 3.29 (L) 04/16/2023   No results found for: "KPAFRELGTCHN", "LAMBDASER", "KAPLAMBRATIO" No results found for: "IGGSERUM", "IGA", "IGMSERUM" No results found for: "TOTALPROTELP", "ALBUMINELP", "A1GS", "A2GS", "BETS", "BETA2SER", "GAMS", "MSPIKE", "SPEI"   Chemistry      Component Value Date/Time   NA 140 04/16/2023 0920   NA 140 08/28/2019 0940   K 3.2 (L) 04/16/2023 0920   CL 103 04/16/2023 0920   CO2 27 04/16/2023 0920   BUN 16 04/16/2023 0920    BUN 21 08/28/2019 0940   CREATININE 0.87 04/16/2023 0920      Component Value Date/Time   CALCIUM 9.0 04/16/2023 0920   ALKPHOS 64 04/16/2023 0920   AST 11 (L) 04/16/2023 0920   ALT 12 04/16/2023 0920   BILITOT 1.0 04/16/2023 0920     Encounter Diagnoses  Name Primary?   Rectal cancer (HCC) Yes   Iron deficiency anemia due to chronic blood loss    Anemia due to antineoplastic chemotherapy     Impression and Plan: Mr. Orrick is a pleasant 67 yo caucasian gentleman with history of CVA secondary to atrial fib and rectal bleeding. He was diagnosed with metastatic adenocarcinoma of the rectum -K-ras wild type/BRAF wild-type/HER-2 negative/MMR proficient. He had adenopathy distant to the rectum. He now has disease recurrence.  He has finished radiation therapy.   Today labs reviewed and acceptable for cycle 3 of treatment. Plan will still be to repeat a PET scan after cycle 4.   TX today  RTC 2 weeks MD, labs (CBC w/, CMP, iron, ferritin, CEA, sample to blood bank), Cycle 4, PET to be scheduled at this visit   Rushie Chestnut, PA-C 12/17/202410:21 AM

## 2023-04-18 ENCOUNTER — Inpatient Hospital Stay: Payer: Medicare HMO

## 2023-04-18 VITALS — BP 137/84 | HR 60 | Temp 98.0°F | Resp 17

## 2023-04-18 DIAGNOSIS — C20 Malignant neoplasm of rectum: Secondary | ICD-10-CM

## 2023-04-18 DIAGNOSIS — K5730 Diverticulosis of large intestine without perforation or abscess without bleeding: Secondary | ICD-10-CM

## 2023-04-18 DIAGNOSIS — K625 Hemorrhage of anus and rectum: Secondary | ICD-10-CM

## 2023-04-18 DIAGNOSIS — Z5112 Encounter for antineoplastic immunotherapy: Secondary | ICD-10-CM | POA: Diagnosis not present

## 2023-04-18 MED ORDER — DENOSUMAB 120 MG/1.7ML ~~LOC~~ SOLN
120.0000 mg | Freq: Once | SUBCUTANEOUS | Status: AC
  Administered 2023-04-18: 120 mg via SUBCUTANEOUS
  Filled 2023-04-18: qty 1.7

## 2023-04-18 MED ORDER — HEPARIN SOD (PORK) LOCK FLUSH 100 UNIT/ML IV SOLN
500.0000 [IU] | Freq: Once | INTRAVENOUS | Status: AC | PRN
Start: 2023-04-18 — End: 2023-04-18
  Administered 2023-04-18: 500 [IU]

## 2023-04-18 MED ORDER — SODIUM CHLORIDE 0.9% FLUSH
10.0000 mL | INTRAVENOUS | Status: DC | PRN
  Administered 2023-04-18: 10 mL

## 2023-04-18 NOTE — Progress Notes (Addendum)
Ok to start [place] today. No planned or scheduled dental procedures.  John Douglas, [place], BCPS. BCOP 04/18/2023 1:12 PM

## 2023-04-18 NOTE — Patient Instructions (Signed)
Denosumab Injection (Oncology) What is this medication? DENOSUMAB (den oh SUE mab) prevents weakened bones caused by cancer. It may also be used to treat noncancerous bone tumors that cannot be removed by surgery. It can also be used to treat high calcium levels in the blood caused by cancer. It works by blocking a protein that causes bones to break down quickly. This slows down the release of calcium from bones, which lowers calcium levels in your blood. It also makes your bones stronger and less likely to break (fracture). This medicine may be used for other purposes; ask your health care provider or pharmacist if you have questions. COMMON BRAND NAME(S): XGEVA What should I tell my care team before I take this medication? They need to know if you have any of these conditions: Dental disease Having surgery or tooth extraction Infection Kidney disease Low levels of calcium or vitamin D in the blood Malnutrition On hemodialysis Skin conditions or sensitivity Thyroid or parathyroid disease An unusual reaction to denosumab, other medications, foods, dyes, or preservatives Pregnant or trying to get pregnant Breast-feeding How should I use this medication? This medication is for injection under the skin. It is given by your care team in a hospital or clinic setting. A special MedGuide will be given to you before each treatment. Be sure to read this information carefully each time. Talk to your care team about the use of this medication in children. While it may be prescribed for children as young as 13 years for selected conditions, precautions do apply. Overdosage: If you think you have taken too much of this medicine contact a poison control center or emergency room at once. NOTE: This medicine is only for you. Do not share this medicine with others. What if I miss a dose? Keep appointments for follow-up doses. It is important not to miss your dose. Call your care team if you are unable to  keep an appointment. What may interact with this medication? Do not take this medication with any of the following: Other medications containing denosumab This medication may also interact with the following: Medications that lower your chance of fighting infection Steroid medications, such as prednisone or cortisone This list may not describe all possible interactions. Give your health care provider a list of all the medicines, herbs, non-prescription drugs, or dietary supplements you use. Also tell them if you smoke, drink alcohol, or use illegal drugs. Some items may interact with your medicine. What should I watch for while using this medication? Your condition will be monitored carefully while you are receiving this medication. You may need blood work while taking this medication. This medication may increase your risk of getting an infection. Call your care team for advice if you get a fever, chills, sore throat, or other symptoms of a cold or flu. Do not treat yourself. Try to avoid being around people who are sick. You should make sure you get enough calcium and vitamin D while you are taking this medication, unless your care team tells you not to. Discuss the foods you eat and the vitamins you take with your care team. Some people who take this medication have severe bone, joint, or muscle pain. This medication may also increase your risk for jaw problems or a broken thigh bone. Tell your care team right away if you have severe pain in your jaw, bones, joints, or muscles. Tell your care team if you have any pain that does not go away or that gets worse. Talk   to your care team if you may be pregnant. Serious birth defects can occur if you take this medication during pregnancy and for 5 months after the last dose. You will need a negative pregnancy test before starting this medication. Contraception is recommended while taking this medication and for 5 months after the last dose. Your care team  can help you find the option that works for you. What side effects may I notice from receiving this medication? Side effects that you should report to your care team as soon as possible: Allergic reactions--skin rash, itching, hives, swelling of the face, lips, tongue, or throat Bone, joint, or muscle pain Low calcium level--muscle pain or cramps, confusion, tingling, or numbness in the hands or feet Osteonecrosis of the jaw--pain, swelling, or redness in the mouth, numbness of the jaw, poor healing after dental work, unusual discharge from the mouth, visible bones in the mouth Side effects that usually do not require medical attention (report to your care team if they continue or are bothersome): Cough Diarrhea Fatigue Headache Nausea This list may not describe all possible side effects. Call your doctor for medical advice about side effects. You may report side effects to FDA at 1-800-FDA-1088. Where should I keep my medication? This medication is given in a hospital or clinic. It will not be stored at home. NOTE: This sheet is a summary. It may not cover all possible information. If you have questions about this medicine, talk to your doctor, pharmacist, or health care provider.  2024 Elsevier/Gold Standard (2021-09-06 00:00:00)  

## 2023-04-22 ENCOUNTER — Ambulatory Visit: Payer: Self-pay | Admitting: Radiation Oncology

## 2023-04-26 ENCOUNTER — Telehealth: Payer: Self-pay | Admitting: *Deleted

## 2023-04-26 ENCOUNTER — Encounter: Payer: Self-pay | Admitting: Radiation Oncology

## 2023-04-26 NOTE — Telephone Encounter (Signed)
RETURNED PATIENT'S PHONE CALL, SPOKE WITH PATIENT. ?

## 2023-04-28 ENCOUNTER — Other Ambulatory Visit: Payer: Self-pay | Admitting: Physician Assistant

## 2023-04-28 DIAGNOSIS — Z85048 Personal history of other malignant neoplasm of rectum, rectosigmoid junction, and anus: Secondary | ICD-10-CM

## 2023-04-30 ENCOUNTER — Inpatient Hospital Stay: Payer: Medicare HMO

## 2023-04-30 ENCOUNTER — Other Ambulatory Visit: Payer: Self-pay

## 2023-04-30 ENCOUNTER — Encounter: Payer: Self-pay | Admitting: Hematology & Oncology

## 2023-04-30 ENCOUNTER — Inpatient Hospital Stay (HOSPITAL_BASED_OUTPATIENT_CLINIC_OR_DEPARTMENT_OTHER): Payer: Medicare HMO | Admitting: Hematology & Oncology

## 2023-04-30 VITALS — BP 119/66 | HR 72 | Temp 98.0°F | Resp 19 | Ht 70.0 in | Wt 209.0 lb

## 2023-04-30 DIAGNOSIS — Z5112 Encounter for antineoplastic immunotherapy: Secondary | ICD-10-CM | POA: Diagnosis not present

## 2023-04-30 DIAGNOSIS — C20 Malignant neoplasm of rectum: Secondary | ICD-10-CM

## 2023-04-30 LAB — CBC WITH DIFFERENTIAL (CANCER CENTER ONLY)
Abs Immature Granulocytes: 0.02 10*3/uL (ref 0.00–0.07)
Basophils Absolute: 0 10*3/uL (ref 0.0–0.1)
Basophils Relative: 0 %
Eosinophils Absolute: 0.1 10*3/uL (ref 0.0–0.5)
Eosinophils Relative: 2 %
HCT: 25.7 % — ABNORMAL LOW (ref 39.0–52.0)
Hemoglobin: 8.8 g/dL — ABNORMAL LOW (ref 13.0–17.0)
Immature Granulocytes: 1 %
Lymphocytes Relative: 20 %
Lymphs Abs: 0.7 10*3/uL (ref 0.7–4.0)
MCH: 32.8 pg (ref 26.0–34.0)
MCHC: 34.2 g/dL (ref 30.0–36.0)
MCV: 95.9 fL (ref 80.0–100.0)
Monocytes Absolute: 0.4 10*3/uL (ref 0.1–1.0)
Monocytes Relative: 11 %
Neutro Abs: 2.4 10*3/uL (ref 1.7–7.7)
Neutrophils Relative %: 66 %
Platelet Count: 133 10*3/uL — ABNORMAL LOW (ref 150–400)
RBC: 2.68 MIL/uL — ABNORMAL LOW (ref 4.22–5.81)
RDW: 24.8 % — ABNORMAL HIGH (ref 11.5–15.5)
WBC Count: 3.6 10*3/uL — ABNORMAL LOW (ref 4.0–10.5)
nRBC: 1.6 % — ABNORMAL HIGH (ref 0.0–0.2)

## 2023-04-30 LAB — CMP (CANCER CENTER ONLY)
ALT: 14 U/L (ref 0–44)
AST: 12 U/L — ABNORMAL LOW (ref 15–41)
Albumin: 3.6 g/dL (ref 3.5–5.0)
Alkaline Phosphatase: 56 U/L (ref 38–126)
Anion gap: 8 (ref 5–15)
BUN: 15 mg/dL (ref 8–23)
CO2: 27 mmol/L (ref 22–32)
Calcium: 8.7 mg/dL — ABNORMAL LOW (ref 8.9–10.3)
Chloride: 105 mmol/L (ref 98–111)
Creatinine: 0.88 mg/dL (ref 0.61–1.24)
GFR, Estimated: >60 mL/min (ref >60)
Glucose, Bld: 224 mg/dL — ABNORMAL HIGH (ref 70–99)
Potassium: 3.6 mmol/L (ref 3.5–5.1)
Sodium: 140 mmol/L (ref 135–145)
Total Bilirubin: 1 mg/dL (ref 0.0–1.2)
Total Protein: 5.8 g/dL — ABNORMAL LOW (ref 6.5–8.1)

## 2023-04-30 MED ORDER — DEXAMETHASONE SODIUM PHOSPHATE 10 MG/ML IJ SOLN
10.0000 mg | Freq: Once | INTRAMUSCULAR | Status: AC
  Administered 2023-04-30: 10 mg via INTRAVENOUS
  Filled 2023-04-30: qty 1

## 2023-04-30 MED ORDER — PANITUMUMAB CHEMO INJECTION 100 MG/5ML
6.0000 mg/kg | Freq: Once | INTRAVENOUS | Status: AC
  Administered 2023-04-30: 600 mg via INTRAVENOUS
  Filled 2023-04-30: qty 20

## 2023-04-30 MED ORDER — DOXYCYCLINE HYCLATE 100 MG PO TABS: 100.0000 mg | ORAL_TABLET | Freq: Two times a day (BID) | ORAL | 4 refills | Status: DC

## 2023-04-30 MED ORDER — DEXTROSE 5 % IV SOLN
85.0000 mg/m2 | Freq: Once | INTRAVENOUS | Status: AC
  Administered 2023-04-30: 200 mg via INTRAVENOUS
  Filled 2023-04-30: qty 40

## 2023-04-30 MED ORDER — FLUOROURACIL CHEMO INJECTION 2.5 GM/50ML
400.0000 mg/m2 | Freq: Once | INTRAVENOUS | Status: AC
  Administered 2023-04-30: 850 mg via INTRAVENOUS
  Filled 2023-04-30: qty 17

## 2023-04-30 MED ORDER — PALONOSETRON HCL INJECTION 0.25 MG/5ML
0.2500 mg | Freq: Once | INTRAVENOUS | Status: AC
  Administered 2023-04-30: 0.25 mg via INTRAVENOUS
  Filled 2023-04-30: qty 5

## 2023-04-30 MED ORDER — LEUCOVORIN CALCIUM INJECTION 350 MG
400.0000 mg/m2 | Freq: Once | INTRAVENOUS | Status: AC
  Administered 2023-04-30: 872 mg via INTRAVENOUS
  Filled 2023-04-30: qty 43.6

## 2023-04-30 MED ORDER — FLUOROURACIL CHEMO INJECTION 5 GM/100ML
2400.0000 mg/m2 | INTRAVENOUS | Status: DC
  Administered 2023-04-30: 5000 mg via INTRAVENOUS
  Filled 2023-04-30: qty 100

## 2023-04-30 MED ORDER — DEXTROSE 5 % IV SOLN
INTRAVENOUS | Status: DC
Start: 2023-04-30 — End: 2023-04-30

## 2023-04-30 MED ORDER — SULFACETAMIDE SODIUM 10 % OP SOLN: 1.0000 [drp] | OPHTHALMIC | 3 refills | Status: DC

## 2023-04-30 MED ORDER — SODIUM CHLORIDE 0.9 % IV SOLN: INTRAVENOUS | Status: DC

## 2023-04-30 NOTE — Patient Instructions (Signed)

## 2023-04-30 NOTE — Progress Notes (Signed)
 Hematology and Oncology Follow Up Visit  John Douglas [id number] January 12, 1956 67 y.o. 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 twice daily Aspirin  81 mg p.o. daily FOLFOX/Vectibix  --start on 03/07/2023; s/p cycle #3 Xgeva  120 mg subcu every 3 months-start 03/07/2023   Interim History:  [name] is here today for follow-up.  So far, I think is tolerated treatment pretty well.  He does have a little bit of dermatitis on the face.  I will start him on some doxycycline .  He has little bit of discharge from the eyes.  I will put him on some eyedrops for this.  His last CEA was down to 2.7.  He feels okay.  He really has not had much of a rash with the Vectibix .  He has had no problems with nausea or vomiting.  Had a episode of diarrhea which resolved on its own.  John Douglas continues on Eliquis .  He is doing well on the Eliquis .  He has had no issues with headache.  He has had no mouth sores.  He has had no leg swelling.  He has had no palpitations.  He has had no problems with urinating.  His back hurts a little bit.  He really does not want any more Xgeva .  He thinks he Xgeva  really cause problems for him.  Currently, I would say his performance status by ECOG 1.   Wt Readings from Last 3 Encounters:  04/30/23 209 lb (94.8 kg)  04/16/23 208 lb (94.3 kg)  04/02/23 213 lb (96.6 kg)    Medications:  Allergies as of 04/30/2023       Reactions   Celexa  [citalopram ] Other (See Comments)    Contraindicated with A-Fib   Ibuprofen Other (See Comments)   Was told to not take this    Metformin And Related Other (See Comments)   Bloody stools    Naproxen Other (See Comments)   Was told to not take this    Yellow Jacket Venom [bee Venom] Swelling, Other (See Comments)   Severe swelling where stung   Penicillins Hives   Did it involve swelling of the face/tongue/throat, SOB, or low BP? Unk Did it involve sudden or severe rash/hives, skin peeling, or any reaction on the inside of your mouth or nose? Yes Did you need to seek medical attention at a hospital or doctor's office? Unk When did it last happen? Childhood- 55 years ago If all above answers are NO, may proceed with cephalosporin use.        Medication List        Accurate as of April 30, 2023  8:19 AM. If you have any questions, ask your nurse or doctor.          diltiazem  240 MG 24 hr capsule Commonly known as: CARDIZEM  CD TAKE 1 CAPSULE BY MOUTH EVERY DAY   Eliquis  5 MG Tabs tablet Generic drug: apixaban  TAKE 1 TABLET BY MOUTH TWICE A DAY   ezetimibe  10 MG tablet Commonly known as: ZETIA  Take 1 tablet by mouth once daily   fluticasone  50 MCG/ACT nasal  spray Commonly known as: Flonase  Place 1 spray into both nostrils 2 (two) times daily as needed (sinus congestion).   furosemide  40 MG tablet Commonly known as: LASIX  TAKE 1 TABLET BY MOUTH EVERY DAY   glimepiride  1 MG tablet Commonly known as: AMARYL  Take 1 mg by mouth daily.   HYDROcodone -acetaminophen  5-325 MG tablet Commonly known as: Norco Take 1 tablet by mouth every 6 (six) hours as needed for moderate pain (pain score 4-6).   Klor-Con  M20 20 MEQ tablet Generic drug: potassium chloride  SA TAKE 2 TABLETS BY MOUTH EVERY DAY   metoprolol  succinate 50 MG 24 hr tablet Commonly known as: TOPROL -XL Take 1 tablet (50 mg total) by mouth daily.   nitroGLYCERIN  0.4 MG SL tablet Commonly known as: NITROSTAT  Place 1 tablet (0.4 mg total)  under the tongue every 5 (five) minutes as needed for chest pain.   ondansetron  8 MG tablet Commonly known as: Zofran  Take 1 tablet (8 mg total) by mouth every 8 (eight) hours as needed for nausea or vomiting. Start on the third day after chemotherapy.   pramoxine-hydrocortisone  1-1 % rectal cream Commonly known as: PROCTOCREAM-HC Place 1 application rectally 2 (two) times daily as needed for hemorrhoids or anal itching.   prochlorperazine  10 MG tablet Commonly known as: COMPAZINE  Take 1 tablet (10 mg total) by mouth every 6 (six) hours as needed for nausea or vomiting.        Allergies:  Allergies  Allergen Reactions   Celexa  [Citalopram ] Other (See Comments)    Contraindicated with A-Fib   Ibuprofen Other (See Comments)    Was told to not take this    Metformin And Related Other (See Comments)    Bloody stools    Naproxen Other (See Comments)    Was told to not take this    Yellow Jacket Venom [Bee Venom] Swelling and Other (See Comments)    Severe swelling where stung   Penicillins Hives    Did it involve swelling of the face/tongue/throat, SOB, or low BP? Unk Did it involve sudden or severe rash/hives, skin peeling, or any reaction on the inside of your mouth or nose? Yes Did you need to seek medical attention at a hospital or doctor's office? Unk When did it last happen? Childhood- 53 years ago If all above answers are NO, may proceed with cephalosporin use.     Past Medical History, Surgical history, Social history, and Family History were reviewed and updated.  Review of Systems:  Review of Systems  Constitutional: Negative.   HENT: Negative.    Eyes: Negative.   Respiratory: Negative.    Cardiovascular: Negative.   Gastrointestinal:  Negative for diarrhea.  Genitourinary: Negative.   Musculoskeletal:  Negative for joint pain.  Skin: Negative.   Neurological: Negative.   Endo/Heme/Allergies: Negative.   Psychiatric/Behavioral: Negative.        Physical Exam:  Vitals:   04/30/23 0800  BP: 119/66  Pulse: 72  Resp: 19  Temp: 98 F (36.7 C)  SpO2: 100%    Wt Readings from Last 3 Encounters:  04/30/23 209 lb (94.8 kg)  04/16/23 208 lb (94.3 kg)  04/02/23 213 lb (96.6 kg)    Physical Exam Vitals reviewed.  HENT:     Head: Normocephalic and atraumatic.  Eyes:     Pupils: Pupils are equal, round, and reactive to light.  Cardiovascular:     Rate and Rhythm: Normal rate and regular rhythm.     Heart sounds: Normal heart sounds.  Pulmonary:     Effort: Pulmonary effort is normal.     Breath sounds: Normal breath sounds.  Abdominal:     General: Bowel sounds are normal.     Palpations: Abdomen is soft.  Musculoskeletal:        General: No tenderness or deformity. Normal range of motion.     Cervical back: Normal range of motion.  Lymphadenopathy:     Cervical: No cervical adenopathy.  Skin:    General: Skin is warm and dry.     Findings: No erythema or rash.  Neurological:     Mental Status: He is alert and oriented to person, place, and time.  Psychiatric:        Behavior: Behavior normal.        Thought Content: Thought content normal.        Judgment: Judgment normal.     Lab Results  Component Value Date   WBC 5.2 04/16/2023   HGB 10.3 (L) 04/16/2023   HCT 29.9 (L) 04/16/2023   MCV 90.9 04/16/2023   PLT 177 04/16/2023   Lab Results  Component Value Date   FERRITIN 532 (H) 04/16/2023   IRON 80 04/16/2023   TIBC 283 04/16/2023   UIBC 203 04/16/2023   IRONPCTSAT 28 04/16/2023   Lab Results  Component Value Date   RETICCTPCT 5.4 (H) 08/15/2020   RBC 3.29 (L) 04/16/2023   No results found for: KPAFRELGTCHN, LAMBDASER, KAPLAMBRATIO No results found for: IGGSERUM, IGA, IGMSERUM No results found for: STEPHANY CARLOTA BENSON MARKEL EARLA JOANNIE DOC VICK, SPEI   Chemistry      Component Value Date/Time   NA 140 04/16/2023 0920   NA 140  08/28/2019 0940   K 3.2 (L) 04/16/2023 0920   CL 103 04/16/2023 0920   CO2 27 04/16/2023 0920   BUN 16 04/16/2023 0920   BUN 21 08/28/2019 0940   CREATININE 0.87 04/16/2023 0920      Component Value Date/Time   CALCIUM  9.0 04/16/2023 0920   ALKPHOS 64 04/16/2023 0920   AST 11 (L) 04/16/2023 0920   ALT 12 04/16/2023 0920   BILITOT 1.0 04/16/2023 0920        Impression and Plan: Mr. Simonian is a pleasant 67 yo caucasian gentleman with history of CVA secondary to atrial fib and rectal bleeding. He was diagnosed with metastatic adenocarcinoma of the rectum -K-ras wild type/BRAF wild-type/HER-2 negative/MMR proficient. He had adenopathy distant to the rectum. He now has disease recurrence.  He has finished radiation therapy.   We will go with his fourth cycle of chemotherapy.  After this cycle, I will give an extra week off.  I will then do a PET scan on him.  Hopefully, we will see that he has had a good response.   Maude JONELLE Crease, MD 12/31/20248:19 AM

## 2023-04-30 NOTE — Patient Instructions (Signed)
 [facility] - A DEPT OF [facility]  Discharge Instructions: Thank you for choosing [facility] to provide your oncology and hematology care.   If you have a lab appointment with the Cancer Center, please go directly to the Cancer Center and check in at the registration area.  Wear comfortable clothing and clothing appropriate for easy access to any Portacath or PICC line.   We strive to give you quality time with your provider. You may need to reschedule your appointment if you arrive late (15 or more minutes).  Arriving late affects you and other patients whose appointments are after yours.  Also, if you miss three or more appointments without notifying the office, you may be dismissed from the clinic at the provider's discretion.      For prescription refill requests, have your pharmacy contact our office and allow 72 hours for refills to be completed.    Today you received the following chemotherapy and/or immunotherapy agents Vectibix /Oxaliplatin /Leucovorin /5 FU       To help prevent nausea and vomiting after your treatment, we encourage you to take your nausea medication as directed.  BELOW ARE SYMPTOMS THAT SHOULD BE REPORTED IMMEDIATELY: *FEVER GREATER THAN 100.4 F (38 C) OR HIGHER *CHILLS OR SWEATING *NAUSEA AND VOMITING THAT IS NOT CONTROLLED WITH YOUR NAUSEA MEDICATION *UNUSUAL SHORTNESS OF BREATH *UNUSUAL BRUISING OR BLEEDING *URINARY PROBLEMS (pain or burning when urinating, or frequent urination) *BOWEL PROBLEMS (unusual diarrhea, constipation, pain near the anus) TENDERNESS IN MOUTH AND THROAT WITH OR WITHOUT PRESENCE OF ULCERS (sore throat, sores in mouth, or a toothache) UNUSUAL RASH, SWELLING OR PAIN  UNUSUAL VAGINAL DISCHARGE OR ITCHING   Items with * indicate a potential emergency and should be followed up as soon as possible or go to the Emergency Department if any problems should occur.  Please show the CHEMOTHERAPY  ALERT CARD or IMMUNOTHERAPY ALERT CARD at check-in to the Emergency Department and triage nurse. Should you have questions after your visit or need to cancel or reschedule your appointment, please contact [facility] - A DEPT OF [facility]  [phone] and follow the prompts.  Office hours are 8:00 a.m. to 4:30 p.m. Monday - Friday. Please note that voicemails left after 4:00 p.m. may not be returned until the following business day.  We are closed weekends and major holidays. You have access to a nurse at all times for urgent questions. Please call the main number to the clinic [phone] and follow the prompts.  For any non-urgent questions, you may also contact your provider using MyChart. We now offer e-Visits for anyone 23 and older to request care online for non-urgent symptoms. For details visit [email or web address].   Also download the MyChart app! Go to the app store, search MyChart, open the app, select [place], and log in with your MyChart username and password.

## 2023-05-01 ENCOUNTER — Other Ambulatory Visit: Payer: Self-pay

## 2023-05-02 ENCOUNTER — Inpatient Hospital Stay: Payer: Medicare HMO | Attending: Hematology & Oncology

## 2023-05-02 ENCOUNTER — Ambulatory Visit: Payer: Medicare HMO | Admitting: Radiation Oncology

## 2023-05-02 VITALS — BP 120/64 | HR 80 | Temp 98.1°F | Resp 18

## 2023-05-02 DIAGNOSIS — C20 Malignant neoplasm of rectum: Secondary | ICD-10-CM | POA: Diagnosis present

## 2023-05-02 DIAGNOSIS — Z7901 Long term (current) use of anticoagulants: Secondary | ICD-10-CM | POA: Insufficient documentation

## 2023-05-02 DIAGNOSIS — Z5112 Encounter for antineoplastic immunotherapy: Secondary | ICD-10-CM | POA: Insufficient documentation

## 2023-05-02 DIAGNOSIS — Z8673 Personal history of transient ischemic attack (TIA), and cerebral infarction without residual deficits: Secondary | ICD-10-CM | POA: Insufficient documentation

## 2023-05-02 DIAGNOSIS — Z5111 Encounter for antineoplastic chemotherapy: Secondary | ICD-10-CM | POA: Insufficient documentation

## 2023-05-02 DIAGNOSIS — Z7982 Long term (current) use of aspirin: Secondary | ICD-10-CM | POA: Diagnosis not present

## 2023-05-02 DIAGNOSIS — Z452 Encounter for adjustment and management of vascular access device: Secondary | ICD-10-CM | POA: Insufficient documentation

## 2023-05-02 DIAGNOSIS — I4891 Unspecified atrial fibrillation: Secondary | ICD-10-CM | POA: Insufficient documentation

## 2023-05-02 MED ORDER — SODIUM CHLORIDE 0.9% FLUSH
10.0000 mL | INTRAVENOUS | Status: DC | PRN
  Administered 2023-05-02: 10 mL

## 2023-05-02 MED ORDER — HEPARIN SOD (PORK) LOCK FLUSH 100 UNIT/ML IV SOLN
500.0000 [IU] | Freq: Once | INTRAVENOUS | Status: AC | PRN
  Administered 2023-05-02: 500 [IU]

## 2023-05-02 NOTE — Patient Instructions (Signed)

## 2023-05-06 ENCOUNTER — Encounter: Payer: Self-pay | Admitting: Internal Medicine

## 2023-05-06 ENCOUNTER — Ambulatory Visit: Payer: Medicare HMO | Attending: Internal Medicine | Admitting: Internal Medicine

## 2023-05-06 VITALS — BP 123/80 | HR 111 | Ht 70.0 in | Wt 208.4 lb

## 2023-05-06 DIAGNOSIS — I2510 Atherosclerotic heart disease of native coronary artery without angina pectoris: Secondary | ICD-10-CM | POA: Diagnosis not present

## 2023-05-06 DIAGNOSIS — I4819 Other persistent atrial fibrillation: Secondary | ICD-10-CM | POA: Diagnosis not present

## 2023-05-06 DIAGNOSIS — Z789 Other specified health status: Secondary | ICD-10-CM | POA: Diagnosis not present

## 2023-05-06 DIAGNOSIS — E785 Hyperlipidemia, unspecified: Secondary | ICD-10-CM

## 2023-05-06 DIAGNOSIS — C20 Malignant neoplasm of rectum: Secondary | ICD-10-CM

## 2023-05-06 NOTE — Progress Notes (Signed)
 Cardiology Office Note:  .   Date:  05/07/2023  ID:  John Douglas, DOB [birth year], MRN [id number] PCP: Stephanie Charlene CROME, MD  [facility] Providers Cardiologist:  Lonni Hanson, MD     History of Present Illness: .   John Douglas is a 68 y.o. male with history of CAD with inferior STEMI and primary PCI to the RCA in 03/2019 complicated by sustained ventricular tachycardia in the setting of residual left coronary artery disease status post subsequent PCI to the mid LAD (06/2019), hypertension, hyperlipidemia, persistent atrial fibrillation complicated by stroke in 09/2019, ischemic cardiomyopathy, type 2 diabetes mellitus, and recurrent rectal cancer, who presents for follow-up of coronary artery disease and atrial fibrillation.  I last saw him in September, which time he complained of significant back pain for 2 weeks.  He was referred for CT scan given history of rectal cancer, which showed findings consistent with recurrence.  He subsequently has followed up with Dr. Timmy and was treated with XRT to L5 and continue to go undergo chemotherapy.  He recently received Xgeva  infusion and did not tolerate this well.  From a heart standpoint, John Douglas is without complaints, denying chest pain, shortness of breath, palpitations, and lightheadedness.  He notes minimal back pain, much better than at our prior visit.  He continues to have some imbalance, dating back to his stroke in 2021.  He has not fallen.  He denies bleeding.  He is scheduled for follow-up PET/CT scan with Dr. Timmy next week to assess response of his recurrent rectal cancer to chemotherapy.  ROS: See HPI  Studies Reviewed: SABRA   EKG Interpretation Date/Time:  Monday May 06 2023 16:27:49 EST Ventricular Rate:  111 PR Interval:    QRS Duration:  100 QT Interval:  300 QTC Calculation: 408 R Axis:   -15  Text Interpretation: Atrial fibrillation with rapid ventricular response Inferior infarct (cited on or before  10-Jan-2023) ST & T wave abnormality, consider lateral ischemia When compared with ECG of 10-Jan-2023 16:40, Vent. rate has increased Premature ventricular complexes are no longer Present Confirmed by Aldine Grainger, Lonni [phone]) on 05/07/2023 2:08:17 PM    Risk Assessment/Calculations:    CHA2DS2-VASc Score = 6   This indicates a 9.7% annual risk of stroke. The patient's score is based upon: CHF History: 0 HTN History: 1 Diabetes History: 1 Stroke History: 2 Vascular Disease History: 1 Age Score: 1 Gender Score: 0            Physical Exam:   VS:  BP 123/80 (BP Location: Left Arm, Patient Position: Sitting, Cuff Size: Normal)   Pulse (!) 111   Ht 5' 10 (1.778 m)   Wt 208 lb 6.4 oz (94.5 kg)   SpO2 99%   BMI 29.90 kg/m    Wt Readings from Last 3 Encounters:  05/06/23 208 lb 6.4 oz (94.5 kg)  04/30/23 209 lb (94.8 kg)  04/16/23 208 lb (94.3 kg)    General:  NAD. Neck: No JVD or HJR. Lungs: Clear to auscultation bilaterally without wheezes or crackles. Heart: Tachycardic and irregularly irregular rhythm without murmurs. Abdomen: Soft, nontender, nondistended. Extremities: No lower extremity edema.  ASSESSMENT AND PLAN: .    Persistent atrial fibrillation: Ventricular rates mildly elevated today, though in the setting of ongoing cancer treatment and recent reaction to Xgeva , I will defer escalation of his rate controlling agents.  Heart rates have typically been well-controlled at other visits, as recently as last week with Dr. Timmy.  As long  as he does not have any bleeding issues, we will continue stroke prevention with apixaban .  Coronary artery disease and hyperlipidemia with statin intolerance: No angina reported.  EKG shows previously noted lateral ST/T changes, that are slightly more pronounced (potentially in the setting of elevated ventricular rates).  Inferior infarct seen again as well.  Given the lack of symptoms and recurrent cancer, we will defer repeating ischemia  evaluation.  Continue ezetimibe  and lieu of a statin given statin intolerance in the past.  Defer challenge with PCSK9 inhibitor despite suboptimal LDL of 104 on the last check given his other comorbidities.  Recurrent rectal cancer: Continue close follow-up with Dr. Timmy.  Follow-up PET/CT scheduled for next week to assess response to chemotherapy.    Dispo: Return to clinic in 6 weeks.  Signed, Lonni Hanson, MD

## 2023-05-06 NOTE — Patient Instructions (Signed)
 Medication Instructions:  Your physician recommends that you continue on your current medications as directed. Please refer to the Current Medication list given to you today.   *If you need a refill on your cardiac medications before your next appointment, please call your pharmacy*   Lab Work: No labs ordered today    Testing/Procedures: No test ordered today    Follow-Up: At [facility], you and your health needs are our priority.  As part of our continuing mission to provide you with exceptional heart care, we have created designated Provider Care Teams.  These Care Teams include your primary Cardiologist (physician) and Advanced Practice Providers (APPs -  Physician Assistants and Nurse Practitioners) who all work together to provide you with the care you need, when you need it.  We recommend signing up for the patient portal called MyChart.  Sign up information is provided on this After Visit Summary.  MyChart is used to connect with patients for Virtual Visits (Telemedicine).  Patients are able to view lab/test results, encounter notes, upcoming appointments, etc.  Non-urgent messages can be sent to your provider as well.   To learn more about what you can do with MyChart, go to [email or web address].    Your next appointment:   6 week(s)  Provider:   You may see Lonni Hanson, MD or one of the following Advanced Practice Providers on your designated Care Team:   Lonni Meager, NP Bernardino Bring, PA-C Cadence Franchester, PA-C Tylene Lunch, NP Barnie Hila, NP

## 2023-05-07 ENCOUNTER — Encounter: Payer: Self-pay | Admitting: Internal Medicine

## 2023-05-07 ENCOUNTER — Other Ambulatory Visit: Payer: Self-pay

## 2023-05-13 ENCOUNTER — Encounter (HOSPITAL_COMMUNITY)
Admission: RE | Admit: 2023-05-13 | Discharge: 2023-05-13 | Disposition: A | Discharge status: Home / Self Care | Payer: Medicare HMO | Source: Ambulatory Visit | Attending: Hematology & Oncology | Admitting: Hematology & Oncology

## 2023-05-13 ENCOUNTER — Encounter: Payer: Self-pay | Admitting: *Deleted

## 2023-05-13 DIAGNOSIS — C20 Malignant neoplasm of rectum: Secondary | ICD-10-CM | POA: Insufficient documentation

## 2023-05-13 LAB — GLUCOSE, CAPILLARY
Glucose-Capillary: 176 mg/dL — ABNORMAL HIGH (ref 70–99)
Glucose-Capillary: 187 mg/dL — ABNORMAL HIGH (ref 70–99)

## 2023-05-13 MED ORDER — FLUDEOXYGLUCOSE F - 18 (FDG) INJECTION
10.0000 | Freq: Once | INTRAVENOUS | Status: AC | PRN
  Administered 2023-05-13: 10.75 via INTRAVENOUS

## 2023-05-21 ENCOUNTER — Other Ambulatory Visit: Payer: Medicare HMO

## 2023-05-21 ENCOUNTER — Ambulatory Visit: Payer: Medicare HMO

## 2023-05-21 ENCOUNTER — Inpatient Hospital Stay: Payer: Medicare HMO

## 2023-05-21 ENCOUNTER — Ambulatory Visit: Payer: Medicare HMO | Admitting: Hematology & Oncology

## 2023-05-22 ENCOUNTER — Inpatient Hospital Stay: Payer: Medicare HMO

## 2023-05-22 ENCOUNTER — Inpatient Hospital Stay: Payer: Medicare HMO | Admitting: Hematology & Oncology

## 2023-05-22 DIAGNOSIS — C20 Malignant neoplasm of rectum: Secondary | ICD-10-CM

## 2023-05-24 ENCOUNTER — Inpatient Hospital Stay: Payer: Medicare HMO

## 2023-05-28 ENCOUNTER — Inpatient Hospital Stay: Payer: Medicare HMO

## 2023-05-28 ENCOUNTER — Encounter: Payer: Self-pay | Admitting: Family

## 2023-05-28 ENCOUNTER — Inpatient Hospital Stay: Payer: Medicare HMO | Admitting: Family

## 2023-05-28 ENCOUNTER — Other Ambulatory Visit: Payer: Self-pay

## 2023-05-28 VITALS — BP 134/82 | HR 81 | Temp 98.1°F | Resp 18 | Ht 70.0 in | Wt 213.8 lb

## 2023-05-28 DIAGNOSIS — C20 Malignant neoplasm of rectum: Secondary | ICD-10-CM

## 2023-05-28 DIAGNOSIS — Z5112 Encounter for antineoplastic immunotherapy: Secondary | ICD-10-CM | POA: Diagnosis not present

## 2023-05-28 LAB — LACTATE DEHYDROGENASE: LDH: 243 U/L — ABNORMAL HIGH (ref 98–192)

## 2023-05-28 LAB — CMP (CANCER CENTER ONLY)
ALT: 12 U/L (ref 0–44)
AST: 13 U/L — ABNORMAL LOW (ref 15–41)
Albumin: 4.1 g/dL (ref 3.5–5.0)
Alkaline Phosphatase: 62 U/L (ref 38–126)
Anion gap: 8 (ref 5–15)
BUN: 16 mg/dL (ref 8–23)
CO2: 27 mmol/L (ref 22–32)
Calcium: 8.8 mg/dL — ABNORMAL LOW (ref 8.9–10.3)
Chloride: 104 mmol/L (ref 98–111)
Creatinine: 0.81 mg/dL (ref 0.61–1.24)
GFR, Estimated: >60 mL/min (ref >60)
Glucose, Bld: 230 mg/dL — ABNORMAL HIGH (ref 70–99)
Potassium: 3.5 mmol/L (ref 3.5–5.1)
Sodium: 139 mmol/L (ref 135–145)
Total Bilirubin: 0.8 mg/dL (ref 0.0–1.2)
Total Protein: 6.3 g/dL — ABNORMAL LOW (ref 6.5–8.1)

## 2023-05-28 LAB — CBC WITH DIFFERENTIAL (CANCER CENTER ONLY)
Abs Immature Granulocytes: 0.18 10*3/uL — ABNORMAL HIGH (ref 0.00–0.07)
Basophils Absolute: 0.1 10*3/uL (ref 0.0–0.1)
Basophils Relative: 1 %
Eosinophils Absolute: 0.1 10*3/uL (ref 0.0–0.5)
Eosinophils Relative: 1 %
HCT: 31.2 % — ABNORMAL LOW (ref 39.0–52.0)
Hemoglobin: 10.5 g/dL — ABNORMAL LOW (ref 13.0–17.0)
Immature Granulocytes: 2 %
Lymphocytes Relative: 9 %
Lymphs Abs: 1 10*3/uL (ref 0.7–4.0)
MCH: 34.7 pg — ABNORMAL HIGH (ref 26.0–34.0)
MCHC: 33.7 g/dL (ref 30.0–36.0)
MCV: 103 fL — ABNORMAL HIGH (ref 80.0–100.0)
Monocytes Absolute: 0.7 10*3/uL (ref 0.1–1.0)
Monocytes Relative: 7 %
Neutro Abs: 9 10*3/uL — ABNORMAL HIGH (ref 1.7–7.7)
Neutrophils Relative %: 80 %
Platelet Count: 259 10*3/uL (ref 150–400)
RBC: 3.03 MIL/uL — ABNORMAL LOW (ref 4.22–5.81)
RDW: 20.2 % — ABNORMAL HIGH (ref 11.5–15.5)
WBC Count: 11 10*3/uL — ABNORMAL HIGH (ref 4.0–10.5)
nRBC: 0.2 % (ref 0.0–0.2)

## 2023-05-28 LAB — CEA (ACCESS): CEA (CHCC): 2.34 ng/mL (ref 0.00–5.00)

## 2023-05-28 MED ORDER — LEUCOVORIN CALCIUM INJECTION 350 MG
400.0000 mg/m2 | Freq: Once | INTRAVENOUS | Status: AC
  Administered 2023-05-28: 872 mg via INTRAVENOUS
  Filled 2023-05-28: qty 43.6

## 2023-05-28 MED ORDER — DEXTROSE 5 % IV SOLN: INTRAVENOUS | Status: DC

## 2023-05-28 MED ORDER — PALONOSETRON HCL INJECTION 0.25 MG/5ML
0.2500 mg | Freq: Once | INTRAVENOUS | Status: AC
Start: 2023-05-28 — End: 2023-05-28
  Administered 2023-05-28: 0.25 mg via INTRAVENOUS
  Filled 2023-05-28: qty 5

## 2023-05-28 MED ORDER — FLUOROURACIL CHEMO INJECTION 2.5 GM/50ML
400.0000 mg/m2 | Freq: Once | INTRAVENOUS | Status: AC
  Administered 2023-05-28: 850 mg via INTRAVENOUS
  Filled 2023-05-28: qty 17

## 2023-05-28 MED ORDER — DEXAMETHASONE SODIUM PHOSPHATE 10 MG/ML IJ SOLN
10.0000 mg | Freq: Once | INTRAMUSCULAR | Status: AC
Start: 2023-05-28 — End: 2023-05-28
  Administered 2023-05-28: 10 mg via INTRAVENOUS
  Filled 2023-05-28: qty 1

## 2023-05-28 MED ORDER — SODIUM CHLORIDE 0.9 % IV SOLN
2400.0000 mg/m2 | INTRAVENOUS | Status: DC
  Administered 2023-05-28: 5000 mg via INTRAVENOUS
  Filled 2023-05-28: qty 100

## 2023-05-28 MED ORDER — OXALIPLATIN CHEMO INJECTION 100 MG/20ML
85.0000 mg/m2 | Freq: Once | INTRAVENOUS | Status: AC
  Administered 2023-05-28: 200 mg via INTRAVENOUS
  Filled 2023-05-28: qty 40

## 2023-05-28 MED ORDER — SODIUM CHLORIDE 0.9 % IV SOLN
6.0000 mg/kg | Freq: Once | INTRAVENOUS | Status: AC
  Administered 2023-05-28: 600 mg via INTRAVENOUS
  Filled 2023-05-28: qty 20

## 2023-05-28 MED ORDER — SODIUM CHLORIDE 0.9 % IV SOLN: INTRAVENOUS | Status: DC

## 2023-05-28 NOTE — Patient Instructions (Addendum)
[facility] - A DEPT OF [facility]  Discharge Instructions: Thank you for choosing [facility] to provide your oncology and hematology care.   If you have a lab appointment with the Cancer Center, please go directly to the Cancer Center and check in at the registration area.  Wear comfortable clothing and clothing appropriate for easy access to any Portacath or PICC line.   We strive to give you quality time with your provider. You may need to reschedule your appointment if you arrive late (15 or more minutes).  Arriving late affects you and other patients whose appointments are after yours.  Also, if you miss three or more appointments without notifying the office, you may be dismissed from the clinic at the provider's discretion.      For prescription refill requests, have your pharmacy contact our office and allow 72 hours for refills to be completed.    Today you received the following chemotherapy and/or immunotherapy agents FOLFOX, Vectibix    To help prevent nausea and vomiting after your treatment, we encourage you to take your nausea medication as directed.  BELOW ARE SYMPTOMS THAT SHOULD BE REPORTED IMMEDIATELY: *FEVER GREATER THAN 100.4 F (38 C) OR HIGHER *CHILLS OR SWEATING *NAUSEA AND VOMITING THAT IS NOT CONTROLLED WITH YOUR NAUSEA MEDICATION *UNUSUAL SHORTNESS OF BREATH *UNUSUAL BRUISING OR BLEEDING *URINARY PROBLEMS (pain or burning when urinating, or frequent urination) *BOWEL PROBLEMS (unusual diarrhea, constipation, pain near the anus) TENDERNESS IN MOUTH AND THROAT WITH OR WITHOUT PRESENCE OF ULCERS (sore throat, sores in mouth, or a toothache) UNUSUAL RASH, SWELLING OR PAIN  UNUSUAL VAGINAL DISCHARGE OR ITCHING   Items with * indicate a potential emergency and should be followed up as soon as possible or go to the Emergency Department if any problems should occur.  Please show the CHEMOTHERAPY ALERT CARD or  IMMUNOTHERAPY ALERT CARD at check-in to the Emergency Department and triage nurse. Should you have questions after your visit or need to cancel or reschedule your appointment, please contact [facility] - A DEPT OF [facility]  [phone] and follow the prompts.  Office hours are 8:00 a.m. to 4:30 p.m. Monday - Friday. Please note that voicemails left after 4:00 p.m. may not be returned until the following business day.  We are closed weekends and major holidays. You have access to a nurse at all times for urgent questions. Please call the main number to the clinic [phone] and follow the prompts.  For any non-urgent questions, you may also contact your provider using MyChart. We now offer e-Visits for anyone 9 and older to request care online for non-urgent symptoms. For details visit [email or web address].   Also download the MyChart app! Go to the app store, search "MyChart", open the app, select [place], and log in with your MyChart username and password.

## 2023-05-28 NOTE — Patient Instructions (Signed)
Implanted [facility] Guide An implanted port is a device that is placed under the skin. It is usually placed in the chest. The device may vary based on the need. Implanted ports can be used to give IV medicine, to take blood, or to give fluids. You may have an implanted port if: You need IV medicine that would be irritating to the small veins in your hands or arms. You need IV medicines, such as chemotherapy, for a long period of time. You need IV nutrition for a long period of time. You may have fewer limitations when using a port than you would if you used other types of long-term IVs. You will also likely be able to return to normal activities after your incision heals. An implanted port has two main parts: Reservoir. The reservoir is the part where a needle is inserted to give medicines or draw blood. The reservoir is round. After the port is placed, it appears as a small, raised area under your skin. Catheter. The catheter is a small, thin tube that connects the reservoir to a vein. Medicine that is inserted into the reservoir goes into the catheter and then into the vein. How is my port accessed? To access your port: A numbing cream may be placed on the skin over the port site. Your health care provider will put on a mask and sterile gloves. The skin over your port will be cleaned carefully with a germ-killing soap and allowed to dry. Your health care provider will gently pinch the port and insert a needle into it. Your health care provider will check for a blood return to make sure the port is in the vein and is still working (patent). If your port needs to remain accessed to get medicine continuously (constant infusion), your health care provider will place a clear bandage (dressing) over the needle site. The dressing and needle will need to be changed every week, or as told by your health care provider. What is flushing? Flushing helps keep the port working. Follow instructions from your  health care provider about how and when to flush the port. Ports are usually flushed with saline solution or a medicine called heparin. The need for flushing will depend on how the port is used: If the port is only used from time to time to give medicines or draw blood, the port may need to be flushed: Before and after medicines have been given. Before and after blood has been drawn. As part of routine maintenance. Flushing may be recommended every 4-6 weeks. If a constant infusion is running, the port may not need to be flushed. Throw away any syringes in a disposal container that is meant for sharp items (sharps container). You can buy a sharps container from a pharmacy, or you can make one by using an empty hard plastic bottle with a cover. How long will my port stay implanted? The port can stay in for as long as your health care provider thinks it is needed. When it is time for the port to come out, a surgery will be done to remove it. The surgery will be similar to the procedure that was done to put the port in. Follow these instructions at home: Caring for your port and port site Flush your port as told by your health care provider. If you need an infusion over several days, follow instructions from your health care provider about how to take care of your port site. Make sure you: Change your  dressing as told by your health care provider. Wash your hands with soap and water for at least 20 seconds before and after you change your dressing. If soap and water are not available, use alcohol-based hand sanitizer. Place any used dressings or infusion bags into a plastic bag. Throw that bag in the trash. Keep the dressing that covers the needle clean and dry. Do not get it wet. Do not use scissors or sharp objects near the infusion tubing. Keep any external tubes clamped, unless they are being used. Check your port site every day for signs of infection. Check for: Redness, swelling, or  pain. Fluid or blood. Warmth. Pus or a bad smell. Protect the skin around the port site. Avoid wearing bra straps that rub or irritate the site. Protect the skin around your port from seat belts. Place a soft pad over your chest if needed. Bathe or shower as told by your health care provider. The site may get wet as long as you are not actively receiving an infusion. General instructions  Return to your normal activities as told by your health care provider. Ask your health care provider what activities are safe for you. Carry a medical alert card or wear a medical alert bracelet at all times. This will let health care providers know that you have an implanted port in case of an emergency. Where to find more information American Cancer Society: www.cancer.org American Society of Clinical Oncology: www.cancer.net Contact a health care provider if: You have a fever or chills. You have redness, swelling, or pain at the port site. You have fluid or blood coming from your port site. Your incision feels warm to the touch. You have pus or a bad smell coming from the port site. Summary Implanted ports are usually placed in the chest for long-term IV access. Follow instructions from your health care provider about flushing the port and changing bandages (dressings). Take care of the area around your port by avoiding clothing that puts pressure on the area, and by watching for signs of infection. Protect the skin around your port from seat belts. Place a soft pad over your chest if needed. Contact a health care provider if you have a fever or you have redness, swelling, pain, fluid, or a bad smell at the port site. This information is not intended to replace advice given to you by your health care provider. Make sure you discuss any questions you have with your health care provider. Document Revised: 10/18/2020 Document Reviewed: 10/18/2020 Elsevier Patient Education  2024 [employer].

## 2023-05-28 NOTE — Addendum Note (Signed)
Addended by: Arlan Organ R on: 05/28/2023 10:51 AM   Modules accepted: Orders

## 2023-05-28 NOTE — Progress Notes (Signed)
Hematology and Oncology Follow Up Visit  Muneer Leider [id number] [birth year] 68 y.o. 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 twice daily Aspirin 81 mg p.o. daily FOLFOX/Vectibix --start on 03/07/2023; s/p cycle #3 Xgeva 120 mg subcu every 3 months-start 03/07/2023               Interim History:  Mr. Cortright is here today for follow-up and treatment. He is feeling a little sad after losing a close friend and mentor recently.  No c/o fever, chills, n/v, cough, rash, dizziness, SOB, chest pain, palpitations or changes in bowel or bladder habits.  His symptoms from the Xgeva have mostly resolved.  No blood loss noted. No bruising or petechiae.  No swelling, numbness or tingling in his extremities.  No falls or syncope.  Appetite and hydration are good. Weight is stable at 213 lbs.   ECOG Performance Status: 1 - Symptomatic but completely ambulatory  Medications:  Allergies as of 05/28/2023       Reactions   Celexa [citalopram] Other (See Comments)   Contraindicated with A-Fib   Ibuprofen Other (See Comments)   Was told to not take this    Metformin And Related Other (See Comments)   Bloody stools    Naproxen Other (See Comments)   Was told to not take this    Yellow Jacket Venom [bee Venom] Swelling, Other (See Comments)   Severe swelling where stung   Penicillins Hives   Did it involve swelling of the face/tongue/throat, SOB, or low BP? Unk Did it involve sudden or severe rash/hives, skin  peeling, or any reaction on the inside of your mouth or nose? Yes Did you need to seek medical attention at a hospital or doctor's office? Unk When did it last happen? "Childhood- 55 years ago" If all above answers are "NO", may proceed with cephalosporin use.   Xgeva [denosumab] Other (See Comments)   Runny and bloody nose, lower back pain, watery discharge from eyes, sinuses were raw feeling        Medication List        Accurate as of May 28, 2023 10:09 AM. If you have any questions, ask your nurse or doctor.          STOP taking these medications    doxycycline 100 MG tablet Commonly known as: VIBRA-TABS Stopped by: Eileen Stanford       TAKE these medications    diltiazem 240 MG 24 hr capsule Commonly known as: CARDIZEM CD TAKE 1 CAPSULE BY MOUTH EVERY DAY   Eliquis 5 MG Tabs tablet Generic drug: apixaban TAKE 1 TABLET BY MOUTH TWICE A DAY   ezetimibe 10 MG tablet Commonly known as: ZETIA Take 1 tablet by mouth once daily   fluticasone 50 MCG/ACT nasal spray Commonly known as: Flonase Place 1 spray into both nostrils 2 (two) times daily as needed (sinus congestion).   furosemide 40 MG tablet Commonly known as: LASIX TAKE 1 TABLET BY MOUTH EVERY DAY   glimepiride 1 MG tablet Commonly known as: AMARYL Take 1 mg by mouth daily.  HYDROcodone-acetaminophen 5-325 MG tablet Commonly known as: Norco Take 1 tablet by mouth every 6 (six) hours as needed for moderate pain (pain score 4-6).   Klor-Con M20 20 MEQ tablet Generic drug: potassium chloride SA TAKE 2 TABLETS BY MOUTH EVERY DAY   metoprolol succinate 50 MG 24 hr tablet Commonly known as: TOPROL-XL Take 1 tablet (50 mg total) by mouth daily.   nitroGLYCERIN 0.4 MG SL tablet Commonly known as: NITROSTAT Place 1 tablet (0.4 mg total) under the tongue every 5 (five) minutes as needed for chest pain.   ondansetron 8 MG tablet Commonly known as: Zofran Take 1 tablet (8 mg total) by mouth every 8  (eight) hours as needed for nausea or vomiting. Start on the third day after chemotherapy.   pramoxine-hydrocortisone 1-1 % rectal cream Commonly known as: PROCTOCREAM-HC Place 1 application  rectally 2 (two) times daily as needed for hemorrhoids or anal itching.   prochlorperazine 10 MG tablet Commonly known as: COMPAZINE Take 1 tablet (10 mg total) by mouth every 6 (six) hours as needed for nausea or vomiting.   sulfacetamide 10 % ophthalmic solution Commonly known as: Bleph-10 Place 1 drop into both eyes every 4 (four) hours.        Allergies:  Allergies  Allergen Reactions   Celexa [Citalopram] Other (See Comments)    Contraindicated with A-Fib   Ibuprofen Other (See Comments)    Was told to not take this    Metformin And Related Other (See Comments)    Bloody stools    Naproxen Other (See Comments)    Was told to not take this    Yellow Jacket Venom [Bee Venom] Swelling and Other (See Comments)    Severe swelling where stung   Penicillins Hives    Did it involve swelling of the face/tongue/throat, SOB, or low BP? Unk Did it involve sudden or severe rash/hives, skin peeling, or any reaction on the inside of your mouth or nose? Yes Did you need to seek medical attention at a hospital or doctor's office? Unk When did it last happen? "Childhood- 53 years ago" If all above answers are "NO", may proceed with cephalosporin use.    Rivka Barbara [Denosumab] Other (See Comments)    Runny and bloody nose, lower back pain, watery discharge from eyes, sinuses were raw feeling    Past Medical History, Surgical history, Social history, and Family History were reviewed and updated.  Review of Systems: All other 10 point review of systems is negative.   Physical Exam:  height is 5\' 10"  (1.778 m) and weight is 213 lb 12.8 oz (97 kg). His oral temperature is 98.1 F (36.7 C). His blood pressure is 134/82 and his pulse is 81. His respiration is 18 and oxygen saturation is 99%.   Wt  Readings from Last 3 Encounters:  05/28/23 213 lb 12.8 oz (97 kg)  05/06/23 208 lb 6.4 oz (94.5 kg)  04/30/23 209 lb (94.8 kg)    Ocular: Sclerae unicteric, pupils equal, round and reactive to light Ear-nose-throat: Oropharynx clear, dentition fair Lymphatic: No cervical or supraclavicular adenopathy Lungs no rales or rhonchi, good excursion bilaterally Heart regular rate and rhythm, no murmur appreciated Abd soft, nontender, positive bowel sounds MSK no focal spinal tenderness, no joint edema Neuro: non-focal, well-oriented, appropriate affect Breasts: Deferred   Lab Results  Component Value Date   WBC 3.6 (L) 04/30/2023   HGB 8.8 (L) 04/30/2023   HCT 25.7 (L) 04/30/2023   MCV 95.9 04/30/2023  PLT 133 (L) 04/30/2023   Lab Results  Component Value Date   FERRITIN 532 (H) 04/16/2023   IRON 80 04/16/2023   TIBC 283 04/16/2023   UIBC 203 04/16/2023   IRONPCTSAT 28 04/16/2023   Lab Results  Component Value Date   RETICCTPCT 5.4 (H) 08/15/2020   RBC 2.68 (L) 04/30/2023   No results found for: "KPAFRELGTCHN", "LAMBDASER", "KAPLAMBRATIO" No results found for: "IGGSERUM", "IGA", "IGMSERUM" No results found for: "TOTALPROTELP", "ALBUMINELP", "A1GS", "A2GS", "BETS", "BETA2SER", "GAMS", "MSPIKE", "SPEI"   Chemistry      Component Value Date/Time   NA 140 04/30/2023 0815   NA 140 08/28/2019 0940   K 3.6 04/30/2023 0815   CL 105 04/30/2023 0815   CO2 27 04/30/2023 0815   BUN 15 04/30/2023 0815   BUN 21 08/28/2019 0940   CREATININE 0.88 04/30/2023 0815      Component Value Date/Time   CALCIUM 8.7 (L) 04/30/2023 0815   ALKPHOS 56 04/30/2023 0815   AST 12 (L) 04/30/2023 0815   ALT 14 04/30/2023 0815   BILITOT 1.0 04/30/2023 0815       Impression and Plan: Mr. Ron is a pleasant 68 yo caucasian gentleman with history of CVA secondary to atrial fib and rectal bleeding. He was diagnosed with metastatic adenocarcinoma of the rectum -K-ras wild type/BRAF wild-type/HER-2  negative/MMR proficient. He had adenopathy distant to the rectum and now has disease recurrence.   He has finished radiation therapy.  Recent PET scan showed a nice response to treatment.  CEA pending. Most recent stable at 2.55.  We will proceed with treatment today as planned.  Follow-up in 2 weeks.   Eileen Stanford, NP 1/28/202510:09 AM

## 2023-05-30 ENCOUNTER — Inpatient Hospital Stay: Payer: Medicare HMO

## 2023-05-30 ENCOUNTER — Other Ambulatory Visit: Payer: Self-pay | Admitting: Internal Medicine

## 2023-05-30 VITALS — BP 116/74 | HR 74 | Temp 98.2°F | Resp 20

## 2023-05-30 DIAGNOSIS — Z5112 Encounter for antineoplastic immunotherapy: Secondary | ICD-10-CM | POA: Diagnosis not present

## 2023-05-30 DIAGNOSIS — C20 Malignant neoplasm of rectum: Secondary | ICD-10-CM

## 2023-05-30 MED ORDER — SODIUM CHLORIDE 0.9% FLUSH
10.0000 mL | INTRAVENOUS | Status: DC | PRN
  Administered 2023-05-30: 10 mL

## 2023-05-30 MED ORDER — HEPARIN SOD (PORK) LOCK FLUSH 100 UNIT/ML IV SOLN
500.0000 [IU] | Freq: Once | INTRAVENOUS | Status: AC | PRN
  Administered 2023-05-30: 500 [IU]

## 2023-05-30 NOTE — Patient Instructions (Signed)
Implanted [facility] Guide An implanted port is a device that is placed under the skin. It is usually placed in the chest. The device may vary based on the need. Implanted ports can be used to give IV medicine, to take blood, or to give fluids. You may have an implanted port if: You need IV medicine that would be irritating to the small veins in your hands or arms. You need IV medicines, such as chemotherapy, for a long period of time. You need IV nutrition for a long period of time. You may have fewer limitations when using a port than you would if you used other types of long-term IVs. You will also likely be able to return to normal activities after your incision heals. An implanted port has two main parts: Reservoir. The reservoir is the part where a needle is inserted to give medicines or draw blood. The reservoir is round. After the port is placed, it appears as a small, raised area under your skin. Catheter. The catheter is a small, thin tube that connects the reservoir to a vein. Medicine that is inserted into the reservoir goes into the catheter and then into the vein. How is my port accessed? To access your port: A numbing cream may be placed on the skin over the port site. Your health care provider will put on a mask and sterile gloves. The skin over your port will be cleaned carefully with a germ-killing soap and allowed to dry. Your health care provider will gently pinch the port and insert a needle into it. Your health care provider will check for a blood return to make sure the port is in the vein and is still working (patent). If your port needs to remain accessed to get medicine continuously (constant infusion), your health care provider will place a clear bandage (dressing) over the needle site. The dressing and needle will need to be changed every week, or as told by your health care provider. What is flushing? Flushing helps keep the port working. Follow instructions from your  health care provider about how and when to flush the port. Ports are usually flushed with saline solution or a medicine called heparin. The need for flushing will depend on how the port is used: If the port is only used from time to time to give medicines or draw blood, the port may need to be flushed: Before and after medicines have been given. Before and after blood has been drawn. As part of routine maintenance. Flushing may be recommended every 4-6 weeks. If a constant infusion is running, the port may not need to be flushed. Throw away any syringes in a disposal container that is meant for sharp items (sharps container). You can buy a sharps container from a pharmacy, or you can make one by using an empty hard plastic bottle with a cover. How long will my port stay implanted? The port can stay in for as long as your health care provider thinks it is needed. When it is time for the port to come out, a surgery will be done to remove it. The surgery will be similar to the procedure that was done to put the port in. Follow these instructions at home: Caring for your port and port site Flush your port as told by your health care provider. If you need an infusion over several days, follow instructions from your health care provider about how to take care of your port site. Make sure you: Change your  dressing as told by your health care provider. Wash your hands with soap and water for at least 20 seconds before and after you change your dressing. If soap and water are not available, use alcohol-based hand sanitizer. Place any used dressings or infusion bags into a plastic bag. Throw that bag in the trash. Keep the dressing that covers the needle clean and dry. Do not get it wet. Do not use scissors or sharp objects near the infusion tubing. Keep any external tubes clamped, unless they are being used. Check your port site every day for signs of infection. Check for: Redness, swelling, or  pain. Fluid or blood. Warmth. Pus or a bad smell. Protect the skin around the port site. Avoid wearing bra straps that rub or irritate the site. Protect the skin around your port from seat belts. Place a soft pad over your chest if needed. Bathe or shower as told by your health care provider. The site may get wet as long as you are not actively receiving an infusion. General instructions  Return to your normal activities as told by your health care provider. Ask your health care provider what activities are safe for you. Carry a medical alert card or wear a medical alert bracelet at all times. This will let health care providers know that you have an implanted port in case of an emergency. Where to find more information American Cancer Society: www.cancer.org American Society of Clinical Oncology: www.cancer.net Contact a health care provider if: You have a fever or chills. You have redness, swelling, or pain at the port site. You have fluid or blood coming from your port site. Your incision feels warm to the touch. You have pus or a bad smell coming from the port site. Summary Implanted ports are usually placed in the chest for long-term IV access. Follow instructions from your health care provider about flushing the port and changing bandages (dressings). Take care of the area around your port by avoiding clothing that puts pressure on the area, and by watching for signs of infection. Protect the skin around your port from seat belts. Place a soft pad over your chest if needed. Contact a health care provider if you have a fever or you have redness, swelling, pain, fluid, or a bad smell at the port site. This information is not intended to replace advice given to you by your health care provider. Make sure you discuss any questions you have with your health care provider. Document Revised: 10/18/2020 Document Reviewed: 10/18/2020 Elsevier Patient Education  2024 [employer].

## 2023-06-11 ENCOUNTER — Encounter: Payer: Self-pay | Admitting: Hematology & Oncology

## 2023-06-11 ENCOUNTER — Inpatient Hospital Stay: Payer: Medicare HMO | Admitting: Hematology & Oncology

## 2023-06-11 ENCOUNTER — Inpatient Hospital Stay: Payer: Medicare HMO | Attending: Hematology & Oncology

## 2023-06-11 ENCOUNTER — Inpatient Hospital Stay: Payer: Medicare HMO

## 2023-06-11 VITALS — BP 133/72 | HR 72 | Temp 98.1°F | Resp 18 | Ht 70.0 in | Wt 208.0 lb

## 2023-06-11 DIAGNOSIS — Z5111 Encounter for antineoplastic chemotherapy: Secondary | ICD-10-CM | POA: Insufficient documentation

## 2023-06-11 DIAGNOSIS — C20 Malignant neoplasm of rectum: Secondary | ICD-10-CM

## 2023-06-11 DIAGNOSIS — Z7901 Long term (current) use of anticoagulants: Secondary | ICD-10-CM | POA: Diagnosis not present

## 2023-06-11 DIAGNOSIS — G629 Polyneuropathy, unspecified: Secondary | ICD-10-CM | POA: Insufficient documentation

## 2023-06-11 DIAGNOSIS — Z5112 Encounter for antineoplastic immunotherapy: Secondary | ICD-10-CM | POA: Diagnosis present

## 2023-06-11 DIAGNOSIS — Z7982 Long term (current) use of aspirin: Secondary | ICD-10-CM | POA: Diagnosis not present

## 2023-06-11 DIAGNOSIS — K625 Hemorrhage of anus and rectum: Secondary | ICD-10-CM | POA: Insufficient documentation

## 2023-06-11 DIAGNOSIS — Z452 Encounter for adjustment and management of vascular access device: Secondary | ICD-10-CM | POA: Diagnosis not present

## 2023-06-11 DIAGNOSIS — I4891 Unspecified atrial fibrillation: Secondary | ICD-10-CM | POA: Insufficient documentation

## 2023-06-11 DIAGNOSIS — Z8673 Personal history of transient ischemic attack (TIA), and cerebral infarction without residual deficits: Secondary | ICD-10-CM | POA: Diagnosis not present

## 2023-06-11 LAB — CBC WITH DIFFERENTIAL (CANCER CENTER ONLY)
Abs Immature Granulocytes: 0.03 10*3/uL (ref 0.00–0.07)
Basophils Absolute: 0 10*3/uL (ref 0.0–0.1)
Basophils Relative: 0 %
Eosinophils Absolute: 0.1 10*3/uL (ref 0.0–0.5)
Eosinophils Relative: 2 %
HCT: 30.1 % — ABNORMAL LOW (ref 39.0–52.0)
Hemoglobin: 10.4 g/dL — ABNORMAL LOW (ref 13.0–17.0)
Immature Granulocytes: 1 %
Lymphocytes Relative: 13 %
Lymphs Abs: 0.9 10*3/uL (ref 0.7–4.0)
MCH: 35 pg — ABNORMAL HIGH (ref 26.0–34.0)
MCHC: 34.6 g/dL (ref 30.0–36.0)
MCV: 101.3 fL — ABNORMAL HIGH (ref 80.0–100.0)
Monocytes Absolute: 0.6 10*3/uL (ref 0.1–1.0)
Monocytes Relative: 10 %
Neutro Abs: 5 10*3/uL (ref 1.7–7.7)
Neutrophils Relative %: 74 %
Platelet Count: 140 10*3/uL — ABNORMAL LOW (ref 150–400)
RBC: 2.97 MIL/uL — ABNORMAL LOW (ref 4.22–5.81)
RDW: 19.4 % — ABNORMAL HIGH (ref 11.5–15.5)
WBC Count: 6.6 10*3/uL (ref 4.0–10.5)
nRBC: 0.3 % — ABNORMAL HIGH (ref 0.0–0.2)

## 2023-06-11 LAB — CMP (CANCER CENTER ONLY)
ALT: 12 U/L (ref 0–44)
AST: 13 U/L — ABNORMAL LOW (ref 15–41)
Albumin: 4 g/dL (ref 3.5–5.0)
Alkaline Phosphatase: 60 U/L (ref 38–126)
Anion gap: 8 (ref 5–15)
BUN: 14 mg/dL (ref 8–23)
CO2: 29 mmol/L (ref 22–32)
Calcium: 8.7 mg/dL — ABNORMAL LOW (ref 8.9–10.3)
Chloride: 102 mmol/L (ref 98–111)
Creatinine: 0.91 mg/dL (ref 0.61–1.24)
GFR, Estimated: >60 mL/min (ref >60)
Glucose, Bld: 215 mg/dL — ABNORMAL HIGH (ref 70–99)
Potassium: 3.4 mmol/L — ABNORMAL LOW (ref 3.5–5.1)
Sodium: 139 mmol/L (ref 135–145)
Total Bilirubin: 0.9 mg/dL (ref 0.0–1.2)
Total Protein: 6.2 g/dL — ABNORMAL LOW (ref 6.5–8.1)

## 2023-06-11 LAB — LACTATE DEHYDROGENASE: LDH: 203 U/L — ABNORMAL HIGH (ref 98–192)

## 2023-06-11 LAB — CEA (ACCESS): CEA (CHCC): 2.89 ng/mL (ref 0.00–5.00)

## 2023-06-11 MED ORDER — PANITUMUMAB CHEMO INJECTION 100 MG/5ML
6.0000 mg/kg | Freq: Once | INTRAVENOUS | Status: AC
  Administered 2023-06-11: 600 mg via INTRAVENOUS
  Filled 2023-06-11: qty 20

## 2023-06-11 MED ORDER — DEXTROSE 5 % IV SOLN: INTRAVENOUS | Status: DC

## 2023-06-11 MED ORDER — DOXYCYCLINE HYCLATE 100 MG PO TABS: 100.0000 mg | ORAL_TABLET | Freq: Two times a day (BID) | ORAL | 0 refills | Status: DC

## 2023-06-11 MED ORDER — DEXAMETHASONE SODIUM PHOSPHATE 10 MG/ML IJ SOLN
10.0000 mg | Freq: Once | INTRAMUSCULAR | Status: AC
  Administered 2023-06-11: 10 mg via INTRAVENOUS
  Filled 2023-06-11: qty 1

## 2023-06-11 MED ORDER — SODIUM CHLORIDE 0.9 % IV SOLN
INTRAVENOUS | Status: DC
Start: 2023-06-11 — End: 2023-06-11

## 2023-06-11 MED ORDER — HEPARIN SOD (PORK) LOCK FLUSH 100 UNIT/ML IV SOLN
500.0000 [IU] | Freq: Once | INTRAVENOUS | Status: DC | PRN
Start: 2023-06-11 — End: 2023-06-11

## 2023-06-11 MED ORDER — OXALIPLATIN CHEMO INJECTION 100 MG/20ML
85.0000 mg/m2 | Freq: Once | INTRAVENOUS | Status: AC
  Administered 2023-06-11: 200 mg via INTRAVENOUS
  Filled 2023-06-11: qty 40

## 2023-06-11 MED ORDER — LEUCOVORIN CALCIUM INJECTION 350 MG
400.0000 mg/m2 | Freq: Once | INTRAVENOUS | Status: AC
  Administered 2023-06-11: 872 mg via INTRAVENOUS
  Filled 2023-06-11: qty 43.6

## 2023-06-11 MED ORDER — MUPIROCIN 2 % EX OINT
1.0000 | TOPICAL_OINTMENT | Freq: Three times a day (TID) | CUTANEOUS | 0 refills | Status: DC
Start: 2023-06-11 — End: 2023-12-03

## 2023-06-11 MED ORDER — FLUOROURACIL CHEMO INJECTION 2.5 GM/50ML
400.0000 mg/m2 | Freq: Once | INTRAVENOUS | Status: AC
  Administered 2023-06-11: 850 mg via INTRAVENOUS
  Filled 2023-06-11: qty 17

## 2023-06-11 MED ORDER — SODIUM CHLORIDE 0.9% FLUSH: 10.0000 mL | INTRAVENOUS | Status: DC | PRN

## 2023-06-11 MED ORDER — PALONOSETRON HCL INJECTION 0.25 MG/5ML
0.2500 mg | Freq: Once | INTRAVENOUS | Status: AC
  Administered 2023-06-11: 0.25 mg via INTRAVENOUS
  Filled 2023-06-11: qty 5

## 2023-06-11 MED ORDER — SODIUM CHLORIDE 0.9 % IV SOLN
2400.0000 mg/m2 | INTRAVENOUS | Status: DC
  Administered 2023-06-11: 5000 mg via INTRAVENOUS
  Filled 2023-06-11: qty 100

## 2023-06-11 NOTE — Patient Instructions (Signed)
[facility] - A DEPT OF [facility]  Discharge Instructions: Thank you for choosing [facility] to provide your oncology and hematology care.   If you have a lab appointment with the Cancer Center, please go directly to the Cancer Center and check in at the registration area.  Wear comfortable clothing and clothing appropriate for easy access to any Portacath or PICC line.   We strive to give you quality time with your provider. You may need to reschedule your appointment if you arrive late (15 or more minutes).  Arriving late affects you and other patients whose appointments are after yours.  Also, if you miss three or more appointments without notifying the office, you may be dismissed from the clinic at the provider's discretion.      For prescription refill requests, have your pharmacy contact our office and allow 72 hours for refills to be completed.    Today you received the following chemotherapy and/or immunotherapy agents FOLFOX, Vectibix    To help prevent nausea and vomiting after your treatment, we encourage you to take your nausea medication as directed.  BELOW ARE SYMPTOMS THAT SHOULD BE REPORTED IMMEDIATELY: *FEVER GREATER THAN 100.4 F (38 C) OR HIGHER *CHILLS OR SWEATING *NAUSEA AND VOMITING THAT IS NOT CONTROLLED WITH YOUR NAUSEA MEDICATION *UNUSUAL SHORTNESS OF BREATH *UNUSUAL BRUISING OR BLEEDING *URINARY PROBLEMS (pain or burning when urinating, or frequent urination) *BOWEL PROBLEMS (unusual diarrhea, constipation, pain near the anus) TENDERNESS IN MOUTH AND THROAT WITH OR WITHOUT PRESENCE OF ULCERS (sore throat, sores in mouth, or a toothache) UNUSUAL RASH, SWELLING OR PAIN  UNUSUAL VAGINAL DISCHARGE OR ITCHING   Items with * indicate a potential emergency and should be followed up as soon as possible or go to the Emergency Department if any problems should occur.  Please show the CHEMOTHERAPY ALERT CARD or  IMMUNOTHERAPY ALERT CARD at check-in to the Emergency Department and triage nurse. Should you have questions after your visit or need to cancel or reschedule your appointment, please contact [facility] - A DEPT OF [facility]  [phone] and follow the prompts.  Office hours are 8:00 a.m. to 4:30 p.m. Monday - Friday. Please note that voicemails left after 4:00 p.m. may not be returned until the following business day.  We are closed weekends and major holidays. You have access to a nurse at all times for urgent questions. Please call the main number to the clinic [phone] and follow the prompts.  For any non-urgent questions, you may also contact your provider using MyChart. We now offer e-Visits for anyone 23 and older to request care online for non-urgent symptoms. For details visit [email or web address].   Also download the MyChart app! Go to the app store, search "MyChart", open the app, select [facility], and log in with your MyChart username and password.

## 2023-06-11 NOTE — Patient Instructions (Signed)

## 2023-06-11 NOTE — Progress Notes (Signed)
Hematology and Oncology Follow Up Visit  Moxon Messler [id number] 10/13/1955 68 y.o. 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 twice daily Aspirin 81 mg p.o. daily FOLFOX/Vectibix --start on 03/07/2023; s/p cycle #5 Xgeva 120 mg subcu every 3 months-start 03/07/2023 --patient declined any further Xgeva.               Interim History:  John Douglas is here today for follow-up and treatment.  He is doing pretty well.  His only complaint has been an infection that he developed around the fingernail of the fifth digit on his right hand.  I expressed a little bit of purulent material.  I cleaned it off.  I applied some topical antibiotic.  I will give him some doxycycline to take for 10 days and some Bactroban also to take.  He has done well.  His last PET scan looked very good.  He is responding.  His last CEA was 2.3.  He had Xgeva.  We will not give any more Xgeva.  He feels that the [place] may have caused a problem with the fingernail.  He said when he took the [place], he had so many side effects.  Otherwise, he seems to be managing pretty well.  He really has not had much in way of skin problems with the Vectibix.  He is eating well.  He is having no bleeding.  There is been no rashes.  He has had no leg swelling.  Overall, his performance status is probably ECOG 1.    Medications:  Allergies as of 06/11/2023       Reactions   Celexa [citalopram] Other (See Comments)   Contraindicated with A-Fib    Ibuprofen Other (See Comments)   Was told to not take this    Metformin And Related Other (See Comments)   Bloody stools    Naproxen Other (See Comments)   Was told to not take this    Yellow Jacket Venom [bee Venom] Swelling, Other (See Comments)   Severe swelling where stung   Penicillins Hives   Did it involve swelling of the face/tongue/throat, SOB, or low BP? Unk Did it involve sudden or severe rash/hives, skin peeling, or any reaction on the inside of your mouth or nose? Yes Did you need to seek medical attention at a hospital or doctor's office? Unk When did it last happen? "Childhood- 55 years ago" If all above answers are "NO", may proceed with cephalosporin use.   Xgeva [denosumab] Other (See Comments)   Runny and bloody nose, lower back pain, watery discharge from eyes, sinuses were raw feeling        Medication List        Accurate as of June 11, 2023 11:24 AM. If you have any questions, ask your nurse or doctor.          diltiazem 240 MG 24 hr capsule Commonly known as: CARDIZEM CD TAKE 1 CAPSULE BY MOUTH EVERY DAY   Eliquis 5 MG Tabs tablet Generic drug: apixaban TAKE 1 TABLET BY MOUTH TWICE A DAY  ezetimibe 10 MG tablet Commonly known as: ZETIA Take 1 tablet by mouth once daily   fluticasone 50 MCG/ACT nasal spray Commonly known as: Flonase Place 1 spray into both nostrils 2 (two) times daily as needed (sinus congestion).   furosemide 40 MG tablet Commonly known as: LASIX TAKE 1 TABLET BY MOUTH EVERY DAY   glimepiride 1 MG tablet Commonly known as: AMARYL Take 1 mg by mouth daily.   HYDROcodone-acetaminophen 5-325 MG tablet Commonly known as: Norco Take 1 tablet by mouth every 6 (six) hours as needed for moderate pain (pain score 4-6).   metoprolol succinate 50 MG 24 hr tablet Commonly known as: TOPROL-XL TAKE 1 TABLET BY MOUTH EVERY DAY   nitroGLYCERIN 0.4 MG SL tablet Commonly known as: NITROSTAT Place 1 tablet (0.4 mg total) under  the tongue every 5 (five) minutes as needed for chest pain.   ondansetron 8 MG tablet Commonly known as: Zofran Take 1 tablet (8 mg total) by mouth every 8 (eight) hours as needed for nausea or vomiting. Start on the third day after chemotherapy.   potassium chloride SA 20 MEQ tablet Commonly known as: Klor-Con M20 Take 2 tablets (40 mEq total) by mouth daily.   pramoxine-hydrocortisone 1-1 % rectal cream Commonly known as: PROCTOCREAM-HC Place 1 application  rectally 2 (two) times daily as needed for hemorrhoids or anal itching.   prochlorperazine 10 MG tablet Commonly known as: COMPAZINE Take 1 tablet (10 mg total) by mouth every 6 (six) hours as needed for nausea or vomiting.   sulfacetamide 10 % ophthalmic solution Commonly known as: Bleph-10 Place 1 drop into both eyes every 4 (four) hours.        Allergies:  Allergies  Allergen Reactions   Celexa [Citalopram] Other (See Comments)    Contraindicated with A-Fib   Ibuprofen Other (See Comments)    Was told to not take this    Metformin And Related Other (See Comments)    Bloody stools    Naproxen Other (See Comments)    Was told to not take this    Yellow Jacket Venom [Bee Venom] Swelling and Other (See Comments)    Severe swelling where stung   Penicillins Hives    Did it involve swelling of the face/tongue/throat, SOB, or low BP? Unk Did it involve sudden or severe rash/hives, skin peeling, or any reaction on the inside of your mouth or nose? Yes Did you need to seek medical attention at a hospital or doctor's office? Unk When did it last happen? "Childhood- 55 years ago" If all above answers are "NO", may proceed with cephalosporin use.    Rivka Barbara [Denosumab] Other (See Comments)    Runny and bloody nose, lower back pain, watery discharge from eyes, sinuses were raw feeling    Past Medical History, Surgical history, Social history, and Family History were reviewed and updated.  Review of Systems: All other 10  point review of systems is negative.   Physical Exam:  height is 5\' 10"  (1.778 m) and weight is 208 lb (94.3 kg). His oral temperature is 98.1 F (36.7 C). His blood pressure is 133/72 and his pulse is 72. His respiration is 18 and oxygen saturation is 100%.   Wt Readings from Last 3 Encounters:  06/11/23 208 lb (94.3 kg)  05/28/23 213 lb 12.8 oz (97 kg)  05/06/23 208 lb 6.4 oz (94.5 kg)    Ocular: Sclerae unicteric, pupils equal, round and reactive to light Ear-nose-throat: Oropharynx clear, dentition fair Lymphatic: No  cervical or supraclavicular adenopathy Lungs no rales or rhonchi, good excursion bilaterally Heart regular rate and rhythm, no murmur appreciated Abd soft, nontender, positive bowel sounds MSK no focal spinal tenderness, no joint edema Neuro: non-focal, well-oriented, appropriate affect Breasts: Deferred   Lab Results  Component Value Date   WBC 6.6 06/11/2023   HGB 10.4 (L) 06/11/2023   HCT 30.1 (L) 06/11/2023   MCV 101.3 (H) 06/11/2023   PLT 140 (L) 06/11/2023   Lab Results  Component Value Date   FERRITIN 532 (H) 04/16/2023   IRON 80 04/16/2023   TIBC 283 04/16/2023   UIBC 203 04/16/2023   IRONPCTSAT 28 04/16/2023   Lab Results  Component Value Date   RETICCTPCT 5.4 (H) 08/15/2020   RBC 2.97 (L) 06/11/2023   No results found for: "KPAFRELGTCHN", "LAMBDASER", "KAPLAMBRATIO" No results found for: "IGGSERUM", "IGA", "IGMSERUM" No results found for: "TOTALPROTELP", "ALBUMINELP", "A1GS", "A2GS", "BETS", "BETA2SER", "GAMS", "MSPIKE", "SPEI"   Chemistry      Component Value Date/Time   NA 139 06/11/2023 0938   NA 140 08/28/2019 0940   K 3.4 (L) 06/11/2023 0938   CL 102 06/11/2023 0938   CO2 29 06/11/2023 0938   BUN 14 06/11/2023 0938   BUN 21 08/28/2019 0940   CREATININE 0.91 06/11/2023 0938      Component Value Date/Time   CALCIUM 8.7 (L) 06/11/2023 0938   ALKPHOS 60 06/11/2023 0938   AST 13 (L) 06/11/2023 0938   ALT 12 06/11/2023 0938    BILITOT 0.9 06/11/2023 0938       Impression and Plan: Mr. Esguerra is a pleasant 68 yo caucasian gentleman with history of CVA secondary to atrial fib and rectal bleeding. He was diagnosed with metastatic adenocarcinoma of the rectum -K-ras wild type/BRAF wild-type/HER-2 negative/MMR proficient. He had adenopathy distant to the rectum and now has disease recurrence.    He has finished radiation therapy.   We will go ahead with his 6th cycle of chemotherapy.  After couple more cycles, we probably do another scan on him.  At some point, we will have to consider a maintenance protocol for him.  I will plan to see him back in another couple weeks. Marland Kitchen   Josph Macho, MD 2/11/202511:24 AM

## 2023-06-12 ENCOUNTER — Other Ambulatory Visit: Payer: Self-pay

## 2023-06-13 ENCOUNTER — Inpatient Hospital Stay: Payer: Medicare HMO

## 2023-06-13 VITALS — BP 130/73 | HR 60 | Temp 98.4°F | Resp 20

## 2023-06-13 DIAGNOSIS — Z5112 Encounter for antineoplastic immunotherapy: Secondary | ICD-10-CM | POA: Diagnosis not present

## 2023-06-13 DIAGNOSIS — C20 Malignant neoplasm of rectum: Secondary | ICD-10-CM

## 2023-06-13 MED ORDER — SODIUM CHLORIDE 0.9% FLUSH
10.0000 mL | INTRAVENOUS | Status: DC | PRN
  Administered 2023-06-13: 10 mL

## 2023-06-13 MED ORDER — HEPARIN SOD (PORK) LOCK FLUSH 100 UNIT/ML IV SOLN
500.0000 [IU] | Freq: Once | INTRAVENOUS | Status: AC | PRN
  Administered 2023-06-13: 500 [IU]

## 2023-06-24 ENCOUNTER — Ambulatory Visit: Payer: Medicare HMO | Attending: Internal Medicine | Admitting: Internal Medicine

## 2023-06-24 ENCOUNTER — Encounter: Payer: Self-pay | Admitting: Internal Medicine

## 2023-06-24 VITALS — BP 150/71 | HR 93 | Ht 70.0 in | Wt 209.8 lb

## 2023-06-24 DIAGNOSIS — E1159 Type 2 diabetes mellitus with other circulatory complications: Secondary | ICD-10-CM | POA: Diagnosis not present

## 2023-06-24 DIAGNOSIS — I2510 Atherosclerotic heart disease of native coronary artery without angina pectoris: Secondary | ICD-10-CM | POA: Diagnosis not present

## 2023-06-24 DIAGNOSIS — I4819 Other persistent atrial fibrillation: Secondary | ICD-10-CM | POA: Diagnosis not present

## 2023-06-24 DIAGNOSIS — I5032 Chronic diastolic (congestive) heart failure: Secondary | ICD-10-CM

## 2023-06-24 DIAGNOSIS — C20 Malignant neoplasm of rectum: Secondary | ICD-10-CM

## 2023-06-24 DIAGNOSIS — I152 Hypertension secondary to endocrine disorders: Secondary | ICD-10-CM

## 2023-06-24 NOTE — Patient Instructions (Signed)
 Medication Instructions:  Your physician recommends that you continue on your current medications as directed. Please refer to the Current Medication list given to you today.   *If you need a refill on your cardiac medications before your next appointment, please call your pharmacy*   Lab Work: No labs ordered today    Testing/Procedures: No test ordered today    Follow-Up: At [facility], you and your health needs are our priority.  As part of our continuing mission to provide you with exceptional heart care, we have created designated Provider Care Teams.  These Care Teams include your primary Cardiologist (physician) and Advanced Practice Providers (APPs -  Physician Assistants and Nurse Practitioners) who all work together to provide you with the care you need, when you need it.  We recommend signing up for the patient portal called "MyChart".  Sign up information is provided on this After Visit Summary.  MyChart is used to connect with patients for Virtual Visits (Telemedicine).  Patients are able to view lab/test results, encounter notes, upcoming appointments, etc.  Non-urgent messages can be sent to your provider as well.   To learn more about what you can do with MyChart, go to [email or web address].    Your next appointment:   6 month(s)  Provider:   You may see Yvonne Kendall, MD or one of the following Advanced Practice Providers on your designated Care Team:   Nicolasa Ducking, NP Eula Listen, PA-C Cadence Fransico Michael, PA-C Charlsie Quest, NP Carlos Levering, NP

## 2023-06-24 NOTE — Progress Notes (Unsigned)
 Cardiology Office Note:  .   Date:  06/26/2023  ID:  John Douglas, DOB [birth year], MRN [id number] PCP: Ailene Ravel, MD   HeartCare Providers Cardiologist:  Yvonne Kendall, MD     History of Present Illness: .   John Douglas is a 68 y.o. male with history of CAD with inferior STEMI and primary PCI to the RCA in 03/2019 complicated by sustained ventricular tachycardia in the setting of residual left coronary artery disease status post subsequent PCI to the mid LAD (06/2019), hypertension, hyperlipidemia, persistent atrial fibrillation complicated by stroke in 09/2019, ischemic cardiomyopathy, type 2 diabetes mellitus, and recurrent rectal cancer, who presents for follow-up of atrial fibrillation and coronary artery disease.  I last saw him about 6 weeks ago, at which time he was doing okay from a heart standpoint though he was noted to have somewhat elevated heart rates in the setting of persistent atrial fibrillation.  As he was undergoing chemotherapy and had just had a reaction to [place], we agreed to defer medication changes and continue his previous doses of diltiazem and metoprolol succinate for rate control.  Apixaban with has also been maintained for stroke prevention.  Today, John Douglas reports that he is feeling fairly well.  Most of the side effects from Xgeva have now resolved.  He continues to undergo chemotherapy but was encouraged by recent PET/CT and CEA results.  He denies chest pain, shortness of breath, palpitations, lightheadedness, and edema.  Home blood pressures are typically better than today's reading, usually around 130's/70's.  He remains on apixaban without bleeding.  ROS: See HPI  Studies Reviewed: Marland Kitchen   EKG Interpretation Date/Time:  Monday June 24 2023 16:21:57 EST Ventricular Rate:  92 PR Interval:    QRS Duration:  96 QT Interval:  374 QTC Calculation: 462 R Axis:   -15  Text Interpretation: Atrial fibrillation Inferior infarct (cited on or  before 10-Jan-2023) ST & T wave abnormality, consider anterolateral ischemia Abnormal ECG When compared with ECG of 06-May-2023 16:27, Vent. rate has decreased Confirmed by John Douglas [phone]) on 06/26/2023 7:07:38 AM    Risk Assessment/Calculations:    CHA2DS2-VASc Score = 7   This indicates a 11.2% annual risk of stroke. The patient's score is based upon: CHF History: 1 HTN History: 1 Diabetes History: 1 Stroke History: 2 Vascular Disease History: 1 Age Score: 1 Gender Score: 0     Physical Exam:   VS:  BP (!) 150/71 (BP Location: Left Arm, Patient Position: Sitting, Cuff Size: Normal)   Pulse 93   Ht 5\' 10"  (1.778 m)   Wt 209 lb 12.8 oz (95.2 kg)   SpO2 97%   BMI 30.10 kg/m    Wt Readings from Last 3 Encounters:  06/24/23 209 lb 12.8 oz (95.2 kg)  06/11/23 208 lb (94.3 kg)  05/28/23 213 lb 12.8 oz (97 kg)    General:  NAD. Neck: No JVD or HJR. Lungs: Clear to auscultation bilaterally without wheezes or crackles. Heart: Irregularly irregular rhythm without murmurs, rubs, or gallops. Abdomen: Soft, nontender, nondistended. Extremities: No lower extremity edema.  ASSESSMENT AND PLAN: .    Persistent atrial fibrillation: Ventricular rate control better today.  Continue indefinite anticoagulation with apixaban as long as bleeding complications do not develop, as well as rate control with diltiazem and metoprolol.  Coronary artery disease: No angina reported.  Continue secondary prevention with ezetimibe and apixaban and lieu of aspirin.  Patient previously intolerant of statins and does not wish to pursue additional  therapies like a PCSK9 inhibitor in light of his comorbidities.  Chronic HFpEF: John Douglas appears euvolemic on exam today.  Continue furosemide 40 mg daily.  Hypertension associated with type 2 diabetes mellitus: Blood pressure moderately elevated today but typically better at home and at cancer center visits.  Continue current doses of metoprolol and  diltiazem with continued home monitoring.  Metastatic rectal cancer: John Douglas continues to undergo chemotherapy with recent PET/CT and CEA levels encouraging that he is having a positive response.  Continue ongoing follow-up with Dr. Myna Hidalgo.    Dispo: Return to clinic in 6 months.  Signed, Yvonne Kendall, MD

## 2023-06-25 ENCOUNTER — Other Ambulatory Visit: Payer: Self-pay

## 2023-06-25 ENCOUNTER — Other Ambulatory Visit: Payer: Self-pay | Admitting: Family

## 2023-06-25 ENCOUNTER — Inpatient Hospital Stay: Payer: Medicare HMO

## 2023-06-25 ENCOUNTER — Inpatient Hospital Stay (HOSPITAL_BASED_OUTPATIENT_CLINIC_OR_DEPARTMENT_OTHER): Payer: Medicare HMO

## 2023-06-25 ENCOUNTER — Encounter: Payer: Self-pay | Admitting: Family

## 2023-06-25 ENCOUNTER — Inpatient Hospital Stay (HOSPITAL_BASED_OUTPATIENT_CLINIC_OR_DEPARTMENT_OTHER): Payer: Medicare HMO | Admitting: Family

## 2023-06-25 VITALS — BP 102/64 | HR 74 | Temp 98.5°F | Resp 18

## 2023-06-25 DIAGNOSIS — C20 Malignant neoplasm of rectum: Secondary | ICD-10-CM

## 2023-06-25 DIAGNOSIS — Z5112 Encounter for antineoplastic immunotherapy: Secondary | ICD-10-CM | POA: Diagnosis not present

## 2023-06-25 LAB — CMP (CANCER CENTER ONLY)
ALT: 17 U/L (ref 0–44)
AST: 16 U/L (ref 15–41)
Albumin: 3.9 g/dL (ref 3.5–5.0)
Alkaline Phosphatase: 62 U/L (ref 38–126)
Anion gap: 9 (ref 5–15)
BUN: 16 mg/dL (ref 8–23)
CO2: 26 mmol/L (ref 22–32)
Calcium: 8.9 mg/dL (ref 8.9–10.3)
Chloride: 102 mmol/L (ref 98–111)
Creatinine: 0.96 mg/dL (ref 0.61–1.24)
GFR, Estimated: >60 mL/min (ref >60)
Glucose, Bld: 304 mg/dL — ABNORMAL HIGH (ref 70–99)
Potassium: 3.8 mmol/L (ref 3.5–5.1)
Sodium: 137 mmol/L (ref 135–145)
Total Bilirubin: 0.9 mg/dL (ref 0.0–1.2)
Total Protein: 5.9 g/dL — ABNORMAL LOW (ref 6.5–8.1)

## 2023-06-25 LAB — CBC WITH DIFFERENTIAL (CANCER CENTER ONLY)
Abs Immature Granulocytes: 0.03 10*3/uL (ref 0.00–0.07)
Basophils Absolute: 0 10*3/uL (ref 0.0–0.1)
Basophils Relative: 0 %
Eosinophils Absolute: 0.1 10*3/uL (ref 0.0–0.5)
Eosinophils Relative: 1 %
HCT: 29.7 % — ABNORMAL LOW (ref 39.0–52.0)
Hemoglobin: 10.4 g/dL — ABNORMAL LOW (ref 13.0–17.0)
Immature Granulocytes: 1 %
Lymphocytes Relative: 13 %
Lymphs Abs: 0.8 10*3/uL (ref 0.7–4.0)
MCH: 36.1 pg — ABNORMAL HIGH (ref 26.0–34.0)
MCHC: 35 g/dL (ref 30.0–36.0)
MCV: 103.1 fL — ABNORMAL HIGH (ref 80.0–100.0)
Monocytes Absolute: 0.6 10*3/uL (ref 0.1–1.0)
Monocytes Relative: 10 %
Neutro Abs: 4.3 10*3/uL (ref 1.7–7.7)
Neutrophils Relative %: 75 %
Platelet Count: 125 10*3/uL — ABNORMAL LOW (ref 150–400)
RBC: 2.88 MIL/uL — ABNORMAL LOW (ref 4.22–5.81)
RDW: 19.8 % — ABNORMAL HIGH (ref 11.5–15.5)
WBC Count: 5.7 10*3/uL (ref 4.0–10.5)
nRBC: 1 % — ABNORMAL HIGH (ref 0.0–0.2)

## 2023-06-25 LAB — CEA (ACCESS): CEA (CHCC): 3.57 ng/mL (ref 0.00–5.00)

## 2023-06-25 LAB — MAGNESIUM: Magnesium: 1.5 mg/dL — ABNORMAL LOW (ref 1.7–2.4)

## 2023-06-25 MED ORDER — DEXAMETHASONE SODIUM PHOSPHATE 10 MG/ML IJ SOLN
10.0000 mg | Freq: Once | INTRAMUSCULAR | Status: AC
  Administered 2023-06-25: 10 mg via INTRAVENOUS
  Filled 2023-06-25: qty 1

## 2023-06-25 MED ORDER — PANITUMUMAB CHEMO INJECTION 100 MG/5ML
6.0000 mg/kg | Freq: Once | INTRAVENOUS | Status: AC
  Administered 2023-06-25: 600 mg via INTRAVENOUS
  Filled 2023-06-25: qty 20

## 2023-06-25 MED ORDER — SODIUM CHLORIDE 0.9 % IV SOLN: INTRAVENOUS | Status: DC

## 2023-06-25 MED ORDER — FLUOROURACIL CHEMO INJECTION 2.5 GM/50ML
400.0000 mg/m2 | Freq: Once | INTRAVENOUS | Status: AC
Start: 2023-06-25 — End: 2023-06-25
  Administered 2023-06-25: 850 mg via INTRAVENOUS
  Filled 2023-06-25: qty 17

## 2023-06-25 MED ORDER — DEXTROSE 5 % IV SOLN: INTRAVENOUS | Status: DC

## 2023-06-25 MED ORDER — LEUCOVORIN CALCIUM INJECTION 350 MG
400.0000 mg/m2 | Freq: Once | INTRAVENOUS | Status: AC
  Administered 2023-06-25: 872 mg via INTRAVENOUS
  Filled 2023-06-25: qty 43.6

## 2023-06-25 MED ORDER — OXALIPLATIN CHEMO INJECTION 100 MG/20ML
85.0000 mg/m2 | Freq: Once | INTRAVENOUS | Status: AC
  Administered 2023-06-25: 200 mg via INTRAVENOUS
  Filled 2023-06-25: qty 40

## 2023-06-25 MED ORDER — PALONOSETRON HCL INJECTION 0.25 MG/5ML
0.2500 mg | Freq: Once | INTRAVENOUS | Status: AC
  Administered 2023-06-25: 0.25 mg via INTRAVENOUS
  Filled 2023-06-25: qty 5

## 2023-06-25 MED ORDER — SODIUM CHLORIDE 0.9 % IV SOLN
2400.0000 mg/m2 | INTRAVENOUS | Status: DC
  Administered 2023-06-25: 5000 mg via INTRAVENOUS
  Filled 2023-06-25: qty 100

## 2023-06-25 NOTE — Progress Notes (Signed)
 Ok to proceed with treatment today without Mag level.  [name], [place], BCPS, BCOP 06/25/2023 10:52 AM

## 2023-06-25 NOTE — Patient Instructions (Signed)

## 2023-06-25 NOTE — Progress Notes (Signed)
 Hematology and Oncology Follow Up Visit  John Douglas [id number] [birth year] 68 y.o. 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 twice daily Aspirin 81 mg p.o. daily FOLFOX/Vectibix --start on 03/07/2023; s/p cycle 6 Xgeva 120 mg subcu every 3 months-start 03/07/2023 - patient declined any further Xgeva.                           Interim History:  John Douglas is here today for follow-up and treatment. He is doing fairly well. He states that he still has watery eyes and some scabs in his nose. No bleeding.  No itching or burning at this time. He notes that this occurs with treatment.  No fever, chills, n/v, cough, dizziness, SOB, chest pain, palpitations, abdominal pain or changes in bowel or bladder habits.  No swelling in his extremities.  Neuropathy with treatment unchanged from baseline.  No falls or syncope reported.  Appetite is fair and he is doing his best to stay well hydrated. Weight is stable at 212 lbs.   ECOG Performance Status: 1 - Symptomatic but completely ambulatory  Medications:  Allergies as of 06/25/2023       Reactions   Celexa [citalopram] Other (See Comments)   Contraindicated with A-Fib   Ibuprofen Other (See Comments)   Was told to not take this    Metformin And Related Other (See Comments)   Bloody stools    Naproxen Other (See Comments)   Was told to not take this    Yellow Jacket Venom [bee Venom] Swelling, Other (See Comments)   Severe swelling where stung    Penicillins Hives   Did it involve swelling of the face/tongue/throat, SOB, or low BP? Unk Did it involve sudden or severe rash/hives, skin peeling, or any reaction on the inside of your mouth or nose? Yes Did you need to seek medical attention at a hospital or doctor's office? Unk When did it last happen? "Childhood- 55 years ago" If all above answers are "NO", may proceed with cephalosporin use.   Xgeva [denosumab] Other (See Comments)   Runny and bloody nose, lower back pain, watery discharge from eyes, sinuses were raw feeling        Medication List        Accurate as of June 25, 2023  9:59 AM. If you have any questions, ask your nurse or doctor.          diltiazem 240 MG 24 hr capsule Commonly known as: CARDIZEM CD TAKE 1 CAPSULE BY MOUTH EVERY DAY   doxycycline 100 MG tablet Commonly known as: VIBRA-TABS Take 1 tablet (100 mg total) by mouth 2 (two) times daily.   Eliquis 5 MG Tabs tablet Generic drug: apixaban TAKE 1 TABLET BY MOUTH TWICE A DAY   ezetimibe 10 MG tablet Commonly known as: ZETIA Take 1 tablet by mouth once daily   fluticasone 50 MCG/ACT nasal spray Commonly known as: Flonase Place 1 spray into both nostrils 2 (two) times daily as needed (sinus congestion).   furosemide 40 MG tablet  Commonly known as: LASIX TAKE 1 TABLET BY MOUTH EVERY DAY   glimepiride 1 MG tablet Commonly known as: AMARYL Take 1 mg by mouth daily.   HYDROcodone-acetaminophen 5-325 MG tablet Commonly known as: Norco Take 1 tablet by mouth every 6 (six) hours as needed for moderate pain (pain score 4-6).   metoprolol succinate 50 MG 24 hr tablet Commonly known as: TOPROL-XL TAKE 1 TABLET BY MOUTH EVERY DAY   mupirocin ointment 2 % Commonly known as: BACTROBAN Apply 1 Application topically 3 (three) times daily. Apply to pinky that has the infection.   nitroGLYCERIN 0.4 MG SL tablet Commonly known as: NITROSTAT Place 1 tablet (0.4 mg total) under the tongue every  5 (five) minutes as needed for chest pain.   ondansetron 8 MG tablet Commonly known as: Zofran Take 1 tablet (8 mg total) by mouth every 8 (eight) hours as needed for nausea or vomiting. Start on the third day after chemotherapy.   potassium chloride SA 20 MEQ tablet Commonly known as: Klor-Con M20 Take 2 tablets (40 mEq total) by mouth daily.   pramoxine-hydrocortisone 1-1 % rectal cream Commonly known as: PROCTOCREAM-HC Place 1 application  rectally 2 (two) times daily as needed for hemorrhoids or anal itching.   prochlorperazine 10 MG tablet Commonly known as: COMPAZINE Take 1 tablet (10 mg total) by mouth every 6 (six) hours as needed for nausea or vomiting.   sulfacetamide 10 % ophthalmic solution Commonly known as: Bleph-10 Place 1 drop into both eyes every 4 (four) hours.        Allergies:  Allergies  Allergen Reactions   Celexa [Citalopram] Other (See Comments)    Contraindicated with A-Fib   Ibuprofen Other (See Comments)    Was told to not take this    Metformin And Related Other (See Comments)    Bloody stools    Naproxen Other (See Comments)    Was told to not take this    Yellow Jacket Venom [Bee Venom] Swelling and Other (See Comments)    Severe swelling where stung   Penicillins Hives    Did it involve swelling of the face/tongue/throat, SOB, or low BP? Unk Did it involve sudden or severe rash/hives, skin peeling, or any reaction on the inside of your mouth or nose? Yes Did you need to seek medical attention at a hospital or doctor's office? Unk When did it last happen? "Childhood- 55 years ago" If all above answers are "NO", may proceed with cephalosporin use.    Rivka Barbara [Denosumab] Other (See Comments)    Runny and bloody nose, lower back pain, watery discharge from eyes, sinuses were raw feeling    Past Medical History, Surgical history, Social history, and Family History were reviewed and updated.  Review of Systems: All other 10 point review of  systems is negative.   Physical Exam:  vitals were not taken for this visit.   Wt Readings from Last 3 Encounters:  06/24/23 209 lb 12.8 oz (95.2 kg)  06/11/23 208 lb (94.3 kg)  05/28/23 213 lb 12.8 oz (97 kg)    Ocular: Sclerae unicteric, pupils equal, round and reactive to light Ear-nose-throat: Oropharynx clear, dentition fair Lymphatic: No cervical or supraclavicular adenopathy Lungs no rales or rhonchi, good excursion bilaterally Heart regular rate and rhythm, no murmur appreciated Abd soft, nontender, positive bowel sounds MSK no focal spinal tenderness, no joint edema Neuro: non-focal, well-oriented, appropriate affect Breasts: Deferred   Lab Results  Component Value Date   WBC 6.6 06/11/2023  HGB 10.4 (L) 06/11/2023   HCT 30.1 (L) 06/11/2023   MCV 101.3 (H) 06/11/2023   PLT 140 (L) 06/11/2023   Lab Results  Component Value Date   FERRITIN 532 (H) 04/16/2023   IRON 80 04/16/2023   TIBC 283 04/16/2023   UIBC 203 04/16/2023   IRONPCTSAT 28 04/16/2023   Lab Results  Component Value Date   RETICCTPCT 5.4 (H) 08/15/2020   RBC 2.97 (L) 06/11/2023   No results found for: "KPAFRELGTCHN", "LAMBDASER", "KAPLAMBRATIO" No results found for: "IGGSERUM", "IGA", "IGMSERUM" No results found for: "TOTALPROTELP", "ALBUMINELP", "A1GS", "A2GS", "BETS", "BETA2SER", "GAMS", "MSPIKE", "SPEI"   Chemistry      Component Value Date/Time   NA 139 06/11/2023 0938   NA 140 08/28/2019 0940   K 3.4 (L) 06/11/2023 0938   CL 102 06/11/2023 0938   CO2 29 06/11/2023 0938   BUN 14 06/11/2023 0938   BUN 21 08/28/2019 0940   CREATININE 0.91 06/11/2023 0938      Component Value Date/Time   CALCIUM 8.7 (L) 06/11/2023 0938   ALKPHOS 60 06/11/2023 0938   AST 13 (L) 06/11/2023 0938   ALT 12 06/11/2023 0938   BILITOT 0.9 06/11/2023 0938       Impression and Plan: John Douglas is a pleasant 68 yo caucasian gentleman with history of CVA secondary to atrial fib and rectal bleeding. He was  diagnosed with metastatic adenocarcinoma of the rectum -K-ras wild type/BRAF wild-type/HER-2 negative/MMR proficient. He had adenopathy distant to the rectum and now has disease recurrence.   He has finished radiation therapy.  We will proceed with cycle 7 of FOLFOX today as planned per MD.  CEA has remained stable at 2.89. Today's result is pending.   We will look at repeat scans after his next cycle.  Follow-up in 2 weeks.   Eileen Stanford, NP 2/25/20259:59 AM

## 2023-06-25 NOTE — Patient Instructions (Signed)
 [facility] - A DEPT OF [facility]  Discharge Instructions: Thank you for choosing [facility] to provide your oncology and hematology care.   If you have a lab appointment with the Cancer Center, please go directly to the Cancer Center and check in at the registration area.  Wear comfortable clothing and clothing appropriate for easy access to any Portacath or PICC line.   We strive to give you quality time with your provider. You may need to reschedule your appointment if you arrive late (15 or more minutes).  Arriving late affects you and other patients whose appointments are after yours.  Also, if you miss three or more appointments without notifying the office, you may be dismissed from the clinic at the provider's discretion.      For prescription refill requests, have your pharmacy contact our office and allow 72 hours for refills to be completed.    Today you received the following chemotherapy and/or immunotherapy agents FOLFOX, Vectibix    To help prevent nausea and vomiting after your treatment, we encourage you to take your nausea medication as directed.  BELOW ARE SYMPTOMS THAT SHOULD BE REPORTED IMMEDIATELY: *FEVER GREATER THAN 100.4 F (38 C) OR HIGHER *CHILLS OR SWEATING *NAUSEA AND VOMITING THAT IS NOT CONTROLLED WITH YOUR NAUSEA MEDICATION *UNUSUAL SHORTNESS OF BREATH *UNUSUAL BRUISING OR BLEEDING *URINARY PROBLEMS (pain or burning when urinating, or frequent urination) *BOWEL PROBLEMS (unusual diarrhea, constipation, pain near the anus) TENDERNESS IN MOUTH AND THROAT WITH OR WITHOUT PRESENCE OF ULCERS (sore throat, sores in mouth, or a toothache) UNUSUAL RASH, SWELLING OR PAIN  UNUSUAL VAGINAL DISCHARGE OR ITCHING   Items with * indicate a potential emergency and should be followed up as soon as possible or go to the Emergency Department if any problems should occur.  Please show the CHEMOTHERAPY ALERT CARD or  IMMUNOTHERAPY ALERT CARD at check-in to the Emergency Department and triage nurse. Should you have questions after your visit or need to cancel or reschedule your appointment, please contact [facility] - A DEPT OF [facility]  [phone] and follow the prompts.  Office hours are 8:00 a.m. to 4:30 p.m. Monday - Friday. Please note that voicemails left after 4:00 p.m. may not be returned until the following business day.  We are closed weekends and major holidays. You have access to a nurse at all times for urgent questions. Please call the main number to the clinic [phone] and follow the prompts.  For any non-urgent questions, you may also contact your provider using MyChart. We now offer e-Visits for anyone 23 and older to request care online for non-urgent symptoms. For details visit [email or web address].   Also download the MyChart app! Go to the app store, search "MyChart", open the app, select [place], and log in with your MyChart username and password.

## 2023-06-26 ENCOUNTER — Encounter: Payer: Self-pay | Admitting: Internal Medicine

## 2023-06-27 ENCOUNTER — Inpatient Hospital Stay: Payer: Medicare HMO

## 2023-06-27 ENCOUNTER — Other Ambulatory Visit: Payer: Self-pay

## 2023-06-27 VITALS — BP 131/85 | HR 63 | Temp 97.8°F | Resp 20

## 2023-06-27 DIAGNOSIS — Z5112 Encounter for antineoplastic immunotherapy: Secondary | ICD-10-CM | POA: Diagnosis not present

## 2023-06-27 DIAGNOSIS — C20 Malignant neoplasm of rectum: Secondary | ICD-10-CM

## 2023-06-27 MED ORDER — SODIUM CHLORIDE 0.9% FLUSH
10.0000 mL | INTRAVENOUS | Status: DC | PRN
  Administered 2023-06-27: 10 mL

## 2023-06-27 MED ORDER — HEPARIN SOD (PORK) LOCK FLUSH 100 UNIT/ML IV SOLN
500.0000 [IU] | Freq: Once | INTRAVENOUS | Status: AC | PRN
  Administered 2023-06-27: 500 [IU]

## 2023-06-27 NOTE — Patient Instructions (Signed)

## 2023-07-09 ENCOUNTER — Inpatient Hospital Stay: Payer: Medicare HMO | Attending: Hematology & Oncology

## 2023-07-09 ENCOUNTER — Inpatient Hospital Stay: Payer: Medicare HMO

## 2023-07-09 ENCOUNTER — Inpatient Hospital Stay (HOSPITAL_BASED_OUTPATIENT_CLINIC_OR_DEPARTMENT_OTHER): Payer: Medicare HMO | Admitting: Hematology & Oncology

## 2023-07-09 ENCOUNTER — Encounter: Payer: Self-pay | Admitting: Hematology & Oncology

## 2023-07-09 ENCOUNTER — Other Ambulatory Visit: Payer: Self-pay

## 2023-07-09 VITALS — BP 109/66 | HR 73 | Temp 98.0°F | Resp 19 | Ht 70.0 in | Wt 207.0 lb

## 2023-07-09 DIAGNOSIS — C20 Malignant neoplasm of rectum: Secondary | ICD-10-CM

## 2023-07-09 DIAGNOSIS — Z8673 Personal history of transient ischemic attack (TIA), and cerebral infarction without residual deficits: Secondary | ICD-10-CM | POA: Diagnosis not present

## 2023-07-09 DIAGNOSIS — Z5111 Encounter for antineoplastic chemotherapy: Secondary | ICD-10-CM | POA: Insufficient documentation

## 2023-07-09 DIAGNOSIS — Z452 Encounter for adjustment and management of vascular access device: Secondary | ICD-10-CM | POA: Insufficient documentation

## 2023-07-09 DIAGNOSIS — Z5112 Encounter for antineoplastic immunotherapy: Secondary | ICD-10-CM | POA: Insufficient documentation

## 2023-07-09 DIAGNOSIS — Z7901 Long term (current) use of anticoagulants: Secondary | ICD-10-CM | POA: Insufficient documentation

## 2023-07-09 DIAGNOSIS — Z7982 Long term (current) use of aspirin: Secondary | ICD-10-CM | POA: Insufficient documentation

## 2023-07-09 DIAGNOSIS — I4891 Unspecified atrial fibrillation: Secondary | ICD-10-CM | POA: Diagnosis not present

## 2023-07-09 LAB — CBC WITH DIFFERENTIAL (CANCER CENTER ONLY)
Abs Immature Granulocytes: 0.02 10*3/uL (ref 0.00–0.07)
Basophils Absolute: 0 10*3/uL (ref 0.0–0.1)
Basophils Relative: 0 %
Eosinophils Absolute: 0.1 10*3/uL (ref 0.0–0.5)
Eosinophils Relative: 2 %
HCT: 27.5 % — ABNORMAL LOW (ref 39.0–52.0)
Hemoglobin: 9.7 g/dL — ABNORMAL LOW (ref 13.0–17.0)
Immature Granulocytes: 1 %
Lymphocytes Relative: 24 %
Lymphs Abs: 0.8 10*3/uL (ref 0.7–4.0)
MCH: 37.2 pg — ABNORMAL HIGH (ref 26.0–34.0)
MCHC: 35.3 g/dL (ref 30.0–36.0)
MCV: 105.4 fL — ABNORMAL HIGH (ref 80.0–100.0)
Monocytes Absolute: 0.4 10*3/uL (ref 0.1–1.0)
Monocytes Relative: 12 %
Neutro Abs: 2.1 10*3/uL (ref 1.7–7.7)
Neutrophils Relative %: 61 %
Platelet Count: 100 10*3/uL — ABNORMAL LOW (ref 150–400)
RBC: 2.61 MIL/uL — ABNORMAL LOW (ref 4.22–5.81)
RDW: 20 % — ABNORMAL HIGH (ref 11.5–15.5)
WBC Count: 3.3 10*3/uL — ABNORMAL LOW (ref 4.0–10.5)
nRBC: 2.4 % — ABNORMAL HIGH (ref 0.0–0.2)

## 2023-07-09 LAB — CMP (CANCER CENTER ONLY)
ALT: 16 U/L (ref 0–44)
AST: 15 U/L (ref 15–41)
Albumin: 3.8 g/dL (ref 3.5–5.0)
Alkaline Phosphatase: 62 U/L (ref 38–126)
Anion gap: 9 (ref 5–15)
BUN: 16 mg/dL (ref 8–23)
CO2: 25 mmol/L (ref 22–32)
Calcium: 8.2 mg/dL — ABNORMAL LOW (ref 8.9–10.3)
Chloride: 104 mmol/L (ref 98–111)
Creatinine: 0.91 mg/dL (ref 0.61–1.24)
GFR, Estimated: >60 mL/min (ref >60)
Glucose, Bld: 312 mg/dL — ABNORMAL HIGH (ref 70–99)
Potassium: 3.3 mmol/L — ABNORMAL LOW (ref 3.5–5.1)
Sodium: 138 mmol/L (ref 135–145)
Total Bilirubin: 1.1 mg/dL (ref 0.0–1.2)
Total Protein: 5.9 g/dL — ABNORMAL LOW (ref 6.5–8.1)

## 2023-07-09 LAB — CEA (ACCESS): CEA (CHCC): 4.49 ng/mL (ref 0.00–5.00)

## 2023-07-09 LAB — MAGNESIUM: Magnesium: 1.6 mg/dL — ABNORMAL LOW (ref 1.7–2.4)

## 2023-07-09 MED ORDER — DEXAMETHASONE SODIUM PHOSPHATE 10 MG/ML IJ SOLN
10.0000 mg | Freq: Once | INTRAMUSCULAR | Status: AC
  Administered 2023-07-09: 10 mg via INTRAVENOUS
  Filled 2023-07-09: qty 1

## 2023-07-09 MED ORDER — PALONOSETRON HCL INJECTION 0.25 MG/5ML
0.2500 mg | Freq: Once | INTRAVENOUS | Status: AC
  Administered 2023-07-09: 0.25 mg via INTRAVENOUS
  Filled 2023-07-09: qty 5

## 2023-07-09 MED ORDER — SODIUM CHLORIDE 0.9 % IV SOLN
6.0000 mg/kg | Freq: Once | INTRAVENOUS | Status: AC
  Administered 2023-07-09: 600 mg via INTRAVENOUS
  Filled 2023-07-09: qty 20

## 2023-07-09 MED ORDER — DEXTROSE 5 % IV SOLN
INTRAVENOUS | Status: DC
Start: 2023-07-09 — End: 2023-07-09

## 2023-07-09 MED ORDER — OXALIPLATIN CHEMO INJECTION 100 MG/20ML
85.0000 mg/m2 | Freq: Once | INTRAVENOUS | Status: AC
  Administered 2023-07-09: 200 mg via INTRAVENOUS
  Filled 2023-07-09: qty 40

## 2023-07-09 MED ORDER — SODIUM CHLORIDE 0.9 % IV SOLN
2400.0000 mg/m2 | INTRAVENOUS | Status: DC
  Administered 2023-07-09: 5000 mg via INTRAVENOUS
  Filled 2023-07-09: qty 100

## 2023-07-09 MED ORDER — LEUCOVORIN CALCIUM INJECTION 350 MG
400.0000 mg/m2 | Freq: Once | INTRAVENOUS | Status: AC
  Administered 2023-07-09: 872 mg via INTRAVENOUS
  Filled 2023-07-09: qty 43.6

## 2023-07-09 MED ORDER — FLUOROURACIL CHEMO INJECTION 2.5 GM/50ML
400.0000 mg/m2 | Freq: Once | INTRAVENOUS | Status: AC
  Administered 2023-07-09: 850 mg via INTRAVENOUS
  Filled 2023-07-09: qty 17

## 2023-07-09 MED ORDER — SODIUM CHLORIDE 0.9 % IV SOLN: INTRAVENOUS | Status: DC

## 2023-07-09 NOTE — Patient Instructions (Signed)

## 2023-07-09 NOTE — Patient Instructions (Signed)
 [facility]  Discharge Instructions: Thank you for choosing [facility] to provide your oncology and hematology care.   If you have a lab appointment with the Cancer Center, please go directly to the Cancer Center and check in at the registration area.  Wear comfortable clothing and clothing appropriate for easy access to any Portacath or PICC line.   We strive to give you quality time with your provider. You may need to reschedule your appointment if you arrive late (15 or more minutes).  Arriving late affects you and other patients whose appointments are after yours.  Also, if you miss three or more appointments without notifying the office, you may be dismissed from the clinic at the provider's discretion.      For prescription refill requests, have your pharmacy contact our office and allow 72 hours for refills to be completed.    Today you received the following chemotherapy and/or immunotherapy agents FOLFOX Vectibix      To help prevent nausea and vomiting after your treatment, we encourage you to take your nausea medication as directed.  BELOW ARE SYMPTOMS THAT SHOULD BE REPORTED IMMEDIATELY: *FEVER GREATER THAN 100.4 F (38 C) OR HIGHER *CHILLS OR SWEATING *NAUSEA AND VOMITING THAT IS NOT CONTROLLED WITH YOUR NAUSEA MEDICATION *UNUSUAL SHORTNESS OF BREATH *UNUSUAL BRUISING OR BLEEDING *URINARY PROBLEMS (pain or burning when urinating, or frequent urination) *BOWEL PROBLEMS (unusual diarrhea, constipation, pain near the anus) TENDERNESS IN MOUTH AND THROAT WITH OR WITHOUT PRESENCE OF ULCERS (sore throat, sores in mouth, or a toothache) UNUSUAL RASH, SWELLING OR PAIN  UNUSUAL VAGINAL DISCHARGE OR ITCHING   Items with * indicate a potential emergency and should be followed up as soon as possible or go to the Emergency Department if any problems should occur.  Please show the CHEMOTHERAPY ALERT CARD or  IMMUNOTHERAPY ALERT CARD at check-in to the Emergency Department and triage nurse. Should you have questions after your visit or need to cancel or reschedule your appointment, please contact [facility] - A DEPT OF [facility]  [phone] and follow the prompts.  Office hours are 8:00 a.m. to 4:30 p.m. Monday - Friday. Please note that voicemails left after 4:00 p.m. may not be returned until the following business day.  We are closed weekends and major holidays. You have access to a nurse at all times for urgent questions. Please call the main number to the clinic [phone] and follow the prompts.  For any non-urgent questions, you may also contact your provider using MyChart. We now offer e-Visits for anyone 34 and older to request care online for non-urgent symptoms. For details visit [email or web address].   Also download the MyChart app! Go to the app store, search "MyChart", open the app, select [place], and log in with your MyChart username and password.

## 2023-07-09 NOTE — Progress Notes (Signed)
 Hematology and Oncology Follow Up Visit  John Douglas [id number] 1955/05/24 68 y.o. 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 twice daily Aspirin 81 mg p.o. daily FOLFOX/Vectibix --start on 03/07/2023; s/p cycle #7 Xgeva 120 mg subcu every 3 months-start 03/07/2023 - patient declined any further Xgeva.                           Interim History:  John Douglas is here today for follow-up and treatment.  So far, he has done fairly well with chemotherapy.  He does have some small abrasions on his fingertips.  I told to make sure he uses some lotion on his hands.  He really does not have too much of a rash with the Vectibix.  He is doing quite well with this.  His last CEA was up a little bit.  He was 3.57.  We will have to see what this level is.  He is probably can be due for another PET scan.  He has had a good appetite.  There is been no nausea or vomiting.  Of course, he really does not watch his blood sugars all that well.  He is having a Coke this morning.  He has had no fever.  He has had no bleeding.  He has elevated diarrhea.  He does take supplemental magnesium.  Overall, I would have said that his performance status is probably ECOG 1.   Medications:  Allergies as of 07/09/2023       Reactions   Celexa [citalopram] Other (See Comments)   Contraindicated with A-Fib   Ibuprofen Other (See Comments)   Was told to not take this    Metformin And Related Other (See Comments)   Bloody  stools    Naproxen Other (See Comments)   Was told to not take this    Yellow Jacket Venom [bee Venom] Swelling, Other (See Comments)   Severe swelling where stung   Penicillins Hives   Did it involve swelling of the face/tongue/throat, SOB, or low BP? Unk Did it involve sudden or severe rash/hives, skin peeling, or any reaction on the inside of your mouth or nose? Yes Did you need to seek medical attention at a hospital or doctor's office? Unk When did it last happen? "Childhood- 55 years ago" If all above answers are "NO", may proceed with cephalosporin use.   Xgeva [denosumab] Other (See Comments)   Runny and bloody nose, lower back pain, watery discharge from eyes, sinuses were raw feeling        Medication List        Accurate as of July 09, 2023  9:30 AM. If you have any questions, ask your nurse or doctor.          diltiazem 240 MG 24 hr capsule Commonly known as: CARDIZEM CD TAKE 1 CAPSULE BY MOUTH EVERY DAY   doxycycline 100 MG tablet Commonly known as: VIBRA-TABS Take 1 tablet (100 mg total) by mouth 2 (two) times daily.   Eliquis 5 MG Tabs tablet Generic  drug: apixaban TAKE 1 TABLET BY MOUTH TWICE A DAY   ezetimibe 10 MG tablet Commonly known as: ZETIA Take 1 tablet by mouth once daily   fluticasone 50 MCG/ACT nasal spray Commonly known as: Flonase Place 1 spray into both nostrils 2 (two) times daily as needed (sinus congestion).   furosemide 40 MG tablet Commonly known as: LASIX TAKE 1 TABLET BY MOUTH EVERY DAY   glimepiride 1 MG tablet Commonly known as: AMARYL Take 1 mg by mouth daily.   HYDROcodone-acetaminophen 5-325 MG tablet Commonly known as: Norco Take 1 tablet by mouth every 6 (six) hours as needed for moderate pain (pain score 4-6).   metoprolol succinate 50 MG 24 hr tablet Commonly known as: TOPROL-XL TAKE 1 TABLET BY MOUTH EVERY DAY   mupirocin ointment 2 % Commonly known as: BACTROBAN Apply 1 Application topically 3 (three)  times daily. Apply to pinky that has the infection.   nitroGLYCERIN 0.4 MG SL tablet Commonly known as: NITROSTAT Place 1 tablet (0.4 mg total) under the tongue every 5 (five) minutes as needed for chest pain.   ondansetron 8 MG tablet Commonly known as: Zofran Take 1 tablet (8 mg total) by mouth every 8 (eight) hours as needed for nausea or vomiting. Start on the third day after chemotherapy.   potassium chloride SA 20 MEQ tablet Commonly known as: Klor-Con M20 Take 2 tablets (40 mEq total) by mouth daily.   pramoxine-hydrocortisone 1-1 % rectal cream Commonly known as: PROCTOCREAM-HC Place 1 application  rectally 2 (two) times daily as needed for hemorrhoids or anal itching.   prochlorperazine 10 MG tablet Commonly known as: COMPAZINE Take 1 tablet (10 mg total) by mouth every 6 (six) hours as needed for nausea or vomiting.   sulfacetamide 10 % ophthalmic solution Commonly known as: Bleph-10 Place 1 drop into both eyes every 4 (four) hours.        Allergies:  Allergies  Allergen Reactions   Celexa [Citalopram] Other (See Comments)    Contraindicated with A-Fib   Ibuprofen Other (See Comments)    Was told to not take this    Metformin And Related Other (See Comments)    Bloody stools    Naproxen Other (See Comments)    Was told to not take this    Yellow Jacket Venom [Bee Venom] Swelling and Other (See Comments)    Severe swelling where stung   Penicillins Hives    Did it involve swelling of the face/tongue/throat, SOB, or low BP? Unk Did it involve sudden or severe rash/hives, skin peeling, or any reaction on the inside of your mouth or nose? Yes Did you need to seek medical attention at a hospital or doctor's office? Unk When did it last happen? "Childhood- 53 years ago" If all above answers are "NO", may proceed with cephalosporin use.    Rivka Barbara [Denosumab] Other (See Comments)    Runny and bloody nose, lower back pain, watery discharge from eyes, sinuses were  raw feeling    Past Medical History, Surgical history, Social history, and Family History were reviewed and updated.  Review of Systems: Review of Systems  Constitutional: Negative.   HENT: Negative.    Eyes: Negative.   Respiratory: Negative.    Cardiovascular: Negative.   Gastrointestinal: Negative.   Genitourinary: Negative.   Musculoskeletal: Negative.   Skin: Negative.   Neurological: Negative.   Endo/Heme/Allergies: Negative.   Psychiatric/Behavioral: Negative.       Physical Exam:  height is 5\' 10"  (1.778 m)  and weight is 207 lb (93.9 kg). His oral temperature is 98 F (36.7 C). His blood pressure is 109/66 and his pulse is 73. His respiration is 19 and oxygen saturation is 99%.   Wt Readings from Last 3 Encounters:  07/09/23 207 lb (93.9 kg)  06/24/23 209 lb 12.8 oz (95.2 kg)  06/11/23 208 lb (94.3 kg)    Physical Exam Vitals reviewed.  HENT:     Head: Normocephalic and atraumatic.  Eyes:     Pupils: Pupils are equal, round, and reactive to light.  Cardiovascular:     Rate and Rhythm: Normal rate and regular rhythm.     Heart sounds: Normal heart sounds.  Pulmonary:     Effort: Pulmonary effort is normal.     Breath sounds: Normal breath sounds.  Abdominal:     General: Bowel sounds are normal.     Palpations: Abdomen is soft.  Musculoskeletal:        General: No tenderness or deformity. Normal range of motion.     Cervical back: Normal range of motion.  Lymphadenopathy:     Cervical: No cervical adenopathy.  Skin:    General: Skin is warm and dry.     Findings: No erythema or rash.  Neurological:     Mental Status: He is alert and oriented to person, place, and time.  Psychiatric:        Behavior: Behavior normal.        Thought Content: Thought content normal.        Judgment: Judgment normal.     Lab Results  Component Value Date   WBC 3.3 (L) 07/09/2023   HGB 9.7 (L) 07/09/2023   HCT 27.5 (L) 07/09/2023   MCV 105.4 (H) 07/09/2023   PLT  100 (L) 07/09/2023   Lab Results  Component Value Date   FERRITIN 532 (H) 04/16/2023   IRON 80 04/16/2023   TIBC 283 04/16/2023   UIBC 203 04/16/2023   IRONPCTSAT 28 04/16/2023   Lab Results  Component Value Date   RETICCTPCT 5.4 (H) 08/15/2020   RBC 2.61 (L) 07/09/2023   No results found for: "KPAFRELGTCHN", "LAMBDASER", "KAPLAMBRATIO" No results found for: "IGGSERUM", "IGA", "IGMSERUM" No results found for: "TOTALPROTELP", "ALBUMINELP", "A1GS", "A2GS", "BETS", "BETA2SER", "GAMS", "MSPIKE", "SPEI"   Chemistry      Component Value Date/Time   NA 138 07/09/2023 0833   NA 140 08/28/2019 0940   K 3.3 (L) 07/09/2023 0833   CL 104 07/09/2023 0833   CO2 25 07/09/2023 0833   BUN 16 07/09/2023 0833   BUN 21 08/28/2019 0940   CREATININE 0.91 07/09/2023 0833      Component Value Date/Time   CALCIUM 8.2 (L) 07/09/2023 0833   ALKPHOS 62 07/09/2023 0833   AST 15 07/09/2023 0833   ALT 16 07/09/2023 0833   BILITOT 1.1 07/09/2023 0833       Impression and Plan: John Douglas is a pleasant 68 yo caucasian gentleman with history of CVA secondary to atrial fib and rectal bleeding. He was diagnosed with metastatic adenocarcinoma of the rectum -K-ras wild type/BRAF wild-type/HER-2 negative/MMR proficient. He had adenopathy distant to the rectum and now has disease recurrence.    We will proceed with cycle #8 of FOLFOX/Vectibix today.  We will go ahead and then plan for a follow-up PET scan.  I think this would be helpful for [place].  Hopefully, we will see that he is responding.  We will see what the CEA level is.  I think  this will be very important for [place].  I will give an extra week off from treatment.  Will see him back in 3 weeks.    Josph Macho, MD 3/11/20259:30 AM

## 2023-07-10 ENCOUNTER — Other Ambulatory Visit: Payer: Self-pay

## 2023-07-10 ENCOUNTER — Encounter: Payer: Self-pay | Admitting: Hematology & Oncology

## 2023-07-11 ENCOUNTER — Other Ambulatory Visit: Payer: Self-pay | Admitting: Internal Medicine

## 2023-07-11 ENCOUNTER — Inpatient Hospital Stay: Payer: Medicare HMO

## 2023-07-11 ENCOUNTER — Other Ambulatory Visit: Payer: Self-pay

## 2023-07-11 VITALS — BP 126/82 | HR 80 | Temp 97.7°F | Resp 20

## 2023-07-11 DIAGNOSIS — Z5112 Encounter for antineoplastic immunotherapy: Secondary | ICD-10-CM | POA: Diagnosis not present

## 2023-07-11 DIAGNOSIS — C20 Malignant neoplasm of rectum: Secondary | ICD-10-CM

## 2023-07-11 MED ORDER — SODIUM CHLORIDE 0.9% FLUSH
10.0000 mL | INTRAVENOUS | Status: DC | PRN
  Administered 2023-07-11: 10 mL

## 2023-07-11 MED ORDER — HEPARIN SOD (PORK) LOCK FLUSH 100 UNIT/ML IV SOLN
500.0000 [IU] | Freq: Once | INTRAVENOUS | Status: AC | PRN
  Administered 2023-07-11: 500 [IU]

## 2023-07-15 ENCOUNTER — Encounter (HOSPITAL_COMMUNITY)
Admission: RE | Admit: 2023-07-15 | Discharge: 2023-07-15 | Disposition: A | Discharge status: Home / Self Care | Source: Ambulatory Visit | Attending: Hematology & Oncology | Admitting: Hematology & Oncology

## 2023-07-15 DIAGNOSIS — C20 Malignant neoplasm of rectum: Secondary | ICD-10-CM | POA: Insufficient documentation

## 2023-07-15 LAB — GLUCOSE, CAPILLARY: Glucose-Capillary: 254 mg/dL — ABNORMAL HIGH (ref 70–99)

## 2023-07-15 MED ORDER — FLUDEOXYGLUCOSE F - 18 (FDG) INJECTION
10.0000 | Freq: Once | INTRAVENOUS | Status: AC | PRN
  Administered 2023-07-15: 10.33 via INTRAVENOUS

## 2023-07-16 ENCOUNTER — Encounter: Payer: Self-pay | Admitting: *Deleted

## 2023-07-30 ENCOUNTER — Inpatient Hospital Stay: Attending: Hematology & Oncology

## 2023-07-30 ENCOUNTER — Inpatient Hospital Stay

## 2023-07-30 ENCOUNTER — Inpatient Hospital Stay: Admitting: Hematology & Oncology

## 2023-07-30 ENCOUNTER — Telehealth: Payer: Self-pay | Admitting: Pharmacist

## 2023-07-30 ENCOUNTER — Telehealth: Payer: Self-pay | Admitting: Pharmacy Technician

## 2023-07-30 ENCOUNTER — Other Ambulatory Visit (HOSPITAL_COMMUNITY): Payer: Self-pay

## 2023-07-30 ENCOUNTER — Other Ambulatory Visit: Payer: Self-pay

## 2023-07-30 ENCOUNTER — Encounter: Payer: Self-pay | Admitting: Hematology & Oncology

## 2023-07-30 VITALS — BP 121/89 | HR 96 | Temp 99.0°F | Resp 18 | Ht 70.0 in | Wt 195.0 lb

## 2023-07-30 DIAGNOSIS — Z452 Encounter for adjustment and management of vascular access device: Secondary | ICD-10-CM | POA: Insufficient documentation

## 2023-07-30 DIAGNOSIS — C20 Malignant neoplasm of rectum: Secondary | ICD-10-CM | POA: Insufficient documentation

## 2023-07-30 DIAGNOSIS — D500 Iron deficiency anemia secondary to blood loss (chronic): Secondary | ICD-10-CM | POA: Diagnosis not present

## 2023-07-30 DIAGNOSIS — D649 Anemia, unspecified: Secondary | ICD-10-CM | POA: Insufficient documentation

## 2023-07-30 DIAGNOSIS — Z7982 Long term (current) use of aspirin: Secondary | ICD-10-CM | POA: Diagnosis not present

## 2023-07-30 DIAGNOSIS — Z95828 Presence of other vascular implants and grafts: Secondary | ICD-10-CM

## 2023-07-30 DIAGNOSIS — I4891 Unspecified atrial fibrillation: Secondary | ICD-10-CM | POA: Insufficient documentation

## 2023-07-30 DIAGNOSIS — Z7901 Long term (current) use of anticoagulants: Secondary | ICD-10-CM | POA: Insufficient documentation

## 2023-07-30 DIAGNOSIS — K922 Gastrointestinal hemorrhage, unspecified: Secondary | ICD-10-CM | POA: Diagnosis not present

## 2023-07-30 LAB — CBC WITH DIFFERENTIAL (CANCER CENTER ONLY)
Abs Immature Granulocytes: 0.5 10*3/uL — ABNORMAL HIGH (ref 0.00–0.07)
Basophils Absolute: 0.1 10*3/uL (ref 0.0–0.1)
Basophils Relative: 1 %
Eosinophils Absolute: 0 10*3/uL (ref 0.0–0.5)
Eosinophils Relative: 0 %
HCT: 27.5 % — ABNORMAL LOW (ref 39.0–52.0)
Hemoglobin: 9.7 g/dL — ABNORMAL LOW (ref 13.0–17.0)
Immature Granulocytes: 7 %
Lymphocytes Relative: 13 %
Lymphs Abs: 1 10*3/uL (ref 0.7–4.0)
MCH: 37 pg — ABNORMAL HIGH (ref 26.0–34.0)
MCHC: 35.3 g/dL (ref 30.0–36.0)
MCV: 105 fL — ABNORMAL HIGH (ref 80.0–100.0)
Monocytes Absolute: 1.2 10*3/uL — ABNORMAL HIGH (ref 0.1–1.0)
Monocytes Relative: 15 %
Neutro Abs: 5 10*3/uL (ref 1.7–7.7)
Neutrophils Relative %: 64 %
Platelet Count: 175 10*3/uL (ref 150–400)
RBC: 2.62 MIL/uL — ABNORMAL LOW (ref 4.22–5.81)
RDW: 18.7 % — ABNORMAL HIGH (ref 11.5–15.5)
Smear Review: NORMAL
WBC Count: 7.7 10*3/uL (ref 4.0–10.5)
nRBC: 2 % — ABNORMAL HIGH (ref 0.0–0.2)

## 2023-07-30 LAB — CEA (ACCESS): CEA (CHCC): 3.38 ng/mL (ref 0.00–5.00)

## 2023-07-30 LAB — CMP (CANCER CENTER ONLY)
ALT: 13 U/L (ref 0–44)
AST: 15 U/L (ref 15–41)
Albumin: 3.6 g/dL (ref 3.5–5.0)
Alkaline Phosphatase: 66 U/L (ref 38–126)
Anion gap: 9 (ref 5–15)
BUN: 12 mg/dL (ref 8–23)
CO2: 32 mmol/L (ref 22–32)
Calcium: 8.2 mg/dL — ABNORMAL LOW (ref 8.9–10.3)
Chloride: 99 mmol/L (ref 98–111)
Creatinine: 0.81 mg/dL (ref 0.61–1.24)
GFR, Estimated: >60 mL/min (ref >60)
Glucose, Bld: 191 mg/dL — ABNORMAL HIGH (ref 70–99)
Potassium: 2.7 mmol/L — CL (ref 3.5–5.1)
Sodium: 140 mmol/L (ref 135–145)
Total Bilirubin: 1.3 mg/dL — ABNORMAL HIGH (ref 0.0–1.2)
Total Protein: 6.2 g/dL — ABNORMAL LOW (ref 6.5–8.1)

## 2023-07-30 LAB — LACTATE DEHYDROGENASE: LDH: 290 U/L — ABNORMAL HIGH (ref 98–192)

## 2023-07-30 LAB — MAGNESIUM: Magnesium: 1.2 mg/dL — ABNORMAL LOW (ref 1.7–2.4)

## 2023-07-30 MED ORDER — SODIUM CHLORIDE 0.9% FLUSH
10.0000 mL | Freq: Once | INTRAVENOUS | Status: AC
  Administered 2023-07-30: 10 mL via INTRAVENOUS

## 2023-07-30 MED ORDER — HEPARIN SOD (PORK) LOCK FLUSH 100 UNIT/ML IV SOLN
500.0000 [IU] | Freq: Once | INTRAVENOUS | Status: AC
  Administered 2023-07-30: 500 [IU] via INTRAVENOUS

## 2023-07-30 MED ORDER — TRIFLURIDINE-TIPIRACIL 15-6.14 MG PO TABS
35.0000 mg/m2 | ORAL_TABLET | Freq: Two times a day (BID) | ORAL | 6 refills | Status: DC
  Filled 2023-07-31: qty 100, 28d supply, fill #0
  Filled 2023-08-26: qty 100, 28d supply, fill #1

## 2023-07-30 NOTE — Telephone Encounter (Signed)
 Oral Oncology Patient Advocate Encounter   Received notification that prior authorization for [place] is required.   PA submitted on 07/30/2023 Key [id number] Status is pending     Patty Almedia Balls, CPhT Oncology Pharmacy Patient Advocate [facility] Direct Number: [phone] Fax: [phone]

## 2023-07-30 NOTE — Addendum Note (Signed)
 Addended by: Cooper Render on: 07/30/2023 08:59 AM   Modules accepted: Orders

## 2023-07-30 NOTE — Telephone Encounter (Signed)
 Oral Oncology Patient Advocate Encounter  Prior Authorization for John Douglas has been approved.    PA# [id number] Effective dates: 04/29/2024 through 05/01/2023  Patients co-pay is $56.    John Douglas, CPhT Oncology Pharmacy Patient Advocate [facility] Direct Number: [phone] Fax: [phone]

## 2023-07-30 NOTE — Progress Notes (Signed)
 Hematology and Oncology Follow Up Visit  John Douglas [id number] November 08, 1955 68 y.o. 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 twice daily Aspirin 81 mg p.o. daily FOLFOX/Vectibix --start on 03/07/2023; s/p cycle #8 - d/c on 07/30/2023 Xgeva 120 mg subcu every 3 months-start 03/07/2023 - patient declined any further Xgeva. Lonsurf 75 mg po BID - start on 08/05/2023                           Interim History:  John Douglas is here today for follow-up.  He is not feeling all that well.  He said the last cycle of chemotherapy really got him.  I totally understand this.  He did do a PET scan on him.  The PET scan showed everything was holding pretty steady.  There is nothing new that was on the PET scan.  His CEA has been trending upward slowly.  When we last saw him, the CEA was 4.49.  Really think going ahead to make a change in his protocol now.  This is all about quality of life.  I think that he would be a good candidate for Lonsurf.  He would like to have something oral.  I think this would be reasonable.  I talked him about Lonsurf.  I explained to him that he would take 5 pills twice a day for 5 days on and 2 that is off and 5 days on and and 2 weeks off.  We will just go with Lonsurf myself.  I know he is on anticoagulation.  I suppose we could certainly use Avastin with the Lonsurf.  He has had a decent appetite.  The pollen really is bothering him right now.  He has had no change in bowel or  bladder habits.  He has had no obvious bleeding.  He has had no fever.  Overall, I would say his performance status is probably ECOG 1.   Medications:  Allergies as of 07/30/2023       Reactions   Celexa [citalopram] Other (See Comments)   Contraindicated with A-Fib   Ibuprofen Other (See Comments)   Was told to not take this    Metformin And Related Other (See Comments)   Bloody stools    Naproxen Other (See Comments)   Was told to not take this    Yellow Jacket Venom [bee Venom] Swelling, Other (See Comments)   Severe swelling where stung   Penicillins Hives   Did it involve swelling of the face/tongue/throat, SOB, or low BP? Unk Did it involve sudden or severe rash/hives, skin peeling, or any reaction on the inside of your mouth or nose? Yes Did you need to seek medical attention at a hospital or doctor's office? Unk When did it last happen? "Childhood- 55 years ago" If all above answers are "NO", may proceed with cephalosporin use.   Xgeva [denosumab] Other (See Comments)   Runny and bloody nose, lower back pain, watery discharge from eyes, sinuses were raw feeling        Medication  List        Accurate as of July 30, 2023  8:22 AM. If you have any questions, ask your nurse or doctor.          STOP taking these medications    doxycycline 100 MG tablet Commonly known as: VIBRA-TABS Stopped by: Josph Macho       TAKE these medications    diltiazem 240 MG 24 hr capsule Commonly known as: CARDIZEM CD TAKE 1 CAPSULE BY MOUTH EVERY DAY   Eliquis 5 MG Tabs tablet Generic drug: apixaban TAKE 1 TABLET BY MOUTH TWICE A DAY   ezetimibe 10 MG tablet Commonly known as: ZETIA Take 1 tablet by mouth once daily   fluticasone 50 MCG/ACT nasal spray Commonly known as: Flonase Place 1 spray into both nostrils 2 (two) times daily as needed (sinus congestion).   furosemide 40 MG tablet Commonly known as: LASIX TAKE 1 TABLET BY MOUTH EVERY DAY   glimepiride 1  MG tablet Commonly known as: AMARYL Take 1 mg by mouth daily.   HYDROcodone-acetaminophen 5-325 MG tablet Commonly known as: Norco Take 1 tablet by mouth every 6 (six) hours as needed for moderate pain (pain score 4-6).   metoprolol succinate 50 MG 24 hr tablet Commonly known as: TOPROL-XL TAKE 1 TABLET BY MOUTH EVERY DAY   mupirocin ointment 2 % Commonly known as: BACTROBAN Apply 1 Application topically 3 (three) times daily. Apply to pinky that has the infection.   nitroGLYCERIN 0.4 MG SL tablet Commonly known as: NITROSTAT Place 1 tablet (0.4 mg total) under the tongue every 5 (five) minutes as needed for chest pain.   ondansetron 8 MG tablet Commonly known as: Zofran Take 1 tablet (8 mg total) by mouth every 8 (eight) hours as needed for nausea or vomiting. Start on the third day after chemotherapy.   potassium chloride SA 20 MEQ tablet Commonly known as: Klor-Con M20 Take 2 tablets (40 mEq total) by mouth daily.   pramoxine-hydrocortisone 1-1 % rectal cream Commonly known as: PROCTOCREAM-HC Place 1 application  rectally 2 (two) times daily as needed for hemorrhoids or anal itching.   prochlorperazine 10 MG tablet Commonly known as: COMPAZINE Take 1 tablet (10 mg total) by mouth every 6 (six) hours as needed for nausea or vomiting.   sulfacetamide 10 % ophthalmic solution Commonly known as: Bleph-10 Place 1 drop into both eyes every 4 (four) hours.        Allergies:  Allergies  Allergen Reactions   Celexa [Citalopram] Other (See Comments)    Contraindicated with A-Fib   Ibuprofen Other (See Comments)    Was told to not take this    Metformin And Related Other (See Comments)    Bloody stools    Naproxen Other (See Comments)    Was told to not take this    Yellow Jacket Venom [Bee Venom] Swelling and Other (See Comments)    Severe swelling where stung   Penicillins Hives    Did it involve swelling of the face/tongue/throat, SOB, or low BP? Unk Did it  involve sudden or severe rash/hives, skin peeling, or any reaction on the inside of your mouth or nose? Yes Did you need to seek medical attention at a hospital or doctor's office? Unk When did it last happen? "Childhood- 55 years ago" If all above answers are "NO", may proceed with cephalosporin use.    Rivka Barbara [Denosumab] Other (See Comments)    Runny and bloody nose, lower back pain, watery discharge  from eyes, sinuses were raw feeling    Past Medical History, Surgical history, Social history, and Family History were reviewed and updated.  Review of Systems: Review of Systems  Constitutional: Negative.   HENT: Negative.    Eyes: Negative.   Respiratory: Negative.    Cardiovascular: Negative.   Gastrointestinal: Negative.   Genitourinary: Negative.   Musculoskeletal: Negative.   Skin: Negative.   Neurological: Negative.   Endo/Heme/Allergies: Negative.   Psychiatric/Behavioral: Negative.       Physical Exam:  height is 5\' 10"  (1.778 m) and weight is 195 lb (88.5 kg). His oral temperature is 99 F (37.2 C). His blood pressure is 121/89 and his pulse is 96. His respiration is 18 and oxygen saturation is 99%.   Wt Readings from Last 3 Encounters:  07/30/23 195 lb (88.5 kg)  07/09/23 207 lb (93.9 kg)  06/24/23 209 lb 12.8 oz (95.2 kg)    Physical Exam Vitals reviewed.  HENT:     Head: Normocephalic and atraumatic.  Eyes:     Pupils: Pupils are equal, round, and reactive to light.  Cardiovascular:     Rate and Rhythm: Normal rate and regular rhythm.     Heart sounds: Normal heart sounds.  Pulmonary:     Effort: Pulmonary effort is normal.     Breath sounds: Normal breath sounds.  Abdominal:     General: Bowel sounds are normal.     Palpations: Abdomen is soft.  Musculoskeletal:        General: No tenderness or deformity. Normal range of motion.     Cervical back: Normal range of motion.  Lymphadenopathy:     Cervical: No cervical adenopathy.  Skin:    General:  Skin is warm and dry.     Findings: No erythema or rash.  Neurological:     Mental Status: He is alert and oriented to person, place, and time.  Psychiatric:        Behavior: Behavior normal.        Thought Content: Thought content normal.        Judgment: Judgment normal.    Lab Results  Component Value Date   WBC 7.7 07/30/2023   HGB 9.7 (L) 07/30/2023   HCT 27.5 (L) 07/30/2023   MCV 105.0 (H) 07/30/2023   PLT 175 07/30/2023   Lab Results  Component Value Date   FERRITIN 532 (H) 04/16/2023   IRON 80 04/16/2023   TIBC 283 04/16/2023   UIBC 203 04/16/2023   IRONPCTSAT 28 04/16/2023   Lab Results  Component Value Date   RETICCTPCT 5.4 (H) 08/15/2020   RBC 2.62 (L) 07/30/2023   No results found for: "KPAFRELGTCHN", "LAMBDASER", "KAPLAMBRATIO" No results found for: "IGGSERUM", "IGA", "IGMSERUM" No results found for: "TOTALPROTELP", "ALBUMINELP", "A1GS", "A2GS", "BETS", "BETA2SER", "GAMS", "MSPIKE", "SPEI"   Chemistry      Component Value Date/Time   NA 138 07/09/2023 0833   NA 140 08/28/2019 0940   K 3.3 (L) 07/09/2023 0833   CL 104 07/09/2023 0833   CO2 25 07/09/2023 0833   BUN 16 07/09/2023 0833   BUN 21 08/28/2019 0940   CREATININE 0.91 07/09/2023 0833      Component Value Date/Time   CALCIUM 8.2 (L) 07/09/2023 0833   ALKPHOS 62 07/09/2023 0833   AST 15 07/09/2023 0833   ALT 16 07/09/2023 0833   BILITOT 1.1 07/09/2023 0833       Impression and Plan: John Douglas is a pleasant 68 yo caucasian gentleman with history  of CVA secondary to atrial fib and rectal bleeding. He was diagnosed with metastatic adenocarcinoma of the rectum -K-ras wild type/BRAF wild-type/HER-2 negative/MMR proficient. He had adenopathy distant to the rectum and now has disease recurrence.    We will now switch him over to Lonsurf.  I think this would be reasonable for him.  I think he could probably tolerate this.  It would certainly help his quality of life.  I told him that I would send  the prescription to our [facility] and that they would call him.  Hopefully, we can get him started in about a week or so.  I will plan to go ahead and get him back to see me in about 3 or 4 weeks.   Josph Macho, MD 4/1/20258:22 AM

## 2023-07-30 NOTE — Telephone Encounter (Signed)
 Oral Oncology Pharmacist Encounter  Received new prescription for Lonsurf (trifluridine-tipiracil) for the treatment of metastatic rectal cancer, planned duration until disease progression or unacceptable drug toxicity.  CBC w/ Diff and CMP from 07/30/23 assessed, no baseline dose adjustments required based on current labs. Prescription dose and frequency assessed for appropriateness.  Current medication list in Epic reviewed, no relevant/significant DDIs with Lonsurf identified.  Evaluated chart and no patient barriers to medication adherence noted.   Prescription has been e-scribed to the [facility] for benefits analysis and approval.  Oral Oncology Clinic will continue to follow for insurance authorization, copayment issues, initial counseling and start date.  Sherry Ruffing, PharmD, BCPS, BCOP Hematology/Oncology Clinical Pharmacist [facility] and [facility] Oral Chemotherapy Navigation Clinics [phone] 07/30/2023 9:50 AM

## 2023-07-30 NOTE — Patient Instructions (Signed)

## 2023-07-31 ENCOUNTER — Other Ambulatory Visit: Payer: Self-pay | Admitting: Pharmacy Technician

## 2023-07-31 ENCOUNTER — Other Ambulatory Visit: Payer: Self-pay

## 2023-07-31 ENCOUNTER — Other Ambulatory Visit (HOSPITAL_COMMUNITY): Payer: Self-pay

## 2023-07-31 NOTE — Progress Notes (Signed)
 Oral Chemotherapy Pharmacist Encounter  Patient was counseled under telephone encounter from 07/30/23.  Sherry Ruffing, PharmD, BCPS, BCOP Hematology/Oncology Clinical Pharmacist [facility] and [facility] Oral Chemotherapy Navigation Clinics [phone] 07/31/2023 1:38 PM

## 2023-07-31 NOTE — Telephone Encounter (Signed)
 Oral Chemotherapy Pharmacist Encounter    I spoke with patient for overview of: Lonsurf (trifluridine/tipiracil) for the treatment of metastatic rectal cancer, planned duration until disease progression or unacceptable drug toxicity.    Counseled patient on administration, dosing, side effects, monitoring, drug-food interactions, safe handling, storage, and disposal.   Patient will take Lonsurf 15mg  (trifluridine component) tablets, 5 tablets (75mg  trifluridine) by mouth twice daily, within 1 hour of finishing AM & PM meals, on days 1-5 and days 8-12, every 28 days.  Patient plans to take Lonsurf M-F, Saturday and Sunday off days, Lonsurf M-F again, then no tablets for the next 2 weeks. Patient will start D1 of each cycle on Mondays.  Lonsurf start date: 08/06/23   Adverse effects include but are not limited to: fatigue, nausea, vomiting, diarrhea, and decreased blood counts.    Patient states he has anti-emetic (Zofran and Compazine) on hand and knows to take it if nausea develops.   Patient will obtain anti diarrheal and alert the office of 4 or more loose stools above baseline.  Patient updated about CBC check on Cycle 1 Day 14.   Reviewed with patient importance of keeping a medication schedule and plan for any missed doses. No barriers to medication adherence identified.   Medication reconciliation performed and medication/allergy list updated.  All questions answered.  Mr. Speir voiced understanding and appreciation.    Medication education handout placed in mail for patient. Patient knows to call the office with questions or concerns. Oral Chemotherapy Clinic phone number provided to patient.    Sherry Ruffing, PharmD, BCPS, BCOP Hematology/Oncology Clinical Pharmacist [facility] and [facility] Oral Chemotherapy Navigation Clinics [phone] 07/31/2023 1:31 PM

## 2023-07-31 NOTE — Progress Notes (Signed)
 Specialty Pharmacy Initial Fill Coordination Note  John Douglas is a 68 y.o. male contacted today regarding refills of specialty medication(s) Trifluridine-Tipiracil (LONSURF) .  Patient requested Delivery  on 08/05/23  to verified address [address] [facility] [address]   Medication will be filled on 08/02/2023.   Patient is aware of $56 copayment.   Patty Almedia Balls, CPhT Oncology Pharmacy Patient Advocate [facility] Direct Number: [phone] Fax: [phone]

## 2023-08-01 ENCOUNTER — Encounter

## 2023-08-02 ENCOUNTER — Other Ambulatory Visit: Payer: Self-pay

## 2023-08-05 ENCOUNTER — Other Ambulatory Visit: Payer: Self-pay

## 2023-08-05 ENCOUNTER — Encounter: Payer: Self-pay | Admitting: Hematology & Oncology

## 2023-08-06 ENCOUNTER — Encounter: Payer: Self-pay | Admitting: Hematology & Oncology

## 2023-08-06 ENCOUNTER — Other Ambulatory Visit: Payer: Self-pay

## 2023-08-13 ENCOUNTER — Other Ambulatory Visit: Payer: Self-pay | Admitting: Internal Medicine

## 2023-08-17 ENCOUNTER — Encounter: Payer: Self-pay | Admitting: Hematology & Oncology

## 2023-08-19 ENCOUNTER — Other Ambulatory Visit (HOSPITAL_COMMUNITY): Payer: Self-pay

## 2023-08-20 ENCOUNTER — Other Ambulatory Visit: Payer: Self-pay

## 2023-08-20 ENCOUNTER — Other Ambulatory Visit (HOSPITAL_COMMUNITY): Payer: Self-pay

## 2023-08-20 NOTE — Progress Notes (Signed)
 Clinical Intervention Note  Clinical Intervention Notes: Patient called saying he ran out of medicaiton with 2 left on the next to last day and none for the last day.  I confirmed dispensed amounts with the inventory department and we verified our inventory. Inventory research confirmed that 5 bottles of 20 tablets were disepensed, which should cover his entire cycle.  I counseled the patient on directions for his next cycle.   Clinical Intervention Outcomes: Improved therapy adherence   John Douglas Specialty Pharmacist

## 2023-08-22 ENCOUNTER — Other Ambulatory Visit: Payer: Self-pay

## 2023-08-26 ENCOUNTER — Other Ambulatory Visit (HOSPITAL_COMMUNITY): Payer: Self-pay

## 2023-08-26 NOTE — Progress Notes (Signed)
 Specialty Pharmacy Refill Coordination Note  John Douglas is a 68 y.o. male contacted today regarding refills of specialty medication(s) Trifluridine -Tipiracil  (LONSURF )   Patient requested Pickup at [facility] at [place] date: 08/28/23   Medication will be filled on 08/27/23.

## 2023-08-26 NOTE — Progress Notes (Signed)
 Specialty Pharmacy Ongoing Clinical Assessment Note  John Douglas is a 68 y.o. male who is being followed by the specialty pharmacy service for [facility] Oncology   Patient's specialty medication(s) reviewed today: Trifluridine -Tipiracil  (LONSURF )   Missed doses in the last 4 weeks: 2 (all of 1 and a portion of another)   Patient/Caregiver did not have any additional questions or concerns.   Therapeutic benefit summary: Patient is achieving benefit   Adverse events/side effects summary: No adverse events/side effects   Patient's therapy is appropriate to: Continue    Goals Addressed             This Visit's Progress    Slow Disease Progression       Patient is on track. Patient will maintain adherence.  Patient's CEA has started to decline and was last 3.38 ng/mL on 07/30/23.          Follow up:  3 months  Malachi Screws Specialty Pharmacist

## 2023-08-27 ENCOUNTER — Other Ambulatory Visit: Payer: Self-pay

## 2023-08-28 ENCOUNTER — Encounter: Payer: Self-pay | Admitting: Hematology & Oncology

## 2023-08-28 ENCOUNTER — Other Ambulatory Visit (HOSPITAL_COMMUNITY): Payer: Self-pay

## 2023-08-28 ENCOUNTER — Inpatient Hospital Stay

## 2023-08-28 ENCOUNTER — Other Ambulatory Visit: Payer: Self-pay

## 2023-08-28 ENCOUNTER — Other Ambulatory Visit: Payer: Self-pay | Admitting: *Deleted

## 2023-08-28 ENCOUNTER — Inpatient Hospital Stay: Admitting: Hematology & Oncology

## 2023-08-28 ENCOUNTER — Encounter: Payer: Self-pay | Admitting: Pharmacist

## 2023-08-28 ENCOUNTER — Telehealth: Payer: Self-pay | Admitting: Pharmacist

## 2023-08-28 VITALS — BP 131/89 | HR 73 | Temp 98.6°F | Resp 18 | Wt 212.0 lb

## 2023-08-28 DIAGNOSIS — K625 Hemorrhage of anus and rectum: Secondary | ICD-10-CM

## 2023-08-28 DIAGNOSIS — C20 Malignant neoplasm of rectum: Secondary | ICD-10-CM

## 2023-08-28 DIAGNOSIS — D500 Iron deficiency anemia secondary to blood loss (chronic): Secondary | ICD-10-CM | POA: Diagnosis not present

## 2023-08-28 DIAGNOSIS — T451X5A Adverse effect of antineoplastic and immunosuppressive drugs, initial encounter: Secondary | ICD-10-CM

## 2023-08-28 DIAGNOSIS — Z95828 Presence of other vascular implants and grafts: Secondary | ICD-10-CM

## 2023-08-28 DIAGNOSIS — D701 Agranulocytosis secondary to cancer chemotherapy: Secondary | ICD-10-CM

## 2023-08-28 DIAGNOSIS — D6959 Other secondary thrombocytopenia: Secondary | ICD-10-CM

## 2023-08-28 DIAGNOSIS — D6481 Anemia due to antineoplastic chemotherapy: Secondary | ICD-10-CM

## 2023-08-28 DIAGNOSIS — K5730 Diverticulosis of large intestine without perforation or abscess without bleeding: Secondary | ICD-10-CM

## 2023-08-28 DIAGNOSIS — K521 Toxic gastroenteritis and colitis: Secondary | ICD-10-CM

## 2023-08-28 LAB — CBC WITH DIFFERENTIAL (CANCER CENTER ONLY)
Abs Immature Granulocytes: 0.03 10*3/uL (ref 0.00–0.07)
Basophils Absolute: 0 10*3/uL (ref 0.0–0.1)
Basophils Relative: 0 %
Eosinophils Absolute: 0.1 10*3/uL (ref 0.0–0.5)
Eosinophils Relative: 1 %
HCT: 20.9 % — ABNORMAL LOW (ref 39.0–52.0)
Hemoglobin: 7 g/dL — ABNORMAL LOW (ref 13.0–17.0)
Immature Granulocytes: 1 %
Lymphocytes Relative: 28 %
Lymphs Abs: 1 10*3/uL (ref 0.7–4.0)
MCH: 35.9 pg — ABNORMAL HIGH (ref 26.0–34.0)
MCHC: 33.5 g/dL (ref 30.0–36.0)
MCV: 107.2 fL — ABNORMAL HIGH (ref 80.0–100.0)
Monocytes Absolute: 0.4 10*3/uL (ref 0.1–1.0)
Monocytes Relative: 11 %
Neutro Abs: 2.2 10*3/uL (ref 1.7–7.7)
Neutrophils Relative %: 59 %
Platelet Count: 245 10*3/uL (ref 150–400)
RBC: 1.95 MIL/uL — ABNORMAL LOW (ref 4.22–5.81)
RDW: 18.7 % — ABNORMAL HIGH (ref 11.5–15.5)
WBC Count: 3.6 10*3/uL — ABNORMAL LOW (ref 4.0–10.5)
nRBC: 1.7 % — ABNORMAL HIGH (ref 0.0–0.2)

## 2023-08-28 LAB — CMP (CANCER CENTER ONLY)
ALT: 9 U/L (ref 0–44)
AST: 9 U/L — ABNORMAL LOW (ref 15–41)
Albumin: 4 g/dL (ref 3.5–5.0)
Alkaline Phosphatase: 47 U/L (ref 38–126)
Anion gap: 8 (ref 5–15)
BUN: 14 mg/dL (ref 8–23)
CO2: 27 mmol/L (ref 22–32)
Calcium: 9 mg/dL (ref 8.9–10.3)
Chloride: 107 mmol/L (ref 98–111)
Creatinine: 0.9 mg/dL (ref 0.61–1.24)
GFR, Estimated: >60 mL/min (ref >60)
Glucose, Bld: 158 mg/dL — ABNORMAL HIGH (ref 70–99)
Potassium: 3.3 mmol/L — ABNORMAL LOW (ref 3.5–5.1)
Sodium: 142 mmol/L (ref 135–145)
Total Bilirubin: 1.3 mg/dL — ABNORMAL HIGH (ref 0.0–1.2)
Total Protein: 6.8 g/dL (ref 6.5–8.1)

## 2023-08-28 LAB — IRON AND IRON BINDING CAPACITY (CC-WL,HP ONLY)
Iron: 65 ug/dL (ref 45–182)
Saturation Ratios: 23 % (ref 17.9–39.5)
TIBC: 287 ug/dL (ref 250–450)
UIBC: 222 ug/dL (ref 117–376)

## 2023-08-28 LAB — ABO/RH: ABO/RH(D): O POS

## 2023-08-28 LAB — FERRITIN: Ferritin: 572 ng/mL — ABNORMAL HIGH (ref 24–336)

## 2023-08-28 LAB — MAGNESIUM: Magnesium: 1.6 mg/dL — ABNORMAL LOW (ref 1.7–2.4)

## 2023-08-28 LAB — LACTATE DEHYDROGENASE: LDH: 222 U/L — ABNORMAL HIGH (ref 98–192)

## 2023-08-28 LAB — PREPARE RBC (CROSSMATCH)

## 2023-08-28 MED ORDER — TRIFLURIDINE-TIPIRACIL 15-6.14 MG PO TABS: 30.0000 mg/m2 | ORAL_TABLET | Freq: Two times a day (BID) | ORAL | 0 refills | Status: DC

## 2023-08-28 MED ORDER — TRIFLURIDINE-TIPIRACIL 15-6.14 MG PO TABS
30.0000 mg/m2 | ORAL_TABLET | Freq: Two times a day (BID) | ORAL | 0 refills | Status: DC
  Filled 2023-08-28: qty 80, 28d supply, fill #0
  Filled 2023-08-28: qty 80, 10d supply, fill #0
  Filled 2023-08-28: qty 80, 28d supply, fill #0

## 2023-08-28 NOTE — Progress Notes (Signed)
 Hematology and Oncology Follow Up Visit  John Douglas [id number] Sep 12, 1955 68 y.o. 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 twice daily Aspirin  81 mg p.o. daily FOLFOX/Vectibix  --start on 03/07/2023; s/p cycle #8 - d/c on 07/30/2023 Xgeva  120 mg subcu every 3 months-start 03/07/2023 - patient declined any further Xgeva . Lonsurf  75 mg po BID - start on 08/05/2023                           Interim History:  John Douglas is here today for follow-up.  He looks incredibly pale.  I can see why because his hemoglobin is down to 7.  He says he is not bleeding.  I did do a rectal exam on him.  His stool was light brown but yet it was heme positive.  He is on Eliquis  because of the atrial fibrillation and history of CVA.  As such I do not think we can get him off the Eliquis .  He is going need to have be seen by gastroenterology an probably have a colonoscopy to see what is going on.  Again he needs to be transfused.  I talked him about transfusion.  I told him that transfusions are safe.  He is not going to get AIDS or hepatitis.  I know that he is a little bit leery about transfusion.  However, since there is some bleeding, I really think he is going to benefit from a transfusion.  I also think that the Lonsurf  is part of the problem.  His dose probably needs to be decreased a little bit.  He says he takes 5 pills twice a day.  By doing go down to 4 pills twice a day.  He is not to take Lonsurf  for a couple  of weeks.  We really need to get his blood counts up a little bit.  His last CEA level was 3.4.  He has had a decent appetite.  He has had no cough or shortness of breath.  There is been no fever.  He has had no urinary issues.  He does take a diuretic so he does urinate frequently.  Of note, his last PET scan was done on 07/15/2023.  This all looks fine without any obvious recurrent disease seen on the scan.  Currently, I would have to say that his performance status is probably ECOG 1.   Medications:  Allergies as of 08/28/2023       Reactions   Celexa  [citalopram ] Other (See Comments)   Contraindicated with A-Fib   Ibuprofen Other (See Comments)   Was told to not take this    Metformin And Related Other (See Comments)   Bloody stools    Naproxen Other (See Comments)   Was told to not take this    Yellow Jacket Venom [bee Venom] Swelling, Other (See Comments)   Severe swelling where stung   Penicillins Hives   Did it involve swelling of the face/tongue/throat, SOB, or low BP? Unk Did it involve sudden or severe rash/hives, skin peeling, or  any reaction on the inside of your mouth or nose? Yes Did you need to seek medical attention at a hospital or doctor's office? Unk When did it last happen? "Childhood- 55 years ago" If all above answers are "NO", may proceed with cephalosporin use.   Xgeva  [denosumab ] Other (See Comments)   Runny and bloody nose, lower back pain, watery discharge from eyes, sinuses were raw feeling        Medication List        Accurate as of August 28, 2023 10:56 AM. If you have any questions, ask your nurse or doctor.          diltiazem  240 MG 24 hr capsule Commonly known as: CARDIZEM  CD TAKE 1 CAPSULE BY MOUTH EVERY DAY   Eliquis  5 MG Tabs tablet Generic drug: apixaban  TAKE 1 TABLET BY MOUTH TWICE A DAY   ezetimibe  10 MG tablet Commonly known as: ZETIA  Take 1 tablet by mouth once daily   fluticasone  50 MCG/ACT nasal spray Commonly known  as: Flonase  Place 1 spray into both nostrils 2 (two) times daily as needed (sinus congestion).   furosemide  40 MG tablet Commonly known as: LASIX  TAKE 1 TABLET BY MOUTH EVERY DAY   glimepiride  1 MG tablet Commonly known as: AMARYL  Take 1 mg by mouth daily.   HYDROcodone -acetaminophen  5-325 MG tablet Commonly known as: Norco Take 1 tablet by mouth every 6 (six) hours as needed for moderate pain (pain score 4-6).   Lonsurf  15-6.14 MG tablet Generic drug: trifluridine -tipiracil  Take 5 tablets (75 mg of trifluridine  total) by mouth 2 (two) times daily after a meal. Take within 1 hr after AM & PM meals on days 1-5, 8-12. Repeat every 28 days.   metoprolol  succinate 50 MG 24 hr tablet Commonly known as: TOPROL -XL TAKE 1 TABLET BY MOUTH EVERY DAY   mupirocin  ointment 2 % Commonly known as: BACTROBAN  Apply 1 Application topically 3 (three) times daily. Apply to pinky that has the infection.   nitroGLYCERIN  0.4 MG SL tablet Commonly known as: NITROSTAT  Place 1 tablet (0.4 mg total) under the tongue every 5 (five) minutes as needed for chest pain.   potassium chloride  SA 20 MEQ tablet Commonly known as: Klor-Con  M20 Take 2 tablets (40 mEq total) by mouth daily.   pramoxine-hydrocortisone  1-1 % rectal cream Commonly known as: PROCTOCREAM-HC Place 1 application  rectally 2 (two) times daily as needed for hemorrhoids or anal itching.   sulfacetamide  10 % ophthalmic solution Commonly known as: Bleph -10 Place 1 drop into both eyes every 4 (four) hours.        Allergies:  Allergies  Allergen Reactions   Celexa  [Citalopram ] Other (See Comments)    Contraindicated with A-Fib   Ibuprofen Other (See Comments)    Was told to not take this    Metformin And Related Other (See Comments)    Bloody stools    Naproxen Other (See Comments)    Was told to not take this    Yellow Jacket Venom [Bee Venom] Swelling and Other (See Comments)    Severe swelling where stung   Penicillins  Hives    Did it involve swelling of the face/tongue/throat, SOB, or low BP? Unk Did it involve sudden or severe rash/hives, skin peeling, or any reaction on the inside of your mouth or nose? Yes Did you need to seek medical attention at a hospital or doctor's office? Unk When did it last happen? "Childhood- 55 years ago" If all above answers are "NO", may  proceed with cephalosporin use.    Xgeva  [Denosumab ] Other (See Comments)    Runny and bloody nose, lower back pain, watery discharge from eyes, sinuses were raw feeling    Past Medical History, Surgical history, Social history, and Family History were reviewed and updated.  Review of Systems: Review of Systems  Constitutional: Negative.   HENT: Negative.    Eyes: Negative.   Respiratory: Negative.    Cardiovascular: Negative.   Gastrointestinal: Negative.   Genitourinary: Negative.   Musculoskeletal: Negative.   Skin: Negative.   Neurological: Negative.   Endo/Heme/Allergies: Negative.   Psychiatric/Behavioral: Negative.       Physical Exam:  weight is 212 lb (96.2 kg). His oral temperature is 98.6 F (37 C). His blood pressure is 131/89 and his pulse is 73. His respiration is 18 and oxygen saturation is 100%.   Wt Readings from Last 3 Encounters:  08/28/23 212 lb (96.2 kg)  07/30/23 195 lb (88.5 kg)  07/09/23 207 lb (93.9 kg)    Physical Exam Vitals reviewed.  HENT:     Head: Normocephalic and atraumatic.  Eyes:     Pupils: Pupils are equal, round, and reactive to light.  Cardiovascular:     Rate and Rhythm: Normal rate and regular rhythm.     Heart sounds: Normal heart sounds.  Pulmonary:     Effort: Pulmonary effort is normal.     Breath sounds: Normal breath sounds.  Abdominal:     General: Bowel sounds are normal.     Palpations: Abdomen is soft.  Musculoskeletal:        General: No tenderness or deformity. Normal range of motion.     Cervical back: Normal range of motion.  Lymphadenopathy:      Cervical: No cervical adenopathy.  Skin:    General: Skin is warm and dry.     Findings: No erythema or rash.  Neurological:     Mental Status: He is alert and oriented to person, place, and time.  Psychiatric:        Behavior: Behavior normal.        Thought Content: Thought content normal.        Judgment: Judgment normal.    Lab Results  Component Value Date   WBC 3.6 (L) 08/28/2023   HGB 7.0 (L) 08/28/2023   HCT 20.9 (L) 08/28/2023   MCV 107.2 (H) 08/28/2023   PLT 245 08/28/2023   Lab Results  Component Value Date   FERRITIN 532 (H) 04/16/2023   IRON 80 04/16/2023   TIBC 283 04/16/2023   UIBC 203 04/16/2023   IRONPCTSAT 28 04/16/2023   Lab Results  Component Value Date   RETICCTPCT 5.4 (H) 08/15/2020   RBC 1.95 (L) 08/28/2023   No results found for: "KPAFRELGTCHN", "LAMBDASER", "KAPLAMBRATIO" No results found for: "IGGSERUM", "IGA", "IGMSERUM" No results found for: "TOTALPROTELP", "ALBUMINELP", "A1GS", "A2GS", "BETS", "BETA2SER", "GAMS", "MSPIKE", "SPEI"   Chemistry      Component Value Date/Time   NA 142 08/28/2023 0940   NA 140 08/28/2019 0940   K 3.3 (L) 08/28/2023 0940   CL 107 08/28/2023 0940   CO2 27 08/28/2023 0940   BUN 14 08/28/2023 0940   BUN 21 08/28/2019 0940   CREATININE 0.90 08/28/2023 0940      Component Value Date/Time   CALCIUM  9.0 08/28/2023 0940   ALKPHOS 47 08/28/2023 0940   AST 9 (L) 08/28/2023 0940   ALT 9 08/28/2023 0940   BILITOT 1.3 (H) 08/28/2023 0940  Impression and Plan: John Douglas is a pleasant 68 yo caucasian gentleman with history of CVA secondary to atrial fib and rectal bleeding. He was diagnosed with metastatic adenocarcinoma of the rectum -K-ras wild type/BRAF wild-type/HER-2 negative/MMR proficient. He had adenopathy distant to the rectum and now has disease recurrence.    The problem now is that he is quite anemic.  He is having some GI bleeding.  Granted, is not a lot but yet the stool is clearly  positive.  He does have some small hemorrhoids but I do not think that the source of this blood loss.  We will have to get him over to gastroenterology.  They will have to scope him.  Again he will hold off on the Lonsurf .  I have I will have him hold off on Lonsurf  for couple weeks.  He will decrease the dose from 5 pills twice a day down to 4 pills twice a day.  Will have to get him back probably a little more quickly than typical so that we can monitor his blood counts.   Ivor Mars, MD 4/30/202510:56 AM

## 2023-08-28 NOTE — Patient Instructions (Signed)

## 2023-08-28 NOTE — Telephone Encounter (Signed)
 Oral Chemotherapy Pharmacist Encounter   Received phone call from Dr. Maria Shiner regarding patient's cytopenias secondary to Lonsurf  (WBC 3.6 K/uL, down from 7.7 K/uL; Hgb 7 g/dL, down from 9.7 g/dL).  Patient will be dose reduced to 4 tablets (60 mg trifluridine  component) 2 times daily on days 1-5, 8-12, every 28 days. Patient will not start next cycle per Dr. Maria Shiner for 2 weeks.   Updated Rx verbally read back to Dr. Maria Shiner, cosigned and sent to pharmacy for dispensing.   Jude Norton, PharmD, BCPS, BCOP Hematology/Oncology Clinical Pharmacist [facility] and [facility] Oral Chemotherapy [facility] Clin[facility] [phone] 08/28/2023 2:15 PM

## 2023-08-28 NOTE — Progress Notes (Signed)
 Specialty Pharmacy Refill Coordination Note  John Douglas is a 68 y.o. male contacted today regarding refills of specialty medication(s) Trifluridine -Tipiracil  (LONSURF )   Patient requested Pickup at [facility] at [place] date: 08/29/23   Medication will be filled on 08/29/23.    Patient has an appt on 05/01 and would like to pick up afterwards. Patient is aware that we will have to order medication and should be at [facility] after lunchtime. Messaged Sandi to put on order.

## 2023-08-28 NOTE — Telephone Encounter (Signed)
 Error

## 2023-08-29 ENCOUNTER — Other Ambulatory Visit: Payer: Self-pay

## 2023-08-29 ENCOUNTER — Other Ambulatory Visit (HOSPITAL_COMMUNITY): Payer: Self-pay

## 2023-08-29 ENCOUNTER — Inpatient Hospital Stay: Attending: Hematology & Oncology

## 2023-08-29 DIAGNOSIS — Z7901 Long term (current) use of anticoagulants: Secondary | ICD-10-CM | POA: Diagnosis not present

## 2023-08-29 DIAGNOSIS — C20 Malignant neoplasm of rectum: Secondary | ICD-10-CM | POA: Insufficient documentation

## 2023-08-29 DIAGNOSIS — D649 Anemia, unspecified: Secondary | ICD-10-CM | POA: Insufficient documentation

## 2023-08-29 DIAGNOSIS — Z452 Encounter for adjustment and management of vascular access device: Secondary | ICD-10-CM | POA: Diagnosis not present

## 2023-08-29 DIAGNOSIS — I4891 Unspecified atrial fibrillation: Secondary | ICD-10-CM | POA: Insufficient documentation

## 2023-08-29 DIAGNOSIS — Z8673 Personal history of transient ischemic attack (TIA), and cerebral infarction without residual deficits: Secondary | ICD-10-CM | POA: Diagnosis not present

## 2023-08-29 DIAGNOSIS — D500 Iron deficiency anemia secondary to blood loss (chronic): Secondary | ICD-10-CM

## 2023-08-29 MED ORDER — SODIUM CHLORIDE 0.9% IV SOLUTION: 250.0000 mL | INTRAVENOUS | Status: DC

## 2023-08-29 MED ORDER — DIPHENHYDRAMINE HCL 25 MG PO CAPS
25.0000 mg | ORAL_CAPSULE | Freq: Once | ORAL | Status: AC
  Administered 2023-08-29: 25 mg via ORAL

## 2023-08-29 MED ORDER — HEPARIN SOD (PORK) LOCK FLUSH 100 UNIT/ML IV SOLN: 500.0000 [IU] | Freq: Every day | INTRAVENOUS | Status: DC | PRN

## 2023-08-29 MED ORDER — ACETAMINOPHEN 325 MG PO TABS
650.0000 mg | ORAL_TABLET | Freq: Once | ORAL | Status: AC
  Administered 2023-08-29: 650 mg via ORAL

## 2023-08-29 MED ORDER — SODIUM CHLORIDE 0.9% FLUSH
10.0000 mL | INTRAVENOUS | Status: DC | PRN
Start: 2023-08-29 — End: 2023-08-29

## 2023-08-29 NOTE — Patient Instructions (Signed)
 Blood Transfusion, Adult A blood transfusion is a procedure in which you receive blood or a type of blood cell (blood component) through an IV. You may need a blood transfusion when you have a low blood count, which is a low number of any blood cell. This may result from a bleeding disorder, illness, injury, or surgery. The blood may come from a donor, or you may be able to have your own blood collected and stored (autologous blood donation) before a planned surgery. The blood given in a transfusion may be made up of different blood components. You may receive: Red blood cells. These carry oxygen to the cells in the body. Platelets. These help your blood to clot. Plasma. This is the liquid part of your blood. It carries proteins and other substances throughout the body. White blood cells. These help you fight infections. If you have hemophilia or another clotting disorder, you may also receive other types of blood products. Depending on the type of blood product, this procedure may take 1-4 hours to complete. Tell a health care provider about: Any bleeding problems you have. Any previous reactions you have had during a blood transfusion. Any allergies you have. All medicines you are taking, including vitamins, herbs, eye drops, creams, and over-the-counter medicines. Any surgeries you have had. Any medical conditions you have. Whether you are pregnant or may be pregnant. What are the risks? Talk with your health care provider about risks. The most common problems include: A mild allergic reaction, such as red, swollen areas of skin (hives) and itching. Fever or chills. This may be the body's response to new blood cells received. This may occur during or up to 4 hours after the transfusion. More serious problems may include: A serious allergic reaction that causes difficulty breathing or swelling around the face and lips. Transfusion-associated circulatory overload (TACO), or too much fluid in  the lungs. This may cause breathing problems. Transfusion-related acute lung injury (TRALI), which causes breathing difficulty and low oxygen in the blood. This can occur within hours of the transfusion or several days later. Iron overload. This can happen after receiving many blood transfusions over a period of time. Infection or virus being transmitted. This is rare because donated blood is carefully tested before it is given. Hemolytic transfusion reaction. This is rare. It happens when the body's defense system (immune system)tries to attack the new blood cells. Symptoms may include fever, chills, nausea, low blood pressure, and low back or chest pain. Transfusion-associated graft-versus-host disease (TAGVHD). This is rare. It happens when donated cells attack the body's healthy tissues. What happens before the procedure? You will have a blood test to check your blood type. This test is done to know what kind of blood your body will accept and to match it to the donor blood. If you are going to have a planned surgery, you may be able to do an autologous blood donation. This may be done in case you need to have a transfusion. You will have your temperature, blood pressure, and pulse checked before the transfusion. If you have had an allergic reaction to a transfusion in the past, you may be given medicine to help prevent a reaction. This medicine may be given to you by mouth (orally) or through an IV. What happens during the procedure?  An IV will be inserted into one of your veins. The bag of blood will be attached to your IV. The blood will then enter through your vein. Your temperature, blood pressure,  and pulse will be monitored during the transfusion. This monitoring is done to detect early signs of a transfusion reaction. Tell your nurse right away if you have any of these symptoms during the transfusion: Shortness of breath or trouble breathing. Chest or back pain. Fever or  chills. Itching or hives. If you have any signs or symptoms of a reaction, your transfusion will be stopped and you may be given medicine. When the transfusion is complete, your IV will be removed. Pressure may be applied to the IV site for a few minutes. A bandage (dressing)will be applied. The procedure may vary among health care providers and hospitals. What happens after the procedure? Your temperature, blood pressure, pulse, breathing rate, and blood oxygen level will be monitored until you leave the hospital or clinic. Your blood may be tested to see how you have responded to the transfusion. You may be warmed with fluids or blankets to maintain a normal body temperature. If you receive your blood transfusion in an outpatient setting, you will be told whom to contact to report any reactions. Where to find more information Visit the [employer]: redcross.org Summary A blood transfusion is a procedure in which you receive blood or a type of blood cell (blood component) through an IV. The blood given in a transfusion may be made up of different blood components. You may receive red blood cells, platelets, plasma, or white blood cells depending on the condition treated. Your temperature, blood pressure, and pulse will be monitored before, during, and after the transfusion. After the transfusion, your blood may be tested to see how your body has responded. This information is not intended to replace advice given to you by your health care provider. Make sure you discuss any questions you have with your health care provider. Document Revised: 07/14/2021 Document Reviewed: 07/14/2021 Elsevier Patient Education  2024 [employer].

## 2023-08-29 NOTE — Progress Notes (Signed)
 Patient took his Tylenol  and Benadryl  at home at 10:15 AM today.

## 2023-08-30 LAB — TYPE AND SCREEN
ABO/RH(D): O POS
Antibody Screen: NEGATIVE
Unit division: 0
Unit division: 0

## 2023-08-30 LAB — BPAM RBC
Blood Product Expiration Date: 202506022359
Blood Product Expiration Date: 202506032359
ISSUE DATE / TIME: 202505010729
ISSUE DATE / TIME: 202505010729
Unit Type and Rh: 5100
Unit Type and Rh: 5100

## 2023-09-02 ENCOUNTER — Telehealth: Payer: Self-pay

## 2023-09-02 NOTE — Telephone Encounter (Signed)
 I called the patient to offer him a new patient appointment with Deanna May, NP on 09-09-23 with the plan to hold an ECL spot on 09-11-23 with Dr Karene Oto.  When I contacted the patient by phone to discuss the plan, he immediately cut me off and stated, " I am going to decline this appointment.  I have no plans to hold Eliquis  because the last time I stopped my Eliquis , I had a stroke. I have discussed this with Dr Maria Shiner and I do not wish to schedule an appointment with your office."  Patient advised the importance of the procedure, but again refused.  Will make Dr Karene Oto and Dr Maria Shiner aware of the patient's decision.

## 2023-09-02 NOTE — Telephone Encounter (Signed)
-----   Message from Annis Kinder sent at 08/30/2023  5:17 PM EDT ----- I received a call from Dr. Maria Shiner in the Oncology Clinic.  This patient needs an expedited appointment in the GI clinic for anemia and heme positive stool.  He is otherwise hemodynamically stable and does not require hospital admission, but does need to be seen on an expedited fashion.  I am on inpatient all of next week and cannot work him in.  Can you see if one of the APP's have any availability next week.  Can otherwise put him on my schedule for the following week for EGD/colonoscopy as an add-on, unless somebody else has availability in the interim. Thanks!

## 2023-09-16 ENCOUNTER — Other Ambulatory Visit: Payer: Self-pay

## 2023-09-16 NOTE — Progress Notes (Signed)
 On return to stock list from [facility] called & Spoke with patient & Medication on hold Have appt 5/23 with Md Ennever. Advise to give us  a call after appt with Update. Patient states MD will give us  a call.

## 2023-09-17 ENCOUNTER — Other Ambulatory Visit: Payer: Self-pay

## 2023-09-17 ENCOUNTER — Other Ambulatory Visit (HOSPITAL_COMMUNITY): Payer: Self-pay

## 2023-09-20 ENCOUNTER — Inpatient Hospital Stay

## 2023-09-20 ENCOUNTER — Other Ambulatory Visit: Payer: Self-pay | Admitting: *Deleted

## 2023-09-20 ENCOUNTER — Inpatient Hospital Stay (HOSPITAL_BASED_OUTPATIENT_CLINIC_OR_DEPARTMENT_OTHER): Admitting: Hematology & Oncology

## 2023-09-20 ENCOUNTER — Encounter: Payer: Self-pay | Admitting: Hematology & Oncology

## 2023-09-20 ENCOUNTER — Other Ambulatory Visit: Payer: Self-pay

## 2023-09-20 DIAGNOSIS — D500 Iron deficiency anemia secondary to blood loss (chronic): Secondary | ICD-10-CM

## 2023-09-20 DIAGNOSIS — C20 Malignant neoplasm of rectum: Secondary | ICD-10-CM

## 2023-09-20 DIAGNOSIS — K625 Hemorrhage of anus and rectum: Secondary | ICD-10-CM

## 2023-09-20 DIAGNOSIS — D6959 Other secondary thrombocytopenia: Secondary | ICD-10-CM

## 2023-09-20 DIAGNOSIS — D701 Agranulocytosis secondary to cancer chemotherapy: Secondary | ICD-10-CM

## 2023-09-20 DIAGNOSIS — D6481 Anemia due to antineoplastic chemotherapy: Secondary | ICD-10-CM

## 2023-09-20 DIAGNOSIS — Z95828 Presence of other vascular implants and grafts: Secondary | ICD-10-CM

## 2023-09-20 DIAGNOSIS — K5730 Diverticulosis of large intestine without perforation or abscess without bleeding: Secondary | ICD-10-CM

## 2023-09-20 DIAGNOSIS — K521 Toxic gastroenteritis and colitis: Secondary | ICD-10-CM

## 2023-09-20 LAB — CBC WITH DIFFERENTIAL (CANCER CENTER ONLY)
Abs Immature Granulocytes: 0.17 10*3/uL — ABNORMAL HIGH (ref 0.00–0.07)
Basophils Absolute: 0.1 10*3/uL (ref 0.0–0.1)
Basophils Relative: 1 %
Eosinophils Absolute: 0.2 10*3/uL (ref 0.0–0.5)
Eosinophils Relative: 1 %
HCT: 33.2 % — ABNORMAL LOW (ref 39.0–52.0)
Hemoglobin: 10.9 g/dL — ABNORMAL LOW (ref 13.0–17.0)
Immature Granulocytes: 1 %
Lymphocytes Relative: 9 %
Lymphs Abs: 1.1 10*3/uL (ref 0.7–4.0)
MCH: 32.4 pg (ref 26.0–34.0)
MCHC: 32.8 g/dL (ref 30.0–36.0)
MCV: 98.8 fL (ref 80.0–100.0)
Monocytes Absolute: 0.8 10*3/uL (ref 0.1–1.0)
Monocytes Relative: 6 %
Neutro Abs: 10 10*3/uL — ABNORMAL HIGH (ref 1.7–7.7)
Neutrophils Relative %: 82 %
Platelet Count: 252 10*3/uL (ref 150–400)
RBC: 3.36 MIL/uL — ABNORMAL LOW (ref 4.22–5.81)
RDW: 17.6 % — ABNORMAL HIGH (ref 11.5–15.5)
WBC Count: 12.2 10*3/uL — ABNORMAL HIGH (ref 4.0–10.5)
nRBC: 0 % (ref 0.0–0.2)

## 2023-09-20 LAB — FERRITIN: Ferritin: 652 ng/mL — ABNORMAL HIGH (ref 24–336)

## 2023-09-20 LAB — RETICULOCYTES
Immature Retic Fract: 14.9 % (ref 2.3–15.9)
RBC.: 3.35 MIL/uL — ABNORMAL LOW (ref 4.22–5.81)
Retic Count, Absolute: 155.8 10*3/uL (ref 19.0–186.0)
Retic Ct Pct: 4.7 % — ABNORMAL HIGH (ref 0.4–3.1)

## 2023-09-20 LAB — CMP (CANCER CENTER ONLY)
ALT: 12 U/L (ref 0–44)
AST: 9 U/L — ABNORMAL LOW (ref 15–41)
Albumin: 4.4 g/dL (ref 3.5–5.0)
Alkaline Phosphatase: 61 U/L (ref 38–126)
Anion gap: 8 (ref 5–15)
BUN: 26 mg/dL — ABNORMAL HIGH (ref 8–23)
CO2: 29 mmol/L (ref 22–32)
Calcium: 9 mg/dL (ref 8.9–10.3)
Chloride: 102 mmol/L (ref 98–111)
Creatinine: 1.04 mg/dL (ref 0.61–1.24)
GFR, Estimated: >60 mL/min (ref >60)
Glucose, Bld: 233 mg/dL — ABNORMAL HIGH (ref 70–99)
Potassium: 3.5 mmol/L (ref 3.5–5.1)
Sodium: 139 mmol/L (ref 135–145)
Total Bilirubin: 0.9 mg/dL (ref 0.0–1.2)
Total Protein: 6.8 g/dL (ref 6.5–8.1)

## 2023-09-20 LAB — CEA (ACCESS): CEA (CHCC): 2.11 ng/mL (ref 0.00–5.00)

## 2023-09-20 MED ORDER — SODIUM CHLORIDE 0.9% FLUSH
10.0000 mL | Freq: Once | INTRAVENOUS | Status: AC
  Administered 2023-09-20: 10 mL via INTRAVENOUS

## 2023-09-20 MED ORDER — TRIFLURIDINE-TIPIRACIL 15-6.14 MG PO TABS
30.0000 mg/m2 | ORAL_TABLET | Freq: Two times a day (BID) | ORAL | 4 refills | Status: DC
Start: 2023-09-20 — End: 2024-01-21
  Filled 2023-09-24: qty 80, 28d supply, fill #0
  Filled 2023-10-15: qty 80, 28d supply, fill #1
  Filled 2023-11-11: qty 80, 28d supply, fill #2
  Filled 2023-12-12: qty 80, 28d supply, fill #3

## 2023-09-20 MED ORDER — HEPARIN SOD (PORK) LOCK FLUSH 100 UNIT/ML IV SOLN
500.0000 [IU] | Freq: Once | INTRAVENOUS | Status: AC
  Administered 2023-09-20: 500 [IU] via INTRAVENOUS

## 2023-09-20 NOTE — Progress Notes (Signed)
 Hematology and Oncology Follow Up Visit  John Douglas [id number] 1955-05-08 68 y.o. 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 twice daily Aspirin  81 mg p.o. daily FOLFOX/Vectibix  --start on 03/07/2023; s/p cycle #8 - d/c on 07/30/2023 Xgeva  120 mg subcu every 3 months-start 03/07/2023 - patient declined any further Xgeva . Lonsurf  60 mg po BID - start on 08/05/2023 -- changed on 09/20/2023                           Interim History:  John Douglas is here today for follow-up.  He is doing much better now.  He is no longer bleeding.  More we had last seen him, he was having GI bleeding.  Unfortunate, he would not undergo a colonoscopy because he did not want to go off his Eliquis .  He feels better.  He has more energy now.  He said he still has a little bit of bleeding from what he thinks is hemorrhoids.  Were going to adjust his Lonsurf  dose down a little bit.  We will have him take form pills twice a day instead of 5 pills twice a day.  He has had no problems with cough.  He has had no problems with nausea or vomiting.  His blood sugars still are on the higher side.  He has had no issues with leg swelling.  He has had no rashes.  He has had no headache.  There has been no mouth sores.  Of note, his last CEA was 3.38.  Surprisingly, when we last saw him, his ferritin was 572 with an iron saturation of 23%.  I am happy that his quality life is better right now.  Currently, I would say his  performance status is probably ECOG 1.   Medications:  Allergies as of 09/20/2023       Reactions   Celexa  [citalopram ] Other (See Comments)   Contraindicated with A-Fib   Ibuprofen Other (See Comments)   Was told to not take this    Metformin And Related Other (See Comments)   Bloody stools    Naproxen Other (See Comments)   Was told to not take this    Yellow Jacket Venom [bee Venom] Swelling, Other (See Comments)   Severe swelling where stung   Penicillins Hives   Did it involve swelling of the face/tongue/throat, SOB, or low BP? Unk Did it involve sudden or severe rash/hives, skin peeling, or any reaction on the inside of your mouth or nose? Yes Did you need to seek medical attention at a hospital or doctor's office? Unk When did it last happen? "Childhood- 55 years ago" If all above answers are "NO", may proceed with cephalosporin use.   Xgeva  [denosumab ] Other (See Comments)   Runny and bloody nose, lower back pain, watery discharge from eyes, sinuses were raw feeling        Medication List        Accurate as of Sep 20, 2023  4:20 PM. If you have any questions, ask your nurse or doctor.  diltiazem  240 MG 24 hr capsule Commonly known as: CARDIZEM  CD TAKE 1 CAPSULE BY MOUTH EVERY DAY   Eliquis  5 MG Tabs tablet Generic drug: apixaban  TAKE 1 TABLET BY MOUTH TWICE A DAY   ezetimibe  10 MG tablet Commonly known as: ZETIA  Take 1 tablet by mouth once daily   fluticasone  50 MCG/ACT nasal spray Commonly known as: Flonase  Place 1 spray into both nostrils 2 (two) times daily as needed (sinus congestion).   furosemide  40 MG tablet Commonly known as: LASIX  TAKE 1 TABLET BY MOUTH EVERY DAY   glimepiride  1 MG tablet Commonly known as: AMARYL  Take 1 mg by mouth daily.   HYDROcodone -acetaminophen  5-325 MG tablet Commonly known as: Norco Take 1 tablet by mouth every 6 (six) hours as needed for moderate pain (pain score 4-6).   metoprolol  succinate 50 MG 24  hr tablet Commonly known as: TOPROL -XL TAKE 1 TABLET BY MOUTH EVERY DAY   mupirocin  ointment 2 % Commonly known as: BACTROBAN  Apply 1 Application topically 3 (three) times daily. Apply to pinky that has the infection.   nitroGLYCERIN  0.4 MG SL tablet Commonly known as: NITROSTAT  Place 1 tablet (0.4 mg total) under the tongue every 5 (five) minutes as needed for chest pain.   potassium chloride  SA 20 MEQ tablet Commonly known as: Klor-Con  M20 Take 2 tablets (40 mEq total) by mouth daily.   pramoxine-hydrocortisone  1-1 % rectal cream Commonly known as: PROCTOCREAM-HC Place 1 application  rectally 2 (two) times daily as needed for hemorrhoids or anal itching.   sulfacetamide  10 % ophthalmic solution Commonly known as: Bleph -10 Place 1 drop into both eyes every 4 (four) hours.   trifluridine -tipiracil  15-6.14 MG tablet Commonly known as: LONSURF  Take 4 tablets (60 mg of trifluridine  total) by mouth 2 (two) times daily after a meal. Take within 1 hr after AM & PM meals on days 1-5, 8-12. Repeat every 28 days.        Allergies:  Allergies  Allergen Reactions   Celexa  [Citalopram ] Other (See Comments)    Contraindicated with A-Fib   Ibuprofen Other (See Comments)    Was told to not take this    Metformin And Related Other (See Comments)    Bloody stools    Naproxen Other (See Comments)    Was told to not take this    Yellow Jacket Venom [Bee Venom] Swelling and Other (See Comments)    Severe swelling where stung   Penicillins Hives    Did it involve swelling of the face/tongue/throat, SOB, or low BP? Unk Did it involve sudden or severe rash/hives, skin peeling, or any reaction on the inside of your mouth or nose? Yes Did you need to seek medical attention at a hospital or doctor's office? Unk When did it last happen? "Childhood- 55 years ago" If all above answers are "NO", may proceed with cephalosporin use.    Xgeva  [Denosumab ] Other (See Comments)    Runny and bloody  nose, lower back pain, watery discharge from eyes, sinuses were raw feeling    Past Medical History, Surgical history, Social history, and Family History were reviewed and updated.  Review of Systems: Review of Systems  Constitutional: Negative.   HENT: Negative.    Eyes: Negative.   Respiratory: Negative.    Cardiovascular: Negative.   Gastrointestinal: Negative.   Genitourinary: Negative.   Musculoskeletal: Negative.   Skin: Negative.   Neurological: Negative.   Endo/Heme/Allergies: Negative.   Psychiatric/Behavioral: Negative.       Physical Exam:  weight is 215 lb (97.5 kg). His oral temperature is 98.5 F (36.9 C). His blood pressure is 158/88 (abnormal) and his pulse is 74. His respiration is 17 and oxygen saturation is 100%.   Wt Readings from Last 3 Encounters:  09/20/23 215 lb (97.5 kg)  08/28/23 212 lb (96.2 kg)  07/30/23 195 lb (88.5 kg)    Physical Exam Vitals reviewed.  HENT:     Head: Normocephalic and atraumatic.  Eyes:     Pupils: Pupils are equal, round, and reactive to light.  Cardiovascular:     Rate and Rhythm: Normal rate and regular rhythm.     Heart sounds: Normal heart sounds.  Pulmonary:     Effort: Pulmonary effort is normal.     Breath sounds: Normal breath sounds.  Abdominal:     General: Bowel sounds are normal.     Palpations: Abdomen is soft.  Musculoskeletal:        General: No tenderness or deformity. Normal range of motion.     Cervical back: Normal range of motion.  Lymphadenopathy:     Cervical: No cervical adenopathy.  Skin:    General: Skin is warm and dry.     Findings: No erythema or rash.  Neurological:     Mental Status: He is alert and oriented to person, place, and time.  Psychiatric:        Behavior: Behavior normal.        Thought Content: Thought content normal.        Judgment: Judgment normal.     Lab Results  Component Value Date   WBC 12.2 (H) 09/20/2023   HGB 10.9 (L) 09/20/2023   HCT 33.2 (L)  09/20/2023   MCV 98.8 09/20/2023   PLT 252 09/20/2023   Lab Results  Component Value Date   FERRITIN 572 (H) 08/28/2023   IRON 65 08/28/2023   TIBC 287 08/28/2023   UIBC 222 08/28/2023   IRONPCTSAT 23 08/28/2023   Lab Results  Component Value Date   RETICCTPCT 4.7 (H) 09/20/2023   RBC 3.36 (L) 09/20/2023   RBC 3.35 (L) 09/20/2023   No results found for: "KPAFRELGTCHN", "LAMBDASER", "KAPLAMBRATIO" No results found for: "IGGSERUM", "IGA", "IGMSERUM" No results found for: "TOTALPROTELP", "ALBUMINELP", "A1GS", "A2GS", "BETS", "BETA2SER", "GAMS", "MSPIKE", "SPEI"   Chemistry      Component Value Date/Time   NA 139 09/20/2023 1503   NA 140 08/28/2019 0940   K 3.5 09/20/2023 1503   CL 102 09/20/2023 1503   CO2 29 09/20/2023 1503   BUN 26 (H) 09/20/2023 1503   BUN 21 08/28/2019 0940   CREATININE 1.04 09/20/2023 1503      Component Value Date/Time   CALCIUM  9.0 09/20/2023 1503   ALKPHOS 61 09/20/2023 1503   AST 9 (L) 09/20/2023 1503   ALT 12 09/20/2023 1503   BILITOT 0.9 09/20/2023 1503       Impression and Plan: John Douglas is a pleasant 68 yo caucasian gentleman with history of CVA secondary to atrial fib and rectal bleeding. He was diagnosed with metastatic adenocarcinoma of the rectum -K-ras wild type/BRAF wild-type/HER-2 negative/MMR proficient. He had adenopathy distant to the rectum and now has disease recurrence.    I think the anemia is gotten better.  As such, we can get him back onto the Lonsurf .  Again, we will adjust his dose down to 4 pills twice a day.  He will need to have another PET scan.  I will set this up for June.  We will see what his CEA level is now.  Again this is all about quality of life.  I need to make sure that we focus on his quality of life.  I will plan to see him back in about a month.  He is quite busy.  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  This is a very busy time that he  wants to help out his family so that the wedding facility is utilized.   Ivor Mars, MD 5/23/20254:20 PM

## 2023-09-22 ENCOUNTER — Other Ambulatory Visit: Payer: Self-pay

## 2023-09-23 ENCOUNTER — Encounter: Payer: Self-pay | Admitting: Hematology & Oncology

## 2023-09-24 ENCOUNTER — Other Ambulatory Visit: Payer: Self-pay

## 2023-09-24 ENCOUNTER — Other Ambulatory Visit (HOSPITAL_COMMUNITY): Payer: Self-pay

## 2023-09-24 LAB — IRON AND IRON BINDING CAPACITY (CC-WL,HP ONLY)
Iron: 77 ug/dL (ref 45–182)
Saturation Ratios: 24 % (ref 17.9–39.5)
TIBC: 315 ug/dL (ref 250–450)
UIBC: 238 ug/dL (ref 117–376)

## 2023-09-24 NOTE — Progress Notes (Signed)
 Specialty Pharmacy Refill Coordination Note  John Douglas is a 69 y.o. male contacted today regarding refills of specialty medication(s) Trifluridine -Tipiracil  (LONSURF )   Patient requested [name] at [facility] at [place] date: 09/24/23   Medication will be filled on 05.27.25.

## 2023-09-25 ENCOUNTER — Other Ambulatory Visit: Payer: Self-pay

## 2023-10-08 ENCOUNTER — Other Ambulatory Visit: Payer: Self-pay | Admitting: Hematology & Oncology

## 2023-10-14 ENCOUNTER — Encounter (HOSPITAL_COMMUNITY)
Admission: RE | Admit: 2023-10-14 | Discharge: 2023-10-14 | Disposition: A | Discharge status: Home / Self Care | Source: Ambulatory Visit | Attending: Hematology & Oncology | Admitting: Hematology & Oncology

## 2023-10-14 DIAGNOSIS — C20 Malignant neoplasm of rectum: Secondary | ICD-10-CM | POA: Insufficient documentation

## 2023-10-14 LAB — GLUCOSE, CAPILLARY: Glucose-Capillary: 164 mg/dL — ABNORMAL HIGH (ref 70–99)

## 2023-10-14 MED ORDER — HEPARIN SOD (PORK) LOCK FLUSH 100 UNIT/ML IV SOLN
500.0000 [IU] | Freq: Once | INTRAVENOUS | Status: AC
  Administered 2023-10-14: 500 [IU] via INTRAVENOUS
  Filled 2023-10-14: qty 5

## 2023-10-14 MED ORDER — FLUDEOXYGLUCOSE F - 18 (FDG) INJECTION
10.7500 | Freq: Once | INTRAVENOUS | Status: AC
  Administered 2023-10-14: 10.71 via INTRAVENOUS

## 2023-10-15 ENCOUNTER — Encounter (INDEPENDENT_AMBULATORY_CARE_PROVIDER_SITE_OTHER): Payer: Self-pay

## 2023-10-15 ENCOUNTER — Other Ambulatory Visit (HOSPITAL_COMMUNITY): Payer: Self-pay

## 2023-10-15 ENCOUNTER — Other Ambulatory Visit: Payer: Self-pay

## 2023-10-15 ENCOUNTER — Encounter: Payer: Self-pay | Admitting: Hematology & Oncology

## 2023-10-15 NOTE — Progress Notes (Signed)
 Specialty Pharmacy Refill Coordination Note  MyChart Questionnaire Submission  John Douglas is a 68 y.o. male contacted today regarding refills of specialty medication(s) Lonsurf .  Next cycle approx: 10/22/23.   Patient requested: (Patient-Rptd) Pickup at [facility] at [facility] date: (Patient-Rptd) 10/17/23  Medication will be filled on 10/16/23.

## 2023-10-16 ENCOUNTER — Other Ambulatory Visit: Payer: Self-pay

## 2023-10-22 ENCOUNTER — Ambulatory Visit: Payer: Self-pay | Admitting: Hematology & Oncology

## 2023-10-24 ENCOUNTER — Encounter: Payer: Self-pay | Admitting: Hematology & Oncology

## 2023-10-24 ENCOUNTER — Inpatient Hospital Stay: Admitting: Hematology & Oncology

## 2023-10-24 ENCOUNTER — Inpatient Hospital Stay

## 2023-10-24 ENCOUNTER — Inpatient Hospital Stay: Attending: Hematology & Oncology

## 2023-10-24 ENCOUNTER — Other Ambulatory Visit: Payer: Self-pay

## 2023-10-24 VITALS — BP 153/80 | HR 80 | Temp 98.0°F | Resp 18 | Ht 70.0 in | Wt 215.0 lb

## 2023-10-24 DIAGNOSIS — Z7901 Long term (current) use of anticoagulants: Secondary | ICD-10-CM | POA: Diagnosis not present

## 2023-10-24 DIAGNOSIS — C20 Malignant neoplasm of rectum: Secondary | ICD-10-CM

## 2023-10-24 DIAGNOSIS — I4891 Unspecified atrial fibrillation: Secondary | ICD-10-CM | POA: Insufficient documentation

## 2023-10-24 DIAGNOSIS — Z452 Encounter for adjustment and management of vascular access device: Secondary | ICD-10-CM | POA: Diagnosis not present

## 2023-10-24 DIAGNOSIS — Z8673 Personal history of transient ischemic attack (TIA), and cerebral infarction without residual deficits: Secondary | ICD-10-CM | POA: Insufficient documentation

## 2023-10-24 DIAGNOSIS — D500 Iron deficiency anemia secondary to blood loss (chronic): Secondary | ICD-10-CM | POA: Diagnosis not present

## 2023-10-24 LAB — CBC WITH DIFFERENTIAL (CANCER CENTER ONLY)
Abs Immature Granulocytes: 0.13 10*3/uL — ABNORMAL HIGH (ref 0.00–0.07)
Basophils Absolute: 0 10*3/uL (ref 0.0–0.1)
Basophils Relative: 1 %
Eosinophils Absolute: 0.1 10*3/uL (ref 0.0–0.5)
Eosinophils Relative: 1 %
HCT: 29.4 % — ABNORMAL LOW (ref 39.0–52.0)
Hemoglobin: 10 g/dL — ABNORMAL LOW (ref 13.0–17.0)
Immature Granulocytes: 2 %
Lymphocytes Relative: 9 %
Lymphs Abs: 0.5 10*3/uL — ABNORMAL LOW (ref 0.7–4.0)
MCH: 33 pg (ref 26.0–34.0)
MCHC: 34 g/dL (ref 30.0–36.0)
MCV: 97 fL (ref 80.0–100.0)
Monocytes Absolute: 0.5 10*3/uL (ref 0.1–1.0)
Monocytes Relative: 8 %
Neutro Abs: 4.4 10*3/uL (ref 1.7–7.7)
Neutrophils Relative %: 79 %
Platelet Count: 195 10*3/uL (ref 150–400)
RBC: 3.03 MIL/uL — ABNORMAL LOW (ref 4.22–5.81)
RDW: 18.8 % — ABNORMAL HIGH (ref 11.5–15.5)
WBC Count: 5.6 10*3/uL (ref 4.0–10.5)
nRBC: 0 % (ref 0.0–0.2)

## 2023-10-24 LAB — RETICULOCYTES
Immature Retic Fract: 11.8 % (ref 2.3–15.9)
RBC.: 3.07 MIL/uL — ABNORMAL LOW (ref 4.22–5.81)
Retic Count, Absolute: 97 10*3/uL (ref 19.0–186.0)
Retic Ct Pct: 3.2 % — ABNORMAL HIGH (ref 0.4–3.1)

## 2023-10-24 LAB — CMP (CANCER CENTER ONLY)
ALT: 9 U/L (ref 0–44)
AST: 8 U/L — ABNORMAL LOW (ref 15–41)
Albumin: 4.2 g/dL (ref 3.5–5.0)
Alkaline Phosphatase: 44 U/L (ref 38–126)
Anion gap: 7 (ref 5–15)
BUN: 18 mg/dL (ref 8–23)
CO2: 28 mmol/L (ref 22–32)
Calcium: 9.1 mg/dL (ref 8.9–10.3)
Chloride: 104 mmol/L (ref 98–111)
Creatinine: 1.01 mg/dL (ref 0.61–1.24)
GFR, Estimated: >60 mL/min (ref >60)
Glucose, Bld: 139 mg/dL — ABNORMAL HIGH (ref 70–99)
Potassium: 3.3 mmol/L — ABNORMAL LOW (ref 3.5–5.1)
Sodium: 139 mmol/L (ref 135–145)
Total Bilirubin: 1.7 mg/dL — ABNORMAL HIGH (ref 0.0–1.2)
Total Protein: 6.5 g/dL (ref 6.5–8.1)

## 2023-10-24 LAB — IRON AND IRON BINDING CAPACITY (CC-WL,HP ONLY)
Iron: 96 ug/dL (ref 45–182)
Saturation Ratios: 37 % (ref 17.9–39.5)
TIBC: 258 ug/dL (ref 250–450)
UIBC: 162 ug/dL (ref 117–376)

## 2023-10-24 LAB — CEA (ACCESS): CEA (CHCC): 1.32 ng/mL (ref 0.00–5.00)

## 2023-10-24 LAB — FERRITIN: Ferritin: 717 ng/mL — ABNORMAL HIGH (ref 24–336)

## 2023-10-24 MED ORDER — SODIUM CHLORIDE 0.9% FLUSH
10.0000 mL | INTRAVENOUS | Status: DC | PRN
  Administered 2023-10-24: 10 mL via INTRAVENOUS

## 2023-10-24 MED ORDER — HEPARIN SOD (PORK) LOCK FLUSH 100 UNIT/ML IV SOLN
500.0000 [IU] | Freq: Once | INTRAVENOUS | Status: AC
  Administered 2023-10-24: 500 [IU] via INTRAVENOUS

## 2023-10-24 NOTE — Progress Notes (Signed)
 Hematology and Oncology Follow Up Visit  John Douglas [id number] [birth year] 68 y.o. 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 twice daily Aspirin  81 mg p.o. daily FOLFOX/Vectibix  --start on 03/07/2023; s/p cycle #8 - d/c on 07/30/2023 Xgeva  120 mg subcu every 3 months-start 03/07/2023 - patient declined any further Xgeva . Lonsurf  60 mg po BID - start on 08/05/2023 -- changed on 09/20/2023                           Interim History:  John Douglas is here today for follow-up.  Overall, I think is doing pretty well.  John Douglas did have a PET scan that was done.  This was done on 10/14/2023.  The PET scan essentially showed everything was relatively stable.  There is no new areas of disease.  There is no evidence of progressive disease.  I am very happy about this.  His last CEA level was down to 2.11.  John Douglas has had no problems with the atrial fibrillation.  John Douglas continues on the anticoagulation.  John Douglas has had no bleeding.  John Douglas has had no headache.  John Douglas has had no leg swelling.  John Douglas has had no rashes.  We have his Lonsurf  at 4 mg twice a day.  This seems to be working well for him.  John Douglas has had no fever.  John Douglas has had no cough.  Overall, I would say that his performance status is ECOG 1.     Medications:  Allergies as of 10/24/2023       Reactions   Celexa  [citalopram ] Other (See Comments)   Contraindicated with A-Fib   Ibuprofen Other (See Comments)   Was told to not take this    Metformin And Related Other  (See Comments)   Bloody stools    Naproxen Other (See Comments)   Was told to not take this    Yellow Jacket Venom [bee Venom] Swelling, Other (See Comments)   Severe swelling where stung   Penicillins Hives   Did it involve swelling of the face/tongue/throat, SOB, or low BP? Unk Did it involve sudden or severe rash/hives, skin peeling, or any reaction on the inside of your mouth or nose? Yes Did you need to seek medical attention at a hospital or doctor's office? Unk When did it last happen? Childhood- 55 years ago If all above answers are NO, may proceed with cephalosporin use.   Xgeva  [denosumab ] Other (See Comments)   Runny and bloody nose, lower back pain, watery discharge from eyes, sinuses were raw feeling        Medication List        Accurate as of October 24, 2023  1:51 PM. If you have any questions, ask your nurse or doctor.          diltiazem  240 MG 24 hr capsule Commonly known as: CARDIZEM  CD TAKE 1 CAPSULE BY MOUTH EVERY DAY   Eliquis  5 MG Tabs tablet Generic drug: apixaban  TAKE 1 TABLET BY MOUTH TWICE A DAY   ezetimibe  10 MG tablet Commonly  known as: ZETIA  Take 1 tablet by mouth once daily   fluticasone  50 MCG/ACT nasal spray Commonly known as: Flonase  Place 1 spray into both nostrils 2 (two) times daily as needed (sinus congestion).   furosemide  40 MG tablet Commonly known as: LASIX  TAKE 1 TABLET BY MOUTH EVERY DAY   glimepiride  1 MG tablet Commonly known as: AMARYL  Take 1 mg by mouth daily.   HYDROcodone -acetaminophen  5-325 MG tablet Commonly known as: Norco Take 1 tablet by mouth every 6 (six) hours as needed for moderate pain (pain score 4-6).   Lonsurf  15-6.14 MG tablet Generic drug: trifluridine -tipiracil  Take 4 tablets (60 mg of trifluridine  total) by mouth 2 (two) times daily after a meal. Take within 1 hr after AM & PM meals on days 1-5, 8-12. Repeat every 28 days.   metoprolol  succinate 50 MG 24 hr tablet Commonly known as:  TOPROL -XL TAKE 1 TABLET BY MOUTH EVERY DAY   mupirocin  ointment 2 % Commonly known as: BACTROBAN  Apply 1 Application topically 3 (three) times daily. Apply to pinky that has the infection.   nitroGLYCERIN  0.4 MG SL tablet Commonly known as: NITROSTAT  Place 1 tablet (0.4 mg total) under the tongue every 5 (five) minutes as needed for chest pain.   potassium chloride  SA 20 MEQ tablet Commonly known as: Klor-Con  M20 Take 2 tablets (40 mEq total) by mouth daily.   pramoxine-hydrocortisone  1-1 % rectal cream Commonly known as: PROCTOCREAM-HC Place 1 application  rectally 2 (two) times daily as needed for hemorrhoids or anal itching.   sulfacetamide  10 % ophthalmic solution Commonly known as: Bleph -10 Place 1 drop into both eyes every 4 (four) hours.        Allergies:  Allergies  Allergen Reactions   Celexa  [Citalopram ] Other (See Comments)    Contraindicated with A-Fib   Ibuprofen Other (See Comments)    Was told to not take this    Metformin And Related Other (See Comments)    Bloody stools    Naproxen Other (See Comments)    Was told to not take this    Yellow Jacket Venom [Bee Venom] Swelling and Other (See Comments)    Severe swelling where stung   Penicillins Hives    Did it involve swelling of the face/tongue/throat, SOB, or low BP? Unk Did it involve sudden or severe rash/hives, skin peeling, or any reaction on the inside of your mouth or nose? Yes Did you need to seek medical attention at a hospital or doctor's office? Unk When did it last happen? Childhood- 55 years ago If all above answers are NO, may proceed with cephalosporin use.    Xgeva  [Denosumab ] Other (See Comments)    Runny and bloody nose, lower back pain, watery discharge from eyes, sinuses were raw feeling    Past Medical History, Surgical history, Social history, and Family History were reviewed and updated.  Review of Systems: Review of Systems  Constitutional: Negative.   HENT: Negative.     Eyes: Negative.   Respiratory: Negative.    Cardiovascular: Negative.   Gastrointestinal: Negative.   Genitourinary: Negative.   Musculoskeletal: Negative.   Skin: Negative.   Neurological: Negative.   Endo/Heme/Allergies: Negative.   Psychiatric/Behavioral: Negative.       Physical Exam:  height is 5' 10 (1.778 m) and weight is 215 lb (97.5 kg). His oral temperature is 98 F (36.7 C). His blood pressure is 153/80 (abnormal) and his pulse is 80. His respiration is 18 and oxygen saturation is 99%.  Wt Readings from Last 3 Encounters:  10/24/23 215 lb (97.5 kg)  09/20/23 215 lb (97.5 kg)  08/28/23 212 lb (96.2 kg)    Physical Exam Vitals reviewed.  HENT:     Head: Normocephalic and atraumatic.   Eyes:     Pupils: Pupils are equal, round, and reactive to light.    Cardiovascular:     Rate and Rhythm: Normal rate and regular rhythm.     Heart sounds: Normal heart sounds.  Pulmonary:     Effort: Pulmonary effort is normal.     Breath sounds: Normal breath sounds.  Abdominal:     General: Bowel sounds are normal.     Palpations: Abdomen is soft.   Musculoskeletal:        General: No tenderness or deformity. Normal range of motion.     Cervical back: Normal range of motion.  Lymphadenopathy:     Cervical: No cervical adenopathy.   Skin:    General: Skin is warm and dry.     Findings: No erythema or rash.   Neurological:     Mental Status: John Douglas is alert and oriented to person, place, and time.   Psychiatric:        Behavior: Behavior normal.        Thought Content: Thought content normal.        Judgment: Judgment normal.    Lab Results  Component Value Date   WBC 5.6 10/24/2023   HGB 10.0 (L) 10/24/2023   HCT 29.4 (L) 10/24/2023   MCV 97.0 10/24/2023   PLT 195 10/24/2023   Lab Results  Component Value Date   FERRITIN 652 (H) 09/20/2023   IRON 77 09/20/2023   TIBC 315 09/20/2023   UIBC 238 09/20/2023   IRONPCTSAT 24 09/20/2023   Lab Results   Component Value Date   RETICCTPCT 3.2 (H) 10/24/2023   RBC 3.03 (L) 10/24/2023   RBC 3.07 (L) 10/24/2023   No results found for: KPAFRELGTCHN, LAMBDASER, KAPLAMBRATIO No results found for: IGGSERUM, IGA, IGMSERUM No results found for: STEPHANY CARLOTA BENSON MARKEL EARLA JOANNIE DOC VICK, SPEI   Chemistry      Component Value Date/Time   NA 139 09/20/2023 1503   NA 140 08/28/2019 0940   K 3.5 09/20/2023 1503   CL 102 09/20/2023 1503   CO2 29 09/20/2023 1503   BUN 26 (H) 09/20/2023 1503   BUN 21 08/28/2019 0940   CREATININE 1.04 09/20/2023 1503      Component Value Date/Time   CALCIUM  9.0 09/20/2023 1503   ALKPHOS 61 09/20/2023 1503   AST 9 (L) 09/20/2023 1503   ALT 12 09/20/2023 1503   BILITOT 0.9 09/20/2023 1503       Impression and Plan: John Douglas is a pleasant 68 yo caucasian gentleman with history of CVA secondary to atrial fib and rectal bleeding. John Douglas was diagnosed with metastatic adenocarcinoma of the rectum -K-ras wild type/BRAF wild-type/HER-2 negative/MMR proficient. John Douglas had adenopathy distant to the rectum and now has disease recurrence.    I am happy that his PET scan looks stable.  Overall, John Douglas looks fantastic.  I am very happy about that.  We will plan to get him back to see us  in about 6 weeks.  Again, I think the loss of is doing okay for him.  Usually, the CEA will tell us  how everything is going.  I will plan to get him back to see us .  I do not see that I have to do any  scans for another  3 months.     Maude JONELLE Crease, MD 6/26/20251:51 PM

## 2023-10-24 NOTE — Patient Instructions (Signed)

## 2023-10-25 ENCOUNTER — Other Ambulatory Visit: Payer: Self-pay

## 2023-10-26 ENCOUNTER — Other Ambulatory Visit: Payer: Self-pay

## 2023-11-11 ENCOUNTER — Other Ambulatory Visit: Payer: Self-pay

## 2023-11-11 NOTE — Progress Notes (Signed)
 Specialty Pharmacy Ongoing Clinical Assessment Note  John Douglas is a 68 y.o. male who is being followed by the specialty pharmacy service for RxSp Oncology   Patient's specialty medication(s) reviewed today: Trifluridine -Tipiracil  (LONSURF )   Missed doses in the last 4 weeks: 0   Patient/Caregiver did not have any additional questions or concerns.   Therapeutic benefit summary: Patient is achieving benefit   Adverse events/side effects summary: No adverse events/side effects   Patient's therapy is appropriate to: Continue    Goals Addressed             This Visit's Progress    Slow Disease Progression   On track    Patient is on track. Patient will maintain adherence.  Patient's CEA has decreased down to 1.32 ng/mL on 10/24/23. PET scan on 6/16 showed that everything was relatively stable and no evidence of progressive disease.         Follow up: 3 months  [facility]

## 2023-11-11 NOTE — Progress Notes (Signed)
 Specialty Pharmacy Refill Coordination Note  John Douglas is a 68 y.o. male contacted today regarding refills of specialty medication(s) Trifluridine -Tipiracil  (LONSURF )   Patient requested Pickup at [facility] at [place] date: 11/13/23   Medication will be filled on 11/12/23.

## 2023-11-12 ENCOUNTER — Other Ambulatory Visit: Payer: Self-pay

## 2023-12-03 ENCOUNTER — Encounter: Payer: Self-pay | Admitting: Hematology & Oncology

## 2023-12-03 ENCOUNTER — Encounter (HOSPITAL_COMMUNITY): Payer: Self-pay

## 2023-12-03 ENCOUNTER — Emergency Department (HOSPITAL_COMMUNITY)

## 2023-12-03 ENCOUNTER — Inpatient Hospital Stay (HOSPITAL_COMMUNITY)

## 2023-12-03 ENCOUNTER — Encounter: Payer: Self-pay | Admitting: Internal Medicine

## 2023-12-03 ENCOUNTER — Inpatient Hospital Stay (HOSPITAL_COMMUNITY)
Admission: EM | Admit: 2023-12-03 | Discharge: 2023-12-07 | DRG: 812 | Disposition: A | Discharge status: Home / Self Care | Attending: Family Medicine | Admitting: Family Medicine

## 2023-12-03 ENCOUNTER — Other Ambulatory Visit: Payer: Self-pay

## 2023-12-03 DIAGNOSIS — I2489 Other forms of acute ischemic heart disease: Secondary | ICD-10-CM | POA: Diagnosis present

## 2023-12-03 DIAGNOSIS — E782 Mixed hyperlipidemia: Secondary | ICD-10-CM | POA: Diagnosis present

## 2023-12-03 DIAGNOSIS — I5A Non-ischemic myocardial injury (non-traumatic): Secondary | ICD-10-CM | POA: Diagnosis present

## 2023-12-03 DIAGNOSIS — D500 Iron deficiency anemia secondary to blood loss (chronic): Secondary | ICD-10-CM | POA: Diagnosis present

## 2023-12-03 DIAGNOSIS — E1140 Type 2 diabetes mellitus with diabetic neuropathy, unspecified: Secondary | ICD-10-CM | POA: Diagnosis present

## 2023-12-03 DIAGNOSIS — Z8673 Personal history of transient ischemic attack (TIA), and cerebral infarction without residual deficits: Secondary | ICD-10-CM | POA: Diagnosis not present

## 2023-12-03 DIAGNOSIS — K648 Other hemorrhoids: Secondary | ICD-10-CM | POA: Diagnosis not present

## 2023-12-03 DIAGNOSIS — K635 Polyp of colon: Secondary | ICD-10-CM | POA: Diagnosis not present

## 2023-12-03 DIAGNOSIS — K760 Fatty (change of) liver, not elsewhere classified: Secondary | ICD-10-CM | POA: Diagnosis present

## 2023-12-03 DIAGNOSIS — I10 Essential (primary) hypertension: Secondary | ICD-10-CM

## 2023-12-03 DIAGNOSIS — D84821 Immunodeficiency due to drugs: Secondary | ICD-10-CM | POA: Diagnosis present

## 2023-12-03 DIAGNOSIS — I4819 Other persistent atrial fibrillation: Secondary | ICD-10-CM | POA: Diagnosis present

## 2023-12-03 DIAGNOSIS — Z789 Other specified health status: Secondary | ICD-10-CM

## 2023-12-03 DIAGNOSIS — I2510 Atherosclerotic heart disease of native coronary artery without angina pectoris: Secondary | ICD-10-CM | POA: Diagnosis present

## 2023-12-03 DIAGNOSIS — Z7984 Long term (current) use of oral hypoglycemic drugs: Secondary | ICD-10-CM

## 2023-12-03 DIAGNOSIS — Z880 Allergy status to penicillin: Secondary | ICD-10-CM

## 2023-12-03 DIAGNOSIS — K3189 Other diseases of stomach and duodenum: Secondary | ICD-10-CM | POA: Diagnosis not present

## 2023-12-03 DIAGNOSIS — G9389 Other specified disorders of brain: Secondary | ICD-10-CM

## 2023-12-03 DIAGNOSIS — R195 Other fecal abnormalities: Secondary | ICD-10-CM | POA: Diagnosis not present

## 2023-12-03 DIAGNOSIS — D649 Anemia, unspecified: Principal | ICD-10-CM | POA: Diagnosis present

## 2023-12-03 DIAGNOSIS — K269 Duodenal ulcer, unspecified as acute or chronic, without hemorrhage or perforation: Secondary | ICD-10-CM | POA: Diagnosis present

## 2023-12-03 DIAGNOSIS — T451X5A Adverse effect of antineoplastic and immunosuppressive drugs, initial encounter: Secondary | ICD-10-CM | POA: Diagnosis present

## 2023-12-03 DIAGNOSIS — D6481 Anemia due to antineoplastic chemotherapy: Principal | ICD-10-CM | POA: Diagnosis present

## 2023-12-03 DIAGNOSIS — Z85048 Personal history of other malignant neoplasm of rectum, rectosigmoid junction, and anus: Secondary | ICD-10-CM

## 2023-12-03 DIAGNOSIS — R7401 Elevation of levels of liver transaminase levels: Secondary | ICD-10-CM | POA: Diagnosis not present

## 2023-12-03 DIAGNOSIS — Z7901 Long term (current) use of anticoagulants: Secondary | ICD-10-CM | POA: Diagnosis not present

## 2023-12-03 DIAGNOSIS — N179 Acute kidney failure, unspecified: Secondary | ICD-10-CM | POA: Diagnosis present

## 2023-12-03 DIAGNOSIS — Z87891 Personal history of nicotine dependence: Secondary | ICD-10-CM | POA: Diagnosis not present

## 2023-12-03 DIAGNOSIS — I4891 Unspecified atrial fibrillation: Secondary | ICD-10-CM

## 2023-12-03 DIAGNOSIS — C20 Malignant neoplasm of rectum: Secondary | ICD-10-CM | POA: Diagnosis present

## 2023-12-03 DIAGNOSIS — I252 Old myocardial infarction: Secondary | ICD-10-CM

## 2023-12-03 DIAGNOSIS — D123 Benign neoplasm of transverse colon: Secondary | ICD-10-CM | POA: Diagnosis present

## 2023-12-03 DIAGNOSIS — K56699 Other intestinal obstruction unspecified as to partial versus complete obstruction: Secondary | ICD-10-CM

## 2023-12-03 DIAGNOSIS — Z888 Allergy status to other drugs, medicaments and biological substances status: Secondary | ICD-10-CM

## 2023-12-03 DIAGNOSIS — Z91030 Bee allergy status: Secondary | ICD-10-CM

## 2023-12-03 DIAGNOSIS — J189 Pneumonia, unspecified organism: Secondary | ICD-10-CM | POA: Insufficient documentation

## 2023-12-03 DIAGNOSIS — Z79899 Other long term (current) drug therapy: Secondary | ICD-10-CM

## 2023-12-03 DIAGNOSIS — R918 Other nonspecific abnormal finding of lung field: Secondary | ICD-10-CM | POA: Insufficient documentation

## 2023-12-03 DIAGNOSIS — K5730 Diverticulosis of large intestine without perforation or abscess without bleeding: Secondary | ICD-10-CM | POA: Diagnosis present

## 2023-12-03 DIAGNOSIS — E872 Acidosis, unspecified: Secondary | ICD-10-CM | POA: Diagnosis present

## 2023-12-03 DIAGNOSIS — Z8249 Family history of ischemic heart disease and other diseases of the circulatory system: Secondary | ICD-10-CM | POA: Diagnosis not present

## 2023-12-03 DIAGNOSIS — K259 Gastric ulcer, unspecified as acute or chronic, without hemorrhage or perforation: Secondary | ICD-10-CM | POA: Diagnosis present

## 2023-12-03 DIAGNOSIS — K624 Stenosis of anus and rectum: Secondary | ICD-10-CM | POA: Diagnosis not present

## 2023-12-03 DIAGNOSIS — K644 Residual hemorrhoidal skin tags: Secondary | ICD-10-CM | POA: Diagnosis present

## 2023-12-03 DIAGNOSIS — R7989 Other specified abnormal findings of blood chemistry: Secondary | ICD-10-CM | POA: Diagnosis not present

## 2023-12-03 DIAGNOSIS — C775 Secondary and unspecified malignant neoplasm of intrapelvic lymph nodes: Secondary | ICD-10-CM | POA: Diagnosis present

## 2023-12-03 DIAGNOSIS — D638 Anemia in other chronic diseases classified elsewhere: Secondary | ICD-10-CM | POA: Diagnosis present

## 2023-12-03 DIAGNOSIS — I110 Hypertensive heart disease with heart failure: Secondary | ICD-10-CM | POA: Diagnosis present

## 2023-12-03 DIAGNOSIS — Z955 Presence of coronary angioplasty implant and graft: Secondary | ICD-10-CM

## 2023-12-03 DIAGNOSIS — Z8042 Family history of malignant neoplasm of prostate: Secondary | ICD-10-CM

## 2023-12-03 DIAGNOSIS — I255 Ischemic cardiomyopathy: Secondary | ICD-10-CM | POA: Diagnosis present

## 2023-12-03 DIAGNOSIS — K5792 Diverticulitis of intestine, part unspecified, without perforation or abscess without bleeding: Secondary | ICD-10-CM

## 2023-12-03 DIAGNOSIS — D509 Iron deficiency anemia, unspecified: Secondary | ICD-10-CM | POA: Diagnosis not present

## 2023-12-03 DIAGNOSIS — Z923 Personal history of irradiation: Secondary | ICD-10-CM

## 2023-12-03 DIAGNOSIS — K6389 Other specified diseases of intestine: Secondary | ICD-10-CM | POA: Diagnosis not present

## 2023-12-03 DIAGNOSIS — R5381 Other malaise: Secondary | ICD-10-CM | POA: Diagnosis present

## 2023-12-03 DIAGNOSIS — Z881 Allergy status to other antibiotic agents status: Secondary | ICD-10-CM

## 2023-12-03 DIAGNOSIS — I5032 Chronic diastolic (congestive) heart failure: Secondary | ICD-10-CM | POA: Diagnosis present

## 2023-12-03 DIAGNOSIS — Z886 Allergy status to analgesic agent status: Secondary | ICD-10-CM

## 2023-12-03 LAB — HEPATITIS PANEL, ACUTE
HCV Ab: NONREACTIVE
Hep A IgM: NONREACTIVE
Hep B C IgM: NONREACTIVE
Hepatitis B Surface Ag: NONREACTIVE

## 2023-12-03 LAB — URINALYSIS, W/ REFLEX TO CULTURE (INFECTION SUSPECTED)
Bilirubin Urine: NEGATIVE
Glucose, UA: NEGATIVE mg/dL
Hgb urine dipstick: NEGATIVE
Ketones, ur: NEGATIVE mg/dL
Leukocytes,Ua: NEGATIVE
Nitrite: NEGATIVE
Protein, ur: NEGATIVE mg/dL
Specific Gravity, Urine: 1.015 (ref 1.005–1.030)
pH: 5 (ref 5.0–8.0)

## 2023-12-03 LAB — PROTIME-INR
INR: 2.6 — ABNORMAL HIGH (ref 0.8–1.2)
Prothrombin Time: 29 s — ABNORMAL HIGH (ref 11.4–15.2)

## 2023-12-03 LAB — CBC WITH DIFFERENTIAL/PLATELET
Abs Immature Granulocytes: 0.06 K/uL (ref 0.00–0.07)
Basophils Absolute: 0 K/uL (ref 0.0–0.1)
Basophils Relative: 0 %
Eosinophils Absolute: 0 K/uL (ref 0.0–0.5)
Eosinophils Relative: 0 %
HCT: 10.5 % — ABNORMAL LOW (ref 39.0–52.0)
Hemoglobin: 3.6 g/dL — CL (ref 13.0–17.0)
Immature Granulocytes: 1 %
Lymphocytes Relative: 6 %
Lymphs Abs: 0.5 K/uL — ABNORMAL LOW (ref 0.7–4.0)
MCH: 33.6 pg (ref 26.0–34.0)
MCHC: 34.3 g/dL (ref 30.0–36.0)
MCV: 98.1 fL (ref 80.0–100.0)
Monocytes Absolute: 0.3 K/uL (ref 0.1–1.0)
Monocytes Relative: 4 %
Neutro Abs: 7.2 K/uL (ref 1.7–7.7)
Neutrophils Relative %: 89 %
Platelets: 253 K/uL (ref 150–400)
RBC: 1.07 MIL/uL — ABNORMAL LOW (ref 4.22–5.81)
RDW: 20.1 % — ABNORMAL HIGH (ref 11.5–15.5)
WBC: 8.1 K/uL (ref 4.0–10.5)
nRBC: 0 % (ref 0.0–0.2)

## 2023-12-03 LAB — COMPREHENSIVE METABOLIC PANEL WITH GFR
ALT: 217 U/L — ABNORMAL HIGH (ref 0–44)
AST: 164 U/L — ABNORMAL HIGH (ref 15–41)
Albumin: 3.3 g/dL — ABNORMAL LOW (ref 3.5–5.0)
Alkaline Phosphatase: 47 U/L (ref 38–126)
Anion gap: 16 — ABNORMAL HIGH (ref 5–15)
BUN: 55 mg/dL — ABNORMAL HIGH (ref 8–23)
CO2: 19 mmol/L — ABNORMAL LOW (ref 22–32)
Calcium: 9 mg/dL (ref 8.9–10.3)
Chloride: 101 mmol/L (ref 98–111)
Creatinine, Ser: 2.08 mg/dL — ABNORMAL HIGH (ref 0.61–1.24)
GFR, Estimated: 34 mL/min — ABNORMAL LOW (ref >60)
Glucose, Bld: 240 mg/dL — ABNORMAL HIGH (ref 70–99)
Potassium: 3.6 mmol/L (ref 3.5–5.1)
Sodium: 136 mmol/L (ref 135–145)
Total Bilirubin: 1.5 mg/dL — ABNORMAL HIGH (ref 0.0–1.2)
Total Protein: 6 g/dL — ABNORMAL LOW (ref 6.5–8.1)

## 2023-12-03 LAB — MRSA NEXT GEN BY PCR, NASAL: MRSA by PCR Next Gen: NOT DETECTED

## 2023-12-03 LAB — LACTIC ACID, PLASMA
Lactic Acid, Venous: 6 mmol/L (ref 0.5–1.9)
Lactic Acid, Venous: 5 mmol/L (ref 0.5–1.9)
Lactic Acid, Venous: 2.3 mmol/L (ref 0.5–1.9)

## 2023-12-03 LAB — CBG MONITORING, ED
Glucose-Capillary: 234 mg/dL — ABNORMAL HIGH (ref 70–99)
Glucose-Capillary: 264 mg/dL — ABNORMAL HIGH (ref 70–99)

## 2023-12-03 LAB — IRON AND TIBC
Iron: 121 ug/dL (ref 45–182)
Saturation Ratios: 48 % — ABNORMAL HIGH (ref 17.9–39.5)
TIBC: 255 ug/dL (ref 250–450)
UIBC: 134 ug/dL

## 2023-12-03 LAB — PREPARE RBC (CROSSMATCH)

## 2023-12-03 LAB — HIV ANTIBODY (ROUTINE TESTING W REFLEX): HIV Screen 4th Generation wRfx: NONREACTIVE

## 2023-12-03 LAB — VITAMIN B12: Vitamin B-12: 661 pg/mL (ref 180–914)

## 2023-12-03 LAB — TROPONIN I (HIGH SENSITIVITY)
Troponin I (High Sensitivity): 520 ng/L (ref <18)
Troponin I (High Sensitivity): 498 ng/L (ref <18)

## 2023-12-03 LAB — HEMOGLOBIN AND HEMATOCRIT, BLOOD
HCT: 18.9 % — ABNORMAL LOW (ref 39.0–52.0)
Hemoglobin: 6.6 g/dL — CL (ref 13.0–17.0)

## 2023-12-03 LAB — FERRITIN: Ferritin: 2157 ng/mL — ABNORMAL HIGH (ref 24–336)

## 2023-12-03 LAB — TRANSFERRIN: Transferrin: 175 mg/dL — ABNORMAL LOW (ref 180–329)

## 2023-12-03 LAB — GLUCOSE, CAPILLARY: Glucose-Capillary: 248 mg/dL — ABNORMAL HIGH (ref 70–99)

## 2023-12-03 LAB — APTT: aPTT: 43 s — ABNORMAL HIGH (ref 24–36)

## 2023-12-03 MED ORDER — INSULIN ASPART 100 UNIT/ML IJ SOLN
0.0000 [IU] | Freq: Three times a day (TID) | INTRAMUSCULAR | Status: DC
  Administered 2023-12-03 – 2023-12-04 (×2): 5 [IU] via SUBCUTANEOUS
  Administered 2023-12-04: 7 [IU] via SUBCUTANEOUS
  Administered 2023-12-04: 3 [IU] via SUBCUTANEOUS
  Administered 2023-12-05 (×2): 2 [IU] via SUBCUTANEOUS
  Administered 2023-12-05: 3 [IU] via SUBCUTANEOUS
  Administered 2023-12-06 – 2023-12-07 (×4): 2 [IU] via SUBCUTANEOUS

## 2023-12-03 MED ORDER — SODIUM CHLORIDE 0.9% FLUSH
10.0000 mL | Freq: Two times a day (BID) | INTRAVENOUS | Status: DC
  Administered 2023-12-04 – 2023-12-06 (×5): 10 mL

## 2023-12-03 MED ORDER — PANTOPRAZOLE SODIUM 40 MG IV SOLR
40.0000 mg | Freq: Two times a day (BID) | INTRAVENOUS | Status: DC
  Administered 2023-12-03 – 2023-12-05 (×6): 40 mg via INTRAVENOUS
  Filled 2023-12-03 (×6): qty 10

## 2023-12-03 MED ORDER — CHLORHEXIDINE GLUCONATE CLOTH 2 % EX PADS
6.0000 | MEDICATED_PAD | Freq: Every day | CUTANEOUS | Status: DC
  Administered 2023-12-04 – 2023-12-07 (×4): 6 via TOPICAL

## 2023-12-03 MED ORDER — PEG 3350-KCL-NA BICARB-NACL 420 G PO SOLR
4000.0000 mL | Freq: Once | ORAL | Status: AC
  Administered 2023-12-04: 4000 mL via ORAL
  Filled 2023-12-03: qty 4000

## 2023-12-03 MED ORDER — SODIUM CHLORIDE 0.9 % IV SOLN
1.0000 g | Freq: Once | INTRAVENOUS | Status: AC
  Administered 2023-12-03: 1 g via INTRAVENOUS
  Filled 2023-12-03: qty 10

## 2023-12-03 MED ORDER — SODIUM CHLORIDE 0.9% IV SOLUTION: Freq: Once | INTRAVENOUS | Status: AC

## 2023-12-03 MED ORDER — SODIUM CHLORIDE 0.9% FLUSH
10.0000 mL | INTRAVENOUS | Status: DC | PRN
  Administered 2023-12-06: 20 mL
  Administered 2023-12-07: 10 mL

## 2023-12-03 MED ORDER — HEPARIN (PORCINE) 25000 UT/250ML-% IV SOLN
1200.0000 [IU]/h | INTRAVENOUS | Status: DC
  Administered 2023-12-03: 1000 [IU]/h via INTRAVENOUS
  Administered 2023-12-04: 1200 [IU]/h via INTRAVENOUS
  Filled 2023-12-03 (×2): qty 250

## 2023-12-03 MED ORDER — SODIUM CHLORIDE 0.9% IV SOLUTION: Freq: Once | INTRAVENOUS | Status: DC

## 2023-12-03 MED ORDER — PANTOPRAZOLE SODIUM 40 MG PO TBEC: 40.0000 mg | DELAYED_RELEASE_TABLET | Freq: Two times a day (BID) | ORAL | Status: DC

## 2023-12-03 MED ORDER — SODIUM CHLORIDE 0.9 % IV SOLN
500.0000 mg | Freq: Once | INTRAVENOUS | Status: AC
  Administered 2023-12-03: 500 mg via INTRAVENOUS
  Filled 2023-12-03: qty 5

## 2023-12-03 MED ORDER — LACTATED RINGERS IV BOLUS
500.0000 mL | Freq: Once | INTRAVENOUS | Status: AC
  Administered 2023-12-03: 500 mL via INTRAVENOUS

## 2023-12-03 NOTE — Assessment & Plan Note (Addendum)
 Atelectasis vs. CAP on chest x-ray. He is not complaining of SOB, cough, sick symptoms. On exam, he is well appearing on RA and lungs are CTAB; however, his BP is soft. He is afrebile and does not have a leukocytosis; however, he is immunosuppressed on chemotherapy. He has a lactic acidosis, but I suspect this is from his anemia. The ER covered him with Rocephin  and Azithromycin . I will reevaluate him tomorrow to determine further workup/treatment.

## 2023-12-03 NOTE — Consult Note (Signed)
 Consultation Note   Referring Provider:  Teaching Service PCP: Stephanie Charlene CROME, MD Primary Gastroenterologist: Sandor Flatter, MD     Reason for Consultation:  Anemia DOA: 12/03/2023         Hospital Day: 1   ASSESSMENT    68 year old male with history of metastatic rectal cancer treated with chemotherapy and radiation (patient opted against surgical resection).   Had good response to treatment. 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   Rectal biopsies at the area were negative for recurrent cancer.    Profound normocytic anemia.  Hemoglobin 3.6, down from around 10 in June .  No overt bleeding.  Unclear cause of the severe decline in hemoglobin.  Long Sarff associated with severe myelosuppression but his WBCs and platelets are normal so not sure it is the culprit.  He could be having all called GI bleeding from an upper source or possibly from the right colon as we have never been able to get a good look at colon due to poor prep on initial exam and then inability to traverse the rectal stricture on follow-up exam in 2023.   Mildly elevated LFTs AKI Elevated troponin Suspect these are all due to severe anemia.  Cardiology has evaluated and no ischemic workup needed at this time.  Patient has not had any chest pain.   Atrial fibrillation On Eliquis   History of CVA (while off of Eliquis )  Principal Problem:   Symptomatic anemia Active Problems:   Atrial fibrillation with RVR (HCC)   Chronic health problem   Elevated troponin   Community acquired pneumonia of left lower lobe of lung   AKI (acute kidney injury) (HCC)   Transaminitis   Infiltrate of lung present on chest x-ray      PLAN:   --Patient understandably nervous about holding Eliquis .  Spoke to primary team and they will initiate IV heparin  which we can hold when/if we perform any  endoscopic workup this admission.  -- Asked primary team to transfuse another unit of RBCs -- Patient seems agreeable to EGD and reattempt at colonoscopy (or at least flexible sigmoidoscopy) .  This would likely be done on Thursday. Will go ahead and start his bowel prep in the a.m. -- Clear liquid diet -- Iron studies are pending --Empiric PPI.   HPI   68 y.o. year old male with a medical history including but not limited to Afib, CVA, CAD,  ischemic cardiomyopathy, DM2 and rectal cancer.     In June 2021 patient underwent a colonoscopy with findings of a malignant, partially obstructing tumor in the proximal rectum.  Also, a couple of small polyps were removed from the descending colon and the rectum.  Preparation for the colonoscopy was poor limiting visualization.  The 2 polyps were hyperplastic  Rectal mass biopsies positive for adenocarcinoma.  Imaging showed adenopathy distant to the rectum.  Patient has been undergoing chemotherapy and had XRT as well. In March 2023 he came for colonoscopy but procedure unable to be completed due to severe nontraversable stenosis of the rectum.  Rectal biopsies were benign, nonspecific.  In April he was started on Lonsurf .  In May patient was  referred to our office for heme positive stool.  He declined the appointment.  He did not want to undergo another lower endoscopy as he had had a prior stroke while off Eliquis .   PET scan done in June 2025 and everything appeared relatively stable.   There was wall thickening with perirectal fat stranding identified.  He had a follow-up with Oncology on 6/26 and   Dr. Timmy was pleased with the PET scan results. . CEA level was down to 2.    In late June patient's hemoglobin was 10.  He presents to the ED today with a hemoglobin of 3.6.  He adamantly denies any overt GI blood loss in the form of blood in stool or black stools.  No hematemesis.  He has no abdominal pain or other localizing GI symptoms.  He has loose  stools from time to time after consumption of spicy foods .  He has not had blood loss in any other form such as epistaxis, hematuria, etc. He denies NSAID use.  He is on Eliquis .   In the ED his liver enzymes are elevated, high-sensitivity troponin is markedly elevated at 520 and he has AKI.  He has not been having any chest pain.  Cardiology has evaluated and feels this is demand ischemia.  Labs and Imaging:  Recent Labs    12/03/23 0957  PROT 6.0*  ALBUMIN 3.3*  AST 164*  ALT 217*  ALKPHOS 47  BILITOT 1.5*   Recent Labs    12/03/23 0957  WBC 8.1  HGB 3.6*  HCT 10.5*  MCV 98.1  PLT 253   Recent Labs    12/03/23 0957  NA 136  K 3.6  CL 101  CO2 19*  GLUCOSE 240*  BUN 55*  CREATININE 2.08*  CALCIUM  9.0     US  Abdomen Limited RUQ (LIVER/GB) CLINICAL DATA:  Elevated liver enzymes.  EXAM: ULTRASOUND ABDOMEN LIMITED RIGHT UPPER QUADRANT  COMPARISON:  Abdomen and pelvis CT dated 01/24/2023.  FINDINGS: Gallbladder:  Poorly distended. No gallstones or wall thickening visualized. No sonographic Murphy sign noted by sonographer.  Common bile duct:  Diameter: 2.3 mm  Liver:  Diffusely echogenic. Corresponding low density on the recent CT. The recently demonstrated small liver hemangioma was not visible today with some limitation due to body habitus and deep breathing. Portal vein is patent on color Doppler imaging with normal direction of blood flow towards the liver.  Other: None.  IMPRESSION: Diffuse hepatic steatosis.  Electronically Signed   By: Elspeth Bathe M.D.   On: 12/03/2023 16:05 CT Head Wo Contrast CLINICAL DATA:  Lethargy  EXAM: CT HEAD WITHOUT CONTRAST  TECHNIQUE: Contiguous axial images were obtained from the base of the skull through the vertex without intravenous contrast.  RADIATION DOSE REDUCTION: This exam was performed according to the departmental dose-optimization program which includes automated exposure control,  adjustment of the mA and/or kV according to patient size and/or use of iterative reconstruction technique.  COMPARISON:  None Available.  FINDINGS: CT HEAD:  There is an old infarct in the right occipital lobe with encephalomalacia.  There is no hemorrhage.  No acute ischemic changes.  No mass lesion.  The ventricles are normal.  Skull/sinuses/orbits:  No significant abnormality.  IMPRESSION: Old infarct in the right occipital lobe with encephalomalacia.  No acute abnormality.  Electronically Signed   By: Nancyann Burns M.D.   On: 12/03/2023 11:55 DG Chest Port 1 View CLINICAL DATA:  Weakness.  EXAM: PORTABLE CHEST 1 VIEW  COMPARISON:  12/16/2019  FINDINGS: Mild enlargement of the cardiac silhouette with an interval increase in size. Increased prominence of the pulmonary vasculature. Small amount of patchy density at the left lung base. Otherwise, clear lungs. Right jugular porta catheter tip in the inferior aspect of the superior vena cava. Mild-to-moderate dextroconvex scoliosis.  IMPRESSION: 1. Interval mild cardiomegaly and pulmonary vascular congestion. 2. Small amount of patchy atelectasis or pneumonia at the left lung base.  Electronically Signed   By: Elspeth Bathe M.D.   On: 12/03/2023 11:14    Pertinent GI Studies   Flexible sigmoidoscopy 2023/03/13for follow-up on rectal cancer - Perianal skin tags found on perianal exam. - Severe, non-traversable stricture in the rectum. Biopsied. - A tattoo was seen in the rectum. - One 3 mm polyp in the distal rectum, removed with a cold snare. Resected and retrieved. - Non-bleeding internal hemorrhoids.  Rectal biopsies were benign/nonspecific.     Past Medical History:  Diagnosis Date   Allergic rhinitis    [facility]   Anxiety    Benign essential hypertension    [facility]   Colon cancer [facility]) 2021   Coronary artery disease     [facility]   Diabetic neuropathy, type II diabetes mellitus (HCC)    [facility]   Dysrhythmia    Iron deficiency anemia due to chronic blood loss 11/18/2019   Mixed hyperlipidemia    [facility]   Myocardial infarct [facility])    [facility]   Obesity, unspecified    [facility]   Persistent atrial fibrillation [facility])    Rectal bleeding    [facility]   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) 11/17/2019   Stroke (HCC)    Wide-complex tachycardia 06/04/2019    Past Surgical History:  Procedure Laterality Date   BIOPSY  10/07/2019   Procedure: BIOPSY;  Surgeon: San Sandor GAILS, DO;  Location: [facility];  Service: Gastroenterology;;   COLONOSCOPY WITH PROPOFOL  N/A 10/07/2019   Procedure: COLONOSCOPY WITH PROPOFOL ;  Surgeon: San Sandor GAILS, DO;  Location: [facility];  Service: Gastroenterology;  Laterality: N/A;   CORONARY ANGIOPLASTY     3 stents   IR IMAGING GUIDED PORT INSERTION  11/24/2019   LEFT HEART CATH AND CORONARY ANGIOGRAPHY N/A 06/05/2019   Procedure: LEFT HEART CATH AND CORONARY ANGIOGRAPHY;  Surgeon: Mady Bruckner, MD;  Location: [facility];  Service: Cardiovascular;  Laterality: N/A;   POLYPECTOMY  10/07/2019   Procedure: POLYPECTOMY;  Surgeon: San Sandor GAILS, DO;  Location: [facility];  Service: Gastroenterology;;   SUBMUCOSAL TATTOO INJECTION  10/07/2019   Procedure: SUBMUCOSAL TATTOO INJECTION;  Surgeon: San Sandor GAILS, DO;  Location: [facility];  Service: Gastroenterology;;    Family History  Problem Relation Age of Onset   Diverticulitis Mother    Hypertension Father    Heart disease Father        MI   Prostate cancer Father    Colon cancer Neg Hx    Stomach cancer Neg Hx    Pancreatic cancer Neg Hx    Rectal cancer Neg Hx     Prior to Admission medications   Medication Sig Start Date End Date Taking? Authorizing  Provider  acetaminophen  (TYLENOL ) 500 MG tablet Take 500-1,000 mg by mouth 2 (two) times daily as needed for moderate pain (pain score 4-6), fever or headache.   Yes [provider]  diltiazem  (CARDIZEM  CD) 240 MG 24 hr capsule TAKE 1 CAPSULE BY MOUTH EVERY DAY 04/29/23  Yes End, Lonni, MD  ELIQUIS  5 MG TABS tablet TAKE 1 TABLET BY MOUTH TWICE A DAY 10/08/23  Yes Ennever, Maude SAUNDERS, MD  ezetimibe  (ZETIA ) 10 MG tablet Take 1 tablet by mouth once daily Patient taking differently: Take 10 mg by mouth every evening. 06/20/20  Yes End, Lonni, MD  fluticasone  (FLONASE ) 50 MCG/ACT nasal spray Place 1 spray into both nostrils 2 (two) times daily as needed (sinus congestion). 12/14/20  Yes End, Lonni, MD  furosemide  (LASIX ) 40 MG tablet TAKE 1 TABLET BY MOUTH EVERY DAY 07/11/23  Yes End, Lonni, MD  glimepiride  (AMARYL ) 1 MG tablet Take 1 mg by mouth daily. 07/31/20  Yes [provider]  HYDROcodone -acetaminophen  (NORCO) 5-325 MG tablet Take 1 tablet by mouth every 6 (six) hours as needed for moderate pain (pain score 4-6). 03/08/23  Yes Ennever, Maude SAUNDERS, MD  metoprolol  succinate (TOPROL -XL) 50 MG 24 hr tablet TAKE 1 TABLET BY MOUTH EVERY DAY Patient taking differently: Take 50 mg by mouth every evening. 08/13/23  Yes End, Lonni, MD  trifluridine -tipiracil  (LONSURF ) 15-6.14 MG tablet Take 4 tablets (60 mg of trifluridine  total) by mouth 2 (two) times daily after a meal. Take within 1 hr after AM & PM meals on days 1-5, 8-12. Repeat every 28 days. 09/20/23  Yes Timmy Maude SAUNDERS, MD    Current Facility-Administered Medications  Medication Dose Route Frequency Provider Last Rate Last Admin   insulin  aspart (novoLOG ) injection 0-9 Units  0-9 Units Subcutaneous TID WC Alena Morrison, Reagan, MD       pantoprazole  (PROTONIX ) injection 40 mg  40 mg Intravenous Q12H Everhart, Kirstie, DO   40 mg at 12/03/23 1430   Current Outpatient Medications  Medication Sig Dispense  Refill   acetaminophen  (TYLENOL ) 500 MG tablet Take 500-1,000 mg by mouth 2 (two) times daily as needed for moderate pain (pain score 4-6), fever or headache.     diltiazem  (CARDIZEM  CD) 240 MG 24 hr capsule TAKE 1 CAPSULE BY MOUTH EVERY DAY 90 capsule 3   ELIQUIS  5 MG TABS tablet TAKE 1 TABLET BY MOUTH TWICE A DAY 60 tablet 5   ezetimibe  (ZETIA ) 10 MG tablet Take 1 tablet by mouth once daily (Patient taking differently: Take 10 mg by mouth every evening.) 90 tablet 3   fluticasone  (FLONASE ) 50 MCG/ACT nasal spray Place 1 spray into both nostrils 2 (two) times daily as needed (sinus congestion). 18.2 mL 3   furosemide  (LASIX ) 40 MG tablet TAKE 1 TABLET BY MOUTH EVERY DAY 90 tablet 3   glimepiride  (AMARYL ) 1 MG tablet Take 1 mg by mouth daily.     HYDROcodone -acetaminophen  (NORCO) 5-325 MG tablet Take 1 tablet by mouth every 6 (six) hours as needed for moderate pain (pain score 4-6). 90 tablet 0   metoprolol  succinate (TOPROL -XL) 50 MG 24 hr tablet TAKE 1 TABLET BY MOUTH EVERY DAY (Patient taking differently: Take 50 mg by mouth every evening.) 90 tablet 3   trifluridine -tipiracil  (LONSURF ) 15-6.14 MG tablet Take 4 tablets (60 mg of trifluridine  total) by mouth 2 (two) times daily after a meal. Take within 1 hr after AM & PM meals on days 1-5, 8-12. Repeat every 28 days. 80 tablet 4   Facility-Administered Medications Ordered in Other Encounters  Medication Dose Route Frequency Provider Last Rate Last Admin   sodium chloride  flush (NS) 0.9 % injection 10 mL  10  mL Intravenous PRN Franchot Lauraine HERO, NP   10 mL at 02/14/21 1054    Allergies as of 12/03/2023 - Review Complete 12/03/2023  Allergen Reaction Noted   Aleve [naproxen] Other (See Comments) 06/04/2019   Celexa  [citalopram ] Other (See Comments) 12/16/2019   Motrin [ibuprofen] Other (See Comments) 06/04/2019   Yellow jacket venom [bee venom] Swelling and Other (See Comments) 06/04/2019   Penicillins Hives 06/04/2019   Xgeva  [denosumab ]  Other (See Comments) 05/28/2023   Glucophage [metformin] Diarrhea and Other (See Comments)     Social History   Socioeconomic History   Marital status: Divorced    Spouse name: Not on file   Number of children: 2   Years of education: Not on file   Highest education level: Not on file  Occupational History   Occupation: retired  Tobacco Use   Smoking status: Never   Smokeless tobacco: Former    Types: Chew    Quit date: 05/04/2019  Vaping Use   Vaping status: Never Used  Substance and Sexual Activity   Alcohol use: Not Currently    Comment: occasional   Drug use: Yes    Types: Marijuana    Comment: occassionally   Sexual activity: Not Currently  Other Topics Concern   Not on file  Social History Narrative   Divorced with 2 adult children   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   No tobacco   + marijuana to treat anxiety   occ EtOH   Social Drivers of Corporate investment banker Strain: Not on file  Food Insecurity: No Food Insecurity (03/04/2023)   Hunger Vital Sign    Worried About Running Out of Food in the Last Year: Never true    Ran Out of Food in the Last Year: Never true  Transportation Needs: No Transportation Needs (03/04/2023)   PRAPARE - Administrator, Civil Service (Medical): No    Lack of Transportation (Non-Medical): No  Physical Activity: Not on file  Stress: Not on file  Social Connections: Not on file  Intimate Partner Violence: Not At Risk (03/04/2023)   Humiliation, Afraid, Rape, and Kick questionnaire    Fear of Current or Ex-Partner: No    Emotionally Abused: No    Physically Abused: No    Sexually Abused: No     Code Status   Code Status: Full Code  Review of Systems: All systems reviewed and negative except where noted in HPI.  Physical Exam: Vital signs in last 24 hours: Temp:  [97.7 F (36.5 C)-98.4 F (36.9 C)] 97.7 F (36.5 C) (08/05 1537) Pulse Rate:  [29-146] 126 (08/05 1615) Resp:   [17-24] 18 (08/05 1615) BP: (88-128)/(59-86) 128/78 (08/05 1615) SpO2:  [94 %-100 %] 100 % (08/05 1615) Weight:  [97.5 kg] 97.5 kg (08/05 0946)    General:  Pleasant male in NAD Psych:  Cooperative. Normal mood and affect Eyes: Pupils equal Ears:  Normal auditory acuity Nose: No deformity, discharge or lesions Neck:  Supple, no masses felt Lungs:  Clear to auscultation.  Heart:  Regular rate, irregular rhythm.  Abdomen:  Soft, protuberant , nontender, active bowel sounds, no masses felt Rectal :  Deferred Msk: Symmetrical without gross deformities.  Neurologic:  Alert, oriented, grossly normal neurologically Extremities : No edema Skin:  Intact without significant lesions.    Intake/Output from previous day: No intake/output data recorded. Intake/Output this shift:  No intake/output data recorded.   Vina Dasen, NP-C   12/03/2023,  4:44 PM

## 2023-12-03 NOTE — ED Notes (Signed)
 CCMD contacted

## 2023-12-03 NOTE — Progress Notes (Addendum)
  FMTS Attending Admission Note: John Keeling, MD  Personal pager:  [phone] FPTS Service Pager:  [phone]   I  have personally seen and examined this patient, reviewed their chart and results. I have discussed this patient with the resident. I agree with the resident's findings, assessment and care plan as documented in the resident's note, clarifications or changes to the resident's note are listed below.   John Douglas is a 68 yo M with PMH of metastatic adenocarcinoma of the rectum on oral Lonsurf , iron deficiency anemia, CVA, h/o MI, Afib on Eliquis  who presents with several days of lethargy. He denies fevers, chills, abd pain, melena, hematochezia, cough. Daughter at bedside notes he was weak and unable to get around on his own, he lives by himself and usually independent. He notse he has been eating and drinking less as well as urinating less the last several days, but denies difficulty with urination.  On exam, pale conjunctiva, heart irregularly irregular tachycardic. Lungs CTAB. Abd soft, mildly distended, + bowel sounds,no guarding or rebound.   EKG Afib, ST depression in II, aVF, V4-V6, no STE.  CXR LLL atelectasis versus pneumonia, with cardiomegaly and pulmonary congestion. CT head without acute abnormality, old infarct present.   1. Acute on chronic normocytic anemia- suspect in setting of GI bleed, pt denies melena or hematochezia. He is on Eliquis . No other signs or symptoms of bleeding. Also considered in setting of cytopenia due to chemotherapy, but Hgb was 10 one month ago. Smear, LDH, haptoglobin, reticulocyte count pending. 3 units pRBCs ordered for transfusion, transfusion threshold <8 due to history of MI. BID IV PPI. 2. HAGMA, Lactic Acidosis- suspect in setting of hypoperfusion 2/2 anemia. Will trend, improving with transfusion. Also consider sepsis 2/2 infection, s/p azithro/Rocephin  in ED and fluid resuscitation. 3. Afib with RVR, Elevated troponin- trending down, pt  asymptomatic, suspect in setting of demand with severe anemia, however does have ST depression on EKG so NSTEMI in differential. Cannot anticoagulate due to severe anemia, also with some hypotension will ask cardiology to weigh in but again this is likely appropriate response to anemia. Holding home dilt and metoprolol  due to hypotension. 4. AKI - no history of CKD. Suspect pre-renal in setting of poor PO intake and possible bleeding as above. Urinalysis pending, will get bladder scan to ensure no urinary retention. 5. History of rectal cancer- hold home oral chemotherapy. 6. Atelectasis in left lower lobe- versus pneumonia, pt is fatigued and meeting sepsis criteria although could also be due to anemia. Covered for CAP with Rocephin /azithromycin .  7. Transaminitis- could be in setting of hypotension and poor perfusion, pt asymptomatic. RUQ sono and hepatitis panel pending.

## 2023-12-03 NOTE — ED Triage Notes (Signed)
 Pt presents to ED via EMS from home. Daughter called out stating that the pt was breathing differently. No respiratory distress reported. Hx colon cancer with ongoing treatment. Lethargy reported by pt. Atrial fibrillation on the monitor. Reports feeling thirsty.  CBG 336 -> 342 en route with no hx of diabetes.

## 2023-12-03 NOTE — Progress Notes (Signed)
 ANTICOAGULATION CONSULT NOTE  Pharmacy Consult for Heparin  Indication: atrial fibrillation  Allergies  Allergen Reactions   Aleve [Naproxen] Other (See Comments)    Told to avoid by MD   Celexa  [Citalopram ] Other (See Comments)    Contraindicated with A-Fib   Motrin [Ibuprofen] Other (See Comments)    Told to avoid by MD   Yellow Jacket Venom [Bee Venom] Swelling and Other (See Comments)    Severe swelling where stung   Penicillins Hives   Xgeva  [Denosumab ] Other (See Comments)    Runny nose and epistaxis  Lower back pain Watery discharge from eyes Raw sinuses   Glucophage [Metformin] Diarrhea and Other (See Comments)    GI bleeding Blood stool    Patient Measurements: Height: 5' 10 (177.8 cm) Weight: 97.5 kg (215 lb) IBW/kg (Calculated) : 73 Heparin  Dosing Weight: 93.1 kg  Vital Signs: Temp: 98.4 F (36.9 C) (08/05 1700) Temp Source: Oral (08/05 1700) BP: 114/69 (08/05 1715) Pulse Rate: 100 (08/05 1715)  Labs: Recent Labs    12/03/23 0957 12/03/23 1157 12/03/23 1647  HGB 3.6*  --  6.6*  HCT 10.5*  --  18.9*  PLT 253  --   --   APTT  --   --  43*  LABPROT  --   --  29.0*  INR  --   --  2.6*  CREATININE 2.08*  --   --   TROPONINIHS 520* 498*  --     Estimated Creatinine Clearance: 39.3 mL/min (A) (by C-G formula based on SCr of 2.08 mg/dL (H)).   Medical History: Past Medical History:  Diagnosis Date   Allergic rhinitis    [facility]   Anxiety    Benign essential hypertension    [facility]   Colon cancer [facility]) 2021   Coronary artery disease    [facility]   Diabetic neuropathy, type II diabetes mellitus (HCC)    [facility]   Dysrhythmia    Iron deficiency anemia due to chronic blood loss 11/18/2019   Mixed hyperlipidemia    [facility]   Myocardial infarct [facility])    [facility]   Obesity, unspecified    [facility]   Persistent atrial fibrillation [facility])    Rectal bleeding    [facility]   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) 11/17/2019   Stroke (HCC)    Wide-complex tachycardia 06/04/2019    Medications:  (Not in a hospital admission)  Scheduled:   sodium chloride    Intravenous Once   insulin  aspart  0-9 Units Subcutaneous TID WC   pantoprazole  (PROTONIX ) IV  40 mg Intravenous Q12H   Infusions:  PRN:   Assessment: 67 yom with a history of AF, CVA, colon cancer, CAD, HTN, HLD, ischemic cardiomyopathy, T2DM. Patient is presenting with AF. Heparin  per pharmacy consult placed for atrial fibrillation.  Patient being seen by GI for concern for GIB with low hemoglobin requiring blood transfusion. Patient is on apixaban  prior to arrival. Last dose 8/4 1800. Will require aPTT monitoring due to likely falsely high anti-Xa level secondary to DOAC use.  Discussed with Dr. Stoney, GI requesting bridge with heparin  and hold apixaban . GI requests no missed anticoagulation citing high stroke risk and patient is worried. They are requesting additional transfusion and begin heparin . After discussing with Dr. Stoney will opt for lower goal levels until bleeding risk is lower.  Hgb 3.3>6.6;  plt 253 aPTT 43 PT/INR 29/2.6  Goal of Therapy:  Heparin  level 0.3-0.5 units/ml aPTT 66-85seconds Monitor platelets by anticoagulation protocol: Yes   Plan:  No initial heparin  bolus Start heparin  infusion at 1000 units/hr Check aPTT & anti-Xa level at 0100 Daily levels while on heparin  Continue to monitor via aPTT until levels are correlated Continue to monitor H&H and platelets  Dorn Buttner, PharmD, BCPS 12/03/2023 6:18 PM ED Clinical Pharmacist -  [phone]

## 2023-12-03 NOTE — ED Notes (Signed)
 Phleb stuck twice, was unable to get blood

## 2023-12-03 NOTE — Assessment & Plan Note (Addendum)
 EKG with concern for new ST depression in the setting of elevated but downtrending troponin. Currently asymptomatic. - trending troponins - cardiology consulted

## 2023-12-03 NOTE — Assessment & Plan Note (Addendum)
 Rates range from 100-130 in the ED. He is on diltiazem  and metoprolol  at home and did not take those meds today. Hesitant to restart them due to soft BP. Do not want to anticoagulate given suspicion for GI bleed - consulted cardiology for assistance with rate control in the setting of severe, symptomatic anemia - echo ordered

## 2023-12-03 NOTE — Plan of Care (Signed)
 Spoke with GI who would like another unit of blood and heparin  gtt ordered due to afib w/ history of stroke. They plan to scope so will d/c heparin  prior to that

## 2023-12-03 NOTE — Consult Note (Addendum)
 Cardiology Consultation   Patient ID: John Douglas MRN: [id number]; DOB: 1955-08-29  Admit date: 12/03/2023 Date of Consult: 12/03/2023  PCP:  Stephanie Charlene CROME, MD   [facility] Providers Cardiologist:  Lonni Hanson, MD     CC: breathing differently Reason of Consult: Afib Requesting Provider: Dr. Donzetta  Patient Profile: John Douglas is a 68 y.o. male with a hx of atrial fibrillation, history of CVA, metastatic adenocarcinoma of the rectum (oncology note 10/24/2023), CAD, HTN, HLD, ischemic cardiomyopathy, type 2 DM who is being seen 12/03/2023 for the evaluation of atrial fibrillation at the request of Dr. Donzetta.  History of Present Illness: John Douglas is a 68 year old male with above medical history who is followed by Dr. Hanson. In 03/2019, patient admitted with inferior STEMI. Underwent PCI to the RCA. This was complicated by sustained ventricular tachycardia in the setting of residual left coronary artery disease. Underwent subsequent PCI of the mid LAD in 06/2019.   Patient also has a history of atrial fibrillation. Has had recurrent GI bleeding in the past, likely due to rectal cancer. Had a stroke in 09/2019 that was felt to be cardioembolic. At that time, patient was not on DOAC because of GI bleeding. Felt he could not start anticoagulation until his rectal mass was treated and hematochezia had resolved. Due to lack of anticoagulation, he was managed with a rate control strategy. Eventually started eliquis  in 01/2020.   In 09/2022, patient wore a cardiac monitor that showed 100% atrial fibrillation with average HR 95 BPM. Discussed cardioversion, but patient declined. Also discussed pharmacologic rhythm control, but patient declined. Remained on diltiazem  and metoprolol  for HR control. In 12/2022, CT imaging was consistent with recurrence of his rectal cancer. Followed by Dr. Timmy.   Last saw Dr. Hanson in 06/2023. At that time, patient continued to undergo chemotherapy. HE  denied bleeding on eliquis . Remained in atrial fibrillation with well controlled HR. Continued diltiazem , metoprolol , zetia . Was on lasix  40 mg for HFpEF.   Patient presented to the ED on 8/5. Patient's daughter noted that he was breathing differently. Patient reported lethargy. Initial vital signs showed HR 109 BPM, oxygen 100% on room air, BP 100/67.  EKG showed atrial fibrillation with HR 119 BPM, diffuse ST depression. Labs significant for: Creatinine 2.08 AST 164, ALT 217 Hemoglobin 3.6  Lactic Acid 6.0>5.0  hsTn 520>498   CXR showed mild cardiomegaly and pulmonary vascular congestion, small amount of patchy atelectasis vs pneumonia at the left lung base.   Patient presented to the ED accompanied by his daughter.  Daughter reports that patient has been weak for the past 2 weeks or so, has worsened significantly over the past 2 days.  Patient has also been lethargic and very fatigued.  Patient reports feeling like he needs a good nap.  Denies dizziness, syncope, near syncope.  He denies known bleeding.  Reports some mild chest discomfort.  He occasionally feels like his heart rate is irregular, but overall denies palpitations.   Past Medical History:  Diagnosis Date   Allergic rhinitis    [facility]   Anxiety    Benign essential hypertension    [facility]   Colon cancer [facility]) 2021   Coronary artery disease    [facility]   Diabetic neuropathy, type II diabetes mellitus (HCC)    [facility]   Dysrhythmia    Iron deficiency anemia due to chronic blood loss 11/18/2019   Mixed hyperlipidemia  [facility]   Myocardial infarct [facility])    [facility]   Obesity, unspecified    [facility]   Persistent atrial fibrillation [facility])    Rectal bleeding    [facility]   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)  11/17/2019   Stroke (HCC)    Wide-complex tachycardia 06/04/2019    Past Surgical History:  Procedure Laterality Date   BIOPSY  10/07/2019   Procedure: BIOPSY;  Surgeon: San Sandor GAILS, DO;  Location: [facility];  Service: Gastroenterology;;   COLONOSCOPY WITH PROPOFOL  N/A 10/07/2019   Procedure: COLONOSCOPY WITH PROPOFOL ;  Surgeon: San Sandor GAILS, DO;  Location: [facility];  Service: Gastroenterology;  Laterality: N/A;   CORONARY ANGIOPLASTY     3 stents   IR IMAGING GUIDED PORT INSERTION  11/24/2019   LEFT HEART CATH AND CORONARY ANGIOGRAPHY N/A 06/05/2019   Procedure: LEFT HEART CATH AND CORONARY ANGIOGRAPHY;  Surgeon: Mady Bruckner, MD;  Location: [facility];  Service: Cardiovascular;  Laterality: N/A;   POLYPECTOMY  10/07/2019   Procedure: POLYPECTOMY;  Surgeon: San Sandor GAILS, DO;  Location: [facility];  Service: Gastroenterology;;   SUBMUCOSAL TATTOO INJECTION  10/07/2019   Procedure: SUBMUCOSAL TATTOO INJECTION;  Surgeon: San Sandor GAILS, DO;  Location: [facility];  Service: Gastroenterology;;     Home Medications:  Prior to Admission medications   Medication Sig Start Date End Date Taking? Authorizing Provider  acetaminophen  (TYLENOL ) 500 MG tablet Take 500-1,000 mg by mouth 2 (two) times daily as needed for moderate pain (pain score 4-6), fever or headache.   Yes [provider]  diltiazem  (CARDIZEM  CD) 240 MG 24 hr capsule TAKE 1 CAPSULE BY MOUTH EVERY DAY 04/29/23  Yes End, Bruckner, MD  ELIQUIS  5 MG TABS tablet TAKE 1 TABLET BY MOUTH TWICE A DAY 10/08/23  Yes Ennever, Maude SAUNDERS, MD  ezetimibe  (ZETIA ) 10 MG tablet Take 1 tablet by mouth once daily Patient taking differently: Take 10 mg by mouth every evening. 06/20/20  Yes End, Bruckner, MD  fluticasone  (FLONASE ) 50 MCG/ACT nasal spray Place 1 spray into both nostrils 2 (two) times daily as needed (sinus congestion). 12/14/20  Yes End, Bruckner, MD  furosemide  (LASIX ) 40 MG tablet TAKE 1  TABLET BY MOUTH EVERY DAY 07/11/23  Yes End, Bruckner, MD  glimepiride  (AMARYL ) 1 MG tablet Take 1 mg by mouth daily. 07/31/20  Yes [provider]  HYDROcodone -acetaminophen  (NORCO) 5-325 MG tablet Take 1 tablet by mouth every 6 (six) hours as needed for moderate pain (pain score 4-6). 03/08/23  Yes Ennever, Maude SAUNDERS, MD  metoprolol  succinate (TOPROL -XL) 50 MG 24 hr tablet TAKE 1 TABLET BY MOUTH EVERY DAY Patient taking differently: Take 50 mg by mouth every evening. 08/13/23  Yes End, Bruckner, MD  trifluridine -tipiracil  (LONSURF ) 15-6.14 MG tablet Take 4 tablets (60 mg of trifluridine  total) by mouth 2 (two) times daily after a meal. Take within 1 hr after AM & PM meals on days 1-5, 8-12. Repeat every 28 days. 09/20/23  Yes Timmy Maude SAUNDERS, MD    Scheduled Meds:  sodium chloride    Intravenous Once   insulin  aspart  0-9 Units Subcutaneous TID WC   pantoprazole  (PROTONIX ) IV  40 mg Intravenous Q12H   [START ON 12/04/2023] polyethylene glycol-electrolytes  4,000 mL Oral Once   Continuous Infusions:  heparin      PRN Meds:   Allergies:    Allergies  Allergen Reactions   Aleve [  Naproxen] Other (See Comments)    Told to avoid by MD   Celexa  [Citalopram ] Other (See Comments)    Contraindicated with A-Fib   Motrin [Ibuprofen] Other (See Comments)    Told to avoid by MD   Yellow Jacket Venom [Bee Venom] Swelling and Other (See Comments)    Severe swelling where stung   Penicillins Hives   Xgeva  [Denosumab ] Other (See Comments)    Runny nose and epistaxis  Lower back pain Watery discharge from eyes Raw sinuses   Glucophage [Metformin] Diarrhea and Other (See Comments)    GI bleeding Blood stool    Social History:   Social History   Socioeconomic History   Marital status: Divorced    Spouse name: Not on file   Number of children: 2   Years of education: Not on file   Highest education level: Not on file  Occupational History   Occupation: retired  Tobacco Use    Smoking status: Never   Smokeless tobacco: Former    Types: Chew    Quit date: 05/04/2019  Vaping Use   Vaping status: Never Used  Substance and Sexual Activity   Alcohol use: Not Currently    Comment: occasional   Drug use: Yes    Types: Marijuana    Comment: occassionally   Sexual activity: Not Currently  Other Topics Concern   Not on file  Social History Narrative   Divorced with 2 adult children   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   No tobacco   + marijuana to treat anxiety   occ EtOH   Social Drivers of Corporate investment banker Strain: Not on file  Food Insecurity: No Food Insecurity (03/04/2023)   Hunger Vital Sign    Worried About Running Out of Food in the Last Year: Never true    Ran Out of Food in the Last Year: Never true  Transportation Needs: No Transportation Needs (03/04/2023)   PRAPARE - Administrator, Civil Service (Medical): No    Lack of Transportation (Non-Medical): No  Physical Activity: Not on file  Stress: Not on file  Social Connections: Not on file  Intimate Partner Violence: Not At Risk (03/04/2023)   Humiliation, Afraid, Rape, and Kick questionnaire    Fear of Current or Ex-Partner: No    Emotionally Abused: No    Physically Abused: No    Sexually Abused: No    Family History:   Family History  Problem Relation Age of Onset   Diverticulitis Mother    Hypertension Father    Heart disease Father        MI   Prostate cancer Father    Colon cancer Neg Hx    Stomach cancer Neg Hx    Pancreatic cancer Neg Hx    Rectal cancer Neg Hx      ROS:  Review of Systems  Constitutional: Positive for malaise/fatigue.  Cardiovascular:  Negative for chest pain, claudication, irregular heartbeat, leg swelling, near-syncope, orthopnea, palpitations, paroxysmal nocturnal dyspnea and syncope.  Respiratory:  Negative for shortness of breath.   Hematologic/Lymphatic: Negative for bleeding problem.  Gastrointestinal:   Negative for hematochezia and melena.       Physical Exam/Data: Vitals:   12/03/23 1615 12/03/23 1700 12/03/23 1715 12/03/23 1911  BP: 128/78 111/77 114/69 119/81  Pulse: (!) 126 (!) 104 100 (!) 101  Resp: 18 17 16 13   Temp:  98.4 F (36.9 C)  98.5 F (36.9 C)  TempSrc:  Oral  Oral  SpO2: 100% 100% 100% 97%  Weight:      Height:        Intake/Output Summary (Last 24 hours) at 12/03/2023 2028 Last data filed at 12/03/2023 1911 Gross per 24 hour  Intake --  Output 250 ml  Net -250 ml      12/03/2023    9:46 AM 10/24/2023    1:00 PM 09/20/2023    3:00 PM  Last 3 Weights  Weight (lbs) 215 lb 215 lb 215 lb  Weight (kg) 97.523 kg 97.523 kg 97.523 kg     Body mass index is 30.85 kg/m.   Note- physical exam somewhat limited as patient was having a liver ultrasound completed.  General:  Well nourished, well developed, in no acute distress. Laying in the bed with head elevated  HEENT: normal Neck: no JVD Vascular: No carotid bruits; Radial pulses 2+ bilaterally Cardiac:  normal S1, S2; irregular rate and rhythm, tachycardic.  Lungs:  clear to auscultation bilaterally, no wheezing, rhonchi or rales. Breathing unlabored  Abd: soft, nontender  Ext: no edema in BLE  Musculoskeletal:  No deformities Skin: warm and dry  Neuro:  CNs 2-12 intact, no focal abnormalities noted Psych:  Normal affect   EKG:  The EKG was personally reviewed and demonstrates:  Atrial fibrillation, HR 119 BPM Telemetry:  Telemetry was personally reviewed and demonstrates:  Atrial fibrillation, HR in the 90s-110s   Relevant CV Studies: Cardiac Studies & Procedures   ______________________________________________________________________________________________ CARDIAC CATHETERIZATION  CARDIAC CATHETERIZATION 06/05/2019  Conclusion Conclusions: 1. Significant single-vessel coronary artery disease with diffuse mid LAD disease of up to 80-90%, extending into the ostium of D1. 2. Mild to moderate,  non-obstructive coronary artery disease involving the proximal LAD, LCx and RCA. 3. Widely patent overlapping stents in the RCA extending from the ostium through the distal vessel. 4. Successful PCI to the mid LAD using Synergy 2.5 x 38 mm drug-eluting stent (post-dilated to 2.9 mm) with 0% residual stenosis and TIMI-3 flow.  Jailed D2 branch exhibits 90% ostial stenosis but TIMI-3 flow.  This branch was not intervened upon.  Recommendations: 1. Dual antiplatelet therapy with aspirin  and ticagrelor  for at least 3 months, at which point we could consider stopping aspirin  given report of hematochezia. 2. I favor holding apixaban  going further, as I worry that the risk of GI bleeding outweight the benefit at this time. 3. Aggressive secondary prevention.  Lonni Hanson, MD [facility]  Findings Coronary Findings Diagnostic  Dominance: Right  Left Main Vessel is large. Dist LM to Ost LAD lesion is 20% stenosed.  Left Anterior Descending Vessel is moderate in size. Prox LAD lesion is 45% stenosed. Mid LAD-1 lesion is 80% stenosed. Mid LAD-2 lesion is 90% stenosed.  First Diagonal Branch Vessel is moderate in size.  Second Diagonal Branch Vessel is moderate in size. 2nd Diag lesion is 70% stenosed.  Third Diagonal Branch Vessel is small in size.  Left Circumflex Vessel is large. Ost Cx to Prox Cx lesion is 50% stenosed with 40% stenosed side branch in 1st Mrg.  First Obtuse Marginal Branch Vessel is large in size.  Second Obtuse Marginal Branch Vessel is small in size.  Third Obtuse Marginal Branch Vessel is moderate in size. 3rd Mrg lesion is 30% stenosed.  Fourth Obtuse Marginal Branch Vessel is small in size.  Right Coronary Artery Vessel is large. Previously placed Ost RCA to Dist RCA stent (unknown type) is widely patent. Dist RCA lesion is 25%  stenosed. The lesion is eccentric.  Right Posterior Descending Artery Vessel is moderate in size.  Right  Posterior Atrioventricular Artery Vessel is moderate in size. RPAV lesion is 30% stenosed.  Intervention  Mid LAD-1 lesion Stent (Also treats lesions: Mid LAD-2) A drug-eluting stent was successfully placed using a SYNERGY XD [id number]. Post-Intervention Lesion Assessment The intervention was successful. Pre-interventional TIMI flow is 3. Post-intervention TIMI flow is 3. No complications occurred at this lesion. There is a 0% residual stenosis post intervention.  Mid LAD-2 lesion Stent (Also treats lesions: Mid LAD-1) See details in Mid LAD-1 lesion. Post-Intervention Lesion Assessment The intervention was successful. Pre-interventional TIMI flow is 3. Post-intervention TIMI flow is 3. No complications occurred at this lesion. There is a 0% residual stenosis post intervention.     ECHOCARDIOGRAM  ECHOCARDIOGRAM LIMITED 05/04/2020  Narrative ECHOCARDIOGRAM LIMITED REPORT    Patient Name:   PAARTH CROPPER Date of Exam: 05/04/2020 Medical Rec #:  [id number]     Height:       70.0 in Accession #:    [id number]    Weight:       224.0 lb Date of Birth:  1955/06/14    BSA:          2.190 m Patient Age:    64 years      BP:           124/70 mmHg Patient Gender: M             HR:           100 bpm. Exam Location:  [place]  Procedure: Limited Echo, Intracardiac Opacification Agent and Limited Color Doppler  Indications:    I48.92* Unspecified atrial flutter  History:        Patient has prior history of Echocardiogram examinations, most recent 10/09/2019. Cardiomyopathy and CHF, CAD, Stroke, Arrythmias:Atrial Flutter; Risk Factors:Hypertension, Dyslipidemia and Non-Smoker.  Sonographer:    Arley Pac RDMS, RVT, RDCS Referring Phys: [id number] CHRISTOPHER END  IMPRESSIONS   1. Left ventricular ejection fraction, by estimation, is 50 to 55%. The left ventricle has low normal function. The left ventricle has no regional wall motion abnormalities. 2. Right ventricular systolic  function is normal. The right ventricular size is normal. 3. The mitral valve is grossly normal. No evidence of mitral valve regurgitation.  FINDINGS Left Ventricle: Left ventricular ejection fraction, by estimation, is 50 to 55%. The left ventricle has low normal function. The left ventricle has no regional wall motion abnormalities. Definity  contrast agent was given IV to delineate the left ventricular endocardial borders. The left ventricular internal cavity size was normal in size. There is no left ventricular hypertrophy.  Right Ventricle: The right ventricular size is normal. Right ventricular systolic function is normal.  Mitral Valve: The mitral valve is grossly normal.  Pulmonic Valve: The pulmonic valve was not well visualized. Pulmonic valve regurgitation is not visualized.  LEFT VENTRICLE PLAX 2D LVIDd:         5.00 cm LVIDs:         3.70 cm LV PW:         1.10 cm LV IVS:        0.90 cm   Redell Cave MD Electronically signed by Redell Cave MD Signature Date/Time: 05/04/2020/6:05:27 PM    Final    MONITORS  LONG TERM MONITOR (3-14 DAYS) 10/15/2022  Narrative   The patient was monitored for 7 days, 2 hours.   The predominant rhythm was coarse atrial fibrillation versus atrial flutter (  100% burden) with an average ventricular rate of 95 bpm (range 60-152 bpm).   There were occasional PVCs versus aberrantly conducted beats (2.5% burden).   No prolonged pause was observed.   Patient triggered event corresponds to atrial fibrillation/flutter.  Persistent coarse atrial fibrillation versus atrial flutter with variable conduction, as detailed above.  Occasional PVCs versus aberrantly conducted beats observed.       ______________________________________________________________________________________________       Laboratory Data: High Sensitivity Troponin:   Recent Labs  Lab 12/03/23 0957 12/03/23 1157  TROPONINIHS 520* 498*     Chemistry Recent  Labs  Lab 12/03/23 0957  NA 136  K 3.6  CL 101  CO2 19*  GLUCOSE 240*  BUN 55*  CREATININE 2.08*  CALCIUM  9.0  GFRNONAA 34*  ANIONGAP 16*    Recent Labs  Lab 12/03/23 0957  PROT 6.0*  ALBUMIN 3.3*  AST 164*  ALT 217*  ALKPHOS 47  BILITOT 1.5*   Lipids No results for input(s): CHOL, TRIG, HDL, LABVLDL, LDLCALC, CHOLHDL in the last 168 hours.  Hematology Recent Labs  Lab 12/03/23 0957 12/03/23 1647  WBC 8.1  --   RBC 1.07*  --   HGB 3.6* 6.6*  HCT 10.5* 18.9*  MCV 98.1  --   MCH 33.6  --   MCHC 34.3  --   RDW 20.1*  --   PLT 253  --    Thyroid No results for input(s): TSH, FREET4 in the last 168 hours.  BNPNo results for input(s): BNP, PROBNP in the last 168 hours.  DDimer No results for input(s): DDIMER in the last 168 hours.  Radiology/Studies:  US  Abdomen Limited RUQ (LIVER/GB) Result Date: 12/03/2023 CLINICAL DATA:  Elevated liver enzymes. EXAM: ULTRASOUND ABDOMEN LIMITED RIGHT UPPER QUADRANT COMPARISON:  Abdomen and pelvis CT dated 01/24/2023. FINDINGS: Gallbladder: Poorly distended. No gallstones or wall thickening visualized. No sonographic Murphy sign noted by sonographer. Common bile duct: Diameter: 2.3 mm Liver: Diffusely echogenic. Corresponding low density on the recent CT. The recently demonstrated small liver hemangioma was not visible today with some limitation due to body habitus and deep breathing. Portal vein is patent on color Doppler imaging with normal direction of blood flow towards the liver. Other: None. IMPRESSION: Diffuse hepatic steatosis. Electronically Signed   By: Elspeth Bathe M.D.   On: 12/03/2023 16:05   CT Head Wo Contrast Result Date: 12/03/2023 CLINICAL DATA:  Lethargy EXAM: CT HEAD WITHOUT CONTRAST TECHNIQUE: Contiguous axial images were obtained from the base of the skull through the vertex without intravenous contrast. RADIATION DOSE REDUCTION: This exam was performed according to the departmental  dose-optimization program which includes automated exposure control, adjustment of the mA and/or kV according to patient size and/or use of iterative reconstruction technique. COMPARISON:  None Available. FINDINGS: CT HEAD: There is an old infarct in the right occipital lobe with encephalomalacia. There is no hemorrhage. No acute ischemic changes. No mass lesion. The ventricles are normal. Skull/sinuses/orbits: No significant abnormality. IMPRESSION: Old infarct in the right occipital lobe with encephalomalacia. No acute abnormality. Electronically Signed   By: Nancyann Burns M.D.   On: 12/03/2023 11:55   DG Chest Port 1 View Result Date: 12/03/2023 CLINICAL DATA:  Weakness. EXAM: PORTABLE CHEST 1 VIEW COMPARISON:  12/16/2019 FINDINGS: Mild enlargement of the cardiac silhouette with an interval increase in size. Increased prominence of the pulmonary vasculature. Small amount of patchy density at the left lung base. Otherwise, clear lungs. Right jugular porta catheter tip in the inferior  aspect of the superior vena cava. Mild-to-moderate dextroconvex scoliosis. IMPRESSION: 1. Interval mild cardiomegaly and pulmonary vascular congestion. 2. Small amount of patchy atelectasis or pneumonia at the left lung base. Electronically Signed   By: Elspeth Bathe M.D.   On: 12/03/2023 11:14     Assessment and Plan:  Persistent Atrial Fibrillation  - Patient has persistent atrial fibrillation, has likely been in afib since 09/2022. Has refused DCCV and antiarrhythmic medications in the past. Has been on metoprolol  and diltiazem   - Now admitted with symptomatic anemia- hemoglobin 3.6 - Telemetry shows atrial fibrillation with HR in the 90s-110s. Patient without palpitations.  - Suspect that patient's tachycardia is driven by severe anemia - If BP stabilizes, can start low dose metoprolol   - Hold eliquis    Elevated troponin  - High-sensitivity troponin 520, 498 - Patient reports only mild chest discomfort.  Does have  some shortness of breath. - Suspect this is demand ischemia in the setting of severe anemia. - Echocardiogram pending - No plans for anticoagulation or ischemic evaluation at this time. Can reassess after blood transfusions and AKI resolves   CAD  - Previously had PCI to the RCA in 2020 and PCI to the mid LAD in 2021 - OK to hold home zetia    Otherwise per primary  - Symptomatic anemia - hemoglobin 3.6. Strongly suspect that this is the cause of patient's tachycardia, troponin elevation, and shortness of breath - Rectal Cancer - Transaminitis - AKI   Risk Assessment/Risk Scores:   CHA2DS2-VASc Score = 7   This indicates a 11.2% annual risk of stroke. The patient's score is based upon: CHF History: 1 HTN History: 1 Diabetes History: 1 Stroke History: 2 Vascular Disease History: 1 Age Score: 1 Gender Score: 0     For questions or updates, please contact [facility] Please consult [email or web address] for contact info under    Signed, Rollo FABIENE Louder, PA-C  12/03/2023 2:59 PM  ADDENDUM:   Patient seen and examined with Rollo Louder, PA-C.  I personally taken a history, examined the patient, reviewed relevant notes,  laboratory data / imaging studies.  I performed a substantive portion of this encounter and formulated the important aspects of the plan.  I agree with the APP's note, impression, and recommendations; however, I have edited the note to reflect changes or salient points.   Very pleasant 68 year old Caucasian gentleman who presents to the hospital with chief complaint of tired/fatigue. Patient is accompanied by his daughter and son-in-law at bedside. He denies anginal chest pain, heart failure symptoms, near-syncope or syncopal events. Noted to be in A-fib with RVR and abnormal EKG with ST-T changes with a history of CAD with prior coronary intervention. On arrival he was noted to have a hemoglobin of 3.6 g/dL Patient does not endorse evidence of  bleeding He is on LONSURF  given his history of rectal cancer, followed by medical oncology  PHYSICAL EXAM: Today's Vitals   12/03/23 1615 12/03/23 1700 12/03/23 1715 12/03/23 1911  BP: 128/78 111/77 114/69 119/81  Pulse: (!) 126 (!) 104 100 (!) 101  Resp: 18 17 16 13   Temp:  98.4 F (36.9 C)  98.5 F (36.9 C)  TempSrc:  Oral  Oral  SpO2: 100% 100% 100% 97%  Weight:      Height:      PainSc:    0-No pain   Body mass index is 30.85 kg/m.   Net IO Since Admission: -250 mL [12/03/23 2028]  Filed Weights   12/03/23  0946  Weight: 97.5 kg    Physical Exam  Constitutional: No distress.  hemodynamically stable  Neck: No JVD present.  Cardiovascular: S1 normal and S2 normal. An irregularly irregular rhythm present. Tachycardia present. Exam reveals no gallop, no S3 and no S4.  No murmur heard. Pulmonary/Chest: Effort normal and breath sounds normal. No stridor. He has no wheezes. He has no rales.  Musculoskeletal:        General: No edema.     Cervical back: Neck supple.  Skin: Skin is warm.    EKG: (personally reviewed by me) 12/03/2023: Atrial fibrillation, 119 bpm, diffuse ST depressions.  Telemetry: (personally reviewed by me) A-fib with RVR   Impression & Recommendations: :  A-fib with RVR Coronary artery disease status post coronary interventions without angina pectoris History of CVA. Hypertension. Hyperlipidemia. Diabetes mellitus type 2.  Patient presents with severe symptomatic anemia with hemoglobin of 3.6 g/dL on arrival with severe lactic acidosis at 6.0 and hypoperfusion suggested by transaminitis, acute kidney injury, elevated lactic acid, A-fib with RVR, and ST depressions on EKG.  From a cardiology standpoint his A-fib with RVR is secondary to severe symptomatic anemia and ventricular rates should improve as hemoglobin trends up.  Patient is getting 3 units of PRBCs and his hemoglobin has responded appropriately. Monitor for volume retention/congestion,  may need as needed Lasix .  From a cardiology standpoint would recommend holding Eliquis  given the profound anemia until the underlying cause is known and addressed.  He will need to be cleared by gastroenterology and/or medical hematology/oncology prior to restarting DOAC.  Of note, he denies melanotic stools, hematochezia, hemoptysis, epistaxis, etc.  He is on LONSURF  for his rectal cancer which may be contributory.  By the time I saw the patient he was evaluated by gastroenterology and their shared decision to start IV heparin  for thromboembolic prophylaxis as the patient had thromboembolic event in the past with interruption of anticoagulation.  Please refer to GI note for further reasoning/documentation.  From cardiovascular standpoint no additional diagnostic testing warranted at this time.  Restart home AV nodal blocking agents in a stepwise fashion closer to discharge.  Anticipate he may have soft blood pressures given the severe anemia, A-fib with RVR, and undergoing bowel prep for upcoming colonoscopy.  He will need close monitoring of vital signs and labs.  Plan of care discussed with the patient and daughter at bedside.  Their questions and concerns were addressed to their satisfaction.  Medical Decision:  Complexity: High Independently reviewed: EKGs, prior heart catheterization reports/echocardiogram, gastroenterology consult note, last medical oncology progress note, labs, vital signs, telemetry Prescription drug management -recommendations stated above Family updated: Daughter  Further recommendations to follow as the case evolves.   This note was created using a voice recognition software as a result there may be grammatical errors inadvertently enclosed that do not reflect the nature of this encounter. Every attempt is made to correct such errors.   Madonna Michele HAS, [facility]  A Division of [facility] [address], [place], [place]  [address]  [place], [place] [address] Pager: [phone] Office: [phone] 12/03/2023

## 2023-12-03 NOTE — Assessment & Plan Note (Addendum)
-   admit to FMTS, Pray, indefinite telemetry - GI consulted for concern for acute GI bleed in the setting of known colon cancer - consider CTA abdomen to identify source of bleeding, pending GI consult - FOBT, anemia panel, PT-INR, APTT ordered now - post transfusion PM H/H today - AM CBC, CMP tomorrow - 3 pRBCs ordered  - continuous pulse ox - Protonix  40 mg BID

## 2023-12-03 NOTE — Assessment & Plan Note (Addendum)
 Significant LFT elevation from baseline, unclear etiology. Possibly due to chemotherapy, but will evaluate further.  - RUQ US   - hepatitis panel, HIV - AM CMP

## 2023-12-03 NOTE — ED Provider Notes (Signed)
 [place] EMERGENCY DEPARTMENT AT [facility] Provider Note   CSN: [id number] Arrival date & time: 12/03/23  9064     Patient presents with: Fatigue   John Douglas is a 68 y.o. male.   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  He reports he just has been sluggish and does not have any energy.  He denies any shortness of breath, abdominal pain, nausea or vomiting.  He denies any fever or urinary complaints.  He has not had any chest pain.  He has not noticed any black stools.  Does report several months ago he had a blood transfusion for the first time.  He has not recently started any new medications and has not discontinued any medications.  Denies any falls.  No new swelling.  No new numbness or weakness on one side of his body, difficulty speaking or vision changes.  Daughter also reports over the last few days he has been confused.  The history is provided by the patient and medical records.       Prior to Admission medications   Medication Sig Start Date End Date Taking? Authorizing Provider  diltiazem  (CARDIZEM  CD) 240 MG 24 hr capsule TAKE 1 CAPSULE BY MOUTH EVERY DAY 04/29/23   End, Lonni, MD  ELIQUIS  5 MG TABS tablet TAKE 1 TABLET BY MOUTH TWICE A DAY 10/08/23   Timmy Maude SAUNDERS, MD  ezetimibe  (ZETIA ) 10 MG tablet Take 1 tablet by mouth once daily 06/20/20   End, Lonni, MD  fluticasone  (FLONASE ) 50 MCG/ACT nasal spray Place 1 spray into both nostrils 2 (two) times daily as needed (sinus congestion). 12/14/20   End, Lonni, MD  furosemide  (LASIX ) 40 MG tablet TAKE 1 TABLET BY MOUTH EVERY DAY 07/11/23   End, Lonni, MD  glimepiride  (AMARYL ) 1 MG tablet Take 1 mg by mouth daily. 07/31/20   [provider]  HYDROcodone -acetaminophen  (NORCO) 5-325 MG tablet Take 1 tablet by mouth every 6 (six) hours as needed for moderate pain (pain score 4-6). 03/08/23   Timmy Maude SAUNDERS, MD  metoprolol  succinate (TOPROL -XL) 50 MG 24 hr tablet TAKE 1 TABLET BY MOUTH EVERY DAY 08/13/23   End, Lonni, MD  mupirocin  ointment (BACTROBAN ) 2 % Apply 1 Application topically 3 (three) times daily. Apply to pinky that has the infection. 06/11/23   Timmy Maude SAUNDERS, MD  nitroGLYCERIN  (NITROSTAT ) 0.4 MG SL tablet Place 1 tablet (0.4 mg total) under the tongue every 5 (five) minutes as needed for chest pain. 08/31/22   End, Lonni, MD  potassium chloride  SA (KLOR-CON  M20) 20 MEQ tablet Take 2 tablets (40 mEq total) by mouth daily. 05/30/23   End, Lonni, MD  pramoxine-hydrocortisone  (PROCTOCREAM-HC) 1-1 % rectal cream Place 1 application  rectally 2 (two) times daily as needed for hemorrhoids or anal itching.    [provider]  sulfacetamide  (BLEPH -10) 10 % ophthalmic solution Place 1 drop into both eyes every 4 (four) hours. 04/30/23   Timmy Maude SAUNDERS, MD  trifluridine -tipiracil  (LONSURF ) 15-6.14 MG tablet Take 4 tablets (60 mg of trifluridine  total) by mouth 2 (two) times daily after a meal. Take within 1 hr after AM & PM meals on days 1-5, 8-12. Repeat every 28 days. 09/20/23   Timmy Maude SAUNDERS,  MD    Allergies: Celexa  [citalopram ], Ibuprofen, Metformin and related, Naproxen, Yellow jacket venom [bee venom], Penicillins, and Xgeva  [denosumab ]    Review of Systems  Updated Vital Signs BP 94/70   Pulse (!) 131   Temp 98 F (36.7 C) (Oral)   Resp 19   Ht 5' 10 (1.778 m)   Wt 97.5 kg   SpO2 100%   BMI 30.85 kg/m   Physical Exam Vitals and nursing note reviewed.  Constitutional:      General: He is not in acute distress.    Appearance: He is well-developed. He is ill-appearing.  HENT:     Head: Normocephalic and atraumatic.     Mouth/Throat:     Mouth: Mucous membranes are dry.      Comments: Pale mucous membranes Eyes:     Conjunctiva/sclera: Conjunctivae normal.     Pupils: Pupils are equal, round, and reactive to light.  Cardiovascular:     Rate and Rhythm: Tachycardia present. Rhythm irregularly irregular.     Heart sounds: No murmur heard. Pulmonary:     Effort: Pulmonary effort is normal. No respiratory distress.     Breath sounds: Normal breath sounds. No wheezing or rales.  Abdominal:     General: There is no distension.     Palpations: Abdomen is soft.     Tenderness: There is no abdominal tenderness. There is no guarding or rebound.  Musculoskeletal:        General: No tenderness. Normal range of motion.     Cervical back: Normal range of motion and neck supple.     Right lower leg: No edema.     Left lower leg: No edema.  Skin:    General: Skin is warm and dry.     Coloration: Skin is pale.     Findings: No erythema or rash.  Neurological:     Mental Status: He is alert and oriented to person, place, and time. Mental status is at baseline.  Psychiatric:        Mood and Affect: Mood normal.        Behavior: Behavior normal.     (all labs ordered are listed, but only abnormal results are displayed) Labs Reviewed  CBC WITH DIFFERENTIAL/PLATELET - Abnormal; Notable for the following components:      Result Value   RBC 1.07 (*)    Hemoglobin 3.6 (*)    HCT 10.5 (*)    RDW 20.1 (*)    Lymphs Abs 0.5 (*)    All other components within normal limits  COMPREHENSIVE METABOLIC PANEL WITH GFR - Abnormal; Notable for the following components:   CO2 19 (*)    Glucose, Bld 240 (*)    BUN 55 (*)    Creatinine, Ser 2.08 (*)    Total Protein 6.0 (*)    Albumin 3.3 (*)    AST 164 (*)    ALT 217 (*)    Total Bilirubin 1.5 (*)    GFR, Estimated 34 (*)    Anion gap 16 (*)    All other components within normal limits  LACTIC ACID, PLASMA - Abnormal; Notable for the following components:   Lactic Acid, Venous 6.0 (*)    All other components within  normal limits  CBG MONITORING, ED - Abnormal; Notable for the following components:   Glucose-Capillary 234 (*)    All other components within normal limits  TROPONIN I (HIGH SENSITIVITY) - Abnormal; Notable for the following components:   Troponin I (  High Sensitivity) 520 (*)    All other components within normal limits  URINALYSIS, W/ REFLEX TO CULTURE (INFECTION SUSPECTED)  LACTIC ACID, PLASMA  TYPE AND SCREEN  PREPARE RBC (CROSSMATCH)  TROPONIN I (HIGH SENSITIVITY)    EKG: EKG Interpretation Date/Time:  Tuesday December 03 2023 09:44:44 EDT Ventricular Rate:  119 PR Interval:    QRS Duration:  95 QT Interval:  315 QTC Calculation: 444 R Axis:   21  Text Interpretation: Atrial fibrillation Ventricular premature complex new Repol abnrm, severe global ischemia (LM/MVD) Confirmed by Doretha Folks ([id number]) on 12/03/2023 10:00:33 AM  Radiology: [facility] Chest Port 1 View Result Date: 12/03/2023 CLINICAL DATA:  Weakness. EXAM: PORTABLE CHEST 1 VIEW COMPARISON:  12/16/2019 FINDINGS: Mild enlargement of the cardiac silhouette with an interval increase in size. Increased prominence of the pulmonary vasculature. Small amount of patchy density at the left lung base. Otherwise, clear lungs. Right jugular porta catheter tip in the inferior aspect of the superior vena cava. Mild-to-moderate dextroconvex scoliosis. IMPRESSION: 1. Interval mild cardiomegaly and pulmonary vascular congestion. 2. Small amount of patchy atelectasis or pneumonia at the left lung base. Electronically Signed   By: Elspeth Bathe M.D.   On: 12/03/2023 11:14     Procedures   Medications Ordered in the ED  0.9 %  sodium chloride  infusion (Manually program via Guardrails IV Fluids) (has no administration in time range)  cefTRIAXone  (ROCEPHIN ) 1 g in sodium chloride  0.9 % 100 mL IVPB (has no administration in time range)  azithromycin  (ZITHROMAX ) 500 mg in sodium chloride  0.9 % 250 mL IVPB (has no administration in time range)   lactated ringers  bolus 500 mL (500 mLs Intravenous New Bag/Given 12/03/23 1138)                                    Medical Decision Making Amount and/or Complexity of Data Reviewed Independent Historian: EMS    Details: daughter External Data Reviewed: notes. Labs: ordered. Decision-making details documented in ED Course. Radiology: ordered and independent interpretation performed. Decision-making details documented in ED Course. ECG/medicine tests: ordered and independent interpretation performed. Decision-making details documented in ED Course.  Risk Prescription drug management. Decision regarding hospitalization.   Pt with multiple medical problems and comorbidities and presenting today with a complaint that caries a high risk for morbidity and mortality.  Here today with the above complaints.  Concern for symptomatic anemia, AKI, ACS, A-fib RVR, lower suspicion for infectious etiology.  Patient has no abdominal pain and lowers concern for AAA or dissection.  Patient has been compliant with his Eliquis  and took his last dose this morning.  Blood pressure was 100/67 when looking at past visits blood pressure had been in the 150s.  Patient has taken all of his morning medications.  He denies any shortness of breath and is able to talk in full sentences. Labs and imaging are pending.  I independently interpreted patient's EKG which shows atrial fibrillation with RVR today with some ST dips throughout that may be rate related but were not present prior. 11:54 AM I independently interpreted patient's labs and lactate today is 6, CBC with hemoglobin now down to 3.6 from 10 at the end of June, normal white count and platelet count, CMP with new AKI with creatinine of 2.08 from his baseline of 1, elevated LFTs with AST of 164 ALT of 217 and anion gap at 16, electrolytes within normal limits, troponin elevated  today at 520 most likely from cardiac strain from atrial fibrillation with RVR as well as  symptomatic anemia.  I have independently visualized and interpreted pt's images today.  Chest x-ray with mild pulmonary congestion but also radiology reports a small amount of patchy atelectasis or pneumonia at the left lung base.  Will cover patient with Rocephin  and azithromycin .  Blood cultures were drawn.  Head CT without findings of acute bleed or masses.  3 units of blood were ordered for the patient as well as a small fluid bolus.  He will need admission for further care.  Consulted the hospitalist for admission.  Discussed the findings with the patient and his family and they are comfortable with this plan.  CRITICAL CARE Performed by: Alley Neils Total critical care time: 45 minutes Critical care time was exclusive of separately billable procedures and treating other patients. Critical care was necessary to treat or prevent imminent or life-threatening deterioration. Critical care was time spent personally by me on the following activities: development of treatment plan with patient and/or surrogate as well as nursing, discussions with consultants, evaluation of patient's response to treatment, examination of patient, obtaining history from patient or surrogate, ordering and performing treatments and interventions, ordering and review of laboratory studies, ordering and review of radiographic studies, pulse oximetry and re-evaluation of patient's condition.      Final diagnoses:  Symptomatic anemia  Atrial fibrillation with RVR (HCC)  AKI (acute kidney injury) (HCC)  Elevated troponin  Community acquired pneumonia of left lower lobe of lung    ED Discharge Orders     None          Doretha Folks, MD 12/03/23 1155

## 2023-12-03 NOTE — Assessment & Plan Note (Addendum)
 Diabetes: A1C ordered, sliding scale insulin  ordered

## 2023-12-03 NOTE — Hospital Course (Addendum)
 68 yo male with rectal cancer on Lonsurf  chemotherapy and afib admitted to the [facility] Medicine Teaching service for symptomatic anemia of unclear etiology.   Anemia Patient admitted with Hgb 3.6. S/P 6 pRBCs. Hemoglobin stable around 10. Anemia workup revealed anemia of chronic disease. Low reticulocyte prompting EPO which was appropriately elevated, suspect marrow suppression from chemotherapy. GI consulted and performed EGD and colonoscopy on 12/06/23 that did not demonstrate a bleed. Biopsies taken, surgical pathology pending. Unclear what caused the acute drop in hemoglobin but suspect marrow suppression due to chemotherapy. Patient's anemia was stable and asymptomatic at discharge.   Atrial fibrillation Irregular rhythm and rates 90-130s on admission. Eliquis  held due to suspected GI bleed. Cardiology consulted who suspected initial tachycardia due to anemia. Slowly restarted rate control meds while monitoring BP. Echo on 12/06/23 showed EF reduced 45-50% with mild decreased LV function. Diltiazem  held given worsened EF, LV function. Beta blocker optimization recommended for Rate control. Anticoagulated with heparin  drip during admission based upon GI recs, patient preference, and history of CVA when not anticoagulated. Following scopes, Eliquis  restarted on 12/07/23 per GI recommendation.  AKI Creatinine elevated to 2.08 from baseline ~1. Suspect prerenal in the setting of symptomatic anemia. Cr improving with treatment of anemia, and 1.29 at discharge.   Transaminitis AST/ALT elevated to 164/217 from normal baseline. RUQ US  revealed hepatic steatosis and hepatitis panel negative. Suspect due to ischemia from severe anemia. LFTs trended down during admission and improved to 22/110 at time of discharge.   PCP recommendations: Follow up surgical pathology from EGD, colonoscopy; repeat colonoscopy date TBD by pathology results  Reevaluate Eliquis  dose given initially worsened anemia. Cards does not  favor anticoagulation given severe anemia and recommends left atrial appendage occluder device, follow up with patient.  Once daily PPI recommended by GI following EGD, evaluate duration of therapy Repeat CBC, monitor Hgb closely Follow up with cards regarding watchman, cards messaged Dr. Mady and team coordinator to expedite this

## 2023-12-03 NOTE — Assessment & Plan Note (Addendum)
 Cr baseline ~1. Now 2.08. Suspect prerenal due to low hgb and recent poor po. Received fluid bolus and blood transfusions. No difficulty with urination so do not suspect postrenal obstruction.  - AM CMP

## 2023-12-03 NOTE — H&P (Cosign Needed Addendum)
 Hospital Admission History and Physical Service Pager: [phone]  Patient name: John Douglas Medical record number: [id number] Date of Birth: 09-Jan-1956 Age: 68 y.o. Gender: male  Primary Care Provider: Stephanie Charlene CROME, MD Consultants: none Code Status: Full  Preferred Emergency Contact:  Contact Information     Name Relation Home Work Muir Daughter [phone]  [phone]      Other Contacts     Name Relation Home Work Mobile   Lynch,Carolyn Daughter [phone]  [phone]   [facility] Significant other [phone]  [phone]       Chief Complaint: fatigue  Assessment and Plan: Waylen Depaolo is a 68 y.o. male with PMH of afib, CVA, colon cancer presenting with several days of fatigue at home. Hgb 3.6 on admission, down from 10 1.5 months ago. Patient has not had any gross bleeding and is hemodynamically stable. Given his history of rectal cancer, I suspect this is a chronic bleed from a GI source. Differential for presentation of this includes active GI bleed and progression of IDA.   Assessment & Plan Symptomatic anemia - admit to FMTS, Pray, indefinite telemetry - GI consulted for concern for acute GI bleed in the setting of known colon cancer - consider CTA abdomen to identify source of bleeding, pending GI consult - FOBT, anemia panel, PT-INR, APTT ordered now - post transfusion PM H/H today - AM CBC, CMP tomorrow - 3 pRBCs ordered  - continuous pulse ox - Protonix  40 mg BID Atrial fibrillation with RVR (HCC) Rates range from 100-130 in the ED. He is on diltiazem  and metoprolol  at home and did not take those meds today. Hesitant to restart them due to soft BP. Do not want to anticoagulate given suspicion for GI bleed - consulted cardiology for assistance with rate control in the setting of severe, symptomatic anemia - echo ordered  Elevated troponin EKG with concern for new ST depression in the setting of elevated but downtrending  troponin. Currently asymptomatic. - trending troponins - cardiology consulted Infiltrate of lung present on chest x-ray Atelectasis vs. CAP on chest x-ray. He is not complaining of SOB, cough, sick symptoms. On exam, he is well appearing on RA and lungs are CTAB; however, his BP is soft. He is afrebile and does not have a leukocytosis; however, he is immunosuppressed on chemotherapy. He has a lactic acidosis, but I suspect this is from his anemia. The ER covered him with Rocephin  and Azithromycin . I will reevaluate him tomorrow to determine further workup/treatment.  AKI (acute kidney injury) (HCC) Cr baseline ~1. Now 2.08. Suspect prerenal due to low hgb and recent poor po. Received fluid bolus and blood transfusions. No difficulty with urination so do not suspect postrenal obstruction.  - AM CMP Transaminitis Significant LFT elevation from baseline, unclear etiology. Possibly due to chemotherapy, but will evaluate further.  - RUQ US   - hepatitis panel, HIV - AM CMP Chronic health problem Diabetes: A1C ordered, sliding scale insulin  ordered   FEN/GI: regular VTE Prophylaxis: SCD, medication contraindicated due to suspected GI bleed  Disposition: progressive  History of Present Illness:  John Douglas is a 68 y.o. male presenting with 2 days of fatigue.  He has a past medical history of atrial fibrillation and regularly takes diltiazem , metoprolol , and Eliquis , HLD, CVA.  He has colon cancer for which she is receiving oral chemotherapy. He received a blood transfusion at the end of June for IDA suspected due to colon cancer. He reports that he has felt  lightheaded, required assistance with mobility, and been very fatigued for the past couple days.  No sick symptoms.  No shortness of breath.  No bloody stools.  No dark stools.  No chest pain or headaches or abdominal pain.  He has not had any increase in bleeding from any orifice ease or minor scratches.  He did note that he had hemorrhoids a  couple weeks ago after a trip but that was minor and has not persisted. He has 2 daughters at bedside who reports that they have been called multiple times a day for the past several days to assist him.  They feel that he is really sick and downplaying his current condition and has been confused.  Per the patient he has not been nauseous, but the daughter says that he has complained of nausea regularly. Daughter reports he has been very sick over the last few days. Daughter states that he would be talking at baseline and then start staring off into space.  She also reports SOB, difficulty walking due to weakness. Poor PO intake.  Did not take his morning meds today. Last dose of Eliquis  12/02/23  In the ED, HGB was 3.6. Troponin elevated to 520. Lactic acidosis to 6.0. 3 units of blood were ordered, one started. 500 ml LR bolus given. No meds administered.   Review Of Systems: Per HPI  Pertinent Past Medical History: CVA Afib  DM HLD Remainder reviewed in history tab.   Pertinent Past Surgical History: None   Remainder reviewed in history tab.   Pertinent Social History: Lives alone  Pertinent Family History: None   Remainder reviewed in history tab.   Important Outpatient Medications: Diltiazem  240 mg every day Eliquis  5 mg BID Toprol  XL 50 mg every day Lonsurf  60 mg BID  Remainder reviewed in medication history.   Objective: BP 98/61   Pulse (!) 136   Temp 98.3 F (36.8 C) (Oral)   Resp 17   Ht 5' 10 (1.778 m)   Wt 97.5 kg   SpO2 100%   BMI 30.85 kg/m  Exam: General: male lying back in bed, pale, not acutely ill appearing Eyes: unable to track in the periphery but no focal deficits in EOM, PERL Cardiovascular: tachycardic, irregular rhythm, no m/r/g Respiratory: CTAB, no retractions Gastrointestinal: abd soft, nontender, no palpable organomegaly Derm: no sacral wound, LE skin intact Neuro: CN 2,5,7-12 intact Psych: alert, goal oriented Skin: generalized  pallor  Labs:  CBC BMET  Recent Labs  Lab 12/03/23 0957  WBC 8.1  HGB 3.6*  HCT 10.5*  PLT 253   Recent Labs  Lab 12/03/23 0957  NA 136  K 3.6  CL 101  CO2 19*  BUN 55*  CREATININE 2.08*  GLUCOSE 240*  CALCIUM  9.0    Pertinent additional labs lactic acid 5.0, troponin 498 down from 520.   EKG: ST depression in lead II and aVF, appears new, afib at 119 bpm   Imaging Studies Performed:  CT head 12/03/23 Rads Impression: Old infarct in the right occipital lobe with encephalomalacia. No acute abnormality. My impression: no acute hemorrhage   Chest XR 12/03/23 Rads impression: 1. Interval mild cardiomegaly and pulmonary vascular congestion. 2. Small amount of patchy atelectasis or pneumonia at the left lung base. My impression: Bilateral lower lobe haziness but no evidence of pneumothorax.   Alena Morrison, [place], MD 12/03/2023, 12:09 PM PGY-1, [facility] Family Medicine  FPTS Intern pager: [phone], text pages welcome Secure chat group [facility]  Teaching Service   Upper Level Addendum: I have seen and evaluated this patient along with Dr. Alena and reviewed the above note, making necessary revisions as appropriate. I agree with the medical decision making and physical exam as noted above. Elyce Prescott, DO PGY-2 [facility] Family Medicine Residency

## 2023-12-03 NOTE — Plan of Care (Signed)
 FMTS Interim Progress Note  S: Resting comfortably. Denies CP, SOB. Feels mildly fatigued. No BM since admission.   O: BP 119/81 (BP Location: Right Arm)   Pulse (!) 101   Temp 98.5 F (36.9 C) (Oral)   Resp 13   Ht 5' 10 (1.778 m)   Wt 97.5 kg   SpO2 97%   BMI 30.85 kg/m   General: Chronically ill-appearing, no acute distress Cardio: RRR, no murmur on exam Pulm: clear, No increased work of breathing Abdomen: Soft, bowel sounds present, nontender Extremity: No peripheral edema   A/P: Symptomatic Anemia:  Most likely 2/2 to known rectal cancer. GI consulted and planning colonoscopy 8/7.  - 4th unit RBC pending - RN to place order for post transfusion H&H  - will check CBC Q8H after transfusion  - Goal hgb >7 per GI  - on heparin  drip d/t a fib and hx of stroke off anticoagulation, will continue if Hgb remains stable. Discussed discontinuing drip if his hgb does not appropriately respond to transfusions.   Remainder of plan per H&P   Cleotilde Perkins, DO 12/03/2023, 8:24 PM PGY-3, [facility] Family Medicine Service pager [phone]

## 2023-12-03 NOTE — Progress Notes (Signed)
   12/03/23 2046  Assess: MEWS Score  Temp 98.9 F (37.2 C)  BP (!) 93/58  MAP (mmHg) 71  Pulse Rate (!) 111  ECG Heart Rate (!) 111  Resp 19  Level of Consciousness Alert  SpO2 96 %  O2 Device Room Air  Patient Activity (if Appropriate) In bed  Assess: MEWS Score  MEWS Temp 0  MEWS Systolic 1  MEWS Pulse 2  MEWS RR 0  MEWS LOC 0  MEWS Score 3  MEWS Score Color Yellow  Assess: if the MEWS score is Yellow or Red  Were vital signs accurate and taken at a resting state? Yes  Does the patient meet 2 or more of the SIRS criteria? No  MEWS guidelines implemented  Yes, yellow  Treat  MEWS Interventions Considered administering scheduled or prn medications/treatments as ordered  Take Vital Signs  Increase Vital Sign Frequency  Yellow: Q2hr x1, continue Q4hrs until patient remains green for 12hrs  Escalate  MEWS: Escalate Yellow: Discuss with charge nurse and consider notifying provider and/or RRT  Notify: Charge Nurse/RN  Name of Charge Nurse/RN Notified Tanya RN  Assess: SIRS CRITERIA  SIRS Temperature  0  SIRS Respirations  0  SIRS Pulse 1  SIRS WBC 0  SIRS Score Sum  1     Providers aware patient receiving multiple blood transfusions.

## 2023-12-04 ENCOUNTER — Inpatient Hospital Stay (HOSPITAL_COMMUNITY)

## 2023-12-04 ENCOUNTER — Inpatient Hospital Stay

## 2023-12-04 ENCOUNTER — Inpatient Hospital Stay: Admitting: Hematology & Oncology

## 2023-12-04 DIAGNOSIS — Z85048 Personal history of other malignant neoplasm of rectum, rectosigmoid junction, and anus: Secondary | ICD-10-CM

## 2023-12-04 DIAGNOSIS — I4891 Unspecified atrial fibrillation: Secondary | ICD-10-CM

## 2023-12-04 DIAGNOSIS — R7989 Other specified abnormal findings of blood chemistry: Secondary | ICD-10-CM | POA: Diagnosis not present

## 2023-12-04 DIAGNOSIS — R195 Other fecal abnormalities: Secondary | ICD-10-CM

## 2023-12-04 DIAGNOSIS — D649 Anemia, unspecified: Secondary | ICD-10-CM | POA: Diagnosis not present

## 2023-12-04 DIAGNOSIS — I2510 Atherosclerotic heart disease of native coronary artery without angina pectoris: Secondary | ICD-10-CM | POA: Diagnosis not present

## 2023-12-04 DIAGNOSIS — D509 Iron deficiency anemia, unspecified: Secondary | ICD-10-CM | POA: Diagnosis not present

## 2023-12-04 DIAGNOSIS — N179 Acute kidney failure, unspecified: Secondary | ICD-10-CM | POA: Diagnosis not present

## 2023-12-04 LAB — GLUCOSE, CAPILLARY
Glucose-Capillary: 317 mg/dL — ABNORMAL HIGH (ref 70–99)
Glucose-Capillary: 271 mg/dL — ABNORMAL HIGH (ref 70–99)
Glucose-Capillary: 243 mg/dL — ABNORMAL HIGH (ref 70–99)
Glucose-Capillary: 237 mg/dL — ABNORMAL HIGH (ref 70–99)
Glucose-Capillary: 250 mg/dL — ABNORMAL HIGH (ref 70–99)

## 2023-12-04 LAB — CBC
HCT: 21.6 % — ABNORMAL LOW (ref 39.0–52.0)
HCT: 22.7 % — ABNORMAL LOW (ref 39.0–52.0)
Hemoglobin: 7.8 g/dL — ABNORMAL LOW (ref 13.0–17.0)
Hemoglobin: 8 g/dL — ABNORMAL LOW (ref 13.0–17.0)
MCH: 32.4 pg (ref 26.0–34.0)
MCH: 32 pg (ref 26.0–34.0)
MCHC: 36.1 g/dL — ABNORMAL HIGH (ref 30.0–36.0)
MCHC: 35.2 g/dL (ref 30.0–36.0)
MCV: 89.6 fL (ref 80.0–100.0)
MCV: 90.8 fL (ref 80.0–100.0)
Platelets: 179 K/uL (ref 150–400)
Platelets: 187 K/uL (ref 150–400)
RBC: 2.41 MIL/uL — ABNORMAL LOW (ref 4.22–5.81)
RBC: 2.5 MIL/uL — ABNORMAL LOW (ref 4.22–5.81)
RDW: 16.9 % — ABNORMAL HIGH (ref 11.5–15.5)
RDW: 17.3 % — ABNORMAL HIGH (ref 11.5–15.5)
WBC: 6 K/uL (ref 4.0–10.5)
WBC: 5.5 K/uL (ref 4.0–10.5)
nRBC: 0.5 % — ABNORMAL HIGH (ref 0.0–0.2)
nRBC: 0.9 % — ABNORMAL HIGH (ref 0.0–0.2)

## 2023-12-04 LAB — COMPREHENSIVE METABOLIC PANEL WITH GFR
ALT: 266 U/L — ABNORMAL HIGH (ref 0–44)
AST: 114 U/L — ABNORMAL HIGH (ref 15–41)
Albumin: 3.2 g/dL — ABNORMAL LOW (ref 3.5–5.0)
Alkaline Phosphatase: 52 U/L (ref 38–126)
Anion gap: 14 (ref 5–15)
BUN: 40 mg/dL — ABNORMAL HIGH (ref 8–23)
CO2: 20 mmol/L — ABNORMAL LOW (ref 22–32)
Calcium: 8.7 mg/dL — ABNORMAL LOW (ref 8.9–10.3)
Chloride: 100 mmol/L (ref 98–111)
Creatinine, Ser: 1.54 mg/dL — ABNORMAL HIGH (ref 0.61–1.24)
GFR, Estimated: 49 mL/min — ABNORMAL LOW (ref >60)
Glucose, Bld: 339 mg/dL — ABNORMAL HIGH (ref 70–99)
Potassium: 3.1 mmol/L — ABNORMAL LOW (ref 3.5–5.1)
Sodium: 134 mmol/L — ABNORMAL LOW (ref 135–145)
Total Bilirubin: 2.1 mg/dL — ABNORMAL HIGH (ref 0.0–1.2)
Total Protein: 5.7 g/dL — ABNORMAL LOW (ref 6.5–8.1)

## 2023-12-04 LAB — RETICULOCYTES
Immature Retic Fract: 6.6 % (ref 2.3–15.9)
RBC.: 2.43 MIL/uL — ABNORMAL LOW (ref 4.22–5.81)
Retic Count, Absolute: 41.6 K/uL (ref 19.0–186.0)
Retic Ct Pct: 1.7 % (ref 0.4–3.1)

## 2023-12-04 LAB — ECHOCARDIOGRAM COMPLETE
AR max vel: 2.62 cm2
AV Area VTI: 2.5 cm2
AV Area mean vel: 2.63 cm2
AV Mean grad: 5 mmHg
AV Peak grad: 9.9 mmHg
Ao pk vel: 1.57 m/s
Area-P 1/2: 4.96 cm2
Height: 70 in
S' Lateral: 3.7 cm
Weight: 3440 [oz_av]

## 2023-12-04 LAB — LACTATE DEHYDROGENASE: LDH: 214 U/L — ABNORMAL HIGH (ref 98–192)

## 2023-12-04 LAB — PREPARE RBC (CROSSMATCH)

## 2023-12-04 LAB — HEPARIN LEVEL (UNFRACTIONATED): Heparin Unfractionated: >1.1 [IU]/mL — ABNORMAL HIGH (ref 0.30–0.70)

## 2023-12-04 LAB — MAGNESIUM: Magnesium: 2.2 mg/dL (ref 1.7–2.4)

## 2023-12-04 LAB — APTT
aPTT: 54 s — ABNORMAL HIGH (ref 24–36)
aPTT: 66 s — ABNORMAL HIGH (ref 24–36)

## 2023-12-04 LAB — FOLATE: Folate: 7 ng/mL (ref >5.9)

## 2023-12-04 MED ORDER — SIMETHICONE 80 MG PO CHEW
240.0000 mg | CHEWABLE_TABLET | Freq: Once | ORAL | Status: AC
  Administered 2023-12-04: 240 mg via ORAL
  Filled 2023-12-04: qty 3

## 2023-12-04 MED ORDER — METOPROLOL TARTRATE 25 MG PO TABS
25.0000 mg | ORAL_TABLET | Freq: Two times a day (BID) | ORAL | Status: DC
  Administered 2023-12-04 – 2023-12-06 (×5): 25 mg via ORAL
  Filled 2023-12-04 (×5): qty 1

## 2023-12-04 MED ORDER — ORAL CARE MOUTH RINSE
15.0000 mL | OROMUCOSAL | Status: DC | PRN
Start: 2023-12-04 — End: 2023-12-07

## 2023-12-04 MED ORDER — POTASSIUM CHLORIDE CRYS ER 20 MEQ PO TBCR
40.0000 meq | EXTENDED_RELEASE_TABLET | Freq: Four times a day (QID) | ORAL | Status: DC
Start: 2023-12-04 — End: 2023-12-04
  Filled 2023-12-04: qty 2

## 2023-12-04 MED ORDER — SODIUM CHLORIDE 0.9 % IV SOLN: INTRAVENOUS | Status: AC

## 2023-12-04 MED ORDER — SODIUM CHLORIDE 0.9% IV SOLUTION: Freq: Once | INTRAVENOUS | Status: DC

## 2023-12-04 MED ORDER — SIMETHICONE 80 MG PO CHEW
240.0000 mg | CHEWABLE_TABLET | Freq: Once | ORAL | Status: DC
  Filled 2023-12-04: qty 3

## 2023-12-04 MED ORDER — SODIUM CHLORIDE (PF) 0.9 % IJ SOLN
INTRAMUSCULAR | Status: AC
  Administered 2023-12-04: 10 mL
  Filled 2023-12-04: qty 10

## 2023-12-04 MED ORDER — NA SULFATE-K SULFATE-MG SULF 17.5-3.13-1.6 GM/177ML PO SOLN
0.5000 | Freq: Once | ORAL | Status: AC
  Administered 2023-12-04: 177 mL via ORAL
  Filled 2023-12-04: qty 1

## 2023-12-04 MED ORDER — POTASSIUM CHLORIDE 10 MEQ/100ML IV SOLN
10.0000 meq | INTRAVENOUS | Status: AC
  Administered 2023-12-04 (×4): 10 meq via INTRAVENOUS
  Filled 2023-12-04 (×4): qty 100

## 2023-12-04 MED ORDER — NA SULFATE-K SULFATE-MG SULF 17.5-3.13-1.6 GM/177ML PO SOLN: 0.5000 | Freq: Once | ORAL | Status: DC

## 2023-12-04 NOTE — Assessment & Plan Note (Signed)
 Asymptomatic. Initially elevated troponin. Suspect demand ischemia due to severe anemia, troponins downtrended. No longer need to follow trops. - cardiology consulted

## 2023-12-04 NOTE — Progress Notes (Addendum)
 ANTICOAGULATION CONSULT NOTE  Pharmacy Consult for Heparin  Indication: atrial fibrillation  Patient Measurements: Height: 5' 10 (177.8 cm) Weight: 97.5 kg (215 lb) IBW/kg (Calculated) : 73 Heparin  Dosing Weight: 93.1 kg  Vital Signs: Temp: 98.8 F (37.1 C) (08/06 1154) Temp Source: Oral (08/06 1154) BP: 137/79 (08/06 1154) Pulse Rate: 128 (08/06 1154)  Labs: Recent Labs    12/03/23 0957 12/03/23 1157 12/03/23 1647 12/04/23 0149 12/04/23 0412 12/04/23 0921 12/04/23 1220  HGB 3.6*  --  6.6* 7.8*  --  8.0*  --   HCT 10.5*  --  18.9* 21.6*  --  22.7*  --   PLT 253  --   --  179  --  187  --   APTT  --   --  43* 54*  --   --  66*  LABPROT  --   --  29.0*  --   --   --   --   INR  --   --  2.6*  --   --   --   --   HEPARINUNFRC  --   --   --  >1.10*  --   --   --   CREATININE 2.08*  --   --   --  1.54*  --   --   TROPONINIHS 520* 498*  --   --   --   --   --     Estimated Creatinine Clearance: 53 mL/min (A) (by C-G formula based on SCr of 1.54 mg/dL (H)).  Assessment: 32 yom with a history of AF, CVA, colon cancer, CAD, HTN, HLD, ischemic cardiomyopathy, T2DM. Patient is presenting with AF. Heparin  per pharmacy consult placed for atrial fibrillation.  Patient being seen by GI for concern for GIB with low hemoglobin requiring blood transfusion. Patient is on apixaban  prior to arrival. Last dose 8/4 1800. Will require aPTT monitoring due to likely falsely high anti-Xa level secondary to DOAC use.  Discussed with Dr. Stoney, GI requesting bridge with heparin  and hold apixaban . GI requests no missed anticoagulation citing high stroke risk and patient is worried. They are requesting additional transfusion and begin heparin . After discussing with Dr. Stoney will opt for lower goal levels until bleeding risk is lower.  aPTT 66, therapeutic at lower end of range, which is goal. Hgb improved to 7.8 s/p 4 units RBC. PLT WNL. No infusion issues or signs of bleeding, per  RN.  Will continue to target lower end of goal. EGD and colonoscopy planned for tomorrow, per GI note. Will hold heparin  at 0530 tomorrow for colonoscopy at 0930.  Goal of Therapy:  Heparin  level 0.3-0.5 units/ml aPTT 66-85seconds Monitor platelets by anticoagulation protocol: Yes   Plan:  Continue heparin  infusion at 1200 units/hr  Check aPTT in 6 hours and daily while on heparin   Continue to monitor H&H and platelets   Thank you for allowing pharmacy to be a part of this patient's care    Prentice DOROTHA Favors, PharmD PGY1 Health-System Pharmacy Administration and Leadership Resident [facility]  12/04/2023 2:05 PM

## 2023-12-04 NOTE — Assessment & Plan Note (Signed)
 Cr baseline ~1. 2.08 on admission, now improving at 1.54, s/p 4 pRBC and 1 bolus LR. Response to volume resuscitation supports suspicion for prerenal AKI due to volume depletion.  - Continue to support volume status as indicated by clinical picture and Hgb - AM CMP

## 2023-12-04 NOTE — Plan of Care (Signed)
  Problem: Coping: Goal: Ability to adjust to condition or change in health will improve Outcome: Progressing   Problem: Fluid Volume: Goal: Ability to maintain a balanced intake and output will improve Outcome: Progressing   Problem: Health Behavior/Discharge Planning: Goal: Ability to manage health-related needs will improve Outcome: Progressing   Problem: Skin Integrity: Goal: Risk for impaired skin integrity will decrease Outcome: Progressing   Problem: Nutrition: Goal: Adequate nutrition will be maintained Outcome: Progressing   Problem: Pain Managment: Goal: General experience of comfort will improve and/or be controlled Outcome: Progressing

## 2023-12-04 NOTE — Plan of Care (Addendum)
 FMTS Interim Progress Note  S: Night rounding. Patient completing bowel prep for colonoscopy tomorrow. Daughter at bedside. Denies pain. Denies bloody or black stool.   O: BP 92/73 (BP Location: Right Arm)   Pulse 95   Temp 98.3 F (36.8 C) (Oral)   Resp 19   Ht 5' 10 (1.778 m)   Wt 97.5 kg   SpO2 92%   BMI 30.85 kg/m   General: Chronically ill-appearing, no acute distress Cardio: RRR, no murmur on exam Pulm: clear, No increased work of breathing   A/P: Symptomatic Anemia:  Hgb 5.6 on repeat today. Platelets dropped from 187 > 111. No overt bleeding per pt. GI suspects drop in Hgb could be 2/2 to better than expected response to 4 unit transfusion from presenting hgb 3.6 - DC heparin  drip  - transfuse 2 units RBC  - RN to place H&H post transfusion  - check Hgb q6h s/p 2 units  - goal hgb >7 prior to GI procedure  - plan for EGD and colonoscopy tomorrow   Atrial Fibrillation:  Restarted on home Lopressor  25 mg BID. HR improving.  - monitor BP overnight    Cleotilde Perkins, DO 12/04/2023, 7:45 PM PGY-3, [facility] Family Medicine Service pager [phone]

## 2023-12-04 NOTE — Assessment & Plan Note (Deleted)
***  asymptomatic. *** Suspect demand ischemia due to severe anemia, troponins downtrended.  - cardiology consulted

## 2023-12-04 NOTE — Assessment & Plan Note (Addendum)
 Diabetes: awaiting A1C, BG 248-339, sensitive sliding scale insulin  ordered, currently clear liquid diet so will not start basal insulin  HLD: holding zetia  until LFT improve.

## 2023-12-04 NOTE — Progress Notes (Addendum)
 Daily Progress Note  DOA: 12/03/2023 Hospital Day: 2   ASSESSMENT    68 year old male with history of metastatic rectal cancer treated with chemotherapy and radiation (patient opted against surgical resection).  Admitted with severe anemia Had good response to treatment. 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   Rectal biopsies at the area were negative for recurrent cancer.     Profound normocytic anemia.  Hemoglobin 3.6, down from around 10 in June .  Not overtly bleeding.  Unclear cause of the severe decline in hemoglobin. Not likely chemotherapy related as WBC and platelets are normal ( also discussed with her Oncologist ( Dr. Timmy) who doesn't feel it is related to the [facility]. Could be bleeding from an upper GI source or possibly from the right colon as we have never been able to get a good look at colon due to poor prep on initial exam and then inability to traverse the rectal stricture on follow-up exam in 2023.  TODAY>> hemoglobin improved to 8 following transfusion.    Mildly elevated LFTs AKI Elevated troponin Suspect all these abnormalities were due to severe anemia.   TODAY>> Renal function improving.  LFTs not yet improving. Hep panel negative. RUQ shows fatty liver, nothing acute.  No gallstones and CBD only 2.3 mm  Atrial fibrillation Eliquis  on hold, getting IV heparin .    History of CVA (while off of Eliquis )    PLAN   --EGD and colonoscopy tomorrow as already planned --Once the timing of procedures has been determined I will hold IV heparin  4 hours prior -- Trend LFTs.  Intermittently through the years patient's total bilirubin has been elevated.  Will fractionate the bilirubin  Subjective   Feels okay.  Getting ready to start GoLytely  prep.    Objective   GI Studies:    Recent Labs    12/03/23 0957 12/03/23 1647 12/04/23 0149 12/04/23 0921  WBC 8.1   --  6.0 5.5  HGB 3.6* 6.6* 7.8* 8.0*  HCT 10.5* 18.9* 21.6* 22.7*  MCV 98.1  --  89.6 90.8  PLT 253  --  179 187   Recent Labs    12/03/23 1647 12/04/23 0149  FOLATE  --  7.0  VITAMINB12 661  --   FERRITIN 2,157*  --   TIBC 255  --   IRONPCTSAT 48*  --    Recent Labs    12/03/23 0957 12/04/23 0412  NA 136 134*  K 3.6 3.1*  CL 101 100  CO2 19* 20*  GLUCOSE 240* 339*  BUN 55* 40*  CREATININE 2.08* 1.54*  CALCIUM  9.0 8.7*   Recent Labs    12/03/23 0957 12/04/23 0412  PROT 6.0* 5.7*  ALBUMIN 3.3* 3.2*  AST 164* 114*  ALT 217* 266*  ALKPHOS 47 52  BILITOT 1.5* 2.1*      Imaging:  US  Abdomen Limited RUQ (LIVER/GB) CLINICAL DATA:  Elevated liver enzymes.  EXAM: ULTRASOUND ABDOMEN LIMITED RIGHT UPPER QUADRANT  COMPARISON:  Abdomen and pelvis CT dated 01/24/2023.  FINDINGS: Gallbladder:  Poorly distended. No gallstones or wall thickening visualized. No sonographic Murphy sign noted by sonographer.  Common bile duct:  Diameter: 2.3 mm  Liver:  Diffusely echogenic. Corresponding low density on the recent CT. The recently demonstrated small liver hemangioma was not visible today with some limitation due to body habitus and deep breathing. Portal vein is patent on color Doppler imaging with normal  direction of blood flow towards the liver.  Other: None.  IMPRESSION: Diffuse hepatic steatosis.  Electronically Signed   By: Elspeth Bathe M.D.   On: 12/03/2023 16:05 CT Head Wo Contrast CLINICAL DATA:  Lethargy  EXAM: CT HEAD WITHOUT CONTRAST  TECHNIQUE: Contiguous axial images were obtained from the base of the skull through the vertex without intravenous contrast.  RADIATION DOSE REDUCTION: This exam was performed according to the departmental dose-optimization program which includes automated exposure control, adjustment of the mA and/or kV according to patient size and/or use of iterative reconstruction technique.  COMPARISON:  None  Available.  FINDINGS: CT HEAD:  There is an old infarct in the right occipital lobe with encephalomalacia.  There is no hemorrhage.  No acute ischemic changes.  No mass lesion.  The ventricles are normal.  Skull/sinuses/orbits:  No significant abnormality.  IMPRESSION: Old infarct in the right occipital lobe with encephalomalacia.  No acute abnormality.  Electronically Signed   By: Nancyann Burns M.D.   On: 12/03/2023 11:55 DG Chest Port 1 View CLINICAL DATA:  Weakness.  EXAM: PORTABLE CHEST 1 VIEW  COMPARISON:  12/16/2019  FINDINGS: Mild enlargement of the cardiac silhouette with an interval increase in size. Increased prominence of the pulmonary vasculature. Small amount of patchy density at the left lung base. Otherwise, clear lungs. Right jugular porta catheter tip in the inferior aspect of the superior vena cava. Mild-to-moderate dextroconvex scoliosis.  IMPRESSION: 1. Interval mild cardiomegaly and pulmonary vascular congestion. 2. Small amount of patchy atelectasis or pneumonia at the left lung base.  Electronically Signed   By: Elspeth Bathe M.D.   On: 12/03/2023 11:14     Scheduled inpatient medications:   sodium chloride    Intravenous Once   Chlorhexidine  Gluconate Cloth  6 each Topical Daily   insulin  aspart  0-9 Units Subcutaneous TID WC   metoprolol  tartrate  25 mg Oral BID   pantoprazole  (PROTONIX ) IV  40 mg Intravenous Q12H   sodium chloride  flush  10-40 mL Intracatheter Q12H   Continuous inpatient infusions:   heparin  1,200 Units/hr (12/04/23 0314)   potassium chloride  10 mEq (12/04/23 1220)   PRN inpatient medications: mouth rinse, sodium chloride  flush  Vital signs in last 24 hours: Temp:  [97.7 F (36.5 C)-98.9 F (37.2 C)] 98.8 F (37.1 C) (08/06 1154) Pulse Rate:  [93-128] 128 (08/06 1154) Resp:  [13-23] 19 (08/06 1154) BP: (93-137)/(58-86) 137/79 (08/06 1154) SpO2:  [71 %-100 %] 93 % (08/06 0722) Last BM Date :  12/02/23  Intake/Output Summary (Last 24 hours) at 12/04/2023 1308 Last data filed at 12/04/2023 1222 Gross per 24 hour  Intake 1106.7 ml  Output 1150 ml  Net -43.3 ml    Intake/Output from previous day: 08/05 0701 - 08/06 0700 In: 1056.7 [P.O.:960; I.V.:96.7] Out: 1150 [Urine:1150] Intake/Output this shift: Total I/O In: 50 [I.V.:50] Out: -    Physical Exam:  General: Alert male in NAD Heart:  Regular rate and rhythm.  Pulmonary: Normal respiratory effort Abdomen: Soft, nondistended, nontender. Normal bowel sounds. Neurologic: Alert and oriented Psych: Pleasant. Cooperative     LOS: 1 day   Vina Dasen ,NP 12/04/2023, 1:08 PM

## 2023-12-04 NOTE — Consult Note (Signed)
 John Douglas is well-known to me.  He is a very nice 68 year old white male.  He has metastatic adenocarcinoma of the rectum.  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  He has been treated with systemic chemotherapy.  He has also had chemotherapy and radiation therapy to the rectal area.  Currently, he has been taking Lonsurf .  I think he started this in April 2025.  He was last seen in the office on 10/24/2023.  He has been doing well.  His CEA has been normal.  He really had had no symptoms.  He was admitted on 12/03/2023 with severe fatigue and weakness.  He was profoundly anemic.  His CBC showed a white cell count of 8.1.  Hemoglobin 3.6.  Hematocrit 10.5.  Platelet count 253,000.  He was transfused.  This morning, his hemoglobin is 7.8.  White cell count 6.  Platelet count 179,000.  His reticulocyte count is quite low at 1.7.  His electrolytes when he came in showed a BUN of 55 creatinine 2.08.  His SGPT was 217 SGOT 164.  Bilirubin 1.5.  His blood sugar was 240.  His blood sugars have been on the high side.  His troponin I have also been incredibly high.  Prior had iron studies that were done which were actually okay.  This morning, his BUN was 40 creatinine 1.54.  Blood sugar 339.  His SGPT to 66 SGOT 114.  An ultrasound of the abdomen that was done on 12/03/2023.  This showed hepatic steatosis.  His last CEA when we saw him in June was normal at 1.32.  He denies any obvious melena or bright red blood per rectum. He has had no hematemesis or hemoptysis.  There is been no fever.  He has been eating okay.  He has had no cough.  He has had no rashes.  He has had no leg swelling.  He has been on anticoagulation because of the atrial fibrillation.   His vital signs show temperature of 98.2.  Pulse 110.  Blood pressure 123/79.  Head and neck exam shows no ocular or oral lesions.  He has no scleral icterus.  He has no adenopathy in the neck.  Lungs are clear  bilaterally.  Cardiac exam is tachycardic and irregular consistent with atrial fibrillation.  Abdomen is soft.  He is nontender.  He has no guarding or rebound tenderness.  He has no fluid wave.  There is no palpable liver or spleen tip.  Extremities shows some trace edema in the lower extremities.  Skin exam shows no rashes, ecchymosis or petechia.  Neurological exam is nonfocal.   John Douglas is a 68 year old white male.  He has metastatic adenocarcinoma of the rectum.  He is on Lonsurf .  He comes in with marked anemia.  I suppose this might be in part to the Lonsurf .  However, his white cells and platelets were adequate so I would think that if Lonsurf  was the etiology that he would have pancytopenia.  I would like to believe that he is bleeding.  There is been no stool studies done on him as of yet.  I know that when we had checked him in the office before, he had positive stools.  However, he did not want to undergo endoscopy.  I really think that endoscopic evaluation is needed at this point.  He is in the hospital.  We really need to make sure that there is no source of bleeding.  I would  not think that he would have recurrent malignancy given that he has a normal CEA.  He does have diabetes has not well-controlled.  He has renal insufficiency.  He has hepatic steatosis.  His reticulocyte count is quite low so he may have a very low erythropoietin  level.  I will check this.  We will follow along.  I do appreciate everybody's help.  I know that he will get incredible care from all the staff in the [facility].    Jeralyn Crease, MD  John Douglas 3:17

## 2023-12-04 NOTE — Assessment & Plan Note (Signed)
 Iron levels appropriate at this time, transferring/ferritin more indicative of an anemia with chronic disease component. Continue to suspect he has a GI bleed. GI has an EGD and colonoscopy planned for tomorrow to further evaluate a source of bleeding.   - GI following - EGD and colonoscopy planned for tomorrow - oncology following, do not suspect this is entirely from Lonsurf  chemo - eval EPO level - cardiology following, suspect tachycardia due to anemia - cards started Lopressor  25 mg BID, monitor BP then restart Toprolol if stable - CBC q8h  - f/u haptoglobin - AM CMP - added on Mg to today's labs - Protonix  40 mg BID

## 2023-12-04 NOTE — Assessment & Plan Note (Addendum)
 Persistent elevation of transaminase. RUQ US  revealed hepatic steatosis, hepatits panel wnl. Suspect due to ischemia in the setting of severe anemia.  - AM CMP - asking oncology if this is a possible side effect from Lonsurf 

## 2023-12-04 NOTE — Assessment & Plan Note (Addendum)
 Rates below 120 when sitting. Cards suspects tachycardia is due to anemia and I concur. Will not rate control at this time. Continues to have some soft BP.  - consulted cardiology - Lopressor  25 mg BID as above - echo ordered  - heparin  gtt for anticoagulated given history of CVA when not anticoagulated

## 2023-12-04 NOTE — Progress Notes (Signed)
*  PRELIMINARY RESULTS* Echocardiogram 2D Echocardiogram has been performed.  John Douglas Stallion 12/04/2023, 11:47 AM

## 2023-12-04 NOTE — Inpatient Diabetes Management (Signed)
 Inpatient Diabetes Program Recommendations  AACE/ADA: New Consensus Statement on Inpatient Glycemic Control (2025)  Target Ranges:  Prepandial:   less than 140 mg/dL      Peak postprandial:   less than 180 mg/dL (1-2 hours)      Critically ill patients:  140 - 180 mg/dL   Lab Results  Component Value Date   GLUCAP 271 (H) 12/04/2023   HGBA1C 8.0 (H) 10/09/2019    Review of Glycemic Control  Latest Reference Range & Units 12/04/23 06:06 12/04/23 11:46  Glucose-Capillary 70 - 99 mg/dL 682 (H) 728 (H)   Diabetes history: DM Outpatient Diabetes medications:  Amaryl  1 mg daily Current orders for Inpatient glycemic control:  Novolog  0-9 units tid with meals  Inpatient Diabetes Program Recommendations:   Please consider adding Semglee 15 units daily.   Thanks,  Randall Bullocks, RN, BC-ADM Inpatient Diabetes Coordinator Pager [phone]  (8a-5p)

## 2023-12-04 NOTE — Plan of Care (Signed)
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
## 2023-12-04 NOTE — Progress Notes (Addendum)
 Daily Progress Note Intern Pager: [phone]  Patient name: John Douglas Medical record number: [id number] Date of birth: 1955/10/30 Age: 68 y.o. Gender: male  Primary Care Provider: Stephanie Charlene CROME, MD Consultants: GI, cardiology, oncology Code Status: Full   Pt Overview and Major Events to Date:  12/03/23 - admitted  Assessment and Plan:  This is a 68 yo male admitted for symptomatic anemia that is now stable. I suspect this is secondary to GI bleed, and EGD and colonoscopy are ordered to evaluate this. He has afib that is typically anticoagulated with eliquis . PMH of CVA when anticoagulated, so GI recommended starting heparin  gtt for anticoagulation.  Assessment & Plan Symptomatic anemia Iron levels appropriate at this time, transferring/ferritin more indicative of an anemia with chronic disease component. Continue to suspect he has a GI bleed. GI has an EGD and colonoscopy planned for tomorrow to further evaluate a source of bleeding.   - GI following - EGD and colonoscopy planned for tomorrow - oncology following, do not suspect this is entirely from Lonsurf  chemo - eval EPO level - cardiology following, suspect tachycardia due to anemia - cards started Lopressor  25 mg BID, monitor BP then restart Toprolol if stable - CBC q8h  - f/u haptoglobin - AM CMP - added on Mg to today's labs - Protonix  40 mg BID Atrial fibrillation with RVR (HCC) Rates below 120 when sitting. Cards suspects tachycardia is due to anemia and I concur. Will not rate control at this time. Continues to have some soft BP.  - consulted cardiology - Lopressor  25 mg BID as above - echo ordered  - heparin  gtt for anticoagulated given history of CVA when not anticoagulated Infiltrate of lung present on chest x-ray Atelectasis vs. CAP on chest x-ray. He continues without SOB, cough, sick symptoms. On exam, he is well appearing on RA and lungs are CTAB. His BP is improving, but not completely at baseline. He  is afrebile and does not have a leukocytosis; however, he is immunosuppressed on chemotherapy. His lactic acidosis is improving, and I suspect this was more so due to his anemia as opposed to infection. He was covered him 24 hours of Rocephin  and Azithromycin  by the ER for pneumonia. At this time I do not think he needs further abx treatment for PNA. We will hold off and see how he does today.  Atherosclerosis of native coronary artery of native heart without angina pectoris Asymptomatic. Initially elevated troponin. Suspect demand ischemia due to severe anemia, troponins downtrended. No longer need to follow trops. - cardiology consulted AKI (acute kidney injury) (HCC) Cr baseline ~1. 2.08 on admission, now improving at 1.54, s/p 4 pRBC and 1 bolus LR. Response to volume resuscitation supports suspicion for prerenal AKI due to volume depletion.  - Continue to support volume status as indicated by clinical picture and Hgb - AM CMP Transaminitis Persistent elevation of transaminase. RUQ US  revealed hepatic steatosis, hepatits panel wnl. Suspect due to ischemia in the setting of severe anemia.  - AM CMP - asking oncology if this is a possible side effect from Lonsurf  Chronic health problem Diabetes: awaiting A1C, BG 248-339, sensitive sliding scale insulin  ordered, currently clear liquid diet so will not start basal insulin  HLD: holding zetia  until LFT improve.  FEN/GI: CLD PPx: heparin  gtt Dispo:Home pending clinical improvement .   Subjective:  Overall, he is feeling better. No CP, SOB. He is primarily concerned with his lack of sleep overnight due to medical interventions.  Objective:  Temp:  [97.7 F (36.5 C)-98.9 F (37.2 C)] 97.8 F (36.6 C) (08/06 0722) Pulse Rate:  [29-136] 112 (08/06 0722) Resp:  [13-24] 19 (08/06 0722) BP: (88-128)/(58-86) 113/69 (08/06 0722) SpO2:  [71 %-100 %] 93 % (08/06 0722)  Physical Exam: General: lying comfortably in bed, asleep Cardiovascular:  irregular rhythm, tachycardic Respiratory: lungs CTAB, no increased WOB Extremities: 2+ radial pulses  Laboratory: Most recent CBC Lab Results  Component Value Date   WBC 5.5 12/04/2023   HGB 8.0 (L) 12/04/2023   HCT 22.7 (L) 12/04/2023   MCV 90.8 12/04/2023   PLT 187 12/04/2023   Most recent BMP    Latest Ref Rng & Units 12/04/2023    4:12 AM  BMP  Glucose 70 - 99 mg/dL 660   BUN 8 - 23 mg/dL 40   Creatinine 9.38 - 1.24 mg/dL 8.45   Sodium 864 - 854 mmol/L 134   Potassium 3.5 - 5.1 mmol/L 3.1   Chloride 98 - 111 mmol/L 100   CO2 22 - 32 mmol/L 20   Calcium  8.9 - 10.3 mg/dL 8.7    Other pertinent labs: Iron - 121 TIBC 255 Ferritin - elevated at 2157 Transferrin low at 175   Lactic acid downtrending 2.3<5.0  PT 29.0 elevated INR 2.6 elevated APTT 43 elevated  Hepatitis panel, non reactive HIV nonreactive  Imaging/Diagnostic Tests: US  abdomen RUQ 12/03/23 Impression: diffuse hepatic steatosis. No reported metastasis.  Alena Morrison, Elio, MD 12/04/2023, 10:06 AM  PGY-1, [facility] FPTS Intern pager: [phone], text pages welcome Secure chat group [facility] Teaching Service

## 2023-12-04 NOTE — Progress Notes (Signed)
 ANTICOAGULATION CONSULT NOTE  Pharmacy Consult for Heparin  Indication: atrial fibrillation  Patient Measurements: Height: 5' 10 (177.8 cm) Weight: 97.5 kg (215 lb) IBW/kg (Calculated) : 73 Heparin  Dosing Weight: 93.1 kg  Vital Signs: Temp: 97.9 F (36.6 C) (08/06 0000) Temp Source: Oral (08/06 0000) BP: 106/64 (08/06 0000) Pulse Rate: 115 (08/06 0000)  Labs: Recent Labs    12/03/23 0957 12/03/23 1157 12/03/23 1647 12/04/23 0149  HGB 3.6*  --  6.6* 7.8*  HCT 10.5*  --  18.9* 21.6*  PLT 253  --   --  179  APTT  --   --  43* 54*  LABPROT  --   --  29.0*  --   INR  --   --  2.6*  --   HEPARINUNFRC  --   --   --  >1.10*  CREATININE 2.08*  --   --   --   TROPONINIHS 520* 498*  --   --     Estimated Creatinine Clearance: 39.3 mL/min (A) (by C-G formula based on SCr of 2.08 mg/dL (H)).  Assessment: 55 yom with a history of AF, CVA, colon cancer, CAD, HTN, HLD, ischemic cardiomyopathy, T2DM. Patient is presenting with AF. Heparin  per pharmacy consult placed for atrial fibrillation.  Patient being seen by GI for concern for GIB with low hemoglobin requiring blood transfusion. Patient is on apixaban  prior to arrival. Last dose 8/4 1800. Will require aPTT monitoring due to likely falsely high anti-Xa level secondary to DOAC use.  Discussed with Dr. Stoney, GI requesting bridge with heparin  and hold apixaban . GI requests no missed anticoagulation citing high stroke risk and patient is worried. They are requesting additional transfusion and begin heparin . After discussing with Dr. Stoney will opt for lower goal levels until bleeding risk is lower. Hgb 3.3>6.6; plt 253, aPTT 43, PT/INR 29/2.6  AM: heparin  level falsely elevated and aPTT below goal on 1000 units/hr. Per RN, no issues with the heparin  gtt running continuously or signs/symptoms of bleeding. Hgb now 7.8 s/p 4u PRBCs  Goal of Therapy:  Heparin  level 0.3-0.5 units/ml aPTT 66-85seconds Monitor platelets by  anticoagulation protocol: Yes   Plan:  Increase heparin  infusion at 1200 units/hr Check aPTT in 8h Daily levels while on heparin  Continue to monitor via aPTT until levels are correlated Continue to monitor H&H and platelets  Dorn Buttner, PharmD, BCPS 12/04/2023 2:55 AM ED Clinical Pharmacist -  [phone]

## 2023-12-04 NOTE — TOC Initial Note (Signed)
 Transition of Care [facility]) - Initial/Assessment Note    Patient Details  Name: John Douglas MRN: [id number] Date of Birth: Sep 14, 1955  Transition of [facility]) CM/SW Contact:    Lauraine FORBES Saa, LCSWA Phone Number: 12/04/2023, 2:34 PM  Clinical Narrative:                  2:35 PM CSW introduced self and role to patient. Patient's significant other, Pam, was also present at bedside. Patient consented CSW to speak to and in front of Pam. Patient confirmed he resides at home alone and that family could provide transportation upon discharge. Patient denied SNF and HH history. Patient confirmed DME (cane, walker, BSC) history (per chart review, with [employer]). Per chart review, patient has history with [facility]. Patient has a PCP and insurance. Patient's preferred pharmacy's are [facility], [facility], [facility] [address] and [facility] [address]  Expected Discharge Plan: Home/Self Care Barriers to Discharge: Continued Medical Work up   Patient Goals and CMS Choice Patient states their goals for this hospitalization and ongoing recovery are:: to return home          Expected Discharge Plan and Services       Living arrangements for the past 2 months: Single Family Home                                      Prior Living Arrangements/Services Living arrangements for the past 2 months: Single Family Home Lives with:: Self Patient language and need for interpreter reviewed:: Yes Do you feel safe going back to the place where you live?: Yes      Need for Family Participation in Patient Care: No (Comment)   Current home services: DME Criminal Activity/Legal Involvement Pertinent to Current Situation/Hospitalization: No - Comment as needed  Activities of Daily Living   ADL Screening (condition at time of admission) Independently performs ADLs?: Yes (appropriate for developmental age) Is the  patient deaf or have difficulty hearing?: No Does the patient have difficulty seeing, even when wearing glasses/contacts?: No Does the patient have difficulty concentrating, remembering, or making decisions?: No  Permission Sought/Granted Permission sought to share information with : Family Supports Permission granted to share information with : Yes, Verbal Permission Granted  Share Information with NAME: Holley Redman     Permission granted to share info w Relationship: Significant Other  Permission granted to share info w Contact Information: [phone]  Emotional Assessment Appearance:: Appears stated age Attitude/Demeanor/Rapport: Engaged Affect (typically observed): Accepting, Adaptable, Stable, Calm, Appropriate, Pleasant Orientation: : Oriented to Self, Oriented to Place, Oriented to  Time, Oriented to Situation Alcohol / Substance Use: Not Applicable Psych Involvement: No (comment)  Admission diagnosis:  Anemia [D64.9] Elevated troponin [R79.89] AKI (acute kidney injury) (HCC) [N17.9] Atrial fibrillation with RVR (HCC) [I48.91] Symptomatic anemia [D64.9] Community acquired pneumonia of left lower lobe of lung [J18.9] Patient Active Problem List   Diagnosis Date Noted   Heme positive stool 12/04/2023   Symptomatic anemia 12/03/2023   Chronic health problem 12/03/2023   Elevated troponin 12/03/2023   Community acquired pneumonia of left lower lobe of lung 12/03/2023   AKI (acute kidney injury) (HCC) 12/03/2023   Transaminitis 12/03/2023   Infiltrate of lung present on chest x-ray 12/03/2023   Atherosclerosis of native coronary artery of native heart without angina pectoris 12/03/2023   Abdominal  pain 01/11/2023   Low back pain with sciatica 01/11/2023   Hyperlipidemia associated with type 2 diabetes mellitus (HCC) 08/17/2020   Chronic heart failure with preserved ejection fraction (HFpEF) (HCC) 05/19/2020   Persistent atrial fibrillation (HCC) 02/25/2020   Acute  diverticulitis 12/18/2019   Bacteremia due to Escherichia coli 12/17/2019   Fever in adult 12/16/2019   Leukopenia due to antineoplastic chemotherapy (HCC) 12/16/2019   Iron deficiency anemia due to chronic blood loss 11/18/2019   Rectal cancer (HCC) 11/17/2019   Generalized anxiety disorder 10/26/2019   Diabetes mellitus (HCC) 10/26/2019   Ischemic stroke (HCC) 10/08/2019   Hypokalemia    Change in bowel habits    Rectal bleeding    Polyp of descending colon    Diverticulosis of colon without hemorrhage    Gastrointestinal hemorrhage 08/29/2019   Coronary artery disease 08/29/2019   Ischemic cardiomyopathy 08/29/2019   Atrial fibrillation with RVR (HCC)    Hypertension associated with type 2 diabetes mellitus (HCC)    Hyperlipidemia LDL goal <70    Ventricular tachycardia (HCC) 06/04/2019   PCP:  Stephanie Charlene CROME, MD Pharmacy:   [facility] [phone] [address] [place], [place] - [address] AT [facility] [address] [place] [address] Phone: [phone] Fax: [phone]  DARRYLE LONG - [facility] [address] [place] [address] Phone: [phone] Fax: [phone]  [facility] [address] [place], [place] - [address] [place] [address] Phone: [phone] Fax: [phone]  [facility] [address] [place] [address] Phone: [phone] Fax: [phone]     Social Drivers of Health (SDOH) Social History: SDOH Screenings   Food Insecurity: No Food Insecurity (12/03/2023)  Housing: Low Risk  (12/03/2023)  Transportation Needs: No Transportation Needs (12/03/2023)  Utilities: Not At Risk (12/03/2023)  Alcohol Screen: Low Risk  (10/12/2020)  Depression (PHQ2-9): Low Risk  (03/04/2023)  Social Connections: Patient Declined (12/03/2023)  Tobacco Use: Medium Risk (12/03/2023)   SDOH Interventions:     Readmission Risk Interventions      No data to display

## 2023-12-04 NOTE — Assessment & Plan Note (Addendum)
 Atelectasis vs. CAP on chest x-ray. He continues without SOB, cough, sick symptoms. On exam, he is well appearing on RA and lungs are CTAB. His BP is improving, but not completely at baseline. He is afrebile and does not have a leukocytosis; however, he is immunosuppressed on chemotherapy. His lactic acidosis is improving, and I suspect this was more so due to his anemia as opposed to infection. He was covered him 24 hours of Rocephin  and Azithromycin  by the ER for pneumonia. At this time I do not think he needs further abx treatment for PNA. We will hold off and see how he does today.

## 2023-12-05 DIAGNOSIS — R195 Other fecal abnormalities: Secondary | ICD-10-CM

## 2023-12-05 DIAGNOSIS — D509 Iron deficiency anemia, unspecified: Secondary | ICD-10-CM | POA: Diagnosis not present

## 2023-12-05 DIAGNOSIS — Z85048 Personal history of other malignant neoplasm of rectum, rectosigmoid junction, and anus: Secondary | ICD-10-CM | POA: Diagnosis not present

## 2023-12-05 DIAGNOSIS — D649 Anemia, unspecified: Secondary | ICD-10-CM | POA: Diagnosis not present

## 2023-12-05 DIAGNOSIS — R7989 Other specified abnormal findings of blood chemistry: Secondary | ICD-10-CM | POA: Diagnosis not present

## 2023-12-05 LAB — COMPREHENSIVE METABOLIC PANEL WITH GFR
ALT: 233 U/L — ABNORMAL HIGH (ref 0–44)
AST: 60 U/L — ABNORMAL HIGH (ref 15–41)
Albumin: 2.9 g/dL — ABNORMAL LOW (ref 3.5–5.0)
Alkaline Phosphatase: 49 U/L (ref 38–126)
Anion gap: 9 (ref 5–15)
BUN: 22 mg/dL (ref 8–23)
CO2: 24 mmol/L (ref 22–32)
Calcium: 8.5 mg/dL — ABNORMAL LOW (ref 8.9–10.3)
Chloride: 104 mmol/L (ref 98–111)
Creatinine, Ser: 1.27 mg/dL — ABNORMAL HIGH (ref 0.61–1.24)
GFR, Estimated: >60 mL/min (ref >60)
Glucose, Bld: 210 mg/dL — ABNORMAL HIGH (ref 70–99)
Potassium: 3.2 mmol/L — ABNORMAL LOW (ref 3.5–5.1)
Sodium: 137 mmol/L (ref 135–145)
Total Bilirubin: 1.8 mg/dL — ABNORMAL HIGH (ref 0.0–1.2)
Total Protein: 5.4 g/dL — ABNORMAL LOW (ref 6.5–8.1)

## 2023-12-05 LAB — CBC
HCT: 15.1 % — ABNORMAL LOW (ref 39.0–52.0)
HCT: 28 % — ABNORMAL LOW (ref 39.0–52.0)
HCT: 17.8 % — ABNORMAL LOW (ref 39.0–52.0)
HCT: 30.2 % — ABNORMAL LOW (ref 39.0–52.0)
Hemoglobin: 5.2 g/dL — CL (ref 13.0–17.0)
Hemoglobin: 9.9 g/dL — ABNORMAL LOW (ref 13.0–17.0)
Hemoglobin: 6 g/dL — CL (ref 13.0–17.0)
Hemoglobin: 10.5 g/dL — ABNORMAL LOW (ref 13.0–17.0)
MCH: 32.5 pg (ref 26.0–34.0)
MCH: 31.7 pg (ref 26.0–34.0)
MCH: 31.6 pg (ref 26.0–34.0)
MCH: 31.7 pg (ref 26.0–34.0)
MCHC: 34.4 g/dL (ref 30.0–36.0)
MCHC: 35.4 g/dL (ref 30.0–36.0)
MCHC: 33.7 g/dL (ref 30.0–36.0)
MCHC: 34.8 g/dL (ref 30.0–36.0)
MCV: 94.4 fL (ref 80.0–100.0)
MCV: 89.7 fL (ref 80.0–100.0)
MCV: 93.7 fL (ref 80.0–100.0)
MCV: 91.2 fL (ref 80.0–100.0)
Platelets: 111 K/uL — ABNORMAL LOW (ref 150–400)
Platelets: 174 K/uL (ref 150–400)
Platelets: 112 K/uL — ABNORMAL LOW (ref 150–400)
Platelets: 182 K/uL (ref 150–400)
RBC: 1.6 MIL/uL — ABNORMAL LOW (ref 4.22–5.81)
RBC: 3.12 MIL/uL — ABNORMAL LOW (ref 4.22–5.81)
RBC: 1.9 MIL/uL — ABNORMAL LOW (ref 4.22–5.81)
RBC: 3.31 MIL/uL — ABNORMAL LOW (ref 4.22–5.81)
RDW: 17.6 % — ABNORMAL HIGH (ref 11.5–15.5)
RDW: 16.8 % — ABNORMAL HIGH (ref 11.5–15.5)
RDW: 17.2 % — ABNORMAL HIGH (ref 11.5–15.5)
RDW: 17.2 % — ABNORMAL HIGH (ref 11.5–15.5)
WBC: 3.4 K/uL — ABNORMAL LOW (ref 4.0–10.5)
WBC: 3.8 K/uL — ABNORMAL LOW (ref 4.0–10.5)
WBC: 2.3 K/uL — ABNORMAL LOW (ref 4.0–10.5)
WBC: 3.9 K/uL — ABNORMAL LOW (ref 4.0–10.5)
nRBC: 3.5 % — ABNORMAL HIGH (ref 0.0–0.2)
nRBC: 4.2 % — ABNORMAL HIGH (ref 0.0–0.2)
nRBC: 3.9 % — ABNORMAL HIGH (ref 0.0–0.2)
nRBC: 5.1 % — ABNORMAL HIGH (ref 0.0–0.2)

## 2023-12-05 LAB — ERYTHROPOIETIN: Erythropoietin: 416.8 m[IU]/mL — ABNORMAL HIGH (ref 2.6–18.5)

## 2023-12-05 LAB — HEMOGLOBIN A1C
Hgb A1c MFr Bld: 6 % — ABNORMAL HIGH (ref 4.8–5.6)
Mean Plasma Glucose: 126 mg/dL

## 2023-12-05 LAB — GLUCOSE, CAPILLARY
Glucose-Capillary: 202 mg/dL — ABNORMAL HIGH (ref 70–99)
Glucose-Capillary: 225 mg/dL — ABNORMAL HIGH (ref 70–99)
Glucose-Capillary: 157 mg/dL — ABNORMAL HIGH (ref 70–99)
Glucose-Capillary: 166 mg/dL — ABNORMAL HIGH (ref 70–99)

## 2023-12-05 LAB — HAPTOGLOBIN: Haptoglobin: 130 mg/dL (ref 32–363)

## 2023-12-05 LAB — HEMOGLOBIN AND HEMATOCRIT, BLOOD
HCT: 29.9 % — ABNORMAL LOW (ref 39.0–52.0)
Hemoglobin: 10.5 g/dL — ABNORMAL LOW (ref 13.0–17.0)

## 2023-12-05 LAB — BILIRUBIN, FRACTIONATED(TOT/DIR/INDIR)
Bilirubin, Direct: 0.3 mg/dL — ABNORMAL HIGH (ref 0.0–0.2)
Indirect Bilirubin: 1.2 mg/dL — ABNORMAL HIGH (ref 0.3–0.9)
Total Bilirubin: 1.5 mg/dL — ABNORMAL HIGH (ref 0.0–1.2)

## 2023-12-05 LAB — MAGNESIUM: Magnesium: 2.2 mg/dL (ref 1.7–2.4)

## 2023-12-05 LAB — APTT: aPTT: 33 s (ref 24–36)

## 2023-12-05 MED ORDER — NA SULFATE-K SULFATE-MG SULF 17.5-3.13-1.6 GM/177ML PO SOLN
0.5000 | Freq: Once | ORAL | Status: AC
  Administered 2023-12-05: 177 mL via ORAL

## 2023-12-05 MED ORDER — SODIUM CHLORIDE (PF) 0.9 % IJ SOLN
INTRAMUSCULAR | Status: AC
  Filled 2023-12-05: qty 10

## 2023-12-05 MED ORDER — FOLIC ACID 1 MG PO TABS
2.0000 mg | ORAL_TABLET | Freq: Every day | ORAL | Status: DC
  Administered 2023-12-05 – 2023-12-07 (×3): 2 mg via ORAL
  Filled 2023-12-05 (×3): qty 2

## 2023-12-05 MED ORDER — METOCLOPRAMIDE HCL 5 MG/ML IJ SOLN
10.0000 mg | Freq: Once | INTRAMUSCULAR | Status: AC
  Administered 2023-12-05: 10 mg via INTRAVENOUS
  Filled 2023-12-05: qty 2

## 2023-12-05 MED ORDER — POTASSIUM CHLORIDE 10 MEQ/100ML IV SOLN
10.0000 meq | INTRAVENOUS | Status: AC
  Administered 2023-12-05 (×4): 10 meq via INTRAVENOUS
  Filled 2023-12-05 (×4): qty 100

## 2023-12-05 MED ORDER — HEPARIN (PORCINE) 25000 UT/250ML-% IV SOLN
1350.0000 [IU]/h | INTRAVENOUS | Status: AC
  Administered 2023-12-05: 1200 [IU]/h via INTRAVENOUS
  Filled 2023-12-05: qty 250

## 2023-12-05 MED ORDER — SODIUM CHLORIDE (PF) 0.9 % IJ SOLN
INTRAMUSCULAR | Status: AC
  Administered 2023-12-05: 10 mL
  Filled 2023-12-05: qty 10

## 2023-12-05 MED ORDER — NA SULFATE-K SULFATE-MG SULF 17.5-3.13-1.6 GM/177ML PO SOLN
0.5000 | Freq: Once | ORAL | Status: AC
  Administered 2023-12-05: 177 mL via ORAL
  Filled 2023-12-05: qty 1

## 2023-12-05 NOTE — Progress Notes (Incomplete)
 ANTICOAGULATION CONSULT NOTE  Pharmacy Consult for Heparin  Indication: atrial fibrillation  Patient Measurements: Height: 5' 10 (177.8 cm) Weight: 97.5 kg (215 lb) IBW/kg (Calculated) : 73 Heparin  Dosing Weight: 93.1 kg  Vital Signs: Temp: 98.3 F (36.8 C) (08/07 1103) Temp Source: Oral (08/07 1103) BP: 90/62 (08/07 1103) Pulse Rate: 93 (08/07 1103)  Labs: Recent Labs    12/03/23 0957 12/03/23 1157 12/03/23 1647 12/04/23 0149 12/04/23 0412 12/04/23 0921 12/04/23 1220 12/04/23 1600 12/05/23 0600 12/05/23 0916 12/05/23 1029  HGB 3.6*  --  6.6* 7.8*  --    < >  --  5.2* 9.9* 6.0* 10.5*  HCT 10.5*  --  18.9* 21.6*  --    < >  --  15.1* 28.0* 17.8* 29.9*  PLT 253  --   --  179  --    < >  --  111* 174 112*  --   APTT  --   --  43* 54*  --   --  66*  --   --   --   --   LABPROT  --   --  29.0*  --   --   --   --   --   --   --   --   INR  --   --  2.6*  --   --   --   --   --   --   --   --   HEPARINUNFRC  --   --   --  >1.10*  --   --   --   --   --   --   --   CREATININE 2.08*  --   --   --  1.54*  --   --   --  1.27*  --   --   TROPONINIHS 520* 498*  --   --   --   --   --   --   --   --   --    < > = values in this interval not displayed.    Estimated Creatinine Clearance: 64.3 mL/min (A) (by C-G formula based on SCr of 1.27 mg/dL (H)).  Assessment: 61 yom with a history of AF, CVA, colon cancer, CAD, HTN, HLD, ischemic cardiomyopathy, T2DM. Patient is presenting with AF. Heparin  per pharmacy consult placed for atrial fibrillation.  Patient being seen by GI for concern for GIB with low hemoglobin requiring blood transfusion. Patient is on apixaban  prior to arrival. Last dose 8/4 1800. Will require aPTT monitoring due to likely falsely high anti-Xa level secondary to DOAC use.  Discussed with Dr. Stoney, GI requesting bridge with heparin  and hold apixaban . GI requests no missed anticoagulation citing high stroke risk and patient is worried. They are requesting  additional transfusion and begin heparin . After discussing with Dr. Stoney will opt for lower goal levels until bleeding risk is lower.  On 8/6 pt Hgb dropped from 8 > 5.2 and heparin  was discontinued. Pt s/p 2 units RBC and Hgb now 10.5.   Colonoscopy has been pushed to 8/8 because pt did not complete bowel prep. Given colonoscopy is now on 8/8, will plan to restart heparin . Will continue to target lower end of goal aPTT, due to risk of GI bleed. As a result, will not bolus and start at 12 units/kg/hr.  Goal of Therapy:  Heparin  level 0.3-0.5 units/ml aPTT 66-85seconds Monitor platelets by anticoagulation protocol: Yes   Plan:  Start heparin  infusion at 1200 units/hr  Check aPTT and HL in 6 hours and daily while on heparin   Continue to monitor H&H and platelets   Thank you for allowing pharmacy to be a part of this patient's care    Prentice DOROTHA Favors, PharmD PGY1 Health-System Pharmacy Administration and Leadership Resident [facility]  12/05/2023 11:52 AM

## 2023-12-05 NOTE — Progress Notes (Signed)
 Hopefully, he will have his upper endoscopy and colonoscopy today.  I have to believe that he is bleeding.  However, I will trouble by the fact that his reticulocyte count is not high at all.  As such, there may be some marrow dysfunction.  I suppose marrow dysfunction could be secondary to his past treatments and possibly the Lonsurf .  At least, his LFTs are coming down.  I suppose it might be from his profound anemia and may be some hypoperfusion.  He does have some chronic renal insufficiency.  I suspect that his erythropoietin  level is going to be on the low side.  This morning, his white cell count 3.8.  Hemoglobin 9.9.  Platelet count 174,000.  Sodium 137.  Potassium 3.2.  BUN 22 creatinine 1.27.  Calcium  8.5 with an albumin of 2.9.  He says he feels okay.  His cardiac status is pretty stable.  He still has not atrial fibrillation.  It will be incredibly interesting to see what the endoscopies will show.  I probably get him on folic acid , if he is on this already.  I do not think he needs any iron.  His iron studies look okay.  Jeralyn Crease, MD  [place] 1:5

## 2023-12-05 NOTE — H&P (View-Only) (Signed)
 Daily Progress Note  DOA: 12/03/2023 Hospital Day: 3    ASSESSMENT    # 68 year old male with history of metastatic rectal cancer treated with chemotherapy and radiation (patient opted against surgical resection).  Admitted with severe anemia Had good response to treatment. 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   Rectal biopsies at the area were negative for recurrent cancer.     # Profound normocytic anemia / FOBT+  Presenting hemoglobin 3.6, down from around 10 in June in the absence of any overt bleeding.  Iron studies not compatible with iron deficiency. In fact may have iron overload and need eventual hematochromatosis workup if not already done  TODAY>> Improvement in hgb post transfusion to 8 but then down to 5.2 overnight  without any overt bleeding ( ? Spurious result). Transfused 2 additional units RBCs overnight and hgb improved to 9.9.  Unfortunately he tolerated very little of the GoLytely  yesterday and only 1 dose of Suprep last night due to nausea and vomiting. Stool described as brown liquid.   # Mildly elevated LFTs AKI Elevated troponin Suspect all these abnormalities were due to severe anemia. His Tbili has been intermittently elevated for years.   Hep panel negative. RUQ shows fatty liver, nothing acute.  No gallstones and CBD only 2.3 mm  TODAY>> Renal function improving.  LFTs improving.   # Atrial fibrillation Eliquis  on hold, getting IV heparin .    # History of CVA (while off of Plavix )  Principal Problem:   Symptomatic anemia Active Problems:   Atrial fibrillation with RVR (HCC)   Chronic health problem   Elevated troponin   Community acquired pneumonia of left lower lobe of lung   AKI (acute kidney injury) (HCC)   Transaminitis   Infiltrate of lung present on chest x-ray   Atherosclerosis of native coronary artery of native heart without angina pectoris    Heme positive stool   PLAN   --Offered NG tube placement to facilitate bowel prep for colonoscopy.  He prefers to reattempt drinking the prep.  Will keep on clear liquids today.  Will try Suprep with IV Reglan  before each dose.   -- Plan for colonoscopy tomorrow at 9 AM.  Will shut heparin  off at 5 AM  Subjective   Unable to tolerate most of the bowel prep due to nausea and had an episode of vomiting this morning.  Stools described as loose but brown.    Objective   GI Studies:    Recent Labs    12/04/23 0921 12/04/23 1600 12/05/23 0600  WBC 5.5 3.4* 3.8*  HGB 8.0* 5.2* 9.9*  HCT 22.7* 15.1* 28.0*  MCV 90.8 94.4 89.7  PLT 187 111* 174   Recent Labs    12/03/23 1647 12/04/23 0149  FOLATE  --  7.0  VITAMINB12 661  --   FERRITIN 2,157*  --   TIBC 255  --   IRONPCTSAT 48*  --    Recent Labs    12/03/23 0957 12/04/23 0412 12/05/23 0600  NA 136 134* 137  K 3.6 3.1* 3.2*  CL 101 100 104  CO2 19* 20* 24  GLUCOSE 240* 339* 210*  BUN 55* 40* 22  CREATININE 2.08* 1.54* 1.27*  CALCIUM  9.0 8.7* 8.5*   Recent Labs    12/03/23 0957 12/04/23 0412 12/05/23 0600  PROT 6.0* 5.7* 5.4*  ALBUMIN 3.3* 3.2* 2.9*  AST 164* 114* 60*  ALT  217* 266* 233*  ALKPHOS 47 52 49  BILITOT 1.5* 2.1* 1.8*      Imaging:  ECHOCARDIOGRAM COMPLETE    ECHOCARDIOGRAM REPORT       Patient Name:   John Douglas Date of Exam: 12/04/2023 Medical Rec #:  [id number]     Height:       70.0 in Accession #:    [id number]    Weight:       215.0 lb Date of Birth:  06-14-1955    BSA:          2.152 m Patient Age:    68 years      BP:           113/69 mmHg Patient Gender: M             HR:           111 bpm. Exam Location:  Inpatient  Procedure: 2D Echo, Cardiac Doppler, Color Doppler and Intracardiac            Opacification Agent (Both Spectral and Color Flow Doppler were            utilized during procedure).  Indications:    A fib   History:        Patient has prior history of  Echocardiogram examinations, most                 recent 03/04/2021. Arrythmias:Atrial Fibrillation.   Sonographer:    Benard Stallion Referring Phys: [id number] MARGARET E PRAY  IMPRESSIONS   1. Left ventricular ejection fraction, by estimation, is 45 to 50% with beat to beat variability. The left ventricle has mildly decreased function. The left ventricle demonstrates global hypokinesis. There is mild left ventricular hypertrophy. Left  ventricular diastolic parameters are indeterminate.  2. Right ventricular systolic function is normal. The right ventricular size is normal. Tricuspid regurgitation signal is inadequate for assessing PA pressure.  3. Left atrial size was moderately dilated.  4. The mitral valve is grossly normal. Trivial mitral valve regurgitation. No evidence of mitral stenosis.  5. The aortic valve is abnormal. There is moderate calcification of the aortic valve. Aortic valve regurgitation is not visualized. Aortic valve sclerosis/calcification is present, without any evidence of aortic stenosis.  6. The inferior vena cava is normal in size with greater than 50% respiratory variability, suggesting right atrial pressure of 3 mmHg.  FINDINGS  Left Ventricle: Left ventricular ejection fraction, by estimation, is 45 to 50%. The left ventricle has mildly decreased function. The left ventricle demonstrates global hypokinesis. Definity  contrast agent was given IV to delineate the left ventricular  endocardial borders. The left ventricular internal cavity size was normal in size. There is mild left ventricular hypertrophy. Left ventricular diastolic parameters are indeterminate.  Right Ventricle: The right ventricular size is normal. No increase in right ventricular wall thickness. Right ventricular systolic function is normal. Tricuspid regurgitation signal is inadequate for assessing PA pressure. The tricuspid regurgitant  velocity is 1.48 m/s, and with an assumed right atrial  pressure of 3 mmHg, the estimated right ventricular systolic pressure is 11.8 mmHg.  Left Atrium: Left atrial size was moderately dilated.  Right Atrium: Right atrial size was normal in size.  Pericardium: There is no evidence of pericardial effusion.  Mitral Valve: The mitral valve is grossly normal. Trivial mitral valve regurgitation. No evidence of mitral valve stenosis.  Tricuspid Valve: The tricuspid valve is normal in structure. Tricuspid valve regurgitation is trivial. No evidence of tricuspid stenosis.  Aortic Valve: The aortic valve is abnormal. There is moderate calcification of the aortic valve. Aortic valve regurgitation is not visualized. Aortic valve sclerosis/calcification is present, without any evidence of aortic stenosis. Aortic valve mean  gradient measures 5.0 mmHg. Aortic valve peak gradient measures 9.9 mmHg. Aortic valve area, by VTI measures 2.50 cm.  Pulmonic Valve: The pulmonic valve was normal in structure. Pulmonic valve regurgitation is trivial. No evidence of pulmonic stenosis.  Aorta: The aortic root is normal in size and structure.  Venous: The inferior vena cava is normal in size with greater than 50% respiratory variability, suggesting right atrial pressure of 3 mmHg.  IAS/Shunts: No atrial level shunt detected by color flow Doppler.    LEFT VENTRICLE PLAX 2D LVIDd:         5.20 cm LVIDs:         3.70 cm LV PW:         1.00 cm LV IVS:        1.00 cm LVOT diam:     2.30 cm LV SV:         61 LV SV Index:   29 LVOT Area:     4.15 cm    RIGHT VENTRICLE RV S prime:     10.90 cm/s TAPSE (M-mode): 2.1 cm  LEFT ATRIUM              Index        RIGHT ATRIUM           Index LA diam:        4.20 cm  1.95 cm/m   RA Area:     20.80 cm LA Vol (A2C):   168.0 ml 78.05 ml/m  RA Volume:   51.40 ml  23.88 ml/m LA Vol (A4C):   74.8 ml  34.75 ml/m LA Biplane Vol: 116.0 ml 53.89 ml/m  AORTIC VALVE AV Area (Vmax):    2.62 cm AV Area (Vmean):   2.63  cm AV Area (VTI):     2.50 cm AV Vmax:           157.00 cm/s AV Vmean:          106.000 cm/s AV VTI:            0.246 m AV Peak Grad:      9.9 mmHg AV Mean Grad:      5.0 mmHg LVOT Vmax:         99.10 cm/s LVOT Vmean:        67.100 cm/s LVOT VTI:          0.148 m LVOT/AV VTI ratio: 0.60   AORTA Ao Root diam: 3.30 cm  MITRAL VALVE                TRICUSPID VALVE MV Area (PHT): 4.96 cm     TR Peak grad:   8.8 mmHg MV Decel Time: 153 msec     TR Vmax:        148.00 cm/s MV E velocity: 142.00 cm/s                             SHUNTS                             Systemic VTI:  0.15 m  Systemic Diam: 2.30 cm  Soyla Merck MD Electronically signed by Soyla Merck MD Signature Date/Time: 12/04/2023/2:01:46 PM      Final       Scheduled inpatient medications:   sodium chloride    Intravenous Once   sodium chloride    Intravenous Once   Chlorhexidine  Gluconate Cloth  6 each Topical Daily   folic acid   2 mg Oral Daily   insulin  aspart  0-9 Units Subcutaneous TID WC   metoprolol  tartrate  25 mg Oral BID   Na Sulfate-K Sulfate-Mg Sulfate concentrate  0.5 kit Oral Once   pantoprazole  (PROTONIX ) IV  40 mg Intravenous Q12H   simethicone   240 mg Oral Once   sodium chloride  (PF)       sodium chloride  flush  10-40 mL Intracatheter Q12H   Continuous inpatient infusions:   sodium chloride  10 mL/hr at 12/04/23 1747   potassium chloride  10 mEq (12/05/23 0902)   PRN inpatient medications: mouth rinse, sodium chloride  (PF), sodium chloride  flush  Vital signs in last 24 hours: Temp:  [98 F (36.7 C)-98.8 F (37.1 C)] 98.3 F (36.8 C) (08/07 0712) Pulse Rate:  [92-128] 126 (08/07 0852) Resp:  [12-20] 12 (08/07 0712) BP: (92-137)/(72-86) 108/73 (08/07 0852) SpO2:  [92 %-100 %] 99 % (08/07 0712) Last BM Date : 12/04/23  Intake/Output Summary (Last 24 hours) at 12/05/2023 0921 Last data filed at 12/05/2023 0600 Gross per 24 hour  Intake 1199.5 ml  Output  300 ml  Net 899.5 ml    Intake/Output from previous day: 08/06 0701 - 08/07 0700 In: 1211.6 [P.O.:360; I.V.:209.3; Blood:642.3] Out: 300 [Urine:300] Intake/Output this shift: No intake/output data recorded.   Physical Exam:  General: Alert male in NAD Heart:  Regular rate and rhythm.  Pulmonary: Normal respiratory effort Abdomen: Soft, nondistended, nontender. Normal bowel sounds. Neurologic: Alert and oriented Psych: Pleasant. Cooperative     LOS: 2 days   Vina Dasen ,NP 12/05/2023, 9:21 AM

## 2023-12-05 NOTE — Progress Notes (Signed)
 ANTICOAGULATION CONSULT NOTE  Pharmacy Consult for Heparin  Indication: atrial fibrillation  Patient Measurements: Height: 5' 10 (177.8 cm) Weight: 97.5 kg (215 lb) IBW/kg (Calculated) : 73 Heparin  Dosing Weight: 93.1 kg  Vital Signs: Temp: 98.3 F (36.8 C) (08/07 1103) Temp Source: Oral (08/07 1103) BP: 90/62 (08/07 1103) Pulse Rate: 93 (08/07 1103)  Labs: Recent Labs    12/03/23 0957 12/03/23 1157 12/03/23 1647 12/04/23 0149 12/04/23 0412 12/04/23 0921 12/04/23 1220 12/04/23 1600 12/05/23 0600 12/05/23 0916 12/05/23 1029  HGB 3.6*  --  6.6* 7.8*  --    < >  --  5.2* 9.9* 6.0* 10.5*  HCT 10.5*  --  18.9* 21.6*  --    < >  --  15.1* 28.0* 17.8* 29.9*  PLT 253  --   --  179  --    < >  --  111* 174 112*  --   APTT  --   --  43* 54*  --   --  66*  --   --   --   --   LABPROT  --   --  29.0*  --   --   --   --   --   --   --   --   INR  --   --  2.6*  --   --   --   --   --   --   --   --   HEPARINUNFRC  --   --   --  >1.10*  --   --   --   --   --   --   --   CREATININE 2.08*  --   --   --  1.54*  --   --   --  1.27*  --   --   TROPONINIHS 520* 498*  --   --   --   --   --   --   --   --   --    < > = values in this interval not displayed.    Estimated Creatinine Clearance: 64.3 mL/min (A) (by C-G formula based on SCr of 1.27 mg/dL (H)).  Assessment: 42 yom with a history of AF, CVA, colon cancer, CAD, HTN, HLD, ischemic cardiomyopathy, T2DM. Patient is presenting with AF. Heparin  per pharmacy consult placed for atrial fibrillation.  Patient being seen by GI for concern for GIB with low hemoglobin requiring blood transfusion. Patient is on apixaban  prior to arrival. Last dose 8/4 1800. Will require aPTT monitoring due to likely falsely high anti-Xa level secondary to DOAC use.  Discussed with Dr. Stoney, GI requesting bridge with heparin  and hold apixaban . GI requests no missed anticoagulation citing high stroke risk and patient is worried. They are requesting  additional transfusion and begin heparin . After discussing with Dr. Stoney will opt for lower goal levels until bleeding risk is lower.  8/7: Heparin  stopped overnight due to a drop in hgb-patient received multiple units of blood leading to a rapid increase in Hgb. This rapid increase raises concerns for myelosuppression d/t the patient's chemotherapy as opposed to a GIB, though will continue to target lower end of goal. EGD and colonoscopy planned for tomorrow, per GI note. Will hold heparin  at 0400 tomorrow for colonoscopy.   Goal of Therapy:  Heparin  level 0.3-0.5 units/ml aPTT 66-85seconds Monitor platelets by anticoagulation protocol: Yes   Plan:  Resume heparin  infusion at 1200 units/hr  Check aPTT in 6 hours and daily while  on heparin   Continue to monitor H&H and platelets   Thank you for allowing pharmacy to be a part of this patient's care    Massie Fila, PharmD Clinical Pharmacist  12/05/2023 3:47 PM

## 2023-12-05 NOTE — Assessment & Plan Note (Addendum)
 Rates primarily controlled <110. Did have 4 runs of likely RVR on tele overnight. Patient BP is tolerating Lopressor  - consulted cardiology - Lopressor  25 mg BID, potentially transition to Toprol  if cards recommends - heparin  gtt will be held around scope tomorrow

## 2023-12-05 NOTE — Anesthesia Preprocedure Evaluation (Signed)
 Anesthesia Evaluation  Patient identified by MRN, date of birth, ID band Patient awake    Reviewed: Allergy & Precautions, NPO status , Patient's Chart, lab work & pertinent test results, reviewed documented beta blocker date and time   History of Anesthesia Complications Negative for: history of anesthetic complications  Airway Mallampati: IV  TM Distance: >3 FB Neck ROM: Full    Dental no notable dental hx.    Pulmonary neg pulmonary ROS   Pulmonary exam normal        Cardiovascular hypertension, Pt. on home beta blockers and Pt. on medications + CAD, + Past MI (2020) and + Cardiac Stents  Normal cardiovascular exam+ dysrhythmias Atrial Fibrillation      Neuro/Psych   Anxiety     CVA, No Residual Symptoms  negative psych ROS   GI/Hepatic Neg liver ROS,,,Rectal ca   Endo/Other  diabetes, Type 2, Oral Hypoglycemic Agents    Renal/GU negative Renal ROS  negative genitourinary   Musculoskeletal negative musculoskeletal ROS (+)    Abdominal   Peds  Hematology  (+) Blood dyscrasia, anemia   Anesthesia Other Findings Day of surgery medications reviewed with patient.  Reproductive/Obstetrics negative OB ROS                              Anesthesia Physical Anesthesia Plan  ASA: 3  Anesthesia Plan: MAC   Post-op Pain Management: Minimal or no pain anticipated   Induction:   PONV Risk Score and Plan: 1 and Treatment may vary due to age or medical condition and Propofol  infusion  Airway Management Planned: Natural Airway and Nasal Cannula  Additional Equipment: None  Intra-op Plan:   Post-operative Plan:   Informed Consent: I have reviewed the patients History and Physical, chart, labs and discussed the procedure including the risks, benefits and alternatives for the proposed anesthesia with the patient or authorized representative who has indicated his/her understanding and  acceptance.       Plan Discussed with: CRNA  Anesthesia Plan Comments:          Anesthesia Quick Evaluation

## 2023-12-05 NOTE — Progress Notes (Signed)
 RN opened box of Suprep to start prep as ordered. Upon opening box only one bottle of prep in box. Contacted pharmacy to request new prep. Pharmacy ordering new prep to be sent. Awaiting new prep to start.

## 2023-12-05 NOTE — Assessment & Plan Note (Addendum)
 Lungs remain CTAB and patient asymptomatic. Do not think this is a pneumonia picture.

## 2023-12-05 NOTE — Assessment & Plan Note (Addendum)
 He initially received 4 pRBC and Hgb increased from 3.6 to 7.8-8.0. On evening CBC, his hgb was 5.2 and he received 2 pRBC and hgb was 9.9. It is possible the 5.2 was a lab error based on the response to 2 units. Reticulocytes are low, but EPO is appropriately elevated. Supports consideration of marrow suppression.  - GI following - EGD and colonoscopy postponed to tomorrow as he was unable to finish bowel prep. - oncology following - eval EPO level - cardiology following - CBC q8h  - AM CMP, Mg - added on Mg to today's labs - Kcl 40 meq IV - Protonix  40 mg BID

## 2023-12-05 NOTE — Assessment & Plan Note (Signed)
 AST/ALT improving. Suspect ischemia.  - AM CMP

## 2023-12-05 NOTE — Assessment & Plan Note (Signed)
 Diabetes: A1C 6.0, BG 210-271, sensitive sliding scale insulin  ordered, do not have a reason to require tight glucose control during this hospitalization, can consider basal insulin  HLD: holding zetia  until LFT improve.

## 2023-12-05 NOTE — Progress Notes (Addendum)
 Daily Progress Note Intern Pager: [phone]  Patient name: John Douglas Medical record number: [id number] Date of birth: 09/28/1955 Age: 68 y.o. Gender: male  Primary Care Provider: Stephanie Charlene CROME, MD Consultants: GI, cardiology, oncology Code Status: Full  Pt Overview and Major Events to Date:  12/03/23 - admitted 12/03/23 - 4 pRBC 12/04/23 - 2 pRBC  Assessment and Plan:  This is a 68 year old male admitted for symptomatic anemia concerning for an GI bleed. Pertinent PMH/PSH includes rectal cancer on chemotherapy, afib. Oncology does think he could have marrow suppression from his chemo, given his low retic count. GI plans to scope today. Assessment & Plan Symptomatic anemia He initially received 4 pRBC and Hgb increased from 3.6 to 7.8-8.0. On evening CBC, his hgb was 5.2 and he received 2 pRBC and hgb was 9.9. It is possible the 5.2 was a lab error based on the response to 2 units. Reticulocytes are low, but EPO is appropriately elevated. Supports consideration of marrow suppression.  - GI following - EGD and colonoscopy postponed to tomorrow as he was unable to finish bowel prep. - oncology following - eval EPO level - cardiology following - CBC q8h  - AM CMP, Mg - added on Mg to today's labs - Kcl 40 meq IV - Protonix  40 mg BID Atrial fibrillation with RVR (HCC) Rates primarily controlled <110. Did have 4 runs of likely RVR on tele overnight. Patient BP is tolerating Lopressor  - consulted cardiology - Lopressor  25 mg BID, potentially transition to Toprol  if cards recommends - heparin  gtt will be held around scope tomorrow Infiltrate of lung present on chest x-ray Lungs remain CTAB and patient asymptomatic. Do not think this is a pneumonia picture. Atherosclerosis of native coronary artery of native heart without angina pectoris Continues to be asymptomatic. - cardiology consulted AKI (acute kidney injury) (HCC) Cr continues to improve with volume resuscitation. -  Continue to support volume status as indicated by clinical picture and Hgb - AM CMP Transaminitis AST/ALT improving. Suspect ischemia.  - AM CMP Chronic health problem Diabetes: A1C 6.0, BG 210-271, sensitive sliding scale insulin  ordered, do not have a reason to require tight glucose control during this hospitalization, can consider basal insulin  HLD: holding zetia  until LFT improve.  FEN/GI: CLD PPx: heparin  gtt Dispo:Home pending clinical improvement .   Subjective:  He reports he is feeling better. No CP, SOB, abd pain.   Objective: Temp:  [97.8 F (36.6 C)-98.8 F (37.1 C)] 98.3 F (36.8 C) (08/07 0430) Pulse Rate:  [92-128] 95 (08/07 0430) Resp:  [16-20] 16 (08/07 0430) BP: (92-137)/(69-86) 103/77 (08/07 0430) SpO2:  [92 %-100 %] 99 % (08/07 0430)  Physical Exam: General: sitting reclined in bed, well  Cardiovascular: regular rhythm and rate, no m/r/g Respiratory: CTAB, no increased WOB Abdomen: soft, nontender Extremities: no edema  Laboratory: Most recent CBC Lab Results  Component Value Date   WBC 3.8 (L) 12/05/2023   HGB 9.9 (L) 12/05/2023   HCT 28.0 (L) 12/05/2023   MCV 89.7 12/05/2023   PLT 174 12/05/2023   Most recent BMP    Latest Ref Rng & Units 12/05/2023    6:00 AM  BMP  Glucose 70 - 99 mg/dL 789   BUN 8 - 23 mg/dL 22   Creatinine 9.38 - 1.24 mg/dL 8.72   Sodium 864 - 854 mmol/L 137   Potassium 3.5 - 5.1 mmol/L 3.2   Chloride 98 - 111 mmol/L 104   CO2 22 -  32 mmol/L 24   Calcium  8.9 - 10.3 mg/dL 8.5     Other pertinent labs  A1c was 6.0  AST/ALT  60/233  Haptoglobin 130 wnl  EPO elevated at 416.8  Imaging/Diagnostic Tests:  Echo 12/04/23 Left ventricular ejection fraction:45 to 50%. The left ventricle has mildly decreased function. The left ventricle demonstrates global hypokinesis. There is mild left ventricular hypertrophy.  2. Right ventricular systolic function is normal. The right ventricular size is normal. Tricuspid  regurgitation signal is inadequate for assessing PA pressure.  3. Left atrial size was moderately dilated.  4. The mitral valve is grossly normal. Trivial mitral valve regurgitation. No evidence of mitral stenosis.  5. The aortic valve is abnormal. There is moderate calcification of the aortic valve. Aortic valve regurgitation is not visualized. Aortic valve sclerosis/ calcification is present, without any evidence of aortic stenosis.   Alena Morrison, [place], MD 12/05/2023, 7:12 AM  PGY-1, [facility] FPTS Intern pager: [phone], text pages welcome Secure chat group [facility] Teaching Service

## 2023-12-05 NOTE — Assessment & Plan Note (Signed)
 Cr continues to improve with volume resuscitation. - Continue to support volume status as indicated by clinical picture and Hgb - AM CMP

## 2023-12-05 NOTE — Progress Notes (Signed)
 Daily Progress Note  DOA: 12/03/2023 Hospital Day: 3    ASSESSMENT    # 68 year old male with history of metastatic rectal cancer treated with chemotherapy and radiation (patient opted against surgical resection).  Admitted with severe anemia Had good response to treatment. 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   Rectal biopsies at the area were negative for recurrent cancer.     # Profound normocytic anemia / FOBT+  Presenting hemoglobin 3.6, down from around 10 in June in the absence of any overt bleeding.  Iron studies not compatible with iron deficiency. In fact may have iron overload and need eventual hematochromatosis workup if not already done  TODAY>> Improvement in hgb post transfusion to 8 but then down to 5.2 overnight  without any overt bleeding ( ? Spurious result). Transfused 2 additional units RBCs overnight and hgb improved to 9.9.  Unfortunately he tolerated very little of the GoLytely  yesterday and only 1 dose of Suprep last night due to nausea and vomiting. Stool described as brown liquid.   # Mildly elevated LFTs AKI Elevated troponin Suspect all these abnormalities were due to severe anemia. His Tbili has been intermittently elevated for years.   Hep panel negative. RUQ shows fatty liver, nothing acute.  No gallstones and CBD only 2.3 mm  TODAY>> Renal function improving.  LFTs improving.   # Atrial fibrillation Eliquis  on hold, getting IV heparin .    # History of CVA (while off of Plavix )  Principal Problem:   Symptomatic anemia Active Problems:   Atrial fibrillation with RVR (HCC)   Chronic health problem   Elevated troponin   Community acquired pneumonia of left lower lobe of lung   AKI (acute kidney injury) (HCC)   Transaminitis   Infiltrate of lung present on chest x-ray   Atherosclerosis of native coronary artery of native heart without angina pectoris    Heme positive stool   PLAN   --Offered NG tube placement to facilitate bowel prep for colonoscopy.  He prefers to reattempt drinking the prep.  Will keep on clear liquids today.  Will try Suprep with IV Reglan  before each dose.   -- Plan for colonoscopy tomorrow at 9 AM.  Will shut heparin  off at 5 AM  Subjective   Unable to tolerate most of the bowel prep due to nausea and had an episode of vomiting this morning.  Stools described as loose but brown.    Objective   GI Studies:    Recent Labs    12/04/23 0921 12/04/23 1600 12/05/23 0600  WBC 5.5 3.4* 3.8*  HGB 8.0* 5.2* 9.9*  HCT 22.7* 15.1* 28.0*  MCV 90.8 94.4 89.7  PLT 187 111* 174   Recent Labs    12/03/23 1647 12/04/23 0149  FOLATE  --  7.0  VITAMINB12 661  --   FERRITIN 2,157*  --   TIBC 255  --   IRONPCTSAT 48*  --    Recent Labs    12/03/23 0957 12/04/23 0412 12/05/23 0600  NA 136 134* 137  K 3.6 3.1* 3.2*  CL 101 100 104  CO2 19* 20* 24  GLUCOSE 240* 339* 210*  BUN 55* 40* 22  CREATININE 2.08* 1.54* 1.27*  CALCIUM  9.0 8.7* 8.5*   Recent Labs    12/03/23 0957 12/04/23 0412 12/05/23 0600  PROT 6.0* 5.7* 5.4*  ALBUMIN 3.3* 3.2* 2.9*  AST 164* 114* 60*  ALT  217* 266* 233*  ALKPHOS 47 52 49  BILITOT 1.5* 2.1* 1.8*      Imaging:  ECHOCARDIOGRAM COMPLETE    ECHOCARDIOGRAM REPORT       Patient Name:   John Douglas Date of Exam: 12/04/2023 Medical Rec #:  [id number]     Height:       70.0 in Accession #:    [id number]    Weight:       215.0 lb Date of Birth:  06-14-1955    BSA:          2.152 m Patient Age:    89 years      BP:           113/69 mmHg Patient Gender: M             HR:           111 bpm. Exam Location:  Inpatient  Procedure: 2D Echo, Cardiac Doppler, Color Doppler and Intracardiac            Opacification Agent (Both Spectral and Color Flow Doppler were            utilized during procedure).  Indications:    A fib   History:        Patient has prior history of  Echocardiogram examinations, most                 recent 03/04/2021. Arrythmias:Atrial Fibrillation.   Sonographer:    Benard Stallion Referring Phys: [id number] MARGARET E PRAY  IMPRESSIONS   1. Left ventricular ejection fraction, by estimation, is 45 to 50% with beat to beat variability. The left ventricle has mildly decreased function. The left ventricle demonstrates global hypokinesis. There is mild left ventricular hypertrophy. Left  ventricular diastolic parameters are indeterminate.  2. Right ventricular systolic function is normal. The right ventricular size is normal. Tricuspid regurgitation signal is inadequate for assessing PA pressure.  3. Left atrial size was moderately dilated.  4. The mitral valve is grossly normal. Trivial mitral valve regurgitation. No evidence of mitral stenosis.  5. The aortic valve is abnormal. There is moderate calcification of the aortic valve. Aortic valve regurgitation is not visualized. Aortic valve sclerosis/calcification is present, without any evidence of aortic stenosis.  6. The inferior vena cava is normal in size with greater than 50% respiratory variability, suggesting right atrial pressure of 3 mmHg.  FINDINGS  Left Ventricle: Left ventricular ejection fraction, by estimation, is 45 to 50%. The left ventricle has mildly decreased function. The left ventricle demonstrates global hypokinesis. Definity  contrast agent was given IV to delineate the left ventricular  endocardial borders. The left ventricular internal cavity size was normal in size. There is mild left ventricular hypertrophy. Left ventricular diastolic parameters are indeterminate.  Right Ventricle: The right ventricular size is normal. No increase in right ventricular wall thickness. Right ventricular systolic function is normal. Tricuspid regurgitation signal is inadequate for assessing PA pressure. The tricuspid regurgitant  velocity is 1.48 m/s, and with an assumed right atrial  pressure of 3 mmHg, the estimated right ventricular systolic pressure is 11.8 mmHg.  Left Atrium: Left atrial size was moderately dilated.  Right Atrium: Right atrial size was normal in size.  Pericardium: There is no evidence of pericardial effusion.  Mitral Valve: The mitral valve is grossly normal. Trivial mitral valve regurgitation. No evidence of mitral valve stenosis.  Tricuspid Valve: The tricuspid valve is normal in structure. Tricuspid valve regurgitation is trivial. No evidence of tricuspid stenosis.  Aortic Valve: The aortic valve is abnormal. There is moderate calcification of the aortic valve. Aortic valve regurgitation is not visualized. Aortic valve sclerosis/calcification is present, without any evidence of aortic stenosis. Aortic valve mean  gradient measures 5.0 mmHg. Aortic valve peak gradient measures 9.9 mmHg. Aortic valve area, by VTI measures 2.50 cm.  Pulmonic Valve: The pulmonic valve was normal in structure. Pulmonic valve regurgitation is trivial. No evidence of pulmonic stenosis.  Aorta: The aortic root is normal in size and structure.  Venous: The inferior vena cava is normal in size with greater than 50% respiratory variability, suggesting right atrial pressure of 3 mmHg.  IAS/Shunts: No atrial level shunt detected by color flow Doppler.    LEFT VENTRICLE PLAX 2D LVIDd:         5.20 cm LVIDs:         3.70 cm LV PW:         1.00 cm LV IVS:        1.00 cm LVOT diam:     2.30 cm LV SV:         61 LV SV Index:   29 LVOT Area:     4.15 cm    RIGHT VENTRICLE RV S prime:     10.90 cm/s TAPSE (M-mode): 2.1 cm  LEFT ATRIUM              Index        RIGHT ATRIUM           Index LA diam:        4.20 cm  1.95 cm/m   RA Area:     20.80 cm LA Vol (A2C):   168.0 ml 78.05 ml/m  RA Volume:   51.40 ml  23.88 ml/m LA Vol (A4C):   74.8 ml  34.75 ml/m LA Biplane Vol: 116.0 ml 53.89 ml/m  AORTIC VALVE AV Area (Vmax):    2.62 cm AV Area (Vmean):   2.63  cm AV Area (VTI):     2.50 cm AV Vmax:           157.00 cm/s AV Vmean:          106.000 cm/s AV VTI:            0.246 m AV Peak Grad:      9.9 mmHg AV Mean Grad:      5.0 mmHg LVOT Vmax:         99.10 cm/s LVOT Vmean:        67.100 cm/s LVOT VTI:          0.148 m LVOT/AV VTI ratio: 0.60   AORTA Ao Root diam: 3.30 cm  MITRAL VALVE                TRICUSPID VALVE MV Area (PHT): 4.96 cm     TR Peak grad:   8.8 mmHg MV Decel Time: 153 msec     TR Vmax:        148.00 cm/s MV E velocity: 142.00 cm/s                             SHUNTS                             Systemic VTI:  0.15 m  Systemic Diam: 2.30 cm  Soyla Merck MD Electronically signed by Soyla Merck MD Signature Date/Time: 12/04/2023/2:01:46 PM      Final       Scheduled inpatient medications:   sodium chloride    Intravenous Once   sodium chloride    Intravenous Once   Chlorhexidine  Gluconate Cloth  6 each Topical Daily   folic acid   2 mg Oral Daily   insulin  aspart  0-9 Units Subcutaneous TID WC   metoprolol  tartrate  25 mg Oral BID   Na Sulfate-K Sulfate-Mg Sulfate concentrate  0.5 kit Oral Once   pantoprazole  (PROTONIX ) IV  40 mg Intravenous Q12H   simethicone   240 mg Oral Once   sodium chloride  (PF)       sodium chloride  flush  10-40 mL Intracatheter Q12H   Continuous inpatient infusions:   sodium chloride  10 mL/hr at 12/04/23 1747   potassium chloride  10 mEq (12/05/23 0902)   PRN inpatient medications: mouth rinse, sodium chloride  (PF), sodium chloride  flush  Vital signs in last 24 hours: Temp:  [98 F (36.7 C)-98.8 F (37.1 C)] 98.3 F (36.8 C) (08/07 0712) Pulse Rate:  [92-128] 126 (08/07 0852) Resp:  [12-20] 12 (08/07 0712) BP: (92-137)/(72-86) 108/73 (08/07 0852) SpO2:  [92 %-100 %] 99 % (08/07 0712) Last BM Date : 12/04/23  Intake/Output Summary (Last 24 hours) at 12/05/2023 0921 Last data filed at 12/05/2023 0600 Gross per 24 hour  Intake 1199.5 ml  Output  300 ml  Net 899.5 ml    Intake/Output from previous day: 08/06 0701 - 08/07 0700 In: 1211.6 [P.O.:360; I.V.:209.3; Blood:642.3] Out: 300 [Urine:300] Intake/Output this shift: No intake/output data recorded.   Physical Exam:  General: Alert male in NAD Heart:  Regular rate and rhythm.  Pulmonary: Normal respiratory effort Abdomen: Soft, nondistended, nontender. Normal bowel sounds. Neurologic: Alert and oriented Psych: Pleasant. Cooperative     LOS: 2 days   Vina Dasen ,NP 12/05/2023, 9:21 AM

## 2023-12-05 NOTE — Progress Notes (Signed)
 ANTICOAGULATION CONSULT NOTE  Pharmacy Consult for Heparin  Indication: atrial fibrillation  Patient Measurements: Height: 5' 10 (177.8 cm) Weight: 97.5 kg (215 lb) IBW/kg (Calculated) : 73 Heparin  Dosing Weight: 93.1 kg  Vital Signs: Temp: 98.5 F (36.9 C) (08/07 1613) Temp Source: Oral (08/07 1613) BP: 156/119 (08/07 2029) Pulse Rate: 100 (08/07 1613)  Labs: Recent Labs    12/03/23 0957 12/03/23 0957 12/03/23 1157 12/03/23 1647 12/04/23 0149 12/04/23 0412 12/04/23 0921 12/04/23 1220 12/04/23 1600 12/05/23 0600 12/05/23 0916 12/05/23 1029 12/05/23 1916  HGB 3.6*  --   --  6.6* 7.8*  --    < >  --    < > 9.9* 6.0* 10.5* 10.5*  HCT 10.5*  --   --  18.9* 21.6*  --    < >  --    < > 28.0* 17.8* 29.9* 30.2*  PLT 253  --   --   --  179  --    < >  --    < > 174 112*  --  182  APTT  --    < >  --  43* 54*  --   --  66*  --   --   --   --  33  LABPROT  --   --   --  29.0*  --   --   --   --   --   --   --   --   --   INR  --   --   --  2.6*  --   --   --   --   --   --   --   --   --   HEPARINUNFRC  --   --   --   --  >1.10*  --   --   --   --   --   --   --   --   CREATININE 2.08*  --   --   --   --  1.54*  --   --   --  1.27*  --   --   --   TROPONINIHS 520*  --  498*  --   --   --   --   --   --   --   --   --   --    < > = values in this interval not displayed.    Estimated Creatinine Clearance: 64.3 mL/min (A) (by C-G formula based on SCr of 1.27 mg/dL (H)).  Assessment: 74 yom with a history of AF, CVA, colon cancer, CAD, HTN, HLD, ischemic cardiomyopathy, T2DM. Patient is presenting with AF. Heparin  per pharmacy consult placed for atrial fibrillation.  Patient being seen by GI for concern for GIB with low hemoglobin requiring blood transfusion. Patient is on apixaban  prior to arrival. Last dose 8/4 1800. Will require aPTT monitoring due to likely falsely high anti-Xa level secondary to DOAC use.  Discussed with Dr. Stoney, GI requesting bridge with heparin  and  hold apixaban . GI requests no missed anticoagulation citing high stroke risk and patient is worried. They are requesting additional transfusion and begin heparin . After discussing with Dr. Stoney will opt for lower goal levels until bleeding risk is lower.  8/7: Heparin  stopped overnight due to a drop in hgb-patient received multiple units of blood leading to a rapid increase in Hgb. This rapid increase raises concerns for myelosuppression d/t the patient's chemotherapy as opposed to a GIB, though will continue  to target lower end of goal. EGD and colonoscopy planned for tomorrow, per GI note. Will hold heparin  at 0400 tomorrow for colonoscopy.   8/7 pm - aPTT subtherapeutic (33 sec) on infusion at 1200 units/hr. No issues with line or bleeding reported per RN. Hgb stable past 12 hours 10.5.  Goal of Therapy:  Heparin  level 0.3-0.5 units/ml aPTT 66-85 seconds Monitor platelets by anticoagulation protocol: Yes   Plan:  Increase heparin  infusion to 1350 units/hr  Noted heparin  to be turned off at 0500 8/8 Will f/u post procedure on 8/8 for [facility] plans.  Thank you for allowing pharmacy to be a part of this patient's care    Vito Ralph, PharmD, BCPS Please see amion for complete clinical pharmacist phone list 12/05/2023 10:05 PM

## 2023-12-05 NOTE — Care Management Important Message (Signed)
 Important Message  Patient Details  Name: John Douglas MRN: [id number] Date of Birth: 1955/09/13   Important Message Given:  Yes - Medicare IM     Claretta Deed 12/05/2023, 4:00 PM

## 2023-12-05 NOTE — Assessment & Plan Note (Addendum)
 Continues to be asymptomatic. - cardiology consulted

## 2023-12-05 NOTE — Plan of Care (Signed)

## 2023-12-05 NOTE — Plan of Care (Signed)
  Problem: Education: Goal: Ability to describe self-care measures that may prevent or decrease complications (Diabetes Survival Skills Education) will improve Outcome: Progressing   Problem: Coping: Goal: Ability to adjust to condition or change in health will improve Outcome: Progressing   Problem: Health Behavior/Discharge Planning: Goal: Ability to manage health-related needs will improve Outcome: Progressing   Problem: Metabolic: Goal: Ability to maintain appropriate glucose levels will improve Outcome: Progressing   Problem: Tissue Perfusion: Goal: Adequacy of tissue perfusion will improve Outcome: Progressing   Problem: Health Behavior/Discharge Planning: Goal: Ability to manage health-related needs will improve Outcome: Progressing   Problem: Clinical Measurements: Goal: Ability to maintain clinical measurements within normal limits will improve Outcome: Progressing

## 2023-12-06 ENCOUNTER — Encounter (HOSPITAL_COMMUNITY)
Admission: EM | Disposition: A | Discharge status: Home / Self Care | Payer: Self-pay | Source: Home / Self Care | Attending: Family Medicine

## 2023-12-06 ENCOUNTER — Encounter (HOSPITAL_COMMUNITY): Payer: Self-pay

## 2023-12-06 ENCOUNTER — Inpatient Hospital Stay (HOSPITAL_COMMUNITY): Payer: Self-pay | Admitting: Anesthesiology

## 2023-12-06 DIAGNOSIS — K3189 Other diseases of stomach and duodenum: Secondary | ICD-10-CM | POA: Diagnosis not present

## 2023-12-06 DIAGNOSIS — K56699 Other intestinal obstruction unspecified as to partial versus complete obstruction: Secondary | ICD-10-CM | POA: Diagnosis not present

## 2023-12-06 DIAGNOSIS — I2510 Atherosclerotic heart disease of native coronary artery without angina pectoris: Secondary | ICD-10-CM

## 2023-12-06 DIAGNOSIS — I4819 Other persistent atrial fibrillation: Secondary | ICD-10-CM

## 2023-12-06 DIAGNOSIS — K648 Other hemorrhoids: Secondary | ICD-10-CM

## 2023-12-06 DIAGNOSIS — I10 Essential (primary) hypertension: Secondary | ICD-10-CM

## 2023-12-06 DIAGNOSIS — K6389 Other specified diseases of intestine: Secondary | ICD-10-CM

## 2023-12-06 DIAGNOSIS — K624 Stenosis of anus and rectum: Secondary | ICD-10-CM

## 2023-12-06 DIAGNOSIS — R195 Other fecal abnormalities: Secondary | ICD-10-CM | POA: Diagnosis not present

## 2023-12-06 DIAGNOSIS — K635 Polyp of colon: Secondary | ICD-10-CM

## 2023-12-06 DIAGNOSIS — D123 Benign neoplasm of transverse colon: Secondary | ICD-10-CM

## 2023-12-06 DIAGNOSIS — K269 Duodenal ulcer, unspecified as acute or chronic, without hemorrhage or perforation: Secondary | ICD-10-CM

## 2023-12-06 DIAGNOSIS — Z85048 Personal history of other malignant neoplasm of rectum, rectosigmoid junction, and anus: Secondary | ICD-10-CM

## 2023-12-06 DIAGNOSIS — K259 Gastric ulcer, unspecified as acute or chronic, without hemorrhage or perforation: Secondary | ICD-10-CM

## 2023-12-06 DIAGNOSIS — R7989 Other specified abnormal findings of blood chemistry: Secondary | ICD-10-CM | POA: Diagnosis not present

## 2023-12-06 DIAGNOSIS — K5730 Diverticulosis of large intestine without perforation or abscess without bleeding: Secondary | ICD-10-CM

## 2023-12-06 DIAGNOSIS — D649 Anemia, unspecified: Secondary | ICD-10-CM | POA: Diagnosis not present

## 2023-12-06 DIAGNOSIS — D509 Iron deficiency anemia, unspecified: Secondary | ICD-10-CM

## 2023-12-06 DIAGNOSIS — E782 Mixed hyperlipidemia: Secondary | ICD-10-CM

## 2023-12-06 HISTORY — PX: ESOPHAGOGASTRODUODENOSCOPY: SHX5428

## 2023-12-06 HISTORY — PX: COLONOSCOPY: SHX5424

## 2023-12-06 LAB — COMPREHENSIVE METABOLIC PANEL WITH GFR
ALT: 161 U/L — ABNORMAL HIGH (ref 0–44)
AST: 29 U/L (ref 15–41)
Albumin: 3 g/dL — ABNORMAL LOW (ref 3.5–5.0)
Alkaline Phosphatase: 43 U/L (ref 38–126)
Anion gap: 8 (ref 5–15)
BUN: 17 mg/dL (ref 8–23)
CO2: 23 mmol/L (ref 22–32)
Calcium: 8.7 mg/dL — ABNORMAL LOW (ref 8.9–10.3)
Chloride: 108 mmol/L (ref 98–111)
Creatinine, Ser: 1.25 mg/dL — ABNORMAL HIGH (ref 0.61–1.24)
GFR, Estimated: >60 mL/min (ref >60)
Glucose, Bld: 157 mg/dL — ABNORMAL HIGH (ref 70–99)
Potassium: 3.7 mmol/L (ref 3.5–5.1)
Sodium: 139 mmol/L (ref 135–145)
Total Bilirubin: 1.4 mg/dL — ABNORMAL HIGH (ref 0.0–1.2)
Total Protein: 5.7 g/dL — ABNORMAL LOW (ref 6.5–8.1)

## 2023-12-06 LAB — CBC
HCT: 29.1 % — ABNORMAL LOW (ref 39.0–52.0)
HCT: 29.1 % — ABNORMAL LOW (ref 39.0–52.0)
HCT: 30.1 % — ABNORMAL LOW (ref 39.0–52.0)
Hemoglobin: 9.8 g/dL — ABNORMAL LOW (ref 13.0–17.0)
Hemoglobin: 9.9 g/dL — ABNORMAL LOW (ref 13.0–17.0)
Hemoglobin: 10.1 g/dL — ABNORMAL LOW (ref 13.0–17.0)
MCH: 31.4 pg (ref 26.0–34.0)
MCH: 31.7 pg (ref 26.0–34.0)
MCH: 31.3 pg (ref 26.0–34.0)
MCHC: 33.7 g/dL (ref 30.0–36.0)
MCHC: 34 g/dL (ref 30.0–36.0)
MCHC: 33.6 g/dL (ref 30.0–36.0)
MCV: 93.3 fL (ref 80.0–100.0)
MCV: 93.3 fL (ref 80.0–100.0)
MCV: 93.2 fL (ref 80.0–100.0)
Platelets: 153 K/uL (ref 150–400)
Platelets: 153 K/uL (ref 150–400)
Platelets: 138 K/uL — ABNORMAL LOW (ref 150–400)
RBC: 3.12 MIL/uL — ABNORMAL LOW (ref 4.22–5.81)
RBC: 3.12 MIL/uL — ABNORMAL LOW (ref 4.22–5.81)
RBC: 3.23 MIL/uL — ABNORMAL LOW (ref 4.22–5.81)
RDW: 17.8 % — ABNORMAL HIGH (ref 11.5–15.5)
RDW: 18.1 % — ABNORMAL HIGH (ref 11.5–15.5)
RDW: 18.2 % — ABNORMAL HIGH (ref 11.5–15.5)
WBC: 2.8 K/uL — ABNORMAL LOW (ref 4.0–10.5)
WBC: 2.5 K/uL — ABNORMAL LOW (ref 4.0–10.5)
WBC: 2.9 K/uL — ABNORMAL LOW (ref 4.0–10.5)
nRBC: 3.9 % — ABNORMAL HIGH (ref 0.0–0.2)
nRBC: 2.8 % — ABNORMAL HIGH (ref 0.0–0.2)
nRBC: 3.5 % — ABNORMAL HIGH (ref 0.0–0.2)

## 2023-12-06 LAB — MAGNESIUM: Magnesium: 2.1 mg/dL (ref 1.7–2.4)

## 2023-12-06 LAB — BPAM RBC
Blood Product Expiration Date: 202508302359
Blood Product Expiration Date: 202508302359
Blood Product Expiration Date: 202508302359
Blood Product Expiration Date: 202508302359
Blood Product Expiration Date: 202508312359
Blood Product Expiration Date: 202508312359
ISSUE DATE / TIME: 202508051149
ISSUE DATE / TIME: 202508051353
ISSUE DATE / TIME: 202508051513
ISSUE DATE / TIME: 202508052044
ISSUE DATE / TIME: 202508062302
ISSUE DATE / TIME: 202508070141
Unit Type and Rh: 5100
Unit Type and Rh: 5100
Unit Type and Rh: 5100
Unit Type and Rh: 5100
Unit Type and Rh: 5100
Unit Type and Rh: 5100

## 2023-12-06 LAB — TYPE AND SCREEN
ABO/RH(D): O POS
Antibody Screen: NEGATIVE
Unit division: 0
Unit division: 0
Unit division: 0
Unit division: 0
Unit division: 0
Unit division: 0

## 2023-12-06 LAB — GLUCOSE, CAPILLARY
Glucose-Capillary: 151 mg/dL — ABNORMAL HIGH (ref 70–99)
Glucose-Capillary: 136 mg/dL — ABNORMAL HIGH (ref 70–99)
Glucose-Capillary: 142 mg/dL — ABNORMAL HIGH (ref 70–99)
Glucose-Capillary: 165 mg/dL — ABNORMAL HIGH (ref 70–99)
Glucose-Capillary: 159 mg/dL — ABNORMAL HIGH (ref 70–99)

## 2023-12-06 SURGERY — EGD (ESOPHAGOGASTRODUODENOSCOPY)
Anesthesia: Monitor Anesthesia Care

## 2023-12-06 MED ORDER — METOPROLOL TARTRATE 50 MG PO TABS
50.0000 mg | ORAL_TABLET | Freq: Two times a day (BID) | ORAL | Status: DC
  Administered 2023-12-06 – 2023-12-07 (×2): 50 mg via ORAL
  Filled 2023-12-06 (×2): qty 1

## 2023-12-06 MED ORDER — PANTOPRAZOLE SODIUM 40 MG PO TBEC
40.0000 mg | DELAYED_RELEASE_TABLET | Freq: Every day | ORAL | Status: DC
  Administered 2023-12-07: 40 mg via ORAL
  Filled 2023-12-06: qty 1

## 2023-12-06 MED ORDER — APIXABAN 5 MG PO TABS
5.0000 mg | ORAL_TABLET | Freq: Two times a day (BID) | ORAL | Status: DC
  Administered 2023-12-07: 5 mg via ORAL
  Filled 2023-12-06: qty 1

## 2023-12-06 MED ORDER — PROPOFOL 10 MG/ML IV BOLUS
INTRAVENOUS | Status: DC | PRN
  Administered 2023-12-06: 20 mg via INTRAVENOUS
  Administered 2023-12-06: 10 mg via INTRAVENOUS
  Administered 2023-12-06: 40 mg via INTRAVENOUS
  Administered 2023-12-06: 100 ug/kg/min via INTRAVENOUS

## 2023-12-06 MED ORDER — METOPROLOL TARTRATE 5 MG/5ML IV SOLN
INTRAVENOUS | Status: AC
Start: 2023-12-06 — End: 2023-12-06
  Filled 2023-12-06: qty 5

## 2023-12-06 MED ORDER — SODIUM CHLORIDE 0.9 % IV SOLN
INTRAVENOUS | Status: DC | PRN
Start: 2023-12-06 — End: 2023-12-06

## 2023-12-06 MED ORDER — ACETAMINOPHEN 325 MG PO TABS
650.0000 mg | ORAL_TABLET | Freq: Four times a day (QID) | ORAL | Status: DC | PRN
  Administered 2023-12-06: 650 mg via ORAL
  Filled 2023-12-06: qty 2

## 2023-12-06 MED ORDER — PHENYLEPHRINE 80 MCG/ML (10ML) SYRINGE FOR IV PUSH (FOR BLOOD PRESSURE SUPPORT)
PREFILLED_SYRINGE | INTRAVENOUS | Status: DC | PRN
Start: 2023-12-06 — End: 2023-12-06
  Administered 2023-12-06: 80 ug via INTRAVENOUS
  Administered 2023-12-06: 160 ug via INTRAVENOUS

## 2023-12-06 MED ORDER — LIDOCAINE 2% (20 MG/ML) 5 ML SYRINGE
INTRAMUSCULAR | Status: DC | PRN
  Administered 2023-12-06: 100 mg via INTRAVENOUS

## 2023-12-06 NOTE — Progress Notes (Signed)
 Heart Failure Navigator Progress Note  Assessed for Heart & Vascular TOC clinic readiness.  Patient does not meet criteria due to has a scheduled [facility] appointment on 12/25/2023. No HF TOC . SABRA   Navigator  will sign off at this time.   Stephane Haddock, BSN, Scientist, clinical (histocompatibility and immunogenetics) Only

## 2023-12-06 NOTE — Assessment & Plan Note (Signed)
 AST/ALT improving. Suspect ischemia.  - AM CMP

## 2023-12-06 NOTE — Anesthesia Postprocedure Evaluation (Signed)
 Anesthesia Post Note  Patient: John Douglas  Procedure(s) Performed: EGD (ESOPHAGOGASTRODUODENOSCOPY) COLONOSCOPY     Patient location during evaluation: PACU Anesthesia Type: MAC Level of consciousness: awake and alert Pain management: pain level controlled Vital Signs Assessment: post-procedure vital signs reviewed and stable Respiratory status: spontaneous breathing, nonlabored ventilation and respiratory function stable Cardiovascular status: blood pressure returned to baseline Postop Assessment: no apparent nausea or vomiting Anesthetic complications: no   There were no known notable events for this encounter.  Last Vitals:  Vitals:   12/06/23 1004 12/06/23 1020  BP: 105/76 (!) 122/98  Pulse: 96 (!) 116  Resp: 19 18  Temp: (!) 36.1 C   SpO2: 99% 95%    Last Pain:  Vitals:   12/06/23 1020  TempSrc:   PainSc: 0-No pain                 Vertell Row

## 2023-12-06 NOTE — Transfer of Care (Signed)
 Immediate Anesthesia Transfer of Care Note  Patient: John Douglas  Procedure(s) Performed: EGD (ESOPHAGOGASTRODUODENOSCOPY) COLONOSCOPY  Patient Location: PACU and Endoscopy Unit  Anesthesia Type:MAC  Level of Consciousness: awake, alert , and oriented  Airway & Oxygen Therapy: Patient Spontanous Breathing  Post-op Assessment: Report given to RN and Post -op Vital signs reviewed and stable  Post vital signs: Reviewed and stable  Last Vitals:  Vitals Value Taken Time  BP 105/76 12/06/23 10:04  Temp 36.1 C 12/06/23 10:04  Pulse 96 12/06/23 10:04  Resp 19 12/06/23 10:04  SpO2 99 % 12/06/23 10:04    Last Pain:  Vitals:   12/06/23 1004  TempSrc: Temporal  PainSc: Asleep      Patients Stated Pain Goal: 0 (12/05/23 0820)  Complications: There were no known notable events for this encounter.

## 2023-12-06 NOTE — Assessment & Plan Note (Addendum)
 Hgb currently stable. - GI following - EGD and colonoscopy today - oncology following - CBC q8h  - Protonix  40 mg once daily

## 2023-12-06 NOTE — Progress Notes (Signed)
 Looks like he will have the endoscopy today.  He feels okay.  He is off anticoagulation right now.  He has been on heparin  because of the atrial fibrillation.  His labs show a white cell count  of 2.8.  Hemoglobin 9.8.  Platelet count 153,000.  The SGPT is now 161 SGOT 29.  Bilirubin is 1.4.  BUN 17 creatinine 1.25.  Blood sugar 157.  Again, I have to believe that he is having some bleeding.  I think part of the problem is that with the oral chemotherapy that he was on, this can certainly lead to marrow suppression.  I suspect that the white cell count decrease is probably reflective of the L[place]urf .  His LFTs are improving which is nice to see.  This concern would be from his diabetes.  I am not sure that the L[place]urf  really affects hepatic function all that much.  Again, we really need to see what the endoscopy has to show.    He still has not eating.  Hopefully he will be able to eat once he has his procedure done.  His vital signs are all stable.  Temperature 98.3.  Pulse 100.  Blood pressure 158/101.  His lungs sound clear bilaterally.  Cardiac exam tachycardic and irregular consistent with the atrial fibrillation.   Abdomen is soft.  Bowel sounds are present.  There is no fluid wave.  There is no palpable liver or spleen tip.   I do appreciate everybody's help with Mr. Lancon.  I know this is complicated.  Hopefully, we will get some results with the upper and lower endoscopy.   Jeralyn Crease, MD  [facility]alm [phone]

## 2023-12-06 NOTE — Op Note (Signed)
 [facility] Patient Name: John Douglas Procedure Date : 12/06/2023 MRN: [id number] Attending MD: Gordy CHRISTELLA Starch , MD, [id number] Date of Birth: 1955-05-04 CSN: [id number] Age: 68 Admit Type: Inpatient Procedure:                Upper GI endoscopy Indications:              Iron deficiency anemia secondary to chronic blood                            loss, Heme positive stool Providers:                Gordy CHRISTELLA. Starch, MD, Darleene Bare, RN, Felice Sar,                            Technician Referring MD:             [facility] Medicines:                Monitored Anesthesia Care Complications:            No immediate complications. Estimated Blood Loss:     Estimated blood loss was minimal. Procedure:                Pre-Anesthesia Assessment:                           - Prior to the procedure, a History and Physical                            was performed, and patient medications and                            allergies were reviewed. The patient's tolerance of                            previous anesthesia was also reviewed. The risks                            and benefits of the procedure and the sedation                            options and risks were discussed with the patient.                            All questions were answered, and informed consent                            was obtained. Prior Anticoagulants: The patient has                            taken heparin , last dose was 1 day prior to                            procedure. ASA Grade Assessment: III - A patient  with severe systemic disease. After reviewing the                            risks and benefits, the patient was deemed in                            satisfactory condition to undergo the procedure.                           After obtaining informed consent, the endoscope was                            passed under direct vision. Throughout the                             procedure, the patient's blood pressure, pulse, and                            oxygen saturations were monitored continuously. The                            [id number]-H190 ([id number]) Olympus endoscope was introduced                            through the mouth, and advanced to the second part                            of duodenum. The upper GI endoscopy was                            accomplished without difficulty. The patient                            tolerated the procedure well. Scope In: Scope Out: Findings:      The examined esophagus was normal.      A few diminutive erosions with no bleeding and no stigmata of recent       bleeding were found in the prepyloric region of the stomach. Biopsies       were taken with a cold forceps for Helicobacter pylori testing.      The exam of the stomach was otherwise normal.      A single erosion without bleeding was found in the duodenal bulb.      The second portion of the duodenum was normal. Impression:               - Normal esophagus.                           - A few scattered erosions in the prepyloric antrum                            with no bleeding and no stigmata of recent                            bleeding. Biopsied to  exclude H. Pylori.                           - Duodenal erosion (single) without bleeding in the                            bulb.                           - Normal second portion of the duodenum. Moderate Sedation:      N/A Recommendation:           - Patient has a contact number available for                            emergencies. The signs and symptoms of potential                            delayed complications were discussed with the                            patient. Return to normal activities tomorrow.                            Written discharge instructions were provided to the                            patient.                           - Resume previous diet.                           -  Continue present medications.                           - Once daily PPI is recommended.                           - Await pathology results.                           - See the other procedure note for documentation of                            additional recommendations. Procedure Code(s):        --- Professional ---                           [phone], Esophagogastroduodenoscopy, flexible,                            transoral; with biopsy, single or multiple Diagnosis Code(s):        --- Professional ---                           K31.89, Other diseases of stomach and duodenum  K26.9, Duodenal ulcer, unspecified as acute or                            chronic, without hemorrhage or perforation                           D50.0, Iron deficiency anemia secondary to blood                            loss (chronic)                           R19.5, Other fecal abnormalities CPT copyright 2022 American [employer] Association. All rights reserved. The codes documented in this report are preliminary and upon coder review may  be revised to meet current compliance requirements. Gordy CHRISTELLA Starch, MD 12/06/2023 10:07:59 AM This report has been signed electronically. Number of Addenda: 0

## 2023-12-06 NOTE — Progress Notes (Signed)
 Patient in endoscopy during morning rounds.   John Monrroy [facility], DO, FACC

## 2023-12-06 NOTE — Assessment & Plan Note (Addendum)
 Rate appears more poorly controlled yesterday. 8 runs of RVR overnight. Anemia should no longer be a cause of tachycardia at this point. His BP has been elevated for the last day, and I think he could tolerate more aggressive rate control at this time.  - cardiology following - if cards does not drop a note today, I will reach out to them for recs on echo results and rate control - Lopressor  25 mg BID, potentially transition to Toprol  if cards recommends - heparin  gtt currently held for scopes - consider restaring his home eliquis  after scope if no evidence of acute bleed

## 2023-12-06 NOTE — Interval H&P Note (Signed)
 History and Physical Interval Note: EGD/colon this AM Completed prep Heparin  on hold HIGHER THAN BASELINE RISK.The nature of the procedure, as well as the risks, benefits, and alternatives were carefully and thoroughly reviewed with the patient. Ample time for discussion and questions allowed. The patient understood, was satisfied, and agreed to proceed.      Latest Ref Rng & Units 12/06/2023    4:00 AM 12/05/2023    7:16 PM 12/05/2023   10:29 AM  CBC  WBC 4.0 - 10.5 K/uL 2.8  3.9    Hemoglobin 13.0 - 17.0 g/dL 9.8  89.4  89.4   Hematocrit 39.0 - 52.0 % 29.1  30.2  29.9   Platelets 150 - 400 K/uL 153  182      12/06/2023 9:00 AM  John Douglas  has presented today for surgery, with the diagnosis of Anemia, history of rectal cancer..  The various methods of treatment have been discussed with the patient and family. After consideration of risks, benefits and other options for treatment, the patient has consented to  Procedure(s): EGD (ESOPHAGOGASTRODUODENOSCOPY) (N/A) COLONOSCOPY (N/A) as a surgical intervention.  The patient's history has been reviewed, patient examined, no change in status, stable for surgery.  I have reviewed the patient's chart and labs.  Questions were answered to the patient's satisfaction.     Gordy HERO Kyiesha Millward

## 2023-12-06 NOTE — Plan of Care (Signed)
  Problem: Education: Goal: Ability to describe self-care measures that may prevent or decrease complications (Diabetes Survival Skills Education) will improve Outcome: Progressing   Problem: Coping: Goal: Ability to adjust to condition or change in health will improve Outcome: Progressing   Problem: Fluid Volume: Goal: Ability to maintain a balanced intake and output will improve Outcome: Progressing   Problem: Education: Goal: Knowledge of General Education information will improve Description: Including pain rating scale, medication(s)/side effects and non-pharmacologic comfort measures Outcome: Progressing   Problem: Health Behavior/Discharge Planning: Goal: Ability to manage health-related needs will improve Outcome: Progressing   Problem: Nutrition: Goal: Adequate nutrition will be maintained Outcome: Progressing   Problem: Pain Managment: Goal: General experience of comfort will improve and/or be controlled Outcome: Progressing

## 2023-12-06 NOTE — Op Note (Signed)
 [facility] Patient Name: John Douglas Procedure Date : 12/06/2023 MRN: [id number] Attending MD: Gordy CHRISTELLA Starch , MD, [id number] Date of Birth: Nov 23, 1955 CSN: [id number] Age: 68 Admit Type: Inpatient Procedure:                Colonoscopy Indications:              Heme positive stool, Iron deficiency anemia                            secondary to chronic blood loss, Personal history                            of malignant rectal neoplasm dx 2021 treated with                            radiation and chemotherapy without surgery Providers:                Gordy CHRISTELLA. Starch, MD, Darleene Bare, RN, Felice Sar,                            Technician Referring MD:             [facility] Medicines:                Monitored Anesthesia Care Complications:            No immediate complications. Estimated Blood Loss:     Estimated blood loss was minimal. Procedure:                Pre-Anesthesia Assessment:                           - Prior to the procedure, a History and Physical                            was performed, and patient medications and                            allergies were reviewed. The patient's tolerance of                            previous anesthesia was also reviewed. The risks                            and benefits of the procedure and the sedation                            options and risks were discussed with the patient.                            All questions were answered, and informed consent                            was obtained. Prior Anticoagulants: The patient has  taken heparin , last dose was 1 day prior to                            procedure. ASA Grade Assessment: III - A patient                            with severe systemic disease. After reviewing the                            risks and benefits, the patient was deemed in                            satisfactory condition to undergo the procedure.                            After obtaining informed consent, the colonoscope                            was passed under direct vision. Throughout the                            procedure, the patient's blood pressure, pulse, and                            oxygen saturations were monitored continuously. The                            PCF-H190TL ([id number]) Olympus colonoscope was                            introduced through the anus and advanced to the                            terminal ileum. The colonoscopy was performed                            without difficulty. The patient tolerated the                            procedure well. The quality of the bowel                            preparation was adequate. The terminal ileum,                            ileocecal valve, appendiceal orifice, and rectum                            were photographed. Scope In: 9:34:28 AM Scope Out: 9:57:06 AM Scope Withdrawal Time: 0 hours 17 minutes 30 seconds  Total Procedure Duration: 0 hours 22 minutes 38 seconds  Findings:      Skin tags were found on perianal exam.      The terminal ileum appeared normal.      A 5  mm polyp was found in the transverse colon. The polyp was sessile.       The polyp was removed with a cold snare. Resection and retrieval were       complete.      A benign-appearing, intrinsic moderate stenosis measuring 3 cm (in       length) x 8 mm (inner diameter) was found in the proximal rectum and       rectosigmoid colon and was traversed. Mucosal tattoos are visible       proximal to and distal to the stricture. There is nodular mucosa at the       stricture and this area was biopsied. Biopsies were taken with a cold       forceps for histology. No definite evidence of persistent tumor.      A few medium-mouthed and small-mouthed diverticula were found in the       sigmoid colon.      Internal hemorrhoids were found during retroflexion. The hemorrhoids       were  small. Impression:               - Perianal skin tags found on perianal exam.                           - The examined portion of the ileum was normal.                           - One 5 mm polyp in the transverse colon, removed                            with a cold snare. Resected and retrieved.                           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 Tattoo proximal and distal to the                            stricture. Nodular mucosa at level of stenosis                            without obvious malignancy. Biopsied.                           - Mild diverticulosis in the sigmoid colon.                           - Internal hemorrhoids. Moderate Sedation:      N/A Recommendation:           - Return patient to hospital ward for ongoing care.                           - Advance diet as tolerated.                           - Continue present medications.                           -  Resume Eliquis  (apixaban ) at prior dose tomorrow.                            Refer to managing physician for further adjustment                            of therapy.                           - Await pathology results.                           - No clear source of GI blood loss, though I will                            followup gastric biopsies and colonic biopsies.                            Patient will followup with Dr. Timmy who can                            closely monitor Hgb and iron stores.                           - Repeat colonoscopy is recommended. The                            colonoscopy date will be determined after pathology                            results from today's exam become available for                            review. Procedure Code(s):        --- Professional ---                           [phone], Colonoscopy, flexible; with removal of                            tumor(s), polyp(s), or other lesion(s) by snare                             technique                           45380, 59, Colonoscopy, flexible; with biopsy,                            single or multiple Diagnosis Code(s):        --- Professional ---                           K64.8, Other hemorrhoids  D12.3, Benign neoplasm of transverse colon (hepatic                            flexure or splenic flexure)                           K62.4, Stenosis of anus and rectum                           K64.4, Residual hemorrhoidal skin tags                           R19.5, Other fecal abnormalities                           D50.0, Iron deficiency anemia secondary to blood                            loss (chronic)                           Z85.048, Personal history of other malignant                            neoplasm of rectum, rectosigmoid junction, and anus                           K57.30, Diverticulosis of large intestine without                            perforation or abscess without bleeding CPT copyright 2022 American Medical Association. All rights reserved. The codes documented in this report are preliminary and upon coder review may  be revised to meet current compliance requirements. Gordy CHRISTELLA Starch, MD 12/06/2023 10:19:20 AM This report has been signed electronically. Number of Addenda: 0

## 2023-12-06 NOTE — Progress Notes (Addendum)
    Daily Progress Note Intern Pager: [phone]  Patient name: Shine Mikes Medical record number: [id number] Date of birth: Sep 01, 1955 Age: 68 y.o. Gender: male  Primary Care Provider: Stephanie Charlene CROME, MD Consultants: GI, oncology, cardiology Code Status: full   Pt Overview and Major Events to Date:  12/03/23 - admitted 12/03/23 - 4 pRBC 12/04/23 - 2 pRBC  Assessment and Plan:  68 yo male admitted with symptomatic anemia concerning for a GI bleed; however, his hgb has stabilized now s/p pRBC. Pertinent PMH/PSH includes rectal cancer on chemo, afib, DM, HLD. GI to scope today, hopefully.   Assessment & Plan Symptomatic anemia Hgb currently stable. - GI following - EGD and colonoscopy today - oncology following - CBC q8h  - Protonix  40 mg once daily Atrial fibrillation with RVR (HCC) Rate appears more poorly controlled yesterday. 8 runs of RVR overnight. Anemia should no longer be a cause of tachycardia at this point. His BP has been elevated for the last day, and I think he could tolerate more aggressive rate control at this time.  - cardiology following - if cards does not drop a note today, I will reach out to them for recs on echo results and rate control - Lopressor  25 mg BID, potentially transition to Toprol  if cards recommends - heparin  gtt currently held for scopes - consider restaring his home eliquis  after scope if no evidence of acute bleed Atherosclerosis of native coronary artery of native heart without angina pectoris Continues to be asymptomatic. - cardiology following AKI (acute kidney injury) (HCC) Cr improving. Baseline ~1 - AM CMP Transaminitis AST/ALT improving. Suspect ischemia.  - AM CMP Chronic health problem Diabetes: A1C 6.0, BG 151-225, sensitive sliding scale insulin  ordered HLD: holding zetia  until LFT improve.  FEN/GI: NPO PPx: heparin  currently held Dispo:Home pending clinical improvement .   Subjective:  He has no CP, SOB, abd pain. Fatigue  improved from admission.  Objective: Temp:  [98.1 F (36.7 C)-98.5 F (36.9 C)] 98.3 F (36.8 C) (08/08 0403) Pulse Rate:  [93-126] 100 (08/07 1613) Resp:  [14-20] 18 (08/08 0403) BP: (90-158)/(62-119) 158/101 (08/08 0403) SpO2:  [95 %-99 %] 96 % (08/08 0403) Physical Exam: General: well appearing man lying in bed awake Cardiovascular: tachycardic, regular rhythm Respiratory: CTAB Abdomen: soft, nontender Extremities: no peripheral edema  Laboratory: Most recent CBC Lab Results  Component Value Date   WBC 2.8 (L) 12/06/2023   HGB 9.8 (L) 12/06/2023   HCT 29.1 (L) 12/06/2023   MCV 93.3 12/06/2023   PLT 153 12/06/2023   Most recent BMP    Latest Ref Rng & Units 12/06/2023    4:00 AM  BMP  Glucose 70 - 99 mg/dL 842   BUN 8 - 23 mg/dL 17   Creatinine 9.38 - 1.24 mg/dL 8.74   Sodium 864 - 854 mmol/L 139   Potassium 3.5 - 5.1 mmol/L 3.7   Chloride 98 - 111 mmol/L 108   CO2 22 - 32 mmol/L 23   Calcium  8.9 - 10.3 mg/dL 8.7    Mg 2.1  Imaging/Diagnostic Tests: None  Alena Morrison, Elio, MD 12/06/2023, 7:12 AM  PGY-1, [facility] FPTS Intern pager: [phone], text pages welcome Secure chat group [facility] Teaching Service

## 2023-12-06 NOTE — Assessment & Plan Note (Signed)
 Cr improving. Baseline ~1 - AM CMP

## 2023-12-06 NOTE — Assessment & Plan Note (Signed)
 Diabetes: A1C 6.0, BG 151-225, sensitive sliding scale insulin  ordered HLD: holding zetia  until LFT improve.

## 2023-12-06 NOTE — Progress Notes (Addendum)
 Rounding Note   Patient Name: John Douglas Date of Encounter: 12/06/2023  [facility] Cardiologist: Lonni Hanson, MD   Subjective Underwent endoscopy earlier this morning. Younger daughter is present at bedside. Patient denies anginal chest pain or heart failure symptoms. Rates are well-controlled at rest but elevated with exertion  Scheduled Meds:  sodium chloride    Intravenous Once   sodium chloride    Intravenous Once   [START ON 12/07/2023] apixaban   5 mg Oral BID   Chlorhexidine  Gluconate Cloth  6 each Topical Daily   folic acid   2 mg Oral Daily   insulin  aspart  0-9 Units Subcutaneous TID WC   metoprolol  tartrate  50 mg Oral BID   Na Sulfate-K Sulfate-Mg Sulfate concentrate  0.5 kit Oral Once   [START ON 12/07/2023] pantoprazole   40 mg Oral QAC breakfast   simethicone   240 mg Oral Once   sodium chloride  flush  10-40 mL Intracatheter Q12H   Continuous Infusions:   PRN Meds: mouth rinse, sodium chloride  flush   Vital Signs  Vitals:   12/06/23 1040 12/06/23 1101 12/06/23 1212 12/06/23 1603  BP: (!) 129/96 (!) 155/105 (!) 119/91 (!) 122/94  Pulse: (!) 118 (!) 110 (!) 102 87  Resp: 18 18 15 19   Temp:  98.7 F (37.1 C)  98.4 F (36.9 C)  TempSrc:  Oral  Oral  SpO2: 96% 96% 95% 96%  Weight:      Height:        Intake/Output Summary (Last 24 hours) at 12/06/2023 1744 Last data filed at 12/06/2023 1300 Gross per 24 hour  Intake 1817.89 ml  Output --  Net 1817.89 ml      12/03/2023    9:46 AM 10/24/2023    1:00 PM 09/20/2023    3:00 PM  Last 3 Weights  Weight (lbs) 215 lb 215 lb 215 lb  Weight (kg) 97.523 kg 97.523 kg 97.523 kg      Telemetry Atrial fibrillation with rates mostly in the 100s-110s. Brief episodes in the 130s-140s.  - Personally Reviewed  ECG  No new tracings- Personally Reviewed  Physical Exam  GEN: No acute distress.   Neck: No JVD. Cardiac: Tachycardic with irregularly irregular rhythm. No murmurs, rubs, or gallops.   Respiratory: Clear to auscultation bilaterally. No wheezes, rhonchi, or rales.  MS: No lower extremity edema. No deformity. Skin: Warm and dry. Neuro:  No focal deficits.  1. Psych: Normal affect.  Labs High Sensitivity Troponin:   Recent Labs  Lab 12/03/23 0957 12/03/23 1157  TROPONINIHS 520* 498*     Chemistry Recent Labs  Lab 12/04/23 0412 12/04/23 0921 12/05/23 0600 12/05/23 0800 12/05/23 0916 12/06/23 0400  NA 134*  --  137  --   --  139  K 3.1*  --  3.2*  --   --  3.7  CL 100  --  104  --   --  108  CO2 20*  --  24  --   --  23  GLUCOSE 339*  --  210*  --   --  157*  BUN 40*  --  22  --   --  17  CREATININE 1.54*  --  1.27*  --   --  1.25*  CALCIUM  8.7*  --  8.5*  --   --  8.7*  MG  --  2.2  --   --  2.2 2.1  PROT 5.7*  --  5.4*  --   --  5.7*  ALBUMIN 3.2*  --  2.9*  --   --  3.0*  AST 114*  --  60*  --   --  29  ALT 266*  --  233*  --   --  161*  ALKPHOS 52  --  49  --   --  43  BILITOT 2.1*  --  1.8* 1.5*  --  1.4*  GFRNONAA 49*  --  >60  --   --  >60  ANIONGAP 14  --  9  --   --  8    Lipids No results for input(s): CHOL, TRIG, HDL, LABVLDL, LDLCALC, CHOLHDL in the last 168 hours.  Hematology Recent Labs  Lab 12/05/23 1916 12/06/23 0400 12/06/23 1145  WBC 3.9* 2.8* 2.5*  RBC 3.31* 3.12* 3.12*  HGB 10.5* 9.8* 9.9*  HCT 30.2* 29.1* 29.1*  MCV 91.2 93.3 93.3  MCH 31.7 31.4 31.7  MCHC 34.8 33.7 34.0  RDW 17.2* 17.8* 18.1*  PLT 182 153 153     Radiology  No results found.   Cardiac Studies  12/04/2023  1. Left ventricular ejection fraction, by estimation, is 45 to 50% with  beat to beat variability. The left ventricle has mildly decreased  function. The left ventricle demonstrates global hypokinesis. There is  mild left ventricular hypertrophy. Left  ventricular diastolic parameters are indeterminate.   2. Right ventricular systolic function is normal. The right ventricular  size is normal. Tricuspid regurgitation signal is  inadequate for assessing  PA pressure.   3. Left atrial size was moderately dilated.   4. The mitral valve is grossly normal. Trivial mitral valve  regurgitation. No evidence of mitral stenosis.   5. The aortic valve is abnormal. There is moderate calcification of the  aortic valve. Aortic valve regurgitation is not visualized. Aortic valve  sclerosis/calcification is present, without any evidence of aortic  stenosis.   6. The inferior vena cava is normal in size with greater than 50%  respiratory variability, suggesting right atrial pressure of 3 mmHg.   Patient Profile   69 y.o. male with a history of CAD with STEMI in 03/2019 s/p DES to RCA and the DES to mid LAD in 06/2019, ischemic cardiomyopathy with normalization of EF on last Echo in 04/2020,  VT in at time of MI in 03/2019 in setting of residual left coronary artery disease, persistent atrial fibrillation on Eliquis , CVAin 09/2019, hypertension, hyperlipidemia, type 2 diabetes mellitus, and rectal cancer who was admitted on 12/03/2023 with severe symptomatic anemia with hemoglobin of 3.6 signs of multiorgan hypoperfusion with lactic acid of 6.0, AKI, and transaminitis. Cardiology consulted for evaluation of atrial fibrillation at the request of Dr. Donzetta.  Assessment & Plan   Persistent Atrial Fibrillation In the past refused to undergo cardioversion or antiarrhythmic medications for rhythm control  Has been on rate control strategy with with metoprolol  succinate and diltiazem . On thromboembolic prophylaxis with Eliquis . Presenting with hemoglobin of 3.6 g/dL. Has received multiple blood transfusions with labile hemoglobin levels. Despite severe symptomatic anemia patient requested being on IV heparin  prior to his endoscopy to minimize risk of stroke. Underwent endoscopy today and GI recommends resuming Eliquis  12/07/2023. Unsure if the drop in hemoglobin is predominantly organic disease versus him being on Lonsurf .  Had a long discussion  with the patient and his daughter at bedside regarding risk/benefit ratio for anticoagulation for thromboembolic prophylaxis.  Given the significant drop in hemoglobin to 3.6 g/dL favored not being on anticoagulation and being considered for left atrial appendage occluder device as  outpatient and to be cognizant of strokelike symptoms and if they arise to seek medical attention by going to the nearest hospital.  However, patient states that given his history of stroke he would rather be on anticoagulation and risk the chance of bleeding than have a stroke.  I have asked him to discuss the risks versus benefits of restarting blood thinner after this hospitalization with his family/next of kin prior to making an informed decision and ask questions to his care team if a need arises. In the meantime he will need to be monitored closely once discharged. Will increase metoprolol  from 25 mg p.o. twice daily to 50mg  daily. Repeat echocardiogram shows mildly reduced LVEF; therefore, optimize metoprolol . Patient states he will discuss Watchman Evaluation w/ his primary cardiologist Dr. Mady later this month. To expedite his care will send a staff message to his primary cardiologist Dr.End and structural team coordinator to expedite his evaluation for possible Watchman.   CHA2DS2-VASc Score = 7   This indicates a 11.2% annual risk of stroke. The patient's score is based upon: CHF History: 1 HTN History: 1 Diabetes History: 1 Stroke History: 2 Vascular Disease History: 1 Age Score: 1 Gender Score: 0   CAD Elevated Troponin secondary to demand ischemia due to severe anemia on arrival History of STEMI in 03/2019 s/p DES to RCA. This was complicated by sustained VT in setting of residual left coronary artery disease. He subsequently underwent PCI with DES to mid LAD in 06/2019.  High-sensitivity troponin 520 >> 498 this admission.  EKG showed atrial fibrillation, rate 119 bpm, with diffuse ST depressions. - No  active chest pain on arrival despite a Hb of 3.6g/dL - Echo notes mild reduction in LVEF from 50% to 45 to 50%. - No aspirin  given he is on DOAC at home. - Intolerant to statins in the past. Zetia  on hold in setting of transamnitis. Can resume when LFTs normalize. - Suspect troponin elevation and EKG changes due to severe anemia with signs of hypoperfusion. No plans for ischemic evaluation at this time.    Hypertension Continue medical therapy  Hyperlipidemia - Intolerant to statins in the past and has declined injectable medications. - Home Zetia  on hold in setting of transaminiitis. Can resume when LFTs normalize.   Otherwise, per primary team: - Severe symptomatic anemia: s/p  PRBCs - AKI: creatinine improving  - Transaminitis: improving - Rectal cancer  Medical Decision:  Complexity: High.thromboembolic prophylaxis, A-fib with RVR, presenting with severe anemia and multiorgan hypoperfusion Independently reviewed: Labs 12/06/2023, telemetry, vitals, I's and O's, endoscopy reports Prescription drug management -recommendations stated above Family updated: Younger, daughter at bedside  Further recommendations to follow as the case evolves.   This note was created using a voice recognition software as a result there may be grammatical errors inadvertently enclosed that do not reflect the nature of this encounter. Every attempt is made to correct such errors.   Madonna Michele HAS, [facility]  A Division of [facility] [address], [place], [place] [address]  [place], [place] [id number] Pager: [phone] Office: [phone] 12/06/2023 5:44 PM

## 2023-12-06 NOTE — Assessment & Plan Note (Addendum)
 Continues to be asymptomatic. - cardiology following

## 2023-12-07 ENCOUNTER — Other Ambulatory Visit (HOSPITAL_COMMUNITY): Payer: Self-pay

## 2023-12-07 DIAGNOSIS — I4891 Unspecified atrial fibrillation: Secondary | ICD-10-CM | POA: Diagnosis not present

## 2023-12-07 DIAGNOSIS — I5A Non-ischemic myocardial injury (non-traumatic): Secondary | ICD-10-CM

## 2023-12-07 DIAGNOSIS — D649 Anemia, unspecified: Secondary | ICD-10-CM | POA: Diagnosis not present

## 2023-12-07 LAB — CBC
HCT: 29.2 % — ABNORMAL LOW (ref 39.0–52.0)
HCT: 28.6 % — ABNORMAL LOW (ref 39.0–52.0)
Hemoglobin: 9.7 g/dL — ABNORMAL LOW (ref 13.0–17.0)
Hemoglobin: 9.5 g/dL — ABNORMAL LOW (ref 13.0–17.0)
MCH: 31.3 pg (ref 26.0–34.0)
MCH: 32.2 pg (ref 26.0–34.0)
MCHC: 33.2 g/dL (ref 30.0–36.0)
MCHC: 33.2 g/dL (ref 30.0–36.0)
MCV: 94.2 fL (ref 80.0–100.0)
MCV: 96.9 fL (ref 80.0–100.0)
Platelets: 137 K/uL — ABNORMAL LOW (ref 150–400)
Platelets: 122 K/uL — ABNORMAL LOW (ref 150–400)
RBC: 3.1 MIL/uL — ABNORMAL LOW (ref 4.22–5.81)
RBC: 2.95 MIL/uL — ABNORMAL LOW (ref 4.22–5.81)
RDW: 18.5 % — ABNORMAL HIGH (ref 11.5–15.5)
RDW: 18.6 % — ABNORMAL HIGH (ref 11.5–15.5)
WBC: 2.7 K/uL — ABNORMAL LOW (ref 4.0–10.5)
WBC: 2.5 K/uL — ABNORMAL LOW (ref 4.0–10.5)
nRBC: 1.8 % — ABNORMAL HIGH (ref 0.0–0.2)
nRBC: 1.2 % — ABNORMAL HIGH (ref 0.0–0.2)

## 2023-12-07 LAB — COMPREHENSIVE METABOLIC PANEL WITH GFR
ALT: 110 U/L — ABNORMAL HIGH (ref 0–44)
AST: 22 U/L (ref 15–41)
Albumin: 3 g/dL — ABNORMAL LOW (ref 3.5–5.0)
Alkaline Phosphatase: 47 U/L (ref 38–126)
Anion gap: 7 (ref 5–15)
BUN: 22 mg/dL (ref 8–23)
CO2: 23 mmol/L (ref 22–32)
Calcium: 8.5 mg/dL — ABNORMAL LOW (ref 8.9–10.3)
Chloride: 110 mmol/L (ref 98–111)
Creatinine, Ser: 1.29 mg/dL — ABNORMAL HIGH (ref 0.61–1.24)
GFR, Estimated: >60 mL/min (ref >60)
Glucose, Bld: 178 mg/dL — ABNORMAL HIGH (ref 70–99)
Potassium: 3.6 mmol/L (ref 3.5–5.1)
Sodium: 140 mmol/L (ref 135–145)
Total Bilirubin: 1.5 mg/dL — ABNORMAL HIGH (ref 0.0–1.2)
Total Protein: 5.6 g/dL — ABNORMAL LOW (ref 6.5–8.1)

## 2023-12-07 LAB — GLUCOSE, CAPILLARY
Glucose-Capillary: 167 mg/dL — ABNORMAL HIGH (ref 70–99)
Glucose-Capillary: 214 mg/dL — ABNORMAL HIGH (ref 70–99)

## 2023-12-07 LAB — MAGNESIUM: Magnesium: 2.1 mg/dL (ref 1.7–2.4)

## 2023-12-07 MED ORDER — POTASSIUM CHLORIDE 20 MEQ PO PACK
40.0000 meq | PACK | Freq: Two times a day (BID) | ORAL | Status: DC
  Administered 2023-12-07: 40 meq via ORAL
  Filled 2023-12-07: qty 2

## 2023-12-07 MED ORDER — HEPARIN SOD (PORK) LOCK FLUSH 100 UNIT/ML IV SOLN
500.0000 [IU] | INTRAVENOUS | Status: AC | PRN
  Administered 2023-12-07: 500 [IU]

## 2023-12-07 MED ORDER — METOPROLOL TARTRATE 50 MG PO TABS
50.0000 mg | ORAL_TABLET | Freq: Two times a day (BID) | ORAL | 1 refills | Status: DC
  Filled 2023-12-07: qty 60, 30d supply, fill #0

## 2023-12-07 MED ORDER — PANTOPRAZOLE SODIUM 40 MG PO TBEC
40.0000 mg | DELAYED_RELEASE_TABLET | Freq: Every day | ORAL | 1 refills | Status: DC
  Filled 2023-12-07: qty 30, 30d supply, fill #0

## 2023-12-07 NOTE — Discharge Instructions (Addendum)
 Dear [name],   Thank you for letting us  participate in your care!  You were admitted to the hospital due to severe anemia.  We gave you blood transfusions.  You had an EGD and colonoscopy that was normal. We are continuing your Eliquis , and sending you home with metoprolol  50 mg twice a day.  Please continue taking Protonix  until you follow-up with your doctors. Please follow-up with your primary care doctor next week and cardiology on 8/27 as listed below.   POST-HOSPITAL & CARE INSTRUCTIONS We recommend following up with your PCP within 1 week from being discharged from the hospital. Please let PCP/Specialists know of any changes in medications that were made which you will be able to see in the medications section of this packet. If PCP is unable to view your discharge summary and hospital course, please show them the following recommendations:  Recommendations for PCP: Follow up surgical pathology from EGD, colonoscopy; repeat colonoscopy date TBD by pathology results  Reevaluate Eliquis  dose given initially worsened anemia. Cards does not favor anticoagulation given severe anemia and recommends left atrial appendage occluder device, follow up with patient.  Once daily PPI recommended by GI following EGD, evaluate duration of therapy Repeat CBC, monitor Hgb closely Follow up with cards regarding watchman, cards messaged Dr. Mady and team coordinator to expedite this   DOCTOR'S APPOINTMENTS & FOLLOW UP Future Appointments  Date Time Provider Department Center  12/25/2023  2:40 PM End, Lonni, MD [facility] None     Thank you for choosing [facility]! Take care and be well!  Family Medicine Teaching Service Inpatient Team [place]  [facility]  [address] [place], [place] [address] [phone]

## 2023-12-07 NOTE — Plan of Care (Signed)
 I spoke with John Douglas and his two daughters at bedside. His daughters were concerned with his restart of Eliquis  that his hgb would drop again. We discussed his hemoglobin has remained stable after blood transfusions while being anticoagulated on heparin  and repeat hgb this morning was stable after first dose of eliquis . All questions were answered at bedside, and I went through the discharge instructions and medications so the patient and family are reassured of his outpatient plan of care. Again, recommended close PCP monitoring and Hgb check and follow up with cardiology and oncology.  John Douglas COME

## 2023-12-07 NOTE — Assessment & Plan Note (Deleted)
***   It appears cards extensively counseled the patient on management options for afib given hgb 3.6 on admission and concern for bleed with eliquis . Also, given recent echo findings, they recommend optimizing b blocker therapy as opposed to his prior CCB. - cardiology following - restarting eliquis  today - cards increased lopressor  to 50 mg BID

## 2023-12-07 NOTE — Discharge Summary (Addendum)
 Family Medicine Teaching [facility] Discharge Summary  Patient name: John Douglas Medical record number: [id number] Date of birth: May 13, 1955 Age: 68 y.o. Gender: male Date of Admission: 12/03/2023  Date of Discharge: 8/925 Admitting Physician: Elio Alena Morrison, MD  Primary Care Provider: Stephanie Charlene CROME, MD Consultants: GI, oncology, cardiology  Indication for Hospitalization: severe, symptomatic anemia  Discharge Diagnoses/Problem List:  Principal Problem:   Symptomatic anemia Active Problems:   Atrial fibrillation with RVR (HCC)   Chronic health problem   AKI (acute kidney injury) (HCC)   Transaminitis   Infiltrate of lung present on chest x-ray   Atherosclerosis of native coronary artery of native heart without angina pectoris   Heme positive stool   Gastric erosion   Duodenal erosion   History of rectal cancer   Benign neoplasm of transverse colon   Colonic stricture (HCC)   Mixed hyperlipidemia   Benign hypertension   The above problem list has been updated and reviewed for accuracy, including the initial reason for admission.   Brief Hospital Course:  68 yo male with rectal cancer on Lonsurf  chemotherapy and afib admitted to the [facility] Medicine Teaching service for symptomatic anemia of unclear etiology.   Anemia Patient admitted with Hgb 3.6. S/P 6 pRBCs. Hemoglobin stable around 10. Anemia workup revealed anemia of chronic disease. Low reticulocyte prompting EPO which was appropriately elevated, suspect marrow suppression from chemotherapy. GI consulted and performed EGD and colonoscopy on 12/06/23 that did not demonstrate a bleed. Biopsies taken, surgical pathology pending. Unclear what caused the acute drop in hemoglobin but suspect marrow suppression due to chemotherapy. Patient's anemia was stable and asymptomatic at discharge.   Atrial fibrillation Irregular rhythm and rates 90-130s on admission. Eliquis  held due to suspected GI bleed.  Cardiology consulted who suspected initial tachycardia due to anemia. Slowly restarted rate control meds while monitoring BP. Echo on 12/06/23 showed EF reduced 45-50% with mild decreased LV function. Diltiazem  held given worsened EF, LV function. Beta blocker optimization recommended for Rate control. Anticoagulated with heparin  drip during admission based upon GI recs, patient preference, and history of CVA when not anticoagulated. Following scopes, Eliquis  restarted on 12/07/23 per GI recommendation.  AKI Creatinine elevated to 2.08 from baseline ~1. Suspect prerenal in the setting of symptomatic anemia. Cr improving with treatment of anemia, and 1.29 at discharge.   Transaminitis AST/ALT elevated to 164/217 from normal baseline. RUQ US  revealed hepatic steatosis and hepatitis panel negative. Suspect due to ischemia from severe anemia. LFTs trended down during admission and improved to 22/110 at time of discharge.   PCP recommendations: Follow up surgical pathology from EGD, colonoscopy; repeat colonoscopy date TBD by pathology results  Reevaluate Eliquis  dose given initially worsened anemia. Cards does not favor anticoagulation given severe anemia and recommends left atrial appendage occluder device, follow up with patient.  Once daily PPI recommended by GI following EGD, evaluate duration of therapy Repeat CBC, monitor Hgb closely Follow up with cards regarding watchman, cards messaged Dr. Mady and team coordinator to expedite this     Results/Tests Pending at Time of Discharge:   Surgical Pathology, in process: stomach biopsy r/o h pylori; transverse colon polyp; distal sigmoid/rectal stricture at previous colon cancer r/o dysplasia  Disposition: home  Discharge Condition: stable  Discharge Exam:  Vitals:   12/07/23 0407 12/07/23 0731  BP: (!) 141/103 133/87  Pulse: 96 99  Resp: 20 18  Temp: 98.5 F (36.9 C) 98.5 F (36.9 C)  SpO2: 96% 98%   General: patient  sleeping comfortably  in bed CV: regular rate, irregular rhythm, no m/r/g Pulm: CTAB, no increased WOB Abdomen: soft, nontender Ext: no peripheral edema  Significant Procedures:   EGD 12/06/23 - Normal esophagus.  - A few scattered erosions in the prepyloric antrum with no bleeding and no stigmata of recent bleeding. Biopsied to exclude H. Pylori.  - Duodenal erosion ( single) without bleeding in the bulb. - Normal second portion of the duodenum.  Colonoscopy 12/06/23 - Perianal skin tags found on perianal exam.  - The examined portion of the ileum was normal.  - One 5 mm polyp in the transverse colon, removed with a cold snare. Resected and retrieved.  - Stricture in the proximal rectum ( described above) . Tattoo proximal and distal to the stricture. Nodular mucosa at level of stenosis without obvious malignancy. Biopsied.  - Mild diverticulosis in the sigmoid colon.  - Internal hemorrhoids.   Significant Labs and Imaging:  Recent Labs  Lab 12/06/23 1145 12/06/23 2100 12/07/23 0400  WBC 2.5* 2.9* 2.7*  HGB 9.9* 10.1* 9.7*  HCT 29.1* 30.1* 29.2*  PLT 153 138* 137*   Recent Labs  Lab 12/06/23 0400 12/07/23 0400  NA 139 140  K 3.7 3.6  CL 108 110  CO2 23 23  GLUCOSE 157* 178*  BUN 17 22  CREATININE 1.25* 1.29*  CALCIUM  8.7* 8.5*  MG 2.1 2.1  ALKPHOS 43 47  AST 29 22  ALT 161* 110*  ALBUMIN 3.0* 3.0*    Echo 12/04/23 Left Ventricle: Left ventricular ejection fraction, by estimation, is 45  to 50%. The left ventricle has mildly decreased function. The left  ventricle demonstrates global hypokinesis. Definity  contrast agent was given IV to delineate the left ventricular  endocardial borders. The left ventricular internal cavity size was normal in size. There is mild left ventricular hypertrophy. Left ventricular diastolic parameters are indeterminate.  Right Ventricle: The right ventricular size is normal. No increase in  right ventricular wall thickness. Right ventricular systolic function  is  normal. Tricuspid regurgitation signal is inadequate for assessing PA  pressure. The tricuspid regurgitant  velocity is 1.48 m/s, and with an assumed right atrial pressure of 3 mmHg,  the estimated right ventricular systolic pressure is 11.8 mmHg.  Left Atrium: Left atrial size was moderately dilated.  Right Atrium: Right atrial size was normal in size.  Pericardium: There is no evidence of pericardial effusion.  Mitral Valve: The mitral valve is grossly normal. Trivial mitral valve  regurgitation. No evidence of mitral valve stenosis.  Tricuspid Valve: The tricuspid valve is normal in structure. Tricuspid  valve regurgitation is trivial. No evidence of tricuspid stenosis.  Aortic Valve: The aortic valve is abnormal. There is moderate  calcification of the aortic valve. Aortic valve regurgitation is not  visualized. Aortic valve sclerosis/calcification is present, without any  evidence of aortic stenosis. Aortic valve mean  gradient measures 5.0 mmHg. Aortic valve peak gradient measures 9.9 mmHg.  Aortic valve area, by VTI measures 2.50 cm.  Pulmonic Valve: The pulmonic valve was normal in structure. Pulmonic valve regurgitation is trivial. No evidence of pulmonic stenosis.  Aorta: The aortic root is normal in size and structure.  Venous: The inferior vena cava is normal in size with greater than 50% respiratory variability, suggesting right atrial pressure of 3 mmHg.  IAS/Shunts: No atrial level shunt detected by color flow Doppler.    Discharge Medications:  Allergies as of 12/07/2023       Reactions   Aleve [naproxen]  Other (See Comments)   Told to avoid by MD   Celexa  [citalopram ] Other (See Comments)   Contraindicated with A-Fib   Motrin [ibuprofen] Other (See Comments)   Told to avoid by MD   Yellow Jacket Venom [bee Venom] Swelling, Other (See Comments)   Severe swelling where stung   Penicillins Hives   Xgeva  [denosumab ] Other (See Comments)   Runny nose and epistaxis   Lower back pain Watery discharge from eyes Raw sinuses   Glucophage [metformin] Diarrhea, Other (See Comments)   GI bleeding Blood stool        Medication List     STOP taking these medications    diltiazem  240 MG 24 hr capsule Commonly known as: CARDIZEM  CD   HYDROcodone -acetaminophen  5-325 MG tablet Commonly known as: Norco   metoprolol  succinate 50 MG 24 hr tablet Commonly known as: TOPROL -XL       TAKE these medications    acetaminophen  500 MG tablet Commonly known as: TYLENOL  Take 500-1,000 mg by mouth 2 (two) times daily as needed for moderate pain (pain score 4-6), fever or headache.   Eliquis  5 MG Tabs tablet Generic drug: apixaban  TAKE 1 TABLET BY MOUTH TWICE A DAY   ezetimibe  10 MG tablet Commonly known as: ZETIA  Take 1 tablet by mouth once daily What changed: when to take this   fluticasone  50 MCG/ACT nasal spray Commonly known as: Flonase  Place 1 spray into both nostrils 2 (two) times daily as needed (sinus congestion).   furosemide  40 MG tablet Commonly known as: LASIX  TAKE 1 TABLET BY MOUTH EVERY DAY   glimepiride  1 MG tablet Commonly known as: AMARYL  Take 1 mg by mouth daily.   Lonsurf  15-6.14 MG tablet Generic drug: trifluridine -tipiracil  Take 4 tablets (60 mg of trifluridine  total) by mouth 2 (two) times daily after a meal. Take within 1 hr after AM & PM meals on days 1-5, 8-12. Repeat every 28 days.   metoprolol  tartrate 50 MG tablet Commonly known as: LOPRESSOR  Take 1 tablet (50 mg total) by mouth 2 (two) times daily.   pantoprazole  40 MG tablet Commonly known as: PROTONIX  Take 1 tablet (40 mg total) by mouth daily before breakfast.        Discharge Instructions: Please refer to Patient Instructions section of EMR for full details.  Patient was counseled important signs and symptoms that should prompt return to medical care, changes in medications, dietary instructions, activity restrictions, and follow up appointments.    Follow-Up Appointments: Please call your PCP to schedule an appointment in the next week     Alena Morrison, Elio, MD 12/07/2023, 9:47 AM PGY-1, [facility]   Upper Level Addendum: I have seen and evaluated this patient along with Dr. Alena and reviewed the above note, making necessary revisions as appropriate. I agree with the medical decision making and physical exam as noted above. Elyce Prescott, DO PGY-2 [facility] Family Medicine Residency

## 2023-12-07 NOTE — Progress Notes (Signed)
 Progress Note  Patient Name: John Douglas Date of Encounter: 12/07/2023  Primary Cardiologist: Lonni Hanson, MD  Subjective   No symptoms.  Sleeping well.  Inpatient Medications    Scheduled Meds:  apixaban   5 mg Oral BID   Chlorhexidine  Gluconate Cloth  6 each Topical Daily   folic acid   2 mg Oral Daily   insulin  aspart  0-9 Units Subcutaneous TID WC   metoprolol  tartrate  50 mg Oral BID   Na Sulfate-K Sulfate-Mg Sulfate concentrate  0.5 kit Oral Once   pantoprazole   40 mg Oral QAC breakfast   potassium chloride   40 mEq Oral BID   simethicone   240 mg Oral Once   sodium chloride  flush  10-40 mL Intracatheter Q12H   Continuous Infusions:  PRN Meds: acetaminophen , mouth rinse, sodium chloride  flush   Vital Signs    Vitals:   12/06/23 1927 12/06/23 2324 12/07/23 0407 12/07/23 0731  BP: (!) 136/90 113/76 (!) 141/103 133/87  Pulse: 100 99 96 99  Resp: 16 18 20 18   Temp: 98.5 F (36.9 C) 98.4 F (36.9 C) 98.5 F (36.9 C) 98.5 F (36.9 C)  TempSrc: Oral Oral Oral Oral  SpO2: 95% 96% 96% 98%  Weight:      Height:        Intake/Output Summary (Last 24 hours) at 12/07/2023 1056 Last data filed at 12/07/2023 0904 Gross per 24 hour  Intake 580 ml  Output 550 ml  Net 30 ml   Filed Weights   12/03/23 0946  Weight: 97.5 kg    Telemetry     Personally reviewed.  In A-fib, HR 90-100s  ECG    Not performed today.  Physical Exam   GEN: No acute distress.   Neck: No JVD. Cardiac: RRR, no murmur, rub, or gallop.  Respiratory: Nonlabored. Clear to auscultation bilaterally. GI: Soft, nontender, bowel sounds present. MS: No edema; No deformity. Neuro:  Nonfocal. Psych: Alert and oriented x 3. Normal affect.  Labs    Chemistry Recent Labs  Lab 12/05/23 0600 12/05/23 0800 12/06/23 0400 12/07/23 0400  NA 137  --  139 140  K 3.2*  --  3.7 3.6  CL 104  --  108 110  CO2 24  --  23 23  GLUCOSE 210*  --  157* 178*  BUN 22  --  17 22  CREATININE 1.27*   --  1.25* 1.29*  CALCIUM  8.5*  --  8.7* 8.5*  PROT 5.4*  --  5.7* 5.6*  ALBUMIN 2.9*  --  3.0* 3.0*  AST 60*  --  29 22  ALT 233*  --  161* 110*  ALKPHOS 49  --  43 47  BILITOT 1.8* 1.5* 1.4* 1.5*  GFRNONAA >60  --  >60 >60  ANIONGAP 9  --  8 7     Hematology Recent Labs  Lab 12/06/23 1145 12/06/23 2100 12/07/23 0400  WBC 2.5* 2.9* 2.7*  RBC 3.12* 3.23* 3.10*  HGB 9.9* 10.1* 9.7*  HCT 29.1* 30.1* 29.2*  MCV 93.3 93.2 94.2  MCH 31.7 31.3 31.3  MCHC 34.0 33.6 33.2  RDW 18.1* 18.2* 18.5*  PLT 153 138* 137*    Cardiac Enzymes Recent Labs  Lab 12/03/23 0957 12/03/23 1157  TROPONINIHS 520* 498*    BNPNo results for input(s): BNP, PROBNP in the last 168 hours.   DDimerNo results for input(s): DDIMER in the last 168 hours.   Radiology    No results found.   Assessment &  Plan    Atrial fibrillation with RVR - Likely in setting of symptomatic anemia. - Echocardiogram this admission showed LVEF 45 to 50%. - Diltiazem  was held due to LVEF 45%. - Patient extremely upset about holding diltiazem .  He reported that diltiazem  is only medication that controls his heart rate.  Explained to him that LVEF 45% was the reason his diltiazem  was held.  Can consider repeating limited echocardiogram in the outpatient setting prior to seeing Dr. Mady. -Telemetry reviewed today, in A-fib, HR 90-100s - Continue Eliquis  5 mg twice daily. - Dr Michele discussed about Watchman device implantation but patient now not interested anymore.  Symptomatic anemia - Hb dropped from 10 to 3 ish - s/p PRBC transfusions. - GI on board, scope performed, no bleeding etiology. - Anemia deemed to be secondary to chemotherapy induced marrow suppression  Myocardial injury - Secondary to demand ischemia from anemia. - No further intervention required.  AKI, resolved. Transaminitis, outpatient management, per primary.  Disposition: Patient wants to go home today.  Signed, Diannah SHAUNNA Maywood,  MD  12/07/2023, 10:56 AM

## 2023-12-07 NOTE — Assessment & Plan Note (Deleted)
 Hgb currently stable. Scopes did not reveal a bleed. - GI following - oncology following - Kcl 40 meq given PO - Protonix  40 mg once daily

## 2023-12-07 NOTE — Assessment & Plan Note (Signed)
 Cr has been stable for the last three days***. Baseline ~1 - AM BMP

## 2023-12-07 NOTE — Assessment & Plan Note (Deleted)
 Diabetes: A1C 6.0, sensitive sliding scale insulin  ordered HLD: holding zetia  until LFT improve.

## 2023-12-07 NOTE — Progress Notes (Incomplete)
     Daily Progress Note Intern Pager: [phone]  Patient name: John Douglas Medical record number: [id number] Date of birth: [birth year] Age: 68 y.o. Gender: male  Primary Care Provider: Stephanie Charlene CROME, MD Consultants: GI, oncology, cards Code Status: full  Pt Overview and Major Events to Date:  12/03/23 - admitted and received 4 pRBC 12/04/23 - 2 pRBC 12/06/23 - EGD & colonoscopy  Assessment and Plan:  68 yo male admitted for symptomatic anemia that is now stable, GI bleed ruled out. Unclear what caused his acute hgb decline initially, but he remains stable now. Pertinent PMH/PSH includes rectal cancer on chemo, afib, DM, HLD.  Assessment & Plan Symptomatic anemia Hgb currently stable. Scopes did not reveal a bleed. - GI following - oncology following - Kcl 40 meq given PO - Protonix  40 mg once daily Atrial fibrillation with RVR (HCC) *** It appears cards extensively counseled the patient on management options for afib given hgb 3.6 on admission and concern for bleed with eliquis . Also, given recent echo findings, they recommend optimizing b blocker therapy as opposed to his prior CCB. - cardiology following - restarting eliquis  today - cards increased lopressor  to 50 mg BID AKI (acute kidney injury) (HCC) Cr has been stable for the last three days***. Baseline ~1 - AM BMP Chronic health problem Diabetes: A1C 6.0, sensitive sliding scale insulin  ordered HLD: holding zetia  until LFT improve.  FEN/GI: heart healthy PPx: *** Dispo:{FPTSDISOLIST:27587} {FPTSDISOTIME:27588}. Barriers include ***.   Subjective:  ***  Objective: Temp:  [97 F (36.1 C)-98.7 F (37.1 C)] 98.5 F (36.9 C) (08/09 0407) Pulse Rate:  [87-118] 96 (08/09 0407) Resp:  [15-20] 20 (08/09 0407) BP: (105-155)/(76-105) 141/103 (08/09 0407) SpO2:  [95 %-99 %] 96 % (08/09 0407) Physical Exam: General: *** Cardiovascular: *** Respiratory: *** Abdomen: *** Extremities: ***  Laboratory: Most recent  CBC Lab Results  Component Value Date   WBC 2.7 (L) 12/07/2023   HGB 9.7 (L) 12/07/2023   HCT 29.2 (L) 12/07/2023   MCV 94.2 12/07/2023   PLT 137 (L) 12/07/2023   Most recent BMP    Latest Ref Rng & Units 12/07/2023    4:00 AM  BMP  Glucose 70 - 99 mg/dL 821   BUN 8 - 23 mg/dL 22   Creatinine 9.38 - 1.24 mg/dL 8.70   Sodium 864 - 854 mmol/L 140   Potassium 3.5 - 5.1 mmol/L 3.6   Chloride 98 - 111 mmol/L 110   CO2 22 - 32 mmol/L 23   Calcium  8.9 - 10.3 mg/dL 8.5     Other pertinent labs ***   Imaging/Diagnostic Tests: Radiologist Impression: *** My interpretation: *** Alena Napoleon Corrente, MD 12/07/2023, 7:18 AM  PGY-***, [facility] Family Medicine FPTS Intern pager: [phone], text pages welcome Secure chat group [facility] Teaching Service

## 2023-12-09 ENCOUNTER — Encounter (HOSPITAL_COMMUNITY): Payer: Self-pay | Admitting: Internal Medicine

## 2023-12-09 LAB — SURGICAL PATHOLOGY

## 2023-12-10 ENCOUNTER — Other Ambulatory Visit: Payer: Self-pay

## 2023-12-10 ENCOUNTER — Inpatient Hospital Stay: Attending: Hematology & Oncology

## 2023-12-10 ENCOUNTER — Inpatient Hospital Stay

## 2023-12-10 DIAGNOSIS — D649 Anemia, unspecified: Secondary | ICD-10-CM | POA: Insufficient documentation

## 2023-12-10 DIAGNOSIS — Z7901 Long term (current) use of anticoagulants: Secondary | ICD-10-CM | POA: Insufficient documentation

## 2023-12-10 DIAGNOSIS — Z7982 Long term (current) use of aspirin: Secondary | ICD-10-CM | POA: Insufficient documentation

## 2023-12-10 DIAGNOSIS — Z452 Encounter for adjustment and management of vascular access device: Secondary | ICD-10-CM | POA: Diagnosis not present

## 2023-12-10 DIAGNOSIS — I4891 Unspecified atrial fibrillation: Secondary | ICD-10-CM | POA: Insufficient documentation

## 2023-12-10 DIAGNOSIS — C20 Malignant neoplasm of rectum: Secondary | ICD-10-CM | POA: Insufficient documentation

## 2023-12-10 DIAGNOSIS — D500 Iron deficiency anemia secondary to blood loss (chronic): Secondary | ICD-10-CM

## 2023-12-10 DIAGNOSIS — Z8673 Personal history of transient ischemic attack (TIA), and cerebral infarction without residual deficits: Secondary | ICD-10-CM | POA: Insufficient documentation

## 2023-12-10 LAB — CBC WITH DIFFERENTIAL (CANCER CENTER ONLY)
Abs Immature Granulocytes: 0.02 K/uL (ref 0.00–0.07)
Basophils Absolute: 0 K/uL (ref 0.0–0.1)
Basophils Relative: 0 %
Eosinophils Absolute: 0 K/uL (ref 0.0–0.5)
Eosinophils Relative: 2 %
HCT: 32.6 % — ABNORMAL LOW (ref 39.0–52.0)
Hemoglobin: 11.1 g/dL — ABNORMAL LOW (ref 13.0–17.0)
Immature Granulocytes: 1 %
Lymphocytes Relative: 20 %
Lymphs Abs: 0.5 K/uL — ABNORMAL LOW (ref 0.7–4.0)
MCH: 31.6 pg (ref 26.0–34.0)
MCHC: 34 g/dL (ref 30.0–36.0)
MCV: 92.9 fL (ref 80.0–100.0)
Monocytes Absolute: 0.3 K/uL (ref 0.1–1.0)
Monocytes Relative: 12 %
Neutro Abs: 1.5 K/uL — ABNORMAL LOW (ref 1.7–7.7)
Neutrophils Relative %: 65 %
Platelet Count: 121 K/uL — ABNORMAL LOW (ref 150–400)
RBC: 3.51 MIL/uL — ABNORMAL LOW (ref 4.22–5.81)
RDW: 17.8 % — ABNORMAL HIGH (ref 11.5–15.5)
WBC Count: 2.3 K/uL — ABNORMAL LOW (ref 4.0–10.5)
nRBC: 0 % (ref 0.0–0.2)

## 2023-12-10 LAB — CMP (CANCER CENTER ONLY)
ALT: 55 U/L — ABNORMAL HIGH (ref 0–44)
AST: 18 U/L (ref 15–41)
Albumin: 4 g/dL (ref 3.5–5.0)
Alkaline Phosphatase: 53 U/L (ref 38–126)
Anion gap: 12 (ref 5–15)
BUN: 17 mg/dL (ref 8–23)
CO2: 26 mmol/L (ref 22–32)
Calcium: 9 mg/dL (ref 8.9–10.3)
Chloride: 102 mmol/L (ref 98–111)
Creatinine: 0.98 mg/dL (ref 0.61–1.24)
GFR, Estimated: >60 mL/min (ref >60)
Glucose, Bld: 146 mg/dL — ABNORMAL HIGH (ref 70–99)
Potassium: 3.5 mmol/L (ref 3.5–5.1)
Sodium: 141 mmol/L (ref 135–145)
Total Bilirubin: 1.7 mg/dL — ABNORMAL HIGH (ref 0.0–1.2)
Total Protein: 6.7 g/dL (ref 6.5–8.1)

## 2023-12-10 LAB — IRON AND IRON BINDING CAPACITY (CC-WL,HP ONLY)
Iron: 49 ug/dL (ref 45–182)
Saturation Ratios: 19 % (ref 17.9–39.5)
TIBC: 259 ug/dL (ref 250–450)
UIBC: 210 ug/dL

## 2023-12-10 LAB — FERRITIN: Ferritin: 1300 ng/mL — ABNORMAL HIGH (ref 24–336)

## 2023-12-10 LAB — MAGNESIUM: Magnesium: 2 mg/dL (ref 1.7–2.4)

## 2023-12-11 ENCOUNTER — Ambulatory Visit: Payer: Self-pay | Admitting: Internal Medicine

## 2023-12-12 ENCOUNTER — Other Ambulatory Visit: Payer: Self-pay

## 2023-12-15 ENCOUNTER — Encounter: Payer: Self-pay | Admitting: Hematology & Oncology

## 2023-12-24 ENCOUNTER — Other Ambulatory Visit: Payer: Self-pay | Admitting: *Deleted

## 2023-12-24 DIAGNOSIS — C20 Malignant neoplasm of rectum: Secondary | ICD-10-CM

## 2023-12-25 ENCOUNTER — Encounter: Payer: Self-pay | Admitting: Internal Medicine

## 2023-12-25 ENCOUNTER — Inpatient Hospital Stay: Admitting: Hematology & Oncology

## 2023-12-25 ENCOUNTER — Encounter: Payer: Self-pay | Admitting: Hematology & Oncology

## 2023-12-25 ENCOUNTER — Inpatient Hospital Stay

## 2023-12-25 ENCOUNTER — Ambulatory Visit: Attending: Internal Medicine | Admitting: Internal Medicine

## 2023-12-25 VITALS — BP 112/68 | HR 93 | Ht 70.0 in | Wt 208.6 lb

## 2023-12-25 VITALS — BP 101/75 | HR 80 | Temp 98.0°F | Resp 18 | Ht 70.0 in | Wt 207.1 lb

## 2023-12-25 DIAGNOSIS — C20 Malignant neoplasm of rectum: Secondary | ICD-10-CM

## 2023-12-25 DIAGNOSIS — I5020 Unspecified systolic (congestive) heart failure: Secondary | ICD-10-CM | POA: Diagnosis not present

## 2023-12-25 DIAGNOSIS — I2510 Atherosclerotic heart disease of native coronary artery without angina pectoris: Secondary | ICD-10-CM

## 2023-12-25 DIAGNOSIS — I4821 Permanent atrial fibrillation: Secondary | ICD-10-CM | POA: Diagnosis not present

## 2023-12-25 DIAGNOSIS — I4819 Other persistent atrial fibrillation: Secondary | ICD-10-CM

## 2023-12-25 LAB — CMP (CANCER CENTER ONLY)
ALT: 13 U/L (ref 0–44)
AST: 18 U/L (ref 15–41)
Albumin: 4.2 g/dL (ref 3.5–5.0)
Alkaline Phosphatase: 57 U/L (ref 38–126)
Anion gap: 13 (ref 5–15)
BUN: 20 mg/dL (ref 8–23)
CO2: 27 mmol/L (ref 22–32)
Calcium: 9.4 mg/dL (ref 8.9–10.3)
Chloride: 104 mmol/L (ref 98–111)
Creatinine: 1.22 mg/dL (ref 0.61–1.24)
GFR, Estimated: >60 mL/min (ref >60)
Glucose, Bld: 175 mg/dL — ABNORMAL HIGH (ref 70–99)
Potassium: 3.2 mmol/L — ABNORMAL LOW (ref 3.5–5.1)
Sodium: 143 mmol/L (ref 135–145)
Total Bilirubin: 1.2 mg/dL (ref 0.0–1.2)
Total Protein: 7.1 g/dL (ref 6.5–8.1)

## 2023-12-25 LAB — CBC WITH DIFFERENTIAL (CANCER CENTER ONLY)
Abs Immature Granulocytes: 0.08 K/uL — ABNORMAL HIGH (ref 0.00–0.07)
Basophils Absolute: 0 K/uL (ref 0.0–0.1)
Basophils Relative: 1 %
Eosinophils Absolute: 0 K/uL (ref 0.0–0.5)
Eosinophils Relative: 1 %
HCT: 29.6 % — ABNORMAL LOW (ref 39.0–52.0)
Hemoglobin: 10.3 g/dL — ABNORMAL LOW (ref 13.0–17.0)
Immature Granulocytes: 2 %
Lymphocytes Relative: 17 %
Lymphs Abs: 0.7 K/uL (ref 0.7–4.0)
MCH: 31.5 pg (ref 26.0–34.0)
MCHC: 34.8 g/dL (ref 30.0–36.0)
MCV: 90.5 fL (ref 80.0–100.0)
Monocytes Absolute: 0.4 K/uL (ref 0.1–1.0)
Monocytes Relative: 9 %
Neutro Abs: 2.9 K/uL (ref 1.7–7.7)
Neutrophils Relative %: 70 %
Platelet Count: 199 K/uL (ref 150–400)
RBC: 3.27 MIL/uL — ABNORMAL LOW (ref 4.22–5.81)
RDW: 17.7 % — ABNORMAL HIGH (ref 11.5–15.5)
WBC Count: 4.2 K/uL (ref 4.0–10.5)
nRBC: 0.5 % — ABNORMAL HIGH (ref 0.0–0.2)

## 2023-12-25 MED ORDER — POTASSIUM CHLORIDE CRYS ER 20 MEQ PO TBCR: 20.0000 meq | EXTENDED_RELEASE_TABLET | Freq: Every day | ORAL | 3 refills | Status: DC

## 2023-12-25 MED ORDER — FUROSEMIDE 20 MG PO TABS: 20.0000 mg | ORAL_TABLET | Freq: Every day | ORAL | 3 refills | Status: AC

## 2023-12-25 NOTE — Patient Instructions (Signed)

## 2023-12-25 NOTE — Patient Instructions (Signed)
 Medication Instructions:  Your physician recommends the following medication changes.  START TAKING: Potassium 20 meq by mouth daily  DECREASE: Lasix  to 20 mg by mouth daily  *If you need a refill on your cardiac medications before your next appointment, please call your pharmacy*  Lab Work: No labs ordered today    Testing/Procedures: No test ordered today   Follow-Up: At [facility], you and your health needs are our priority.  As part of our continuing mission to provide you with exceptional heart care, our providers are all part of one team.  This team includes your primary Cardiologist (physician) and Advanced Practice Providers or APPs (Physician Assistants and Nurse Practitioners) who all work together to provide you with the care you need, when you need it.  Your next appointment:   3 month(s)  Provider:   You may see Lonni Hanson, MD or one of the following Advanced Practice Providers on your designated Care Team:   Lonni Meager, NP Lesley Maffucci, PA-C Bernardino Bring, PA-C Cadence [place], PA-C Tylene Lunch, NP Barnie Hila, NP

## 2023-12-25 NOTE — Progress Notes (Signed)
 Hematology and Oncology Follow Up Visit  John Douglas [id number] 1955-07-05 68 y.o. 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 twice daily Aspirin  81 mg p.o. daily FOLFOX/Vectibix  --start on 03/07/2023; s/p cycle #8 - d/c on 07/30/2023 Xgeva  120 mg subcu every 3 months-start 03/07/2023 - patient declined any further Xgeva . Lonsurf  60 mg po BID - start on 08/05/2023 -- changed on 09/20/2023 -- d/c on 11/25/2023                           Interim History:  John Douglas is here today for follow-up.  Unfortunately, he was recently admitted to the hospital because of severe pancytopenia.  This was mostly anemia.  I think his hemoglobin was down to 3.  I really thought that this was from GI bleeding.  However, he underwent upper endoscopy and lower endoscopy.  Nothing was found that showed any obvious bleeding.  He did have low normal iron studies.  It certainly could be from the Lonsurf  that he was on.  As such, we have discontinued the Lonsurf .  He is feeling better.  He was transfused.  We did check an erythropoietin  level on him.  This was quite high at 416.  As such, he may have a bone marrow that just  might be somewhat dysplastic.  I think we will probably have to repeat another PET scan on him.  His last 1 was done back in June.  Everything looked relatively stable.  His last CEA level back in June was 1.32.  His appetite has been okay.  He has had no nausea or vomiting.SABRA  He does have a little bit  of dizziness.  He is on anticoagulation because of atrial fibrillation.  He has had a past CVA.  Overall, I would have to say that his performance status is probably ECOG 1.  Medications:  Allergies as of 12/25/2023       Reactions   Aleve [naproxen] Other (See Comments)   Told to avoid by MD   Celexa  [citalopram ] Other (See Comments)   Contraindicated with A-Fib   Glucophage [metformin] Diarrhea, Other (See Comments)   GI bleeding Blood stool   Motrin [ibuprofen] Other (See Comments)   Told to avoid by MD   Yellow Jacket Venom [bee Venom] Swelling, Other (See Comments)   Severe swelling where stung   Penicillins Hives   Xgeva  [denosumab ] Other (See Comments)   Runny nose and epistaxis  Lower back pain Watery discharge from eyes Raw sinuses        Medication List        Accurate as of December 25, 2023 11:25 AM. If you have any questions, ask your nurse or doctor.          acetaminophen  500 MG tablet Commonly known as: TYLENOL  Take 500-1,000 mg by mouth 2 (two) times daily as needed for moderate pain (pain score 4-6), fever or headache.   Eliquis  5 MG Tabs tablet Generic drug: apixaban  TAKE 1 TABLET BY MOUTH TWICE A DAY   ezetimibe   10 MG tablet Commonly known as: ZETIA  Take 1 tablet by mouth once daily   fluticasone  50 MCG/ACT nasal spray Commonly known as: Flonase  Place 1 spray into both nostrils 2 (two) times daily as needed (sinus congestion).   furosemide  40 MG tablet Commonly known as: LASIX  TAKE 1 TABLET BY MOUTH EVERY DAY   glimepiride  1 MG tablet Commonly known as: AMARYL  Take 1 mg by mouth daily.   Lonsurf  15-6.14 MG tablet Generic drug: trifluridine -tipiracil  Take 4 tablets (60 mg of trifluridine  total) by mouth 2 (two) times daily after a meal. Take within 1 hr after AM & PM meals on days 1-5, 8-12. Repeat every 28 days.   metoprolol  tartrate 50 MG tablet Commonly known as: LOPRESSOR  Take 1 tablet (50 mg total) by mouth 2 (two) times  daily.   pantoprazole  40 MG tablet Commonly known as: PROTONIX  Take 1 tablet (40 mg total) by mouth daily before breakfast.        Allergies:  Allergies  Allergen Reactions   Aleve [Naproxen] Other (See Comments)    Told to avoid by MD   Celexa  [Citalopram ] Other (See Comments)    Contraindicated with A-Fib   Glucophage [Metformin] Diarrhea and Other (See Comments)    GI bleeding Blood stool   Motrin [Ibuprofen] Other (See Comments)    Told to avoid by MD   Yellow Jacket Venom [Bee Venom] Swelling and Other (See Comments)    Severe swelling where stung   Penicillins Hives   Xgeva  [Denosumab ] Other (See Comments)    Runny nose and epistaxis  Lower back pain Watery discharge from eyes Raw sinuses    Past Medical History, Surgical history, Social history, and Family History were reviewed and updated.  Review of Systems: Review of Systems  Constitutional: Negative.   HENT: Negative.    Eyes: Negative.   Respiratory: Negative.    Cardiovascular: Negative.   Gastrointestinal: Negative.   Genitourinary: Negative.   Musculoskeletal: Negative.   Skin: Negative.   Neurological: Negative.   Endo/Heme/Allergies: Negative.   Psychiatric/Behavioral: Negative.       Physical Exam:  height is 5' 10 (1.778 m) and weight is 207 lb 1.9 oz (93.9 kg). His oral temperature is 98 F (36.7 C). His blood pressure is 101/75 and his pulse is 80. His respiration is 18 and oxygen saturation is 100%.   Wt Readings from Last 3 Encounters:  12/25/23 207 lb 1.9 oz (93.9 kg)  12/03/23 215 lb (97.5 kg)  10/24/23 215 lb (97.5 kg)    Physical Exam Vitals reviewed.  HENT:     Head: Normocephalic and atraumatic.  Eyes:     Pupils: Pupils are equal, round, and reactive to light.  Cardiovascular:     Rate and Rhythm: Normal rate and regular rhythm.     Heart sounds: Normal heart sounds.  Pulmonary:     Effort: Pulmonary effort is normal.     Breath sounds: Normal breath sounds.   Abdominal:     General: Bowel sounds are normal.     Palpations: Abdomen is soft.  Musculoskeletal:        General: No tenderness or deformity. Normal range of motion.     Cervical back: Normal range of motion.  Lymphadenopathy:     Cervical: No cervical adenopathy.  Skin:    General: Skin is warm and dry.     Findings: No erythema or rash.  Neurological:     Mental Status: He is alert and oriented to person, place,  and time.  Psychiatric:        Behavior: Behavior normal.        Thought Content: Thought content normal.        Judgment: Judgment normal.     Lab Results  Component Value Date   WBC 4.2 12/25/2023   HGB 10.3 (L) 12/25/2023   HCT 29.6 (L) 12/25/2023   MCV 90.5 12/25/2023   PLT 199 12/25/2023   Lab Results  Component Value Date   FERRITIN 1,300 (H) 12/10/2023   IRON 49 12/10/2023   TIBC 259 12/10/2023   UIBC 210 12/10/2023   IRONPCTSAT 19 12/10/2023   Lab Results  Component Value Date   RETICCTPCT 1.7 12/04/2023   RBC 3.27 (L) 12/25/2023   No results found for: KPAFRELGTCHN, LAMBDASER, KAPLAMBRATIO No results found for: IGGSERUM, IGA, IGMSERUM No results found for: STEPHANY CARLOTA BENSON MARKEL EARLA JOANNIE DOC VICK, SPEI   Chemistry      Component Value Date/Time   NA 143 12/25/2023 1011   NA 140 08/28/2019 0940   K 3.2 (L) 12/25/2023 1011   CL 104 12/25/2023 1011   CO2 27 12/25/2023 1011   BUN 20 12/25/2023 1011   BUN 21 08/28/2019 0940   CREATININE 1.22 12/25/2023 1011      Component Value Date/Time   CALCIUM  9.4 12/25/2023 1011   ALKPHOS 57 12/25/2023 1011   AST 18 12/25/2023 1011   ALT 13 12/25/2023 1011   BILITOT 1.2 12/25/2023 1011       Impression and Plan: John Douglas is a pleasant 68 yo caucasian gentleman with history of CVA secondary to atrial fib and rectal bleeding. He was diagnosed with metastatic adenocarcinoma of the rectum -K-ras wild type/BRAF wild-type/HER-2 negative/MMR  proficient. He had adenopathy distant to the rectum and now has had disease recurrence.    I will have to see what the next PET scan shows.  This will certainly be important.  I am just surprised by his degree of anemia.  His hemoglobin is not too bad right now.  We will have to watch this closely.  Again, his erythropoietin  level has been quite high so I am not sure we could use ESA.  We really need to see what his CEA level is.  I like to have him come back in a month.  By then, the PET scan will be done and we can see if there is any evidence of disease activity.   Maude JONELLE Crease, MD 8/27/202511:25 AM

## 2023-12-25 NOTE — Progress Notes (Unsigned)
  Cardiology Office Note:  .   Date:  12/25/2023  ID:  [name], DOB Feb 25, 1956, MRN [id number] PCP: Stephanie Charlene CROME, MD  [facility] Providers Cardiologist:  Lonni Hanson, MD { Click to update primary MD,subspecialty MD or APP then REFRESH:1}    History of Present Illness: .   John Douglas is a 68 y.o. male with history of CAD with inferior STEMI and primary PCI to the RCA in 03/2019 complicated by sustained ventricular tachycardia in the setting of residual left coronary artery disease status post subsequent PCI to the mid LAD (06/2019), hypertension, hyperlipidemia, persistent atrial fibrillation complicated by stroke in 09/2019, ischemic cardiomyopathy, type 2 diabetes mellitus, and recurrent rectal cancer, who presents for follow-up of atrial fibrillation and CAD as well as recent hospitalization for severe anemia.  I last saw John Douglas in February, at which time he was feeling fairly well, as the side effects from Xgeva  therapy had resolved.  He was hospitalized in early August with severe anemia with a hemoglobin of 3.6.  It was ultimately felt that bone marrow suppression caused his anemia, as upper and lower endoscopies did not reveal any source of bleeding.  His hospital course was complicated by atrial fibrillation with rapid ventricular response.  LVEF was mildly reduced at 45-50%, prompting transition from diltiazem  to metoprolol  for rate control.  He was started on apixaban  again prior to discharge.  Put on higher dose of iron by ennever.  Chemo currently on hold.  Some mild low-back pain again.  ROS: See HPI  Studies Reviewed: SABRA   EKG Interpretation Date/Time:  Wednesday December 25 2023 14:31:13 EDT Ventricular Rate:  93 PR Interval:    QRS Duration:  104 QT Interval:  390 QTC Calculation: 484 R Axis:   -12  Text Interpretation: Atrial fibrillation Inferior infarct , age undetermined Nonspecific ST and T wave abnormality Abnormal ECG When compared with ECG of  03-Dec-2023 09:44, Vent. rate has decreased Widespread ST/T abnormality has improved Confirmed by Berlyn Saylor [phone]) on 12/25/2023 2:38:39 PM    TTE (12/04/2023): Normal LV size with mild LVH.  LVEF 45-50% with global hypokinesis.  Indeterminate diastolic parameters.  Normal RV size and function.  Moderate left atrial enlargement.  No pericardial effusion.  Aortic sclerosis without stenosis.  Normal CVP.  Risk Assessment/Calculations:   {Does this patient have ATRIAL FIBRILLATION?:[id number]}         Physical Exam:   VS:  BP 112/68 (BP Location: Left Arm, Patient Position: Sitting, Cuff Size: Normal)   Pulse 93   Ht 5' 10 (1.778 m)   Wt 208 lb 9.6 oz (94.6 kg)   SpO2 98%   BMI 29.93 kg/m    Wt Readings from Last 3 Encounters:  12/25/23 208 lb 9.6 oz (94.6 kg)  12/25/23 207 lb 1.9 oz (93.9 kg)  12/03/23 215 lb (97.5 kg)    General:  NAD. Neck: No JVD or HJR. Lungs: Clear to auscultation bilaterally without wheezes or crackles. Heart: Regular rate and rhythm without murmurs, rubs, or gallops. Abdomen: Soft, nontender, nondistended. Extremities: No lower extremity edema.  ASSESSMENT AND PLAN: .    ***    {Are you ordering a CV Procedure (e.g. stress test, cath, DCCV, TEE, etc)?   Press F2        :[id number]}  Dispo: ***  Signed, Lonni Hanson, MD

## 2023-12-26 ENCOUNTER — Encounter: Payer: Self-pay | Admitting: Hematology & Oncology

## 2023-12-26 ENCOUNTER — Other Ambulatory Visit: Payer: Self-pay | Admitting: *Deleted

## 2023-12-26 ENCOUNTER — Other Ambulatory Visit: Payer: Self-pay

## 2023-12-26 ENCOUNTER — Telehealth: Payer: Self-pay

## 2023-12-26 MED ORDER — FOLIC ACID 1 MG PO TABS: 2.0000 mg | ORAL_TABLET | Freq: Every day | ORAL | 1 refills | Status: DC

## 2023-12-26 NOTE — Progress Notes (Signed)
 Per Dr. Jessy office visit note from 12/25/23 the Lonsurf  was discontinued due to severe pancytopenia. Disenrolling from Specialty Pharmacy services at this time.

## 2023-12-26 NOTE — Telephone Encounter (Signed)
 Copied from CRM #[id number]. Topic: Appointments - Scheduling Inquiry for Clinic >> Dec 26, 2023  8:11 AM Cherylann RAMAN wrote: Reason for CRM: Kelby Holley Redman, spoke with Harlene An and she stated that she was willing to take the patient as her own and become his PCP. Please contact Pam, patient of Harlene An, to schedule appt for patient at [phone] with Harlene An decision.   Patient has made an appointment

## 2023-12-27 ENCOUNTER — Encounter: Payer: Self-pay | Admitting: Internal Medicine

## 2023-12-27 DIAGNOSIS — I5020 Unspecified systolic (congestive) heart failure: Secondary | ICD-10-CM | POA: Insufficient documentation

## 2023-12-31 ENCOUNTER — Institutional Professional Consult (permissible substitution): Admitting: Cardiology

## 2024-01-02 ENCOUNTER — Encounter (HOSPITAL_COMMUNITY): Admission: RE | Admit: 2024-01-02 | Source: Ambulatory Visit

## 2024-01-06 ENCOUNTER — Ambulatory Visit (HOSPITAL_COMMUNITY)
Admission: RE | Admit: 2024-01-06 | Discharge: 2024-01-06 | Disposition: A | Discharge status: Home / Self Care | Source: Ambulatory Visit | Attending: Hematology & Oncology | Admitting: Hematology & Oncology

## 2024-01-06 DIAGNOSIS — C20 Malignant neoplasm of rectum: Secondary | ICD-10-CM | POA: Diagnosis present

## 2024-01-06 LAB — GLUCOSE, CAPILLARY: Glucose-Capillary: 108 mg/dL — ABNORMAL HIGH (ref 70–99)

## 2024-01-06 MED ORDER — FLUDEOXYGLUCOSE F - 18 (FDG) INJECTION
10.3800 | Freq: Once | INTRAVENOUS | Status: AC | PRN
Start: 2024-01-06 — End: 2024-01-06
  Administered 2024-01-06: 10.38 via INTRAVENOUS

## 2024-01-10 ENCOUNTER — Encounter: Payer: Self-pay | Admitting: Hematology & Oncology

## 2024-01-11 ENCOUNTER — Encounter: Payer: Self-pay | Admitting: Hematology & Oncology

## 2024-01-13 ENCOUNTER — Other Ambulatory Visit: Payer: Self-pay

## 2024-01-13 MED ORDER — HYDROCODONE-ACETAMINOPHEN 5-325 MG PO TABS: 1.0000 | ORAL_TABLET | Freq: Four times a day (QID) | ORAL | 0 refills | Status: AC | PRN

## 2024-01-16 ENCOUNTER — Other Ambulatory Visit (HOSPITAL_COMMUNITY)

## 2024-01-18 ENCOUNTER — Other Ambulatory Visit: Payer: Self-pay | Admitting: Hematology & Oncology

## 2024-01-20 ENCOUNTER — Encounter: Payer: Self-pay | Admitting: Hematology & Oncology

## 2024-01-21 ENCOUNTER — Inpatient Hospital Stay: Admitting: Hematology & Oncology

## 2024-01-21 ENCOUNTER — Encounter: Payer: Self-pay | Admitting: Hematology & Oncology

## 2024-01-21 ENCOUNTER — Inpatient Hospital Stay

## 2024-01-21 ENCOUNTER — Inpatient Hospital Stay: Attending: Hematology & Oncology

## 2024-01-21 VITALS — BP 143/79 | HR 85 | Temp 98.4°F | Resp 18 | Ht 70.0 in | Wt 207.0 lb

## 2024-01-21 DIAGNOSIS — Z7901 Long term (current) use of anticoagulants: Secondary | ICD-10-CM | POA: Diagnosis not present

## 2024-01-21 DIAGNOSIS — Z8673 Personal history of transient ischemic attack (TIA), and cerebral infarction without residual deficits: Secondary | ICD-10-CM | POA: Insufficient documentation

## 2024-01-21 DIAGNOSIS — C20 Malignant neoplasm of rectum: Secondary | ICD-10-CM | POA: Diagnosis present

## 2024-01-21 DIAGNOSIS — I4891 Unspecified atrial fibrillation: Secondary | ICD-10-CM | POA: Insufficient documentation

## 2024-01-21 DIAGNOSIS — Z7982 Long term (current) use of aspirin: Secondary | ICD-10-CM | POA: Diagnosis not present

## 2024-01-21 DIAGNOSIS — Z5111 Encounter for antineoplastic chemotherapy: Secondary | ICD-10-CM | POA: Diagnosis present

## 2024-01-21 LAB — CBC WITH DIFFERENTIAL (CANCER CENTER ONLY)
Abs Immature Granulocytes: 0.09 K/uL — ABNORMAL HIGH (ref 0.00–0.07)
Basophils Absolute: 0 K/uL (ref 0.0–0.1)
Basophils Relative: 1 %
Eosinophils Absolute: 0.1 K/uL (ref 0.0–0.5)
Eosinophils Relative: 1 %
HCT: 29 % — ABNORMAL LOW (ref 39.0–52.0)
Hemoglobin: 10 g/dL — ABNORMAL LOW (ref 13.0–17.0)
Immature Granulocytes: 2 %
Lymphocytes Relative: 14 %
Lymphs Abs: 0.8 K/uL (ref 0.7–4.0)
MCH: 31.6 pg (ref 26.0–34.0)
MCHC: 34.5 g/dL (ref 30.0–36.0)
MCV: 91.8 fL (ref 80.0–100.0)
Monocytes Absolute: 0.3 K/uL (ref 0.1–1.0)
Monocytes Relative: 5 %
Neutro Abs: 4.5 K/uL (ref 1.7–7.7)
Neutrophils Relative %: 77 %
Platelet Count: 179 K/uL (ref 150–400)
RBC: 3.16 MIL/uL — ABNORMAL LOW (ref 4.22–5.81)
RDW: 18.7 % — ABNORMAL HIGH (ref 11.5–15.5)
WBC Count: 5.8 K/uL (ref 4.0–10.5)
nRBC: 0 % (ref 0.0–0.2)

## 2024-01-21 LAB — CMP (CANCER CENTER ONLY)
ALT: 13 U/L (ref 0–44)
AST: 17 U/L (ref 15–41)
Albumin: 4.4 g/dL (ref 3.5–5.0)
Alkaline Phosphatase: 52 U/L (ref 38–126)
Anion gap: 10 (ref 5–15)
BUN: 18 mg/dL (ref 8–23)
CO2: 27 mmol/L (ref 22–32)
Calcium: 9.1 mg/dL (ref 8.9–10.3)
Chloride: 105 mmol/L (ref 98–111)
Creatinine: 0.93 mg/dL (ref 0.61–1.24)
GFR, Estimated: >60 mL/min (ref >60)
Glucose, Bld: 153 mg/dL — ABNORMAL HIGH (ref 70–99)
Potassium: 3.6 mmol/L (ref 3.5–5.1)
Sodium: 141 mmol/L (ref 135–145)
Total Bilirubin: 1.4 mg/dL — ABNORMAL HIGH (ref 0.0–1.2)
Total Protein: 6.6 g/dL (ref 6.5–8.1)

## 2024-01-21 LAB — RETICULOCYTES
Immature Retic Fract: 19.2 % — ABNORMAL HIGH (ref 2.3–15.9)
RBC.: 3.18 MIL/uL — ABNORMAL LOW (ref 4.22–5.81)
Retic Count, Absolute: 146.6 K/uL (ref 19.0–186.0)
Retic Ct Pct: 4.6 % — ABNORMAL HIGH (ref 0.4–3.1)

## 2024-01-21 LAB — FERRITIN: Ferritin: 1019 ng/mL — ABNORMAL HIGH (ref 24–336)

## 2024-01-21 LAB — IRON AND IRON BINDING CAPACITY (CC-WL,HP ONLY)
Iron: 75 ug/dL (ref 45–182)
Saturation Ratios: 30 % (ref 17.9–39.5)
TIBC: 251 ug/dL (ref 250–450)
UIBC: 176 ug/dL

## 2024-01-21 LAB — CEA (ACCESS): CEA (CHCC): 1.2 ng/mL (ref 0.00–5.00)

## 2024-01-21 NOTE — Progress Notes (Signed)
 DISCONTINUE ON PATHWAY REGIMEN - Colorectal     A cycle is every 14 days:     Panitumumab       Oxaliplatin       Leucovorin       Fluorouracil       Fluorouracil    **Always confirm dose/schedule in your pharmacy ordering system**  PRIOR TREATMENT: COS75: mFOLFOX + Panitumumab  q14 Days  START OFF PATHWAY REGIMEN - Colorectal   [id number]:FOLFIRI (Leucovorin  IV D1 + Fluorouracil  IV D1/CIV D1,2 + Irinotecan IV D1) q14 Days:   A cycle is every 14 days:     Irinotecan      Leucovorin       Fluorouracil       Fluorouracil    **Always confirm dose/schedule in your pharmacy ordering system**  Patient Characteristics: Distant Metastases, Nonsurgical Candidate, KRAS/NRAS Wild-Type (BRAF V600 Wild-Type/Unknown), Standard Cytotoxic Therapy, Third Line Standard Cytotoxic Therapy, Prior Anti-EGFR Therapy Tumor Location: Rectal Therapeutic Status: Distant Metastases Microsatellite/Mismatch Repair Status: MSS/pMMR BRAF Mutation Status: Wild-Type (no mutation) KRAS/NRAS Mutation Status: Wild-Type (no mutation) Preferred Therapy Approach: Standard Cytotoxic Therapy Standard Cytotoxic Line of Therapy: Third Careers adviser Cytotoxic Therapy Intent of Therapy: Non-Curative / Palliative Intent, Discussed with Patient

## 2024-01-21 NOTE — Patient Instructions (Signed)

## 2024-01-21 NOTE — Progress Notes (Unsigned)
 Hematology and Oncology Follow Up Visit  [name] [id number] 1955-05-17 68 y.o. 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 twice daily Aspirin  81 mg p.o. daily FOLFOX/Vectibix  --start on 03/07/2023; s/p cycle #8 - d/c on 07/30/2023 Xgeva  120 mg subcu every 3 months-start 03/07/2023 - patient declined any further Xgeva . Lonsurf  60 mg po BID - start on 08/05/2023 -- changed on 09/20/2023 -- d/c on 11/25/2023 FOLFIRI-start cycle 1 on 01/29/2024                           Interim History:  John Douglas is here today for follow-up.  He is doing better.  He is feeling better.  We did do a PET scan on him.  This was done on 01/06/2024.  The PET scan showed some uptake in the rectum.  There is some increased metabolic activity in a partially calcified aortocaval lymph node.  There was some activity associated with the L5 vertebral body.  He has already had radiotherapy to the L5 vertebral region.  His last CEA back in June was 1.32.  He has had a little bit of back discomfort.  He has had no problems with cough or shortness of breath.  He has had no problems with bowels or bladder.  He has had no issues with fever sweats or chills.  He is on no leg swelling.  He has had no headache.  I do think we are probably going to have to think about treating him.  Again, he has activity around the L5 vertebral body.  He has already had radiotherapy to this area.  There is some activity in the rectum.  He has never had  FOLFIRI.  I think this would be a very reasonable protocol for him.  I had a long talk with him about this.  I explained that he may lose his hair.  This really does not bother him.  I am happy that his blood counts are better.  He was hospitalized with severe pancytopenia which could have been from the Lonsurf .  I really thought that he had GI bleeding as his hemoglobin was down to 3.  He never was found to have any count of bleeding.  Again, he has been off treatment for a little bit.  I think he really has had to be off treatment in order to recover.  Overall, I would have said that his performance status is probably ECOG 1.   Medications:  Allergies as of 01/21/2024       Reactions   Aleve [naproxen] Other (See Comments)   Told to avoid by MD   Celexa  [citalopram ] Other (See Comments)   Contraindicated with A-Fib   Glucophage [metformin] Diarrhea, Other (See Comments)   GI bleeding Blood stool   Motrin [ibuprofen] Other (See Comments)   Told to avoid by MD   Yellow Jacket Venom [bee Venom] Swelling, Other (See Comments)   Severe swelling where stung   Penicillins Hives   Xgeva  [denosumab ] Other (See Comments)   Runny nose and  epistaxis  Lower back pain Watery discharge from eyes Raw sinuses        Medication List        Accurate as of January 21, 2024 12:02 PM. If you have any questions, ask your nurse or doctor.          STOP taking these medications    Lonsurf  15-6.14 MG tablet Generic drug: trifluridine -tipiracil  Stopped by: Chrissi Crow R Adis Sturgill   metoprolol  tartrate 50 MG tablet Commonly known as: LOPRESSOR  Stopped by: Sujata Maines R Severn Goddard       TAKE these medications    acetaminophen  500 MG tablet Commonly known as: TYLENOL  Take 500-1,000 mg by mouth 2 (two) times daily as needed for moderate pain (pain score 4-6), fever or headache.   Eliquis  5 MG Tabs tablet Generic drug: apixaban  TAKE 1 TABLET BY MOUTH TWICE A DAY   ezetimibe  10 MG tablet Commonly  known as: ZETIA  Take 1 tablet by mouth once daily   fluticasone  50 MCG/ACT nasal spray Commonly known as: Flonase  Place 1 spray into both nostrils 2 (two) times daily as needed (sinus congestion).   folic acid  1 MG tablet Commonly known as: FOLVITE  TAKE 2 TABLETS BY MOUTH EVERY DAY   furosemide  20 MG tablet Commonly known as: LASIX  Take 1 tablet (20 mg total) by mouth daily.   glimepiride  1 MG tablet Commonly known as: AMARYL  Take 1 mg by mouth daily.   HYDROcodone -acetaminophen  5-325 MG tablet Commonly known as: NORCO/VICODIN Take 1 tablet by mouth every 6 (six) hours as needed for up to 8 days for moderate pain (pain score 4-6).   metoprolol  succinate 50 MG 24 hr tablet Commonly known as: TOPROL -XL Take 50 mg by mouth daily.   pantoprazole  40 MG tablet Commonly known as: PROTONIX  Take 1 tablet (40 mg total) by mouth daily before breakfast.   potassium chloride  SA 20 MEQ tablet Commonly known as: Klor-Con  M20 Take 1 tablet (20 mEq total) by mouth daily.        Allergies:  Allergies  Allergen Reactions   Aleve [Naproxen] Other (See Comments)    Told to avoid by MD   Celexa  [Citalopram ] Other (See Comments)    Contraindicated with A-Fib   Glucophage [Metformin] Diarrhea and Other (See Comments)    GI bleeding Blood stool   Motrin [Ibuprofen] Other (See Comments)    Told to avoid by MD   Yellow Jacket Venom [Bee Venom] Swelling and Other (See Comments)    Severe swelling where stung   Penicillins Hives   Xgeva  [Denosumab ] Other (See Comments)    Runny nose and epistaxis  Lower back pain Watery discharge from eyes Raw sinuses    Past Medical History, Surgical history, Social history, and Family History were reviewed and updated.  Review of Systems: Review of Systems  Constitutional: Negative.   HENT: Negative.    Eyes: Negative.   Respiratory: Negative.    Cardiovascular: Negative.   Gastrointestinal: Negative.   Genitourinary: Negative.    Musculoskeletal: Negative.   Skin: Negative.   Neurological: Negative.   Endo/Heme/Allergies: Negative.   Psychiatric/Behavioral: Negative.       Physical Exam:  height is 5' 10 (1.778 m) and weight is 207 lb (93.9 kg). His oral temperature is 98.4 F (36.9 C). His blood pressure is 143/79 (abnormal) and his pulse is 85. His respiration is 18 and oxygen saturation is 100%.   Wt Readings from Last 3 Encounters:  01/21/24 207 lb (93.9 kg)  12/25/23 208 lb 9.6 oz (  94.6 kg)  12/25/23 207 lb 1.9 oz (93.9 kg)    Physical Exam Vitals reviewed.  HENT:     Head: Normocephalic and atraumatic.  Eyes:     Pupils: Pupils are equal, round, and reactive to light.  Cardiovascular:     Rate and Rhythm: Normal rate and regular rhythm.     Heart sounds: Normal heart sounds.  Pulmonary:     Effort: Pulmonary effort is normal.     Breath sounds: Normal breath sounds.  Abdominal:     General: Bowel sounds are normal.     Palpations: Abdomen is soft.  Musculoskeletal:        General: No tenderness or deformity. Normal range of motion.     Cervical back: Normal range of motion.  Lymphadenopathy:     Cervical: No cervical adenopathy.  Skin:    General: Skin is warm and dry.     Findings: No erythema or rash.  Neurological:     Mental Status: He is alert and oriented to person, place, and time.  Psychiatric:        Behavior: Behavior normal.        Thought Content: Thought content normal.        Judgment: Judgment normal.     Lab Results  Component Value Date   WBC 5.8 01/21/2024   HGB 10.0 (L) 01/21/2024   HCT 29.0 (L) 01/21/2024   MCV 91.8 01/21/2024   PLT 179 01/21/2024   Lab Results  Component Value Date   FERRITIN 1,300 (H) 12/10/2023   IRON 49 12/10/2023   TIBC 259 12/10/2023   UIBC 210 12/10/2023   IRONPCTSAT 19 12/10/2023   Lab Results  Component Value Date   RETICCTPCT 4.6 (H) 01/21/2024   RBC 3.18 (L) 01/21/2024   RBC 3.16 (L) 01/21/2024   No results found  for: KPAFRELGTCHN, LAMBDASER, KAPLAMBRATIO No results found for: IGGSERUM, IGA, IGMSERUM No results found for: STEPHANY CARLOTA BENSON MARKEL EARLA JOANNIE DOC VICK, SPEI   Chemistry      Component Value Date/Time   NA 141 01/21/2024 1033   NA 140 08/28/2019 0940   K 3.6 01/21/2024 1033   CL 105 01/21/2024 1033   CO2 27 01/21/2024 1033   BUN 18 01/21/2024 1033   BUN 21 08/28/2019 0940   CREATININE 0.93 01/21/2024 1033      Component Value Date/Time   CALCIUM  9.1 01/21/2024 1033   ALKPHOS 52 01/21/2024 1033   AST 17 01/21/2024 1033   ALT 13 01/21/2024 1033   BILITOT 1.4 (H) 01/21/2024 1033       Impression and Plan: [name] is a pleasant 68 yo caucasian gentleman with history of CVA secondary to atrial fib and rectal bleeding. He was diagnosed with metastatic adenocarcinoma of the rectum -K-ras wild type/BRAF wild-type/HER-2 negative/MMR proficient. He had adenopathy distant to the rectum and now has had disease recurrence.    Again, I will try him on FOLFIRI.  I will do 4 cycles and then we will repeat his PET scan again.  Again we will have to be careful with our dosing.  He really has shown a lot of determination.  He has great support from his family.  He truly is motivated.  Again, we will start treatment next week.  I will plan to see him back myself for second cycle.   Maude JONELLE Crease, MD 9/23/202512:02 PM

## 2024-01-22 ENCOUNTER — Encounter: Payer: Self-pay | Admitting: Hematology & Oncology

## 2024-01-23 LAB — ERYTHROPOIETIN: Erythropoietin: 94.8 m[IU]/mL — ABNORMAL HIGH (ref 2.6–18.5)

## 2024-01-24 ENCOUNTER — Other Ambulatory Visit

## 2024-01-24 ENCOUNTER — Inpatient Hospital Stay

## 2024-01-24 ENCOUNTER — Ambulatory Visit: Admitting: Hematology & Oncology

## 2024-01-24 ENCOUNTER — Encounter: Payer: Self-pay | Admitting: Hematology & Oncology

## 2024-01-28 ENCOUNTER — Inpatient Hospital Stay

## 2024-01-28 VITALS — BP 119/67 | HR 67

## 2024-01-28 DIAGNOSIS — C20 Malignant neoplasm of rectum: Secondary | ICD-10-CM

## 2024-01-28 DIAGNOSIS — Z5111 Encounter for antineoplastic chemotherapy: Secondary | ICD-10-CM | POA: Diagnosis not present

## 2024-01-28 LAB — CMP (CANCER CENTER ONLY)
ALT: 11 U/L (ref 0–44)
AST: 17 U/L (ref 15–41)
Albumin: 4.3 g/dL (ref 3.5–5.0)
Alkaline Phosphatase: 49 U/L (ref 38–126)
Anion gap: 11 (ref 5–15)
BUN: 18 mg/dL (ref 8–23)
CO2: 25 mmol/L (ref 22–32)
Calcium: 9.2 mg/dL (ref 8.9–10.3)
Chloride: 105 mmol/L (ref 98–111)
Creatinine: 0.99 mg/dL (ref 0.61–1.24)
GFR, Estimated: >60 mL/min (ref >60)
Glucose, Bld: 99 mg/dL (ref 70–99)
Potassium: 3.7 mmol/L (ref 3.5–5.1)
Sodium: 142 mmol/L (ref 135–145)
Total Bilirubin: 1.4 mg/dL — ABNORMAL HIGH (ref 0.0–1.2)
Total Protein: 6.9 g/dL (ref 6.5–8.1)

## 2024-01-28 LAB — CBC WITH DIFFERENTIAL (CANCER CENTER ONLY)
Abs Immature Granulocytes: 0.04 K/uL (ref 0.00–0.07)
Basophils Absolute: 0 K/uL (ref 0.0–0.1)
Basophils Relative: 0 %
Eosinophils Absolute: 0.1 K/uL (ref 0.0–0.5)
Eosinophils Relative: 3 %
HCT: 30.3 % — ABNORMAL LOW (ref 39.0–52.0)
Hemoglobin: 10.2 g/dL — ABNORMAL LOW (ref 13.0–17.0)
Immature Granulocytes: 1 %
Lymphocytes Relative: 15 %
Lymphs Abs: 0.8 K/uL (ref 0.7–4.0)
MCH: 31.3 pg (ref 26.0–34.0)
MCHC: 33.7 g/dL (ref 30.0–36.0)
MCV: 92.9 fL (ref 80.0–100.0)
Monocytes Absolute: 0.4 K/uL (ref 0.1–1.0)
Monocytes Relative: 7 %
Neutro Abs: 3.9 K/uL (ref 1.7–7.7)
Neutrophils Relative %: 74 %
Platelet Count: 166 K/uL (ref 150–400)
RBC: 3.26 MIL/uL — ABNORMAL LOW (ref 4.22–5.81)
RDW: 18.8 % — ABNORMAL HIGH (ref 11.5–15.5)
WBC Count: 5.2 K/uL (ref 4.0–10.5)
nRBC: 0 % (ref 0.0–0.2)

## 2024-01-28 MED ORDER — FLUOROURACIL CHEMO INJECTION 2.5 GM/50ML
400.0000 mg/m2 | Freq: Once | INTRAVENOUS | Status: AC
  Administered 2024-01-28: 850 mg via INTRAVENOUS
  Filled 2024-01-28: qty 17

## 2024-01-28 MED ORDER — SODIUM CHLORIDE 0.9 % IV SOLN
400.0000 mg/m2 | Freq: Once | INTRAVENOUS | Status: AC
  Administered 2024-01-28: 860 mg via INTRAVENOUS
  Filled 2024-01-28: qty 43

## 2024-01-28 MED ORDER — DEXAMETHASONE SODIUM PHOSPHATE 10 MG/ML IJ SOLN
10.0000 mg | Freq: Once | INTRAMUSCULAR | Status: AC
  Administered 2024-01-28: 10 mg via INTRAVENOUS
  Filled 2024-01-28: qty 1

## 2024-01-28 MED ORDER — SODIUM CHLORIDE 0.9 % IV SOLN
144.0000 mg/m2 | Freq: Once | INTRAVENOUS | Status: AC
  Administered 2024-01-28: 300 mg via INTRAVENOUS
  Filled 2024-01-28: qty 15

## 2024-01-28 MED ORDER — ATROPINE SULFATE 1 MG/ML IV SOLN
0.5000 mg | Freq: Once | INTRAVENOUS | Status: AC | PRN
  Administered 2024-01-28: 0.5 mg via INTRAVENOUS
  Filled 2024-01-28: qty 1

## 2024-01-28 MED ORDER — SODIUM CHLORIDE 0.9 % IV SOLN
1920.0000 mg/m2 | INTRAVENOUS | Status: DC
  Administered 2024-01-28: 4150 mg via INTRAVENOUS
  Filled 2024-01-28: qty 83

## 2024-01-28 MED ORDER — SODIUM CHLORIDE 0.9 % IV SOLN: INTRAVENOUS | Status: DC

## 2024-01-28 MED ORDER — PALONOSETRON HCL INJECTION 0.25 MG/5ML
0.2500 mg | Freq: Once | INTRAVENOUS | Status: AC
  Administered 2024-01-28: 0.25 mg via INTRAVENOUS
  Filled 2024-01-28: qty 5

## 2024-01-28 NOTE — Patient Instructions (Signed)

## 2024-01-28 NOTE — Progress Notes (Signed)
 Pharmacist Chemotherapy Monitoring - Initial Assessment    Anticipated start date: 01/28/24   The following has been reviewed per standard work regarding the patient's treatment regimen: The patient's diagnosis, treatment plan and drug doses, and organ/hematologic function Lab orders and baseline tests specific to treatment regimen  The treatment plan start date, drug sequencing, and pre-medications Prior authorization status  Patient's documented medication list, including drug-drug interaction screen and prescriptions for anti-emetics and supportive care specific to the treatment regimen The drug concentrations, fluid compatibility, administration routes, and timing of the medications to be used The patient's access for treatment and lifetime cumulative dose history, if applicable  The patient's medication allergies and previous infusion related reactions, if applicable   Changes made to treatment plan:  N/A  Follow up needed:  N/A   Norleen JAYSON Sou, RPH, 01/28/2024  11:13 AM

## 2024-01-30 ENCOUNTER — Inpatient Hospital Stay: Attending: Hematology & Oncology

## 2024-01-30 DIAGNOSIS — D701 Agranulocytosis secondary to cancer chemotherapy: Secondary | ICD-10-CM | POA: Insufficient documentation

## 2024-01-30 DIAGNOSIS — Z7901 Long term (current) use of anticoagulants: Secondary | ICD-10-CM | POA: Insufficient documentation

## 2024-01-30 DIAGNOSIS — D6481 Anemia due to antineoplastic chemotherapy: Secondary | ICD-10-CM | POA: Insufficient documentation

## 2024-01-30 DIAGNOSIS — Z8673 Personal history of transient ischemic attack (TIA), and cerebral infarction without residual deficits: Secondary | ICD-10-CM | POA: Insufficient documentation

## 2024-01-30 DIAGNOSIS — C20 Malignant neoplasm of rectum: Secondary | ICD-10-CM | POA: Insufficient documentation

## 2024-01-30 DIAGNOSIS — Z5111 Encounter for antineoplastic chemotherapy: Secondary | ICD-10-CM | POA: Insufficient documentation

## 2024-01-30 DIAGNOSIS — Z7982 Long term (current) use of aspirin: Secondary | ICD-10-CM | POA: Insufficient documentation

## 2024-01-30 DIAGNOSIS — D6959 Other secondary thrombocytopenia: Secondary | ICD-10-CM | POA: Insufficient documentation

## 2024-01-30 DIAGNOSIS — I4891 Unspecified atrial fibrillation: Secondary | ICD-10-CM | POA: Insufficient documentation

## 2024-02-11 ENCOUNTER — Encounter: Payer: Self-pay | Admitting: Medical Oncology

## 2024-02-11 ENCOUNTER — Inpatient Hospital Stay

## 2024-02-11 ENCOUNTER — Other Ambulatory Visit: Payer: Self-pay | Admitting: *Deleted

## 2024-02-11 ENCOUNTER — Inpatient Hospital Stay (HOSPITAL_BASED_OUTPATIENT_CLINIC_OR_DEPARTMENT_OTHER): Admitting: Medical Oncology

## 2024-02-11 VITALS — BP 99/64 | HR 81 | Temp 98.3°F | Resp 18 | Ht 70.0 in | Wt 210.0 lb

## 2024-02-11 DIAGNOSIS — T451X5A Adverse effect of antineoplastic and immunosuppressive drugs, initial encounter: Secondary | ICD-10-CM

## 2024-02-11 DIAGNOSIS — D701 Agranulocytosis secondary to cancer chemotherapy: Secondary | ICD-10-CM

## 2024-02-11 DIAGNOSIS — Z95828 Presence of other vascular implants and grafts: Secondary | ICD-10-CM | POA: Diagnosis not present

## 2024-02-11 DIAGNOSIS — D500 Iron deficiency anemia secondary to blood loss (chronic): Secondary | ICD-10-CM

## 2024-02-11 DIAGNOSIS — D6481 Anemia due to antineoplastic chemotherapy: Secondary | ICD-10-CM | POA: Diagnosis not present

## 2024-02-11 DIAGNOSIS — D6959 Other secondary thrombocytopenia: Secondary | ICD-10-CM | POA: Diagnosis not present

## 2024-02-11 DIAGNOSIS — C20 Malignant neoplasm of rectum: Secondary | ICD-10-CM

## 2024-02-11 DIAGNOSIS — I4891 Unspecified atrial fibrillation: Secondary | ICD-10-CM | POA: Diagnosis not present

## 2024-02-11 DIAGNOSIS — Z7901 Long term (current) use of anticoagulants: Secondary | ICD-10-CM | POA: Diagnosis not present

## 2024-02-11 DIAGNOSIS — Z8673 Personal history of transient ischemic attack (TIA), and cerebral infarction without residual deficits: Secondary | ICD-10-CM | POA: Diagnosis not present

## 2024-02-11 DIAGNOSIS — Z5111 Encounter for antineoplastic chemotherapy: Secondary | ICD-10-CM | POA: Diagnosis present

## 2024-02-11 DIAGNOSIS — Z7982 Long term (current) use of aspirin: Secondary | ICD-10-CM | POA: Diagnosis not present

## 2024-02-11 LAB — RETICULOCYTES
Immature Retic Fract: 16.4 % — ABNORMAL HIGH (ref 2.3–15.9)
RBC.: 2.62 MIL/uL — ABNORMAL LOW (ref 4.22–5.81)
Retic Count, Absolute: 126.3 K/uL (ref 19.0–186.0)
Retic Ct Pct: 4.8 % — ABNORMAL HIGH (ref 0.4–3.1)

## 2024-02-11 LAB — CMP (CANCER CENTER ONLY)
ALT: 13 U/L (ref 0–44)
AST: 19 U/L (ref 15–41)
Albumin: 4.2 g/dL (ref 3.5–5.0)
Alkaline Phosphatase: 54 U/L (ref 38–126)
Anion gap: 10 (ref 5–15)
BUN: 16 mg/dL (ref 8–23)
CO2: 27 mmol/L (ref 22–32)
Calcium: 9.2 mg/dL (ref 8.9–10.3)
Chloride: 106 mmol/L (ref 98–111)
Creatinine: 0.98 mg/dL (ref 0.61–1.24)
GFR, Estimated: >60 mL/min (ref >60)
Glucose, Bld: 126 mg/dL — ABNORMAL HIGH (ref 70–99)
Potassium: 3.6 mmol/L (ref 3.5–5.1)
Sodium: 143 mmol/L (ref 135–145)
Total Bilirubin: 1.5 mg/dL — ABNORMAL HIGH (ref 0.0–1.2)
Total Protein: 6.4 g/dL — ABNORMAL LOW (ref 6.5–8.1)

## 2024-02-11 LAB — CEA (ACCESS): CEA (CHCC): 1.04 ng/mL (ref 0.00–5.00)

## 2024-02-11 LAB — CBC WITH DIFFERENTIAL (CANCER CENTER ONLY)
Abs Immature Granulocytes: 0.03 K/uL (ref 0.00–0.07)
Basophils Absolute: 0 K/uL (ref 0.0–0.1)
Basophils Relative: 0 %
Eosinophils Absolute: 0.1 K/uL (ref 0.0–0.5)
Eosinophils Relative: 2 %
HCT: 24.9 % — ABNORMAL LOW (ref 39.0–52.0)
Hemoglobin: 8.3 g/dL — ABNORMAL LOW (ref 13.0–17.0)
Immature Granulocytes: 1 %
Lymphocytes Relative: 23 %
Lymphs Abs: 0.7 K/uL (ref 0.7–4.0)
MCH: 31.7 pg (ref 26.0–34.0)
MCHC: 33.3 g/dL (ref 30.0–36.0)
MCV: 95 fL (ref 80.0–100.0)
Monocytes Absolute: 0.3 K/uL (ref 0.1–1.0)
Monocytes Relative: 8 %
Neutro Abs: 2 K/uL (ref 1.7–7.7)
Neutrophils Relative %: 66 %
Platelet Count: 137 K/uL — ABNORMAL LOW (ref 150–400)
RBC: 2.62 MIL/uL — ABNORMAL LOW (ref 4.22–5.81)
RDW: 21.2 % — ABNORMAL HIGH (ref 11.5–15.5)
WBC Count: 3.1 K/uL — ABNORMAL LOW (ref 4.0–10.5)
nRBC: 0 % (ref 0.0–0.2)

## 2024-02-11 LAB — PREPARE RBC (CROSSMATCH)

## 2024-02-11 LAB — FERRITIN: Ferritin: 950 ng/mL — ABNORMAL HIGH (ref 24–336)

## 2024-02-11 LAB — IRON AND IRON BINDING CAPACITY (CC-WL,HP ONLY)
Iron: 94 ug/dL (ref 45–182)
Saturation Ratios: 35 % (ref 17.9–39.5)
TIBC: 266 ug/dL (ref 250–450)
UIBC: 172 ug/dL

## 2024-02-11 NOTE — Patient Instructions (Signed)

## 2024-02-11 NOTE — Progress Notes (Signed)
 Hematology and Oncology Follow Up Visit  Francisca Harbuck [id number] Apr 05, 1956 68 y.o. 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 twice daily Aspirin  81 mg p.o. daily FOLFOX/Vectibix  --start on 03/07/2023; s/p cycle #8 - d/c on 07/30/2023 Xgeva  120 mg subcu every 3 months-start 03/07/2023 - patient declined any further Xgeva . Lonsurf  60 mg po BID - start on 08/05/2023 -- changed on 09/20/2023 -- d/c on 11/25/2023 FOLFIRI-start cycle 1 on 01/29/2024                           Interim History:  Mr. Woodmansee is here today for follow-up.    He reports that he tolerated it well. He does have some fatigue today but reports that he did not sleep well.   PET on 01/06/2024 showed some uptake in the rectum.  There is some increased metabolic activity in a partially calcified aortocaval lymph node.  There was some activity associated with the L5 vertebral body.Given this his plan was changed from FOLFOX/Vectibix  to FOLFIRI. He had his first cycle of this on 01/28/2024 and is here today for consideration of cycle 2.  He has already had radiotherapy to the L5 vertebral region.  His last CEA back in June was 1.32.  He has had no problems with cough or shortness of breath.  He has had no problems with bowels or bladder.  He has had no issues with fever sweats or chills.  He is on no leg swelling.  He has had no headache.  Of note, he was hospitalized with severe pancytopenia which could have been from the Lonsurf .  I  really thought that he had GI bleeding as his hemoglobin was down to 3.  He never was found to have any count of bleeding with GI evaluation.   Overall, I would have said that his performance status is probably ECOG 1.   Wt Readings from Last 3 Encounters:  02/11/24 210 lb (95.3 kg)  01/21/24 207 lb (93.9 kg)  12/25/23 208 lb 9.6 oz (94.6 kg)   Medications:  Allergies as of 02/11/2024       Reactions   Aleve [naproxen] Other (See Comments)   Told to avoid by MD   Celexa  [citalopram ] Other (See Comments)   Contraindicated with A-Fib   Glucophage [metformin] Diarrhea, Other (See Comments)   GI bleeding Blood stool   Motrin [ibuprofen] Other (See Comments)   Told to avoid by MD   Yellow Jacket Venom [bee Venom] Swelling, Other (See Comments)   Severe swelling where stung   Penicillins Hives   Xgeva  [denosumab ] Other (See Comments)   Runny nose and epistaxis  Lower back pain Watery discharge from eyes Raw sinuses        Medication List        Accurate as of February 11, 2024  1:21 PM. If you have any questions, ask your nurse or doctor.          acetaminophen  500 MG tablet Commonly known as: TYLENOL  Take 500-1,000 mg by mouth 2 (two) times  daily as needed for moderate pain (pain score 4-6), fever or headache.   Eliquis  5 MG Tabs tablet Generic drug: apixaban  TAKE 1 TABLET BY MOUTH TWICE A DAY   ezetimibe  10 MG tablet Commonly known as: ZETIA  Take 1 tablet by mouth once daily   fluticasone  50 MCG/ACT nasal spray Commonly known as: Flonase  Place 1 spray into both nostrils 2 (two) times daily as needed (sinus congestion).   folic acid  1 MG tablet Commonly known as: FOLVITE  TAKE 2 TABLETS BY MOUTH EVERY DAY   furosemide  20 MG tablet Commonly known as: LASIX  Take 1 tablet (20 mg total) by mouth daily.   glimepiride  1 MG tablet Commonly known as: AMARYL  Take 1 mg by mouth daily.   HYDROcodone -acetaminophen  5-325 MG tablet Commonly known as:  NORCO/VICODIN Take 1 tablet by mouth every 6 (six) hours as needed.   metoprolol  succinate 50 MG 24 hr tablet Commonly known as: TOPROL -XL Take 50 mg by mouth daily.   pantoprazole  40 MG tablet Commonly known as: PROTONIX  Take 1 tablet (40 mg total) by mouth daily before breakfast.   potassium chloride  SA 20 MEQ tablet Commonly known as: Klor-Con  M20 Take 1 tablet (20 mEq total) by mouth daily.        Allergies:  Allergies  Allergen Reactions   Aleve [Naproxen] Other (See Comments)    Told to avoid by MD   Celexa  [Citalopram ] Other (See Comments)    Contraindicated with A-Fib   Glucophage [Metformin] Diarrhea and Other (See Comments)    GI bleeding Blood stool   Motrin [Ibuprofen] Other (See Comments)    Told to avoid by MD   Yellow Jacket Venom [Bee Venom] Swelling and Other (See Comments)    Severe swelling where stung   Penicillins Hives   Xgeva  [Denosumab ] Other (See Comments)    Runny nose and epistaxis  Lower back pain Watery discharge from eyes Raw sinuses    Past Medical History, Surgical history, Social history, and Family History were reviewed and updated.  Review of Systems: Review of Systems  Constitutional: Negative.   HENT: Negative.    Eyes: Negative.   Respiratory: Negative.    Cardiovascular: Negative.   Gastrointestinal: Negative.   Genitourinary: Negative.   Musculoskeletal: Negative.   Skin: Negative.   Neurological: Negative.   Endo/Heme/Allergies: Negative.   Psychiatric/Behavioral: Negative.       Physical Exam:  height is 5' 10 (1.778 m) and weight is 210 lb (95.3 kg). His oral temperature is 98.3 F (36.8 C). His blood pressure is 99/64 and his pulse is 81. His respiration is 18 and oxygen saturation is 100%.   Wt Readings from Last 3 Encounters:  02/11/24 210 lb (95.3 kg)  01/21/24 207 lb (93.9 kg)  12/25/23 208 lb 9.6 oz (94.6 kg)    Physical Exam Vitals reviewed.  HENT:     Head: Normocephalic and atraumatic.   Eyes:     Pupils: Pupils are equal, round, and reactive to light.  Cardiovascular:     Rate and Rhythm: Normal rate and regular rhythm.     Heart sounds: Normal heart sounds.  Pulmonary:     Effort: Pulmonary effort is normal.     Breath sounds: Normal breath sounds.  Abdominal:     General: Bowel sounds are normal.     Palpations: Abdomen is soft.  Musculoskeletal:        General: No tenderness or deformity. Normal range of motion.     Cervical back: Normal range of  motion.  Lymphadenopathy:     Cervical: No cervical adenopathy.  Skin:    General: Skin is warm and dry.     Findings: No erythema or rash.  Neurological:     Mental Status: He is alert and oriented to person, place, and time.  Psychiatric:        Behavior: Behavior normal.        Thought Content: Thought content normal.        Judgment: Judgment normal.     Lab Results  Component Value Date   WBC 3.1 (L) 02/11/2024   HGB 8.3 (L) 02/11/2024   HCT 24.9 (L) 02/11/2024   MCV 95.0 02/11/2024   PLT 137 (L) 02/11/2024   Lab Results  Component Value Date   FERRITIN 1,019 (H) 01/21/2024   IRON 75 01/21/2024   TIBC 251 01/21/2024   UIBC 176 01/21/2024   IRONPCTSAT 30 01/21/2024   Lab Results  Component Value Date   RETICCTPCT 4.8 (H) 02/11/2024   RBC 2.62 (L) 02/11/2024   No results found for: KPAFRELGTCHN, LAMBDASER, KAPLAMBRATIO No results found for: IGGSERUM, IGA, IGMSERUM No results found for: STEPHANY CARLOTA BENSON MARKEL EARLA JOANNIE DOC VICK, SPEI   Chemistry      Component Value Date/Time   NA 143 02/11/2024 0953   NA 140 08/28/2019 0940   K 3.6 02/11/2024 0953   CL 106 02/11/2024 0953   CO2 27 02/11/2024 0953   BUN 16 02/11/2024 0953   BUN 21 08/28/2019 0940   CREATININE 0.98 02/11/2024 0953      Component Value Date/Time   CALCIUM  9.2 02/11/2024 0953   ALKPHOS 54 02/11/2024 0953   AST 19 02/11/2024 0953   ALT 13 02/11/2024 0953   BILITOT  1.5 (H) 02/11/2024 0953     Encounter Diagnoses  Name Primary?   Rectal cancer (HCC) Yes   Port-A-Cath in place    Iron deficiency anemia due to chronic blood loss    Anemia due to antineoplastic chemotherapy    Leukopenia due to antineoplastic chemotherapy    Chemotherapy-induced thrombocytopenia    Impression and Plan: Mr. Tokunaga is a pleasant 68 yo caucasian gentleman with history of CVA secondary to atrial fib and rectal bleeding. He was diagnosed with metastatic adenocarcinoma of the rectum -K-ras wild type/BRAF wild-type/HER-2 negative/MMR proficient. He had adenopathy distant to the rectum and now has had disease recurrence.   He is now on FOLFIRI with a plan to do 4 cycles and then we will repeat his PET scan again.  Given his past severe pancytopenia and CBC results today we have elected to HOLD treatment for 1 week and given him 1 unit of blood this week. Pancytopenia precautions reviewed.   Type/Cross today, de access and BP recheck Cancel pump d/c for this Thursday as we are holding TX RTC Wed or Thursday for 1 unit of blood (not early am per pt) RTC next week MD, port labs, [place]  Lauraine HERO [place], [place] 10/14/20251:21 PM

## 2024-02-12 ENCOUNTER — Encounter: Payer: Self-pay | Admitting: Hematology & Oncology

## 2024-02-13 ENCOUNTER — Inpatient Hospital Stay

## 2024-02-13 ENCOUNTER — Encounter

## 2024-02-13 ENCOUNTER — Other Ambulatory Visit: Payer: Self-pay | Admitting: Hematology & Oncology

## 2024-02-13 DIAGNOSIS — C20 Malignant neoplasm of rectum: Secondary | ICD-10-CM

## 2024-02-13 DIAGNOSIS — T451X5A Adverse effect of antineoplastic and immunosuppressive drugs, initial encounter: Secondary | ICD-10-CM

## 2024-02-13 DIAGNOSIS — Z5111 Encounter for antineoplastic chemotherapy: Secondary | ICD-10-CM | POA: Diagnosis not present

## 2024-02-13 MED ORDER — SODIUM CHLORIDE 0.9% IV SOLUTION
250.0000 mL | INTRAVENOUS | Status: DC
  Administered 2024-02-13: 250 mL via INTRAVENOUS

## 2024-02-13 MED ORDER — DIPHENHYDRAMINE HCL 25 MG PO CAPS
25.0000 mg | ORAL_CAPSULE | Freq: Once | ORAL | Status: AC
  Administered 2024-02-13: 25 mg via ORAL
  Filled 2024-02-13: qty 1

## 2024-02-13 MED ORDER — ACETAMINOPHEN 325 MG PO TABS
650.0000 mg | ORAL_TABLET | Freq: Once | ORAL | Status: AC
  Administered 2024-02-13: 650 mg via ORAL
  Filled 2024-02-13: qty 2

## 2024-02-13 NOTE — Patient Instructions (Signed)

## 2024-02-14 LAB — TYPE AND SCREEN
ABO/RH(D): O POS
Antibody Screen: NEGATIVE
Unit division: 0

## 2024-02-14 LAB — BPAM RBC
Blood Product Expiration Date: 202510242359
ISSUE DATE / TIME: 202510160734
Unit Type and Rh: 5100

## 2024-02-18 ENCOUNTER — Inpatient Hospital Stay

## 2024-02-18 ENCOUNTER — Inpatient Hospital Stay: Admitting: Medical Oncology

## 2024-02-18 ENCOUNTER — Encounter: Payer: Self-pay | Admitting: Medical Oncology

## 2024-02-18 VITALS — BP 149/83 | HR 78 | Temp 98.4°F | Resp 18 | Wt 210.0 lb

## 2024-02-18 DIAGNOSIS — C20 Malignant neoplasm of rectum: Secondary | ICD-10-CM

## 2024-02-18 DIAGNOSIS — D6481 Anemia due to antineoplastic chemotherapy: Secondary | ICD-10-CM

## 2024-02-18 DIAGNOSIS — Z95828 Presence of other vascular implants and grafts: Secondary | ICD-10-CM

## 2024-02-18 DIAGNOSIS — D500 Iron deficiency anemia secondary to blood loss (chronic): Secondary | ICD-10-CM

## 2024-02-18 DIAGNOSIS — Z5111 Encounter for antineoplastic chemotherapy: Secondary | ICD-10-CM | POA: Diagnosis not present

## 2024-02-18 DIAGNOSIS — D701 Agranulocytosis secondary to cancer chemotherapy: Secondary | ICD-10-CM

## 2024-02-18 DIAGNOSIS — T451X5A Adverse effect of antineoplastic and immunosuppressive drugs, initial encounter: Secondary | ICD-10-CM

## 2024-02-18 DIAGNOSIS — D6959 Other secondary thrombocytopenia: Secondary | ICD-10-CM

## 2024-02-18 LAB — CEA (ACCESS): CEA (CHCC): 1.16 ng/mL (ref 0.00–5.00)

## 2024-02-18 LAB — CMP (CANCER CENTER ONLY)
ALT: 14 U/L (ref 0–44)
AST: 20 U/L (ref 15–41)
Albumin: 4.4 g/dL (ref 3.5–5.0)
Alkaline Phosphatase: 57 U/L (ref 38–126)
Anion gap: 11 (ref 5–15)
BUN: 16 mg/dL (ref 8–23)
CO2: 26 mmol/L (ref 22–32)
Calcium: 9 mg/dL (ref 8.9–10.3)
Chloride: 104 mmol/L (ref 98–111)
Creatinine: 1.09 mg/dL (ref 0.61–1.24)
GFR, Estimated: >60 mL/min (ref >60)
Glucose, Bld: 155 mg/dL — ABNORMAL HIGH (ref 70–99)
Potassium: 3.4 mmol/L — ABNORMAL LOW (ref 3.5–5.1)
Sodium: 141 mmol/L (ref 135–145)
Total Bilirubin: 1.6 mg/dL — ABNORMAL HIGH (ref 0.0–1.2)
Total Protein: 6.6 g/dL (ref 6.5–8.1)

## 2024-02-18 LAB — CBC WITH DIFFERENTIAL (CANCER CENTER ONLY)
Abs Immature Granulocytes: 0.06 K/uL (ref 0.00–0.07)
Basophils Absolute: 0 K/uL (ref 0.0–0.1)
Basophils Relative: 1 %
Eosinophils Absolute: 0.1 K/uL (ref 0.0–0.5)
Eosinophils Relative: 2 %
HCT: 30.7 % — ABNORMAL LOW (ref 39.0–52.0)
Hemoglobin: 10.3 g/dL — ABNORMAL LOW (ref 13.0–17.0)
Immature Granulocytes: 2 %
Lymphocytes Relative: 22 %
Lymphs Abs: 0.7 K/uL (ref 0.7–4.0)
MCH: 31.4 pg (ref 26.0–34.0)
MCHC: 33.6 g/dL (ref 30.0–36.0)
MCV: 93.6 fL (ref 80.0–100.0)
Monocytes Absolute: 0.3 K/uL (ref 0.1–1.0)
Monocytes Relative: 9 %
Neutro Abs: 2.2 K/uL (ref 1.7–7.7)
Neutrophils Relative %: 64 %
Platelet Count: 174 K/uL (ref 150–400)
RBC: 3.28 MIL/uL — ABNORMAL LOW (ref 4.22–5.81)
RDW: 20.8 % — ABNORMAL HIGH (ref 11.5–15.5)
WBC Count: 3.4 K/uL — ABNORMAL LOW (ref 4.0–10.5)
nRBC: 0 % (ref 0.0–0.2)

## 2024-02-18 MED ORDER — SODIUM CHLORIDE 0.9 % IV SOLN: INTRAVENOUS | Status: DC

## 2024-02-18 MED ORDER — SODIUM CHLORIDE 0.9 % IV SOLN
400.0000 mg/m2 | Freq: Once | INTRAVENOUS | Status: DC
  Filled 2024-02-18: qty 43

## 2024-02-18 MED ORDER — PALONOSETRON HCL INJECTION 0.25 MG/5ML
0.2500 mg | Freq: Once | INTRAVENOUS | Status: AC
  Administered 2024-02-18: 0.25 mg via INTRAVENOUS
  Filled 2024-02-18: qty 5

## 2024-02-18 MED ORDER — SODIUM CHLORIDE 0.9 % IV SOLN
144.0000 mg/m2 | Freq: Once | INTRAVENOUS | Status: AC
  Administered 2024-02-18: 300 mg via INTRAVENOUS
  Filled 2024-02-18: qty 15

## 2024-02-18 MED ORDER — FLUOROURACIL CHEMO INJECTION 2.5 GM/50ML
400.0000 mg/m2 | Freq: Once | INTRAVENOUS | Status: AC
  Administered 2024-02-18: 850 mg via INTRAVENOUS
  Filled 2024-02-18: qty 17

## 2024-02-18 MED ORDER — SODIUM CHLORIDE 0.9 % IV SOLN
1920.0000 mg/m2 | INTRAVENOUS | Status: DC
  Administered 2024-02-18: 4150 mg via INTRAVENOUS
  Filled 2024-02-18: qty 83

## 2024-02-18 MED ORDER — ATROPINE SULFATE 1 MG/ML IV SOLN
0.5000 mg | Freq: Once | INTRAVENOUS | Status: AC | PRN
  Administered 2024-02-18: 0.5 mg via INTRAVENOUS
  Filled 2024-02-18: qty 1

## 2024-02-18 MED ORDER — SODIUM CHLORIDE 0.9 % IV SOLN
400.0000 mg/m2 | Freq: Once | INTRAVENOUS | Status: AC
  Administered 2024-02-18: 860 mg via INTRAVENOUS
  Filled 2024-02-18: qty 43

## 2024-02-18 MED ORDER — DEXAMETHASONE SOD PHOSPHATE PF 10 MG/ML IJ SOLN
10.0000 mg | Freq: Once | INTRAMUSCULAR | Status: AC
  Administered 2024-02-18: 10 mg via INTRAVENOUS

## 2024-02-18 NOTE — Progress Notes (Signed)
 Hematology and Oncology Follow Up Visit  John Douglas [id number] [birth year] 68 y.o. 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 twice daily Aspirin  81 mg p.o. daily FOLFOX/Vectibix  --start on 03/07/2023; s/p cycle #8 - d/c on 07/30/2023 Xgeva  120 mg subcu every 3 months-start 03/07/2023 - patient declined any further Xgeva . Lonsurf  60 mg po BID - start on 08/05/2023 -- changed on 09/20/2023 -- d/c on 11/25/2023 FOLFIRI-start cycle 1 on 01/29/2024                           Interim History:  John Douglas is here today for follow-up.    Today John Douglas states that John Douglas is well.   PET on 01/06/2024 showed some uptake in the rectum.  There is some increased metabolic activity in a partially calcified aortocaval lymph node.  There was some activity associated with the L5 vertebral body.Given this his plan was changed from FOLFOX/Vectibix  to FOLFIRI. John Douglas had his first cycle of this on 01/28/2024 and is here today for consideration of cycle 2.  John Douglas has already had radiotherapy to the L5 vertebral region. This area causes him bony pain.   His last CEA back in June was 1.32.  John Douglas has had no problems with cough or shortness of breath.  John Douglas has had no problems with bowels or bladder.  John Douglas has had no issues with fever sweats or chills.  John Douglas is on no leg swelling.  John Douglas has had no headache.  Of note, John Douglas was hospitalized with severe pancytopenia which could have been from the Lonsurf .  I really thought that John Douglas had GI bleeding as his  hemoglobin was down to 3.  John Douglas never was found to have any count of bleeding with GI evaluation.   Overall, I would have said that his performance status is probably ECOG 1.   Wt Readings from Last 3 Encounters:  02/18/24 210 lb (95.3 kg)  02/11/24 210 lb (95.3 kg)  01/21/24 207 lb (93.9 kg)   Medications:  Allergies as of 02/18/2024       Reactions   Aleve [naproxen] Other (See Comments)   Told to avoid by MD   Celexa  [citalopram ] Other (See Comments)   Contraindicated with A-Fib   Glucophage [metformin] Diarrhea, Other (See Comments)   GI bleeding Blood stool   Motrin [ibuprofen] Other (See Comments)   Told to avoid by MD   Yellow Jacket Venom [bee Venom] Swelling, Other (See Comments)   Severe swelling where stung   Penicillins Hives   Xgeva  [denosumab ] Other (See Comments)   Runny nose and epistaxis  Lower back pain Watery discharge from eyes Raw sinuses        Medication List        Accurate as of February 18, 2024 10:37 AM. If you have any questions, ask your nurse or doctor.          acetaminophen  500 MG tablet Commonly known as: TYLENOL  Take 500-1,000 mg by mouth 2 (two) times daily as needed for moderate pain (pain score 4-6), fever  or headache.   Eliquis  5 MG Tabs tablet Generic drug: apixaban  TAKE 1 TABLET BY MOUTH TWICE A DAY   ezetimibe  10 MG tablet Commonly known as: ZETIA  Take 1 tablet by mouth once daily   fluticasone  50 MCG/ACT nasal spray Commonly known as: Flonase  Place 1 spray into both nostrils 2 (two) times daily as needed (sinus congestion).   folic acid  1 MG tablet Commonly known as: FOLVITE  TAKE 2 TABLETS BY MOUTH EVERY DAY   furosemide  20 MG tablet Commonly known as: LASIX  Take 1 tablet (20 mg total) by mouth daily.   glimepiride  1 MG tablet Commonly known as: AMARYL  Take 1 mg by mouth daily.   HYDROcodone -acetaminophen  5-325 MG tablet Commonly known as: NORCO/VICODIN Take 1 tablet by mouth every 6 (six) hours as  needed.   metoprolol  succinate 50 MG 24 hr tablet Commonly known as: TOPROL -XL Take 50 mg by mouth daily.   pantoprazole  40 MG tablet Commonly known as: PROTONIX  Take 1 tablet (40 mg total) by mouth daily before breakfast.   potassium chloride  SA 20 MEQ tablet Commonly known as: Klor-Con  M20 Take 1 tablet (20 mEq total) by mouth daily.        Allergies:  Allergies  Allergen Reactions   Aleve [Naproxen] Other (See Comments)    Told to avoid by MD   Celexa  [Citalopram ] Other (See Comments)    Contraindicated with A-Fib   Glucophage [Metformin] Diarrhea and Other (See Comments)    GI bleeding Blood stool   Motrin [Ibuprofen] Other (See Comments)    Told to avoid by MD   Yellow Jacket Venom [Bee Venom] Swelling and Other (See Comments)    Severe swelling where stung   Penicillins Hives   Xgeva  [Denosumab ] Other (See Comments)    Runny nose and epistaxis  Lower back pain Watery discharge from eyes Raw sinuses    Past Medical History, Surgical history, Social history, and Family History were reviewed and updated.  Review of Systems: Review of Systems  Constitutional: Negative.   HENT: Negative.    Eyes: Negative.   Respiratory: Negative.    Cardiovascular: Negative.   Gastrointestinal: Negative.   Genitourinary: Negative.   Musculoskeletal: Negative.   Skin: Negative.   Neurological: Negative.   Endo/Heme/Allergies: Negative.   Psychiatric/Behavioral: Negative.       Physical Exam:  weight is 210 lb (95.3 kg). His oral temperature is 98.4 F (36.9 C). His blood pressure is 149/83 (abnormal) and his pulse is 78. His respiration is 18 and oxygen saturation is 100%.   Wt Readings from Last 3 Encounters:  02/18/24 210 lb (95.3 kg)  02/11/24 210 lb (95.3 kg)  01/21/24 207 lb (93.9 kg)    Physical Exam Vitals reviewed.  HENT:     Head: Normocephalic and atraumatic.  Eyes:     Pupils: Pupils are equal, round, and reactive to light.  Cardiovascular:      Rate and Rhythm: Normal rate and regular rhythm.     Heart sounds: Normal heart sounds.  Pulmonary:     Effort: Pulmonary effort is normal.     Breath sounds: Normal breath sounds.  Abdominal:     General: Bowel sounds are normal.     Palpations: Abdomen is soft.  Musculoskeletal:        General: No tenderness or deformity. Normal range of motion.     Cervical back: Normal range of motion.  Lymphadenopathy:     Cervical: No cervical adenopathy.  Skin:    General: Skin  is warm and dry.     Findings: No erythema or rash.  Neurological:     Mental Status: John Douglas is alert and oriented to person, place, and time.  Psychiatric:        Behavior: Behavior normal.        Thought Content: Thought content normal.        Judgment: Judgment normal.     Lab Results  Component Value Date   WBC 3.4 (L) 02/18/2024   HGB 10.3 (L) 02/18/2024   HCT 30.7 (L) 02/18/2024   MCV 93.6 02/18/2024   PLT 174 02/18/2024   Lab Results  Component Value Date   FERRITIN 950 (H) 02/11/2024   IRON 94 02/11/2024   TIBC 266 02/11/2024   UIBC 172 02/11/2024   IRONPCTSAT 35 02/11/2024   Lab Results  Component Value Date   RETICCTPCT 4.8 (H) 02/11/2024   RBC 3.28 (L) 02/18/2024   No results found for: KPAFRELGTCHN, LAMBDASER, KAPLAMBRATIO No results found for: IGGSERUM, IGA, IGMSERUM No results found for: STEPHANY CARLOTA BENSON MARKEL EARLA JOANNIE DOC VICK, SPEI   Chemistry      Component Value Date/Time   NA 143 02/11/2024 0953   NA 140 08/28/2019 0940   K 3.6 02/11/2024 0953   CL 106 02/11/2024 0953   CO2 27 02/11/2024 0953   BUN 16 02/11/2024 0953   BUN 21 08/28/2019 0940   CREATININE 0.98 02/11/2024 0953      Component Value Date/Time   CALCIUM  9.2 02/11/2024 0953   ALKPHOS 54 02/11/2024 0953   AST 19 02/11/2024 0953   ALT 13 02/11/2024 0953   BILITOT 1.5 (H) 02/11/2024 0953     Encounter Diagnoses  Name Primary?   Rectal cancer (HCC) Yes    Anemia due to antineoplastic chemotherapy    Port-A-Cath in place    Iron deficiency anemia due to chronic blood loss    Leukopenia due to antineoplastic chemotherapy    Chemotherapy-induced thrombocytopenia    Impression and Plan: John Douglas is a pleasant 68 yo caucasian gentleman with history of CVA secondary to atrial fib and rectal bleeding. John Douglas was diagnosed with metastatic adenocarcinoma of the rectum -K-ras wild type/BRAF wild-type/HER-2 negative/MMR proficient. John Douglas had adenopathy distant to the rectum and now has had disease recurrence.   John Douglas is now on FOLFIRI with a plan to do 4 cycles and then we will repeat his PET scan again.   Today his counts have rebounded as expected.  CMP pending but if within parameters we will proceed forward with Cycle 2 treatment today RTC 2 weeks MD, port labs, [place]  Harbour Nordmeyer M [place], PA-C 10/21/202510:37 AM

## 2024-02-18 NOTE — Progress Notes (Signed)
 Ok to treat with Tbili 1.6 per Lauraine Dais, PA

## 2024-02-18 NOTE — Patient Instructions (Signed)
 [facility] - A DEPT OF [facility]  Discharge Instructions: Thank you for choosing [facility] to provide your oncology and hematology care.   If you have a lab appointment with the Cancer Center, please go directly to the Cancer Center and check in at the registration area.  Wear comfortable clothing and clothing appropriate for easy access to any Portacath or PICC line.   We strive to give you quality time with your provider. You may need to reschedule your appointment if you arrive late (15 or more minutes).  Arriving late affects you and other patients whose appointments are after yours.  Also, if you miss three or more appointments without notifying the office, you may be dismissed from the clinic at the provider's discretion.      For prescription refill requests, have your pharmacy contact our office and allow 72 hours for refills to be completed.    Today you received the following chemotherapy and/or immunotherapy agents Irinotecan/Leucovorin/5 FU       To help prevent nausea and vomiting after your treatment, we encourage you to take your nausea medication as directed.  BELOW ARE SYMPTOMS THAT SHOULD BE REPORTED IMMEDIATELY: *FEVER GREATER THAN 100.4 F (38 C) OR HIGHER *CHILLS OR SWEATING *NAUSEA AND VOMITING THAT IS NOT CONTROLLED WITH YOUR NAUSEA MEDICATION *UNUSUAL SHORTNESS OF BREATH *UNUSUAL BRUISING OR BLEEDING *URINARY PROBLEMS (pain or burning when urinating, or frequent urination) *BOWEL PROBLEMS (unusual diarrhea, constipation, pain near the anus) TENDERNESS IN MOUTH AND THROAT WITH OR WITHOUT PRESENCE OF ULCERS (sore throat, sores in mouth, or a toothache) UNUSUAL RASH, SWELLING OR PAIN  UNUSUAL VAGINAL DISCHARGE OR ITCHING   Items with * indicate a potential emergency and should be followed up as soon as possible or go to the Emergency Department if any problems should occur.  Please show the CHEMOTHERAPY ALERT CARD  or IMMUNOTHERAPY ALERT CARD at check-in to the Emergency Department and triage nurse. Should you have questions after your visit or need to cancel or reschedule your appointment, please contact [facility] - A DEPT OF [facility]  [phone] and follow the prompts.  Office hours are 8:00 a.m. to 4:30 p.m. Monday - Friday. Please note that voicemails left after 4:00 p.m. may not be returned until the following business day.  We are closed weekends and major holidays. You have access to a nurse at all times for urgent questions. Please call the main number to the clinic [phone] and follow the prompts.  For any non-urgent questions, you may also contact your provider using MyChart. We now offer e-Visits for anyone 54 and older to request care online for non-urgent symptoms. For details visit [email or web address].   Also download the MyChart app! Go to the app store, search "MyChart", open the app, select [place], and log in with your MyChart username and password.

## 2024-02-19 ENCOUNTER — Encounter: Payer: Self-pay | Admitting: Hematology & Oncology

## 2024-02-19 ENCOUNTER — Other Ambulatory Visit: Payer: Self-pay

## 2024-02-20 ENCOUNTER — Inpatient Hospital Stay

## 2024-02-20 NOTE — Patient Instructions (Signed)

## 2024-02-24 ENCOUNTER — Encounter: Payer: Self-pay | Admitting: Hematology & Oncology

## 2024-02-25 ENCOUNTER — Ambulatory Visit: Admitting: Hematology & Oncology

## 2024-02-25 ENCOUNTER — Other Ambulatory Visit

## 2024-02-25 ENCOUNTER — Ambulatory Visit

## 2024-02-25 ENCOUNTER — Inpatient Hospital Stay

## 2024-02-27 ENCOUNTER — Encounter

## 2024-03-02 ENCOUNTER — Encounter: Payer: Self-pay | Admitting: Nurse Practitioner

## 2024-03-02 ENCOUNTER — Ambulatory Visit (INDEPENDENT_AMBULATORY_CARE_PROVIDER_SITE_OTHER): Payer: Self-pay | Admitting: Nurse Practitioner

## 2024-03-02 VITALS — BP 134/82 | HR 92 | Temp 97.9°F | Ht 70.0 in | Wt 212.8 lb

## 2024-03-02 DIAGNOSIS — D500 Iron deficiency anemia secondary to blood loss (chronic): Secondary | ICD-10-CM | POA: Diagnosis not present

## 2024-03-02 DIAGNOSIS — E1169 Type 2 diabetes mellitus with other specified complication: Secondary | ICD-10-CM

## 2024-03-02 DIAGNOSIS — I739 Peripheral vascular disease, unspecified: Secondary | ICD-10-CM | POA: Diagnosis not present

## 2024-03-02 DIAGNOSIS — C20 Malignant neoplasm of rectum: Secondary | ICD-10-CM

## 2024-03-02 DIAGNOSIS — I2510 Atherosclerotic heart disease of native coronary artery without angina pectoris: Secondary | ICD-10-CM | POA: Diagnosis not present

## 2024-03-02 DIAGNOSIS — I5020 Unspecified systolic (congestive) heart failure: Secondary | ICD-10-CM

## 2024-03-02 DIAGNOSIS — I6930 Unspecified sequelae of cerebral infarction: Secondary | ICD-10-CM

## 2024-03-02 DIAGNOSIS — I4819 Other persistent atrial fibrillation: Secondary | ICD-10-CM | POA: Diagnosis not present

## 2024-03-02 DIAGNOSIS — I10 Essential (primary) hypertension: Secondary | ICD-10-CM

## 2024-03-02 DIAGNOSIS — E785 Hyperlipidemia, unspecified: Secondary | ICD-10-CM

## 2024-03-02 NOTE — Progress Notes (Unsigned)
 Careteam: Patient Care Team: Caro Harlene POUR, NP as PCP - General (Geriatric Medicine) End, Lonni, MD as PCP - Cardiology (Cardiology) Timmy Maude SAUNDERS, MD as Medical Oncologist (Oncology)  PLACE OF SERVICE:  [facility]  Advanced Directive information    Allergies  Allergen Reactions   Aleve [Naproxen] Other (See Comments)    Told to avoid by MD   Celexa  [Citalopram ] Other (See Comments)    Contraindicated with A-Fib   Glucophage [Metformin] Diarrhea and Other (See Comments)    GI bleeding Blood stool   Motrin [Ibuprofen] Other (See Comments)    Told to avoid by MD   Yellow Jacket Venom [Bee Venom] Swelling and Other (See Comments)    Severe swelling where stung   Penicillins Hives   Xgeva  [Denosumab ] Other (See Comments)    Runny nose and epistaxis  Lower back pain Watery discharge from eyes Raw sinuses    Chief Complaint  Patient presents with   Establish Care    HPI:  Discussed the use of AI scribe software for clinical note transcription with the patient, who gave verbal consent to proceed.  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  He has a history of atrial fibrillation, managed with Eliquis  5 mg twice daily. His heart rhythm remains irregular, but he does not experience palpitations. He also has coronary artery disease and peripheral artery disease, with symptoms including a cooler right foot and loss of hair on his legs. He does not experience angina or chest pain.  His colon cancer has metastasized to his spine. Previously on  Lonsurf  chemotherapy caused severe anemia and required hospitalization. He is currently undergoing chemotherapy and is no longer on Lonsurf . He does not have back pain since starting the current treatment.  He has diabetes, managed with Amaryl  1 mg daily. His last A1c  was 6.0 in August. He does not regularly monitor his blood glucose levels and has not experienced hypoglycemia.  He takes potassium supplements due to Lasix  use.  He has high cholesterol, managed with Zetia , as he cannot tolerate statins due to muscle pain. His cholesterol levels have not been checked recently.  He had a stroke four years ago, affecting his balance. He occasionally uses a walker and has had one fall due to distraction.  He experiences situational anxiety and is not on medication for it, able to manage.  He has a significant allergy history, including reactions to Celexa  and yellow jacket stings. He had a childhood reaction to penicillin but has since tolerated amoxicillin.  He occasionally uses marijuana and has not consumed alcohol in the past five to six years. He quit chewing tobacco five years ago.    Review of Systems:  Review of Systems  Constitutional:  Negative for chills, fever and weight loss.  HENT:  Negative for tinnitus.   Respiratory:  Negative for cough, sputum production and shortness of breath.   Cardiovascular:  Negative for chest pain, palpitations and leg swelling.  Gastrointestinal:  Negative for abdominal pain, constipation, diarrhea and heartburn.  Genitourinary:  Negative for dysuria, frequency and urgency.  Musculoskeletal:  Negative for back pain, falls, joint pain and myalgias.  Skin: Negative.   Neurological:  Negative for dizziness and headaches.  Psychiatric/Behavioral:  Negative for depression and memory loss. The patient does not have insomnia.     Past Medical History:  Diagnosis Date   Allergic rhinitis    [facility]   Anxiety    Benign essential hypertension    [facility]   Colon cancer [facility]) 2021   Coronary artery disease    [facility]   Diabetic neuropathy, type II diabetes mellitus (HCC)    [facility]   Dysrhythmia    Iron deficiency  anemia due to chronic blood loss 11/18/2019   Mixed hyperlipidemia    [facility]   Myocardial infarct [facility])    [facility]   Obesity, unspecified    [facility]   Persistent atrial fibrillation [facility])    Rectal bleeding    [facility]   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) 11/17/2019   Stroke (HCC)    Wide-complex tachycardia 06/04/2019   Past Surgical History:  Procedure Laterality Date   BIOPSY  10/07/2019   Procedure: BIOPSY;  Surgeon: San Sandor GAILS, DO;  Location: [facility] ENDOSCOPY;  Service: Gastroenterology;;   COLONOSCOPY N/A 12/06/2023   Procedure: COLONOSCOPY;  Surgeon: Albertus Gordy HERO, MD;  Location: [facility] ENDOSCOPY;  Service: Gastroenterology;  Laterality: N/A;   COLONOSCOPY WITH PROPOFOL  N/A 10/07/2019   Procedure: COLONOSCOPY WITH PROPOFOL ;  Surgeon: San Sandor GAILS, DO;  Location: [facility] ENDOSCOPY;  Service: Gastroenterology;  Laterality: N/A;   CORONARY ANGIOPLASTY     3 stents   ESOPHAGOGASTRODUODENOSCOPY N/A 12/06/2023   Procedure: EGD (ESOPHAGOGASTRODUODENOSCOPY);  Surgeon: Albertus Gordy HERO, MD;  Location: [facility] ENDOSCOPY;  Service: Gastroenterology;  Laterality: N/A;   IR IMAGING GUIDED PORT INSERTION  11/24/2019   LEFT HEART CATH AND CORONARY ANGIOGRAPHY N/A 06/05/2019   Procedure: LEFT HEART CATH AND CORONARY ANGIOGRAPHY;  Surgeon: Mady Bruckner, MD;  Location: [facility] INVASIVE CV LAB;  Service: Cardiovascular;  Laterality: N/A;   POLYPECTOMY  10/07/2019   Procedure: POLYPECTOMY;  Surgeon: San Sandor GAILS, DO;  Location: [facility] ENDOSCOPY;  Service: Gastroenterology;;   SUBMUCOSAL TATTOO INJECTION  10/07/2019   Procedure: SUBMUCOSAL TATTOO INJECTION;  Surgeon: San Sandor GAILS, DO;  Location: [facility] ENDOSCOPY;  Service: Gastroenterology;;   Social History:   reports that he has never smoked. He quit smokeless tobacco use about 4 years ago.  His smokeless tobacco use included  chew. He reports that he does not currently use alcohol. He reports current drug use. Drug: Marijuana.  Family History  Problem Relation Age of Onset   Diverticulitis Mother    Hypertension Father    Heart disease Father        MI   Prostate cancer Father    Colon cancer Neg Hx    Stomach cancer Neg Hx    Pancreatic cancer Neg Hx    Rectal cancer Neg Hx     Medications: Patient's Medications  New Prescriptions   No medications on file  Previous Medications   ACETAMINOPHEN  (TYLENOL ) 500 MG TABLET    Take 500-1,000 mg by mouth 2 (two) times daily as needed for moderate pain (pain score 4-6), fever or headache.   ELIQUIS  5 MG TABS TABLET    TAKE 1 TABLET BY MOUTH TWICE A DAY   EZETIMIBE  (ZETIA ) 10 MG TABLET    Take 1 tablet by mouth once daily   FLUTICASONE  (FLONASE ) 50 MCG/ACT NASAL SPRAY    Place 1 spray into both nostrils 2 (two) times daily as needed (sinus congestion).   FOLIC ACID  (FOLVITE ) 1 MG TABLET    TAKE 2 TABLETS BY  MOUTH EVERY DAY   FUROSEMIDE  (LASIX ) 20 MG TABLET    Take 1 tablet (20 mg total) by mouth daily.   GLIMEPIRIDE  (AMARYL ) 1 MG TABLET    Take 1 mg by mouth daily.   HYDROCODONE -ACETAMINOPHEN  (NORCO/VICODIN) 5-325 MG TABLET    Take 1 tablet by mouth every 6 (six) hours as needed.   MAGNESIUM 200 MG TABS    Take 200 mg by mouth in the morning and at bedtime.   METOPROLOL  SUCCINATE (TOPROL -XL) 50 MG 24 HR TABLET    Take 50 mg by mouth daily.   PANTOPRAZOLE  (PROTONIX ) 40 MG TABLET    Take 1 tablet (40 mg total) by mouth daily before breakfast.   POTASSIUM CHLORIDE  SA (KLOR-CON  M20) 20 MEQ TABLET    Take 1 tablet (20 mEq total) by mouth daily.  Modified Medications   No medications on file  Discontinued Medications   No medications on file    Physical Exam:  Vitals:   03/02/24 1106  BP: 134/82  Pulse: 92  Temp: 97.9 F (36.6 C)  SpO2: 98%  Weight: 212 lb 12.8 oz (96.5 kg)  Height: 5' 10 (1.778 m)   Body mass index is 30.53 kg/m. Wt Readings from  Last 3 Encounters:  03/02/24 212 lb 12.8 oz (96.5 kg)  02/18/24 210 lb (95.3 kg)  02/11/24 210 lb (95.3 kg)    Physical Exam Constitutional:      General: He is not in acute distress.    Appearance: He is well-developed. He is not diaphoretic.  HENT:     Head: Normocephalic and atraumatic.     Right Ear: External ear normal.     Left Ear: External ear normal.     Mouth/Throat:     Pharynx: No oropharyngeal exudate.  Eyes:     Conjunctiva/sclera: Conjunctivae normal.     Pupils: Pupils are equal, round, and reactive to light.  Cardiovascular:     Rate and Rhythm: Normal rate and regular rhythm.     Heart sounds: Normal heart sounds.  Pulmonary:     Effort: Pulmonary effort is normal.     Breath sounds: Normal breath sounds.  Abdominal:     General: Bowel sounds are normal.     Palpations: Abdomen is soft.  Musculoskeletal:        General: No tenderness.     Cervical back: Normal range of motion and neck supple.     Right lower leg: No edema.     Left lower leg: No edema.  Skin:    General: Skin is warm and dry.  Neurological:     Mental Status: He is alert and oriented to person, place, and time.     Labs reviewed: Basic Metabolic Panel: Recent Labs    12/06/23 0400 12/07/23 0400 12/10/23 1117 12/25/23 1011 01/28/24 1012 02/11/24 0953 02/18/24 1010  NA 139 140 141   < > 142 143 141  K 3.7 3.6 3.5   < > 3.7 3.6 3.4*  CL 108 110 102   < > 105 106 104  CO2 23 23 26    < > 25 27 26   GLUCOSE 157* 178* 146*   < > 99 126* 155*  BUN 17 22 17    < > 18 16 16   CREATININE 1.25* 1.29* 0.98   < > 0.99 0.98 1.09  CALCIUM  8.7* 8.5* 9.0   < > 9.2 9.2 9.0  MG 2.1 2.1 2.0  --   --   --   --    < > =  values in this interval not displayed.   Liver Function Tests: Recent Labs    01/28/24 1012 02/11/24 0953 02/18/24 1010  AST 17 19 20   ALT 11 13 14   ALKPHOS 49 54 57  BILITOT 1.4* 1.5* 1.6*  PROT 6.9 6.4* 6.6  ALBUMIN 4.3 4.2 4.4   No results for input(s): LIPASE,  AMYLASE in the last 8760 hours. No results for input(s): AMMONIA in the last 8760 hours. CBC: Recent Labs    01/28/24 1012 02/11/24 0953 02/18/24 1010  WBC 5.2 3.1* 3.4*  NEUTROABS 3.9 2.0 2.2  HGB 10.2* 8.3* 10.3*  HCT 30.3* 24.9* 30.7*  MCV 92.9 95.0 93.6  PLT 166 137* 174   Lipid Panel: No results for input(s): CHOL, HDL, LDLCALC, TRIG, CHOLHDL, LDLDIRECT in the last 8760 hours. TSH: No results for input(s): TSH in the last 8760 hours. A1C: Lab Results  Component Value Date   HGBA1C 6.0 (H) 12/04/2023     Assessment/Plan  Persistent atrial fibrillation (HCC) Assessment & Plan: Managed with metprolol 50 mg daily for rate and continues on eliquis  for anticoagulation.  Followed by cardiology   PAD (peripheral artery disease) Assessment & Plan: Stable without symptoms of claudication at this time Continues on eliquis  due to a fib.    Coronary artery disease involving native coronary artery of native heart without angina pectoris Assessment & Plan: Coronary artery disease without angina. Managed with metoprolol  and Zetia . No recent cholesterol check. - Continue metoprolol  succinate 50 mg daily. - Continue Zetia . - Ordered cholesterol panel- would like to hold off on labs today Will get at follow up   Iron deficiency anemia due to chronic blood loss Assessment & Plan: Stable on last lab, continues to follow up with oncology   Heart failure with mildly reduced ejection fraction (HFmrEF, 41-49%) (HCC) Assessment & Plan: Managed with Lasix  and metoprolol . No fluid retention or dyspnea. - Continue Lasix  20 mg daily. - Continue metoprolol  succinate 50 mg daily.   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 Previously declined surgery for quality of life. Resumed chemotherapy. - Continue chemotherapy as per oncologist's plan.  Benign hypertension Assessment & Plan: Well controlled  with metoprolol . BP around 130/80 mmHg. - Continue metoprolol  succinate 50 mg daily.   History of stroke with residual deficit Assessment & Plan: Symptoms stable at this time No new deficits, persistent balance issues. - Continue to monitor balance and mobility. Continues on eliquis  with statin Due for follow up lipids   Hyperlipidemia LDL goal <70 Assessment & Plan: Managed with Zetia . Statins not tolerated. No recent cholesterol check. - Continue Zetia . - due for cholesterol panel- defers blood work until follow up    Type 2 diabetes mellitus with other specified complication, without long-term current use of insulin  [facility]) Assessment & Plan: Managed with glimepiride . A1c was 6.0. Blood sugars fluctuate, history of hyperglycemia. - Continue glimepiride  1 mg daily. - due for follow up  A1c.     Return in about 6 weeks (around 04/13/2024) for routine follow up, labs at time of visit. Will need A1c and lipids  Porshia Blizzard K. Caro BODILY [facility] [phone]

## 2024-03-03 ENCOUNTER — Inpatient Hospital Stay (HOSPITAL_BASED_OUTPATIENT_CLINIC_OR_DEPARTMENT_OTHER): Admitting: Hematology & Oncology

## 2024-03-03 ENCOUNTER — Other Ambulatory Visit: Payer: Self-pay

## 2024-03-03 ENCOUNTER — Ambulatory Visit: Payer: Self-pay | Admitting: Hematology & Oncology

## 2024-03-03 ENCOUNTER — Inpatient Hospital Stay

## 2024-03-03 ENCOUNTER — Encounter: Payer: Self-pay | Admitting: Hematology & Oncology

## 2024-03-03 ENCOUNTER — Inpatient Hospital Stay: Attending: Hematology & Oncology

## 2024-03-03 VITALS — BP 125/71 | HR 96 | Temp 97.9°F | Resp 18 | Ht 70.0 in | Wt 211.0 lb

## 2024-03-03 DIAGNOSIS — C20 Malignant neoplasm of rectum: Secondary | ICD-10-CM

## 2024-03-03 DIAGNOSIS — Z8673 Personal history of transient ischemic attack (TIA), and cerebral infarction without residual deficits: Secondary | ICD-10-CM | POA: Diagnosis not present

## 2024-03-03 DIAGNOSIS — Z5111 Encounter for antineoplastic chemotherapy: Secondary | ICD-10-CM | POA: Insufficient documentation

## 2024-03-03 DIAGNOSIS — Z7901 Long term (current) use of anticoagulants: Secondary | ICD-10-CM | POA: Insufficient documentation

## 2024-03-03 DIAGNOSIS — D500 Iron deficiency anemia secondary to blood loss (chronic): Secondary | ICD-10-CM | POA: Diagnosis not present

## 2024-03-03 DIAGNOSIS — I4891 Unspecified atrial fibrillation: Secondary | ICD-10-CM | POA: Insufficient documentation

## 2024-03-03 DIAGNOSIS — Z452 Encounter for adjustment and management of vascular access device: Secondary | ICD-10-CM | POA: Diagnosis not present

## 2024-03-03 LAB — CMP (CANCER CENTER ONLY)
ALT: 12 U/L (ref 0–44)
AST: 20 U/L (ref 15–41)
Albumin: 4.3 g/dL (ref 3.5–5.0)
Alkaline Phosphatase: 67 U/L (ref 38–126)
Anion gap: 9 (ref 5–15)
BUN: 20 mg/dL (ref 8–23)
CO2: 26 mmol/L (ref 22–32)
Calcium: 9.4 mg/dL (ref 8.9–10.3)
Chloride: 106 mmol/L (ref 98–111)
Creatinine: 1.05 mg/dL (ref 0.61–1.24)
GFR, Estimated: >60 mL/min (ref >60)
Glucose, Bld: 111 mg/dL — ABNORMAL HIGH (ref 70–99)
Potassium: 4 mmol/L (ref 3.5–5.1)
Sodium: 141 mmol/L (ref 135–145)
Total Bilirubin: 1.3 mg/dL — ABNORMAL HIGH (ref 0.0–1.2)
Total Protein: 6.8 g/dL (ref 6.5–8.1)

## 2024-03-03 LAB — CBC WITH DIFFERENTIAL (CANCER CENTER ONLY)
Abs Immature Granulocytes: 0.02 K/uL (ref 0.00–0.07)
Basophils Absolute: 0 K/uL (ref 0.0–0.1)
Basophils Relative: 0 %
Eosinophils Absolute: 0.1 K/uL (ref 0.0–0.5)
Eosinophils Relative: 2 %
HCT: 27.8 % — ABNORMAL LOW (ref 39.0–52.0)
Hemoglobin: 9.2 g/dL — ABNORMAL LOW (ref 13.0–17.0)
Immature Granulocytes: 1 %
Lymphocytes Relative: 25 %
Lymphs Abs: 0.8 K/uL (ref 0.7–4.0)
MCH: 31.7 pg (ref 26.0–34.0)
MCHC: 33.1 g/dL (ref 30.0–36.0)
MCV: 95.9 fL (ref 80.0–100.0)
Monocytes Absolute: 0.3 K/uL (ref 0.1–1.0)
Monocytes Relative: 9 %
Neutro Abs: 2 K/uL (ref 1.7–7.7)
Neutrophils Relative %: 63 %
Platelet Count: 148 K/uL — ABNORMAL LOW (ref 150–400)
RBC: 2.9 MIL/uL — ABNORMAL LOW (ref 4.22–5.81)
RDW: 21.5 % — ABNORMAL HIGH (ref 11.5–15.5)
WBC Count: 3.1 K/uL — ABNORMAL LOW (ref 4.0–10.5)
nRBC: 0 % (ref 0.0–0.2)

## 2024-03-03 NOTE — Patient Instructions (Signed)

## 2024-03-04 ENCOUNTER — Encounter: Payer: Self-pay | Admitting: Hematology & Oncology

## 2024-03-04 ENCOUNTER — Inpatient Hospital Stay

## 2024-03-04 VITALS — BP 131/84 | HR 83 | Temp 98.2°F | Resp 20

## 2024-03-04 DIAGNOSIS — C20 Malignant neoplasm of rectum: Secondary | ICD-10-CM

## 2024-03-04 DIAGNOSIS — Z5111 Encounter for antineoplastic chemotherapy: Secondary | ICD-10-CM | POA: Diagnosis not present

## 2024-03-04 MED ORDER — SODIUM CHLORIDE 0.9 % IV SOLN
144.0000 mg/m2 | Freq: Once | INTRAVENOUS | Status: AC
  Administered 2024-03-04: 300 mg via INTRAVENOUS
  Filled 2024-03-04: qty 15

## 2024-03-04 MED ORDER — FLUOROURACIL CHEMO INJECTION 2.5 GM/50ML
400.0000 mg/m2 | Freq: Once | INTRAVENOUS | Status: AC
  Administered 2024-03-04: 850 mg via INTRAVENOUS
  Filled 2024-03-04: qty 17

## 2024-03-04 MED ORDER — SODIUM CHLORIDE 0.9 % IV SOLN
400.0000 mg/m2 | Freq: Once | INTRAVENOUS | Status: AC
  Administered 2024-03-04: 860 mg via INTRAVENOUS
  Filled 2024-03-04: qty 43

## 2024-03-04 MED ORDER — SODIUM CHLORIDE 0.9 % IV SOLN: INTRAVENOUS | Status: DC

## 2024-03-04 MED ORDER — PALONOSETRON HCL INJECTION 0.25 MG/5ML
0.2500 mg | Freq: Once | INTRAVENOUS | Status: AC
  Administered 2024-03-04: 0.25 mg via INTRAVENOUS
  Filled 2024-03-04: qty 5

## 2024-03-04 MED ORDER — ATROPINE SULFATE 1 MG/ML IV SOLN
0.5000 mg | Freq: Once | INTRAVENOUS | Status: AC | PRN
  Administered 2024-03-04: 0.5 mg via INTRAVENOUS
  Filled 2024-03-04: qty 1

## 2024-03-04 MED ORDER — SODIUM CHLORIDE 0.9 % IV SOLN
1920.0000 mg/m2 | INTRAVENOUS | Status: DC
  Administered 2024-03-04: 4150 mg via INTRAVENOUS
  Filled 2024-03-04: qty 83

## 2024-03-04 MED ORDER — DEXAMETHASONE SOD PHOSPHATE PF 10 MG/ML IJ SOLN
10.0000 mg | Freq: Once | INTRAMUSCULAR | Status: AC
  Administered 2024-03-04: 10 mg via INTRAVENOUS

## 2024-03-04 NOTE — Progress Notes (Signed)
 Hematology and Oncology Follow Up Visit  John Douglas [id number] [birth year] 68 y.o. 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 twice daily Aspirin  81 mg p.o. daily FOLFOX/Vectibix  --start on 03/07/2023; s/p cycle #8 - d/c on 07/30/2023 Xgeva  120 mg subcu every 3 months-start 03/07/2023 - patient declined any further Xgeva . Lonsurf  60 mg po BID - start on 08/05/2023 -- changed on 09/20/2023 -- d/c on 11/25/2023 FOLFIRI-s/p  cycle #2 - start  on 01/29/2024                           Interim History:  [name] is here today for follow-up.  So far, he has done pretty well.  He really has had very little way to toxicity from the chemotherapy.  His hemoglobin does go down a little bit.  Again, there is no obvious GI bleeding.  He has had no abdominal pain.  He has had no problems going to the bathroom.  He has had no fever.  He has had no cough or shortness of breath.  There is been no leg swelling.  He has had no mouth sores.  His birthday is coming up.  I am sure that he will have a wonderful celebration that day.  He has had no headache.  He has had no rashes.  Of note, his last CEA was 1.16.  Overall, I will say his performance status is probably ECOG 1.    Wt Readings from Last 3 Encounters:  03/03/24 211 lb (95.7 kg)  03/02/24 212 lb 12.8 oz (96.5 kg)  02/18/24 210 lb (95.3 kg)   Medications:  Allergies as of 03/03/2024       Reactions   Celexa  [citalopram ] Other (See Comments)   Had  a reaction to medication   Glucophage [metformin] Diarrhea, Other (See Comments)   GI bleeding Blood stool   Yellow Jacket Venom [bee Venom] Swelling, Other (See Comments)   Severe swelling where stung   Xgeva  [denosumab ] Other (See Comments)   Runny nose and epistaxis  Lower back pain Watery discharge from eyes Raw sinuses   Statins    Significant myalgias         Medication List        Accurate as of March 03, 2024 11:59 PM. If you have any questions, ask your nurse or doctor.          Eliquis  5 MG Tabs tablet Generic drug: apixaban  TAKE 1 TABLET BY MOUTH TWICE A DAY   ezetimibe  10 MG tablet Commonly known as: ZETIA  Take 1 tablet by mouth once daily   folic acid  1 MG tablet Commonly known as: FOLVITE  TAKE 2 TABLETS BY MOUTH EVERY DAY   furosemide  20 MG tablet Commonly known as: LASIX  Take 1 tablet (20 mg total) by mouth daily.   glimepiride  1 MG tablet Commonly known as: AMARYL  Take 1 mg by mouth daily.   HYDROcodone -acetaminophen  5-325 MG tablet Commonly known as: NORCO/VICODIN Take 1 tablet by mouth every 6 (six) hours as needed.   Magnesium 200  MG Tabs Take 200 mg by mouth in the morning and at bedtime.   metoprolol  succinate 50 MG 24 hr tablet Commonly known as: TOPROL -XL Take 50 mg by mouth daily.   potassium chloride  SA 20 MEQ tablet Commonly known as: Klor-Con  M20 Take 1 tablet (20 mEq total) by mouth daily.        Allergies:  Allergies  Allergen Reactions   Celexa  [Citalopram ] Other (See Comments)    Had a reaction to medication   Glucophage [Metformin] Diarrhea and Other (See Comments)    GI bleeding Blood stool   Yellow Jacket Venom [Bee Venom] Swelling and Other (See Comments)    Severe swelling where stung   Xgeva  [Denosumab ] Other (See Comments)    Runny nose and epistaxis  Lower back pain Watery discharge from eyes Raw sinuses   Statins     Significant myalgias     Past Medical History, Surgical history, Social  history, and Family History were reviewed and updated.  Review of Systems: Review of Systems  Constitutional: Negative.   HENT: Negative.    Eyes: Negative.   Respiratory: Negative.    Cardiovascular: Negative.   Gastrointestinal: Negative.   Genitourinary: Negative.   Musculoskeletal: Negative.   Skin: Negative.   Neurological: Negative.   Endo/Heme/Allergies: Negative.   Psychiatric/Behavioral: Negative.       Physical Exam:  height is 5' 10 (1.778 m) and weight is 211 lb (95.7 kg). His oral temperature is 97.9 F (36.6 C). His blood pressure is 125/71 and his pulse is 96. His respiration is 18 and oxygen saturation is 100%.   Wt Readings from Last 3 Encounters:  03/03/24 211 lb (95.7 kg)  03/02/24 212 lb 12.8 oz (96.5 kg)  02/18/24 210 lb (95.3 kg)    Physical Exam Vitals reviewed.  HENT:     Head: Normocephalic and atraumatic.  Eyes:     Pupils: Pupils are equal, round, and reactive to light.  Cardiovascular:     Rate and Rhythm: Normal rate and regular rhythm.     Heart sounds: Normal heart sounds.  Pulmonary:     Effort: Pulmonary effort is normal.     Breath sounds: Normal breath sounds.  Abdominal:     General: Bowel sounds are normal.     Palpations: Abdomen is soft.  Musculoskeletal:        General: No tenderness or deformity. Normal range of motion.     Cervical back: Normal range of motion.  Lymphadenopathy:     Cervical: No cervical adenopathy.  Skin:    General: Skin is warm and dry.     Findings: No erythema or rash.  Neurological:     Mental Status: He is alert and oriented to person, place, and time.  Psychiatric:        Behavior: Behavior normal.        Thought Content: Thought content normal.        Judgment: Judgment normal.     Lab Results  Component Value Date   WBC 3.1 (L) 03/03/2024   HGB 9.2 (L) 03/03/2024   HCT 27.8 (L) 03/03/2024   MCV 95.9 03/03/2024   PLT 148 (L) 03/03/2024   Lab Results  Component Value Date    FERRITIN 950 (H) 02/11/2024   IRON 94 02/11/2024   TIBC 266 02/11/2024   UIBC 172 02/11/2024   IRONPCTSAT 35 02/11/2024   Lab Results  Component Value Date   RETICCTPCT 4.8 (H) 02/11/2024   RBC 2.90 (L) 03/03/2024  No results found for: KPAFRELGTCHN, LAMBDASER, KAPLAMBRATIO No results found for: IGGSERUM, IGA, IGMSERUM No results found for: STEPHANY CARLOTA BENSON MARKEL EARLA JOANNIE DOC VICK, SPEI   Chemistry      Component Value Date/Time   NA 141 03/03/2024 1001   NA 140 08/28/2019 0940   K 4.0 03/03/2024 1001   CL 106 03/03/2024 1001   CO2 26 03/03/2024 1001   BUN 20 03/03/2024 1001   BUN 21 08/28/2019 0940   CREATININE 1.05 03/03/2024 1001      Component Value Date/Time   CALCIUM  9.4 03/03/2024 1001   ALKPHOS 67 03/03/2024 1001   AST 20 03/03/2024 1001   ALT 12 03/03/2024 1001   BILITOT 1.3 (H) 03/03/2024 1001       Impression and Plan: Mr. Warmuth is a pleasant 68 yo caucasian gentleman with history of CVA secondary to atrial fib and rectal bleeding. He was diagnosed with metastatic adenocarcinoma of the rectum -K-ras wild type/BRAF wild-type/HER-2 negative/MMR proficient.   He had adenopathy distant to the rectum and now has had disease recurrence.    He is now on FOLFIRI with a plan to do 4 cycles and then we will repeat his PET scan again.   We will plan to get him back in another couple weeks for his fourth cycle of treatment.  Again, after the fourth cycle, we will go ahead and do a PET scan.  Maude JONELLE Crease, MD 11/5/20257:00 AM

## 2024-03-05 ENCOUNTER — Inpatient Hospital Stay

## 2024-03-05 ENCOUNTER — Other Ambulatory Visit: Payer: Self-pay

## 2024-03-06 ENCOUNTER — Inpatient Hospital Stay

## 2024-03-06 DIAGNOSIS — Z5111 Encounter for antineoplastic chemotherapy: Secondary | ICD-10-CM | POA: Diagnosis not present

## 2024-03-10 ENCOUNTER — Inpatient Hospital Stay

## 2024-03-10 ENCOUNTER — Other Ambulatory Visit

## 2024-03-10 ENCOUNTER — Ambulatory Visit

## 2024-03-10 ENCOUNTER — Ambulatory Visit: Admitting: Hematology & Oncology

## 2024-03-11 DIAGNOSIS — I6930 Unspecified sequelae of cerebral infarction: Secondary | ICD-10-CM | POA: Insufficient documentation

## 2024-03-11 DIAGNOSIS — I739 Peripheral vascular disease, unspecified: Secondary | ICD-10-CM | POA: Insufficient documentation

## 2024-03-11 NOTE — Assessment & Plan Note (Signed)
 Stable without symptoms of claudication at this time Continues on eliquis  due to a fib.

## 2024-03-11 NOTE — Assessment & Plan Note (Signed)
 Managed with Lasix  and metoprolol . No fluid retention or dyspnea. - Continue Lasix  20 mg daily. - Continue metoprolol  succinate 50 mg daily.

## 2024-03-11 NOTE — Assessment & Plan Note (Signed)
 Well controlled with metoprolol . BP around 130/80 mmHg. - Continue metoprolol  succinate 50 mg daily.

## 2024-03-11 NOTE — Assessment & Plan Note (Addendum)
 Symptoms stable at this time No new deficits, persistent balance issues. - Continue to monitor balance and mobility. Continues on eliquis  with statin Due for follow up lipids

## 2024-03-11 NOTE — Assessment & Plan Note (Signed)
 Managed with glimepiride . A1c was 6.0. Blood sugars fluctuate, history of hyperglycemia. - Continue glimepiride  1 mg daily. - due for follow up  A1c.

## 2024-03-11 NOTE — Assessment & Plan Note (Signed)
 Managed with metprolol 50 mg daily for rate and continues on eliquis  for anticoagulation.  Followed by cardiology

## 2024-03-11 NOTE — Assessment & Plan Note (Signed)
 Coronary artery disease without angina. Managed with metoprolol  and Zetia . No recent cholesterol check. - Continue metoprolol  succinate 50 mg daily. - Continue Zetia . - Ordered cholesterol panel- would like to hold off on labs today Will get at follow up

## 2024-03-11 NOTE — Assessment & Plan Note (Signed)
 Stable on last lab, continues to follow up with oncology

## 2024-03-11 NOTE — Assessment & Plan Note (Signed)
 Managed with Zetia . Statins not tolerated. No recent cholesterol check. - Continue Zetia . - due for cholesterol panel- defers blood work until follow up

## 2024-03-12 ENCOUNTER — Inpatient Hospital Stay

## 2024-03-17 ENCOUNTER — Encounter: Payer: Self-pay | Admitting: Hematology & Oncology

## 2024-03-17 ENCOUNTER — Inpatient Hospital Stay

## 2024-03-17 ENCOUNTER — Inpatient Hospital Stay: Admitting: Hematology & Oncology

## 2024-03-17 ENCOUNTER — Other Ambulatory Visit: Payer: Self-pay

## 2024-03-17 VITALS — BP 117/71 | HR 87 | Temp 97.8°F | Resp 20 | Ht 70.0 in | Wt 212.0 lb

## 2024-03-17 VITALS — BP 114/64 | HR 79 | Temp 98.0°F | Resp 18

## 2024-03-17 DIAGNOSIS — C20 Malignant neoplasm of rectum: Secondary | ICD-10-CM

## 2024-03-17 DIAGNOSIS — D500 Iron deficiency anemia secondary to blood loss (chronic): Secondary | ICD-10-CM

## 2024-03-17 DIAGNOSIS — Z5111 Encounter for antineoplastic chemotherapy: Secondary | ICD-10-CM | POA: Diagnosis not present

## 2024-03-17 LAB — CBC WITH DIFFERENTIAL (CANCER CENTER ONLY)
Abs Immature Granulocytes: 0.01 K/uL (ref 0.00–0.07)
Basophils Absolute: 0 K/uL (ref 0.0–0.1)
Basophils Relative: 1 %
Eosinophils Absolute: 0.1 K/uL (ref 0.0–0.5)
Eosinophils Relative: 3 %
HCT: 24.1 % — ABNORMAL LOW (ref 39.0–52.0)
Hemoglobin: 8 g/dL — ABNORMAL LOW (ref 13.0–17.0)
Immature Granulocytes: 1 %
Lymphocytes Relative: 33 %
Lymphs Abs: 0.6 K/uL — ABNORMAL LOW (ref 0.7–4.0)
MCH: 32.5 pg (ref 26.0–34.0)
MCHC: 33.2 g/dL (ref 30.0–36.0)
MCV: 98 fL (ref 80.0–100.0)
Monocytes Absolute: 0.2 K/uL (ref 0.1–1.0)
Monocytes Relative: 9 %
Neutro Abs: 1 K/uL — ABNORMAL LOW (ref 1.7–7.7)
Neutrophils Relative %: 53 %
Platelet Count: 125 K/uL — ABNORMAL LOW (ref 150–400)
RBC: 2.46 MIL/uL — ABNORMAL LOW (ref 4.22–5.81)
RDW: 22.6 % — ABNORMAL HIGH (ref 11.5–15.5)
WBC Count: 1.9 K/uL — ABNORMAL LOW (ref 4.0–10.5)
nRBC: 0 % (ref 0.0–0.2)

## 2024-03-17 LAB — IRON AND IRON BINDING CAPACITY (CC-WL,HP ONLY)
Iron: 102 ug/dL (ref 45–182)
Saturation Ratios: 35 % (ref 17.9–39.5)
TIBC: 293 ug/dL (ref 250–450)
UIBC: 191 ug/dL

## 2024-03-17 LAB — CMP (CANCER CENTER ONLY)
ALT: 11 U/L (ref 0–44)
AST: 17 U/L (ref 15–41)
Albumin: 4.1 g/dL (ref 3.5–5.0)
Alkaline Phosphatase: 57 U/L (ref 38–126)
Anion gap: 12 (ref 5–15)
BUN: 16 mg/dL (ref 8–23)
CO2: 26 mmol/L (ref 22–32)
Calcium: 8.9 mg/dL (ref 8.9–10.3)
Chloride: 106 mmol/L (ref 98–111)
Creatinine: 1.12 mg/dL (ref 0.61–1.24)
GFR, Estimated: >60 mL/min (ref >60)
Glucose, Bld: 127 mg/dL — ABNORMAL HIGH (ref 70–99)
Potassium: 3.3 mmol/L — ABNORMAL LOW (ref 3.5–5.1)
Sodium: 144 mmol/L (ref 135–145)
Total Bilirubin: 1.1 mg/dL (ref 0.0–1.2)
Total Protein: 6.4 g/dL — ABNORMAL LOW (ref 6.5–8.1)

## 2024-03-17 LAB — FERRITIN: Ferritin: 1018 ng/mL — ABNORMAL HIGH (ref 24–336)

## 2024-03-17 LAB — SAMPLE TO BLOOD BANK

## 2024-03-17 LAB — RETICULOCYTES
Immature Retic Fract: 19.7 % — ABNORMAL HIGH (ref 2.3–15.9)
RBC.: 2.42 MIL/uL — ABNORMAL LOW (ref 4.22–5.81)
Retic Count, Absolute: 190.5 K/uL — ABNORMAL HIGH (ref 19.0–186.0)
Retic Ct Pct: 7.9 % — ABNORMAL HIGH (ref 0.4–3.1)

## 2024-03-17 LAB — PREPARE RBC (CROSSMATCH)

## 2024-03-17 MED ORDER — POTASSIUM CHLORIDE 10 MEQ/100ML IV SOLN
10.0000 meq | INTRAVENOUS | Status: AC
  Administered 2024-03-17 (×2): 10 meq via INTRAVENOUS
  Filled 2024-03-17 (×3): qty 100

## 2024-03-17 MED ORDER — DIPHENHYDRAMINE HCL 25 MG PO CAPS
25.0000 mg | ORAL_CAPSULE | Freq: Once | ORAL | Status: AC
  Administered 2024-03-17: 25 mg via ORAL
  Filled 2024-03-17: qty 1

## 2024-03-17 MED ORDER — ACETAMINOPHEN 325 MG PO TABS
650.0000 mg | ORAL_TABLET | Freq: Once | ORAL | Status: AC
  Administered 2024-03-17: 650 mg via ORAL
  Filled 2024-03-17: qty 2

## 2024-03-17 MED ORDER — SODIUM CHLORIDE 0.9 % IV SOLN: INTRAVENOUS | Status: DC

## 2024-03-17 NOTE — Patient Instructions (Signed)

## 2024-03-17 NOTE — Patient Instructions (Signed)
 Blood Transfusion, Adult A blood transfusion is a procedure in which you receive blood or a type of blood cell (blood component) through an IV. You may need a blood transfusion when you have a low blood count, which is a low number of any blood cell. This may result from a bleeding disorder, illness, injury, or surgery. The blood may come from a donor, or you may be able to have your own blood collected and stored (autologous blood donation) before a planned surgery. The blood given in a transfusion may be made up of different blood components. You may receive: Red blood cells. These carry oxygen to the cells in the body. Platelets. These help your blood to clot. Plasma. This is the liquid part of your blood. It carries proteins and other substances throughout the body. White blood cells. These help you fight infections. If you have hemophilia or another clotting disorder, you may also receive other types of blood products. Depending on the type of blood product, this procedure may take 1-4 hours to complete. Tell a health care provider about: Any bleeding problems you have. Any previous reactions you have had during a blood transfusion. Any allergies you have. All medicines you are taking, including vitamins, herbs, eye drops, creams, and over-the-counter medicines. Any surgeries you have had. Any medical conditions you have. Whether you are pregnant or may be pregnant. What are the risks? Talk with your health care provider about risks. The most common problems include: A mild allergic reaction, such as red, swollen areas of skin (hives) and itching. Fever or chills. This may be the body's response to new blood cells received. This may occur during or up to 4 hours after the transfusion. More serious problems may include: A serious allergic reaction that causes difficulty breathing or swelling around the face and lips. Transfusion-associated circulatory overload (TACO), or too much fluid in  the lungs. This may cause breathing problems. Transfusion-related acute lung injury (TRALI), which causes breathing difficulty and low oxygen in the blood. This can occur within hours of the transfusion or several days later. Iron overload. This can happen after receiving many blood transfusions over a period of time. Infection or virus being transmitted. This is rare because donated blood is carefully tested before it is given. Hemolytic transfusion reaction. This is rare. It happens when the body's defense system (immune system)tries to attack the new blood cells. Symptoms may include fever, chills, nausea, low blood pressure, and low back or chest pain. Transfusion-associated graft-versus-host disease (TAGVHD). This is rare. It happens when donated cells attack the body's healthy tissues. What happens before the procedure? You will have a blood test to check your blood type. This test is done to know what kind of blood your body will accept and to match it to the donor blood. If you are going to have a planned surgery, you may be able to do an autologous blood donation. This may be done in case you need to have a transfusion. You will have your temperature, blood pressure, and pulse checked before the transfusion. If you have had an allergic reaction to a transfusion in the past, you may be given medicine to help prevent a reaction. This medicine may be given to you by mouth (orally) or through an IV. What happens during the procedure?  An IV will be inserted into one of your veins. The bag of blood will be attached to your IV. The blood will then enter through your vein. Your temperature, blood pressure,  and pulse will be monitored during the transfusion. This monitoring is done to detect early signs of a transfusion reaction. Tell your nurse right away if you have any of these symptoms during the transfusion: Shortness of breath or trouble breathing. Chest or back pain. Fever or  chills. Itching or hives. If you have any signs or symptoms of a reaction, your transfusion will be stopped and you may be given medicine. When the transfusion is complete, your IV will be removed. Pressure may be applied to the IV site for a few minutes. A bandage (dressing)will be applied. The procedure may vary among health care providers and hospitals. What happens after the procedure? Your temperature, blood pressure, pulse, breathing rate, and blood oxygen level will be monitored until you leave the hospital or clinic. Your blood may be tested to see how you have responded to the transfusion. You may be warmed with fluids or blankets to maintain a normal body temperature. If you receive your blood transfusion in an outpatient setting, you will be told whom to contact to report any reactions. Where to find more information Visit the [employer]: redcross.org Summary A blood transfusion is a procedure in which you receive blood or a type of blood cell (blood component) through an IV. The blood given in a transfusion may be made up of different blood components. You may receive red blood cells, platelets, plasma, or white blood cells depending on the condition treated. Your temperature, blood pressure, and pulse will be monitored before, during, and after the transfusion. After the transfusion, your blood may be tested to see how your body has responded. This information is not intended to replace advice given to you by your health care provider. Make sure you discuss any questions you have with your health care provider. Document Revised: 07/14/2021 Document Reviewed: 07/14/2021 Elsevier Patient Education  2024 [employer].

## 2024-03-17 NOTE — Progress Notes (Signed)
 Hematology and Oncology Follow Up Visit  Dayan Desa [id number] 1955/08/12 68 y.o. 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 twice daily Aspirin  81 mg p.o. daily FOLFOX/Vectibix  --start on 03/07/2023; s/p cycle #8 - d/c on 07/30/2023 Xgeva  120 mg subcu every 3 months-start 03/07/2023 - patient declined any further Xgeva . Lonsurf  60 mg po BID - start on 08/05/2023 -- changed on 09/20/2023 -- d/c on 11/25/2023 FOLFIRI-s/p  cycle #3- start  on 01/29/2024                           Interim History:  Mr. Trageser is here today for follow-up.  Unfortunately, I just do not think we will be able to treat him.  It is clear that his bone marrow just  does not have a lot of reserve.  His hemoglobin is down to 8.  His white cell count is 1.9.  I just think that is white cell count is just too low for us  to be treat him.  In addition, I think his  Need to have a transfusion.  I really hate the fact that he has this issue with the bone marrow.  Otherwise, he feels well.  He is eating well.  He has had no problems with nausea or vomiting.  He has had no problems with cough or shortness of breath.  He has had no abdominal pain.  He has not noted any melena or bright red blood per rectum.  His last CEA I think was 1.6.  His last PET scan was done back in September.  Will clearly go ahead to get him set up with another PET scan.  He has had no leg swelling.  He has had no rashes.  He has had no headache.  Overall,  I would have to say that his performance status is probably ECOG 1.     Wt Readings from Last 3 Encounters:  03/17/24 212 lb (96.2 kg)  03/03/24 211 lb (95.7 kg)  03/02/24 212 lb 12.8 oz (96.5 kg)   Medications:  Allergies as of 03/17/2024       Reactions   Celexa  [citalopram ] Other (See Comments)   Had a reaction to medication   Glucophage [metformin] Diarrhea, Other (See Comments)   GI bleeding Blood stool   Yellow Jacket Venom [bee Venom] Swelling, Other (See Comments)   Severe swelling where stung   Xgeva  [denosumab ] Other (See Comments)   Runny nose and epistaxis  Lower back pain Watery discharge from eyes Raw sinuses   Statins    Significant myalgias         Medication List        Accurate as of March 17, 2024  1:05 PM. If you have any questions, ask your nurse or doctor.          Eliquis  5 MG Tabs tablet Generic drug: apixaban  TAKE 1 TABLET BY MOUTH TWICE A DAY   ezetimibe  10 MG tablet Commonly known as: ZETIA  Take 1 tablet by mouth once daily   folic acid   1 MG tablet Commonly known as: FOLVITE  TAKE 2 TABLETS BY MOUTH EVERY DAY   furosemide  20 MG tablet Commonly known as: LASIX  Take 1 tablet (20 mg total) by mouth daily.   glimepiride  1 MG tablet Commonly known as: AMARYL  Take 1 mg by mouth daily.   HYDROcodone -acetaminophen  5-325 MG tablet Commonly known as: NORCO/VICODIN Take 1 tablet by mouth every 6 (six) hours as needed.   Magnesium 200 MG Tabs Take 200 mg by mouth in the morning and at bedtime.   metoprolol  succinate 50 MG 24 hr tablet Commonly known as: TOPROL -XL Take 50 mg by mouth daily.   potassium chloride  SA 20 MEQ tablet Commonly known as: Klor-Con  M20 Take 1 tablet (20 mEq total) by mouth daily.        Allergies:  Allergies  Allergen Reactions   Celexa  [Citalopram ] Other (See Comments)    Had a reaction to medication   Glucophage [Metformin] Diarrhea and Other (See Comments)    GI bleeding Blood stool    Yellow Jacket Venom [Bee Venom] Swelling and Other (See Comments)    Severe swelling where stung   Xgeva  [Denosumab ] Other (See Comments)    Runny nose and epistaxis  Lower back pain Watery discharge from eyes Raw sinuses   Statins     Significant myalgias     Past Medical History, Surgical history, Social history, and Family History were reviewed and updated.  Review of Systems: Review of Systems  Constitutional: Negative.   HENT: Negative.    Eyes: Negative.   Respiratory: Negative.    Cardiovascular: Negative.   Gastrointestinal: Negative.   Genitourinary: Negative.   Musculoskeletal: Negative.   Skin: Negative.   Neurological: Negative.   Endo/Heme/Allergies: Negative.   Psychiatric/Behavioral: Negative.       Physical Exam:  height is 5' 10 (1.778 m) and weight is 212 lb (96.2 kg). His oral temperature is 97.8 F (36.6 C). His blood pressure is 117/71 and his pulse is 87. His respiration is 20 and oxygen saturation is 100%.   Wt Readings from Last 3 Encounters:  03/17/24 212 lb (96.2 kg)  03/03/24 211 lb (95.7 kg)  03/02/24 212 lb 12.8 oz (96.5 kg)    Physical Exam Vitals reviewed.  HENT:     Head: Normocephalic and atraumatic.  Eyes:     Pupils: Pupils are equal, round, and reactive to light.  Cardiovascular:     Rate and Rhythm: Normal rate and regular rhythm.     Heart sounds: Normal heart sounds.  Pulmonary:     Effort: Pulmonary effort is normal.     Breath sounds: Normal breath sounds.  Abdominal:     General: Bowel sounds are normal.     Palpations: Abdomen is soft.  Musculoskeletal:        General: No tenderness or deformity. Normal range of motion.     Cervical back: Normal range of motion.  Lymphadenopathy:     Cervical: No cervical adenopathy.  Skin:    General: Skin is warm and dry.     Findings: No erythema or rash.  Neurological:     Mental Status: He is alert and oriented to person, place, and time.  Psychiatric:        Behavior:  Behavior normal.        Thought Content: Thought content normal.        Judgment: Judgment normal.     Lab Results  Component Value Date   WBC 1.9 (L) 03/17/2024   HGB  8.0 (L) 03/17/2024   HCT 24.1 (L) 03/17/2024   MCV 98.0 03/17/2024   PLT 125 (L) 03/17/2024   Lab Results  Component Value Date   FERRITIN 950 (H) 02/11/2024   IRON 94 02/11/2024   TIBC 266 02/11/2024   UIBC 172 02/11/2024   IRONPCTSAT 35 02/11/2024   Lab Results  Component Value Date   RETICCTPCT 7.9 (H) 03/17/2024   RBC 2.46 (L) 03/17/2024   RBC 2.42 (L) 03/17/2024   No results found for: KPAFRELGTCHN, LAMBDASER, KAPLAMBRATIO No results found for: IGGSERUM, IGA, IGMSERUM No results found for: STEPHANY CARLOTA BENSON MARKEL EARLA JOANNIE DOC VICK, SPEI   Chemistry      Component Value Date/Time   NA 144 03/17/2024 0945   NA 140 08/28/2019 0940   K 3.3 (L) 03/17/2024 0945   CL 106 03/17/2024 0945   CO2 26 03/17/2024 0945   BUN 16 03/17/2024 0945   BUN 21 08/28/2019 0940   CREATININE 1.12 03/17/2024 0945      Component Value Date/Time   CALCIUM  8.9 03/17/2024 0945   ALKPHOS 57 03/17/2024 0945   AST 17 03/17/2024 0945   ALT 11 03/17/2024 0945   BILITOT 1.1 03/17/2024 0945       Impression and Plan: Mr. Birman is a pleasant 68 yo caucasian gentleman with history of CVA secondary to atrial fib and rectal bleeding. He was diagnosed with metastatic adenocarcinoma of the rectum -K-ras wild type/BRAF wild-type/HER-2 negative/MMR proficient.   He had adenopathy distant to the rectum and now has had disease recurrence.    Again, we will have to hold his chemotherapy today.  I will go ahead and give him some IV potassium.  He cannot take oral potassium.  I will give him 1 unit of blood.  I think this will help him out.  We will have him come back in 2 weeks.  I would like to give him some time off for Thanksgiving next week.    We will see how his blood counts  look.  Again I would like to try to give him 4 cycles of treatment and then repeat a PET scan.  I know that he is doing everything we have asked him to do.  He really has shown a lot of resilience.  He has had a good quality of life.  He has great support at home.   Maude JONELLE Crease, MD 11/18/20251:05 PM

## 2024-03-18 LAB — TYPE AND SCREEN
ABO/RH(D): O POS
Antibody Screen: NEGATIVE
Unit division: 0

## 2024-03-18 LAB — BPAM RBC
Blood Product Expiration Date: 202512202359
ISSUE DATE / TIME: 202511181252
Unit Type and Rh: 5100

## 2024-03-19 ENCOUNTER — Inpatient Hospital Stay

## 2024-03-19 ENCOUNTER — Other Ambulatory Visit: Payer: Self-pay

## 2024-03-31 ENCOUNTER — Inpatient Hospital Stay: Attending: Hematology & Oncology

## 2024-03-31 ENCOUNTER — Encounter: Payer: Self-pay | Admitting: Hematology & Oncology

## 2024-03-31 ENCOUNTER — Inpatient Hospital Stay

## 2024-03-31 ENCOUNTER — Inpatient Hospital Stay: Admitting: Hematology & Oncology

## 2024-03-31 ENCOUNTER — Other Ambulatory Visit: Payer: Self-pay

## 2024-03-31 VITALS — BP 132/87 | HR 90 | Temp 98.0°F | Resp 18 | Ht 70.0 in | Wt 212.0 lb

## 2024-03-31 DIAGNOSIS — C20 Malignant neoplasm of rectum: Secondary | ICD-10-CM

## 2024-03-31 DIAGNOSIS — I4891 Unspecified atrial fibrillation: Secondary | ICD-10-CM | POA: Insufficient documentation

## 2024-03-31 DIAGNOSIS — Z8673 Personal history of transient ischemic attack (TIA), and cerebral infarction without residual deficits: Secondary | ICD-10-CM | POA: Insufficient documentation

## 2024-03-31 DIAGNOSIS — M545 Low back pain, unspecified: Secondary | ICD-10-CM | POA: Insufficient documentation

## 2024-03-31 DIAGNOSIS — Z7982 Long term (current) use of aspirin: Secondary | ICD-10-CM | POA: Diagnosis not present

## 2024-03-31 DIAGNOSIS — Z5111 Encounter for antineoplastic chemotherapy: Secondary | ICD-10-CM | POA: Insufficient documentation

## 2024-03-31 DIAGNOSIS — Z7901 Long term (current) use of anticoagulants: Secondary | ICD-10-CM | POA: Insufficient documentation

## 2024-03-31 LAB — CBC WITH DIFFERENTIAL (CANCER CENTER ONLY)
Abs Immature Granulocytes: 0.12 K/uL — ABNORMAL HIGH (ref 0.00–0.07)
Basophils Absolute: 0.1 K/uL (ref 0.0–0.1)
Basophils Relative: 1 %
Eosinophils Absolute: 0.1 K/uL (ref 0.0–0.5)
Eosinophils Relative: 1 %
HCT: 29.8 % — ABNORMAL LOW (ref 39.0–52.0)
Hemoglobin: 10 g/dL — ABNORMAL LOW (ref 13.0–17.0)
Immature Granulocytes: 2 %
Lymphocytes Relative: 11 %
Lymphs Abs: 0.8 K/uL (ref 0.7–4.0)
MCH: 31.4 pg (ref 26.0–34.0)
MCHC: 33.6 g/dL (ref 30.0–36.0)
MCV: 93.7 fL (ref 80.0–100.0)
Monocytes Absolute: 0.5 K/uL (ref 0.1–1.0)
Monocytes Relative: 7 %
Neutro Abs: 6 K/uL (ref 1.7–7.7)
Neutrophils Relative %: 78 %
Platelet Count: 224 K/uL (ref 150–400)
RBC: 3.18 MIL/uL — ABNORMAL LOW (ref 4.22–5.81)
RDW: 19.4 % — ABNORMAL HIGH (ref 11.5–15.5)
WBC Count: 7.6 K/uL (ref 4.0–10.5)
nRBC: 0.3 % — ABNORMAL HIGH (ref 0.0–0.2)

## 2024-03-31 LAB — CMP (CANCER CENTER ONLY)
ALT: 8 U/L (ref 0–44)
AST: 18 U/L (ref 15–41)
Albumin: 4.1 g/dL (ref 3.5–5.0)
Alkaline Phosphatase: 59 U/L (ref 38–126)
Anion gap: 12 (ref 5–15)
BUN: 15 mg/dL (ref 8–23)
CO2: 24 mmol/L (ref 22–32)
Calcium: 9.1 mg/dL (ref 8.9–10.3)
Chloride: 104 mmol/L (ref 98–111)
Creatinine: 1.05 mg/dL (ref 0.61–1.24)
GFR, Estimated: >60 mL/min (ref >60)
Glucose, Bld: 149 mg/dL — ABNORMAL HIGH (ref 70–99)
Potassium: 3.5 mmol/L (ref 3.5–5.1)
Sodium: 141 mmol/L (ref 135–145)
Total Bilirubin: 0.9 mg/dL (ref 0.0–1.2)
Total Protein: 6.8 g/dL (ref 6.5–8.1)

## 2024-03-31 LAB — MAGNESIUM: Magnesium: 2.1 mg/dL (ref 1.7–2.4)

## 2024-03-31 LAB — RETICULOCYTES
Immature Retic Fract: 25.7 % — ABNORMAL HIGH (ref 2.3–15.9)
RBC.: 3.19 MIL/uL — ABNORMAL LOW (ref 4.22–5.81)
Retic Count, Absolute: 138.8 K/uL (ref 19.0–186.0)
Retic Ct Pct: 4.4 % — ABNORMAL HIGH (ref 0.4–3.1)

## 2024-03-31 LAB — IRON AND IRON BINDING CAPACITY (CC-WL,HP ONLY)
Iron: 66 ug/dL (ref 45–182)
Saturation Ratios: 26 % (ref 17.9–39.5)
TIBC: 255 ug/dL (ref 250–450)
UIBC: 189 ug/dL

## 2024-03-31 LAB — SAMPLE TO BLOOD BANK

## 2024-03-31 LAB — FERRITIN: Ferritin: 1054 ng/mL — ABNORMAL HIGH (ref 24–336)

## 2024-03-31 MED ORDER — DEXAMETHASONE SOD PHOSPHATE PF 10 MG/ML IJ SOLN
10.0000 mg | Freq: Once | INTRAMUSCULAR | Status: AC
  Administered 2024-03-31: 10 mg via INTRAVENOUS

## 2024-03-31 MED ORDER — ATROPINE SULFATE 1 MG/ML IV SOLN
0.5000 mg | Freq: Once | INTRAVENOUS | Status: AC | PRN
  Administered 2024-03-31: 0.5 mg via INTRAVENOUS
  Filled 2024-03-31: qty 1

## 2024-03-31 MED ORDER — FLUOROURACIL CHEMO INJECTION 2.5 GM/50ML
400.0000 mg/m2 | Freq: Once | INTRAVENOUS | Status: AC
  Administered 2024-03-31: 850 mg via INTRAVENOUS
  Filled 2024-03-31: qty 17

## 2024-03-31 MED ORDER — PALONOSETRON HCL INJECTION 0.25 MG/5ML
0.2500 mg | Freq: Once | INTRAVENOUS | Status: AC
  Administered 2024-03-31: 0.25 mg via INTRAVENOUS
  Filled 2024-03-31: qty 5

## 2024-03-31 MED ORDER — SODIUM CHLORIDE 0.9 % IV SOLN
400.0000 mg/m2 | Freq: Once | INTRAVENOUS | Status: AC
  Administered 2024-03-31: 860 mg via INTRAVENOUS
  Filled 2024-03-31: qty 43

## 2024-03-31 MED ORDER — SODIUM CHLORIDE 0.9 % IV SOLN
144.0000 mg/m2 | Freq: Once | INTRAVENOUS | Status: AC
  Administered 2024-03-31: 300 mg via INTRAVENOUS
  Filled 2024-03-31: qty 15

## 2024-03-31 MED ORDER — SODIUM CHLORIDE 0.9 % IV SOLN: INTRAVENOUS | Status: DC

## 2024-03-31 MED ORDER — SODIUM CHLORIDE 0.9 % IV SOLN
1920.0000 mg/m2 | INTRAVENOUS | Status: DC
  Administered 2024-03-31: 4150 mg via INTRAVENOUS
  Filled 2024-03-31: qty 83

## 2024-03-31 NOTE — Progress Notes (Signed)
 Hematology and Oncology Follow Up Visit  John Douglas [id number] [birth year] 68 y.o. 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 twice daily Aspirin  81 mg p.o. daily FOLFOX/Vectibix  --start on 03/07/2023; s/p cycle #8 - d/c on 07/30/2023 Xgeva  120 mg subcu every 3 months-start 03/07/2023 - patient declined any further Xgeva . Lonsurf  60 mg po BID - start on 08/05/2023 -- changed on 09/20/2023 -- d/c on 11/25/2023 FOLFIRI-s/p  cycle #3- start  on 01/29/2024                           Interim History:  John Douglas is here today for follow-up.  He is doing better.  His blood counts are much better now.  We will go ahead with his treatment.  After this treatment, I will do a PET scan on him.  He is complaining of a little bit of back discomfort.  Hopefully, this is nothing in the back that we have to worry about.  His last CEA level was nice and low.  It was 1.16.  There is been no problems with bleeding.  He has had no melena or bright red blood per rectum.  He continues to have the atrial fibrillation.  He has had no rashes.  He has had no headache.  Overall, I would have to say that his performance status is probably ECOG 1.     Wt Readings from Last 3 Encounters:  03/31/24 212 lb (96.2 kg)  03/17/24 212 lb (96.2 kg)  03/03/24 211 lb (95.7 kg)   Medications:  Allergies as of 03/31/2024       Reactions   Celexa  [citalopram ] Other (See Comments)   Had a reaction to medication   Glucophage  [metformin] Diarrhea, Other (See Comments)   GI bleeding Blood stool   Yellow Jacket Venom [bee Venom] Swelling, Other (See Comments)   Severe swelling where stung   Xgeva  [denosumab ] Other (See Comments)   Runny nose and epistaxis  Lower back pain Watery discharge from eyes Raw sinuses   Statins    Significant myalgias         Medication List        Accurate as of March 31, 2024 10:25 AM. If you have any questions, ask your nurse or doctor.          Eliquis  5 MG Tabs tablet Generic drug: apixaban  TAKE 1 TABLET BY MOUTH TWICE A DAY   ezetimibe  10 MG tablet Commonly known as: ZETIA  Take 1 tablet by mouth once daily   folic acid  1 MG tablet Commonly known as: FOLVITE  TAKE 2 TABLETS BY MOUTH EVERY DAY   furosemide  20 MG tablet Commonly known as: LASIX  Take 1 tablet (20 mg total) by mouth daily.   glimepiride  1 MG tablet Commonly known as: AMARYL  Take 1 mg by mouth daily.   HYDROcodone -acetaminophen  5-325 MG tablet Commonly known as: NORCO/VICODIN Take 1 tablet by mouth every 6 (six) hours as needed.   Magnesium 200 MG Tabs Take 200 mg by mouth in the morning and at  bedtime.   metoprolol  succinate 50 MG 24 hr tablet Commonly known as: TOPROL -XL Take 50 mg by mouth daily.   potassium chloride  SA 20 MEQ tablet Commonly known as: Klor-Con  M20 Take 1 tablet (20 mEq total) by mouth daily.        Allergies:  Allergies  Allergen Reactions   Celexa  [Citalopram ] Other (See Comments)    Had a reaction to medication   Glucophage [Metformin] Diarrhea and Other (See Comments)    GI bleeding Blood stool   Yellow Jacket Venom [Bee Venom] Swelling and Other (See Comments)    Severe swelling where stung   Xgeva  [Denosumab ] Other (See Comments)    Runny nose and epistaxis  Lower back pain Watery discharge from eyes Raw sinuses   Statins     Significant myalgias     Past Medical History, Surgical history, Social history, and Family History were reviewed  and updated.  Review of Systems: Review of Systems  Constitutional: Negative.   HENT: Negative.    Eyes: Negative.   Respiratory: Negative.    Cardiovascular: Negative.   Gastrointestinal: Negative.   Genitourinary: Negative.   Musculoskeletal: Negative.   Skin: Negative.   Neurological: Negative.   Endo/Heme/Allergies: Negative.   Psychiatric/Behavioral: Negative.       Physical Exam:  height is 5' 10 (1.778 m) and weight is 212 lb (96.2 kg). His oral temperature is 98 F (36.7 C). His blood pressure is 139/92 (abnormal) and his pulse is 103 (abnormal). His respiration is 18 and oxygen saturation is 98%.   Wt Readings from Last 3 Encounters:  03/31/24 212 lb (96.2 kg)  03/17/24 212 lb (96.2 kg)  03/03/24 211 lb (95.7 kg)    Physical Exam Vitals reviewed.  HENT:     Head: Normocephalic and atraumatic.  Eyes:     Pupils: Pupils are equal, round, and reactive to light.  Cardiovascular:     Rate and Rhythm: Normal rate and regular rhythm.     Heart sounds: Normal heart sounds.  Pulmonary:     Effort: Pulmonary effort is normal.     Breath sounds: Normal breath sounds.  Abdominal:     General: Bowel sounds are normal.     Palpations: Abdomen is soft.  Musculoskeletal:        General: No tenderness or deformity. Normal range of motion.     Cervical back: Normal range of motion.  Lymphadenopathy:     Cervical: No cervical adenopathy.  Skin:    General: Skin is warm and dry.     Findings: No erythema or rash.  Neurological:     Mental Status: He is alert and oriented to person, place, and time.  Psychiatric:        Behavior: Behavior normal.        Thought Content: Thought content normal.        Judgment: Judgment normal.     Lab Results  Component Value Date   WBC 7.6 03/31/2024   HGB 10.0 (L) 03/31/2024   HCT 29.8 (L) 03/31/2024   MCV 93.7 03/31/2024   PLT 224 03/31/2024   Lab Results  Component Value Date   FERRITIN 1,018 (H) 03/17/2024   IRON 102  03/17/2024   TIBC 293 03/17/2024   UIBC 191 03/17/2024   IRONPCTSAT 35 03/17/2024   Lab Results  Component Value Date   RETICCTPCT 4.4 (H) 03/31/2024   RBC 3.19 (L) 03/31/2024   RBC 3.18 (L) 03/31/2024   No results found for: KPAFRELGTCHN, LAMBDASER,  KAPLAMBRATIO No results found for: IGGSERUM, IGA, IGMSERUM No results found for: STEPHANY CARLOTA BENSON MARKEL EARLA JOANNIE DOC VICK, SPEI   Chemistry      Component Value Date/Time   NA 141 03/31/2024 0923   NA 140 08/28/2019 0940   K 3.5 03/31/2024 0923   CL 104 03/31/2024 0923   CO2 24 03/31/2024 0923   BUN 15 03/31/2024 0923   BUN 21 08/28/2019 0940   CREATININE 1.05 03/31/2024 0923      Component Value Date/Time   CALCIUM  9.1 03/31/2024 0923   ALKPHOS 59 03/31/2024 0923   AST 18 03/31/2024 0923   ALT 8 03/31/2024 0923   BILITOT 0.9 03/31/2024 0923       Impression and Plan: John Douglas is a pleasant 68 yo caucasian gentleman with history of CVA secondary to atrial fib and rectal bleeding. He was diagnosed with metastatic adenocarcinoma of the rectum -K-ras wild type/BRAF wild-type/HER-2 negative/MMR proficient.   He had adenopathy distant to the rectum and now has had disease recurrence.    Again, we will go ahead with his fourth cycle of treatment.  We will then do a PET scan on him.  I will give him an extra week off.  Everything looks good from my point of view.  Glad he responded to the transfusion.  We will go ahead and plan to get him back in about 3 weeks.   Maude JONELLE Crease, MD 12/2/202510:25 AM

## 2024-03-31 NOTE — Patient Instructions (Signed)
 [facility] - A DEPT OF [facility]  Discharge Instructions: Thank you for choosing [facility] to provide your oncology and hematology care.   If you have a lab appointment with the Cancer Center, please go directly to the Cancer Center and check in at the registration area.  Wear comfortable clothing and clothing appropriate for easy access to any Portacath or PICC line.   We strive to give you quality time with your provider. You may need to reschedule your appointment if you arrive late (15 or more minutes).  Arriving late affects you and other patients whose appointments are after yours.  Also, if you miss three or more appointments without notifying the office, you may be dismissed from the clinic at the provider's discretion.      For prescription refill requests, have your pharmacy contact our office and allow 72 hours for refills to be completed.    Today you received the following chemotherapy and/or immunotherapy agents:  Irinotecan, Leucovorin and 5FU      To help prevent nausea and vomiting after your treatment, we encourage you to take your nausea medication as directed.  BELOW ARE SYMPTOMS THAT SHOULD BE REPORTED IMMEDIATELY: *FEVER GREATER THAN 100.4 F (38 C) OR HIGHER *CHILLS OR SWEATING *NAUSEA AND VOMITING THAT IS NOT CONTROLLED WITH YOUR NAUSEA MEDICATION *UNUSUAL SHORTNESS OF BREATH *UNUSUAL BRUISING OR BLEEDING *URINARY PROBLEMS (pain or burning when urinating, or frequent urination) *BOWEL PROBLEMS (unusual diarrhea, constipation, pain near the anus) TENDERNESS IN MOUTH AND THROAT WITH OR WITHOUT PRESENCE OF ULCERS (sore throat, sores in mouth, or a toothache) UNUSUAL RASH, SWELLING OR PAIN  UNUSUAL VAGINAL DISCHARGE OR ITCHING   Items with * indicate a potential emergency and should be followed up as soon as possible or go to the Emergency Department if any problems should occur.  Please show the CHEMOTHERAPY ALERT  CARD or IMMUNOTHERAPY ALERT CARD at check-in to the Emergency Department and triage nurse. Should you have questions after your visit or need to cancel or reschedule your appointment, please contact [facility] - A DEPT OF [facility]  [phone] and follow the prompts.  Office hours are 8:00 a.m. to 4:30 p.m. Monday - Friday. Please note that voicemails left after 4:00 p.m. may not be returned until the following business day.  We are closed weekends and major holidays. You have access to a nurse at all times for urgent questions. Please call the main number to the clinic [phone] and follow the prompts.  For any non-urgent questions, you may also contact your provider using MyChart. We now offer e-Visits for anyone 31 and older to request care online for non-urgent symptoms. For details visit [email or web address].   Also download the MyChart app! Go to the app store, search "MyChart", open the app, select [place], and log in with your MyChart username and password.

## 2024-03-31 NOTE — Patient Instructions (Signed)

## 2024-04-01 ENCOUNTER — Encounter: Payer: Self-pay | Admitting: Internal Medicine

## 2024-04-01 ENCOUNTER — Ambulatory Visit: Attending: Internal Medicine | Admitting: Internal Medicine

## 2024-04-01 VITALS — BP 136/88 | HR 85 | Ht 70.0 in | Wt 218.0 lb

## 2024-04-01 DIAGNOSIS — I5020 Unspecified systolic (congestive) heart failure: Secondary | ICD-10-CM | POA: Diagnosis not present

## 2024-04-01 DIAGNOSIS — I2510 Atherosclerotic heart disease of native coronary artery without angina pectoris: Secondary | ICD-10-CM

## 2024-04-01 DIAGNOSIS — I4821 Permanent atrial fibrillation: Secondary | ICD-10-CM

## 2024-04-01 MED ORDER — POTASSIUM CHLORIDE CRYS ER 20 MEQ PO TBCR: 20.0000 meq | EXTENDED_RELEASE_TABLET | Freq: Every day | ORAL | 3 refills | Status: AC

## 2024-04-01 NOTE — Progress Notes (Signed)
 Cardiology Office Note:  .   Date:  04/01/2024  ID:  John Douglas, MRN [id number] PCP: Caro Harlene POUR, NP   HeartCare Providers Cardiologist:  Lonni Hanson, MD     History of Present Illness: .   John Douglas is a 68 y.o. male with history of CAD with inferior STEMI and primary PCI to the RCA in 03/2019 complicated by sustained ventricular tachycardia in the setting of residual left coronary artery disease status post subsequent PCI to the mid LAD (06/2019), hypertension, hyperlipidemia, persistent atrial fibrillation complicated by stroke in 09/2019, ischemic cardiomyopathy, type 2 diabetes mellitus, and recurrent rectal cancer, who presents for follow-up of coronary artery disease and atrial fibrillation.  I last saw him in August following hospitalization in [place] with severe anemia.  It was felt that his anemia was due to bone marrow suppression, as upper and lower endoscopies did not reveal any source of bleeding.  Hospital course was complicated by atrial fibrillation with rapid ventricular response in the setting of hemoglobin as low as 3.6.  LVEF was also mildly reduced at 45-50% prompting switch from diltiazem  to metoprolol  for rate control.  At our follow-up visit, he was feeling well other than mild low back pain.  Chemotherapy was on hold due to persistent anemia.  Given intermittent soft blood pressures, we agreed to reduce furosemide  from 40 mg daily to 20 mg daily.  Today, John Douglas reports that he has been feeling okay.  His main complaint is of waxing and waning low back pain that is related to his recurrent rectal cancer.  He began his 4th cycle of chemo yesterday and feels like the low back pain has already started to improve.  He denies chest pain, shortness of breath, palpitations, lightheadedness, edema, and bleeding.  He remains on apixaban  with improved blood counts noted on most recent CBC through the Cancer Center.  He is schedule for PET/CT  for follow-up of his metastatic rectal cancer later this month.  ROS: See HPI  Studies Reviewed: SABRA   EKG Interpretation Date/Time:  Wednesday April 01 2024 16:39:32 EST Ventricular Rate:  85 PR Interval:    QRS Duration:  102 QT Interval:  402 QTC Calculation: 478 R Axis:   1  Text Interpretation: Atrial fibrillation  Inferior infarct (cited on or before 25-Dec-2023)  Nonspecific ST and T wave abnormality  Abnormal ECG When compared with ECG of 25-Dec-2023 14:31,  No significant change was found Confirmed by Renata Gambino ([id number]) on 04/01/2024 4:44:02 PM    TTE (12/04/2023): Normal LV size with mild LVH. LVEF 45-50% with global hypokinesis. Indeterminate diastolic parameters. Normal RV size and function. Moderate left atrial enlargement. No pericardial effusion. Aortic sclerosis without stenosis. Normal CVP.   Risk Assessment/Calculations:    CHA2DS2-VASc Score = 7   This indicates a 11.2% annual risk of stroke. The patient's score is based upon: CHF History: 1 HTN History: 1 Diabetes History: 1 Stroke History: 2 Vascular Disease History: 1 Age Score: 1 Gender Score: 0            Physical Exam:   VS:  BP 136/88 (BP Location: Left Arm, Cuff Size: Normal)   Pulse 85 Comment: 84 oximeter  Ht 5' 10 (1.778 m)   Wt 218 lb (98.9 kg)   SpO2 97%   BMI 31.28 kg/m    Wt Readings from Last 3 Encounters:  04/01/24 218 lb (98.9 kg)  03/31/24 212 lb (96.2 kg)  03/17/24 212 lb (96.2 kg)  General:  NAD. Neck: No JVD or HJR. Lungs: Clear to auscultation bilaterally without wheezes or crackles. Heart: Regular rate and rhythm without murmurs, rubs, or gallops. Abdomen: Soft, nontender, nondistended. Extremities: No lower extremity edema.  ASSESSMENT AND PLAN: .    CAD: No angina reported.  Continue apixaban  in lieu of aspirin  for secondary prevention as well as ezetimibe  given history of statin intolerance.  LDL noted to be 104 on last check in 07/2022.  Consider recheck  with next blood draw/PCP visit.  Given comorbidities, would favor deferring PCSK9 inhibitor with continued ezetimibe  therapy.  Permanent atrial fibrillation: Ventricular rate control remains adequately on metoprolol  succinate 50 mg daily.  Continue apixaban  5 mg twice daily as long as there is no evidence of bleeding; John Douglas is not interested in exploring left atrial appendage occlusion.  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  He appears euvolemic with NYHA class I symptoms.  Continue current dose of metoprolol  succinate.    Dispo: Return to clinic in 6 months.  Signed, Lonni Hanson, MD

## 2024-04-01 NOTE — Patient Instructions (Signed)

## 2024-04-02 ENCOUNTER — Inpatient Hospital Stay

## 2024-04-02 NOTE — Patient Instructions (Signed)

## 2024-04-03 ENCOUNTER — Other Ambulatory Visit: Payer: Self-pay

## 2024-04-03 ENCOUNTER — Encounter: Payer: Self-pay | Admitting: Internal Medicine

## 2024-04-03 DIAGNOSIS — I4821 Permanent atrial fibrillation: Secondary | ICD-10-CM | POA: Insufficient documentation

## 2024-04-08 ENCOUNTER — Other Ambulatory Visit: Payer: Self-pay | Admitting: Hematology & Oncology

## 2024-04-14 ENCOUNTER — Inpatient Hospital Stay

## 2024-04-14 ENCOUNTER — Inpatient Hospital Stay: Admitting: Medical Oncology

## 2024-04-16 ENCOUNTER — Inpatient Hospital Stay

## 2024-04-16 NOTE — Patient Instructions (Signed)
 1.) Visit your local pharmacy to receive COVID booster, Tetanus, and Shingles vaccine.

## 2024-04-17 ENCOUNTER — Ambulatory Visit: Admitting: Nurse Practitioner

## 2024-04-17 ENCOUNTER — Encounter: Payer: Self-pay | Admitting: Nurse Practitioner

## 2024-04-17 VITALS — BP 138/84 | HR 95 | Temp 98.0°F | Ht 70.0 in | Wt 213.0 lb

## 2024-04-17 DIAGNOSIS — D500 Iron deficiency anemia secondary to blood loss (chronic): Secondary | ICD-10-CM | POA: Diagnosis not present

## 2024-04-17 DIAGNOSIS — I5020 Unspecified systolic (congestive) heart failure: Secondary | ICD-10-CM

## 2024-04-17 DIAGNOSIS — Z7984 Long term (current) use of oral hypoglycemic drugs: Secondary | ICD-10-CM

## 2024-04-17 DIAGNOSIS — I4819 Other persistent atrial fibrillation: Secondary | ICD-10-CM | POA: Diagnosis not present

## 2024-04-17 DIAGNOSIS — C20 Malignant neoplasm of rectum: Secondary | ICD-10-CM

## 2024-04-17 DIAGNOSIS — Z789 Other specified health status: Secondary | ICD-10-CM

## 2024-04-17 DIAGNOSIS — I2510 Atherosclerotic heart disease of native coronary artery without angina pectoris: Secondary | ICD-10-CM | POA: Diagnosis not present

## 2024-04-17 DIAGNOSIS — E785 Hyperlipidemia, unspecified: Secondary | ICD-10-CM

## 2024-04-17 DIAGNOSIS — I10 Essential (primary) hypertension: Secondary | ICD-10-CM | POA: Diagnosis not present

## 2024-04-17 DIAGNOSIS — E1169 Type 2 diabetes mellitus with other specified complication: Secondary | ICD-10-CM

## 2024-04-17 NOTE — Progress Notes (Unsigned)
 "   Careteam: Patient Care Team: Caro Harlene POUR, NP as PCP - General (Geriatric Medicine) End, Lonni, MD as PCP - Cardiology (Cardiology) Timmy Maude SAUNDERS, MD as Medical Oncologist (Oncology)  PLACE OF SERVICE:  [facility]  Advanced Directive information    Allergies[1]  Chief Complaint  Patient presents with   Medical Management of Chronic Issues    6 weeks follow up. Discussed the need for Pneumonia vaccine, AWV, eye exam, diabetic kidney evaluation, td/tdap, covid booster, and shingles vaccine. Patient declines all vaccines. Also patient has seen ophthalmology 2months ago Dr. Delinda in [place].      HPI:  Discussed the use of AI scribe software for clinical note transcription with the patient, who gave verbal consent to proceed.  History of Present Illness John Douglas is a 68 year old male with metastatic carcinoma of the rectum who presents for a six-week follow-up regarding his cancer treatment and back pain.  He has significant lower back pain, which he attributes to the recurrence of his cancer on his spine. The pain is severe today, similar to when his cancer was previously active on his spine. He is currently taking Tylenol  for pain relief but has hydrocodone  prescribed, which he avoids due to its side effects. A PET scan is scheduled for next Tuesday.  He is undergoing chemotherapy for metastatic carcinoma of the rectum, with the next session scheduled for December 29th to 31st. His hemoglobin levels were low at 8 a few months ago, which led to a delay in treatment, but have since increased to 10. He has had blood transfusions in the past, which typically raise his hemoglobin by one point, although he does not feel a significant difference post-transfusion. His white blood cell count, which was low in November, has also improved.  He has a history of coronary artery disease and a previous STEMI. He is currently on metoprolol  at night, Eliquis  twice daily, Zetia   for cholesterol, and a diuretic in the morning. He also takes folic acid  twice in the morning. He is not concerned about his cholesterol levels due to his cancer diagnosis.  He has diabetes and is concerned about his glucose levels, which were slightly elevated during his last check. He is cautious about his carbohydrate and starch intake and takes 1 mg of glimepiride  daily. No hypoglycemia or low blood sugar episodes.  He experiences anxiety during medical visits, which he believes causes temporary elevations in his blood pressure. His blood pressure is typically around 130/84 mmHg, but it was slightly higher today. His blood pressure usually normalizes after some time during medical appointments.  ***  Review of Systems:  Review of Systems  Constitutional:  Negative for chills, fever and weight loss.  HENT:  Negative for tinnitus.   Respiratory:  Negative for cough, sputum production and shortness of breath.   Cardiovascular:  Negative for chest pain, palpitations and leg swelling.  Gastrointestinal:  Negative for abdominal pain, constipation, diarrhea and heartburn.  Genitourinary:  Negative for dysuria, frequency and urgency.  Musculoskeletal:  Positive for back pain. Negative for falls, joint pain and myalgias.  Skin: Negative.   Neurological:  Negative for dizziness and headaches.  Psychiatric/Behavioral:  Negative for depression and memory loss. The patient does not have insomnia.   ***  Past Medical History:  Diagnosis Date   Allergic rhinitis    [facility]   Anxiety    Benign essential hypertension    [facility]   Colon cancer (  HCC) 2021   Coronary artery disease    [facility]   Diabetic neuropathy, type II diabetes mellitus (HCC)    [facility]   Dysrhythmia    Iron deficiency anemia due to chronic blood loss 11/18/2019   Mixed hyperlipidemia    [facility]    Myocardial infarct [facility])    [facility]   Obesity, unspecified    [facility]   Persistent atrial fibrillation [facility])    Rectal bleeding    [facility]   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) 11/17/2019   Stroke (HCC)    Wide-complex tachycardia 06/04/2019   Past Surgical History:  Procedure Laterality Date   BIOPSY  10/07/2019   Procedure: BIOPSY;  Surgeon: San Sandor GAILS, DO;  Location: [facility] ENDOSCOPY;  Service: Gastroenterology;;   COLONOSCOPY N/A 12/06/2023   Procedure: COLONOSCOPY;  Surgeon: Albertus Gordy HERO, MD;  Location: [facility] ENDOSCOPY;  Service: Gastroenterology;  Laterality: N/A;   COLONOSCOPY WITH PROPOFOL  N/A 10/07/2019   Procedure: COLONOSCOPY WITH PROPOFOL ;  Surgeon: San Sandor GAILS, DO;  Location: [facility] ENDOSCOPY;  Service: Gastroenterology;  Laterality: N/A;   CORONARY ANGIOPLASTY     3 stents   ESOPHAGOGASTRODUODENOSCOPY N/A 12/06/2023   Procedure: EGD (ESOPHAGOGASTRODUODENOSCOPY);  Surgeon: Albertus Gordy HERO, MD;  Location: [facility] ENDOSCOPY;  Service: Gastroenterology;  Laterality: N/A;   IR IMAGING GUIDED PORT INSERTION  11/24/2019   LEFT HEART CATH AND CORONARY ANGIOGRAPHY N/A 06/05/2019   Procedure: LEFT HEART CATH AND CORONARY ANGIOGRAPHY;  Surgeon: Mady Bruckner, MD;  Location: [facility] INVASIVE CV LAB;  Service: Cardiovascular;  Laterality: N/A;   POLYPECTOMY  10/07/2019   Procedure: POLYPECTOMY;  Surgeon: San Sandor GAILS, DO;  Location: [facility] ENDOSCOPY;  Service: Gastroenterology;;   SUBMUCOSAL TATTOO INJECTION  10/07/2019   Procedure: SUBMUCOSAL TATTOO INJECTION;  Surgeon: San Sandor GAILS, DO;  Location: [facility] ENDOSCOPY;  Service: Gastroenterology;;   Social History:   reports that he has never smoked. He quit smokeless tobacco use about 4 years ago.  His smokeless tobacco use included chew. He reports that he does not currently use alcohol. He reports current drug use. Drug:  Marijuana.  Family History  Problem Relation Age of Onset   Diverticulitis Mother    Hypertension Father    Heart disease Father        MI   Prostate cancer Father    Colon cancer Neg Hx    Stomach cancer Neg Hx    Pancreatic cancer Neg Hx    Rectal cancer Neg Hx     Medications: Patient's Medications  New Prescriptions   No medications on file  Previous Medications   ELIQUIS  5 MG TABS TABLET    TAKE 1 TABLET BY MOUTH TWICE A DAY   EZETIMIBE  (ZETIA ) 10 MG TABLET    Take 1 tablet by mouth once daily   FOLIC ACID  (FOLVITE ) 1 MG TABLET    TAKE 2 TABLETS BY MOUTH EVERY DAY   FUROSEMIDE  (LASIX ) 20 MG TABLET    Take 1 tablet (20 mg total) by mouth daily.   GLIMEPIRIDE  (AMARYL ) 1 MG TABLET    Take 1 mg by mouth daily.   HYDROCODONE -ACETAMINOPHEN  (NORCO/VICODIN) 5-325 MG TABLET    Take 1 tablet by mouth every 6 (six) hours as needed.   MAGNESIUM 200 MG TABS    Take 200 mg by mouth in the morning and at bedtime.   METOPROLOL  SUCCINATE (TOPROL -XL) 50 MG 24 HR  TABLET    Take 50 mg by mouth daily.   POTASSIUM CHLORIDE  SA (KLOR-CON  M20) 20 MEQ TABLET    Take 1 tablet (20 mEq total) by mouth daily.  Modified Medications   No medications on file  Discontinued Medications   No medications on file    Physical Exam:  Vitals:   04/17/24 0817  BP: 138/84  Pulse: 95  Temp: 98 F (36.7 C)  SpO2: 98%  Weight: 213 lb (96.6 kg)  Height: 5' 10 (1.778 m)   Body mass index is 30.56 kg/m. Wt Readings from Last 3 Encounters:  04/17/24 213 lb (96.6 kg)  04/01/24 218 lb (98.9 kg)  03/31/24 212 lb (96.2 kg)    Physical Exam Constitutional:      General: He is not in acute distress.    Appearance: He is well-developed. He is not diaphoretic.  HENT:     Head: Normocephalic and atraumatic.     Right Ear: External ear normal.     Left Ear: External ear normal.     Mouth/Throat:     Pharynx: No oropharyngeal exudate.  Eyes:     Conjunctiva/sclera: Conjunctivae normal.     Pupils: Pupils  are equal, round, and reactive to light.  Cardiovascular:     Rate and Rhythm: Normal rate. Rhythm irregular.     Heart sounds: Normal heart sounds.  Pulmonary:     Effort: Pulmonary effort is normal.     Breath sounds: Normal breath sounds.  Abdominal:     General: Bowel sounds are normal.     Palpations: Abdomen is soft.  Musculoskeletal:        General: No tenderness.     Cervical back: Normal range of motion and neck supple.     Right lower leg: No edema.     Left lower leg: No edema.  Skin:    General: Skin is warm and dry.  Neurological:     Mental Status: He is alert and oriented to person, place, and time.   ***  Labs reviewed: Basic Metabolic Panel: Recent Labs    12/07/23 0400 12/10/23 1117 12/25/23 1011 03/03/24 1001 03/17/24 0945 03/31/24 0923  NA 140 141   < > 141 144 141  K 3.6 3.5   < > 4.0 3.3* 3.5  CL 110 102   < > 106 106 104  CO2 23 26   < > 26 26 24   GLUCOSE 178* 146*   < > 111* 127* 149*  BUN 22 17   < > 20 16 15   CREATININE 1.29* 0.98   < > 1.05 1.12 1.05  CALCIUM  8.5* 9.0   < > 9.4 8.9 9.1  MG 2.1 2.0  --   --   --  2.1   < > = values in this interval not displayed.   Liver Function Tests: Recent Labs    03/03/24 1001 03/17/24 0945 03/31/24 0923  AST 20 17 18   ALT 12 11 8   ALKPHOS 67 57 59  BILITOT 1.3* 1.1 0.9  PROT 6.8 6.4* 6.8  ALBUMIN 4.3 4.1 4.1   No results for input(s): LIPASE, AMYLASE in the last 8760 hours. No results for input(s): AMMONIA in the last 8760 hours. CBC: Recent Labs    03/03/24 1001 03/17/24 0945 03/31/24 0923  WBC 3.1* 1.9* 7.6  NEUTROABS 2.0 1.0* 6.0  HGB 9.2* 8.0* 10.0*  HCT 27.8* 24.1* 29.8*  MCV 95.9 98.0 93.7  PLT 148* 125* 224   Lipid Panel:  No results for input(s): CHOL, HDL, LDLCALC, TRIG, CHOLHDL, LDLDIRECT in the last 8760 hours. TSH: No results for input(s): TSH in the last 8760 hours. A1C: Lab Results  Component Value Date   HGBA1C 6.0 (H) 12/04/2023      Assessment/Plan *** There are no diagnoses linked to this encounter.   No follow-ups on file.: ***  Chay Mazzoni K. Caro BODILY [facility] [phone]      [1]  Allergies Allergen Reactions   Celexa  [Citalopram ] Other (See Comments)    Had a reaction to medication   Glucophage [Metformin] Diarrhea and Other (See Comments)    GI bleeding Blood stool   Yellow Jacket Venom [Bee Venom] Swelling and Other (See Comments)    Severe swelling where stung   Xgeva  [Denosumab ] Other (See Comments)    Runny nose and epistaxis  Lower back pain Watery discharge from eyes Raw sinuses   Statins     Significant myalgias    "

## 2024-04-18 ENCOUNTER — Other Ambulatory Visit: Payer: Self-pay

## 2024-04-18 LAB — MICROALBUMIN / CREATININE URINE RATIO
Creatinine, Urine: 190 mg/dL (ref 20–320)
Microalb Creat Ratio: 9 mg/g{creat}
Microalb, Ur: 1.8 mg/dL

## 2024-04-20 ENCOUNTER — Encounter (HOSPITAL_COMMUNITY)
Admission: RE | Admit: 2024-04-20 | Discharge: 2024-04-20 | Disposition: A | Discharge status: Home / Self Care | Source: Ambulatory Visit | Attending: Hematology & Oncology | Admitting: Hematology & Oncology

## 2024-04-20 DIAGNOSIS — C20 Malignant neoplasm of rectum: Secondary | ICD-10-CM | POA: Diagnosis present

## 2024-04-20 LAB — GLUCOSE, CAPILLARY: Glucose-Capillary: 104 mg/dL — ABNORMAL HIGH (ref 70–99)

## 2024-04-20 MED ORDER — FLUDEOXYGLUCOSE F - 18 (FDG) INJECTION
10.6500 | Freq: Once | INTRAVENOUS | Status: AC
  Administered 2024-04-20: 10.56 via INTRAVENOUS

## 2024-04-21 ENCOUNTER — Other Ambulatory Visit: Payer: Self-pay | Admitting: *Deleted

## 2024-04-21 MED ORDER — HYDROCODONE-ACETAMINOPHEN 5-325 MG PO TABS: 1.0000 | ORAL_TABLET | Freq: Four times a day (QID) | ORAL | 0 refills | Status: DC | PRN

## 2024-04-27 ENCOUNTER — Inpatient Hospital Stay: Admitting: Hematology & Oncology

## 2024-04-27 ENCOUNTER — Inpatient Hospital Stay

## 2024-04-27 ENCOUNTER — Encounter: Payer: Self-pay | Admitting: Hematology & Oncology

## 2024-04-27 ENCOUNTER — Ambulatory Visit: Payer: Self-pay | Admitting: Hematology & Oncology

## 2024-04-27 VITALS — BP 116/77 | HR 76 | Temp 98.8°F | Resp 18 | Ht 70.0 in | Wt 209.0 lb

## 2024-04-27 DIAGNOSIS — C20 Malignant neoplasm of rectum: Secondary | ICD-10-CM

## 2024-04-27 DIAGNOSIS — E785 Hyperlipidemia, unspecified: Secondary | ICD-10-CM

## 2024-04-27 DIAGNOSIS — Z5111 Encounter for antineoplastic chemotherapy: Secondary | ICD-10-CM | POA: Diagnosis not present

## 2024-04-27 LAB — CMP (CANCER CENTER ONLY)
ALT: 9 U/L (ref 0–44)
AST: 21 U/L (ref 15–41)
Albumin: 4.4 g/dL (ref 3.5–5.0)
Alkaline Phosphatase: 56 U/L (ref 38–126)
Anion gap: 12 (ref 5–15)
BUN: 11 mg/dL (ref 8–23)
CO2: 24 mmol/L (ref 22–32)
Calcium: 9.4 mg/dL (ref 8.9–10.3)
Chloride: 104 mmol/L (ref 98–111)
Creatinine: 0.94 mg/dL (ref 0.61–1.24)
GFR, Estimated: >60 mL/min
Glucose, Bld: 140 mg/dL — ABNORMAL HIGH (ref 70–99)
Potassium: 3.6 mmol/L (ref 3.5–5.1)
Sodium: 139 mmol/L (ref 135–145)
Total Bilirubin: 1.4 mg/dL — ABNORMAL HIGH (ref 0.0–1.2)
Total Protein: 7.3 g/dL (ref 6.5–8.1)

## 2024-04-27 LAB — CBC WITH DIFFERENTIAL (CANCER CENTER ONLY)
Abs Immature Granulocytes: 0.07 K/uL (ref 0.00–0.07)
Basophils Absolute: 0.1 K/uL (ref 0.0–0.1)
Basophils Relative: 1 %
Eosinophils Absolute: 0.1 K/uL (ref 0.0–0.5)
Eosinophils Relative: 2 %
HCT: 31.3 % — ABNORMAL LOW (ref 39.0–52.0)
Hemoglobin: 10.5 g/dL — ABNORMAL LOW (ref 13.0–17.0)
Immature Granulocytes: 1 %
Lymphocytes Relative: 14 %
Lymphs Abs: 1 K/uL (ref 0.7–4.0)
MCH: 31.8 pg (ref 26.0–34.0)
MCHC: 33.5 g/dL (ref 30.0–36.0)
MCV: 94.8 fL (ref 80.0–100.0)
Monocytes Absolute: 0.5 K/uL (ref 0.1–1.0)
Monocytes Relative: 7 %
Neutro Abs: 5.6 K/uL (ref 1.7–7.7)
Neutrophils Relative %: 75 %
Platelet Count: 203 K/uL (ref 150–400)
RBC: 3.3 MIL/uL — ABNORMAL LOW (ref 4.22–5.81)
RDW: 19.2 % — ABNORMAL HIGH (ref 11.5–15.5)
WBC Count: 7.4 K/uL (ref 4.0–10.5)
nRBC: 0 % (ref 0.0–0.2)

## 2024-04-27 LAB — IRON AND IRON BINDING CAPACITY (CC-WL,HP ONLY)
Iron: 82 ug/dL (ref 45–182)
Saturation Ratios: 30 % (ref 17.9–39.5)
TIBC: 273 ug/dL (ref 250–450)
UIBC: 191 ug/dL

## 2024-04-27 LAB — SAMPLE TO BLOOD BANK

## 2024-04-27 LAB — CEA (ACCESS): CEA (CHCC): 1.29 ng/mL (ref 0.00–5.00)

## 2024-04-27 LAB — FERRITIN: Ferritin: 1190 ng/mL — ABNORMAL HIGH (ref 24–336)

## 2024-04-27 MED ORDER — FLUOROURACIL CHEMO INJECTION 2.5 GM/50ML
400.0000 mg/m2 | Freq: Once | INTRAVENOUS | Status: AC
  Administered 2024-04-27: 850 mg via INTRAVENOUS
  Filled 2024-04-27: qty 17

## 2024-04-27 MED ORDER — LEUCOVORIN CALCIUM INJECTION 350 MG
400.0000 mg/m2 | Freq: Once | INTRAMUSCULAR | Status: AC
  Administered 2024-04-27: 860 mg via INTRAVENOUS
  Filled 2024-04-27: qty 43

## 2024-04-27 MED ORDER — SODIUM CHLORIDE 0.9 % IV SOLN
144.0000 mg/m2 | Freq: Once | INTRAVENOUS | Status: AC
  Administered 2024-04-27: 300 mg via INTRAVENOUS
  Filled 2024-04-27: qty 15

## 2024-04-27 MED ORDER — SODIUM CHLORIDE 0.9 % IV SOLN
1920.0000 mg/m2 | INTRAVENOUS | Status: DC
  Administered 2024-04-27: 4150 mg via INTRAVENOUS
  Filled 2024-04-27: qty 83

## 2024-04-27 MED ORDER — PALONOSETRON HCL INJECTION 0.25 MG/5ML
0.2500 mg | Freq: Once | INTRAVENOUS | Status: AC
  Administered 2024-04-27: 0.25 mg via INTRAVENOUS
  Filled 2024-04-27: qty 5

## 2024-04-27 MED ORDER — DEXAMETHASONE SOD PHOSPHATE PF 10 MG/ML IJ SOLN
10.0000 mg | Freq: Once | INTRAMUSCULAR | Status: AC
  Administered 2024-04-27: 10 mg via INTRAVENOUS

## 2024-04-27 MED ORDER — SODIUM CHLORIDE 0.9 % IV SOLN: INTRAVENOUS | Status: DC

## 2024-04-27 MED ORDER — ATROPINE SULFATE 1 MG/ML IV SOLN
0.5000 mg | Freq: Once | INTRAVENOUS | Status: AC | PRN
  Administered 2024-04-27: 0.5 mg via INTRAVENOUS
  Filled 2024-04-27: qty 1

## 2024-04-27 NOTE — Addendum Note (Signed)
 Addended by: TIMMY COY R on: 04/27/2024 12:42 PM   Modules accepted: Orders

## 2024-04-27 NOTE — Progress Notes (Signed)
 " Hematology and Oncology Follow Up Visit  John Douglas [id number] 1955/09/06 68 y.o. 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 twice daily Aspirin  81 mg p.o. daily FOLFOX/Vectibix  --start on 03/07/2023; s/p cycle #8 - d/c on 07/30/2023 Xgeva  120 mg subcu every 3 months-start 03/07/2023 - patient declined any further Xgeva . Lonsurf  60 mg po BID - start on 08/05/2023 -- changed on 09/20/2023 -- d/c on 11/25/2023 FOLFIRI-s/p  cycle #3- start  on 01/29/2024                           Interim History:  John Douglas is here today for follow-up.  He said he has severe lower back pain yesterday.  He said there was no falls.  There is no trauma.  This happened at night.  It was quite severe.  I think it is okay hydrocodone  for this.  He is doing much better today.  He did have a PET scan done.  This was done 04/20/2024.  This showed improving anorectal hypermetabolism.  There is some small to minimally hypermetabolic retroperitoneal lymph nodes.  There is some degenerative changes in his spine.  His liver still looks fine.  As such, I have to believe that treatment is working.  He is doing well with treatment.  He has had no obvious bleeding.  His hemoglobin actually is the best that has been for quite a while.  His last CEA level was 1.16.  His appetite has been quite good.  He has had no change in bowel or bladder habits.  He has had no melena or bright red blood per rectum.  There is been no leg swelling.   He has had no rashes.  He has had no mouth sores.  He has had no fever.  He says quite a few of his family members are sick.  He is trying to avoid them.  Overall, I would have to say that his performance status is ECOG 1.   Wt Readings from Last 3 Encounters:  04/27/24 209 lb (94.8 kg)  04/17/24 213 lb (96.6 kg)  04/01/24 218 lb (98.9 kg)   Medications:  Allergies as of 04/27/2024       Reactions   Celexa  [citalopram ] Other (See Comments)   Had a reaction to medication   Glucophage [metformin] Diarrhea, Other (See Comments)   GI bleeding Blood stool   Yellow Jacket Venom [bee Venom] Swelling, Other (See Comments)   Severe swelling where stung   Xgeva  [denosumab ] Other (See Comments)   Runny nose and epistaxis  Lower back pain Watery discharge from eyes Raw sinuses   Statins    Significant myalgias         Medication List        Accurate as of April 27, 2024 10:33 AM. If you have any questions, ask your nurse or doctor.          Eliquis  5 MG Tabs tablet Generic drug: apixaban  TAKE 1 TABLET BY MOUTH TWICE A DAY   ezetimibe  10 MG tablet  Commonly known as: ZETIA  Take 1 tablet by mouth once daily   folic acid  1 MG tablet Commonly known as: FOLVITE  TAKE 2 TABLETS BY MOUTH EVERY DAY   furosemide  20 MG tablet Commonly known as: LASIX  Take 1 tablet (20 mg total) by mouth daily.   glimepiride  1 MG tablet Commonly known as: AMARYL  Take 1 mg by mouth daily.   HYDROcodone -acetaminophen  5-325 MG tablet Commonly known as: NORCO/VICODIN Take 1 tablet by mouth every 6 (six) hours as needed.   Magnesium 200 MG Tabs Take 200 mg by mouth in the morning and at bedtime.   metoprolol  succinate 50 MG 24 hr tablet Commonly known as: TOPROL -XL Take 50 mg by mouth daily.   potassium chloride  SA 20 MEQ tablet Commonly known as: Klor-Con  M20 Take 1 tablet (20 mEq total) by mouth daily.        Allergies:  Allergies  Allergen Reactions   Celexa  [Citalopram ]  Other (See Comments)    Had a reaction to medication   Glucophage [Metformin] Diarrhea and Other (See Comments)    GI bleeding Blood stool   Yellow Jacket Venom [Bee Venom] Swelling and Other (See Comments)    Severe swelling where stung   Xgeva  [Denosumab ] Other (See Comments)    Runny nose and epistaxis  Lower back pain Watery discharge from eyes Raw sinuses   Statins     Significant myalgias     Past Medical History, Surgical history, Social history, and Family History were reviewed and updated.  Review of Systems: Review of Systems  Constitutional: Negative.   HENT: Negative.    Eyes: Negative.   Respiratory: Negative.    Cardiovascular: Negative.   Gastrointestinal: Negative.   Genitourinary: Negative.   Musculoskeletal: Negative.   Skin: Negative.   Neurological: Negative.   Endo/Heme/Allergies: Negative.   Psychiatric/Behavioral: Negative.       Physical Exam:  height is 5' 10 (1.778 m) and weight is 209 lb (94.8 kg). His oral temperature is 98.8 F (37.1 C). His blood pressure is 116/77 and his pulse is 76. His respiration is 18 and oxygen saturation is 100%.   Wt Readings from Last 3 Encounters:  04/27/24 209 lb (94.8 kg)  04/17/24 213 lb (96.6 kg)  04/01/24 218 lb (98.9 kg)    Physical Exam Vitals reviewed.  HENT:     Head: Normocephalic and atraumatic.  Eyes:     Pupils: Pupils are equal, round, and reactive to light.  Cardiovascular:     Rate and Rhythm: Normal rate and regular rhythm.     Heart sounds: Normal heart sounds.  Pulmonary:     Effort: Pulmonary effort is normal.     Breath sounds: Normal breath sounds.  Abdominal:     General: Bowel sounds are normal.     Palpations: Abdomen is soft.  Musculoskeletal:        General: No tenderness or deformity. Normal range of motion.     Cervical back: Normal range of motion.  Lymphadenopathy:     Cervical: No cervical adenopathy.  Skin:    General: Skin is warm and dry.     Findings: No  erythema or rash.  Neurological:     Mental Status: He is alert and oriented to person, place, and time.  Psychiatric:        Behavior: Behavior normal.        Thought Content: Thought content normal.        Judgment: Judgment normal.     Lab Results  Component Value Date   WBC 7.4 04/27/2024   HGB 10.5 (L) 04/27/2024   HCT 31.3 (L) 04/27/2024   MCV 94.8 04/27/2024   PLT 203 04/27/2024   Lab Results  Component Value Date   FERRITIN 1,054 (H) 03/31/2024   IRON 66 03/31/2024   TIBC 255 03/31/2024   UIBC 189 03/31/2024   IRONPCTSAT 26 03/31/2024   Lab Results  Component Value Date   RETICCTPCT 4.4 (H) 03/31/2024   RBC 3.30 (L) 04/27/2024   No results found for: KPAFRELGTCHN, LAMBDASER, KAPLAMBRATIO No results found for: IGGSERUM, IGA, IGMSERUM No results found for: STEPHANY CARLOTA BENSON MARKEL EARLA JOANNIE DOC VICK, SPEI   Chemistry      Component Value Date/Time   NA 141 03/31/2024 0923   NA 140 08/28/2019 0940   K 3.5 03/31/2024 0923   CL 104 03/31/2024 0923   CO2 24 03/31/2024 0923   BUN 15 03/31/2024 0923   BUN 21 08/28/2019 0940   CREATININE 1.05 03/31/2024 0923      Component Value Date/Time   CALCIUM  9.1 03/31/2024 0923   ALKPHOS 59 03/31/2024 0923   AST 18 03/31/2024 0923   ALT 8 03/31/2024 0923   BILITOT 0.9 03/31/2024 0923       Impression and Plan: Mr. Stockley is a pleasant 68 yo caucasian gentleman with history of CVA secondary to atrial fib and rectal bleeding. He was diagnosed with metastatic adenocarcinoma of the rectum -K-ras wild type/BRAF wild-type/HER-2 negative/MMR proficient.   He had adenopathy distant to the rectum and now has had disease recurrence.    I think that by the PET scan he is responding.  Again, I do not think that the cancer was the cause of his back pain.  It resolved on its own.  Again we should continue him on treatment.  It does seem to be working.  I am happy about  that.  We will go ahead and plan to get him back in another couple weeks.   Maude JONELLE Crease, MD 12/29/202510:33 AM  "

## 2024-04-27 NOTE — Patient Instructions (Signed)
 [facility] - A DEPT OF [facility]  Discharge Instructions: Thank you for choosing [facility] to provide your oncology and hematology care.   If you have a lab appointment with the Cancer Center, please go directly to the Cancer Center and check in at the registration area.  Wear comfortable clothing and clothing appropriate for easy access to any Portacath or PICC line.   We strive to give you quality time with your provider. You may need to reschedule your appointment if you arrive late (15 or more minutes).  Arriving late affects you and other patients whose appointments are after yours.  Also, if you miss three or more appointments without notifying the office, you may be dismissed from the clinic at the provider's discretion.      For prescription refill requests, have your pharmacy contact our office and allow 72 hours for refills to be completed.    Today you received the following chemotherapy and/or immunotherapy agents Irinotecan ,. Leucovorin , 5FU.      To help prevent nausea and vomiting after your treatment, we encourage you to take your nausea medication as directed.  BELOW ARE SYMPTOMS THAT SHOULD BE REPORTED IMMEDIATELY: *FEVER GREATER THAN 100.4 F (38 C) OR HIGHER *CHILLS OR SWEATING *NAUSEA AND VOMITING THAT IS NOT CONTROLLED WITH YOUR NAUSEA MEDICATION *UNUSUAL SHORTNESS OF BREATH *UNUSUAL BRUISING OR BLEEDING *URINARY PROBLEMS (pain or burning when urinating, or frequent urination) *BOWEL PROBLEMS (unusual diarrhea, constipation, pain near the anus) TENDERNESS IN MOUTH AND THROAT WITH OR WITHOUT PRESENCE OF ULCERS (sore throat, sores in mouth, or a toothache) UNUSUAL RASH, SWELLING OR PAIN  UNUSUAL VAGINAL DISCHARGE OR ITCHING   Items with * indicate a potential emergency and should be followed up as soon as possible or go to the Emergency Department if any problems should occur.  Please show the CHEMOTHERAPY ALERT CARD  or IMMUNOTHERAPY ALERT CARD at check-in to the Emergency Department and triage nurse. Should you have questions after your visit or need to cancel or reschedule your appointment, please contact [facility] - A DEPT OF [facility]  [phone] and follow the prompts.  Office hours are 8:00 a.m. to 4:30 p.m. Monday - Friday. Please note that voicemails left after 4:00 p.m. may not be returned until the following business day.  We are closed weekends and major holidays. You have access to a nurse at all times for urgent questions. Please call the main number to the clinic [phone] and follow the prompts.  For any non-urgent questions, you may also contact your provider using MyChart. We now offer e-Visits for anyone 58 and older to request care online for non-urgent symptoms. For details visit [email or web address].   Also download the MyChart app! Go to the app store, search MyChart, open the app, select [facility], and log in with your MyChart username and password.

## 2024-04-27 NOTE — Patient Instructions (Signed)
 [facility] - A DEPT OF [facility]  Discharge Instructions: Thank you for choosing [facility] to provide your oncology and hematology care.   If you have a lab appointment with the Cancer Center, please go directly to the Cancer Center and check in at the registration area.  Wear comfortable clothing and clothing appropriate for easy access to any Portacath or PICC line.   We strive to give you quality time with your provider. You may need to reschedule your appointment if you arrive late (15 or more minutes).  Arriving late affects you and other patients whose appointments are after yours.  Also, if you miss three or more appointments without notifying the office, you may be dismissed from the clinic at the provider's discretion.      For prescription refill requests, have your pharmacy contact our office and allow 72 hours for refills to be completed.    Today you received the following chemotherapy and/or immunotherapy agents:  Irinotecan, Leucovorin and 5FU      To help prevent nausea and vomiting after your treatment, we encourage you to take your nausea medication as directed.  BELOW ARE SYMPTOMS THAT SHOULD BE REPORTED IMMEDIATELY: *FEVER GREATER THAN 100.4 F (38 C) OR HIGHER *CHILLS OR SWEATING *NAUSEA AND VOMITING THAT IS NOT CONTROLLED WITH YOUR NAUSEA MEDICATION *UNUSUAL SHORTNESS OF BREATH *UNUSUAL BRUISING OR BLEEDING *URINARY PROBLEMS (pain or burning when urinating, or frequent urination) *BOWEL PROBLEMS (unusual diarrhea, constipation, pain near the anus) TENDERNESS IN MOUTH AND THROAT WITH OR WITHOUT PRESENCE OF ULCERS (sore throat, sores in mouth, or a toothache) UNUSUAL RASH, SWELLING OR PAIN  UNUSUAL VAGINAL DISCHARGE OR ITCHING   Items with * indicate a potential emergency and should be followed up as soon as possible or go to the Emergency Department if any problems should occur.  Please show the CHEMOTHERAPY ALERT  CARD or IMMUNOTHERAPY ALERT CARD at check-in to the Emergency Department and triage nurse. Should you have questions after your visit or need to cancel or reschedule your appointment, please contact [facility] - A DEPT OF [facility]  [phone] and follow the prompts.  Office hours are 8:00 a.m. to 4:30 p.m. Monday - Friday. Please note that voicemails left after 4:00 p.m. may not be returned until the following business day.  We are closed weekends and major holidays. You have access to a nurse at all times for urgent questions. Please call the main number to the clinic [phone] and follow the prompts.  For any non-urgent questions, you may also contact your provider using MyChart. We now offer e-Visits for anyone 31 and older to request care online for non-urgent symptoms. For details visit [email or web address].   Also download the MyChart app! Go to the app store, search "MyChart", open the app, select [place], and log in with your MyChart username and password.

## 2024-04-29 ENCOUNTER — Inpatient Hospital Stay

## 2024-04-29 NOTE — Patient Instructions (Signed)

## 2024-05-12 ENCOUNTER — Inpatient Hospital Stay: Admitting: Hematology & Oncology

## 2024-05-12 ENCOUNTER — Inpatient Hospital Stay: Attending: Hematology & Oncology

## 2024-05-12 ENCOUNTER — Ambulatory Visit: Payer: Self-pay | Admitting: Hematology & Oncology

## 2024-05-12 ENCOUNTER — Inpatient Hospital Stay: Admitting: Family

## 2024-05-12 ENCOUNTER — Inpatient Hospital Stay

## 2024-05-12 ENCOUNTER — Other Ambulatory Visit: Payer: Self-pay

## 2024-05-12 ENCOUNTER — Other Ambulatory Visit: Payer: Self-pay | Admitting: Hematology & Oncology

## 2024-05-12 ENCOUNTER — Encounter: Payer: Self-pay | Admitting: Family

## 2024-05-12 VITALS — BP 145/86 | HR 56 | Temp 98.4°F | Resp 18 | Ht 70.0 in | Wt 211.0 lb

## 2024-05-12 DIAGNOSIS — D701 Agranulocytosis secondary to cancer chemotherapy: Secondary | ICD-10-CM | POA: Insufficient documentation

## 2024-05-12 DIAGNOSIS — Z7901 Long term (current) use of anticoagulants: Secondary | ICD-10-CM | POA: Insufficient documentation

## 2024-05-12 DIAGNOSIS — G8929 Other chronic pain: Secondary | ICD-10-CM | POA: Insufficient documentation

## 2024-05-12 DIAGNOSIS — C20 Malignant neoplasm of rectum: Secondary | ICD-10-CM

## 2024-05-12 DIAGNOSIS — I4891 Unspecified atrial fibrillation: Secondary | ICD-10-CM | POA: Diagnosis not present

## 2024-05-12 DIAGNOSIS — D6481 Anemia due to antineoplastic chemotherapy: Secondary | ICD-10-CM | POA: Diagnosis not present

## 2024-05-12 DIAGNOSIS — R5382 Chronic fatigue, unspecified: Secondary | ICD-10-CM | POA: Diagnosis not present

## 2024-05-12 DIAGNOSIS — E611 Iron deficiency: Secondary | ICD-10-CM | POA: Insufficient documentation

## 2024-05-12 DIAGNOSIS — Z7982 Long term (current) use of aspirin: Secondary | ICD-10-CM | POA: Insufficient documentation

## 2024-05-12 DIAGNOSIS — T451X5A Adverse effect of antineoplastic and immunosuppressive drugs, initial encounter: Secondary | ICD-10-CM | POA: Diagnosis not present

## 2024-05-12 DIAGNOSIS — Z7984 Long term (current) use of oral hypoglycemic drugs: Secondary | ICD-10-CM | POA: Insufficient documentation

## 2024-05-12 DIAGNOSIS — D6959 Other secondary thrombocytopenia: Secondary | ICD-10-CM | POA: Diagnosis not present

## 2024-05-12 DIAGNOSIS — D500 Iron deficiency anemia secondary to blood loss (chronic): Secondary | ICD-10-CM

## 2024-05-12 DIAGNOSIS — Z5111 Encounter for antineoplastic chemotherapy: Secondary | ICD-10-CM | POA: Diagnosis present

## 2024-05-12 DIAGNOSIS — Z8673 Personal history of transient ischemic attack (TIA), and cerebral infarction without residual deficits: Secondary | ICD-10-CM | POA: Diagnosis not present

## 2024-05-12 DIAGNOSIS — Z452 Encounter for adjustment and management of vascular access device: Secondary | ICD-10-CM | POA: Insufficient documentation

## 2024-05-12 DIAGNOSIS — K521 Toxic gastroenteritis and colitis: Secondary | ICD-10-CM | POA: Insufficient documentation

## 2024-05-12 DIAGNOSIS — M545 Low back pain, unspecified: Secondary | ICD-10-CM | POA: Insufficient documentation

## 2024-05-12 DIAGNOSIS — E785 Hyperlipidemia, unspecified: Secondary | ICD-10-CM

## 2024-05-12 LAB — LIPID PANEL
Cholesterol: 148 mg/dL (ref 0–200)
HDL: 34 mg/dL — ABNORMAL LOW
LDL Cholesterol: 77 mg/dL (ref 0–99)
Total CHOL/HDL Ratio: 4.3 ratio
Triglycerides: 182 mg/dL — ABNORMAL HIGH
VLDL: 36 mg/dL (ref 0–40)

## 2024-05-12 LAB — CBC WITH DIFFERENTIAL (CANCER CENTER ONLY)
Abs Immature Granulocytes: 0.04 K/uL (ref 0.00–0.07)
Basophils Absolute: 0 K/uL (ref 0.0–0.1)
Basophils Relative: 1 %
Eosinophils Absolute: 0.1 K/uL (ref 0.0–0.5)
Eosinophils Relative: 3 %
HCT: 27.3 % — ABNORMAL LOW (ref 39.0–52.0)
Hemoglobin: 9.2 g/dL — ABNORMAL LOW (ref 13.0–17.0)
Immature Granulocytes: 1 %
Lymphocytes Relative: 21 %
Lymphs Abs: 0.7 K/uL (ref 0.7–4.0)
MCH: 33.1 pg (ref 26.0–34.0)
MCHC: 33.7 g/dL (ref 30.0–36.0)
MCV: 98.2 fL (ref 80.0–100.0)
Monocytes Absolute: 0.3 K/uL (ref 0.1–1.0)
Monocytes Relative: 10 %
Neutro Abs: 2.1 K/uL (ref 1.7–7.7)
Neutrophils Relative %: 64 %
Platelet Count: 158 K/uL (ref 150–400)
RBC: 2.78 MIL/uL — ABNORMAL LOW (ref 4.22–5.81)
RDW: 19.6 % — ABNORMAL HIGH (ref 11.5–15.5)
WBC Count: 3.2 K/uL — ABNORMAL LOW (ref 4.0–10.5)
nRBC: 0.6 % — ABNORMAL HIGH (ref 0.0–0.2)

## 2024-05-12 LAB — CMP (CANCER CENTER ONLY)
ALT: 13 U/L (ref 0–44)
AST: 17 U/L (ref 15–41)
Albumin: 4.3 g/dL (ref 3.5–5.0)
Alkaline Phosphatase: 54 U/L (ref 38–126)
Anion gap: 10 (ref 5–15)
BUN: 15 mg/dL (ref 8–23)
CO2: 27 mmol/L (ref 22–32)
Calcium: 9.1 mg/dL (ref 8.9–10.3)
Chloride: 106 mmol/L (ref 98–111)
Creatinine: 0.96 mg/dL (ref 0.61–1.24)
GFR, Estimated: >60 mL/min
Glucose, Bld: 126 mg/dL — ABNORMAL HIGH (ref 70–99)
Potassium: 3.3 mmol/L — ABNORMAL LOW (ref 3.5–5.1)
Sodium: 143 mmol/L (ref 135–145)
Total Bilirubin: 1.4 mg/dL — ABNORMAL HIGH (ref 0.0–1.2)
Total Protein: 6.4 g/dL — ABNORMAL LOW (ref 6.5–8.1)

## 2024-05-12 LAB — IRON AND IRON BINDING CAPACITY (CC-WL,HP ONLY)
Iron: 83 ug/dL (ref 45–182)
Saturation Ratios: 30 % (ref 17.9–39.5)
TIBC: 273 ug/dL (ref 250–450)
UIBC: 190 ug/dL

## 2024-05-12 LAB — RETICULOCYTES
Immature Retic Fract: 19.3 % — ABNORMAL HIGH (ref 2.3–15.9)
RBC.: 2.86 MIL/uL — ABNORMAL LOW (ref 4.22–5.81)
Retic Count, Absolute: 139.6 K/uL (ref 19.0–186.0)
Retic Ct Pct: 4.9 % — ABNORMAL HIGH (ref 0.4–3.1)

## 2024-05-12 LAB — SAMPLE TO BLOOD BANK

## 2024-05-12 LAB — CEA (ACCESS): CEA (CHCC): 1.5 ng/mL (ref 0.00–5.00)

## 2024-05-12 LAB — FERRITIN: Ferritin: 955 ng/mL — ABNORMAL HIGH (ref 24–336)

## 2024-05-12 MED ORDER — SODIUM CHLORIDE 0.9 % IV SOLN
1920.0000 mg/m2 | INTRAVENOUS | Status: DC
  Administered 2024-05-12: 4150 mg via INTRAVENOUS
  Filled 2024-05-12: qty 83

## 2024-05-12 MED ORDER — PALONOSETRON HCL INJECTION 0.25 MG/5ML
0.2500 mg | Freq: Once | INTRAVENOUS | Status: AC
  Administered 2024-05-12: 0.25 mg via INTRAVENOUS
  Filled 2024-05-12: qty 5

## 2024-05-12 MED ORDER — SODIUM CHLORIDE 0.9 % IV SOLN
144.0000 mg/m2 | Freq: Once | INTRAVENOUS | Status: AC
  Administered 2024-05-12: 300 mg via INTRAVENOUS
  Filled 2024-05-12: qty 15

## 2024-05-12 MED ORDER — SODIUM CHLORIDE 0.9 % IV SOLN: INTRAVENOUS | Status: DC

## 2024-05-12 MED ORDER — FLUOROURACIL CHEMO INJECTION 2.5 GM/50ML
400.0000 mg/m2 | Freq: Once | INTRAVENOUS | Status: AC
  Administered 2024-05-12: 850 mg via INTRAVENOUS
  Filled 2024-05-12: qty 17

## 2024-05-12 MED ORDER — DEXAMETHASONE SOD PHOSPHATE PF 10 MG/ML IJ SOLN
10.0000 mg | Freq: Once | INTRAMUSCULAR | Status: AC
  Administered 2024-05-12: 10 mg via INTRAVENOUS
  Filled 2024-05-12: qty 1

## 2024-05-12 MED ORDER — SODIUM CHLORIDE 0.9 % IV SOLN
400.0000 mg/m2 | Freq: Once | INTRAVENOUS | Status: AC
  Administered 2024-05-12: 860 mg via INTRAVENOUS
  Filled 2024-05-12: qty 43

## 2024-05-12 MED ORDER — ATROPINE SULFATE 1 MG/ML IV SOLN
0.5000 mg | Freq: Once | INTRAVENOUS | Status: AC | PRN
  Administered 2024-05-12: 0.5 mg via INTRAVENOUS
  Filled 2024-05-12: qty 1

## 2024-05-12 NOTE — Patient Instructions (Signed)
 [facility] - A DEPT OF [facility]  Discharge Instructions: Thank you for choosing [facility] to provide your oncology and hematology care.   If you have a lab appointment with the Cancer Center, please go directly to the Cancer Center and check in at the registration area.  Wear comfortable clothing and clothing appropriate for easy access to any Portacath or PICC line.   We strive to give you quality time with your provider. You may need to reschedule your appointment if you arrive late (15 or more minutes).  Arriving late affects you and other patients whose appointments are after yours.  Also, if you miss three or more appointments without notifying the office, you may be dismissed from the clinic at the provider's discretion.      For prescription refill requests, have your pharmacy contact our office and allow 72 hours for refills to be completed.    Today you received the following chemotherapy and/or immunotherapy agents Irinotecan ,. Leucovorin , 5FU.      To help prevent nausea and vomiting after your treatment, we encourage you to take your nausea medication as directed.  BELOW ARE SYMPTOMS THAT SHOULD BE REPORTED IMMEDIATELY: *FEVER GREATER THAN 100.4 F (38 C) OR HIGHER *CHILLS OR SWEATING *NAUSEA AND VOMITING THAT IS NOT CONTROLLED WITH YOUR NAUSEA MEDICATION *UNUSUAL SHORTNESS OF BREATH *UNUSUAL BRUISING OR BLEEDING *URINARY PROBLEMS (pain or burning when urinating, or frequent urination) *BOWEL PROBLEMS (unusual diarrhea, constipation, pain near the anus) TENDERNESS IN MOUTH AND THROAT WITH OR WITHOUT PRESENCE OF ULCERS (sore throat, sores in mouth, or a toothache) UNUSUAL RASH, SWELLING OR PAIN  UNUSUAL VAGINAL DISCHARGE OR ITCHING   Items with * indicate a potential emergency and should be followed up as soon as possible or go to the Emergency Department if any problems should occur.  Please show the CHEMOTHERAPY ALERT CARD  or IMMUNOTHERAPY ALERT CARD at check-in to the Emergency Department and triage nurse. Should you have questions after your visit or need to cancel or reschedule your appointment, please contact [facility] - A DEPT OF [facility]  [phone] and follow the prompts.  Office hours are 8:00 a.m. to 4:30 p.m. Monday - Friday. Please note that voicemails left after 4:00 p.m. may not be returned until the following business day.  We are closed weekends and major holidays. You have access to a nurse at all times for urgent questions. Please call the main number to the clinic [phone] and follow the prompts.  For any non-urgent questions, you may also contact your provider using MyChart. We now offer e-Visits for anyone 58 and older to request care online for non-urgent symptoms. For details visit [email or web address].   Also download the MyChart app! Go to the app store, search MyChart, open the app, select [facility], and log in with your MyChart username and password.

## 2024-05-12 NOTE — Progress Notes (Signed)
 " Hematology and Oncology Follow Up Visit  John Douglas [id number] [birth year] 69 y.o. 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 Lonsurf  60 mg po BID - start on 08/05/2023 -- changed on 09/20/2023 -- d/c on 11/25/2023   Current Therapy:  FOLFIRI - started on 01/29/2024, s/p cycle 5 Eliquis  5 mg p.o. twice daily Aspirin  81 mg p.o. daily   Interim History:  John Douglas is here today for follow-up and treatment. He is doing fairly well and he is having functional lower back pain that comes and goes. He has some fatigue at times but still gets out and does what he needs to do.  CEA last visit was stable at 1.29.  He has not noted any obvious blood loss. Hgb today is 9.2.  No issue with fever, chills, n/v, cough, rash, dizziness, SOB, chest pain, palpitations, abdominal pain or changes in bowel or bladder habits.  No swelling noted in his extremities.  No falls or syncope reported.  Appetite and hydration are good. Weight is stable at 211 lbs.   ECOG Performance Status: 1 - Symptomatic but completely ambulatory  Medications:  Allergies as of 05/12/2024       Reactions   Celexa  [citalopram ] Other (See Comments)   Had a reaction to medication   Glucophage [metformin] Diarrhea, Other (See Comments)   GI bleeding Blood stool   Yellow Jacket Venom [bee Venom] Swelling, Other (See  Comments)   Severe swelling where stung   Xgeva  [denosumab ] Other (See Comments)   Runny nose and epistaxis  Lower back pain Watery discharge from eyes Raw sinuses   Statins    Significant myalgias         Medication List        Accurate as of May 12, 2024 11:23 AM. If you have any questions, ask your nurse or doctor.          Eliquis  5 MG Tabs tablet Generic drug: apixaban  TAKE 1 TABLET BY MOUTH TWICE A DAY   ezetimibe  10 MG tablet Commonly known as: ZETIA  Take 1 tablet by mouth once daily   folic acid  1 MG tablet Commonly known as: FOLVITE  TAKE 2 TABLETS BY MOUTH EVERY DAY   furosemide  20 MG tablet Commonly known as: LASIX  Take 1 tablet (20 mg total) by mouth daily.   glimepiride  1 MG tablet Commonly known as: AMARYL  Take 1 mg by mouth daily.   HYDROcodone -acetaminophen  5-325 MG tablet Commonly known as: NORCO/VICODIN Take 1 tablet by mouth every 6 (six) hours as needed.   Magnesium 200 MG Tabs Take 200 mg by mouth in the morning and at bedtime.   metoprolol  succinate 50 MG 24 hr tablet Commonly known as: TOPROL -XL Take 50 mg by mouth daily.   potassium chloride  SA 20 MEQ tablet Commonly known as: Klor-Con  M20 Take 1 tablet (20 mEq total) by mouth daily.        Allergies: Allergies[1]  Past Medical History, Surgical history, Social history, and Family History  were reviewed and updated.  Review of Systems: All other 10 point review of systems is negative.   Physical Exam:  height is 5' 10 (1.778 m) and weight is 211 lb (95.7 kg). His oral temperature is 98.4 F (36.9 C). His blood pressure is 145/86 (abnormal) and his pulse is 56 (abnormal). His respiration is 18 and oxygen saturation is 100%.   Wt Readings from Last 3 Encounters:  05/12/24 211 lb (95.7 kg)  04/27/24 209 lb (94.8 kg)  04/17/24 213 lb (96.6 kg)    Ocular: Sclerae unicteric, pupils equal, round and reactive to light Ear-nose-throat: Oropharynx clear, dentition  fair Lymphatic: No cervical or supraclavicular adenopathy Lungs no rales or rhonchi, good excursion bilaterally Heart regular rate and rhythm, no murmur appreciated Abd soft, nontender, positive bowel sounds MSK no focal spinal tenderness, no joint edema Neuro: non-focal, well-oriented, appropriate affect Breasts: Deferred   Lab Results  Component Value Date   WBC 7.4 04/27/2024   HGB 10.5 (L) 04/27/2024   HCT 31.3 (L) 04/27/2024   MCV 94.8 04/27/2024   PLT 203 04/27/2024   Lab Results  Component Value Date   FERRITIN 1,190 (H) 04/27/2024   IRON 82 04/27/2024   TIBC 273 04/27/2024   UIBC 191 04/27/2024   IRONPCTSAT 30 04/27/2024   Lab Results  Component Value Date   RETICCTPCT 4.4 (H) 03/31/2024   RBC 3.30 (L) 04/27/2024   No results found for: KPAFRELGTCHN, LAMBDASER, KAPLAMBRATIO No results found for: IGGSERUM, IGA, IGMSERUM No results found for: STEPHANY CARLOTA BENSON MARKEL EARLA JOANNIE DOC VICK, SPEI   Chemistry      Component Value Date/Time   NA 139 04/27/2024 0931   NA 140 08/28/2019 0940   K 3.6 04/27/2024 0931   CL 104 04/27/2024 0931   CO2 24 04/27/2024 0931   BUN 11 04/27/2024 0931   BUN 21 08/28/2019 0940   CREATININE 0.94 04/27/2024 0931      Component Value Date/Time   CALCIUM  9.4 04/27/2024 0931   ALKPHOS 56 04/27/2024 0931   AST 21 04/27/2024 0931   ALT 9 04/27/2024 0931   BILITOT 1.4 (H) 04/27/2024 0931       Impression and Plan: John Douglas is a pleasant 69 yo caucasian gentleman with history of CVA secondary to atrial fib and rectal bleeding. He was diagnosed with metastatic adenocarcinoma of the rectum -K-ras wild type/BRAF wild-type/HER-2 negative/MMR proficient.  He had adenopathy distant to the rectum and now has had disease recurrence (12/2023). PET scan in 03/2024 showed he is responding to treatment.  CEA pending.  We will proceed with treatment today as planned.  Iron studies are pending.  We will replace if needed.    Follow-up in 2 weeks.   Lauraine Pepper, NP 1/13/202611:23 AM     [1]  Allergies Allergen Reactions   Celexa  [Citalopram ] Other (See Comments)    Had a reaction to medication   Glucophage [Metformin] Diarrhea and Other (See Comments)    GI bleeding Blood stool   Yellow Jacket Venom [Bee Venom] Swelling and Other (See Comments)    Severe swelling where stung   Xgeva  [Denosumab ] Other (See Comments)    Runny nose and epistaxis  Lower back pain Watery discharge from eyes Raw sinuses   Statins     Significant myalgias    "

## 2024-05-12 NOTE — Patient Instructions (Signed)
 [facility] - A DEPT OF [facility]  Discharge Instructions: Thank you for choosing [facility] to provide your oncology and hematology care.   If you have a lab appointment with the Cancer Center, please go directly to the Cancer Center and check in at the registration area.  Wear comfortable clothing and clothing appropriate for easy access to any Portacath or PICC line.   We strive to give you quality time with your provider. You may need to reschedule your appointment if you arrive late (15 or more minutes).  Arriving late affects you and other patients whose appointments are after yours.  Also, if you miss three or more appointments without notifying the office, you may be dismissed from the clinic at the provider's discretion.      For prescription refill requests, have your pharmacy contact our office and allow 72 hours for refills to be completed.    Today you received the following chemotherapy and/or immunotherapy agents Irinotecan/Leucovorin/5 FU       To help prevent nausea and vomiting after your treatment, we encourage you to take your nausea medication as directed.  BELOW ARE SYMPTOMS THAT SHOULD BE REPORTED IMMEDIATELY: *FEVER GREATER THAN 100.4 F (38 C) OR HIGHER *CHILLS OR SWEATING *NAUSEA AND VOMITING THAT IS NOT CONTROLLED WITH YOUR NAUSEA MEDICATION *UNUSUAL SHORTNESS OF BREATH *UNUSUAL BRUISING OR BLEEDING *URINARY PROBLEMS (pain or burning when urinating, or frequent urination) *BOWEL PROBLEMS (unusual diarrhea, constipation, pain near the anus) TENDERNESS IN MOUTH AND THROAT WITH OR WITHOUT PRESENCE OF ULCERS (sore throat, sores in mouth, or a toothache) UNUSUAL RASH, SWELLING OR PAIN  UNUSUAL VAGINAL DISCHARGE OR ITCHING   Items with * indicate a potential emergency and should be followed up as soon as possible or go to the Emergency Department if any problems should occur.  Please show the CHEMOTHERAPY ALERT CARD  or IMMUNOTHERAPY ALERT CARD at check-in to the Emergency Department and triage nurse. Should you have questions after your visit or need to cancel or reschedule your appointment, please contact [facility] - A DEPT OF [facility]  [phone] and follow the prompts.  Office hours are 8:00 a.m. to 4:30 p.m. Monday - Friday. Please note that voicemails left after 4:00 p.m. may not be returned until the following business day.  We are closed weekends and major holidays. You have access to a nurse at all times for urgent questions. Please call the main number to the clinic [phone] and follow the prompts.  For any non-urgent questions, you may also contact your provider using MyChart. We now offer e-Visits for anyone 54 and older to request care online for non-urgent symptoms. For details visit [email or web address].   Also download the MyChart app! Go to the app store, search "MyChart", open the app, select [place], and log in with your MyChart username and password.

## 2024-05-13 ENCOUNTER — Encounter: Payer: Self-pay | Admitting: Hematology & Oncology

## 2024-05-13 NOTE — Telephone Encounter (Signed)
-----   Message from Maude Crease, MD sent at 05/12/2024  5:33 PM EST ----- Call - the CEA and iron levels are ok!!  Jeralyn

## 2024-05-13 NOTE — Telephone Encounter (Signed)
 Advised via MyChart.

## 2024-05-14 ENCOUNTER — Inpatient Hospital Stay

## 2024-05-14 DIAGNOSIS — Z5111 Encounter for antineoplastic chemotherapy: Secondary | ICD-10-CM | POA: Diagnosis not present

## 2024-05-14 NOTE — Patient Instructions (Signed)
 [facility] - A DEPT OF [facility]  Discharge Instructions: Thank you for choosing [facility] to provide your oncology and hematology care.   If you have a lab appointment with the Cancer Center, please go directly to the Cancer Center and check in at the registration area.  Wear comfortable clothing and clothing appropriate for easy access to any Portacath or PICC line.   We strive to give you quality time with your provider. You may need to reschedule your appointment if you arrive late (15 or more minutes).  Arriving late affects you and other patients whose appointments are after yours.  Also, if you miss three or more appointments without notifying the office, you may be dismissed from the clinic at the provider's discretion.      For prescription refill requests, have your pharmacy contact our office and allow 72 hours for refills to be completed.    Today you received the following chemotherapy and/or immunotherapy agents:  Irinotecan, Leucovorin and 5FU      To help prevent nausea and vomiting after your treatment, we encourage you to take your nausea medication as directed.  BELOW ARE SYMPTOMS THAT SHOULD BE REPORTED IMMEDIATELY: *FEVER GREATER THAN 100.4 F (38 C) OR HIGHER *CHILLS OR SWEATING *NAUSEA AND VOMITING THAT IS NOT CONTROLLED WITH YOUR NAUSEA MEDICATION *UNUSUAL SHORTNESS OF BREATH *UNUSUAL BRUISING OR BLEEDING *URINARY PROBLEMS (pain or burning when urinating, or frequent urination) *BOWEL PROBLEMS (unusual diarrhea, constipation, pain near the anus) TENDERNESS IN MOUTH AND THROAT WITH OR WITHOUT PRESENCE OF ULCERS (sore throat, sores in mouth, or a toothache) UNUSUAL RASH, SWELLING OR PAIN  UNUSUAL VAGINAL DISCHARGE OR ITCHING   Items with * indicate a potential emergency and should be followed up as soon as possible or go to the Emergency Department if any problems should occur.  Please show the CHEMOTHERAPY ALERT  CARD or IMMUNOTHERAPY ALERT CARD at check-in to the Emergency Department and triage nurse. Should you have questions after your visit or need to cancel or reschedule your appointment, please contact [facility] - A DEPT OF [facility]  [phone] and follow the prompts.  Office hours are 8:00 a.m. to 4:30 p.m. Monday - Friday. Please note that voicemails left after 4:00 p.m. may not be returned until the following business day.  We are closed weekends and major holidays. You have access to a nurse at all times for urgent questions. Please call the main number to the clinic [phone] and follow the prompts.  For any non-urgent questions, you may also contact your provider using MyChart. We now offer e-Visits for anyone 31 and older to request care online for non-urgent symptoms. For details visit [email or web address].   Also download the MyChart app! Go to the app store, search "MyChart", open the app, select [place], and log in with your MyChart username and password.

## 2024-05-26 ENCOUNTER — Inpatient Hospital Stay

## 2024-05-26 ENCOUNTER — Inpatient Hospital Stay: Admitting: Medical Oncology

## 2024-05-26 ENCOUNTER — Encounter: Payer: Self-pay | Admitting: Hematology & Oncology

## 2024-05-27 NOTE — Progress Notes (Unsigned)
 Patient wanted to get treated on Thursday 1/29. Ok to treat and hold 5fu pump per Dr. Timmy. 5FU pump order and pump d/c day 3 removed from cycle 7 only.   John Douglas Sou, [facility] 05/27/24 3:08 PM

## 2024-05-28 ENCOUNTER — Inpatient Hospital Stay: Admitting: Medical Oncology

## 2024-05-28 ENCOUNTER — Inpatient Hospital Stay

## 2024-05-28 VITALS — BP 141/76 | HR 100 | Temp 97.8°F | Resp 18 | Ht 70.0 in | Wt 213.0 lb

## 2024-05-28 DIAGNOSIS — Z95828 Presence of other vascular implants and grafts: Secondary | ICD-10-CM | POA: Diagnosis not present

## 2024-05-28 DIAGNOSIS — D6481 Anemia due to antineoplastic chemotherapy: Secondary | ICD-10-CM | POA: Diagnosis not present

## 2024-05-28 DIAGNOSIS — T451X5A Adverse effect of antineoplastic and immunosuppressive drugs, initial encounter: Secondary | ICD-10-CM

## 2024-05-28 DIAGNOSIS — Z5111 Encounter for antineoplastic chemotherapy: Secondary | ICD-10-CM | POA: Diagnosis not present

## 2024-05-28 DIAGNOSIS — D701 Agranulocytosis secondary to cancer chemotherapy: Secondary | ICD-10-CM | POA: Diagnosis not present

## 2024-05-28 DIAGNOSIS — D500 Iron deficiency anemia secondary to blood loss (chronic): Secondary | ICD-10-CM

## 2024-05-28 DIAGNOSIS — D6959 Other secondary thrombocytopenia: Secondary | ICD-10-CM | POA: Diagnosis not present

## 2024-05-28 DIAGNOSIS — K521 Toxic gastroenteritis and colitis: Secondary | ICD-10-CM

## 2024-05-28 DIAGNOSIS — C20 Malignant neoplasm of rectum: Secondary | ICD-10-CM

## 2024-05-28 DIAGNOSIS — K625 Hemorrhage of anus and rectum: Secondary | ICD-10-CM

## 2024-05-28 LAB — CMP (CANCER CENTER ONLY)
ALT: 14 U/L (ref 0–44)
AST: 19 U/L (ref 15–41)
Albumin: 4.3 g/dL (ref 3.5–5.0)
Alkaline Phosphatase: 63 U/L (ref 38–126)
Anion gap: 9 (ref 5–15)
BUN: 16 mg/dL (ref 8–23)
CO2: 28 mmol/L (ref 22–32)
Calcium: 9.1 mg/dL (ref 8.9–10.3)
Chloride: 105 mmol/L (ref 98–111)
Creatinine: 1.1 mg/dL (ref 0.61–1.24)
GFR, Estimated: >60 mL/min
Glucose, Bld: 160 mg/dL — ABNORMAL HIGH (ref 70–99)
Potassium: 3.6 mmol/L (ref 3.5–5.1)
Sodium: 142 mmol/L (ref 135–145)
Total Bilirubin: 1.3 mg/dL — ABNORMAL HIGH (ref 0.0–1.2)
Total Protein: 6.4 g/dL — ABNORMAL LOW (ref 6.5–8.1)

## 2024-05-28 LAB — CBC WITH DIFFERENTIAL (CANCER CENTER ONLY)
Abs Immature Granulocytes: 0.04 10*3/uL (ref 0.00–0.07)
Basophils Absolute: 0 10*3/uL (ref 0.0–0.1)
Basophils Relative: 1 %
Eosinophils Absolute: 0.1 10*3/uL (ref 0.0–0.5)
Eosinophils Relative: 2 %
HCT: 26.3 % — ABNORMAL LOW (ref 39.0–52.0)
Hemoglobin: 8.7 g/dL — ABNORMAL LOW (ref 13.0–17.0)
Immature Granulocytes: 2 %
Lymphocytes Relative: 29 %
Lymphs Abs: 0.6 10*3/uL — ABNORMAL LOW (ref 0.7–4.0)
MCH: 33 pg (ref 26.0–34.0)
MCHC: 33.1 g/dL (ref 30.0–36.0)
MCV: 99.6 fL (ref 80.0–100.0)
Monocytes Absolute: 0.3 10*3/uL (ref 0.1–1.0)
Monocytes Relative: 16 %
Neutro Abs: 1.1 10*3/uL — ABNORMAL LOW (ref 1.7–7.7)
Neutrophils Relative %: 50 %
Platelet Count: 164 10*3/uL (ref 150–400)
RBC: 2.64 MIL/uL — ABNORMAL LOW (ref 4.22–5.81)
RDW: 20.5 % — ABNORMAL HIGH (ref 11.5–15.5)
WBC Count: 2.1 10*3/uL — ABNORMAL LOW (ref 4.0–10.5)
nRBC: 1 % — ABNORMAL HIGH (ref 0.0–0.2)

## 2024-05-28 LAB — IRON AND IRON BINDING CAPACITY (CC-WL,HP ONLY)
Iron: 113 ug/dL (ref 45–182)
Saturation Ratios: 40 % — ABNORMAL HIGH (ref 17.9–39.5)
TIBC: 284 ug/dL (ref 250–450)
UIBC: 171 ug/dL

## 2024-05-28 LAB — FERRITIN: Ferritin: 1029 ng/mL — ABNORMAL HIGH (ref 24–336)

## 2024-05-28 LAB — LACTATE DEHYDROGENASE: LDH: 264 U/L — ABNORMAL HIGH (ref 105–235)

## 2024-05-28 LAB — RETICULOCYTES
Immature Retic Fract: 26.2 % — ABNORMAL HIGH (ref 2.3–15.9)
RBC.: 2.63 MIL/uL — ABNORMAL LOW (ref 4.22–5.81)
Retic Count, Absolute: 156.7 10*3/uL (ref 19.0–186.0)
Retic Ct Pct: 6 % — ABNORMAL HIGH (ref 0.4–3.1)

## 2024-05-28 LAB — SAMPLE TO BLOOD BANK

## 2024-05-28 LAB — CEA (ACCESS): CEA (CHCC): 1.42 ng/mL (ref 0.00–5.00)

## 2024-05-28 LAB — PREPARE RBC (CROSSMATCH)

## 2024-05-28 NOTE — Patient Instructions (Signed)

## 2024-05-28 NOTE — Progress Notes (Signed)
 " Hematology and Oncology Follow Up Visit  John Douglas [id number] [birth year] 69 y.o. 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 Lonsurf  60 mg po BID - start on 08/05/2023 -- changed on 09/20/2023 -- d/c on 11/25/2023   Current Therapy:  FOLFIRI - started on 01/29/2024, s/p cycle 6 Eliquis  5 mg p.o. twice daily Aspirin  81 mg p.o. daily   Interim History:  John Douglas is here today for follow-up and treatment.   Today he says that he is doing well. He has no concerns.  His chronic functional lower back pain that comes and goes is stable.  Chronic fatigue is stable as well  CEA last visit was 1.5 He has not noted any obvious blood Douglas. Today his Hgb is down slightly to 8.7 from 9.2 two weeks prior.  No issue with fever, chills, n/v, cough, rash, dizziness, SOB, chest pain, palpitations, abdominal pain or changes in bowel or bladder habits.  No swelling noted in his extremities.  No falls or syncope reported.  Appetite and hydration are good.  Wt Readings from Last 3 Encounters:  05/28/24 213 lb (96.6 kg)  05/14/24 211 lb (95.7 kg)  05/12/24 211 lb (95.7 kg)     ECOG Performance Status: 1 - Symptomatic but completely ambulatory  Medications:  Allergies as of 05/28/2024       Reactions   Celexa  [citalopram ] Other (See Comments)   Had a reaction to medication   Glucophage  [metformin] Douglas, Other (See Comments)   GI Douglas Blood stool   Yellow Jacket Venom [bee Venom] Swelling, Other (See Comments)   Severe swelling where stung   Xgeva  [denosumab ] Other (See Comments)   Runny nose and epistaxis  Lower back pain Watery discharge from eyes Raw sinuses   Statins    Significant myalgias         Medication List        Accurate as of May 28, 2024 10:47 AM. If you have any questions, ask your nurse or doctor.          Eliquis  5 MG Tabs tablet Generic drug: apixaban  TAKE 1 TABLET BY MOUTH TWICE A DAY   ezetimibe  10 MG tablet Commonly known as: ZETIA  Take 1 tablet by mouth once daily   folic acid  1 MG tablet Commonly known as: FOLVITE  TAKE 2 TABLETS BY MOUTH EVERY DAY   furosemide  20 MG tablet Commonly known as: LASIX  Take 1 tablet (20 mg total) by mouth daily.   glimepiride  1 MG tablet Commonly known as: AMARYL  Take 1 mg by mouth daily.   HYDROcodone -acetaminophen  5-325 MG tablet Commonly known as: NORCO/VICODIN Take 1 tablet by mouth every 6 (six) hours as needed.   Magnesium 200 MG Tabs Take 200 mg by mouth in the morning and at bedtime.   metoprolol  succinate 50 MG 24 hr tablet Commonly known as: TOPROL -XL Take 50 mg by mouth daily.   potassium chloride  SA 20 MEQ tablet Commonly known as: Klor-Con  M20 Take 1  tablet (20 mEq total) by mouth daily.        Allergies: Allergies[1]  Past Medical History, Surgical history, Social history, and Family History were reviewed and updated.  Review of Systems: All other 10 point review of systems is negative.   Physical Exam:  height is 5' 10 (1.778 m) and weight is 213 lb (96.6 kg). His oral temperature is 97.8 F (36.6 C). His blood pressure is 141/76 (abnormal) and his pulse is 100. His respiration is 18 and oxygen saturation is 99%.   Wt Readings from Last 3 Encounters:  05/28/24 213 lb (96.6 kg)  05/14/24 211 lb (95.7 kg)  05/12/24 211 lb (95.7 kg)     Ocular: Sclerae unicteric, pupils equal, round and reactive to light Ear-nose-throat: Oropharynx clear, dentition fair Lymphatic: No cervical or supraclavicular adenopathy Lungs no rales or rhonchi, good excursion bilaterally Heart regular rate and rhythm, no murmur appreciated Abd soft, nontender, positive bowel sounds MSK no focal spinal tenderness, no joint edema Neuro: non-focal, well-oriented, appropriate affect  Lab Results  Component Value Date   WBC 2.1 (L) 05/28/2024   HGB 8.7 (L) 05/28/2024   HCT 26.3 (L) 05/28/2024   MCV 99.6 05/28/2024   PLT 164 05/28/2024   Lab Results  Component Value Date   FERRITIN 955 (H) 05/12/2024   John 83 05/12/2024   TIBC 273 05/12/2024   UIBC 190 05/12/2024   IRONPCTSAT 30 05/12/2024   Lab Results  Component Value Date   RETICCTPCT 6.0 (H) 05/28/2024   RBC 2.64 (L) 05/28/2024   RBC 2.63 (L) 05/28/2024   No results found for: KPAFRELGTCHN, LAMBDASER, KAPLAMBRATIO No results found for: IGGSERUM, IGA, IGMSERUM No results found for: STEPHANY CARLOTA BENSON MARKEL EARLA JOANNIE DOC, MSPIKE, SPEI   Chemistry      Component Value Date/Time   NA 142 05/28/2024 1000   NA 140 08/28/2019 0940   K 3.6 05/28/2024 1000   CL 105 05/28/2024 1000   CO2 28 05/28/2024 1000   BUN 16 05/28/2024 1000   BUN 21 08/28/2019 0940   CREATININE 1.10 05/28/2024 1000      Component Value Date/Time   CALCIUM  9.1 05/28/2024 1000   ALKPHOS 63 05/28/2024 1000   AST 19 05/28/2024 1000   ALT 14 05/28/2024 1000   BILITOT 1.3 (H) 05/28/2024 1000      Encounter Diagnoses  Name Primary?   John Douglas (HCC) Yes   John Douglas    John Douglas    John Douglas    John Douglas    John Douglas    John Douglas    John Douglas     John Douglas: John Douglas  is a pleasant 69 yo caucasian gentleman with history of CVA secondary to atrial fib and has a history of John Douglas. He was diagnosed with metastatic adenocarcinoma of the rectum -K-ras wild type/BRAF wild-type/HER-2 negative/MMR proficient. S/p cycle 6 of FOLFIRI  He had adenopathy distant to the rectum and now has had disease recurrence (12/2023). PET scan in 03/2024 showed he is responding to current treatment.  CEA pending today CBC show mild decline in Hgb with a value of 8.6. Given his history of severe John following Douglas I discussed his labs and case with Dr. Timmy and we are electing to HOLD his TX today and have him return tomorrow for 1 unit of blood. We will retry for treatment in 2 weeks.  John studies are pending. We will replace if needed.     RTC tomorrow for 1 unit of blood RTC 2 weeks MD, port labs, Cycle 7 FOLFIRI  Lauraine HERO [place], PA-C 1/29/202610:47 AM     [1]  Allergies Allergen Reactions   Celexa  [Citalopram ] Other (See Comments)    Had a reaction to medication   Glucophage [Metformin] Douglas and Other (See Comments)    GI Douglas Blood stool   Yellow Jacket Venom [Bee Venom] Swelling and Other (See Comments)    Severe swelling where stung   Xgeva  [Denosumab ] Other (See Comments)    Runny nose and epistaxis  Lower back pain Watery discharge from eyes Raw sinuses   Statins     Significant myalgias    "

## 2024-05-29 ENCOUNTER — Inpatient Hospital Stay

## 2024-05-29 ENCOUNTER — Telehealth: Payer: Self-pay

## 2024-05-29 DIAGNOSIS — Z5111 Encounter for antineoplastic chemotherapy: Secondary | ICD-10-CM | POA: Diagnosis not present

## 2024-05-29 DIAGNOSIS — C20 Malignant neoplasm of rectum: Secondary | ICD-10-CM

## 2024-05-29 MED ORDER — SODIUM CHLORIDE 0.9% IV SOLUTION
250.0000 mL | INTRAVENOUS | Status: DC
  Administered 2024-05-29: 250 mL via INTRAVENOUS

## 2024-05-29 MED ORDER — ACETAMINOPHEN 325 MG PO TABS
650.0000 mg | ORAL_TABLET | Freq: Once | ORAL | Status: AC
  Administered 2024-05-29: 650 mg via ORAL
  Filled 2024-05-29: qty 2

## 2024-05-29 MED ORDER — DIPHENHYDRAMINE HCL 25 MG PO CAPS
25.0000 mg | ORAL_CAPSULE | Freq: Once | ORAL | Status: AC
  Administered 2024-05-29: 25 mg via ORAL
  Filled 2024-05-29: qty 1

## 2024-05-29 NOTE — Telephone Encounter (Signed)
 Pt calling scheduling stating that he is supposed to get an infusion on Tuesday 06/02/2024 after having treatment held this week. Pt adamant with scheduling staff. This RN called patient to educate patient that during his MD appointment Lauraine Dais, PA did tell him that treatment was being held for 2 weeks per direction of Dr. Maude Crease. Pt educated that it wasn't just his hemoglobin that held treatment but his ANC was lower than 1.5 pt educated that he can get treatment in 2 weeks when his counts have increased. Pt verbalized understanding and had no further questions. Pt transferred to scheduling to get scheduled. No further needs identified.

## 2024-05-29 NOTE — Patient Instructions (Signed)
 Getting Blood Through an IV (Blood Transfusion) in Adults: What to Expect A blood transfusion is a procedure in which you receive blood through an IV tube. You may need this procedure because of: A bleeding disorder. An illness. An injury. A surgery. The blood may come from someone else (a donor). You may also be able to donate blood for yourself before a surgery. The blood given in a transfusion may be made up of different types of cells. You may get: Red blood cells. These carry oxygen to the cells in the body. Platelets. These help your blood to clot. Plasma. This is the liquid part of your blood. It carries proteins and other substances through the body. White blood cells. These help you fight infections. If you have a clotting disorder, you may also get other types of blood products. Depending on the type of blood product, this procedure may take 1-4 hours to complete. Tell your doctor about: Any bleeding problems you have. Any reactions you have had during a blood transfusion in the past. Any allergies you have. All medicines you are taking, including vitamins, herbs, eye drops, creams, and over-the-counter medicines. Any surgeries you have had. Any medical conditions you have. Whether you are pregnant or may be pregnant. What are the risks? Talk with your health care provider about risks. The most common problems include: A mild allergic reaction. This includes red, swollen areas of skin (hives) and itching. Fever or chills. This may be the body's response to new blood cells received. This may happen during or up to 4 hours after the transfusion. More serious problems may include: A serious allergic reaction. This includes breathing trouble or swelling around the face and lips. Too much fluid in the lungs. This may cause breathing problems. Lung injury. This causes breathing trouble and low oxygen in the blood. This can happen within hours of the transfusion or days later. Too  much iron. This can happen after getting many blood transfusions over a period of time. An infection or virus passed through the blood. This is rare. Donated blood is carefully tested before it is given. Your body's defense system (immune system) trying to attack the new blood cells. This is rare. Symptoms may include fever, chills, nausea, low blood pressure, and low back or chest pain. Donated cells attacking healthy tissues. This is rare. What happens before the procedure? You will have a blood test to find out your blood type. The test also finds out what type of blood your body will accept and matches it to the donor type. If you are going to have a planned surgery, you may be able to donate your own blood. This may be done in case you need a transfusion. You will have your temperature, blood pressure, and pulse checked. You may receive medicine to help prevent an allergic reaction. This may be done if you have had a reaction to a transfusion before. This medicine may be given to you by mouth or through an IV tube. What happens during the procedure?  An IV tube will be put into one of your veins. The bag of blood will be attached to your IV tube. Then, the blood will enter through your vein. Your temperature, blood pressure, and pulse will be checked often. This is done to find early signs of a transfusion reaction. Tell your nurse right away if you have any of these symptoms: Shortness of breath or trouble breathing. Chest or back pain. Fever or chills. Red, swollen areas  of skin or itching. If you have any signs or symptoms of a reaction, your transfusion will be stopped. You may also be given medicine. When the transfusion is finished, your IV tube will be taken out. Pressure may be put on the IV site for a few minutes. A bandage (dressing) will be put on the IV site. The procedure may vary among doctors and hospitals. What happens after the procedure? You will be monitored until you  leave the hospital or clinic. This includes checking your temperature, blood pressure, pulse, breathing rate, and blood oxygen level. Your blood may be tested to see how you have responded to the transfusion. You may be warmed with fluids or blankets. This is done to keep the temperature of your body normal. If you have your procedure in an outpatient setting, you will be told whom to contact to report any reactions. Where to find more information Visit the [employer]: redcross.org Summary A blood transfusion is a procedure in which you receive blood through an IV tube. The blood you are given may be made up of different blood cells. You may receive red blood cells, platelets, plasma, or white blood cells. Your temperature, blood pressure, and pulse will be checked often. After the procedure, your blood may be tested to see how you have responded. This information is not intended to replace advice given to you by your health care provider. Make sure you discuss any questions you have with your health care provider. Document Revised: 02/20/2024 Document Reviewed: 07/14/2021 Elsevier Patient Education  2025 [employer].

## 2024-06-01 LAB — TYPE AND SCREEN
ABO/RH(D): O POS
Antibody Screen: NEGATIVE
Unit division: 0

## 2024-06-01 LAB — BPAM RBC
Blood Product Expiration Date: 202602282359
ISSUE DATE / TIME: 202601300740
Unit Type and Rh: 5100

## 2024-06-02 ENCOUNTER — Other Ambulatory Visit: Payer: Self-pay | Admitting: *Deleted

## 2024-06-02 ENCOUNTER — Inpatient Hospital Stay

## 2024-06-02 MED ORDER — HYDROCODONE-ACETAMINOPHEN 5-325 MG PO TABS: 1.0000 | ORAL_TABLET | Freq: Four times a day (QID) | ORAL | 0 refills | Status: AC | PRN

## 2024-06-03 ENCOUNTER — Ambulatory Visit: Payer: Self-pay | Admitting: Medical Oncology

## 2024-06-08 ENCOUNTER — Inpatient Hospital Stay: Admitting: Hematology & Oncology

## 2024-06-08 ENCOUNTER — Inpatient Hospital Stay: Attending: Hematology & Oncology

## 2024-06-08 ENCOUNTER — Inpatient Hospital Stay

## 2024-06-08 DIAGNOSIS — C20 Malignant neoplasm of rectum: Secondary | ICD-10-CM

## 2024-06-10 ENCOUNTER — Inpatient Hospital Stay

## 2024-06-22 ENCOUNTER — Inpatient Hospital Stay

## 2024-06-22 ENCOUNTER — Inpatient Hospital Stay: Admitting: Hematology & Oncology

## 2024-06-24 ENCOUNTER — Inpatient Hospital Stay

## 2024-06-30 ENCOUNTER — Inpatient Hospital Stay

## 2024-07-06 ENCOUNTER — Inpatient Hospital Stay: Attending: Hematology & Oncology

## 2024-07-06 ENCOUNTER — Inpatient Hospital Stay

## 2024-07-06 ENCOUNTER — Inpatient Hospital Stay: Admitting: Hematology & Oncology

## 2024-07-08 ENCOUNTER — Inpatient Hospital Stay

## 2024-10-16 ENCOUNTER — Ambulatory Visit: Admitting: Nurse Practitioner
